# Patient Record
Sex: Male | Born: 1955
Health system: Southern US, Community
[De-identification: ages and names within clinical notes are randomized; demographics above are authoritative.]

## PROBLEM LIST (undated history)

## (undated) DIAGNOSIS — I314 Cardiac tamponade: Secondary | ICD-10-CM

## (undated) DIAGNOSIS — E781 Pure hyperglyceridemia: Secondary | ICD-10-CM

## (undated) DIAGNOSIS — I77810 Thoracic aortic ectasia: Secondary | ICD-10-CM

## (undated) DIAGNOSIS — I2510 Atherosclerotic heart disease of native coronary artery without angina pectoris: Secondary | ICD-10-CM

## (undated) DIAGNOSIS — I10 Essential (primary) hypertension: Secondary | ICD-10-CM

## (undated) DIAGNOSIS — Z87442 Personal history of urinary calculi: Secondary | ICD-10-CM

## (undated) DIAGNOSIS — I351 Nonrheumatic aortic (valve) insufficiency: Secondary | ICD-10-CM

## (undated) DIAGNOSIS — E039 Hypothyroidism, unspecified: Secondary | ICD-10-CM

## (undated) DIAGNOSIS — Q2381 Bicuspid aortic valve: Secondary | ICD-10-CM

## (undated) DIAGNOSIS — T7840XA Allergy, unspecified, initial encounter: Secondary | ICD-10-CM

## (undated) DIAGNOSIS — Z973 Presence of spectacles and contact lenses: Secondary | ICD-10-CM

## (undated) DIAGNOSIS — R011 Cardiac murmur, unspecified: Secondary | ICD-10-CM

## (undated) DIAGNOSIS — Q231 Congenital insufficiency of aortic valve: Secondary | ICD-10-CM

## (undated) DIAGNOSIS — K219 Gastro-esophageal reflux disease without esophagitis: Secondary | ICD-10-CM

## (undated) DIAGNOSIS — E079 Disorder of thyroid, unspecified: Secondary | ICD-10-CM

## (undated) DIAGNOSIS — N400 Enlarged prostate without lower urinary tract symptoms: Secondary | ICD-10-CM

## (undated) HISTORY — DX: Allergy, unspecified, initial encounter: T78.40XA

## (undated) HISTORY — DX: Atherosclerotic heart disease of native coronary artery without angina pectoris: I25.10

## (undated) HISTORY — DX: Essential (primary) hypertension: I10

## (undated) HISTORY — DX: Benign prostatic hyperplasia without lower urinary tract symptoms: N40.0

## (undated) HISTORY — DX: Disorder of thyroid, unspecified: E07.9

## (undated) HISTORY — DX: Congenital insufficiency of aortic valve: Q23.1

## (undated) HISTORY — DX: Thoracic aortic ectasia: I77.810

## (undated) HISTORY — DX: Pure hyperglyceridemia: E78.1

## (undated) HISTORY — DX: Nonrheumatic aortic (valve) insufficiency: I35.1

## (undated) HISTORY — DX: Bicuspid aortic valve: Q23.81

## (undated) HISTORY — PX: EYE SURGERY: SHX253

---
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
## 1995-11-27 HISTORY — PX: VEIN LIGATION AND STRIPPING: SHX2653

## 2004-11-26 HISTORY — PX: CARDIAC CATHETERIZATION: SHX172

## 2004-11-26 HISTORY — PX: CARDIOVASCULAR STRESS TEST: SHX262

## 2004-11-26 HISTORY — PX: CHOLECYSTECTOMY: SHX55

## 2006-12-27 ENCOUNTER — Ambulatory Visit: Payer: Self-pay | Admitting: Internal Medicine

## 2006-12-27 LAB — CONVERTED CEMR LAB
Free T4: 0.6 ng/dL (ref 0.6–1.6)
Glucose, Bld: 86 mg/dL (ref 70–99)
PSA: 0.31 ng/mL (ref 0.10–4.00)
TSH: 8.09 microintl units/mL — ABNORMAL HIGH (ref 0.35–5.50)
Total CHOL/HDL Ratio: 8.5

## 2007-01-23 ENCOUNTER — Ambulatory Visit: Payer: Self-pay | Admitting: Internal Medicine

## 2008-01-12 ENCOUNTER — Encounter (INDEPENDENT_AMBULATORY_CARE_PROVIDER_SITE_OTHER): Payer: Self-pay | Admitting: *Deleted

## 2008-04-14 ENCOUNTER — Ambulatory Visit: Payer: Self-pay | Admitting: Internal Medicine

## 2008-04-14 DIAGNOSIS — E039 Hypothyroidism, unspecified: Secondary | ICD-10-CM | POA: Insufficient documentation

## 2008-04-16 LAB — CONVERTED CEMR LAB
ALT: 24 units/L (ref 0–53)
AST: 30 units/L (ref 0–37)
Albumin: 4.5 g/dL (ref 3.5–5.2)
Basophils Relative: 0.7 % (ref 0.0–1.0)
Calcium: 9.5 mg/dL (ref 8.4–10.5)
Cholesterol: 264 mg/dL (ref 0–200)
Direct LDL: 106.8 mg/dL
Free T4: 0.9 ng/dL (ref 0.6–1.6)
GFR calc Af Amer: 131 mL/min
GFR calc non Af Amer: 108 mL/min
HCT: 44.9 % (ref 39.0–52.0)
Hemoglobin: 15.2 g/dL (ref 13.0–17.0)
Lymphocytes Relative: 37.9 % (ref 12.0–46.0)
Monocytes Absolute: 0.7 10*3/uL (ref 0.1–1.0)
Monocytes Relative: 9.1 % (ref 3.0–12.0)
Neutro Abs: 3.4 10*3/uL (ref 1.4–7.7)
Phosphorus: 3.5 mg/dL (ref 2.3–4.6)
Potassium: 4.1 meq/L (ref 3.5–5.1)
RBC: 4.66 M/uL (ref 4.22–5.81)
RDW: 13.2 % (ref 11.5–14.6)
Sodium: 140 meq/L (ref 135–145)
Total Bilirubin: 0.9 mg/dL (ref 0.3–1.2)
Total CHOL/HDL Ratio: 8.1
Total Protein: 7.7 g/dL (ref 6.0–8.3)

## 2008-08-25 ENCOUNTER — Telehealth: Payer: Self-pay | Admitting: Internal Medicine

## 2009-04-27 ENCOUNTER — Ambulatory Visit: Payer: Self-pay | Admitting: Internal Medicine

## 2009-04-27 DIAGNOSIS — J309 Allergic rhinitis, unspecified: Secondary | ICD-10-CM

## 2009-04-28 LAB — CONVERTED CEMR LAB
Albumin: 4.1 g/dL (ref 3.5–5.2)
Alkaline Phosphatase: 64 units/L (ref 39–117)
BUN: 15 mg/dL (ref 6–23)
Basophils Relative: 0.1 % (ref 0.0–3.0)
Bilirubin, Direct: 0 mg/dL (ref 0.0–0.3)
Calcium: 9.4 mg/dL (ref 8.4–10.5)
Chloride: 112 meq/L (ref 96–112)
Eosinophils Absolute: 0.4 10*3/uL (ref 0.0–0.7)
Eosinophils Relative: 7.8 % — ABNORMAL HIGH (ref 0.0–5.0)
Free T4: 0.9 ng/dL (ref 0.6–1.6)
Glucose, Bld: 89 mg/dL (ref 70–99)
HDL: 29 mg/dL — ABNORMAL LOW (ref >39.00)
Hemoglobin: 14.4 g/dL (ref 13.0–17.0)
MCHC: 33.7 g/dL (ref 30.0–36.0)
MCV: 96 fL (ref 78.0–100.0)
Monocytes Absolute: 0.5 10*3/uL (ref 0.1–1.0)
Neutro Abs: 2.3 10*3/uL (ref 1.4–7.7)
Neutrophils Relative %: 42.5 % — ABNORMAL LOW (ref 43.0–77.0)
Potassium: 4.5 meq/L (ref 3.5–5.1)
RBC: 4.46 M/uL (ref 4.22–5.81)
Total Bilirubin: 1.1 mg/dL (ref 0.3–1.2)
VLDL: 67.8 mg/dL — ABNORMAL HIGH (ref 0.0–40.0)
WBC: 5.4 10*3/uL (ref 4.5–10.5)

## 2009-06-06 ENCOUNTER — Encounter (INDEPENDENT_AMBULATORY_CARE_PROVIDER_SITE_OTHER): Payer: Self-pay | Admitting: *Deleted

## 2010-03-03 ENCOUNTER — Ambulatory Visit: Payer: Self-pay | Admitting: Internal Medicine

## 2010-03-03 DIAGNOSIS — M259 Joint disorder, unspecified: Secondary | ICD-10-CM

## 2010-04-10 ENCOUNTER — Encounter: Payer: Self-pay | Admitting: Internal Medicine

## 2010-05-08 ENCOUNTER — Encounter: Payer: Self-pay | Admitting: Internal Medicine

## 2010-05-09 ENCOUNTER — Ambulatory Visit: Payer: Self-pay | Admitting: Internal Medicine

## 2010-05-10 LAB — CONVERTED CEMR LAB
BUN: 16 mg/dL (ref 6–23)
Basophils Relative: 0.5 % (ref 0.0–3.0)
Bilirubin, Direct: 0.1 mg/dL (ref 0.0–0.3)
Calcium: 8.9 mg/dL (ref 8.4–10.5)
Creatinine, Ser: 0.9 mg/dL (ref 0.4–1.5)
Eosinophils Relative: 6.7 % — ABNORMAL HIGH (ref 0.0–5.0)
HCT: 38.3 % — ABNORMAL LOW (ref 39.0–52.0)
HDL: 29.2 mg/dL — ABNORMAL LOW (ref >39.00)
Hemoglobin: 13.1 g/dL (ref 13.0–17.0)
Lymphs Abs: 2.3 10*3/uL (ref 0.7–4.0)
MCV: 94.1 fL (ref 78.0–100.0)
Monocytes Absolute: 0.5 10*3/uL (ref 0.1–1.0)
Neutro Abs: 2.5 10*3/uL (ref 1.4–7.7)
PSA: 0.41 ng/mL (ref 0.10–4.00)
Phosphorus: 3.8 mg/dL (ref 2.3–4.6)
Potassium: 4.3 meq/L (ref 3.5–5.1)
RBC: 4.07 M/uL — ABNORMAL LOW (ref 4.22–5.81)
Total Bilirubin: 0.7 mg/dL (ref 0.3–1.2)
Total CHOL/HDL Ratio: 6
Triglycerides: 205 mg/dL — ABNORMAL HIGH (ref 0.0–149.0)
WBC: 5.7 10*3/uL (ref 4.5–10.5)

## 2010-08-15 ENCOUNTER — Ambulatory Visit: Payer: Self-pay | Admitting: Internal Medicine

## 2010-08-15 DIAGNOSIS — J4520 Mild intermittent asthma, uncomplicated: Secondary | ICD-10-CM

## 2010-12-26 NOTE — Assessment & Plan Note (Signed)
Summary: RE-ACURRING COUGH AND HEAD COLD/JRR   Vital Signs:  Patient profile:   55 year old male Weight:      154 pounds Temp:     98.4 degrees F oral Pulse rate:   52 / minute Pulse rhythm:   regular Resp:     12 per minute BP sitting:   122 / 70  (left arm) Cuff size:   large  Vitals Entered By: Mervin Hack CMA Duncan Dull) (August 15, 2010 10:27 AM) CC: cough   History of Present Illness: Had cold about 2 weeks ago exacerbated asthma he used to have in the past still has the cold "stuck" mostly at night coughing all night has tried delsym and albuterol inhaler  seems to be susceptible in fall every year was hospitalized in this season  ~20 years ago ? waling pneumonia then  No fever No sig sweats or chills at night No sig SOB  Antihistamines have never helped him  Allergies: 1)  ! Tetracycline Hcl (Tetracycline Hcl)  Past History:  Past medical, surgical, family and social histories (including risk factors) reviewed for relevance to current acute and chronic problems.  Past Medical History: Hyperlipidemia Hypothyroidism Allergic rhinitis Asthma--mostly in childhood  Past Surgical History: Reviewed history from 04/14/2008 and no changes required.  ~1997 Vein stripping 2006 Cholecystectomy 2006 Stress test and cath [facility])  Family History: Reviewed history from 04/14/2008 and no changes required. Dad died @79  Alcoholism?, prostate cancer Mom is alcoholic 1 brother, 1 sister Malen Gauze ?liver cancer No CAD, HTN, DM No colon cancer  Social History: Reviewed history from 04/14/2008 and no changes required. 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   Ears:  R ear normal and L ear normal.     Nose:  moderate pale congestion on right Mouth:  no erythema, no exudates, and no lesions.   Neck:  supple, no masses, and no cervical lymphadenopathy.   Lungs:  normal respiratory effort, no intercostal retractions, no accessory muscle use, normal breath sounds, no dullness, no crackles, and no wheezes.     Impression & Recommendations:  Problem # 1:  COUGH (ICD-786.2) Assessment New seems to be the remnants of mild exacerbation of asthma with his cold CXR looks fine no evidence of ongoing infection so no antibiotics needed will treat with prednisone consider inhaled steroid if symtoms persist  Orders: Spirometry w/Graph (94010) T-2 View CXR (71020TC)  Problem # 2:  ASTHMA (ICD-493.90) Assessment: Comment Only still mild and intermittent this is first exacerbation in some time His updated medication list for this problem includes:    Proair Hfa 108 (90 Base) Mcg/act Aers (Albuterol sulfate) .Marland Kitchen... 2 puffs three times a day as needed for asthma flare    Prednisone 20 Mg Tabs (Prednisone) .Marland Kitchen... 2 tabs daily for 5 days then 1 tab daily for 5 days for asthma  Complete Medication List: 1)  Levothyroxine Sodium 125 Mcg Tabs (Levothyroxine sodium) .... Take one by mouth daily 2)  Proair Hfa 108 (90 Base) Mcg/act Aers (Albuterol sulfate) .... 2 puffs three times a day as needed for asthma flare 3)  Prednisone 20 Mg Tabs (Prednisone) .... 2 tabs daily for 5 days then 1 tab daily for 5 days for asthma 4)  Tramadol Hcl 50  Mg Tabs (Tramadol hcl) .Marland Kitchen.. 1 tab at  bedtime as needed for cough  Patient Instructions: 1)  Please schedule a follow-up appointment as needed .  Prescriptions: TRAMADOL HCL 50 MG TABS (TRAMADOL HCL) 1 tab at  bedtime as needed for cough  #30 x 0   Entered and Authorized by:   Cindee Salt MD   Signed by:   Cindee Salt MD on 08/15/2010   Method used:   Electronically to        [facility]* (retail)       [address]       [place], [place]   [address]       Ph: [phone]       Fax: [phone]   RxID:   [id number] PREDNISONE 20 MG TABS (PREDNISONE) 2 tabs daily for 5 days then 1 tab daily for 5 days for asthma  #15 x 0   Entered and Authorized by:   Cindee Salt MD   Signed by:   Cindee Salt MD on 08/15/2010   Method used:   Electronically to        [facility]* (retail)       [address]       [place], [place]  [address]       Ph: [phone]       Fax: [phone]   RxID:   [id number] PROAIR HFA 108 (90 BASE) MCG/ACT AERS (ALBUTEROL SULFATE) 2 puffs three times a day as needed for asthma flare  #1 x 1   Entered and Authorized by:   Cindee Salt MD   Signed by:   Cindee Salt MD on 08/15/2010   Method used:   Electronically to        [facility]* (retail)       [address]       [place], [place]  [address]       Ph: [phone]       Fax: [phone]   RxID:   [id number]   Current Allergies (reviewed today): ! TETRACYCLINE HCL (TETRACYCLINE HCL)

## 2010-12-26 NOTE — Consult Note (Signed)
Summary: [facility]   Imported By: Lanelle Bal 04/21/2010 11:48:03  _____________________________________________________________________  External Attachment:    Type:   Image     Comment:   External Document  Appended Document: [facility] inclusion cyst right foot pain resolved so holding off on surgery

## 2010-12-26 NOTE — Assessment & Plan Note (Signed)
Summary: CPX / LFW   Vital Signs:  Patient profile:   55 year old male Weight:      161 pounds Temp:     98 degrees F oral Pulse rate:   56 / minute Pulse rhythm:   regular BP sitting:   128 / 70  (left arm) Cuff size:   large  Vitals Entered By: Mervin Hack CMA Duncan Dull) (May 09, 2010 9:28 AM) CC: adult physical   History of Present Illness: Saw podiatrist about his foot just a cyst No symptoms so just observing  Occ notes some night sweats Wife notices, he doesn't really notice Enjoys the hot weather--no heat intolerance    Allergies: 1)  ! Tetracycline Hcl (Tetracycline Hcl)  Past History:  Past medical, surgical, family and social histories (including risk factors) reviewed for relevance to current acute and chronic problems.  Past Medical History: Reviewed history from 04/27/2009 and no changes required. Hyperlipidemia Hypothyroidism Allergic rhinitis  Past Surgical History: Reviewed history from 04/14/2008 and no changes required.  ~1997 Vein stripping 2006 Cholecystectomy 2006 Stress test and cath [facility])  Family History: Reviewed history from 04/14/2008 and no changes required. Dad died @79  Alcoholism?, prostate cancer Mom is alcoholic 1 brother, 1 sister Malen Gauze ?liver cancer No CAD, HTN, DM No colon cancer  Social History: Reviewed history from 04/14/2008 and no changes required. 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 Eyes:  Denies double vision and vision loss-1 eye; just had eye exam wtih Dr Leonarda Salon. ENT:  Complains of decreased hearing; denies ringing in ears; mild high freq hearing loss teeth oare okay--keeps up with dentist. Has gotten some gum grafts. CV:  Denies chest  pain or discomfort, difficulty breathing at night, difficulty breathing while lying down, fainting, lightheadness, palpitations, and shortness of breath with exertion. Resp:  Denies cough and shortness of breath. GI:  Denies abdominal pain, bloody stools, change in bowel habits, dark tarry stools, indigestion, nausea, and vomiting. GU:  Denies erectile dysfunction, urinary frequency, and urinary hesitancy. MS:  Denies joint pain and joint swelling. Derm:  Denies lesion(s) and rash. Neuro:  Denies headaches, numbness, tingling, and weakness. Psych:  Denies anxiety and depression. Heme:  Denies abnormal bruising and enlarge lymph nodes. Allergy:  Complains of seasonal allergies and sneezing; mild symtpoms--no meds.  Physical Exam  General:  alert and normal appearance.   Eyes:  pupils equal, pupils round, pupils reactive to light, and no optic disk abnormalities.   Ears:  R ear normal and L ear normal.   Mouth:  no erythema, no exudates, and no lesions.   Neck:  supple, no masses, no thyromegaly, no carotid bruits, and no cervical lymphadenopathy.   Lungs:  normal respiratory effort and normal breath sounds.   Heart:  normal rate, regular rhythm, and no gallop.   Soft systolic murmur at apex---MR Abdomen:  soft, non-tender, and no masses.   Rectal:  no masses.  SLight hemorrhoid--not inflamed Prostate:  no gland enlargement and no nodules.   Msk:  no joint tenderness and no joint swelling.   Pulses:  normal in feet Extremities:  no edema Neurologic:  alert & oriented X3, strength normal in all extremities, and gait normal.   Skin:  no rashes and no suspicious lesions.   Axillary  Nodes:  No palpable lymphadenopathy Psych:  normally interactive, good eye contact, not anxious appearing, and not depressed appearing.     Impression & Recommendations:  Problem # 1:  PREVENTIVE HEALTH CARE (ICD-V70.0) Assessment Comment Only healthy will check PSA  Problem # 2:  CARDIAC MURMUR  (ICD-785.2) Assessment: New  sounds like mild MR no symptoms Normal LA size on EKG  P:  no tests now     consider echo if symptoms or murmur gets more prominent  Orders: EKG w/ Interpretation (93000) TLB-CBC Platelet - w/Differential (85025-CBCD) TLB-Renal Function Panel (80069-RENAL)  Problem # 3:  HYPOTHYROIDISM (ICD-244.9) Assessment: Comment Only  due for labs  His updated medication list for this problem includes:    Levothyroxine Sodium 125 Mcg Tabs (Levothyroxine sodium) .Marland Kitchen... Take one by mouth daily  Labs Reviewed: TSH: 6.10 (04/27/2009)    Chol: 207 (04/27/2009)   HDL: 29.00 (04/27/2009)   LDL: DEL (04/14/2008)   TG: 339.0 (04/27/2009)  Orders: TLB-TSH (Thyroid Stimulating Hormone) (84443-TSH) TLB-T4 (Thyrox), Free [phone])  Complete Medication List: 1)  Levothyroxine Sodium 125 Mcg Tabs (Levothyroxine sodium) .... Take one by mouth daily  Other Orders: TLB-Lipid Panel (80061-LIPID) TLB-Hepatic/Liver Function Pnl (80076-HEPATIC) Venipuncture (84166) TLB-PSA (Prostate Specific Antigen) (84153-PSA)  Patient Instructions: 1)  Please schedule a follow-up appointment in 1 year.  Prescriptions: LEVOTHYROXINE SODIUM 125 MCG TABS (LEVOTHYROXINE SODIUM) take one by mouth daily  #90 x 3   Entered by:   Mervin Hack CMA (AAMA)   Authorized by:   Cindee Salt MD   Signed by:   Mervin Hack CMA (AAMA) on 05/09/2010   Method used:   Electronically to        [facility]* (retail)       [address]       [place], [place]  [address]       Ph: [phone]       Fax: [phone]   RxID:   [id number]   Current Allergies (reviewed today): ! TETRACYCLINE HCL (TETRACYCLINE HCL)    EKG  Procedure date:  05/09/2010  Findings:      sinus @ 48 Normal other than bradycardia

## 2010-12-26 NOTE — Assessment & Plan Note (Signed)
Summary: CHECK GROWTH ON R FOOT/CLE   Vital Signs:  Patient profile:   55 year old male Weight:      161 pounds BMI:     25.31 Temp:     97.8 degrees F oral Pulse rate:   60 / minute Pulse rhythm:   regular BP sitting:   128 / 68  (left arm) Cuff size:   large  Vitals Entered By: Mervin Hack CMA Duncan Dull) (March 03, 2010 10:15 AM) CC: growth on bottom of right foot   History of Present Illness: Has a sore under his right foot Felt a bump notices it when walking --it is sore Noted it about 1 week ago  no injury no Rx to date  Allergies: 1)  ! Tetracycline Hcl (Tetracycline Hcl)  Past History:  Past medical, surgical, family and social histories (including risk factors) reviewed for relevance to current acute and chronic problems.  Past Medical History: Reviewed history from 04/27/2009 and no changes required. Hyperlipidemia Hypothyroidism Allergic rhinitis  Past Surgical History: Reviewed history from 04/14/2008 and no changes required.  ~1997 Vein stripping 2006 Cholecystectomy 2006 Stress test and cath [facility])  Family History: Reviewed history from 04/14/2008 and no changes required. Dad died @79  Alcoholism?, prostate cancer Mom is alcoholic 1 brother, 1 sister Malen Gauze ?liver cancer No CAD, HTN, DM No colon cancer  Social History: Reviewed history from 04/14/2008 and no changes required. 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  NAD Msk:  small discrete subcutaneous nodule laterally in the right mid foot no warts mild tenderness there without inflammation   Impression & Recommendations:  Problem # 1:  OTHER SYMPTOMS REFERABLE TO ANKLE AND FOOT JOINT (ICD-719.67) Assessment New  small mass that may be cyst but  not clear if it could be some type of tumor doubt anything malignant in any case but given his concerns, will proceed with podiatry consult  Orders: Podiatry Referral (Podiatry)  Complete Medication List: 1)  Levothyroxine Sodium 125 Mcg Tabs (Levothyroxine sodium) .... Take one by mouth daily  Patient Instructions: 1)  Please keep appt for physical 2)  Referral Appointment Information 3)  Day/Date: 4)  Time: 5)  Place/MD: 6)  Address: 7)  Phone/Fax: 8)  Patient given appointment information. Information/Orders faxed/mailed.  Current Allergies (reviewed today): ! TETRACYCLINE HCL (TETRACYCLINE HCL)

## 2011-02-13 LAB — PULMONARY FUNCTION TEST

## 2011-02-14 ENCOUNTER — Encounter: Payer: Self-pay | Admitting: Internal Medicine

## 2011-05-25 ENCOUNTER — Encounter: Payer: Self-pay | Admitting: Internal Medicine

## 2011-05-25 ENCOUNTER — Ambulatory Visit (INDEPENDENT_AMBULATORY_CARE_PROVIDER_SITE_OTHER): Payer: BC Managed Care – PPO | Admitting: Internal Medicine

## 2011-05-25 VITALS — BP 128/68 | HR 64 | Temp 98.5°F | Ht 66.0 in | Wt 153.0 lb

## 2011-05-25 DIAGNOSIS — E785 Hyperlipidemia, unspecified: Secondary | ICD-10-CM

## 2011-05-25 DIAGNOSIS — J45909 Unspecified asthma, uncomplicated: Secondary | ICD-10-CM

## 2011-05-25 DIAGNOSIS — Z119 Encounter for screening for infectious and parasitic diseases, unspecified: Secondary | ICD-10-CM | POA: Insufficient documentation

## 2011-05-25 DIAGNOSIS — Z0000 Encounter for general adult medical examination without abnormal findings: Secondary | ICD-10-CM

## 2011-05-25 DIAGNOSIS — R011 Cardiac murmur, unspecified: Secondary | ICD-10-CM

## 2011-05-25 LAB — HEPATIC FUNCTION PANEL
Albumin: 4.7 g/dL (ref 3.5–5.2)
Alkaline Phosphatase: 75 U/L (ref 39–117)
Indirect Bilirubin: 0.6 mg/dL (ref 0.0–0.9)
Total Bilirubin: 0.8 mg/dL (ref 0.3–1.2)
Total Protein: 7.6 g/dL (ref 6.0–8.3)

## 2011-05-25 LAB — LIPID PANEL
Cholesterol: 173 mg/dL (ref 0–200)
HDL: 29 mg/dL — ABNORMAL LOW (ref >39)
LDL Cholesterol: 77 mg/dL (ref 0–99)
Total CHOL/HDL Ratio: 6 Ratio
Triglycerides: 333 mg/dL — ABNORMAL HIGH (ref <150)
VLDL: 67 mg/dL — ABNORMAL HIGH (ref 0–40)

## 2011-05-25 LAB — TSH: TSH: 0.341 u[IU]/mL — ABNORMAL LOW (ref 0.350–4.500)

## 2011-05-25 LAB — BASIC METABOLIC PANEL
BUN: 13 mg/dL (ref 6–23)
Calcium: 9.8 mg/dL (ref 8.4–10.5)
Creat: 0.81 mg/dL (ref 0.50–1.35)

## 2011-05-25 NOTE — Assessment & Plan Note (Addendum)
Generally healthy counselled on fitness Discussed CRP at his urging---I recommended against checking Will check PSA after discussion  Colonoscopy next year

## 2011-05-25 NOTE — Patient Instructions (Signed)
Please set up echocardiogram

## 2011-05-25 NOTE — Assessment & Plan Note (Signed)
Still sounds like MR More prominent so will set up echo

## 2011-05-25 NOTE — Assessment & Plan Note (Signed)
Mild and very intermittent No regular symptoms and only uses rescue inhaler once a month No controller meds after discussion

## 2011-05-25 NOTE — Progress Notes (Signed)
  Subjective:    Patient ID: Jeffrey Tucker, male    DOB: 03/02/1956, 55 y.o.   MRN: [id number]  HPI Has been doing okay in general Had PFT's in [place] Doctor there recommended taking inhaler steroid (her sister in Social worker) He only uses the rescue inhaler about once a month--if the weather is bad Tries to walk regularly--no limitations No persistent cough Occ slight "wheeze" in his throat  Some slowing of urine stream Has to push more Only occ nocturia No daytime symptoms  Has had some soreness in right calf above Achilles Has lasted a couple of months  Review of Systems  Constitutional: Negative for fatigue and unexpected weight change.       Lost 7# in the past year Wears seat belt  HENT: Positive for congestion and rhinorrhea. Negative for hearing loss, dental problem and tinnitus.        Mild allergy symptoms Regular with dentist  Eyes: Negative for visual disturbance.       No diplopia or focal vision loss  Respiratory: Negative for cough, chest tightness and shortness of breath.   Cardiovascular: Negative for chest pain, palpitations and leg swelling.  Gastrointestinal: Negative for nausea, vomiting, constipation and blood in stool.       No heartburn  Genitourinary: Positive for difficulty urinating. Negative for dysuria, urgency and frequency.       No sexual problems  Musculoskeletal: Negative for back pain, joint swelling and arthralgias.  Skin: Negative for rash.       No suspicious lesions  Neurological: Negative for dizziness, syncope, weakness, light-headedness, numbness and headaches.  Hematological: Negative for adenopathy. Does not bruise/bleed easily.  Psychiatric/Behavioral: Negative for sleep disturbance and dysphoric mood. The patient is not nervous/anxious.        Objective:   Physical Exam  Constitutional: He is oriented to person, place, and time. He appears well-developed and well-nourished. No distress.  HENT:  Head: Normocephalic and atraumatic.    Right Ear: External ear normal.  Left Ear: External ear normal.  Mouth/Throat: Oropharynx is clear and moist. No oropharyngeal exudate.       TMs normal  Eyes: Conjunctivae and EOM are normal. Pupils are equal, round, and reactive to light.  Neck: Normal range of motion. Neck supple. No thyromegaly present.  Cardiovascular: Normal rate, regular rhythm and intact distal pulses.  Exam reveals no gallop.   Murmur heard.      Gr 3/6 systolic murmur at apex  Pulmonary/Chest: Effort normal and breath sounds normal. No respiratory distress. He has no wheezes. He has no rales.  Abdominal: Soft. He exhibits no mass. There is no tenderness.  Musculoskeletal: Normal range of motion. He exhibits no edema and no tenderness.  Lymphadenopathy:    He has no cervical adenopathy.  Neurological: He is alert and oriented to person, place, and time. He exhibits normal muscle tone.       Normal gait and strength  Skin: Skin is warm. No rash noted.  Psychiatric: He has a normal mood and affect. His behavior is normal. Judgment and thought content normal.          Assessment & Plan:

## 2011-05-26 LAB — CBC WITH DIFFERENTIAL/PLATELET
Basophils Relative: 0 % (ref 0–1)
Hemoglobin: 14.9 g/dL (ref 13.0–17.0)
Lymphs Abs: 2.8 10*3/uL (ref 0.7–4.0)
Monocytes Relative: 6 % (ref 3–12)
Neutro Abs: 6.7 10*3/uL (ref 1.7–7.7)
Neutrophils Relative %: 62 % (ref 43–77)
Platelets: 216 10*3/uL (ref 150–400)
RBC: 4.72 MIL/uL (ref 4.22–5.81)
WBC: 10.9 10*3/uL — ABNORMAL HIGH (ref 4.0–10.5)

## 2011-05-29 ENCOUNTER — Encounter: Payer: Self-pay | Admitting: *Deleted

## 2011-06-01 ENCOUNTER — Encounter: Payer: Self-pay | Admitting: *Deleted

## 2011-06-06 ENCOUNTER — Ambulatory Visit: Payer: BC Managed Care – PPO | Admitting: Cardiology

## 2011-06-08 ENCOUNTER — Other Ambulatory Visit (INDEPENDENT_AMBULATORY_CARE_PROVIDER_SITE_OTHER): Payer: BC Managed Care – PPO | Admitting: Internal Medicine

## 2011-06-08 DIAGNOSIS — E039 Hypothyroidism, unspecified: Secondary | ICD-10-CM

## 2011-06-08 LAB — T4, FREE: Free T4: 0.84 ng/dL (ref 0.60–1.60)

## 2011-06-11 ENCOUNTER — Telehealth: Payer: Self-pay | Admitting: *Deleted

## 2011-06-11 NOTE — Telephone Encounter (Signed)
Spoke with patient and advised results   

## 2011-06-11 NOTE — Telephone Encounter (Signed)
Pt is asking about the results of his recent lab work.  He is asking why his white count would have doubled in the last year, does that signal infection somewhere?  If not, then why the increase?  Please advise.

## 2011-06-11 NOTE — Telephone Encounter (Signed)
Please let him know that white blood counts can change from day to day and respond to fairly mild things. His mildly elevated number this year could just be lab variation or some minor condition (for example a cold or ingrown toenail could increase the blood count a little)

## 2011-06-12 ENCOUNTER — Telehealth: Payer: Self-pay | Admitting: *Deleted

## 2011-06-12 ENCOUNTER — Other Ambulatory Visit: Payer: BC Managed Care – PPO | Admitting: *Deleted

## 2011-06-12 MED ORDER — LEVOTHYROXINE SODIUM 125 MCG PO TABS: 125.0000 ug | ORAL_TABLET | Freq: Every day | ORAL | Status: DC

## 2011-06-12 NOTE — Telephone Encounter (Signed)
Message copied by Sueanne Margarita on Tue Jun 12, 2011  9:26 AM ------      Message from: Tillman Abide I      Created: Sat Jun 09, 2011  3:46 PM       Please call      The level of actual thyroid hormone is fine      No change in meds is needed

## 2011-06-12 NOTE — Telephone Encounter (Signed)
Spoke with patient and advised results rx sent to pharmacy by e-script  

## 2011-06-14 ENCOUNTER — Ambulatory Visit: Payer: BC Managed Care – PPO | Admitting: Cardiovascular Disease

## 2011-06-29 ENCOUNTER — Other Ambulatory Visit (INDEPENDENT_AMBULATORY_CARE_PROVIDER_SITE_OTHER): Payer: BC Managed Care – PPO | Admitting: *Deleted

## 2011-06-29 DIAGNOSIS — I359 Nonrheumatic aortic valve disorder, unspecified: Secondary | ICD-10-CM

## 2011-06-29 DIAGNOSIS — R011 Cardiac murmur, unspecified: Secondary | ICD-10-CM

## 2011-06-29 DIAGNOSIS — Q231 Congenital insufficiency of aortic valve: Secondary | ICD-10-CM

## 2011-07-02 DIAGNOSIS — Q231 Congenital insufficiency of aortic valve: Secondary | ICD-10-CM | POA: Insufficient documentation

## 2011-07-02 NOTE — Assessment & Plan Note (Signed)
Dr Mariah Milling informed me of apparent bicuspid aortic valve and sig regurgitation with some aortic root dilatation While only follow up is recommended, I spoke to patient and recommended referral for discussion of the findings and to set up follow up plans

## 2011-07-10 ENCOUNTER — Other Ambulatory Visit: Payer: Self-pay | Admitting: Internal Medicine

## 2011-07-10 DIAGNOSIS — Q231 Congenital insufficiency of aortic valve: Secondary | ICD-10-CM

## 2011-07-18 ENCOUNTER — Ambulatory Visit: Payer: BC Managed Care – PPO | Admitting: Cardiovascular Disease

## 2011-07-19 ENCOUNTER — Ambulatory Visit: Payer: BC Managed Care – PPO | Admitting: Cardiovascular Disease

## 2011-07-19 ENCOUNTER — Encounter: Payer: Self-pay | Admitting: Cardiovascular Disease

## 2011-07-19 ENCOUNTER — Ambulatory Visit (INDEPENDENT_AMBULATORY_CARE_PROVIDER_SITE_OTHER): Payer: BC Managed Care – PPO | Admitting: Cardiovascular Disease

## 2011-07-19 VITALS — BP 160/69 | HR 49 | Ht 66.0 in | Wt 162.0 lb

## 2011-07-19 DIAGNOSIS — E785 Hyperlipidemia, unspecified: Secondary | ICD-10-CM

## 2011-07-19 DIAGNOSIS — Q231 Congenital insufficiency of aortic valve: Secondary | ICD-10-CM

## 2011-07-19 DIAGNOSIS — I351 Nonrheumatic aortic (valve) insufficiency: Secondary | ICD-10-CM

## 2011-07-19 DIAGNOSIS — I359 Nonrheumatic aortic valve disorder, unspecified: Secondary | ICD-10-CM

## 2011-07-19 DIAGNOSIS — Q2381 Bicuspid aortic valve: Secondary | ICD-10-CM

## 2011-07-19 NOTE — Patient Instructions (Addendum)
You are doing well. Keep working on the weight. We should recheck the cholesterol when your weight is 150.  We will schedule an echo prior to a visit in one year. 06/2012 If you have increasing shortness of breath or swelling in the legs, please call the office.  Please call us if you have new issues that need to be addressed before your next appt.  We will call you for a follow up Appt. In 12 months

## 2011-07-20 DIAGNOSIS — I351 Nonrheumatic aortic (valve) insufficiency: Secondary | ICD-10-CM | POA: Insufficient documentation

## 2011-07-20 NOTE — Assessment & Plan Note (Signed)
He does appear to have the trio of disorders including significant aortic valve insufficiency, dilated aortic root and ascending aorta, possible bicuspid aortic valve. We did discuss each one of these and show him the actual pictures on echocardiography. We reviewed several of the images to show him the regurgitation, location of the valve, dilation of the aorta. He now has a better sense of his anatomy.  Options are several and include possible TEE to further evaluate his aortic valve. He preferred to hold on this imaging study at this time. The TEE would likely not change management given that we would monitor his valve disease on an annual basis at this time.  We have suggested an echocardiogram in one year. We will be monitoring the left ventricular size and function for changes as a guide to when to proceed with further workup including TEE and possible surgery for repair. The ascending aorta is not dilated enough to warrant surgical intervention at this time and he has no symptoms from his aortic valve regurgitation.  Did suggest that he closely monitor her himself or edema, weight gain/fluid retention. If he has worsening short of breath or weight gain, we could start low-dose diuretic.

## 2011-07-20 NOTE — Progress Notes (Signed)
Patient ID: Jeffrey Tucker, male    DOB: 13-Apr-1956, 55 y.o.   MRN: [id number]  HPI Comments: [name] is a very pleasant 55 year old gentleman, patient of Dr. Alphonsus Sias, with a history of asthma, who presents after a murmur was appreciated an echocardiogram was performed. He has significant aortic valve regurgitation, mild to moderately dilated aortic root and ascending aorta, possible bicuspid aortic valve.  He reports that he is relatively asymptomatic. He denies any significant shortness of breath, edema, lightheadedness or dizziness. No chest tightness with exertion. He did not know that there was anything wrong until the echocardiogram report came back. He is active at baseline and would like to be proactive with his medical health.  Echocardiogram showed ejection fraction 50-55%, left ventricle end diastolic size was 52 mm, mild LVH, mild MR, mild to moderately dilated aortic root and ascending aorta. Aortic valve was not well visualized though bicuspid valve could not be excluded. Select images suggest bicuspid aortic valve. Severe aortic valve regurgitation noted though again, jet is eccentric and difficult to determine whether this is  moderate to severe or severe.  EKG shows sinus bradycardia with rate 49 beats per minute, voltage consistent with LVH, no other significant ST or T wave changes   Outpatient Encounter Prescriptions as of 07/19/2011  Medication Sig Dispense Refill  . albuterol (PROAIR HFA) 108 (90 BASE) MCG/ACT inhaler Inhale 2 puffs into the lungs 3 (three) times daily as needed. Asthma flare up       . levothyroxine (SYNTHROID, LEVOTHROID) 125 MCG tablet Take 1 tablet (125 mcg total) by mouth daily.  90 tablet  3     Review of Systems  Constitutional: Negative.   HENT: Negative.   Eyes: Negative.   Respiratory: Negative.   Cardiovascular: Negative.   Gastrointestinal: Negative.   Musculoskeletal: Negative.   Skin: Negative.   Neurological: Negative.   Hematological:  Negative.   Psychiatric/Behavioral: Negative.   All other systems reviewed and are negative.    BP 160/69  Pulse 49  Ht 5\' 6"  (1.676 m)  Wt 162 lb (73.483 kg)  BMI 26.15 kg/m2 Repeat blood pressure was improved at 140/70 Physical Exam  Nursing note and vitals reviewed. Constitutional: He is oriented to person, place, and time. He appears well-developed and well-nourished.  HENT:  Head: Normocephalic.  Nose: Nose normal.  Mouth/Throat: Oropharynx is clear and moist.  Eyes: Conjunctivae are normal. Pupils are equal, round, and reactive to light.  Neck: Normal range of motion. Neck supple. No JVD present.  Cardiovascular: Normal rate, regular rhythm, S1 normal, S2 normal and intact distal pulses.  Exam reveals no gallop and no friction rub.   Murmur heard.  No systolic murmur is present   Decrescendo diastolic murmur is present with a grade of 2/6  Pulmonary/Chest: Effort normal and breath sounds normal. No respiratory distress. He has no wheezes. He has no rales. He exhibits no tenderness.  Abdominal: Soft. Bowel sounds are normal. He exhibits no distension. There is no tenderness.  Musculoskeletal: Normal range of motion. He exhibits no edema and no tenderness.  Lymphadenopathy:    He has no cervical adenopathy.  Neurological: He is alert and oriented to person, place, and time. Coordination normal.  Skin: Skin is warm and dry. No rash noted. No erythema.  Psychiatric: He has a normal mood and affect. His behavior is normal. Judgment and thought content normal.           Assessment and Plan

## 2011-07-20 NOTE — Assessment & Plan Note (Signed)
We spent a significant time talking about his cholesterol. Several years ago, his cholesterol was very elevated. Currently after weight loss and improved diet his cholesterol is much better. We have encouraged him to continue what he is doing with weight loss and exercise.

## 2012-05-26 ENCOUNTER — Ambulatory Visit (INDEPENDENT_AMBULATORY_CARE_PROVIDER_SITE_OTHER): Payer: BC Managed Care – PPO | Admitting: Internal Medicine

## 2012-05-26 ENCOUNTER — Encounter: Payer: Self-pay | Admitting: Internal Medicine

## 2012-05-26 VITALS — BP 138/60 | HR 52 | Temp 97.6°F | Ht 66.0 in | Wt 150.0 lb

## 2012-05-26 DIAGNOSIS — E785 Hyperlipidemia, unspecified: Secondary | ICD-10-CM

## 2012-05-26 DIAGNOSIS — Z0000 Encounter for general adult medical examination without abnormal findings: Secondary | ICD-10-CM

## 2012-05-26 DIAGNOSIS — J45909 Unspecified asthma, uncomplicated: Secondary | ICD-10-CM

## 2012-05-26 DIAGNOSIS — Q2381 Bicuspid aortic valve: Secondary | ICD-10-CM

## 2012-05-26 DIAGNOSIS — Q231 Congenital insufficiency of aortic valve: Secondary | ICD-10-CM

## 2012-05-26 DIAGNOSIS — E039 Hypothyroidism, unspecified: Secondary | ICD-10-CM

## 2012-05-26 DIAGNOSIS — Z1211 Encounter for screening for malignant neoplasm of colon: Secondary | ICD-10-CM

## 2012-05-26 NOTE — Progress Notes (Signed)
Subjective:    Patient ID: Jeffrey Tucker, male    DOB: 1956/01/15, 56 y.o.   MRN: [id number]  HPI Here for physical Same job, nothing new in [place]  Has been working hard on fitness Weight is down 12# Plans to lose what is left of his abdomen---expects another 10# to lose  Asthma is quiet Uses the albuterol if the weather is humid, soggy (and more in fall) Uses under once a month on average  Has follow up with Dr Mariah Milling and echo scheduled  Current Outpatient Prescriptions on File Prior to Visit  Medication Sig Dispense Refill  . albuterol (PROAIR HFA) 108 (90 BASE) MCG/ACT inhaler Inhale 2 puffs into the lungs 3 (three) times daily as needed. Asthma flare up       . levothyroxine (SYNTHROID, LEVOTHROID) 125 MCG tablet Take 1 tablet (125 mcg total) by mouth daily.  90 tablet  3    Allergies  Allergen Reactions  . Tetracycline Hcl     REACTION: joint pain    Past Medical History  Diagnosis Date  . Hyperlipidemia   . Thyroid disease     hypothyroidism  . Allergy     allergic rhinitis  . Asthma     mostly in childhood    Past Surgical History  Procedure Date  . Vein ligation and stripping 1997  . Cholecystectomy 2006  . Cardiac catheterization 2006    [place]  . Cardiovascular stress test 2006    [place]    Family History  Problem Relation Age of Onset  . Alcohol abuse Father   . Prostate cancer Father   . Alcohol abuse Mother   . Liver cancer Paternal Grandmother   . Colon cancer Neg Hx   . Coronary artery disease Neg Hx   . Hypertension Neg Hx   . Diabetes Neg Hx     History   Social History  . Marital Status: Married    Spouse Name: Caio Devera    Number of Children: 2  . 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 Smoking status: Former Smoker    Quit date: 11/26/1978  . Smokeless tobacco: Never Used   Comment: social smoked till 1980's  .  Alcohol Use: No     none since 1984  . Drug Use: No  . Sexually Active: Not on file   Other Topics Concern  . Not on file   Social History Narrative  . No narrative on file   Review of Systems  Constitutional: Negative for fatigue and unexpected weight change.       Wears seat belt  HENT: Positive for hearing loss, congestion and rhinorrhea. Negative for dental problem and tinnitus.        Mild high frequency hearing loss Mild allergies in spring--not enough to use meds. Tries local honey and believes it has helped Regular with dentist  Eyes: Negative for visual disturbance.       No diplopia or unilateral vision loss  Respiratory: Negative for cough, chest tightness and shortness of breath.   Cardiovascular: Negative for chest pain, palpitations and leg swelling.  Gastrointestinal: Positive for blood in stool. Negative for nausea, vomiting, abdominal pain and constipation.       No heartburn Occ blood on toilet paper  Genitourinary: Negative for urgency and difficulty urinating.       Stream is slower--has to use more  pressure Nocturia x 1 No sexual problems  Musculoskeletal: Negative for back pain, joint swelling and arthralgias.  Skin: Negative for rash.       No suspicious lesions  Neurological: Negative for dizziness, syncope, weakness, light-headedness, numbness and headaches.  Hematological: Negative for adenopathy. Does not bruise/bleed easily.  Psychiatric/Behavioral: Negative for disturbed wake/sleep cycle and dysphoric mood. The patient is not nervous/anxious.        Objective:   Physical Exam  Constitutional: He is oriented to person, place, and time. He appears well-developed and well-nourished. No distress.  HENT:  Head: Normocephalic and atraumatic.  Right Ear: External ear normal.  Left Ear: External ear normal.  Mouth/Throat: Oropharynx is clear and moist.  Eyes: Conjunctivae and EOM are normal. Pupils are equal, round, and reactive to light.  Neck:  Normal range of motion. Neck supple. No thyromegaly present.  Cardiovascular: Normal rate and intact distal pulses.  Exam reveals no gallop.   Murmur heard.      Grade 3/6 systolic murmur at aortic area and apex. Not into carotid Gr 2/6 early diastolic murmur in aortic area  Pulmonary/Chest: Effort normal and breath sounds normal. No respiratory distress. He has no wheezes. He has no rales.  Abdominal: Soft. There is no tenderness.  Musculoskeletal: He exhibits no edema and no tenderness.  Lymphadenopathy:    He has no cervical adenopathy.  Neurological: He is alert and oriented to person, place, and time.  Skin: No rash noted. No erythema.  Psychiatric: He has a normal mood and affect. His behavior is normal. Thought content normal.          Assessment & Plan:

## 2012-05-26 NOTE — Assessment & Plan Note (Signed)
Will check labs

## 2012-05-26 NOTE — Assessment & Plan Note (Signed)
Will recheck elevated triglycerides after weight loss

## 2012-05-26 NOTE — Assessment & Plan Note (Signed)
Follows with Dr Mariah Milling

## 2012-05-26 NOTE — Assessment & Plan Note (Signed)
Mild and very intermittent No regular Rx indicated

## 2012-05-26 NOTE — Assessment & Plan Note (Signed)
Healthy Really has worked on fitness Due for colonoscopy--will send to [facility] Will defer PSA till last year since only 0.37 last year

## 2012-05-27 LAB — CBC WITH DIFFERENTIAL/PLATELET
Basophils Absolute: 0 10*3/uL (ref 0.0–0.1)
Lymphocytes Relative: 32.6 % (ref 12.0–46.0)
Lymphs Abs: 2.8 10*3/uL (ref 0.7–4.0)
Monocytes Relative: 7.1 % (ref 3.0–12.0)
Neutrophils Relative %: 57.4 % (ref 43.0–77.0)
Platelets: 215 10*3/uL (ref 150.0–400.0)
RDW: 13.7 % (ref 11.5–14.6)

## 2012-05-27 LAB — BASIC METABOLIC PANEL
BUN: 15 mg/dL (ref 6–23)
Calcium: 9.4 mg/dL (ref 8.4–10.5)
Creatinine, Ser: 0.9 mg/dL (ref 0.4–1.5)
GFR: 93.83 mL/min (ref >60.00)
Glucose, Bld: 89 mg/dL (ref 70–99)

## 2012-05-27 LAB — TSH: TSH: 0.21 u[IU]/mL — ABNORMAL LOW (ref 0.35–5.50)

## 2012-05-27 LAB — LIPID PANEL
Cholesterol: 177 mg/dL (ref 0–200)
LDL Cholesterol: 103 mg/dL — ABNORMAL HIGH (ref 0–99)
Triglycerides: 194 mg/dL — ABNORMAL HIGH (ref 0.0–149.0)
VLDL: 38.8 mg/dL (ref 0.0–40.0)

## 2012-05-27 LAB — HEPATIC FUNCTION PANEL: Total Bilirubin: 0.8 mg/dL (ref 0.3–1.2)

## 2012-06-02 ENCOUNTER — Encounter: Payer: Self-pay | Admitting: *Deleted

## 2012-06-12 ENCOUNTER — Other Ambulatory Visit: Payer: Self-pay | Admitting: *Deleted

## 2012-06-12 MED ORDER — LEVOTHYROXINE SODIUM 125 MCG PO TABS: 125.0000 ug | ORAL_TABLET | Freq: Every day | ORAL | Status: DC

## 2012-07-22 ENCOUNTER — Other Ambulatory Visit: Payer: BC Managed Care – PPO

## 2012-07-29 ENCOUNTER — Other Ambulatory Visit: Payer: Self-pay

## 2012-07-29 ENCOUNTER — Other Ambulatory Visit (INDEPENDENT_AMBULATORY_CARE_PROVIDER_SITE_OTHER): Payer: BC Managed Care – PPO

## 2012-07-29 DIAGNOSIS — Q231 Congenital insufficiency of aortic valve: Secondary | ICD-10-CM

## 2012-07-29 DIAGNOSIS — I359 Nonrheumatic aortic valve disorder, unspecified: Secondary | ICD-10-CM

## 2012-07-29 DIAGNOSIS — I351 Nonrheumatic aortic (valve) insufficiency: Secondary | ICD-10-CM

## 2012-08-19 ENCOUNTER — Encounter: Payer: Self-pay | Admitting: Cardiovascular Disease

## 2012-08-19 ENCOUNTER — Ambulatory Visit (INDEPENDENT_AMBULATORY_CARE_PROVIDER_SITE_OTHER): Payer: BC Managed Care – PPO | Admitting: Cardiovascular Disease

## 2012-08-19 VITALS — BP 120/60 | HR 53 | Ht 66.0 in | Wt 154.2 lb

## 2012-08-19 DIAGNOSIS — I351 Nonrheumatic aortic (valve) insufficiency: Secondary | ICD-10-CM

## 2012-08-19 DIAGNOSIS — Q231 Congenital insufficiency of aortic valve: Secondary | ICD-10-CM

## 2012-08-19 DIAGNOSIS — E785 Hyperlipidemia, unspecified: Secondary | ICD-10-CM

## 2012-08-19 DIAGNOSIS — I359 Nonrheumatic aortic valve disorder, unspecified: Secondary | ICD-10-CM

## 2012-08-19 DIAGNOSIS — Q2381 Bicuspid aortic valve: Secondary | ICD-10-CM

## 2012-08-19 NOTE — Assessment & Plan Note (Signed)
He is watching his diet and exercising religiously.

## 2012-08-19 NOTE — Progress Notes (Signed)
   Patient ID: Jeffrey Tucker, male    DOB: [birth year], 56 y.o.   MRN: [id number]  HPI Comments: Jeffrey Tucker is a very pleasant 56 year old gentleman, patient of Dr. Alphonsus Tucker, with moderate to severe aortic valve regurgitation, mild to moderately dilated aortic root and ascending aorta, possible bicuspid aortic valve.  He reports that he is relatively asymptomatic. He denies any significant shortness of breath, edema, lightheadedness or dizziness. No chest tightness with exertion.  He is active at baseline and would like to be proactive with his medical health. He has been losing weight by watching his diet and exercising. He reports that he is able to jog without any significant symptoms.  Repeat echocardiogram done recently showed no significant change from last year. Aortic valve insufficiency is moderate to severe, no significant dilatation of the left ventricle. Mild LV noted.   EKG shows sinus bradycardia with rate 53 beats per minute, voltage consistent with LVH, no other significant ST or T wave changes   Outpatient Encounter Prescriptions as of 08/19/2012  Medication Sig Dispense Refill  . albuterol (PROAIR HFA) 108 (90 BASE) MCG/ACT inhaler Inhale 2 puffs into the lungs 3 (three) times daily as needed. Asthma flare up       . aspirin 81 MG tablet Take 81 mg by mouth daily.      Marland Kitchen levothyroxine (SYNTHROID, LEVOTHROID) 125 MCG tablet Take 1 tablet (125 mcg total) by mouth daily.  90 tablet  3     Review of Systems  Constitutional: Negative.   HENT: Negative.   Eyes: Negative.   Respiratory: Negative.   Cardiovascular: Negative.   Gastrointestinal: Negative.   Musculoskeletal: Negative.   Skin: Negative.   Neurological: Negative.   Hematological: Negative.   Psychiatric/Behavioral: Negative.   All other systems reviewed and are negative.    BP 120/60  Pulse 53  Ht 5\' 6"  (1.676 m)  Wt 154 lb 4 oz (69.967 kg)  BMI 24.90 kg/m2  Physical Exam  Nursing note and vitals  reviewed. Constitutional: He is oriented to person, place, and time. He appears well-developed and well-nourished.  HENT:  Head: Normocephalic.  Nose: Nose normal.  Mouth/Throat: Oropharynx is clear and moist.  Eyes: Conjunctivae normal are normal. Pupils are equal, round, and reactive to light.  Neck: Normal range of motion. Neck supple. No JVD present.  Cardiovascular: Normal rate, regular rhythm, S1 normal, S2 normal and intact distal pulses.  Exam reveals no gallop and no friction rub.   Murmur heard.  No systolic murmur is present   Decrescendo diastolic murmur is present with a grade of 2/6  Pulmonary/Chest: Effort normal and breath sounds normal. No respiratory distress. He has no wheezes. He has no rales. He exhibits no tenderness.  Abdominal: Soft. Bowel sounds are normal. He exhibits no distension. There is no tenderness.  Musculoskeletal: Normal range of motion. He exhibits no edema and no tenderness.  Lymphadenopathy:    He has no cervical adenopathy.  Neurological: He is alert and oriented to person, place, and time. Coordination normal.  Skin: Skin is warm and dry. No rash noted. No erythema.  Psychiatric: He has a normal mood and affect. His behavior is normal. Judgment and thought content normal.           Assessment and Plan

## 2012-08-19 NOTE — Assessment & Plan Note (Signed)
Regurgitation likely secondary to bicuspid valve. Associated mild to moderate descending aorta dilatation which we will watch closely.

## 2012-08-19 NOTE — Patient Instructions (Addendum)
You are doing well. No medication changes were made.  Please call next year in august for an echocardiogram for aortic valve regurgitation  Please call us if you have new issues that need to be addressed before your next appt.  Your physician wants you to follow-up in: 12 months.  You will receive a reminder letter in the mail two months in advance. If you don't receive a letter, please call our office to schedule the follow-up appointment.

## 2012-08-19 NOTE — Assessment & Plan Note (Signed)
Moderate to severe aortic valve insufficiency. Likely bicuspid aortic valve. We'll continue to monitor her him given lack of symptoms. Repeat echocardiogram next year.

## 2012-09-08 ENCOUNTER — Ambulatory Visit: Payer: Self-pay | Admitting: Gastroenterology

## 2012-09-16 ENCOUNTER — Encounter: Payer: Self-pay | Admitting: Internal Medicine

## 2012-12-26 ENCOUNTER — Other Ambulatory Visit: Payer: Self-pay | Admitting: Family Medicine

## 2012-12-26 MED ORDER — ALBUTEROL SULFATE HFA 108 (90 BASE) MCG/ACT IN AERS: 2.0000 | INHALATION_SPRAY | Freq: Three times a day (TID) | RESPIRATORY_TRACT | Status: DC | PRN

## 2012-12-26 NOTE — Telephone Encounter (Signed)
rx sent to pharmacy by e-script  

## 2012-12-26 NOTE — Telephone Encounter (Signed)
Okay #1 x 1 

## 2012-12-26 NOTE — Telephone Encounter (Signed)
Pt requesting RX refill for albuterol inhaler.  He was last seen 05/2012.  Please advise.

## 2012-12-28 ENCOUNTER — Other Ambulatory Visit: Payer: Self-pay | Admitting: Family Medicine

## 2012-12-28 MED ORDER — ALBUTEROL SULFATE HFA 108 (90 BASE) MCG/ACT IN AERS: 2.0000 | INHALATION_SPRAY | Freq: Three times a day (TID) | RESPIRATORY_TRACT | Status: DC | PRN

## 2013-01-02 ENCOUNTER — Telehealth: Payer: Self-pay

## 2013-01-02 NOTE — Telephone Encounter (Signed)
Jeffrey Tucker with [facility] left v/m that received requested records from our office except echocardiogram 06/2011 and 07/2012. I called and left v/m for Jeffrey Tucker would need to contact Dr Windell Hummingbird office for that information.

## 2013-01-10 ENCOUNTER — Other Ambulatory Visit: Payer: Self-pay

## 2013-06-26 ENCOUNTER — Other Ambulatory Visit: Payer: Self-pay | Admitting: Internal Medicine

## 2013-07-31 ENCOUNTER — Other Ambulatory Visit: Payer: Self-pay

## 2013-07-31 ENCOUNTER — Other Ambulatory Visit (INDEPENDENT_AMBULATORY_CARE_PROVIDER_SITE_OTHER): Payer: BC Managed Care – PPO

## 2013-07-31 DIAGNOSIS — I351 Nonrheumatic aortic (valve) insufficiency: Secondary | ICD-10-CM

## 2013-07-31 DIAGNOSIS — I359 Nonrheumatic aortic valve disorder, unspecified: Secondary | ICD-10-CM

## 2013-07-31 DIAGNOSIS — Q231 Congenital insufficiency of aortic valve: Secondary | ICD-10-CM

## 2013-08-19 ENCOUNTER — Telehealth: Payer: Self-pay

## 2013-08-19 NOTE — Telephone Encounter (Signed)
Spoke w/ pt.  He is aware of results.  

## 2013-08-19 NOTE — Telephone Encounter (Signed)
Message copied by Marilynne Halsted on Wed Aug 19, 2013  3:40 PM ------      Message from: Jeffrey Tucker      Created: Thu Aug 13, 2013 10:10 PM       Moderate aortic valve regurg, on echo      Better than last year      Otherwise a normal study ------

## 2013-08-19 NOTE — Telephone Encounter (Signed)
lmom 

## 2013-08-21 ENCOUNTER — Encounter: Payer: Self-pay | Admitting: Cardiovascular Disease

## 2013-08-21 ENCOUNTER — Ambulatory Visit (INDEPENDENT_AMBULATORY_CARE_PROVIDER_SITE_OTHER): Payer: BC Managed Care – PPO | Admitting: Cardiovascular Disease

## 2013-08-21 VITALS — BP 138/58 | HR 53 | Ht 66.0 in | Wt 159.2 lb

## 2013-08-21 DIAGNOSIS — I77810 Thoracic aortic ectasia: Secondary | ICD-10-CM

## 2013-08-21 DIAGNOSIS — I359 Nonrheumatic aortic valve disorder, unspecified: Secondary | ICD-10-CM

## 2013-08-21 DIAGNOSIS — E785 Hyperlipidemia, unspecified: Secondary | ICD-10-CM

## 2013-08-21 DIAGNOSIS — I351 Nonrheumatic aortic (valve) insufficiency: Secondary | ICD-10-CM

## 2013-08-21 DIAGNOSIS — Q231 Congenital insufficiency of aortic valve: Secondary | ICD-10-CM

## 2013-08-21 MED ORDER — AMLODIPINE BESYLATE 5 MG PO TABS: 5.0000 mg | ORAL_TABLET | Freq: Every day | ORAL | Status: DC

## 2013-08-21 NOTE — Assessment & Plan Note (Signed)
Valve was not well imaged but presumed to be bicuspid aortic valve given his dilated aortic root, regurgitation

## 2013-08-21 NOTE — Progress Notes (Signed)
   Patient ID: Jeffrey Tucker, male    DOB: Jul 28, 1956, 57 y.o.   MRN: [id number]  HPI Comments: [name] is a very pleasant 57 year old gentleman, patient of Dr. Alphonsus Sias, with moderate to severe aortic valve regurgitation by previous echocardiograms, mild to moderately dilated aortic root and ascending aorta, possible bicuspid aortic valve. He presents for routine followup  He reports that he is relatively asymptomatic. He denies any significant shortness of breath, edema, lightheadedness or dizziness. No chest tightness with exertion.  He is active at baseline. He has been monitoring his blood pressure and typically this runs in the 125-140 systolic. His weight has been stable. Overall he feels well.  Recent echocardiogram shows at least moderate aortic valve insufficiency, no dramatic change in his aortic root or ascending aorta. Still mild to moderately dilated, 4.1 cm.(Same as last year)  EKG shows sinus bradycardia with rate 53 beats per minute, voltage consistent with LVH, no other significant ST or T wave changes   Outpatient Encounter Prescriptions as of 08/21/2013  Medication Sig Dispense Refill  . albuterol (PROAIR HFA) 108 (90 BASE) MCG/ACT inhaler Inhale 2 puffs into the lungs 3 (three) times daily as needed. Asthma flare up  1 Inhaler  1  . levothyroxine (SYNTHROID, LEVOTHROID) 125 MCG tablet TAKE ONE (1) TABLET BY MOUTH EVERY DAY  90 tablet  0  . [DISCONTINUED] aspirin 81 MG tablet Take 81 mg by mouth daily.       No facility-administered encounter medications on file as of 08/21/2013.    Review of Systems  Constitutional: Negative.   HENT: Negative.   Eyes: Negative.   Respiratory: Negative.   Cardiovascular: Negative.   Gastrointestinal: Negative.   Endocrine: Negative.   Musculoskeletal: Negative.   Skin: Negative.   Allergic/Immunologic: Negative.   Neurological: Negative.   Hematological: Negative.   Psychiatric/Behavioral: Negative.   All other systems reviewed and are  negative.    BP 138/58  Pulse 53  Ht 5\' 6"  (1.676 m)  Wt 159 lb 4 oz (72.235 kg)  BMI 25.72 kg/m2  Physical Exam  Nursing note and vitals reviewed. Constitutional: He is oriented to person, place, and time. He appears well-developed and well-nourished.  HENT:  Head: Normocephalic.  Nose: Nose normal.  Mouth/Throat: Oropharynx is clear and moist.  Eyes: Conjunctivae are normal. Pupils are equal, round, and reactive to light.  Neck: Normal range of motion. Neck supple. No JVD present.  Cardiovascular: Normal rate, regular rhythm, S1 normal, S2 normal and intact distal pulses.  Exam reveals no gallop and no friction rub.   Murmur heard.  No systolic murmur is present   Decrescendo diastolic murmur is present with a grade of 2/6  Pulmonary/Chest: Effort normal and breath sounds normal. No respiratory distress. He has no wheezes. He has no rales. He exhibits no tenderness.  Abdominal: Soft. Bowel sounds are normal. He exhibits no distension. There is no tenderness.  Musculoskeletal: Normal range of motion. He exhibits no edema and no tenderness.  Lymphadenopathy:    He has no cervical adenopathy.  Neurological: He is alert and oriented to person, place, and time. Coordination normal.  Skin: Skin is warm and dry. No rash noted. No erythema.  Psychiatric: He has a normal mood and affect. His behavior is normal. Judgment and thought content normal.      Assessment and Plan

## 2013-08-21 NOTE — Assessment & Plan Note (Signed)
4.1 cm dilatation, mild to moderate range. 4.0 last year. No significant progression. We'll continue to monitor this periodically with echocardiogram. He is interested in being very aggressive with his blood pressure. We will start him on amlodipine 2.5 mg daily, possible titration up to 5 mg daily. He we'll closely monitor his blood pressure with goal systolic less than 135.

## 2013-08-21 NOTE — Assessment & Plan Note (Signed)
He reports that he will have repeat lab work with Dr. Alphonsus Sias. He has done very well with recent diet changes over the past several years

## 2013-08-21 NOTE — Assessment & Plan Note (Signed)
Stable moderate aortic valve insufficiency. Repeat echocardiogram in one year at his request.

## 2013-08-21 NOTE — Patient Instructions (Addendum)
You are doing well. Echocardiogram looks the same  Read about Red Yeast Rice for cholesterol Consider starting amlodipine 2.5 to 5 mg daily  Please call us if you have new issues that need to be addressed before your next appt.  Your physician wants you to follow-up in: 12 months.  You will receive a reminder letter in the mail two months in advance. If you don't receive a letter, please call our office to schedule the follow-up appointment.

## 2013-09-09 ENCOUNTER — Ambulatory Visit (INDEPENDENT_AMBULATORY_CARE_PROVIDER_SITE_OTHER): Payer: BC Managed Care – PPO | Admitting: Internal Medicine

## 2013-09-09 ENCOUNTER — Encounter: Payer: Self-pay | Admitting: Internal Medicine

## 2013-09-09 VITALS — BP 128/68 | HR 50 | Temp 97.5°F | Ht 66.0 in | Wt 159.0 lb

## 2013-09-09 DIAGNOSIS — Z23 Encounter for immunization: Secondary | ICD-10-CM

## 2013-09-09 DIAGNOSIS — E039 Hypothyroidism, unspecified: Secondary | ICD-10-CM

## 2013-09-09 DIAGNOSIS — E785 Hyperlipidemia, unspecified: Secondary | ICD-10-CM

## 2013-09-09 DIAGNOSIS — J4520 Mild intermittent asthma, uncomplicated: Secondary | ICD-10-CM

## 2013-09-09 DIAGNOSIS — J45909 Unspecified asthma, uncomplicated: Secondary | ICD-10-CM

## 2013-09-09 DIAGNOSIS — Z0000 Encounter for general adult medical examination without abnormal findings: Secondary | ICD-10-CM

## 2013-09-09 DIAGNOSIS — I77810 Thoracic aortic ectasia: Secondary | ICD-10-CM

## 2013-09-09 DIAGNOSIS — Z125 Encounter for screening for malignant neoplasm of prostate: Secondary | ICD-10-CM

## 2013-09-09 LAB — CBC WITH DIFFERENTIAL/PLATELET
Basophils Relative: 0.8 % (ref 0.0–3.0)
Eosinophils Relative: 7.4 % — ABNORMAL HIGH (ref 0.0–5.0)
Hemoglobin: 13.7 g/dL (ref 13.0–17.0)
Lymphocytes Relative: 32.2 % (ref 12.0–46.0)
MCHC: 33.9 g/dL (ref 30.0–36.0)
MCV: 91.8 fl (ref 78.0–100.0)
Monocytes Absolute: 0.6 10*3/uL (ref 0.1–1.0)
Neutro Abs: 3.2 10*3/uL (ref 1.4–7.7)
Neutrophils Relative %: 49.7 % (ref 43.0–77.0)
RBC: 4.41 Mil/uL (ref 4.22–5.81)
WBC: 6.5 10*3/uL (ref 4.5–10.5)

## 2013-09-09 LAB — HEPATIC FUNCTION PANEL
ALT: 26 U/L (ref 0–53)
AST: 25 U/L (ref 0–37)
Bilirubin, Direct: 0.1 mg/dL (ref 0.0–0.3)
Total Bilirubin: 0.6 mg/dL (ref 0.3–1.2)
Total Protein: 7 g/dL (ref 6.0–8.3)

## 2013-09-09 LAB — BASIC METABOLIC PANEL
CO2: 29 mEq/L (ref 19–32)
Chloride: 103 mEq/L (ref 96–112)
Creatinine, Ser: 0.8 mg/dL (ref 0.4–1.5)
Potassium: 4.1 mEq/L (ref 3.5–5.1)
Sodium: 140 mEq/L (ref 135–145)

## 2013-09-09 LAB — LIPID PANEL
Total CHOL/HDL Ratio: 6
Triglycerides: 346 mg/dL — ABNORMAL HIGH (ref 0.0–149.0)

## 2013-09-09 LAB — PSA: PSA: 0.33 ng/mL (ref 0.10–4.00)

## 2013-09-09 NOTE — Assessment & Plan Note (Signed)
Well controlled with diet and exercise Will recheck

## 2013-09-09 NOTE — Assessment & Plan Note (Signed)
Now on amlodipine to keep BP low

## 2013-09-09 NOTE — Progress Notes (Signed)
Subjective:    Patient ID: Jeffrey Tucker, male    DOB: [birth year], 57 y.o.   MRN: [id number]  HPI Here for physical Recently saw Dr Mariah Milling Started the amlodipine to keep BP down preemptively (for aortic dilation) No problems on this--no dizziness or leg swelling Restarted ASA as well  No new concerns No changes in family history  Current Outpatient Prescriptions on File Prior to Visit  Medication Sig Dispense Refill  . albuterol (PROAIR HFA) 108 (90 BASE) MCG/ACT inhaler Inhale 2 puffs into the lungs 3 (three) times daily as needed. Asthma flare up  1 Inhaler  1  . amLODipine (NORVASC) 5 MG tablet Take 1 tablet (5 mg total) by mouth daily.  90 tablet  3  . levothyroxine (SYNTHROID, LEVOTHROID) 125 MCG tablet TAKE ONE (1) TABLET BY MOUTH EVERY DAY  90 tablet  0   No current facility-administered medications on file prior to visit.    Allergies  Allergen Reactions  . Tetracycline Hcl     REACTION: joint pain    Past Medical History  Diagnosis Date  . Hyperlipidemia   . Thyroid disease     hypothyroidism  . Allergy     allergic rhinitis  . Asthma     mostly in childhood    Past Surgical History  Procedure Laterality Date  . Vein ligation and stripping  1997  . Cholecystectomy  2006  . Cardiac catheterization  2006    [place]  . Cardiovascular stress test  2006    [place]    Family History  Problem Relation Age of Onset  . Alcohol abuse Father   . Prostate cancer Father   . Alcohol abuse Mother   . Liver cancer Paternal Grandmother   . Colon cancer Neg Hx   . Coronary artery disease Neg Hx   . Hypertension Neg Hx   . Diabetes Neg Hx     History   Social History  . Marital Status: Married    Spouse Name: Copelan Maultsby    Number of Children: 2  . 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 Smoking status: Former Smoker    Quit date: 11/26/1978  .  Smokeless tobacco: Never Used     Comment: social smoked till 1980's  . Alcohol Use: No     Comment: none since 1984  . Drug Use: No  . Sexual Activity: Not on file   Other Topics Concern  . Not on file   Social History Narrative  . No narrative on file   Review of Systems  Constitutional: Negative for fatigue and unexpected weight change.       Wears seat belt  HENT: Positive for congestion, hearing loss and rhinorrhea. Negative for dental problem and tinnitus.        Mild hearing loss Regular with dentist  Eyes: Negative for visual disturbance.       No diplopia or unilateral vision loss  Respiratory: Positive for wheezing. Negative for cough, chest tightness and shortness of breath.        Rarely uses albuterol (once every few months)  Cardiovascular: Negative for chest pain, palpitations and leg swelling.  Gastrointestinal: Negative for nausea, vomiting, abdominal pain, constipation and blood in stool.       No heartburn  Endocrine: Negative for cold intolerance and heat intolerance.  Genitourinary: Positive for difficulty urinating. Negative for urgency  and frequency.       Slow flow-- not much dribbling Nocturia x 2 at most No sexual problems  Musculoskeletal: Negative for arthralgias, back pain and joint swelling.  Skin: Negative for rash.       No suspicious lesions  Allergic/Immunologic: Positive for environmental allergies. Negative for immunocompromised state.       Mild symptoms Rarely uses OTC meds  Neurological: Negative for dizziness, syncope, weakness, light-headedness and numbness.  Hematological: Negative for adenopathy. Does not bruise/bleed easily.  Psychiatric/Behavioral: Negative for sleep disturbance and dysphoric mood. The patient is not nervous/anxious.        Objective:   Physical Exam  Constitutional: He is oriented to person, place, and time. He appears well-developed and well-nourished. No distress.  HENT:  Head: Normocephalic and  atraumatic.  Right Ear: External ear normal.  Left Ear: External ear normal.  Mouth/Throat: Oropharynx is clear and moist. No oropharyngeal exudate.  Eyes: Conjunctivae and EOM are normal. Pupils are equal, round, and reactive to light.  Neck: Normal range of motion. Neck supple. No thyromegaly present.  Cardiovascular: Normal rate, regular rhythm and intact distal pulses.  Exam reveals no gallop.   Murmur heard. Soft aortic systolic murmur and early diastolic murmur  Pulmonary/Chest: Effort normal and breath sounds normal. No respiratory distress. He has no wheezes. He has no rales.  Abdominal: Soft. There is no tenderness.  Musculoskeletal: He exhibits no edema and no tenderness.  Lymphadenopathy:    He has no cervical adenopathy.  Neurological: He is alert and oriented to person, place, and time.  Skin: No rash noted. No erythema.  Psychiatric: He has a normal mood and affect. His behavior is normal.          Assessment & Plan:

## 2013-09-09 NOTE — Assessment & Plan Note (Signed)
Will check labs

## 2013-09-09 NOTE — Assessment & Plan Note (Signed)
Generally healthy Will check PSA Flu vaccine

## 2013-09-09 NOTE — Assessment & Plan Note (Signed)
Rarely needs the inhaler °

## 2013-09-09 NOTE — Addendum Note (Signed)
Addended by: Sueanne Margarita on: 09/09/2013 08:51 AM   Modules accepted: Orders

## 2013-10-05 ENCOUNTER — Other Ambulatory Visit: Payer: Self-pay | Admitting: Internal Medicine

## 2014-08-19 ENCOUNTER — Other Ambulatory Visit (INDEPENDENT_AMBULATORY_CARE_PROVIDER_SITE_OTHER): Payer: BC Managed Care – PPO

## 2014-08-19 ENCOUNTER — Encounter: Payer: Self-pay | Admitting: Cardiovascular Disease

## 2014-08-19 ENCOUNTER — Other Ambulatory Visit: Payer: Self-pay

## 2014-08-19 ENCOUNTER — Ambulatory Visit (INDEPENDENT_AMBULATORY_CARE_PROVIDER_SITE_OTHER): Payer: BC Managed Care – PPO | Admitting: Cardiovascular Disease

## 2014-08-19 VITALS — BP 119/73 | HR 49 | Ht 66.0 in | Wt 157.0 lb

## 2014-08-19 DIAGNOSIS — I351 Nonrheumatic aortic (valve) insufficiency: Secondary | ICD-10-CM

## 2014-08-19 DIAGNOSIS — Q231 Congenital insufficiency of aortic valve: Secondary | ICD-10-CM

## 2014-08-19 DIAGNOSIS — R011 Cardiac murmur, unspecified: Secondary | ICD-10-CM

## 2014-08-19 DIAGNOSIS — I359 Nonrheumatic aortic valve disorder, unspecified: Secondary | ICD-10-CM

## 2014-08-19 DIAGNOSIS — E785 Hyperlipidemia, unspecified: Secondary | ICD-10-CM

## 2014-08-19 DIAGNOSIS — Z0000 Encounter for general adult medical examination without abnormal findings: Secondary | ICD-10-CM

## 2014-08-19 DIAGNOSIS — I77810 Thoracic aortic ectasia: Secondary | ICD-10-CM

## 2014-08-19 NOTE — Assessment & Plan Note (Addendum)
Repeat echocardiogram scheduled for today. No recent dramatic worsening in his condition clinically or by echocardiogram in the past several years. Long discussion about bicuspid aortic valve, associated pathology, possible progression of his disease over the next several decades

## 2014-08-19 NOTE — Patient Instructions (Signed)
You are doing well. No medication changes were made.  We will order an echocardiogram for suspected bicuspid aortic valve, aortic valve regurgitation  Please call us if you have new issues that need to be addressed before your next appt.  Your physician wants you to follow-up in: 12 months.  You will receive a reminder letter in the mail two months in advance. If you don't receive a letter, please call our office to schedule the follow-up appointment.

## 2014-08-19 NOTE — Progress Notes (Signed)
   Patient ID: Jeffrey Tucker, male    DOB: [birth year], 58 y.o.   MRN: [id number]  HPI Comments: [name] is a very pleasant 58 year old gentleman, patient of Dr. Silvio Pate, with moderate to severe aortic valve regurgitation by previous echocardiograms, mild to moderately dilated aortic root and ascending aorta, possible bicuspid aortic valve. He presents for routine followup  Over the past several years, there has been no dramatic change in his echocardiogram findings.   echocardiogram in 2014 showed at least moderate aortic valve insufficiency, no dramatic change in his aortic root or ascending aorta. Still mild to moderately dilated, 4.1 cm.(Same as 2013) He has not had an echocardiogram this year but this will be scheduled for today at his request.  He reports that he is relatively asymptomatic. He denies any significant shortness of breath, edema, lightheadedness or dizziness. No chest tightness with exertion.  He is active at baseline.   EKG shows sinus bradycardia with rate 49 beats per minute, voltage consistent with LVH, no other significant ST or T wave changes   Outpatient Encounter Prescriptions as of 08/19/2014  Medication Sig  . albuterol (PROAIR HFA) 108 (90 BASE) MCG/ACT inhaler Inhale 2 puffs into the lungs 3 (three) times daily as needed. Asthma flare up  . amLODipine (NORVASC) 5 MG tablet Take 1 tablet (5 mg total) by mouth daily.  Marland Kitchen aspirin 81 MG tablet Take 81 mg by mouth daily.  Marland Kitchen levothyroxine (SYNTHROID, LEVOTHROID) 125 MCG tablet TAKE 1 TABLET BY MOUTH DAILY    Review of Systems  Constitutional: Negative.   HENT: Negative.   Eyes: Negative.   Respiratory: Negative.   Cardiovascular: Negative.   Gastrointestinal: Negative.   Endocrine: Negative.   Musculoskeletal: Negative.   Skin: Negative.   Allergic/Immunologic: Negative.   Neurological: Negative.   Hematological: Negative.   Psychiatric/Behavioral: Negative.   All other systems reviewed and are  negative.   BP 119/73  Pulse 49  Ht 5\' 6"  (1.676 m)  Wt 157 lb (71.215 kg)  BMI 25.35 kg/m2  Physical Exam  Nursing note and vitals reviewed. Constitutional: He is oriented to person, place, and time. He appears well-developed and well-nourished.  HENT:  Head: Normocephalic.  Nose: Nose normal.  Mouth/Throat: Oropharynx is clear and moist.  Eyes: Conjunctivae are normal. Pupils are equal, round, and reactive to light.  Neck: Normal range of motion. Neck supple. No JVD present.  Cardiovascular: Normal rate, regular rhythm, S1 normal, S2 normal and intact distal pulses.  Exam reveals no gallop and no friction rub.   Murmur heard.  No systolic murmur is present   Decrescendo diastolic murmur is present with a grade of 2/6  Pulmonary/Chest: Effort normal and breath sounds normal. No respiratory distress. He has no wheezes. He has no rales. He exhibits no tenderness.  Abdominal: Soft. Bowel sounds are normal. He exhibits no distension. There is no tenderness.  Musculoskeletal: Normal range of motion. He exhibits no edema and no tenderness.  Lymphadenopathy:    He has no cervical adenopathy.  Neurological: He is alert and oriented to person, place, and time. Coordination normal.  Skin: Skin is warm and dry. No rash noted. No erythema.  Psychiatric: He has a normal mood and affect. His behavior is normal. Judgment and thought content normal.      Assessment and Plan

## 2014-08-19 NOTE — Assessment & Plan Note (Signed)
Echocardiogram ordered for mild to moderately dilated aortic root in 2014

## 2014-08-19 NOTE — Assessment & Plan Note (Signed)
Asymptomatic from his mitral valve regurg. Moderate on prior echocardiogram

## 2014-08-19 NOTE — Assessment & Plan Note (Signed)
Encouraged continued exercise, watching his diet. Currently not on a statin

## 2014-09-10 ENCOUNTER — Ambulatory Visit (INDEPENDENT_AMBULATORY_CARE_PROVIDER_SITE_OTHER): Payer: BC Managed Care – PPO | Admitting: Internal Medicine

## 2014-09-10 ENCOUNTER — Other Ambulatory Visit: Payer: Self-pay

## 2014-09-10 ENCOUNTER — Encounter: Payer: Self-pay | Admitting: Internal Medicine

## 2014-09-10 VITALS — BP 128/58 | HR 49 | Temp 98.3°F | Resp 14 | Ht 66.5 in | Wt 157.2 lb

## 2014-09-10 DIAGNOSIS — N400 Enlarged prostate without lower urinary tract symptoms: Secondary | ICD-10-CM

## 2014-09-10 DIAGNOSIS — E781 Pure hyperglyceridemia: Secondary | ICD-10-CM

## 2014-09-10 DIAGNOSIS — J4520 Mild intermittent asthma, uncomplicated: Secondary | ICD-10-CM

## 2014-09-10 DIAGNOSIS — E038 Other specified hypothyroidism: Secondary | ICD-10-CM

## 2014-09-10 DIAGNOSIS — I77810 Thoracic aortic ectasia: Secondary | ICD-10-CM

## 2014-09-10 DIAGNOSIS — I712 Thoracic aortic aneurysm, without rupture: Secondary | ICD-10-CM

## 2014-09-10 DIAGNOSIS — Z0000 Encounter for general adult medical examination without abnormal findings: Secondary | ICD-10-CM

## 2014-09-10 DIAGNOSIS — Z23 Encounter for immunization: Secondary | ICD-10-CM

## 2014-09-10 LAB — COMPREHENSIVE METABOLIC PANEL
ALBUMIN: 3.9 g/dL (ref 3.5–5.2)
ALT: 15 U/L (ref 0–53)
AST: 19 U/L (ref 0–37)
Alkaline Phosphatase: 69 U/L (ref 39–117)
BUN: 15 mg/dL (ref 6–23)
CALCIUM: 9.1 mg/dL (ref 8.4–10.5)
CO2: 28 meq/L (ref 19–32)
CREATININE: 0.9 mg/dL (ref 0.4–1.5)
Chloride: 104 mEq/L (ref 96–112)
GFR: 93.08 mL/min (ref >60.00)
Glucose, Bld: 89 mg/dL (ref 70–99)
POTASSIUM: 4 meq/L (ref 3.5–5.1)
SODIUM: 138 meq/L (ref 135–145)
TOTAL PROTEIN: 7.6 g/dL (ref 6.0–8.3)
Total Bilirubin: 0.8 mg/dL (ref 0.2–1.2)

## 2014-09-10 LAB — CBC WITH DIFFERENTIAL/PLATELET
BASOS ABS: 0.1 10*3/uL (ref 0.0–0.1)
Basophils Relative: 0.8 % (ref 0.0–3.0)
Eosinophils Absolute: 0.4 10*3/uL (ref 0.0–0.7)
Eosinophils Relative: 5.7 % — ABNORMAL HIGH (ref 0.0–5.0)
HCT: 42.6 % (ref 39.0–52.0)
HEMOGLOBIN: 14.2 g/dL (ref 13.0–17.0)
LYMPHS PCT: 30.6 % (ref 12.0–46.0)
Lymphs Abs: 2.1 10*3/uL (ref 0.7–4.0)
MCHC: 33.2 g/dL (ref 30.0–36.0)
MCV: 93.3 fl (ref 78.0–100.0)
MONOS PCT: 10.1 % (ref 3.0–12.0)
Monocytes Absolute: 0.7 10*3/uL (ref 0.1–1.0)
Neutro Abs: 3.7 10*3/uL (ref 1.4–7.7)
Neutrophils Relative %: 52.8 % (ref 43.0–77.0)
PLATELETS: 229 10*3/uL (ref 150.0–400.0)
RBC: 4.57 Mil/uL (ref 4.22–5.81)
RDW: 13.4 % (ref 11.5–15.5)
WBC: 6.9 10*3/uL (ref 4.0–10.5)

## 2014-09-10 LAB — T4, FREE: Free T4: 1.07 ng/dL (ref 0.60–1.60)

## 2014-09-10 LAB — TSH: TSH: 2.64 u[IU]/mL (ref 0.35–4.50)

## 2014-09-10 MED ORDER — AMLODIPINE BESYLATE 5 MG PO TABS: 5.0000 mg | ORAL_TABLET | Freq: Every day | ORAL | Status: DC

## 2014-09-10 NOTE — Assessment & Plan Note (Signed)
Seems euthyroid Due for labs 

## 2014-09-10 NOTE — Assessment & Plan Note (Signed)
Mild early symptoms No Rx needed

## 2014-09-10 NOTE — Assessment & Plan Note (Signed)
No meds appropriate

## 2014-09-10 NOTE — Patient Instructions (Signed)
If your left ankle doesn't improve, call for an appointment with Dr Lytle [place] sports medicine specialist.

## 2014-09-10 NOTE — Assessment & Plan Note (Signed)
Stable on recent echo No action needed

## 2014-09-10 NOTE — Progress Notes (Signed)
Subjective:    Patient ID: Jeffrey Tucker, male    DOB: [birth year], 58 y.o.   MRN: [id number]  HPI Here for physical Recent cardiology evaluation--echo stable  Wife has noted that his breath has changed Feels it is from "the inside" Dental hygiene is fine as far as he can tell--brushes bid  No heartburn No water brash  In spring, after a run, he got sore spot over left Achilles Still sore now after 4-5 months He stopped running--now just walks (5-8 miles per week) Does have a limp from stiffness in AM--loosens up over the time  Notices slower stream and has to push to get urine going Only occasional dribbling Nocturia x 0-2 No major symptoms  Current Outpatient Prescriptions on File Prior to Visit  Medication Sig Dispense Refill  . albuterol (PROAIR HFA) 108 (90 BASE) MCG/ACT inhaler Inhale 2 puffs into the lungs 3 (three) times daily as needed. Asthma flare up  1 Inhaler  1  . amLODipine (NORVASC) 5 MG tablet Take 1 tablet (5 mg total) by mouth daily.  90 tablet  3  . aspirin 81 MG tablet Take 81 mg by mouth daily.      Marland Kitchen levothyroxine (SYNTHROID, LEVOTHROID) 125 MCG tablet TAKE 1 TABLET BY MOUTH DAILY  90 tablet  3   No current facility-administered medications on file prior to visit.    Allergies  Allergen Reactions  . Tetracycline Hcl     REACTION: joint pain    Past Medical History  Diagnosis Date  . Hypertriglyceridemia   . Thyroid disease     hypothyroidism  . Allergy     allergic rhinitis  . Asthma     mostly in childhood    Past Surgical History  Procedure Laterality Date  . Vein ligation and stripping  1997  . Cholecystectomy  2006  . Cardiac catheterization  2006    [place]  . Cardiovascular stress test  2006    [place]    Family History  Problem Relation Age of Onset  . Alcohol abuse Father   . Prostate cancer Father   . Alcohol abuse Mother   . Liver cancer Paternal Grandmother   . Colon cancer Neg Hx   . Coronary artery disease Neg  Hx   . Hypertension Neg Hx   . Diabetes Neg Hx     History   Social History  . Marital Status: Married    Spouse Name: Zackory Pudlo    Number of Children: 2  . Years of Education: N/A   Occupational History  . 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 Smoking status: Former Smoker    Quit date: 11/26/1978  . Smokeless tobacco: Never Used     Comment: social smoked till 1980's  . Alcohol Use: No     Comment: none since 1984  . Drug Use: No  . Sexual Activity: Not on file   Other Topics Concern  . Not on file   Social History Narrative  . No narrative on file   Review of Systems  Constitutional: Negative for fatigue and unexpected weight change.       Wears seat belt  HENT: Positive for hearing loss. Negative for dental problem and tinnitus.        Regular with dentist Mild hearing problems  Eyes: Negative for visual disturbance.       No diplopia or unilateral vision loss May need cataract  surgery soon  Respiratory: Negative for cough, chest tightness and shortness of breath.   Cardiovascular: Negative for chest pain, palpitations and leg swelling.  Gastrointestinal: Negative for nausea, vomiting, abdominal pain, constipation and blood in stool.       No heartburn  Endocrine: Negative for polydipsia and polyuria.  Genitourinary: Positive for difficulty urinating. Negative for urgency and frequency.       No sexual problems  Musculoskeletal: Negative for arthralgias, back pain and joint swelling.  Skin: Negative for rash.       Has one spot on upper back--itchy. stable  Allergic/Immunologic: Positive for environmental allergies. Negative for immunocompromised state.       Generally doesn't need meds  Neurological: Negative for dizziness, syncope, weakness, light-headedness, numbness and headaches.  Hematological: Negative for adenopathy. Does not bruise/bleed easily.  Psychiatric/Behavioral: Negative for sleep  disturbance and dysphoric mood. The patient is not nervous/anxious.        Objective:   Physical Exam  Constitutional: He is oriented to person, place, and time. He appears well-developed and well-nourished. No distress.  HENT:  Head: Normocephalic and atraumatic.  Right Ear: External ear normal.  Left Ear: External ear normal.  Mouth/Throat: Oropharynx is clear and moist. No oropharyngeal exudate.  Eyes: Conjunctivae and EOM are normal. Pupils are equal, round, and reactive to light.  Neck: Normal range of motion. Neck supple. No thyromegaly present.  Cardiovascular: Normal rate, regular rhythm and intact distal pulses.  Exam reveals no gallop.   Soft systolic and early diastolic aortic murmurs  Pulmonary/Chest: Effort normal and breath sounds normal. No respiratory distress. He has no wheezes. He has no rales.  Abdominal: Soft. There is no tenderness.  Musculoskeletal: He exhibits no edema and no tenderness.  Lymphadenopathy:    He has no cervical adenopathy.  Neurological: He is alert and oriented to person, place, and time.  Skin: No rash noted. No erythema.  seb keratosis on back  Psychiatric: He has a normal mood and affect. His behavior is normal.          Assessment & Plan:

## 2014-09-10 NOTE — Assessment & Plan Note (Signed)
Fairly healthy Will update flu shot Defer PSA till at least next year Colonoscopy UTD

## 2014-09-10 NOTE — Assessment & Plan Note (Signed)
Generally quiet Occasionally uses inhaler in the fall

## 2014-09-10 NOTE — Progress Notes (Signed)
Pre visit review using our clinic review tool, if applicable. No additional management support is needed unless otherwise documented below in the visit note. 

## 2014-10-11 ENCOUNTER — Other Ambulatory Visit: Payer: Self-pay | Admitting: Internal Medicine

## 2014-10-18 ENCOUNTER — Telehealth: Payer: Self-pay

## 2014-10-18 NOTE — Telephone Encounter (Signed)
Will check him then

## 2014-10-18 NOTE — Telephone Encounter (Signed)
Pt walked in; for 10 days pt felt like pulled muscle in his back; no known injury. 3-4 days ago pt noticed back on rt side hurting near base of ribs and fatigue;Pt had pneumonia 25 yrs ago and pt feels similar; Pt does not have SOB, difficulty breathing, no cough or fever. Pt does not appear in distress.Spoke with Dr Silvio Pate and can open 11:15 same day appt on 10/19/14. Advised pt of appt on 10/19/14 and that was OK; if pt condition changes or worsens tonight pt will go to UC if needed otherwise pt will see Dr Silvio Pate on 10/19/14. Pt voiced understanding.

## 2014-10-19 ENCOUNTER — Ambulatory Visit (INDEPENDENT_AMBULATORY_CARE_PROVIDER_SITE_OTHER): Payer: BC Managed Care – PPO | Admitting: Internal Medicine

## 2014-10-19 ENCOUNTER — Ambulatory Visit (INDEPENDENT_AMBULATORY_CARE_PROVIDER_SITE_OTHER)
Admission: RE | Admit: 2014-10-19 | Discharge: 2014-10-19 | Disposition: A | Discharge status: Home / Self Care | Payer: BC Managed Care – PPO | Source: Ambulatory Visit | Attending: Internal Medicine | Admitting: Internal Medicine

## 2014-10-19 ENCOUNTER — Encounter: Payer: Self-pay | Admitting: Internal Medicine

## 2014-10-19 VITALS — BP 140/60 | HR 56 | Temp 97.9°F | Wt 160.5 lb

## 2014-10-19 DIAGNOSIS — R0781 Pleurodynia: Secondary | ICD-10-CM

## 2014-10-19 NOTE — Assessment & Plan Note (Addendum)
Right posterior No signs of infection No chest wall tenderness CXR looks okay Reassured him--- discussed symptom relief with heat, NSAIDs

## 2014-10-19 NOTE — Progress Notes (Signed)
Pre visit review using our clinic review tool, if applicable. No additional management support is needed unless otherwise documented below in the visit note. 

## 2014-10-19 NOTE — Progress Notes (Signed)
Subjective:    Patient ID: Jeffrey Tucker, male    DOB: 10/17/1956, 58 y.o.   MRN: [id number]  HPI About 10 days ago--thought he pulled muscle in right mid back Has persisted More noticeable with deep breath or other chest movement (like snorting in) Worried about pleurisy Seems to have worsened in the past few days Reminds him of when he had pneumonia years ago Hasn't done anything for it --no meds or heat, etc  No fever No night sweats or chills No cough May be slightly more tired  Current Outpatient Prescriptions on File Prior to Visit  Medication Sig Dispense Refill  . albuterol (PROAIR HFA) 108 (90 BASE) MCG/ACT inhaler Inhale 2 puffs into the lungs 3 (three) times daily as needed. Asthma flare up 1 Inhaler 1  . amLODipine (NORVASC) 5 MG tablet Take 1 tablet (5 mg total) by mouth daily. 90 tablet 3  . aspirin 81 MG tablet Take 81 mg by mouth daily.    Marland Kitchen levothyroxine (SYNTHROID, LEVOTHROID) 125 MCG tablet TAKE 1 TABLET BY MOUTH DAILY 90 tablet 3   No current facility-administered medications on file prior to visit.    Allergies  Allergen Reactions  . Tetracycline Hcl     REACTION: joint pain    Past Medical History  Diagnosis Date  . Hypertriglyceridemia   . Thyroid disease     hypothyroidism  . Allergy     allergic rhinitis  . Asthma     mostly in childhood  . BPH (benign prostatic hypertrophy)     Past Surgical History  Procedure Laterality Date  . Vein ligation and stripping  1997  . Cholecystectomy  2006  . Cardiac catheterization  2006    [place]  . Cardiovascular stress test  2006    [place]    Family History  Problem Relation Age of Onset  . Alcohol abuse Father   . Prostate cancer Father   . Alcohol abuse Mother   . Asthma Mother   . Liver cancer Paternal Grandmother   . Colon cancer Neg Hx   . Coronary artery disease Neg Hx   . Hypertension Neg Hx   . Diabetes Neg Hx     History   Social History  . Marital Status: Married   Spouse Name: Leiland Mihelich    Number of Children: 2  . Years of Education: N/A   Occupational History  . 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 Smoking status: Former Smoker    Quit date: 11/26/1978  . Smokeless tobacco: Never Used     Comment: social smoked till 1980's  . Alcohol Use: No     Comment: none since 1984  . Drug Use: No  . Sexual Activity: Not on file   Other Topics Concern  . Not on file   Social History Narrative   Review of Systems  No vomiting or diarrhea No rash Appetite is fine No past kidney stone--no dysuria or hematuria     Objective:   Physical Exam  Constitutional: He appears well-developed and well-nourished. No distress.  Neck: Normal range of motion. Neck supple. No thyromegaly present.  Cardiovascular: Normal rate and regular rhythm.  Exam reveals no gallop.   Systolic and diastolic murmurs--not new  Pulmonary/Chest: Effort normal and breath sounds normal. No respiratory distress. He has no wheezes. He has no rales. He exhibits no tenderness.  Musculoskeletal: He exhibits no edema.  Lymphadenopathy:  He has no cervical adenopathy.          Assessment & Plan:

## 2014-12-07 ENCOUNTER — Ambulatory Visit (INDEPENDENT_AMBULATORY_CARE_PROVIDER_SITE_OTHER): Payer: BLUE CROSS/BLUE SHIELD | Admitting: Internal Medicine

## 2014-12-07 ENCOUNTER — Encounter: Payer: Self-pay | Admitting: Internal Medicine

## 2014-12-07 VITALS — BP 138/60 | HR 64 | Temp 98.3°F | Wt 163.0 lb

## 2014-12-07 DIAGNOSIS — N6310 Unspecified lump in the right breast, unspecified quadrant: Secondary | ICD-10-CM | POA: Insufficient documentation

## 2014-12-07 DIAGNOSIS — N63 Unspecified lump in breast: Secondary | ICD-10-CM

## 2014-12-07 NOTE — Patient Instructions (Signed)
If the lump gets bigger, I will set you up to see a surgeon (the chances of this being something serious though are extremely small).

## 2014-12-07 NOTE — Progress Notes (Signed)
Pre visit review using our clinic review tool, if applicable. No additional management support is needed unless otherwise documented below in the visit note. 

## 2014-12-07 NOTE — Assessment & Plan Note (Signed)
Feels benign like simple cyst He only noted it 2 days ago Likely benign---but if grows would send to surgeon for evaluation to be sure

## 2014-12-07 NOTE — Progress Notes (Signed)
   Subjective:    Patient ID: Jeffrey Tucker, male    DOB: 01-02-1956, 59 y.o.   MRN: [id number]  HPI Here due to growth on his right chest Notices a lump next to the nipple He noticed it 2 days ago No pain No nipple discharge  No known trauma to area  Current Outpatient Prescriptions on File Prior to Visit  Medication Sig Dispense Refill  . albuterol (PROAIR HFA) 108 (90 BASE) MCG/ACT inhaler Inhale 2 puffs into the lungs 3 (three) times daily as needed. Asthma flare up 1 Inhaler 1  . amLODipine (NORVASC) 5 MG tablet Take 1 tablet (5 mg total) by mouth daily. 90 tablet 3  . aspirin 81 MG tablet Take 81 mg by mouth daily.    Marland Kitchen levothyroxine (SYNTHROID, LEVOTHROID) 125 MCG tablet TAKE 1 TABLET BY MOUTH DAILY 90 tablet 3   No current facility-administered medications on file prior to visit.    Allergies  Allergen Reactions  . Tetracycline Hcl     REACTION: joint pain    Past Medical History  Diagnosis Date  . Hypertriglyceridemia   . Thyroid disease     hypothyroidism  . Allergy     allergic rhinitis  . Asthma     mostly in childhood  . BPH (benign prostatic hypertrophy)     Past Surgical History  Procedure Laterality Date  . Vein ligation and stripping  1997  . Cholecystectomy  2006  . Cardiac catheterization  2006    [place]  . Cardiovascular stress test  2006    [place]    Family History  Problem Relation Age of Onset  . Alcohol abuse Father   . Prostate cancer Father   . Alcohol abuse Mother   . Asthma Mother   . Liver cancer Paternal Grandmother   . Colon cancer Neg Hx   . Coronary artery disease Neg Hx   . Hypertension Neg Hx   . Diabetes Neg Hx     History   Social History  . Marital Status: Married    Spouse Name: Tori Cupps    Number of Children: 2  . Years of Education: N/A   Occupational History  . 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 Smoking status: Former Smoker    Quit  date: 11/26/1978  . Smokeless tobacco: Never Used     Comment: social smoked till 1980's  . Alcohol Use: No     Comment: none since 1984  . Drug Use: No  . Sexual Activity: Not on file   Other Topics Concern  . Not on file   Social History Narrative   Review of Systems  Not sick No fever     Objective:   Physical Exam  Constitutional: He appears well-developed and well-nourished. No distress.  Pulmonary/Chest:  Small (~1cm) moveable mass just above right areola No tenderness or inflammation No nipple discharge          Assessment & Plan:

## 2015-01-17 ENCOUNTER — Encounter: Payer: Self-pay | Admitting: Internal Medicine

## 2015-01-17 ENCOUNTER — Ambulatory Visit (INDEPENDENT_AMBULATORY_CARE_PROVIDER_SITE_OTHER): Payer: BLUE CROSS/BLUE SHIELD | Admitting: Internal Medicine

## 2015-01-17 VITALS — BP 128/68 | HR 67 | Temp 98.2°F | Wt 162.8 lb

## 2015-01-17 DIAGNOSIS — L723 Sebaceous cyst: Secondary | ICD-10-CM | POA: Insufficient documentation

## 2015-01-17 NOTE — Progress Notes (Signed)
   Subjective:    Patient ID: Jeffrey Tucker, male    DOB: 1956-05-16, 59 y.o.   MRN: [id number]  HPI Here for evaluation of lump on right back Just noticed it last week No pain Hasn't drained  Hasn't tried anything for it Doesn't remember any trauma there  Current Outpatient Prescriptions on File Prior to Visit  Medication Sig Dispense Refill  . albuterol (PROAIR HFA) 108 (90 BASE) MCG/ACT inhaler Inhale 2 puffs into the lungs 3 (three) times daily as needed. Asthma flare up 1 Inhaler 1  . amLODipine (NORVASC) 5 MG tablet Take 1 tablet (5 mg total) by mouth daily. 90 tablet 3  . aspirin 81 MG tablet Take 81 mg by mouth daily.    Marland Kitchen levothyroxine (SYNTHROID, LEVOTHROID) 125 MCG tablet TAKE 1 TABLET BY MOUTH DAILY 90 tablet 3   No current facility-administered medications on file prior to visit.    Allergies  Allergen Reactions  . Tetracycline Hcl     REACTION: joint pain    Past Medical History  Diagnosis Date  . Hypertriglyceridemia   . Thyroid disease     hypothyroidism  . Allergy     allergic rhinitis  . Asthma     mostly in childhood  . BPH (benign prostatic hypertrophy)     Past Surgical History  Procedure Laterality Date  . Vein ligation and stripping  1997  . Cholecystectomy  2006  . Cardiac catheterization  2006    [place]  . Cardiovascular stress test  2006    [place]    Family History  Problem Relation Age of Onset  . Alcohol abuse Father   . Prostate cancer Father   . Alcohol abuse Mother   . Asthma Mother   . Liver cancer Paternal Grandmother   . Colon cancer Neg Hx   . Coronary artery disease Neg Hx   . Hypertension Neg Hx   . Diabetes Neg Hx     History   Social History  . Marital Status: Married    Spouse Name: Ulysses Alper  . Number of Children: 2  . Years of Education: N/A   Occupational History  . 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 Smoking status: Former Smoker   Quit date: 11/26/1978  . Smokeless tobacco: Never Used     Comment: social smoked till 1980's  . Alcohol Use: No     Comment: none since 1984  . Drug Use: No  . Sexual Activity: Not on file   Other Topics Concern  . Not on file   Social History Narrative      Review of Systems No fever Nipple mass did basically go away    Objective:   Physical Exam  Constitutional: He appears well-developed and well-nourished. No distress.  Musculoskeletal:  ~2cm spherical fairly well circumscribed mass laterally on right back ~L5 No inflammation or tenderness          Assessment & Plan:

## 2015-01-17 NOTE — Progress Notes (Signed)
Pre visit review using our clinic review tool, if applicable. No additional management support is needed unless otherwise documented below in the visit note. 

## 2015-01-17 NOTE — Assessment & Plan Note (Signed)
Or much less likely a lipoma Fairly deep but well circumscribed Discussed that he should just leave this alone and let me know if it grows or gets inflamed Would need surgeon to remove if it bothered him

## 2015-05-06 ENCOUNTER — Ambulatory Visit (INDEPENDENT_AMBULATORY_CARE_PROVIDER_SITE_OTHER): Payer: BLUE CROSS/BLUE SHIELD | Admitting: Family Medicine

## 2015-05-06 ENCOUNTER — Encounter: Payer: Self-pay | Admitting: Family Medicine

## 2015-05-06 VITALS — BP 128/58 | HR 64 | Temp 98.1°F | Wt 162.8 lb

## 2015-05-06 DIAGNOSIS — M898X1 Other specified disorders of bone, shoulder: Secondary | ICD-10-CM

## 2015-05-06 DIAGNOSIS — M5412 Radiculopathy, cervical region: Secondary | ICD-10-CM | POA: Diagnosis not present

## 2015-05-06 NOTE — Patient Instructions (Signed)
Look up on Youtube  McKenzie Protocol Cervical Spine    Motrin 600 - 800 mg recommended TID. (Over the counter Motrin, Advil, or Generic Ibuprofen 200 mg tablets. 3-4 tablets by mouth 3 times a day. This equals a prescription strength dose.)   Call me in 3 weeks if you are not getting better.

## 2015-05-06 NOTE — Progress Notes (Signed)
Dr. Frederico Hamman T. Solomiya Pascale, MD, [facility] [place], [address] Phone: [phone] Fax: [phone]  05/06/2015  Patient: Jeffrey Tucker, MRN: [id number], DOB: 12/29/1955, 59 y.o.  Primary Physician:  Viviana Simpler, MD  Chief Complaint: Shoulder Pain  Subjective:   Jeffrey Tucker is a 59 y.o. very pleasant male patient who presents with the following:  L referred shoulder blade pain. Pleasant gentleman who is sustained no particular injury who in the last week has noted that he has pain in his left shoulder blade.  He has not had any particular trauma or injury to the shoulder itself, or his neck.  He has not noticed any change in his grip or strength in his arm.  Primarily locates to the shoulder blade itself.  He is not having pain in the musculature surrounding the parascapular region.  He is also not having a painful arc of motion or other impingement symptoms.  Wed night, noticed some pain in his left shoulder blade region. Yesterday, it got worse, and with certain movement it would hurt a lot. Thought that maybe had a pinched nerve.   Feels a little bit better this morning.   No shoulder pain in the past.  Training related, office-type work.   Past Medical History, Surgical History, Social History, Family History, Problem List, Medications, and Allergies have been reviewed and updated if relevant.  Patient Active Problem List   Diagnosis Date Noted  . Sebaceous cyst 01/17/2015  . Lump of right breast 12/07/2014  . Pleuritic pain 10/19/2014  . Hypertriglyceridemia   . BPH (benign prostatic hypertrophy)   . Ascending aorta dilatation 08/21/2013  . Aortic valve regurgitation 07/20/2011  . Bicuspid aortic valve 07/02/2011  . Routine general medical examination at a health care facility 05/25/2011  . Mild intermittent asthma without complication 40/97/3532  . ALLERGIC RHINITIS 04/27/2009  . Hypothyroidism 04/14/2008      Past Medical History  Diagnosis Date  . Hypertriglyceridemia   . Thyroid disease     hypothyroidism  . Allergy     allergic rhinitis  . Asthma     mostly in childhood  . BPH (benign prostatic hypertrophy)     Past Surgical History  Procedure Laterality Date  . Vein ligation and stripping  1997  . Cholecystectomy  2006  . Cardiac catheterization  2006    [place]  . Cardiovascular stress test  2006    [place]    History   Social History  . Marital Status: Married    Spouse Name: Timon Geissinger  . Number of Children: 2  . Years of Education: N/A   Occupational History  . 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 Smoking status: Former Smoker    Quit date: 11/26/1978  . Smokeless tobacco: Never Used     Comment: social smoked till 1980's  . Alcohol Use: No     Comment: none since 1984  . Drug Use: No  . Sexual Activity: Not on file   Other Topics Concern  . Not on file   Social History Narrative    Family History  Problem Relation Age of Onset  . Alcohol abuse Father   . Prostate cancer Father   . Alcohol abuse Mother   . Asthma Mother   . Liver cancer Paternal Grandmother   . Colon cancer Neg Hx   . Coronary artery disease  Neg Hx   . Hypertension Neg Hx   . Diabetes Neg Hx     Allergies  Allergen Reactions  . Tetracycline Hcl     REACTION: joint pain    Medication list reviewed and updated in full in [place].  GEN: No fevers, chills. Nontoxic. Primarily MSK c/o today. MSK: Detailed in the HPI GI: tolerating PO intake without difficulty Neuro: as above Otherwise the pertinent positives of the ROS are noted above.   Objective:   BP 128/58 mmHg  Pulse 64  Temp(Src) 98.1 F (36.7 C) (Oral)  Wt 162 lb 12 oz (73.823 kg)  SpO2 96%   GEN: WDWN, NAD, Non-toxic, Alert & Oriented x 3 HEENT: Atraumatic, Normocephalic.  Ears and Nose: No external deformity. EXTR: No  clubbing/cyanosis/edema NEURO: Normal gait.  PSYCH: Normally interactive. Conversant. Not depressed or anxious appearing.  Calm demeanor.   Shoulder: L Inspection: No muscle wasting or winging Ecchymosis/edema: neg  AC joint, scapula, clavicle: NT Cervical spine: NT, full ROM - minimal restriction Spurling's: neg Abduction: full, 5/5 Flexion: full, 5/5 IR, full, lift-off: 5/5 ER at neutral: full, 5/5 AC crossover and compression: neg Neer: neg Hawkins: neg Drop Test: neg Empty Can: neg Supraspinatus insertion: NT Bicipital groove: NT Speed's: neg Yergason's: neg Sulcus sign: neg Scapular dyskinesis: none C5-T1 intact Sensation intact Grip 5/5   Radiology: No results found.  Assessment and Plan:   Shoulder blade pain  Left cervical radiculopathy  For age, he has impressively normal shoulder range of motion and function.  Classic referred shoulder blade pain from the spine.  There is absolutely no muscular tenderness in this region.  Nerve irritation.  Began with basic Noah Delaine protocol to try to decompress the spine with anti-inflammatories.  If this that fails, I would use a pulse of some steroids, potentially do a PT referral for formal traction of the cervical region.  Follow-up: prn  Patient Instructions  Look up on Youtube  McKenzie Protocol Cervical Spine    Motrin 600 - 800 mg recommended TID. (Over the counter Motrin, Advil, or Generic Ibuprofen 200 mg tablets. 3-4 tablets by mouth 3 times a day. This equals a prescription strength dose.)   Call me in 3 weeks if you are not getting better.      Signed,  Maud Deed. Daniela Siebers, MD   Patient's Medications  New Prescriptions   No medications on file  Previous Medications   ALBUTEROL (PROAIR HFA) 108 (90 BASE) MCG/ACT INHALER    Inhale 2 puffs into the lungs 3 (three) times daily as needed. Asthma flare up   AMLODIPINE (NORVASC) 5 MG TABLET    Take 1 tablet (5 mg total) by mouth daily.   ASPIRIN 81  MG TABLET    Take 81 mg by mouth daily.   LEVOTHYROXINE (SYNTHROID, LEVOTHROID) 125 MCG TABLET    TAKE 1 TABLET BY MOUTH DAILY  Modified Medications   No medications on file  Discontinued Medications   No medications on file

## 2015-05-06 NOTE — Progress Notes (Signed)
Pre visit review using our clinic review tool, if applicable. No additional management support is needed unless otherwise documented below in the visit note. 

## 2015-09-08 ENCOUNTER — Ambulatory Visit: Payer: BLUE CROSS/BLUE SHIELD | Admitting: Cardiovascular Disease

## 2015-09-09 ENCOUNTER — Encounter: Payer: Self-pay | Admitting: Cardiovascular Disease

## 2015-09-09 ENCOUNTER — Ambulatory Visit (INDEPENDENT_AMBULATORY_CARE_PROVIDER_SITE_OTHER): Payer: BLUE CROSS/BLUE SHIELD | Admitting: Cardiovascular Disease

## 2015-09-09 VITALS — BP 130/42 | HR 52 | Ht 66.0 in | Wt 156.2 lb

## 2015-09-09 DIAGNOSIS — J4520 Mild intermittent asthma, uncomplicated: Secondary | ICD-10-CM

## 2015-09-09 DIAGNOSIS — Q231 Congenital insufficiency of aortic valve: Secondary | ICD-10-CM | POA: Diagnosis not present

## 2015-09-09 DIAGNOSIS — I351 Nonrheumatic aortic (valve) insufficiency: Secondary | ICD-10-CM | POA: Diagnosis not present

## 2015-09-09 DIAGNOSIS — I77810 Thoracic aortic ectasia: Secondary | ICD-10-CM

## 2015-09-09 NOTE — Progress Notes (Signed)
Patient ID: Jeffrey Tucker, male    DOB: [birth year], 59 y.o.   MRN: [id number]  HPI Comments: Jeffrey Tucker is a very pleasant 59 year old gentleman, patient of Dr. Silvio Pate, with moderate aortic valve regurgitation by previous echocardiograms, mild to moderately dilated aortic root and ascending aorta, possible bicuspid aortic valve. He presents for routine followup of his aortic valve regurgitation  In follow-up, he reports that he is doing well. Denies any shortness of breath, chest pain with exertion, feels that he is at his baseline. Active, no complaints Echo  In 2015 showing stable aortic valve regurgitation and stable mild to moderate aorta dilation Over the past several years, there has been no dramatic change in his echocardiogram findings.  EKG on today's visit shows sinus bradycardia with rate 52 bpm, no significant ST or T-wave changes  Other past medical history  echocardiogram in 2014 showed at least moderate aortic valve insufficiency, no dramatic change in his aortic root or ascending aorta. Still mild to moderately dilated, 4.1 cm.(Same as 2013) He has not had an echocardiogram this year but this will be scheduled for today at his request.    Allergies  Allergen Reactions  . Tetracycline Hcl     REACTION: joint pain    Current Outpatient Prescriptions on File Prior to Visit  Medication Sig Dispense Refill  . albuterol (PROAIR HFA) 108 (90 BASE) MCG/ACT inhaler Inhale 2 puffs into the lungs 3 (three) times daily as needed. Asthma flare up 1 Inhaler 1  . amLODipine (NORVASC) 5 MG tablet Take 1 tablet (5 mg total) by mouth daily. 90 tablet 3  . aspirin 81 MG tablet Take 81 mg by mouth daily.    Marland Kitchen levothyroxine (SYNTHROID, LEVOTHROID) 125 MCG tablet TAKE 1 TABLET BY MOUTH DAILY 90 tablet 3   No current facility-administered medications on file prior to visit.    Past Medical History  Diagnosis Date  . Hypertriglyceridemia   . Thyroid disease     hypothyroidism  .  Allergy     allergic rhinitis  . Asthma     mostly in childhood  . BPH (benign prostatic hypertrophy)     Past Surgical History  Procedure Laterality Date  . Vein ligation and stripping  1997  . Cholecystectomy  2006  . Cardiac catheterization  2006    [place]  . Cardiovascular stress test  2006    [place] History  reports that he quit smoking about 36 years ago. He has never used smokeless tobacco. He reports that he does not drink alcohol or use illicit drugs.  Family History family history includes Alcohol abuse in his father and mother; Asthma in his mother; Liver cancer in his paternal grandmother; Prostate cancer in his father. There is no history of Colon cancer, Coronary artery disease, Hypertension, or Diabetes.     Review of Systems  Constitutional: Negative.   Eyes: Negative.   Respiratory: Negative.   Cardiovascular: Negative.   Gastrointestinal: Negative.   Endocrine: Negative.   Musculoskeletal: Negative.   Skin: Negative.   Allergic/Immunologic: Negative.   Neurological: Negative.   Hematological: Negative.   Psychiatric/Behavioral: Negative.   All other systems reviewed and are negative.   BP 130/42 mmHg  Pulse 52  Ht 5\' 6"  (1.676 m)  Wt 156 lb 4 oz (70.875 kg)  BMI 25.23 kg/m2  Physical Exam  Constitutional: He is oriented to person, place, and time. He appears well-developed and well-nourished.  HENT:  Head: Normocephalic.  Nose: Nose  normal.  Mouth/Throat: Oropharynx is clear and moist.  Eyes: Conjunctivae are normal. Pupils are equal, round, and reactive to light.  Neck: Normal range of motion. Neck supple. No JVD present.  Cardiovascular: Normal rate, regular rhythm, S1 normal, S2 normal and intact distal pulses.  Exam reveals no gallop and no friction rub.   Murmur heard.  No systolic murmur is present   Decrescendo diastolic murmur is present with a grade of 2/6  Pulmonary/Chest: Effort normal and breath sounds normal. No  respiratory distress. He has no wheezes. He has no rales. He exhibits no tenderness.  Abdominal: Soft. Bowel sounds are normal. He exhibits no distension. There is no tenderness.  Musculoskeletal: Normal range of motion. He exhibits no edema or tenderness.  Lymphadenopathy:    He has no cervical adenopathy.  Neurological: He is alert and oriented to person, place, and time. Coordination normal.  Skin: Skin is warm and dry. No rash noted. No erythema.  Psychiatric: He has a normal mood and affect. His behavior is normal. Judgment and thought content normal.      Assessment and Plan   Nursing note and vitals reviewed.

## 2015-09-09 NOTE — Patient Instructions (Signed)
You are doing well. No medication changes were made.  Please call us if you have new issues that need to be addressed before your next appt.  Your physician wants you to follow-up in: 12 months.  You will receive a reminder letter in the mail two months in advance. If you don't receive a letter, please call our office to schedule the follow-up appointment. 

## 2015-09-09 NOTE — Assessment & Plan Note (Signed)
Suspected bicuspid aortic valve. No further changes to his medications. No further workup at this time Asymptomatic

## 2015-09-09 NOTE — Assessment & Plan Note (Signed)
He reports having some shortness of breath recently from allergies. Encouraged him to try over-the-counter allergy medication

## 2015-09-09 NOTE — Assessment & Plan Note (Signed)
Repeat echocardiogram in 2017 Continues to be asymptomatic

## 2015-09-09 NOTE — Assessment & Plan Note (Signed)
Mild to moderately dilated aorta. No change in the past 3 years Echocardiogram 2017

## 2015-09-13 ENCOUNTER — Other Ambulatory Visit: Payer: Self-pay | Admitting: Internal Medicine

## 2015-09-20 ENCOUNTER — Encounter: Payer: Self-pay | Admitting: Internal Medicine

## 2015-09-20 ENCOUNTER — Ambulatory Visit (INDEPENDENT_AMBULATORY_CARE_PROVIDER_SITE_OTHER): Payer: BLUE CROSS/BLUE SHIELD | Admitting: Internal Medicine

## 2015-09-20 VITALS — BP 118/60 | HR 56 | Temp 98.2°F | Ht 66.0 in | Wt 157.0 lb

## 2015-09-20 DIAGNOSIS — I77810 Thoracic aortic ectasia: Secondary | ICD-10-CM

## 2015-09-20 DIAGNOSIS — N400 Enlarged prostate without lower urinary tract symptoms: Secondary | ICD-10-CM | POA: Diagnosis not present

## 2015-09-20 DIAGNOSIS — J4520 Mild intermittent asthma, uncomplicated: Secondary | ICD-10-CM | POA: Diagnosis not present

## 2015-09-20 DIAGNOSIS — E039 Hypothyroidism, unspecified: Secondary | ICD-10-CM | POA: Diagnosis not present

## 2015-09-20 DIAGNOSIS — E781 Pure hyperglyceridemia: Secondary | ICD-10-CM

## 2015-09-20 DIAGNOSIS — Z0000 Encounter for general adult medical examination without abnormal findings: Secondary | ICD-10-CM

## 2015-09-20 DIAGNOSIS — R6882 Decreased libido: Secondary | ICD-10-CM | POA: Insufficient documentation

## 2015-09-20 LAB — CBC WITH DIFFERENTIAL/PLATELET
BASOS PCT: 0.7 % (ref 0.0–3.0)
Basophils Absolute: 0 10*3/uL (ref 0.0–0.1)
EOS PCT: 9.6 % — AB (ref 0.0–5.0)
Eosinophils Absolute: 0.7 10*3/uL (ref 0.0–0.7)
HCT: 43.9 % (ref 39.0–52.0)
HEMOGLOBIN: 14.7 g/dL (ref 13.0–17.0)
Lymphocytes Relative: 38.4 % (ref 12.0–46.0)
Lymphs Abs: 2.6 10*3/uL (ref 0.7–4.0)
MCHC: 33.4 g/dL (ref 30.0–36.0)
MCV: 92.7 fl (ref 78.0–100.0)
MONOS PCT: 8.7 % (ref 3.0–12.0)
Monocytes Absolute: 0.6 10*3/uL (ref 0.1–1.0)
Neutro Abs: 2.9 10*3/uL (ref 1.4–7.7)
Neutrophils Relative %: 42.6 % — ABNORMAL LOW (ref 43.0–77.0)
Platelets: 231 10*3/uL (ref 150.0–400.0)
RBC: 4.74 Mil/uL (ref 4.22–5.81)
RDW: 13.9 % (ref 11.5–15.5)
WBC: 6.8 10*3/uL (ref 4.0–10.5)

## 2015-09-20 LAB — LIPID PANEL
CHOL/HDL RATIO: 7
Cholesterol: 187 mg/dL (ref 0–200)
HDL: 28.4 mg/dL — AB (ref >39.00)
NONHDL: 158.52
TRIGLYCERIDES: 313 mg/dL — AB (ref 0.0–149.0)
VLDL: 62.6 mg/dL — ABNORMAL HIGH (ref 0.0–40.0)

## 2015-09-20 LAB — COMPREHENSIVE METABOLIC PANEL
ALBUMIN: 4.3 g/dL (ref 3.5–5.2)
ALK PHOS: 72 U/L (ref 39–117)
ALT: 14 U/L (ref 0–53)
AST: 18 U/L (ref 0–37)
BUN: 15 mg/dL (ref 6–23)
CHLORIDE: 104 meq/L (ref 96–112)
CO2: 31 mEq/L (ref 19–32)
Calcium: 9.6 mg/dL (ref 8.4–10.5)
Creatinine, Ser: 0.84 mg/dL (ref 0.40–1.50)
GFR: 99.15 mL/min (ref >60.00)
Glucose, Bld: 90 mg/dL (ref 70–99)
POTASSIUM: 4.5 meq/L (ref 3.5–5.1)
Sodium: 141 mEq/L (ref 135–145)
TOTAL PROTEIN: 7.1 g/dL (ref 6.0–8.3)
Total Bilirubin: 0.8 mg/dL (ref 0.2–1.2)

## 2015-09-20 LAB — LDL CHOLESTEROL, DIRECT: Direct LDL: 101 mg/dL

## 2015-09-20 LAB — PSA: PSA: 0.56 ng/mL (ref 0.10–4.00)

## 2015-09-20 LAB — TSH: TSH: 1.02 u[IU]/mL (ref 0.35–4.50)

## 2015-09-20 LAB — T4, FREE: Free T4: 1.1 ng/dL (ref 0.60–1.60)

## 2015-09-20 MED ORDER — ALBUTEROL SULFATE HFA 108 (90 BASE) MCG/ACT IN AERS: 2.0000 | INHALATION_SPRAY | Freq: Three times a day (TID) | RESPIRATORY_TRACT | Status: DC | PRN

## 2015-09-20 MED ORDER — SILDENAFIL CITRATE 100 MG PO TABS: 50.0000 mg | ORAL_TABLET | Freq: Every day | ORAL | Status: DC | PRN

## 2015-09-20 NOTE — Assessment & Plan Note (Signed)
Not bothersome enough for Rx

## 2015-09-20 NOTE — Assessment & Plan Note (Signed)
Discussed options Will check testosterone levels but try sildenafil first

## 2015-09-20 NOTE — Assessment & Plan Note (Signed)
Will recheck No meds planned

## 2015-09-20 NOTE — Assessment & Plan Note (Signed)
Seems euthyroid ?Will check labs ?

## 2015-09-20 NOTE — Progress Notes (Signed)
Pre visit review using our clinic review tool, if applicable. No additional management support is needed unless otherwise documented below in the visit note. 

## 2015-09-20 NOTE — Assessment & Plan Note (Signed)
Only uses rescue occasionally---mostly in the fall

## 2015-09-20 NOTE — Assessment & Plan Note (Signed)
Stable Follows with Dr Rockey Situ-- echo due next year

## 2015-09-20 NOTE — Progress Notes (Signed)
Subjective:    Patient ID: Jeffrey Tucker, male    DOB: [birth year], 59 y.o.   MRN: [id number]  HPI Here for physical  Recent visit at cardiologist No problems Deferring echo to next year Continues to exercise-- but now walking   Concerned about a rapid decline in sex drive ED also This is a real problem for his marriage  Still has slow urine flow No dribbling Nocturia x 1-2 and stable No significant daytime symptoms  Current Outpatient Prescriptions on File Prior to Visit  Medication Sig Dispense Refill  . amLODipine (NORVASC) 5 MG tablet TAKE 1 TABLET BY MOUTH DAILY 90 tablet 3  . aspirin 81 MG tablet Take 81 mg by mouth daily.    Marland Kitchen levothyroxine (SYNTHROID, LEVOTHROID) 125 MCG tablet TAKE 1 TABLET BY MOUTH DAILY 90 tablet 3   No current facility-administered medications on file prior to visit.    Allergies  Allergen Reactions  . Tetracycline Hcl     REACTION: joint pain    Past Medical History  Diagnosis Date  . Hypertriglyceridemia   . Thyroid disease     hypothyroidism  . Allergy     allergic rhinitis  . Asthma     mostly in childhood  . BPH (benign prostatic hypertrophy)     Past Surgical History  Procedure Laterality Date  . Vein ligation and stripping  1997  . Cholecystectomy  2006  . Cardiac catheterization  2006    [place]  . Cardiovascular stress test  2006    [place]    Family History  Problem Relation Age of Onset  . Alcohol abuse Father   . Prostate cancer Father   . Alcohol abuse Mother   . Asthma Mother   . Liver cancer Paternal Grandmother   . Colon cancer Neg Hx   . Coronary artery disease Neg Hx   . Hypertension Neg Hx   . Diabetes Neg Hx     Social History   Social History  . Marital Status: Married    Spouse Name: Grae Cannata  . Number of Children: 2  . Years of Education: N/A   Occupational History  . 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 Smoking  status: Former Smoker    Quit date: 11/26/1978  . Smokeless tobacco: Never Used     Comment: social smoked till 1980's  . Alcohol Use: No     Comment: none since 1984  . Drug Use: No  . Sexual Activity: Not on file   Other Topics Concern  . Not on file   Social History Narrative   Review of Systems  Constitutional: Negative for fatigue and unexpected weight change.       Wears seat belt  HENT: Negative for dental problem and tinnitus.        Mild hearing loss with background noise Keeps up with dentist   Eyes: Positive for visual disturbance.       Having cataract surgery on right 12/16, then left 1/17 Dr Talbert Forest in [place]  Respiratory: Negative for cough, chest tightness and shortness of breath.   Cardiovascular: Negative for chest pain and palpitations.       Rare slight ankle swelling on hot days  Gastrointestinal: Negative for nausea, vomiting, abdominal pain, constipation and blood in stool.       No heartburn  Endocrine: Negative for polydipsia and polyuria.  Genitourinary: Positive for difficulty urinating. Negative for frequency.  Musculoskeletal: Negative for back pain, joint swelling and arthralgias.  Skin: Negative for rash.       Cyst at breast is smaller Cyst on back still there--no problems  Allergic/Immunologic: Positive for environmental allergies. Negative for immunocompromised state.  Neurological: Negative for dizziness, syncope, weakness, light-headedness and headaches.  Hematological: Negative for adenopathy. Does not bruise/bleed easily.  Psychiatric/Behavioral: Negative for sleep disturbance and dysphoric mood. The patient is not nervous/anxious.        Objective:   Physical Exam  Constitutional: He is oriented to person, place, and time. He appears well-developed and well-nourished. No distress.  HENT:  Head: Normocephalic and atraumatic.  Right Ear: External ear normal.  Left Ear: External ear normal.  Mouth/Throat: Oropharynx is clear and  moist. No oropharyngeal exudate.  Eyes: Conjunctivae and EOM are normal. Pupils are equal, round, and reactive to light.  Neck: Normal range of motion. Neck supple. No thyromegaly present.  Cardiovascular: Normal rate, regular rhythm and intact distal pulses.   Prominent aortic systolic and diastolic murmurs  Pulmonary/Chest: Effort normal and breath sounds normal. No respiratory distress. He has no wheezes. He has no rales.  Abdominal: Soft. There is no tenderness.  Musculoskeletal: He exhibits no edema or tenderness.  Lymphadenopathy:    He has no cervical adenopathy.  Neurological: He is alert and oriented to person, place, and time.  Skin: No rash noted. No erythema.  Psychiatric: He has a normal mood and affect. His behavior is normal.          Assessment & Plan:

## 2015-09-20 NOTE — Assessment & Plan Note (Signed)
Healthy Prefers no flu vaccine Will check PSA after discussion Colon due 2023

## 2015-09-21 ENCOUNTER — Encounter: Payer: Self-pay | Admitting: Internal Medicine

## 2015-09-21 LAB — TESTOSTERONE,FREE AND TOTAL
TESTOSTERONE FREE: 3.8 pg/mL — AB (ref 7.2–24.0)
TESTOSTERONE: 295 ng/dL — AB (ref 348–1197)

## 2015-09-26 ENCOUNTER — Telehealth: Payer: Self-pay

## 2015-09-26 MED ORDER — SILDENAFIL CITRATE 20 MG PO TABS: 60.0000 mg | ORAL_TABLET | Freq: Every day | ORAL | Status: DC | PRN

## 2015-09-26 NOTE — Telephone Encounter (Signed)
Bea from [facility] said pt request change in med from Viagra 100 mg to Sildenafil 20 mg due to cost of meds. Please advise.

## 2015-09-26 NOTE — Telephone Encounter (Signed)
Rx for lower dose generic sent

## 2015-10-17 ENCOUNTER — Other Ambulatory Visit: Payer: Self-pay | Admitting: Internal Medicine

## 2015-10-18 ENCOUNTER — Encounter: Payer: Self-pay | Admitting: Internal Medicine

## 2015-10-18 ENCOUNTER — Ambulatory Visit (INDEPENDENT_AMBULATORY_CARE_PROVIDER_SITE_OTHER): Payer: BLUE CROSS/BLUE SHIELD | Admitting: Internal Medicine

## 2015-10-18 VITALS — BP 122/48 | HR 56 | Temp 97.5°F | Wt 158.0 lb

## 2015-10-18 DIAGNOSIS — J069 Acute upper respiratory infection, unspecified: Secondary | ICD-10-CM | POA: Diagnosis not present

## 2015-10-18 MED ORDER — AZITHROMYCIN 250 MG PO TABS: ORAL_TABLET | ORAL | Status: DC

## 2015-10-18 NOTE — Progress Notes (Signed)
HPI  Pt presents to the clinic today with c/o runny nose, sore throat and cough. This started 1 week ago. He is blowing yellow mucous out of his nose. The cough is productive of yellow mucous. He has run low grade fevers. He denies chills or body aches. He has tried Delsym with minimal relief. He does have a history of allergies or asthma. He does take Zyrtec daily. He has not had sick contacts that he is aware of. He has not had his flu shot.  Review of Systems      Past Medical History  Diagnosis Date  . Hypertriglyceridemia   . Thyroid disease     hypothyroidism  . Allergy     allergic rhinitis  . Asthma     mostly in childhood  . BPH (benign prostatic hypertrophy)     Family History  Problem Relation Age of Onset  . Alcohol abuse Father   . Prostate cancer Father   . Alcohol abuse Mother   . Asthma Mother   . Liver cancer Paternal Grandmother   . Colon cancer Neg Hx   . Coronary artery disease Neg Hx   . Hypertension Neg Hx   . Diabetes Neg Hx     Social History   Social History  . Marital Status: Married    Spouse Name: Dartavious Hornor  . Number of Children: 2  . Years of Education: N/A   Occupational History  . 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 Smoking status: Former Smoker    Quit date: 11/26/1978  . Smokeless tobacco: Never Used     Comment: social smoked till 1980's  . Alcohol Use: No     Comment: none since 1984  . Drug Use: No  . Sexual Activity: Not on file   Other Topics Concern  . Not on file   Social History Narrative    Allergies  Allergen Reactions  . Tetracycline Hcl     REACTION: joint pain     Constitutional: Positive fever. Denies headache, fatigue, or abrupt weight changes.  HEENT:  Positive runny nose, sore throat. Denies eye redness, eye pain, pressure behind the eyes, facial pain, nasal congestion, ear pain, ringing in the ears, wax buildup, or bloody  nose. Respiratory: Positive cough. Denies difficulty breathing or shortness of breath.  Cardiovascular: Denies chest pain, chest tightness, palpitations or swelling in the hands or feet.   No other specific complaints in a complete review of systems (except as listed in HPI above).  Objective:   BP 122/48 mmHg  Pulse 56  Temp(Src) 97.5 F (36.4 C) (Oral)  Wt 158 lb (71.668 kg)  SpO2 97% Wt Readings from Last 3 Encounters:  10/18/15 158 lb (71.668 kg)  09/20/15 157 lb (71.215 kg)  09/09/15 156 lb 4 oz (70.875 kg)     General: Appears his stated age, ill appearing in NAD. HEENT: Head: normal shape and size, no sinus tenderness noted; Eyes: sclera white, no icterus, conjunctiva pink; Ears: Tm's gray and intact, normal light reflex; Nose: mucosa pink and moist, septum midline; Throat/Mouth:  Teeth present, mucosa erythematous and moist, no exudate noted, no lesions or ulcerations noted.  Neck: No cervical lymphadenopathy.  Cardiovascular: Normal rate and rhythm. S1,S2 noted. Murmur noted.  Pulmonary/Chest: Normal effort and positive vesicular breath sounds. No respiratory distress. No wheezes, rales or ronchi noted.      Assessment & Plan:   Upper Respiratory Infection:  Get some rest and drink plenty of water Do salt water gargles for the sore throat eRx for Azithromax x 5 days Continue Delsym as needed for cough  RTC as needed or if symptoms persist.

## 2015-10-18 NOTE — Patient Instructions (Signed)
Upper Respiratory Infection, Adult Most upper respiratory infections (URIs) are a viral infection of the air passages leading to the lungs. A URI affects the nose, throat, and upper air passages. The most common type of URI is nasopharyngitis and is typically referred to as "the common cold." URIs run their course and usually go away on their own. Most of the time, a URI does not require medical attention, but sometimes a bacterial infection in the upper airways can follow a viral infection. This is called a secondary infection. Sinus and middle ear infections are common types of secondary upper respiratory infections. Bacterial pneumonia can also complicate a URI. A URI can worsen asthma and chronic obstructive pulmonary disease (COPD). Sometimes, these complications can require emergency medical care and may be life threatening.  CAUSES Almost all URIs are caused by viruses. A virus is a type of germ and can spread from one person to another.  RISKS FACTORS You may be at risk for a URI if:   You smoke.   You have chronic heart or lung disease.  You have a weakened defense (immune) system.   You are very young or very old.   You have nasal allergies or asthma.  You work in crowded or poorly ventilated areas.  You work in health care facilities or schools. SIGNS AND SYMPTOMS  Symptoms typically develop 2-3 days after you come in contact with a cold virus. Most viral URIs last 7-10 days. However, viral URIs from the influenza virus (flu virus) can last 14-18 days and are typically more severe. Symptoms may include:   Runny or stuffy (congested) nose.   Sneezing.   Cough.   Sore throat.   Headache.   Fatigue.   Fever.   Loss of appetite.   Pain in your forehead, behind your eyes, and over your cheekbones (sinus pain).  Muscle aches.  DIAGNOSIS  Your health care provider may diagnose a URI by:  Physical exam.  Tests to check that your symptoms are not due to  another condition such as:  Strep throat.  Sinusitis.  Pneumonia.  Asthma. TREATMENT  A URI goes away on its own with time. It cannot be cured with medicines, but medicines may be prescribed or recommended to relieve symptoms. Medicines may help:  Reduce your fever.  Reduce your cough.  Relieve nasal congestion. HOME CARE INSTRUCTIONS   Take medicines only as directed by your health care provider.   Gargle warm saltwater or take cough drops to comfort your throat as directed by your health care provider.  Use a warm mist humidifier or inhale steam from a shower to increase air moisture. This may make it easier to breathe.  Drink enough fluid to keep your urine clear or pale yellow.   Eat soups and other clear broths and maintain good nutrition.   Rest as needed.   Return to work when your temperature has returned to normal or as your health care provider advises. You may need to stay home longer to avoid infecting others. You can also use a face mask and careful hand washing to prevent spread of the virus.  Increase the usage of your inhaler if you have asthma.   Do not use any tobacco products, including cigarettes, chewing tobacco, or electronic cigarettes. If you need help quitting, ask your health care provider. PREVENTION  The best way to protect yourself from getting a cold is to practice good hygiene.   Avoid oral or hand contact with people with cold   symptoms.   Wash your hands often if contact occurs.  There is no clear evidence that vitamin C, vitamin E, echinacea, or exercise reduces the chance of developing a cold. However, it is always recommended to get plenty of rest, exercise, and practice good nutrition.  SEEK MEDICAL CARE IF:   You are getting worse rather than better.   Your symptoms are not controlled by medicine.   You have chills.  You have worsening shortness of breath.  You have brown or red mucus.  You have yellow or brown nasal  discharge.  You have pain in your face, especially when you bend forward.  You have a fever.  You have swollen neck glands.  You have pain while swallowing.  You have white areas in the back of your throat. SEEK IMMEDIATE MEDICAL CARE IF:   You have severe or persistent:  Headache.  Ear pain.  Sinus pain.  Chest pain.  You have chronic lung disease and any of the following:  Wheezing.  Prolonged cough.  Coughing up blood.  A change in your usual mucus.  You have a stiff neck.  You have changes in your:  Vision.  Hearing.  Thinking.  Mood. MAKE SURE YOU:   Understand these instructions.  Will watch your condition.  Will get help right away if you are not doing well or get worse.   This information is not intended to replace advice given to you by your health care provider. Make sure you discuss any questions you have with your health care provider.   Document Released: 05/08/2001 Document Revised: 03/29/2015 Document Reviewed: 02/17/2014 Elsevier Interactive Patient Education 2016 [employer]vier Inc.  

## 2015-10-18 NOTE — Progress Notes (Signed)
Pre visit review using our clinic review tool, if applicable. No additional management support is needed unless otherwise documented below in the visit note. 

## 2015-11-01 ENCOUNTER — Telehealth: Payer: Self-pay

## 2015-11-01 NOTE — Telephone Encounter (Signed)
Received cardiac clearance for pt to proceed w/ cataract extraction w/ intraocular lens implantation of the right eye followed by the left eye on 11/07/15 @ [facility] w/ Dr. Darleen Crocker. Per Dr. Rockey Situ, pt is medically stable and cleared to proceed.  Faxed to [phone].

## 2015-11-07 ENCOUNTER — Encounter: Payer: Self-pay | Admitting: Internal Medicine

## 2016-01-19 ENCOUNTER — Telehealth: Payer: Self-pay

## 2016-01-19 NOTE — Telephone Encounter (Signed)
Pt left v/m; pt has ins thru March and wants to know if can get more than 90 day supply at [facility]; left detailed v/m per DPR that pt can ck with [facility] to see if can get more than 90 day supply with current ins.

## 2016-02-09 ENCOUNTER — Telehealth: Payer: Self-pay | Admitting: Cardiovascular Disease

## 2016-02-09 NOTE — Telephone Encounter (Signed)
Received records request from [employer] , forwarded to [facility] for processing 02/09/16

## 2016-03-02 ENCOUNTER — Telehealth: Payer: Self-pay | Admitting: Cardiovascular Disease

## 2016-03-02 NOTE — Telephone Encounter (Signed)
Received via fax insurance forms from [facility]. Sent to [employer] via interoffice mail.

## 2016-03-15 NOTE — Telephone Encounter (Signed)
Request for status form received via fax from [facility] .  Faxed to [employer].  [phone].

## 2016-08-21 ENCOUNTER — Encounter: Payer: Self-pay | Admitting: Internal Medicine

## 2016-08-21 ENCOUNTER — Ambulatory Visit (INDEPENDENT_AMBULATORY_CARE_PROVIDER_SITE_OTHER): Payer: Self-pay | Admitting: Internal Medicine

## 2016-08-21 DIAGNOSIS — J4520 Mild intermittent asthma, uncomplicated: Secondary | ICD-10-CM | POA: Insufficient documentation

## 2016-08-21 DIAGNOSIS — J4521 Mild intermittent asthma with (acute) exacerbation: Secondary | ICD-10-CM

## 2016-08-21 MED ORDER — MONTELUKAST SODIUM 10 MG PO TABS: 10.0000 mg | ORAL_TABLET | Freq: Every day | ORAL | 11 refills | Status: DC

## 2016-08-21 NOTE — Assessment & Plan Note (Signed)
Has been sneezing for a month May have concomitant viral infection Asked him to restart cetirizine Try montelukast for the allergy season Prednisone if not improving soon

## 2016-08-21 NOTE — Progress Notes (Signed)
Subjective:    Patient ID: Jeffrey Tucker, male    DOB: Apr 16, 1956, 60 y.o.   MRN: [id number]  HPI Here due to respiratory symptoms  Gets a cold typically in fall with change of season Feels it is in his chest Tries the nyquil and OTC cold med Concerned about it provoking his asthma  Started 4-5 days ago Coughed all night last night No wheezing--but using inhaler and nyquil at night (inhaler did help) Slight SOB feeling--but he notes decreased energy as well  No fever Some sweats at night --slight chill as well  No sore throat--just tickly at the beginning No ear pain No nasal congestion or sinus pain  Current Outpatient Prescriptions on File Prior to Visit  Medication Sig Dispense Refill  . albuterol (PROAIR HFA) 108 (90 BASE) MCG/ACT inhaler Inhale 2 puffs into the lungs 3 (three) times daily as needed. Asthma flare up 1 Inhaler 1  . amLODipine (NORVASC) 5 MG tablet TAKE 1 TABLET BY MOUTH DAILY 90 tablet 3  . aspirin 81 MG tablet Take 81 mg by mouth daily.    Marland Kitchen levothyroxine (SYNTHROID, LEVOTHROID) 125 MCG tablet TAKE 1 TABLET BY MOUTH DAILY 90 tablet 3   No current facility-administered medications on file prior to visit.     Allergies  Allergen Reactions  . Tetracycline Hcl     REACTION: joint pain    Past Medical History:  Diagnosis Date  . Allergy    allergic rhinitis  . Asthma    mostly in childhood  . BPH (benign prostatic hypertrophy)   . Hypertriglyceridemia   . Thyroid disease    hypothyroidism    Past Surgical History:  Procedure Laterality Date  . CARDIAC CATHETERIZATION  2006   [place]  . CARDIOVASCULAR STRESS TEST  2006   [place]  . CHOLECYSTECTOMY  2006  . VEIN LIGATION AND STRIPPING  1997    Family History  Problem Relation Age of Onset  . Alcohol abuse Father   . Prostate cancer Father   . Alcohol abuse Mother   . Asthma Mother   . Liver cancer Paternal Grandmother   . Colon cancer Neg Hx   . Coronary artery disease Neg Hx   .  Hypertension Neg Hx   . Diabetes Neg Hx     Social History   Social History  . Marital status: Married    Spouse name: Dequawn Kozikowski  . Number of children: 2  . Years of education: N/A   Occupational History  . 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 Smoking status: Former Smoker    Quit date: 11/26/1978  . Smokeless tobacco: Never Used     Comment: social smoked till 1980's  . Alcohol use No     Comment: none since 1984  . Drug use: No  . Sexual activity: Not on file   Other Topics Concern  . Not on file   Social History Narrative  . No narrative on file   Review of Systems  No rash No vomiting or diarrhea Appetite is off a llittle Ragweed allergy--OTC antihistamines of limited help     Objective:   Physical Exam  Constitutional: He appears well-developed and well-nourished. No distress.  HENT:  Mouth/Throat: Oropharynx is clear and moist. No oropharyngeal exudate.  No sinus tenderness TMs normal Moderate pale nasal congestion  Neck: Normal range of motion. Neck supple. No thyromegaly present.  Pulmonary/Chest: Effort normal and  breath sounds normal. No respiratory distress. He has no wheezes. He has no rales.  Lymphadenopathy:    He has no cervical adenopathy.          Assessment & Plan:

## 2016-08-21 NOTE — Progress Notes (Signed)
Pre visit review using our clinic review tool, if applicable. No additional management support is needed unless otherwise documented below in the visit note. 

## 2016-08-21 NOTE — Patient Instructions (Signed)
Please restart the cetirizine daily during allergy season-- till November or so. Please start the montelukast--- and call me in the next few days if you are worsening (I will start you on prednisone). If you are helped by the montelukast-- stay on it till the end of allergy season.

## 2016-08-24 ENCOUNTER — Telehealth: Payer: Self-pay

## 2016-08-24 MED ORDER — PREDNISONE 20 MG PO TABS: ORAL_TABLET | ORAL | 0 refills | Status: DC

## 2016-08-24 NOTE — Telephone Encounter (Signed)
Pt left v/m; pt still has persistent cough that is worse at night and request different med. Pt was seen 08/21/16. Mandy RN team lead called Dr Silvio Pate;  Dr Silvio Pate wants pt to take prednisone 20 mg # 6 taking 2 tabs po every day for 3 days and to cb on 08/27/16 if not better. Pt voiced understanding.

## 2016-08-28 NOTE — Telephone Encounter (Signed)
Pt took the prednisone and is no better; prednisone was not effective at all for cough. Persistent cough worse at night and affecting pts sleep. Pt request cb with what else can be done.[facility].

## 2016-08-29 MED ORDER — AMOXICILLIN-POT CLAVULANATE 875-125 MG PO TABS: 1.0000 | ORAL_TABLET | Freq: Two times a day (BID) | ORAL | 0 refills | Status: AC

## 2016-08-29 NOTE — Telephone Encounter (Signed)
Let him know I sent a prescription for an antibiotic for him to try. He should come back in for reevaluation if he is not improving over the next few days, or develops any significant fever or shortness of breath

## 2016-08-29 NOTE — Addendum Note (Signed)
Addended by: Viviana Simpler I on: 08/29/2016 07:46 AM   Modules accepted: Orders

## 2016-08-29 NOTE — Telephone Encounter (Signed)
Spoke to pt. Advised him to try antibiotic and make an OV if no improvement

## 2016-08-31 ENCOUNTER — Encounter: Payer: Self-pay | Admitting: Family Medicine

## 2016-08-31 ENCOUNTER — Telehealth: Payer: Self-pay | Admitting: Internal Medicine

## 2016-08-31 ENCOUNTER — Ambulatory Visit (INDEPENDENT_AMBULATORY_CARE_PROVIDER_SITE_OTHER): Payer: Self-pay | Admitting: Family Medicine

## 2016-08-31 ENCOUNTER — Ambulatory Visit (INDEPENDENT_AMBULATORY_CARE_PROVIDER_SITE_OTHER)
Admission: RE | Admit: 2016-08-31 | Discharge: 2016-08-31 | Disposition: A | Discharge status: Home / Self Care | Payer: Self-pay | Source: Ambulatory Visit | Attending: Family Medicine | Admitting: Family Medicine

## 2016-08-31 VITALS — BP 152/42 | HR 67 | Temp 98.6°F | Wt 155.5 lb

## 2016-08-31 DIAGNOSIS — R05 Cough: Secondary | ICD-10-CM

## 2016-08-31 DIAGNOSIS — R059 Cough, unspecified: Secondary | ICD-10-CM

## 2016-08-31 NOTE — Progress Notes (Signed)
Pre visit review using our clinic review tool, if applicable. No additional management support is needed unless otherwise documented below in the visit note. 

## 2016-08-31 NOTE — Telephone Encounter (Signed)
error 

## 2016-08-31 NOTE — Progress Notes (Signed)
H/o asthma.  H/o fall sx, at this time of year.  Prev on prednisone, done with steroid course.  Still on augmentin.    No fevers.  Still coughing (x2 weeks).  Some sputum, usually yellowish.  Only on abx for about 5 doses so far.  Laying down will cause the cough worse.  Has slept sitting up to help with cough.  No rhinorrhea.  No ear pain, no ST.  Minimal wheeze.  Still on baseline meds.  Fatigued.  He can still speak in complete sentences but he doesn't feel like he can take his baseline full deep breath.    Meds, vitals, and allergies reviewed.   ROS: Per HPI unless specifically indicated in ROS section   GEN: nad, alert and oriented HEENT: mucous membranes moist, tm w/o erythema, nasal exam w/o erythema, clear discharge noted,  OP with cobblestoning NECK: supple w/o LA CV: rrr.   PULM: Speaking in complete sentences easily at [place].   Rhonchi on the RUL, no wheeze o/w.  ctab o/w.   EXT: no edema SKIN: no acute rash

## 2016-08-31 NOTE — Patient Instructions (Signed)
Finish the antibiotics and use your inhaler and I think you will gradually improve.  Take care.  Glad to see you.  Update [place] as needed.

## 2016-09-01 DIAGNOSIS — R059 Cough, unspecified: Secondary | ICD-10-CM | POA: Insufficient documentation

## 2016-09-01 DIAGNOSIS — R05 Cough: Secondary | ICD-10-CM | POA: Insufficient documentation

## 2016-09-01 NOTE — Assessment & Plan Note (Signed)
Previous note from PCP reviewed. Had been on prednisone. Recently started on Augmentin. Chest x-ray done today. No acute infiltrate. I read the film and I agree with radiology. I compared it to the old films available, no significant change from prior. Discussed with patient. No radiographic evidence of pneumonia currently. He could have bronchitis, this would not yet had time to resolve. Would continue Augmentin. Continue his other baseline medications. Nontoxic. Okay for outpatient follow-up. Update [place] as needed. >25 minutes spent in face to face time with patient, >50% spent in counselling or coordination of care.

## 2016-09-07 ENCOUNTER — Encounter: Payer: Self-pay | Admitting: Internal Medicine

## 2016-09-07 ENCOUNTER — Ambulatory Visit (INDEPENDENT_AMBULATORY_CARE_PROVIDER_SITE_OTHER): Payer: Self-pay | Admitting: Internal Medicine

## 2016-09-07 VITALS — BP 138/60 | HR 62 | Temp 98.1°F | Resp 20 | Wt 157.0 lb

## 2016-09-07 DIAGNOSIS — R05 Cough: Secondary | ICD-10-CM

## 2016-09-07 DIAGNOSIS — R059 Cough, unspecified: Secondary | ICD-10-CM

## 2016-09-07 MED ORDER — HYDROCODONE-HOMATROPINE 5-1.5 MG/5ML PO SYRP: 5.0000 mL | ORAL_SOLUTION | Freq: Every evening | ORAL | 0 refills | Status: DC | PRN

## 2016-09-07 MED ORDER — AZITHROMYCIN 250 MG PO TABS: ORAL_TABLET | ORAL | 0 refills | Status: DC

## 2016-09-07 NOTE — Progress Notes (Signed)
Subjective:    Patient ID: Jeffrey Tucker, male    DOB: Nov 16, 1956, 60 y.o.   MRN: [id number]  HPI Here due to ongoing illness Still coughing---but it is a different cough Spells in daytime--but really bad at night Has to sleep in recliner (upright)  Breathing is not normal--- knows he doesn't have normal "lung capacity" Prednisone didn't help augmentin didn't help No sweats or chills at night (has intermittent sweats anyway)  No rhinorrhea No ear pain No sore throat No headache  Current Outpatient Prescriptions on File Prior to Visit  Medication Sig Dispense Refill  . albuterol (PROAIR HFA) 108 (90 BASE) MCG/ACT inhaler Inhale 2 puffs into the lungs 3 (three) times daily as needed. Asthma flare up 1 Inhaler 1  . amLODipine (NORVASC) 5 MG tablet TAKE 1 TABLET BY MOUTH DAILY 90 tablet 3  . amoxicillin-clavulanate (AUGMENTIN) 875-125 MG tablet Take 1 tablet by mouth 2 (two) times daily. 20 tablet 0  . aspirin 81 MG tablet Take 81 mg by mouth daily.    Marland Kitchen levothyroxine (SYNTHROID, LEVOTHROID) 125 MCG tablet TAKE 1 TABLET BY MOUTH DAILY 90 tablet 3  . montelukast (SINGULAIR) 10 MG tablet Take 1 tablet (10 mg total) by mouth at bedtime. 30 tablet 11   No current facility-administered medications on file prior to visit.     Allergies  Allergen Reactions  . Tetracycline Hcl     REACTION: joint pain    Past Medical History:  Diagnosis Date  . Allergy    allergic rhinitis  . Asthma    mostly in childhood  . BPH (benign prostatic hypertrophy)   . Hypertriglyceridemia   . Thyroid disease    hypothyroidism    Past Surgical History:  Procedure Laterality Date  . CARDIAC CATHETERIZATION  2006   [place]  . CARDIOVASCULAR STRESS TEST  2006   [place]  . CHOLECYSTECTOMY  2006  . VEIN LIGATION AND STRIPPING  1997    Family History  Problem Relation Age of Onset  . Alcohol abuse Father   . Prostate cancer Father   . Alcohol abuse Mother   . Asthma Mother   . Liver  cancer Paternal Grandmother   . Colon cancer Neg Hx   . Coronary artery disease Neg Hx   . Hypertension Neg Hx   . Diabetes Neg Hx     Social History   Social History  . Marital status: Married    Spouse name: Ignace Takemura  . Number of children: 2  . Years of education: N/A   Occupational History  . 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 Commercial real estate    Social History Main Topics  . Smoking status: Former Smoker    Quit date: 11/26/1978  . Smokeless tobacco: Never Used     Comment: social smoked till 1980's  . Alcohol use No     Comment: none since 1984  . Drug use: No  . Sexual activity: Not on file   Other Topics Concern  . Not on file   Social History Narrative  . No narrative on file   Review of Systems  Energy levels down Appetite poor--has lost 3# or so No vomiting or diarrhea     Objective:   Physical Exam  Constitutional: He appears well-developed and well-nourished. No distress.  HENT:  Mouth/Throat: Oropharynx is clear and moist. No oropharyngeal exudate.  No sinus tenderness Mild pale nasal congestion on  right TMs normal   Neck: Normal range of motion. Neck supple. No thyromegaly present.  Pulmonary/Chest: No respiratory distress. He has no rales.  occ coarse cough Normal air movement but slightly prolonged exp phase, slight wheeze in lower lobes  Lymphadenopathy:    He has no cervical adenopathy.          Assessment & Plan:

## 2016-09-07 NOTE — Progress Notes (Signed)
Pre visit review using our clinic review tool, if applicable. No additional management support is needed unless otherwise documented below in the visit note. 

## 2016-09-07 NOTE — Assessment & Plan Note (Signed)
Seems to be from atypical infection CXR fine Prednisone didn't help Discussed ~6 week duration z-pak to see if it helps Hydrocodone cough syrup at night

## 2016-09-10 ENCOUNTER — Ambulatory Visit: Payer: BLUE CROSS/BLUE SHIELD | Admitting: Cardiovascular Disease

## 2016-09-11 ENCOUNTER — Ambulatory Visit: Payer: BLUE CROSS/BLUE SHIELD | Admitting: Cardiovascular Disease

## 2016-09-25 ENCOUNTER — Encounter: Payer: BLUE CROSS/BLUE SHIELD | Admitting: Internal Medicine

## 2016-09-28 ENCOUNTER — Ambulatory Visit (INDEPENDENT_AMBULATORY_CARE_PROVIDER_SITE_OTHER): Payer: Self-pay | Admitting: Family Medicine

## 2016-09-28 ENCOUNTER — Encounter: Payer: Self-pay | Admitting: Family Medicine

## 2016-09-28 VITALS — BP 144/42 | HR 54 | Temp 97.9°F | Wt 157.2 lb

## 2016-09-28 DIAGNOSIS — Z7189 Other specified counseling: Secondary | ICD-10-CM

## 2016-09-28 DIAGNOSIS — Z125 Encounter for screening for malignant neoplasm of prostate: Secondary | ICD-10-CM

## 2016-09-28 DIAGNOSIS — R05 Cough: Secondary | ICD-10-CM

## 2016-09-28 DIAGNOSIS — J45909 Unspecified asthma, uncomplicated: Secondary | ICD-10-CM

## 2016-09-28 DIAGNOSIS — R059 Cough, unspecified: Secondary | ICD-10-CM

## 2016-09-28 DIAGNOSIS — I10 Essential (primary) hypertension: Secondary | ICD-10-CM

## 2016-09-28 DIAGNOSIS — N400 Enlarged prostate without lower urinary tract symptoms: Secondary | ICD-10-CM

## 2016-09-28 DIAGNOSIS — Z119 Encounter for screening for infectious and parasitic diseases, unspecified: Secondary | ICD-10-CM

## 2016-09-28 DIAGNOSIS — E039 Hypothyroidism, unspecified: Secondary | ICD-10-CM

## 2016-09-28 DIAGNOSIS — Q231 Congenital insufficiency of aortic valve: Secondary | ICD-10-CM

## 2016-09-28 LAB — COMPREHENSIVE METABOLIC PANEL
ALBUMIN: 4.3 g/dL (ref 3.5–5.2)
ALK PHOS: 77 U/L (ref 39–117)
ALT: 16 U/L (ref 0–53)
AST: 19 U/L (ref 0–37)
BUN: 14 mg/dL (ref 6–23)
CO2: 29 mEq/L (ref 19–32)
CREATININE: 0.8 mg/dL (ref 0.40–1.50)
Calcium: 9.4 mg/dL (ref 8.4–10.5)
Chloride: 105 mEq/L (ref 96–112)
GFR: 104.53 mL/min (ref >60.00)
GLUCOSE: 81 mg/dL (ref 70–99)
POTASSIUM: 4.1 meq/L (ref 3.5–5.1)
SODIUM: 140 meq/L (ref 135–145)
TOTAL PROTEIN: 7.1 g/dL (ref 6.0–8.3)
Total Bilirubin: 0.7 mg/dL (ref 0.2–1.2)

## 2016-09-28 LAB — LIPID PANEL
CHOL/HDL RATIO: 6
Cholesterol: 189 mg/dL (ref 0–200)
HDL: 33.2 mg/dL — ABNORMAL LOW (ref >39.00)
Triglycerides: 436 mg/dL — ABNORMAL HIGH (ref 0.0–149.0)

## 2016-09-28 LAB — LDL CHOLESTEROL, DIRECT: LDL DIRECT: 76 mg/dL

## 2016-09-28 LAB — TSH: TSH: 1.42 u[IU]/mL (ref 0.35–4.50)

## 2016-09-28 LAB — PSA: PSA: 0.46 ng/mL (ref 0.10–4.00)

## 2016-09-28 MED ORDER — ALBUTEROL SULFATE HFA 108 (90 BASE) MCG/ACT IN AERS: 2.0000 | INHALATION_SPRAY | Freq: Three times a day (TID) | RESPIRATORY_TRACT | 2 refills | Status: DC | PRN

## 2016-09-28 MED ORDER — AMLODIPINE BESYLATE 5 MG PO TABS: 5.0000 mg | ORAL_TABLET | Freq: Every day | ORAL | 3 refills | Status: DC

## 2016-09-28 MED ORDER — LEVOTHYROXINE SODIUM 125 MCG PO TABS: 125.0000 ug | ORAL_TABLET | Freq: Every day | ORAL | 3 refills | Status: DC

## 2016-09-28 NOTE — Patient Instructions (Addendum)
Check with your pharmacy to see how much it would cost to get the shingles and flu shots.  You can call the health department for HIV testing Address: [address], [place], [place] [address] Phone: [phone].  We'll contact you with your lab report. Don't change your meds for now.  Take care.  Glad to see you.

## 2016-09-28 NOTE — Progress Notes (Signed)
Flu d/w pt- this may be cheaper for the patient at his pharmacy. Shingles d/w pt.  See AVS.   PSA pending.   Living will d/w pt. Wife designated if patient were incapacitated.   D/w pt about HIV screening.  See AVS.   He can get this done at the health department for free. Pt opts in for HCV screening.  D/w pt re: routine screening.   Hypertension:    Using medication without problems or lightheadedness: yes Chest pain with exertion:no Edema:no Short of breath:no Diet and exercise d/w pt. Doing okay with both, encouraged both.   Labs are pending.   Hypothyroidism.  Compliant with med.  Due for TSH.  No ADE on med.  No dysphagia.  No neck mass.    Asthma.  Controlled with meds. Compliant.  No ADE on med.    Cough is better in the meantime, not fully resolved but clearly better in the meantime.  His energy level is better.   PMH and SH reviewed  Meds, vitals, and allergies reviewed.   ROS: Per HPI.  Unless specifically indicated otherwise in HPI, the patient denies:  General: fever. Eyes: acute vision changes ENT: sore throat Cardiovascular: chest pain Respiratory: SOB GI: vomiting GU: dysuria Musculoskeletal: acute back pain Derm: acute rash Neuro: acute motor dysfunction Psych: worsening mood Endocrine: polydipsia Heme: bleeding Allergy: hayfever  GEN: nad, alert and oriented HEENT: mucous membranes moist NECK: supple w/o LA, no thyromegaly on exam. He does have a prominent bilateral carotid pulse noted.  This is symmetric CV: rrr. murmur noted.  PULM: ctab, no inc wob ABD: soft, +bs EXT: no edema SKIN: no acute rash

## 2016-09-28 NOTE — Progress Notes (Signed)
Pre visit review using our clinic review tool, if applicable. No additional management support is needed unless otherwise documented below in the visit note. 

## 2016-09-29 LAB — HEPATITIS C ANTIBODY: HCV AB: NEGATIVE

## 2016-09-30 ENCOUNTER — Other Ambulatory Visit: Payer: Self-pay | Admitting: Family Medicine

## 2016-09-30 ENCOUNTER — Encounter: Payer: Self-pay | Admitting: Family Medicine

## 2016-09-30 DIAGNOSIS — E781 Pure hyperglyceridemia: Secondary | ICD-10-CM

## 2016-09-30 DIAGNOSIS — Z7189 Other specified counseling: Secondary | ICD-10-CM | POA: Insufficient documentation

## 2016-09-30 NOTE — Assessment & Plan Note (Signed)
Hepatitis C pending. He can get HIV screening done at the health department. That would be cheaper for him. See after visit summary.

## 2016-09-30 NOTE — Assessment & Plan Note (Signed)
Controlled.  Continue current medications.

## 2016-09-30 NOTE — Assessment & Plan Note (Signed)
Living will d/w pt.  Wife designated if patient were incapacitated.   ?

## 2016-09-30 NOTE — Assessment & Plan Note (Signed)
See notes on labs. No change in medicines at this point.

## 2016-09-30 NOTE — Assessment & Plan Note (Addendum)
I will route this to cardiology for input. I don't know if this is related to his prominent bilateral carotid pulse. I will defer to cardiology. See notes on labs. Blood pressure is controlled.

## 2016-09-30 NOTE — Assessment & Plan Note (Signed)
PSA pending.  See notes on labs. 

## 2016-09-30 NOTE — Assessment & Plan Note (Signed)
Clearly improved. Continue as is. He agrees.

## 2016-10-01 ENCOUNTER — Encounter: Payer: Self-pay | Admitting: Family Medicine

## 2016-10-01 NOTE — Telephone Encounter (Signed)
Please note the HIV testing--he shouldn't need this again. The zostavax may not be cheaper even when he goes on Medicare--but that depends on the plan. See if he wants the flu shot here

## 2016-10-02 ENCOUNTER — Telehealth: Payer: Self-pay | Admitting: Cardiovascular Disease

## 2016-10-02 NOTE — Telephone Encounter (Signed)
lmov for patient to schedule echo

## 2016-10-02 NOTE — Telephone Encounter (Signed)
-----   Message from Stana Bunting, RN sent at 10/02/2016 10:55 AM EST -----   ----- Message ----- From: Minna Merritts, MD Sent: 10/02/2016  10:31 AM To: Tonia Ghent, MD, Stana Bunting, RN  He is due for repeat echo to look at size of aortic root, severity of aortic valve regurgitation  Had been stable 2013 up to 2015 But still needs to be watched  every 2 years at least plan was to look in 2017 I will have our people call him to see what he would like to do thx TG  ----- Message ----- From: Tonia Ghent, MD Sent: 09/30/2016   2:20 PM To: Minna Merritts, MD  The patient has a prominent bilateral carotid pulse. This is symmetric. He is thin. He has known bicuspid aortic valve. How often do see this? Would like him to come see you sooner than he is currently scheduled? Please let me know. Thanks.   Brigitte Pulse

## 2016-11-01 ENCOUNTER — Encounter: Payer: Self-pay | Admitting: Internal Medicine

## 2016-11-20 ENCOUNTER — Encounter: Payer: Self-pay | Admitting: Internal Medicine

## 2016-11-20 ENCOUNTER — Ambulatory Visit (INDEPENDENT_AMBULATORY_CARE_PROVIDER_SITE_OTHER): Payer: Self-pay | Admitting: Internal Medicine

## 2016-11-20 VITALS — BP 150/70 | HR 62 | Temp 97.6°F | Wt 162.0 lb

## 2016-11-20 DIAGNOSIS — J4531 Mild persistent asthma with (acute) exacerbation: Secondary | ICD-10-CM

## 2016-11-20 DIAGNOSIS — R1032 Left lower quadrant pain: Secondary | ICD-10-CM

## 2016-11-20 MED ORDER — PREDNISONE 20 MG PO TABS: 40.0000 mg | ORAL_TABLET | Freq: Every day | ORAL | 0 refills | Status: DC

## 2016-11-20 MED ORDER — CEFUROXIME AXETIL 250 MG PO TABS: 250.0000 mg | ORAL_TABLET | Freq: Two times a day (BID) | ORAL | 0 refills | Status: DC

## 2016-11-20 MED ORDER — METHYLPREDNISOLONE ACETATE 80 MG/ML IJ SUSP
80.0000 mg | Freq: Once | INTRAMUSCULAR | Status: AC
  Administered 2016-11-20: 80 mg via INTRAMUSCULAR

## 2016-11-20 MED ORDER — ALBUTEROL SULFATE (2.5 MG/3ML) 0.083% IN NEBU
2.5000 mg | INHALATION_SOLUTION | Freq: Once | RESPIRATORY_TRACT | Status: AC
  Administered 2016-11-20: 2.5 mg via RESPIRATORY_TRACT

## 2016-11-20 MED ORDER — FLUTICASONE-SALMETEROL 100-50 MCG/DOSE IN AEPB: 1.0000 | INHALATION_SPRAY | Freq: Two times a day (BID) | RESPIRATORY_TRACT | 3 refills | Status: DC

## 2016-11-20 NOTE — Progress Notes (Signed)
Pre visit review using our clinic review tool, if applicable. No additional management support is needed unless otherwise documented below in the visit note. 

## 2016-11-20 NOTE — Assessment & Plan Note (Signed)
Not clearly internal ?from the ongoing cough Will just observe for now

## 2016-11-20 NOTE — Progress Notes (Signed)
Subjective:    Patient ID: Jeffrey Tucker, male    DOB: Nov 10, 1956, 60 y.o.   MRN: [id number]  HPI Here for persistent cough  Goes back over 3 months Has been using the inhaler "all the time"--- helps but only temporarily Wheezing--especially at night Feels that it is the asthma Little energy due to this--trouble even going up the stairs  No fever Cough is minimally productive--occasional yellow sputum (a lot in AM) Hospitalized as child for the asthma--nothing as adult Was hospitalized last ~20 years ago in fall (his worst season)  Also having abdominal pain in LLQ for a couple of weeks Intermittent--not clearly related to eating Dull type of pain--like ache Tried ibuprofen 200mg  last night-- may have helped Appetite is okay Bowels are less regular lately-- has to strain at times. No blood in stool  Current Outpatient Prescriptions on File Prior to Visit  Medication Sig Dispense Refill  . albuterol (PROAIR HFA) 108 (90 Base) MCG/ACT inhaler Inhale 2 puffs into the lungs 3 (three) times daily as needed. Asthma flare up 1 Inhaler 2  . amLODipine (NORVASC) 5 MG tablet Take 1 tablet (5 mg total) by mouth daily. 90 tablet 3  . aspirin 81 MG tablet Take 81 mg by mouth daily.    Marland Kitchen levothyroxine (SYNTHROID, LEVOTHROID) 125 MCG tablet Take 1 tablet (125 mcg total) by mouth daily. 90 tablet 3  . montelukast (SINGULAIR) 10 MG tablet Take 1 tablet (10 mg total) by mouth at bedtime. (Patient not taking: Reported on 11/20/2016) 30 tablet 11   No current facility-administered medications on file prior to visit.     Allergies  Allergen Reactions  . Tetracycline Hcl     REACTION: joint pain    Past Medical History:  Diagnosis Date  . Allergy    allergic rhinitis  . Asthma    mostly in childhood  . BPH (benign prostatic hypertrophy)   . Hypertension   . Hypertriglyceridemia   . Thyroid disease    hypothyroidism    Past Surgical History:  Procedure Laterality Date  . CARDIAC  CATHETERIZATION  2006   [place]  . CARDIOVASCULAR STRESS TEST  2006   [place]  . CHOLECYSTECTOMY  2006  . VEIN LIGATION AND STRIPPING  1997    Family History  Problem Relation Age of Onset  . Alcohol abuse Father   . Prostate cancer Father   . Alcohol abuse Mother   . Asthma Mother   . Liver cancer Paternal Grandmother   . Colon cancer Neg Hx   . Coronary artery disease Neg Hx   . Hypertension Neg Hx   . Diabetes Neg Hx     Social History   Social History  . Marital status: Married    Spouse name: Jeffrey Tucker  . Number of children: 2  . Years of education: N/A   Occupational History  . 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 Commercial real estate    Social History Main Topics  . Smoking status: Former Smoker    Quit date: 11/26/1978  . Smokeless tobacco: Never Used     Comment: social smoked till 1980's  . Alcohol use No     Comment: none since 1984  . Drug use: No  . Sexual activity: Not on file   Other Topics Concern  . Not on file   Social History Narrative  . No narrative on file   Review of Systems  No heartburn or dysphagia Runny left eye--no itching in nose or ears Some sneezing    Objective:   Physical Exam  Constitutional: He appears well-developed and well-nourished. No distress.  HENT:  Mouth/Throat: Oropharynx is clear and moist. No oropharyngeal exudate.  No sinus tenderness Moderate pale nasal swelling TMs normal  Cardiovascular:  Prominent aortic systolic murmur and decrescendo diastolic aortic murmur  Pulmonary/Chest:  Tight cough Some wheezing Mildly prolonged exp phase Slight decreased breath sounds  Abdominal: Soft. He exhibits no distension. There is no tenderness. There is no rebound and no guarding.          Assessment & Plan:

## 2016-11-20 NOTE — Addendum Note (Signed)
Addended by: Pilar Grammes on: 11/20/2016 02:35 PM   Modules accepted: Orders

## 2016-11-20 NOTE — Assessment & Plan Note (Addendum)
Ongoing symptoms No clear infection Spirometry shows mild restrictive pattern-- but did improve some with bronchodilator  Will give antibiotic depomedrol 80mg  IM now Will give an additional 5 days of prednisone Start controller med--advair Pulmonary referral if not improving

## 2016-11-28 ENCOUNTER — Encounter: Payer: Self-pay | Admitting: Internal Medicine

## 2016-11-30 ENCOUNTER — Other Ambulatory Visit: Payer: Self-pay

## 2016-11-30 DIAGNOSIS — I351 Nonrheumatic aortic (valve) insufficiency: Secondary | ICD-10-CM

## 2016-12-14 ENCOUNTER — Ambulatory Visit (INDEPENDENT_AMBULATORY_CARE_PROVIDER_SITE_OTHER): Payer: Self-pay

## 2016-12-14 ENCOUNTER — Other Ambulatory Visit: Payer: Self-pay

## 2016-12-14 DIAGNOSIS — I351 Nonrheumatic aortic (valve) insufficiency: Secondary | ICD-10-CM

## 2016-12-18 ENCOUNTER — Ambulatory Visit: Payer: Self-pay | Admitting: Cardiovascular Disease

## 2016-12-19 ENCOUNTER — Ambulatory Visit: Payer: Self-pay | Admitting: Cardiovascular Disease

## 2016-12-21 ENCOUNTER — Ambulatory Visit (INDEPENDENT_AMBULATORY_CARE_PROVIDER_SITE_OTHER): Payer: Self-pay | Admitting: Internal Medicine

## 2016-12-21 ENCOUNTER — Encounter: Payer: Self-pay | Admitting: Internal Medicine

## 2016-12-21 VITALS — BP 148/52 | HR 51 | Temp 98.1°F | Wt 159.0 lb

## 2016-12-21 DIAGNOSIS — Z23 Encounter for immunization: Secondary | ICD-10-CM

## 2016-12-21 DIAGNOSIS — J4520 Mild intermittent asthma, uncomplicated: Secondary | ICD-10-CM

## 2016-12-21 DIAGNOSIS — R1032 Left lower quadrant pain: Secondary | ICD-10-CM

## 2016-12-21 NOTE — Progress Notes (Signed)
Pre visit review using our clinic review tool, if applicable. No additional management support is needed unless otherwise documented below in the visit note. 

## 2016-12-21 NOTE — Progress Notes (Signed)
Subjective:    Patient ID: Jeffrey Tucker, male    DOB: November 10, 1956, 61 y.o.   MRN: [id number]  HPI Here for follow up of asthma  He felt better after 1 week on the advair Back to normal on the prednisone after 1 day Then skipped the advair after 7-10 days and hasn't noted any change  No recurrent cough No wheezing  No SOB  Ongoing LLQ abdominal pain Comes and goes---gone now but bad yesterday (needed ibuprofen --?any help) Not clearly related to eating Bowels have been okay--fairly regular but maybe a slight change No N/V  Current Outpatient Prescriptions on File Prior to Visit  Medication Sig Dispense Refill  . albuterol (PROAIR HFA) 108 (90 Base) MCG/ACT inhaler Inhale 2 puffs into the lungs 3 (three) times daily as needed. Asthma flare up 1 Inhaler 2  . amLODipine (NORVASC) 5 MG tablet Take 1 tablet (5 mg total) by mouth daily. 90 tablet 3  . aspirin 81 MG tablet Take 81 mg by mouth daily.    Marland Kitchen levothyroxine (SYNTHROID, LEVOTHROID) 125 MCG tablet Take 1 tablet (125 mcg total) by mouth daily. 90 tablet 3   No current facility-administered medications on file prior to visit.     Allergies  Allergen Reactions  . Tetracycline Hcl     REACTION: joint pain    Past Medical History:  Diagnosis Date  . Allergy    allergic rhinitis  . Asthma    mostly in childhood  . BPH (benign prostatic hypertrophy)   . Hypertension   . Hypertriglyceridemia   . Thyroid disease    hypothyroidism    Past Surgical History:  Procedure Laterality Date  . CARDIAC CATHETERIZATION  2006   [place]  . CARDIOVASCULAR STRESS TEST  2006   [place]  . CHOLECYSTECTOMY  2006  . VEIN LIGATION AND STRIPPING  1997    Family History  Problem Relation Age of Onset  . Alcohol abuse Father   . Prostate cancer Father   . Alcohol abuse Mother   . Asthma Mother   . Liver cancer Paternal Grandmother   . Colon cancer Neg Hx   . Coronary artery disease Neg Hx   . Hypertension Neg Hx   . Diabetes  Neg Hx     Social History   Social History  . Marital status: Married    Spouse name: Kamarie Kammeyer  . Number of children: 2  . Years of education: N/A   Occupational History  . 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 Commercial real estate    Social History Main Topics  . Smoking status: Former Smoker    Quit date: 11/26/1978  . Smokeless tobacco: Never Used     Comment: social smoked till 1980's  . Alcohol use No     Comment: none since 1984  . Drug use: No  . Sexual activity: Not on file   Other Topics Concern  . Not on file   Social History Narrative  . No narrative on file   Review of Systems  Not sick No fever Appetite is okay--except down some when he has the pain     Objective:   Physical Exam  Pulmonary/Chest: Breath sounds normal. No respiratory distress. He has no wheezes. He has no rales.  Abdominal: Soft. Bowel sounds are normal. He exhibits no distension. There is no tenderness. There is no rebound and no guarding.  Assessment & Plan:

## 2016-12-21 NOTE — Patient Instructions (Signed)
Please let me know if the abdominal pain isn't gone, or at least much better, over the next month or so.

## 2016-12-21 NOTE — Assessment & Plan Note (Signed)
Overall better but still some intermittent symptoms No other worrisome features Will continue to observe--if persists over a month, will check CT scan

## 2016-12-21 NOTE — Assessment & Plan Note (Signed)
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

## 2016-12-27 ENCOUNTER — Encounter: Payer: Self-pay | Admitting: Cardiovascular Disease

## 2016-12-27 ENCOUNTER — Ambulatory Visit (INDEPENDENT_AMBULATORY_CARE_PROVIDER_SITE_OTHER): Payer: Self-pay | Admitting: Cardiovascular Disease

## 2016-12-27 VITALS — BP 158/60 | HR 53 | Ht 66.0 in | Wt 159.8 lb

## 2016-12-27 DIAGNOSIS — R001 Bradycardia, unspecified: Secondary | ICD-10-CM

## 2016-12-27 DIAGNOSIS — I77810 Thoracic aortic ectasia: Secondary | ICD-10-CM

## 2016-12-27 DIAGNOSIS — I351 Nonrheumatic aortic (valve) insufficiency: Secondary | ICD-10-CM

## 2016-12-27 DIAGNOSIS — Q231 Congenital insufficiency of aortic valve: Secondary | ICD-10-CM

## 2016-12-27 MED ORDER — AMLODIPINE BESYLATE 10 MG PO TABS: 10.0000 mg | ORAL_TABLET | Freq: Every day | ORAL | 3 refills | Status: DC

## 2016-12-27 NOTE — Progress Notes (Signed)
Cardiology Office Note  Date:  12/27/2016   ID:  MARYLAND SCHWINDT, DOB [birth year], MRN [id number]  PCP:  Viviana Simpler, MD   Chief Complaint  Patient presents with  . other    12 month follow up as well as discuss recent Echo. "doing well." Meds reviewed by the pt. verbally.     HPI:  Mr. Outman is a very pleasant 61 year old gentleman, patient of Dr. Silvio Pate, with moderate aortic valve regurgitation, mild to moderately dilated aortic root 4.1 cm and ascending aorta 4.2 cm, possible bicuspid aortic valve. He presents for routine followup of his aortic valve regurgitation  In follow-up he reports that he feels well, no shortness of breath, no chest pain, no change in his weight. Denies any signs of congestive heart failure  Recent echocardiogram from January 2018 reviewed with him in detail No significant change in size of aortic root, or ascending aorta Interestingly size of left ventricle was 6.4 cm Previously had normal LV size Normal ejection fraction was reported Still with moderate aortic valve regurgitation Apart from the size of the left ventricle, overall no changes since 2012 Echocardiogram images pulled up in the office and shown to him in detail  He does report blood pressure has been running high, typically 150s, sometimes 123456 systolic  EKG on today's visit shows sinus bradycardia with rate 53 bpm, no significant ST or T-wave changes  Other past medical history  echocardiogram in 2014 showed at least moderate aortic valve insufficiency, no dramatic change in his aortic root or ascending aorta. Still mild to moderately dilated, 4.1 cm.(Same as 2013) He has not had an echocardiogram this year but this will be scheduled for today at his request.   PMH:   has a past medical history of Allergy; Asthma; BPH (benign prostatic hypertrophy); Hypertension; Hypertriglyceridemia; and Thyroid disease.  PSH:    Past Surgical History:  Procedure Laterality Date  . CARDIAC  CATHETERIZATION  2006   [place]  . CARDIOVASCULAR STRESS TEST  2006   [place]  . CHOLECYSTECTOMY  2006  . VEIN LIGATION AND STRIPPING  1997    Current Outpatient Prescriptions  Medication Sig Dispense Refill  . albuterol (PROAIR HFA) 108 (90 Base) MCG/ACT inhaler Inhale 2 puffs into the lungs 3 (three) times daily as needed. Asthma flare up 1 Inhaler 2  . amLODipine (NORVASC) 5 MG tablet Take 1 tablet (5 mg total) by mouth daily. 90 tablet 3  . aspirin 81 MG tablet Take 81 mg by mouth daily.    Marland Kitchen levothyroxine (SYNTHROID, LEVOTHROID) 125 MCG tablet Take 1 tablet (125 mcg total) by mouth daily. 90 tablet 3  . Omega-3 Fatty Acids (FISH OIL) 1000 MG CAPS Take by mouth.     No current facility-administered medications for this visit.      Allergies:   Tetracycline hcl   Social History:  The patient  reports that he quit smoking about 38 years ago. He has never used smokeless tobacco. He reports that he does not drink alcohol or use drugs.   Family History:   family history includes Alcohol abuse in his father and mother; Asthma in his mother; Liver cancer in his paternal grandmother; Prostate cancer in his father.    Review of Systems: Review of Systems  Constitutional: Negative.   Respiratory: Negative.   Cardiovascular: Negative.   Gastrointestinal: Negative.   Musculoskeletal: Negative.   Neurological: Negative.   Psychiatric/Behavioral: Negative.   All other systems reviewed and are negative.    PHYSICAL EXAM:  VS:  BP (!) 158/60 (BP Location: Left Arm, Patient Position: Sitting, Cuff Size: Normal)   Pulse (!) 53   Ht 5\' 6"  (1.676 m)   Wt 159 lb 12 oz (72.5 kg)   BMI 25.78 kg/m  , BMI Body mass index is 25.78 kg/m. GEN: Well nourished, well developed, in no acute distress  HEENT: normal  Neck: no JVD, carotid bruits, or masses Cardiac: RRR; 99991111 systolic and diastolic murmur right sternal border,  no rubs, or gallops,no edema  Respiratory:  clear to auscultation  bilaterally, normal work of breathing GI: soft, nontender, nondistended, + BS MS: no deformity or atrophy  Skin: warm and dry, no rash Neuro:  Strength and sensation are intact Psych: euthymic mood, full affect    Recent Labs: 09/28/2016: ALT 16; BUN 14; Creatinine, Ser 0.80; Potassium 4.1; Sodium 140; TSH 1.42    Lipid Panel Lab Results  Component Value Date   CHOL 189 09/28/2016   HDL 33.20 (L) 09/28/2016   LDLCALC 103 (H) 05/26/2012   TRIG (H) 09/28/2016    436.0 Triglyceride is over 400; calculations on Lipids are invalid.      Wt Readings from Last 3 Encounters:  12/27/16 159 lb 12 oz (72.5 kg)  12/21/16 159 lb (72.1 kg)  11/20/16 162 lb (73.5 kg)       ASSESSMENT AND PLAN:  Bradycardia - Plan: EKG 12-Lead Asymptomatic bradycardia, no further workup needed at this time  Bicuspid aortic valve - Plan: EKG 12-Lead Seen on echo,  Will monitor. Discussed with him in detail.   Aortic valve insufficiency, etiology of cardiac valve disease unspecified - Plan: EKG 12-Lead Moderate aortic valve regurgitation Of concern is perhaps dilation of the left ventricle, will need to watch this closely, he denies any symptoms  Ascending aorta dilatation ([place]) - Plan: EKG 12- Echocardiogram images from last month shown to him in detail today No significant change since 2012  Essential hypertension Blood pressure running high on today's office visit and at home Recommended he increase amlodipine to 10 mg daily Suggested he call our office with blood pressure numbers, if it continues to run high, potentially would add HCTZ every other day   Total encounter time more than 25 minutes  Greater than 50% was spent in counseling and coordination of care with the patient   Disposition:   F/U  6 months  Orders Placed This Encounter  Procedures  . EKG 12-Lead     Signed, Esmond Plants, M.D., Ph.D. 12/27/2016  [place], [place]

## 2016-12-27 NOTE — Patient Instructions (Addendum)
Medication Instructions:   Please increase the amlodipine up to 10 mg daily  Labwork:  No new labs needed  Testing/Procedures:  No further testing at this time   I recommend watching educational videos on topics of interest to you at:       [email or web address]  Enter code: [email or web address]    Follow-Up: It was a pleasure seeing you in the office today. Please call us if you have new issues that need to be addressed before your next appt.  [phone]  Your physician wants you to follow-up in: 12 months.  You will receive a reminder letter in the mail two months in advance. If you don't receive a letter, please call our office to schedule the follow-up appointment.  If you need a refill on your cardiac medications before your next appointment, please call your pharmacy.

## 2017-01-08 ENCOUNTER — Encounter: Payer: Self-pay | Admitting: Cardiovascular Disease

## 2017-02-01 ENCOUNTER — Encounter: Payer: Self-pay | Admitting: Family Medicine

## 2017-02-01 ENCOUNTER — Telehealth: Payer: Self-pay | Admitting: Internal Medicine

## 2017-02-01 ENCOUNTER — Ambulatory Visit (INDEPENDENT_AMBULATORY_CARE_PROVIDER_SITE_OTHER): Payer: Self-pay | Admitting: Family Medicine

## 2017-02-01 VITALS — BP 160/52 | HR 64 | Temp 98.0°F | Wt 164.4 lb

## 2017-02-01 DIAGNOSIS — I10 Essential (primary) hypertension: Secondary | ICD-10-CM

## 2017-02-01 DIAGNOSIS — R599 Enlarged lymph nodes, unspecified: Secondary | ICD-10-CM

## 2017-02-01 DIAGNOSIS — R600 Localized edema: Secondary | ICD-10-CM

## 2017-02-01 LAB — CBC WITH DIFFERENTIAL/PLATELET
BASOS PCT: 1 %
Basophils Absolute: 76 cells/uL (ref 0–200)
EOS PCT: 7 %
Eosinophils Absolute: 532 cells/uL — ABNORMAL HIGH (ref 15–500)
HCT: 40.5 % (ref 38.5–50.0)
HEMOGLOBIN: 13.7 g/dL (ref 13.2–17.1)
LYMPHS ABS: 2660 {cells}/uL (ref 850–3900)
Lymphocytes Relative: 35 %
MCH: 31.3 pg (ref 27.0–33.0)
MCHC: 33.8 g/dL (ref 32.0–36.0)
MCV: 92.5 fL (ref 80.0–100.0)
MPV: 9.2 fL (ref 7.5–12.5)
Monocytes Absolute: 684 cells/uL (ref 200–950)
Monocytes Relative: 9 %
NEUTROS ABS: 3648 {cells}/uL (ref 1500–7800)
Neutrophils Relative %: 48 %
Platelets: 285 10*3/uL (ref 140–400)
RBC: 4.38 MIL/uL (ref 4.20–5.80)
RDW: 14.5 % (ref 11.0–15.0)
WBC: 7.6 10*3/uL (ref 3.8–10.8)

## 2017-02-01 LAB — COMPREHENSIVE METABOLIC PANEL
ALBUMIN: 4.2 g/dL (ref 3.6–5.1)
ALT: 16 U/L (ref 9–46)
AST: 17 U/L (ref 10–35)
Alkaline Phosphatase: 76 U/L (ref 40–115)
BILIRUBIN TOTAL: 0.4 mg/dL (ref 0.2–1.2)
BUN: 16 mg/dL (ref 7–25)
CHLORIDE: 102 mmol/L (ref 98–110)
CO2: 30 mmol/L (ref 20–31)
CREATININE: 0.81 mg/dL (ref 0.70–1.25)
Calcium: 9.2 mg/dL (ref 8.6–10.3)
Glucose, Bld: 98 mg/dL (ref 65–99)
Potassium: 4.1 mmol/L (ref 3.5–5.3)
SODIUM: 140 mmol/L (ref 135–146)
TOTAL PROTEIN: 7.1 g/dL (ref 6.1–8.1)

## 2017-02-01 MED ORDER — LISINOPRIL 10 MG PO TABS: 10.0000 mg | ORAL_TABLET | Freq: Every day | ORAL | 1 refills | Status: DC

## 2017-02-01 NOTE — Progress Notes (Signed)
Subjective:    Patient ID: Jeffrey Tucker, male    DOB: 09/11/1956, 61 y.o.   MRN: [id number]  HPI This is a 61 yo male who presents today with leg swelling x 3 days.  Last week had swollen lymph nodes on left side of neck, went away next day and then had swollen nodes on right side of neck. Resolved in a couple of days. No recent cough, runny nose, ear pain or sore throat, no fevers. No swelling in axilla or groin. Takes fexofenadine daily for allergies.  Feet started to swell 3 days ago. Legs feel tight. Last month, his cardiologist Karl Bales) increased his amlodipine from 5 mg to 10 mg. Monitors blood pressure at home, has had decrease in systolic blood pressure about 15 points.   Past Medical History:  Diagnosis Date  . Allergy    allergic rhinitis  . Asthma    mostly in childhood  . BPH (benign prostatic hypertrophy)   . Hypertension   . Hypertriglyceridemia   . Thyroid disease    hypothyroidism   Past Surgical History:  Procedure Laterality Date  . CARDIAC CATHETERIZATION  2006   [place]  . CARDIOVASCULAR STRESS TEST  2006   [place]  . CHOLECYSTECTOMY  2006  . VEIN LIGATION AND STRIPPING  1997   Family History  Problem Relation Age of Onset  . Alcohol abuse Father   . Prostate cancer Father   . Alcohol abuse Mother   . Asthma Mother   . Liver cancer Paternal Grandmother   . Colon cancer Neg Hx   . Coronary artery disease Neg Hx   . Hypertension Neg Hx   . Diabetes Neg Hx    Social History  Substance Use Topics  . Smoking status: Former Smoker    Quit date: 11/26/1978  . Smokeless tobacco: Never Used     Comment: social smoked till 1980's  . Alcohol use No     Comment: none since 1984      Review of Systems  Constitutional: Negative for chills, fatigue and fever.  HENT: Negative for ear pain, rhinorrhea and sore throat.   Respiratory: Negative for cough and shortness of breath.   Cardiovascular: Positive for leg swelling. Negative for chest pain and  palpitations.  Musculoskeletal: Negative for arthralgias and myalgias.  Hematological: Positive for adenopathy (neck, resolved now).       Objective:   Physical Exam  Constitutional: He is oriented to person, place, and time. He appears well-developed and well-nourished. No distress.  HENT:  Head: Normocephalic and atraumatic.  Right Ear: External ear normal.  Left Ear: External ear normal.  Nose: Nose normal.  Mouth/Throat: Oropharynx is clear and moist. No oropharyngeal exudate.  Eyes: Conjunctivae are normal.  Neck: Normal range of motion. Neck supple.  Cardiovascular: Normal rate and regular rhythm.   Murmur (3/6 systolic) heard. Pulses:      Dorsalis pedis pulses are 3+ on the right side, and 3+ on the left side.       Posterior tibial pulses are 3+ on the right side, and 3+ on the left side.  Pulmonary/Chest: Effort normal and breath sounds normal.  Musculoskeletal: Edema: 1+ lower extremity edema.  Lymphadenopathy:    He has no cervical adenopathy.  Neurological: He is alert and oriented to person, place, and time.  Skin: Skin is warm and dry. He is not diaphoretic.  Psychiatric: He has a normal mood and affect. His behavior is normal. Judgment and thought content normal.  Vitals  reviewed.     BP (!) 160/52 (BP Location: Right Arm, Patient Position: Sitting, Cuff Size: Normal)   Pulse 64   Temp 98 F (36.7 C) (Oral)   Wt 164 lb 6.4 oz (74.6 kg)   SpO2 97%   BMI 26.53 kg/m  Wt Readings from Last 3 Encounters:  02/01/17 164 lb 6.4 oz (74.6 kg)  12/27/16 159 lb 12 oz (72.5 kg)  12/21/16 159 lb (72.1 kg)   BP Readings from Last 3 Encounters:  02/01/17 (!) 160/52  12/27/16 (!) 158/60  12/21/16 (!) 148/52   CMET 11/17- WNL    Assessment & Plan:  1. Lower leg edema - suspect amlodipine, has recently increased dose - DC amlodipine, start lisinopril - Comprehensive metabolic panel  2. Swollen lymph nodes - resolved, will check CBC  - CBC with  Differential/Platelet  3. Essential hypertension - lisinopril (PRINIVIL,ZESTRIL) 10 MG tablet; Take 1 tablet (10 mg total) by mouth daily.  Dispense: 30 tablet; Refill: 1 - Comprehensive metabolic panel  - follow up with PCP 2 weeks, bring BP log Clarene Reamer, FNP-BC  [facility]  02/01/2017 9:20 PM

## 2017-02-01 NOTE — Telephone Encounter (Signed)
[facility]  Patient Name: Jeffrey Tucker  DOB: 12/20/1965    Initial Comment Caller states, he is having gland swelling and went back to normal - he has welling in feet and lower portion in legs is swelling. Verified    Nurse Assessment  Nurse: Harlow Mares, RN, Suanne Marker Date/Time Eilene Ghazi Time): 02/01/2017 2:28:34 PM  Confirm and document reason for call. If symptomatic, describe symptoms. ---Caller states, he is having gland swelling in his neck and went back to normal - he has swelling in feet and lower portion in legs is swelling. Reports Amilodipine from 5mg  to 10mg  about 1 mo ago, by the Cardiologist.  Does the patient have any new or worsening symptoms? ---Yes  Will a triage be completed? ---Yes  Related visit to physician within the last 2 weeks? ---No  Does the PT have any chronic conditions? (i.e. diabetes, asthma, etc.) ---Yes  List chronic conditions. ---hypothyroidism; hx of heart murmur (sees cardiologist); HTN  Is this a behavioral health or substance abuse call? ---No     Guidelines    Guideline Title Affirmed Question Affirmed Notes  Leg Swelling and Edema [1] Red area or streak [2] large (> 2 in. or 5 cm)    Final Disposition User   See Physician within 4 Hours (or PCP triage) Harlow Mares, RN, [place]    Comments  Scheduled caller for appt with Clarene Reamer, FNP at the [place] location for today at 4pm, address given to caller and caller voiced understanding.   Referrals  REFERRED TO PCP OFFICE   Disagree/Comply: Comply

## 2017-02-01 NOTE — Telephone Encounter (Signed)
Pt has appt to see Glenda Chroman FNP [facility] at [facility].

## 2017-02-01 NOTE — Patient Instructions (Signed)
Please stop your amlodipine- I think it is responsible for your leg swelling I have sent in a new blood pressure medicine for you to start tomorrow- please keep a log of your blood pressure readings Follow up in 2 weeks

## 2017-02-01 NOTE — Progress Notes (Signed)
Pre visit review using our clinic review tool, if applicable. No additional management support is needed unless otherwise documented below in the visit note. 

## 2017-02-02 ENCOUNTER — Encounter: Payer: Self-pay | Admitting: Family Medicine

## 2017-02-10 ENCOUNTER — Encounter: Payer: Self-pay | Admitting: Cardiovascular Disease

## 2017-02-28 ENCOUNTER — Encounter: Payer: Self-pay | Admitting: Internal Medicine

## 2017-02-28 ENCOUNTER — Ambulatory Visit (INDEPENDENT_AMBULATORY_CARE_PROVIDER_SITE_OTHER): Payer: Self-pay | Admitting: Internal Medicine

## 2017-02-28 VITALS — BP 128/60 | HR 62 | Temp 97.6°F | Wt 162.0 lb

## 2017-02-28 DIAGNOSIS — I10 Essential (primary) hypertension: Secondary | ICD-10-CM

## 2017-02-28 DIAGNOSIS — R1032 Left lower quadrant pain: Secondary | ICD-10-CM

## 2017-02-28 LAB — RENAL FUNCTION PANEL
ALBUMIN: 4.2 g/dL (ref 3.5–5.2)
BUN: 14 mg/dL (ref 6–23)
CO2: 28 mEq/L (ref 19–32)
CREATININE: 0.86 mg/dL (ref 0.40–1.50)
Calcium: 9 mg/dL (ref 8.4–10.5)
Chloride: 105 mEq/L (ref 96–112)
GFR: 96.03 mL/min (ref >60.00)
Glucose, Bld: 171 mg/dL — ABNORMAL HIGH (ref 70–99)
PHOSPHORUS: 3.1 mg/dL (ref 2.3–4.6)
Potassium: 3.6 mEq/L (ref 3.5–5.1)
SODIUM: 140 meq/L (ref 135–145)

## 2017-02-28 MED ORDER — AMLODIPINE BESYLATE 5 MG PO TABS: 5.0000 mg | ORAL_TABLET | Freq: Every day | ORAL | 3 refills | Status: DC

## 2017-02-28 MED ORDER — ALBUTEROL SULFATE HFA 108 (90 BASE) MCG/ACT IN AERS: 2.0000 | INHALATION_SPRAY | Freq: Three times a day (TID) | RESPIRATORY_TRACT | 2 refills | Status: DC | PRN

## 2017-02-28 NOTE — Patient Instructions (Signed)
Let me know if you are over 140/90 at home on a consistent basis

## 2017-02-28 NOTE — Progress Notes (Signed)
Pre visit review using our clinic review tool, if applicable. No additional management support is needed unless otherwise documented below in the visit note. 

## 2017-02-28 NOTE — Assessment & Plan Note (Signed)
BP Readings from Last 3 Encounters:  02/28/17 128/60  02/01/17 (!) 160/52  12/27/16 (!) 158/60   Repeat by me on right 154/40 He has had continued elevated BP at home Reviewed that amlodipine was known side effect, especially at higher doses, and he should be able to go back at the lower dose Will restart the amlodipine 5 Check renal function

## 2017-02-28 NOTE — Assessment & Plan Note (Signed)
Vague but has persisted Would send back to GI if this continues

## 2017-02-28 NOTE — Progress Notes (Signed)
Subjective:    Patient ID: Jeffrey Tucker, male    DOB: 01-05-1956, 61 y.o.   MRN: [id number]  HPI Here for follow up of his BP after medication change  He noted edema--that seemed to be better after stopping this Had been concerned about his BP so Dr Rockey Situ increased his amlodipine from 5-10mg  daily It did help the BP but noticed tenderness in throat (anterior neck) and left side felt swollen. Then the right side of neck felt swollen Soon after he got edema in feet  Amlodipine stopped and changed to lisinopril (3/14) Edema is better Neck seems to be back to normal He thinks his weight is still up 3-4 # from his usual BP at home (he checks daily)--- 154/55, 148/64, 152/56, 138/64 Same machine over the past 6 years Donated blood yesterday-- 148/55  Current Outpatient Prescriptions on File Prior to Visit  Medication Sig Dispense Refill  . albuterol (PROAIR HFA) 108 (90 Base) MCG/ACT inhaler Inhale 2 puffs into the lungs 3 (three) times daily as needed. Asthma flare up 1 Inhaler 2  . aspirin 81 MG tablet Take 81 mg by mouth daily.    Marland Kitchen levothyroxine (SYNTHROID, LEVOTHROID) 125 MCG tablet Take 1 tablet (125 mcg total) by mouth daily. 90 tablet 3  . lisinopril (PRINIVIL,ZESTRIL) 10 MG tablet Take 1 tablet (10 mg total) by mouth daily. 30 tablet 1  . Omega-3 Fatty Acids (FISH OIL) 1000 MG CAPS Take by mouth.     No current facility-administered medications on file prior to visit.     Allergies  Allergen Reactions  . Amlodipine Swelling    Lower extremity swelling  . Tetracycline Hcl     REACTION: joint pain    Past Medical History:  Diagnosis Date  . Allergy    allergic rhinitis  . Asthma    mostly in childhood  . BPH (benign prostatic hypertrophy)   . Hypertension   . Hypertriglyceridemia   . Thyroid disease    hypothyroidism    Past Surgical History:  Procedure Laterality Date  . CARDIAC CATHETERIZATION  2006   [place]  . CARDIOVASCULAR STRESS TEST  2006   [place]  . CHOLECYSTECTOMY  2006  . VEIN LIGATION AND STRIPPING  1997    Family History  Problem Relation Age of Onset  . Alcohol abuse Father   . Prostate cancer Father   . Alcohol abuse Mother   . Asthma Mother   . Liver cancer Paternal Grandmother   . Colon cancer Neg Hx   . Coronary artery disease Neg Hx   . Hypertension Neg Hx   . Diabetes Neg Hx     Social History   Social History  . Marital status: Married    Spouse name: Aulden Calise  . Number of children: 2  . Years of education: N/A   Occupational History  . 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 Commercial real estate    Social History Main Topics  . Smoking status: Former Smoker    Quit date: 11/26/1978  . Smokeless tobacco: Never Used     Comment: social smoked till 1980's  . Alcohol use No     Comment: none since 1984  . Drug use: No  . Sexual activity: Not on file   Other Topics Concern  . Not on file   Social History Narrative  . No narrative on file   Review of Systems  Has restarted  his walking program (had stopped with the asthma issues) Will get some pain in upper chest at start of walking--then it goes away (sounds like exercise asthma--discussed using albuterol before walking) Still with vague minor abdominal pain. Appetite and bowels still fine Notes that he has to clear his throat a lot. No heartburn or dysphagia     Objective:   Physical Exam  Cardiovascular: Normal rate, regular rhythm and normal heart sounds.  Exam reveals no gallop.   Systolic and diastolic murmurs present  Pulmonary/Chest: Effort normal and breath sounds normal. No respiratory distress. He has no wheezes. He has no rales.  Musculoskeletal: He exhibits no edema.          Assessment & Plan:

## 2017-03-13 ENCOUNTER — Telehealth: Payer: Self-pay

## 2017-03-13 DIAGNOSIS — R1032 Left lower quadrant pain: Secondary | ICD-10-CM

## 2017-03-13 NOTE — Telephone Encounter (Signed)
Patient left a voice mail said during his last visit with you, you and him talked about getting an "MRI" of his abd.  He said yesterday he was in "excruciating" abd pain and he would like to get an MRI done to see what's causing the pain. Thanks.

## 2017-03-13 NOTE — Telephone Encounter (Signed)
Please let him know it is a CT that we will check--and send him to GI if this doesn't give [place] any answers and the pain continues

## 2017-03-13 NOTE — Telephone Encounter (Signed)
Patient is scheduled at [facility] because he is a self pay patient.He is scheduled for tomorrow 03/14/17 at 1pm. He will need a GI Referral put in if you want him to see GI after the CT scan.

## 2017-03-14 ENCOUNTER — Encounter: Payer: Self-pay | Admitting: Internal Medicine

## 2017-03-14 ENCOUNTER — Other Ambulatory Visit: Payer: Self-pay | Admitting: Internal Medicine

## 2017-03-14 DIAGNOSIS — N289 Disorder of kidney and ureter, unspecified: Secondary | ICD-10-CM

## 2017-03-14 NOTE — Telephone Encounter (Signed)
Thanks Only making the referral if ongoing issues after the scan. He may be reassured if normal, and some of this may be anxiety related

## 2017-04-11 ENCOUNTER — Encounter: Payer: Self-pay | Admitting: Urology

## 2017-04-11 ENCOUNTER — Ambulatory Visit (INDEPENDENT_AMBULATORY_CARE_PROVIDER_SITE_OTHER): Payer: Self-pay | Admitting: Urology

## 2017-04-11 VITALS — BP 126/44 | HR 54 | Ht 66.0 in | Wt 164.0 lb

## 2017-04-11 DIAGNOSIS — N2889 Other specified disorders of kidney and ureter: Secondary | ICD-10-CM

## 2017-04-11 DIAGNOSIS — N281 Cyst of kidney, acquired: Secondary | ICD-10-CM

## 2017-04-11 DIAGNOSIS — Z125 Encounter for screening for malignant neoplasm of prostate: Secondary | ICD-10-CM

## 2017-04-11 DIAGNOSIS — N200 Calculus of kidney: Secondary | ICD-10-CM

## 2017-04-11 DIAGNOSIS — N400 Enlarged prostate without lower urinary tract symptoms: Secondary | ICD-10-CM

## 2017-04-11 LAB — URINALYSIS, COMPLETE
Appearance Ur: NEGATIVE
BILIRUBIN UA: NEGATIVE
COLOR UA: NEGATIVE
GLUCOSE, UA: NEGATIVE
KETONES UA: NEGATIVE
Leukocytes, UA: NEGATIVE
NITRITE UA: NEGATIVE
PROTEIN UA: NEGATIVE
UUROB: 0.2 mg/dL (ref 0.2–1.0)
pH, UA: 5 (ref 5.0–7.5)

## 2017-04-11 LAB — MICROSCOPIC EXAMINATION
Bacteria, UA: NONE SEEN
EPITHELIAL CELLS (NON RENAL): NONE SEEN /HPF (ref 0–10)
WBC, UA: NONE SEEN /hpf (ref 0–?)

## 2017-04-11 NOTE — Progress Notes (Signed)
04/11/2017 3:30 PM   Jeffrey Tucker 11/22/1956 [id number]  Referring provider: Venia Carbon, MD [address] [name], [name] [address]  Chief Complaint  Patient presents with  . New Patient (Initial Visit)    Uretheral disorder    HPI: The patient is a 61 year old gentleman who presents today for evaluation of proximal left ureteral enhancement consistent with regional infection on a recent CT scan. This is per CT report no [place] [facility] which I do not have access to the images. He also had bilateral cysts (incompletely characterized) and left nephrolithiasis of unknown size.  The CT was performed for left lower quadrant pain that had occurred for 3-4 months. He noted his intermittent pain in his left lower quadrant that was sharp. He will require pain medication once or twice per month 4. He has never had this before. He denies hematuria. He denies known history of nephrolithiasis. He denies left flank pain.  The patient does note nocturia 1-3. Does strain. He does have some hesitancy and weak stream. He is not interested in medications at this time.  The patient last had a PSA in November 2017 which was 0.46.   PMH: Past Medical History:  Diagnosis Date  . Allergy    allergic rhinitis  . Asthma    mostly in childhood  . BPH (benign prostatic hypertrophy)   . Hypertension   . Hypertriglyceridemia   . Thyroid disease    hypothyroidism    Surgical History: Past Surgical History:  Procedure Laterality Date  . CARDIAC CATHETERIZATION  2006   [place]  . CARDIOVASCULAR STRESS TEST  2006   [place]  . CHOLECYSTECTOMY  2006  . VEIN LIGATION AND STRIPPING  1997    Home Medications:  Allergies as of 04/11/2017      Reactions   Tetracycline Hcl    REACTION: joint pain   Amlodipine Swelling   Lower extremity swelling--tolerated 5mg  dose      Medication List       Accurate as of 04/11/17  3:30 PM. Always use your most recent med list.            albuterol 108 (90 Base) MCG/ACT inhaler Commonly known as:  PROAIR HFA Inhale 2 puffs into the lungs 3 (three) times daily as needed. Asthma flare up   amLODipine 5 MG tablet Commonly known as:  NORVASC Take 1 tablet (5 mg total) by mouth daily.   aspirin 81 MG tablet Take 81 mg by mouth daily.   Fish Oil 1000 MG Caps Take by mouth.   levothyroxine 125 MCG tablet Commonly known as:  SYNTHROID, LEVOTHROID Take 1 tablet (125 mcg total) by mouth daily.   lisinopril 10 MG tablet Commonly known as:  PRINIVIL,ZESTRIL Take 1 tablet (10 mg total) by mouth daily.       Allergies:  Allergies  Allergen Reactions  . Tetracycline Hcl     REACTION: joint pain  . Amlodipine Swelling    Lower extremity swelling--tolerated 5mg  dose    Family History: Family History  Problem Relation Age of Onset  . Alcohol abuse Father   . Prostate cancer Father   . Alcohol abuse Mother   . Asthma Mother   . Liver cancer Paternal Grandmother   . Colon cancer Neg Hx   . Coronary artery disease Neg Hx   . Hypertension Neg Hx   . Diabetes Neg Hx   . Bladder Cancer Neg Hx     Social History:  reports  that he quit smoking about 38 years ago. He has never used smokeless tobacco. He reports that he does not drink alcohol or use drugs.  ROS: UROLOGY Frequent Urination?: Yes Hard to postpone urination?: Yes Burning/pain with urination?: No Get up at night to urinate?: Yes Leakage of urine?: Yes Urine stream starts and stops?: Yes Trouble starting stream?: Yes Do you have to strain to urinate?: No Blood in urine?: No Urinary tract infection?: No Sexually transmitted disease?: No Injury to kidneys or bladder?: No Painful intercourse?: No Weak stream?: Yes Erection problems?: No Penile pain?: No  Gastrointestinal Nausea?: No Vomiting?: No Indigestion/heartburn?: No Diarrhea?: Yes Constipation?: No  Constitutional Fever: No Night sweats?: No Weight loss?: No Fatigue?:  No  Skin Skin rash/lesions?: No Itching?: No  Eyes Blurred vision?: No Double vision?: No  Ears/Nose/Throat Sore throat?: No Sinus problems?: No  Hematologic/Lymphatic Swollen glands?: No Easy bruising?: No  Cardiovascular Leg swelling?: Yes Chest pain?: No  Respiratory Cough?: Yes Shortness of breath?: No  Endocrine Excessive thirst?: No  Musculoskeletal Back pain?: No Joint pain?: No  Neurological Headaches?: No Dizziness?: No  Psychologic Depression?: No Anxiety?: No  Physical Exam: BP (!) 126/44 (BP Location: Left Arm, Patient Position: Sitting, Cuff Size: Normal) Comment (BP Location): left arm  Pulse (!) 54   Ht 5\' 6"  (1.676 m)   Wt 164 lb (74.4 kg)   BMI 26.47 kg/m   Constitutional:  Alert and oriented, No acute distress. HEENT: [place] AT, moist mucus membranes.  Trachea midline, no masses. Cardiovascular: No clubbing, cyanosis, or edema. Respiratory: Normal respiratory effort, no increased work of breathing. GI: Abdomen is soft, nontender, nondistended, no abdominal masses GU: No CVA tenderness. Normal phallus. Testicles are bilaterally benign. DRE: 2+ benign. Skin: No rashes, bruises or suspicious lesions. Lymph: No cervical or inguinal adenopathy. Neurologic: Grossly intact, no focal deficits, moving all 4 extremities. Psychiatric: Normal mood and affect.  Laboratory Data: Lab Results  Component Value Date   WBC 7.6 02/01/2017   HGB 13.7 02/01/2017   HCT 40.5 02/01/2017   MCV 92.5 02/01/2017   PLT 285 02/01/2017    Lab Results  Component Value Date   CREATININE 0.86 02/28/2017    Lab Results  Component Value Date   PSA 0.46 09/28/2016   PSA 0.56 09/20/2015   PSA 0.33 09/09/2013    Lab Results  Component Value Date   TESTOSTERONE 295 (L) 09/20/2015    No results found for: HGBA1C  Urinalysis No results found for: COLORURINE, APPEARANCEUR, LABSPEC, [place], GLUCOSEU, HGBUR, BILIRUBINUR, KETONESUR, PROTEINUR, UROBILINOGEN,  NITRITE, LEUKOCYTESUR  Pertinent Imaging: CT report from [place] reviewed. Images were not available for review.  Assessment & Plan:   1. Left ureteritis Per radiology report, this is likely residual inflammation though malignancy is not ruled out. I do not think this is the source of his left lower quadrant pain. Regardless, I will like to see resolution of this. We will obtain a triphasic CT to further evaluate in 1 month to allow more time for her to recover.  2. Renal cyst Incompletely characterized. Above CT will better characterize.  3. Left nephrolithiasis Unknown size. Will better evaluate on CT  4. BPH The patient is not bothered by mouth by symptoms to want to try medication.  5. Prostate cancer screening Up-to-date.  Return in about 4 weeks (around 05/09/2017) for at CT.  Nickie Retort, MD  [facility] [address], [place], [place] [address] [phone]

## 2017-04-17 ENCOUNTER — Other Ambulatory Visit: Payer: Self-pay

## 2017-04-17 DIAGNOSIS — I10 Essential (primary) hypertension: Secondary | ICD-10-CM

## 2017-04-17 MED ORDER — LISINOPRIL 10 MG PO TABS: 10.0000 mg | ORAL_TABLET | Freq: Every day | ORAL | 0 refills | Status: DC

## 2017-04-17 NOTE — Telephone Encounter (Signed)
Rx sent electronically.  

## 2017-05-02 ENCOUNTER — Ambulatory Visit
Admission: RE | Admit: 2017-05-02 | Discharge: 2017-05-02 | Disposition: A | Discharge status: Home / Self Care | Payer: Self-pay | Source: Ambulatory Visit | Attending: Urology | Admitting: Urology

## 2017-05-02 DIAGNOSIS — N281 Cyst of kidney, acquired: Secondary | ICD-10-CM | POA: Insufficient documentation

## 2017-05-02 DIAGNOSIS — I700 Atherosclerosis of aorta: Secondary | ICD-10-CM | POA: Insufficient documentation

## 2017-05-02 DIAGNOSIS — N200 Calculus of kidney: Secondary | ICD-10-CM | POA: Insufficient documentation

## 2017-05-02 DIAGNOSIS — R1904 Left lower quadrant abdominal swelling, mass and lump: Secondary | ICD-10-CM | POA: Insufficient documentation

## 2017-05-02 DIAGNOSIS — N2889 Other specified disorders of kidney and ureter: Secondary | ICD-10-CM | POA: Insufficient documentation

## 2017-05-02 LAB — POCT I-STAT CREATININE: CREATININE: 0.9 mg/dL (ref 0.61–1.24)

## 2017-05-02 MED ORDER — IOPAMIDOL (ISOVUE-300) INJECTION 61%
125.0000 mL | Freq: Once | INTRAVENOUS | Status: AC | PRN
  Administered 2017-05-02: 125 mL via INTRAVENOUS

## 2017-05-09 ENCOUNTER — Encounter: Payer: Self-pay | Admitting: Urology

## 2017-05-09 ENCOUNTER — Ambulatory Visit (INDEPENDENT_AMBULATORY_CARE_PROVIDER_SITE_OTHER): Payer: Self-pay | Admitting: Urology

## 2017-05-09 VITALS — BP 154/61 | HR 56 | Ht 66.0 in | Wt 162.1 lb

## 2017-05-09 DIAGNOSIS — N289 Disorder of kidney and ureter, unspecified: Secondary | ICD-10-CM

## 2017-05-09 DIAGNOSIS — N200 Calculus of kidney: Secondary | ICD-10-CM

## 2017-05-09 DIAGNOSIS — N281 Cyst of kidney, acquired: Secondary | ICD-10-CM

## 2017-05-09 NOTE — Progress Notes (Signed)
05/09/2017 4:03 PM   Jeffrey Tucker 1956/02/23 [id number]  Referring provider: Venia Carbon, MD [address] [place], [place] [address]  Chief Complaint  Patient presents with  . Results    CT     HPI: The patient is a 61 year old gentleman who presents today for evaluation of proximal left ureteral enhancement consistent with regional infection on a recent CT scan. This is per CT report from [facility] which I do not have access to the images. Repeat CT urogram at this time shows persistence of this area with concern for possible TCC of his left upper tract. He does have 2 nonobstructive left renal calculi as well with the largest being approximately 4 mm. CT shows benign renal cysts as well  The CT was performed for left lower quadrant pain that had occurred for 3-4 months. He noted his intermittent pain in his left lower quadrant that was sharp. He will require pain medication once or twice per month 4. He has never had this before. He denies hematuria. He denies known history of nephrolithiasis. He denies left flank pain.  Repeat CT shows no explanation for his symptoms.  The patient does note nocturia 1-3. Does strain. He does have some hesitancy and weak stream. He is not interested in medications at this time.  The patient last had a PSA in November 2017 which was 0.46.     PMH: Past Medical History:  Diagnosis Date  . Allergy    allergic rhinitis  . Asthma    mostly in childhood  . BPH (benign prostatic hypertrophy)   . Hypertension   . Hypertriglyceridemia   . Thyroid disease    hypothyroidism    Surgical History: Past Surgical History:  Procedure Laterality Date  . CARDIAC CATHETERIZATION  2006   [place]  . CARDIOVASCULAR STRESS TEST  2006   [place]  . CHOLECYSTECTOMY  2006  . VEIN LIGATION AND STRIPPING  1997    Home Medications:  Allergies as of 05/09/2017      Reactions   Tetracycline Hcl    REACTION: joint pain   Amlodipine Swelling   Lower extremity swelling--tolerated 5mg  dose      Medication List       Accurate as of 05/09/17  4:03 PM. Always use your most recent med list.          albuterol 108 (90 Base) MCG/ACT inhaler Commonly known as:  PROAIR HFA Inhale 2 puffs into the lungs 3 (three) times daily as needed. Asthma flare up   amLODipine 5 MG tablet Commonly known as:  NORVASC Take 1 tablet (5 mg total) by mouth daily.   aspirin 81 MG tablet Take 81 mg by mouth daily.   Fish Oil 1000 MG Caps Take by mouth.   levothyroxine 125 MCG tablet Commonly known as:  SYNTHROID, LEVOTHROID Take 1 tablet (125 mcg total) by mouth daily.   lisinopril 10 MG tablet Commonly known as:  PRINIVIL,ZESTRIL Take 1 tablet (10 mg total) by mouth daily.       Allergies:  Allergies  Allergen Reactions  . Tetracycline Hcl     REACTION: joint pain  . Amlodipine Swelling    Lower extremity swelling--tolerated 5mg  dose    Family History: Family History  Problem Relation Age of Onset  . Alcohol abuse Father   . Prostate cancer Father   . Alcohol abuse Mother   . Asthma Mother   . Liver cancer Paternal Grandmother   . Colon  cancer Neg Hx   . Coronary artery disease Neg Hx   . Hypertension Neg Hx   . Diabetes Neg Hx   . Bladder Cancer Neg Hx   . Kidney cancer Neg Hx     Social History:  reports that he quit smoking about 38 years ago. He has never used smokeless tobacco. He reports that he does not drink alcohol or use drugs.  ROS: UROLOGY Frequent Urination?: No Hard to postpone urination?: No Burning/pain with urination?: No Get up at night to urinate?: No Leakage of urine?: No Urine stream starts and stops?: No Trouble starting stream?: No Do you have to strain to urinate?: No Blood in urine?: No Urinary tract infection?: No Sexually transmitted disease?: No Injury to kidneys or bladder?: No Painful intercourse?: No Weak stream?: No Erection problems?: No Penile pain?:  No  Gastrointestinal Nausea?: No Vomiting?: No Indigestion/heartburn?: No Diarrhea?: No Constipation?: No  Constitutional Fever: No Night sweats?: No Weight loss?: No Fatigue?: No  Skin Skin rash/lesions?: No Itching?: No  Eyes Blurred vision?: No Double vision?: No  Ears/Nose/Throat Sore throat?: No Sinus problems?: No  Hematologic/Lymphatic Swollen glands?: No Easy bruising?: No  Cardiovascular Leg swelling?: No Chest pain?: No  Respiratory Cough?: No Shortness of breath?: No  Endocrine Excessive thirst?: No  Musculoskeletal Back pain?: No Joint pain?: No  Neurological Headaches?: No Dizziness?: No  Psychologic Depression?: No Anxiety?: No  Physical Exam: BP (!) 154/61   Pulse (!) 56   Ht 5\' 6"  (1.676 m)   Wt 162 lb 1.6 oz (73.5 kg)   BMI 26.16 kg/m   Constitutional:  Alert and oriented, No acute distress. HEENT: [place] AT, moist mucus membranes.  Trachea midline, no masses. Cardiovascular: No clubbing, cyanosis, or edema. Respiratory: Normal respiratory effort, no increased work of breathing. GI: Abdomen is soft, nontender, nondistended, no abdominal masses GU: No CVA tenderness.  Skin: No rashes, bruises or suspicious lesions. Lymph: No cervical or inguinal adenopathy. Neurologic: Grossly intact, no focal deficits, moving all 4 extremities. Psychiatric: Normal mood and affect.  Laboratory Data: Lab Results  Component Value Date   WBC 7.6 02/01/2017   HGB 13.7 02/01/2017   HCT 40.5 02/01/2017   MCV 92.5 02/01/2017   PLT 285 02/01/2017    Lab Results  Component Value Date   CREATININE 0.90 05/02/2017    Lab Results  Component Value Date   PSA 0.46 09/28/2016   PSA 0.56 09/20/2015   PSA 0.33 09/09/2013    Lab Results  Component Value Date   TESTOSTERONE 295 (L) 09/20/2015    No results found for: HGBA1C  Urinalysis    Component Value Date/Time   APPEARANCEUR Negative 04/11/2017 1452   GLUCOSEU Negative 04/11/2017  1452   BILIRUBINUR Negative 04/11/2017 1452   PROTEINUR Negative 04/11/2017 1452   NITRITE Negative 04/11/2017 1452   LEUKOCYTESUR Negative 04/11/2017 1452    Pertinent Imaging: CT urogram reviewed as above.  Assessment & Plan:   1. Left renal pelvis lesion I discussed the patient that he is a filling defect in the left renal pelvis/proximal ureter that could potentially represent a malignancy. We discussed at this point the best option would be to undergo cystoscopy, left ureteroscopy, possible tumor biopsy, possible laser ablation of tumor, left ureteral stent placement. We did discuss that this could be a TCC of the bladder and depending on the grade this may be enough to treat the disease though if he has high-grade disease hernia removal of his left kidney and ureter.  We discussed the risks, benefits, and indications of the procedure. He understands the risks include but are not limited to bleeding, infection, iatrogenic injury, need for repeat procedures, need for ureteral stent placement. All questions were answered. He is agreeable to proceeding.  2. Renal cyst Benign on CT Urogram  3. Left nephrolithiasis May attempt removal of the time of his left ureteroscopy if feasible  4. BPH The patient is not bothered by symptoms  5. Prostate cancer screening Up-to-date.  Nickie Retort, MD  [facility] [address] [place], [place] [address] [phone]

## 2017-05-10 ENCOUNTER — Telehealth: Payer: Self-pay | Admitting: Radiology

## 2017-05-10 ENCOUNTER — Telehealth: Payer: Self-pay | Admitting: Cardiovascular Disease

## 2017-05-10 ENCOUNTER — Other Ambulatory Visit: Payer: Self-pay | Admitting: Radiology

## 2017-05-10 DIAGNOSIS — N289 Disorder of kidney and ureter, unspecified: Secondary | ICD-10-CM

## 2017-05-10 NOTE — Telephone Encounter (Signed)
Notified pt of renal pelvis biopsy with Dr Pilar Jarvis scheduled 05/24/17, pre-admit testing appt on 05/14/17 @8 :00 & to call day prior to surgery for arrival time to SDS. Pt states ASA 81mg  was prescribed years ago by Dr Silvio Pate as general heart attack prevention so advised pt to hold medication x 7 days prior to surgery. Questions were answered to pt's satisfaction, no further questions at this time. Pt voices understanding.

## 2017-05-10 NOTE — Telephone Encounter (Signed)
Received cardiac clearance request for pt to proceed w/ cystoscopy, left ureteroscopy, left renal pelvis biopsy w/ possible laser ablation of tumor, left ureteral stent placement on 05/24/17 w/ Dr. Baruch Gouty,   He would like permission to hold asa 81 mg x 7 days prior to procedure. Please route clearance to [place] @ [phone].

## 2017-05-13 ENCOUNTER — Telehealth: Payer: Self-pay | Admitting: Radiology

## 2017-05-13 ENCOUNTER — Encounter: Payer: Self-pay | Admitting: Radiology

## 2017-05-13 NOTE — Telephone Encounter (Signed)
Acceptable risk for procedure No further testing needed Okay to hold aspirin

## 2017-05-13 NOTE — Telephone Encounter (Signed)
error 

## 2017-05-13 NOTE — Telephone Encounter (Signed)
Faxed to fax# provided.

## 2017-05-14 ENCOUNTER — Encounter
Admission: RE | Admit: 2017-05-14 | Discharge: 2017-05-14 | Disposition: A | Discharge status: Home / Self Care | Payer: Self-pay | Source: Ambulatory Visit | Attending: Urology | Admitting: Urology

## 2017-05-14 DIAGNOSIS — Z01812 Encounter for preprocedural laboratory examination: Secondary | ICD-10-CM | POA: Insufficient documentation

## 2017-05-14 DIAGNOSIS — N400 Enlarged prostate without lower urinary tract symptoms: Secondary | ICD-10-CM | POA: Insufficient documentation

## 2017-05-14 DIAGNOSIS — N200 Calculus of kidney: Secondary | ICD-10-CM | POA: Insufficient documentation

## 2017-05-14 DIAGNOSIS — N281 Cyst of kidney, acquired: Secondary | ICD-10-CM | POA: Insufficient documentation

## 2017-05-14 DIAGNOSIS — N289 Disorder of kidney and ureter, unspecified: Secondary | ICD-10-CM | POA: Insufficient documentation

## 2017-05-14 HISTORY — DX: Personal history of urinary calculi: Z87.442

## 2017-05-14 HISTORY — DX: Hypothyroidism, unspecified: E03.9

## 2017-05-14 HISTORY — DX: Cardiac murmur, unspecified: R01.1

## 2017-05-14 NOTE — Patient Instructions (Signed)
Your procedure is scheduled on: May 24, 2017 (FRIDAY) Report to Same Day Surgery 2nd floor medical mall ([place] Entrance-take elevator on left to 2nd floor.  Check in with surgery information desk.) To find out your arrival time please call [phone] between 1PM - 3PM on May 23, 2017 (THURSDAY)  Remember: Instructions that are not followed completely may result in serious medical risk, up to and including death, or upon the discretion of your surgeon and anesthesiologist your surgery may need to be rescheduled.    _x___ 1. Do not eat food or drink liquids after midnight. No gum chewing or hard candies                                __x__ 2. No Alcohol for 24 hours before or after surgery.   __x__3. No Smoking for 24 prior to surgery.   ____  4. Bring all medications with you on the day of surgery if instructed.    __x__ 5. Notify your doctor if there is any change in your medical condition     (cold, fever, infections).     Do not wear jewelry, make-up, hairpins, clips or nail polish.  Do not wear lotions, powders, or perfumes. You may wear deodorant.  Do not shave 48 hours prior to surgery. Men may shave face and neck.  Do not bring valuables to the hospital.    [facility] is not responsible for any belongings or valuables.               Contacts, dentures or bridgework may not be worn into surgery.  Leave your suitcase in the car. After surgery it may be brought to your room.  For patients admitted to the hospital, discharge time is determined by your treatment team                        Patients discharged the day of surgery will not be allowed to drive home.  You will need someone to drive you home and stay with you the night of your procedure.    Please read over the following fact sheets that you were given:   [facility] Preparing for Surgery and or MRSA Information   _x___ Take the following medications the morning of surgery with a sip of water :   1.  AMLODIPINE  2. LISINOPRIL  3. LEVOTHYROXINE  4.  5.  6.  ____Fleets enema or Magnesium Citrate as directed.   ____ Use CHG Soap or sage wipes as directed on instruction sheet   _x_ Use inhalers on the day of surgery and bring to hospital day of surgery (USE ADVAIR AND ALBUTEROL [place] )  ____ Stop Metformin and Janumet 2 days prior to surgery.    ____ Take 1/2 of usual insulin dose the night before surgery and none on the morning surgery      _x___ Follow recommendations from Cardiologist, Pulmonologist or PCP regarding          stopping Aspirin, Coumadin, Plavix ,Eliquis, Effient, or Pradaxa, and Pletal. (STOP ASPIRIN ONE WEEK PRIOR TO SURGERY )  X____Stop Anti-inflammatories such as Advil, Aleve, Ibuprofen, Motrin, Naproxen, Naprosyn, Goodies powders or aspirin products. OK to take Tylenol    _x___ Stop supplements until after surgery.  But may continue Vitamin D, Vitamin B, and multivitamin (STOP OMEGA 3 NOW )  ____ Bring C-Pap to the hospital.

## 2017-05-15 LAB — URINE CULTURE: CULTURE: NO GROWTH

## 2017-05-23 DIAGNOSIS — N200 Calculus of kidney: Secondary | ICD-10-CM

## 2017-05-23 DIAGNOSIS — R9341 Abnormal radiologic findings on diagnostic imaging of renal pelvis, ureter, or bladder: Secondary | ICD-10-CM

## 2017-05-23 MED ORDER — CEFAZOLIN SODIUM-DEXTROSE 2-4 GM/100ML-% IV SOLN
2.0000 g | INTRAVENOUS | Status: AC
  Administered 2017-05-24: 2 g via INTRAVENOUS

## 2017-05-24 ENCOUNTER — Ambulatory Visit
Admission: RE | Admit: 2017-05-24 | Discharge: 2017-05-24 | Disposition: A | Discharge status: Home / Self Care | Payer: Self-pay | Source: Ambulatory Visit | Attending: Urology | Admitting: Urology

## 2017-05-24 ENCOUNTER — Encounter: Payer: Self-pay | Admitting: *Deleted

## 2017-05-24 ENCOUNTER — Emergency Department
Admission: EM | Admit: 2017-05-24 | Discharge: 2017-05-25 | Disposition: A | Discharge status: Home / Self Care | Payer: Self-pay | Attending: Emergency Medicine | Admitting: Emergency Medicine

## 2017-05-24 ENCOUNTER — Encounter
Admission: RE | Disposition: A | Discharge status: Home / Self Care | Payer: Self-pay | Source: Ambulatory Visit | Attending: Urology

## 2017-05-24 ENCOUNTER — Ambulatory Visit: Payer: Self-pay | Admitting: Anesthesiology

## 2017-05-24 ENCOUNTER — Telehealth: Payer: Self-pay | Admitting: Radiology

## 2017-05-24 ENCOUNTER — Encounter: Payer: Self-pay | Admitting: Emergency Medicine

## 2017-05-24 DIAGNOSIS — J45909 Unspecified asthma, uncomplicated: Secondary | ICD-10-CM | POA: Insufficient documentation

## 2017-05-24 DIAGNOSIS — Z79899 Other long term (current) drug therapy: Secondary | ICD-10-CM | POA: Insufficient documentation

## 2017-05-24 DIAGNOSIS — E781 Pure hyperglyceridemia: Secondary | ICD-10-CM | POA: Insufficient documentation

## 2017-05-24 DIAGNOSIS — R300 Dysuria: Secondary | ICD-10-CM | POA: Insufficient documentation

## 2017-05-24 DIAGNOSIS — I10 Essential (primary) hypertension: Secondary | ICD-10-CM | POA: Insufficient documentation

## 2017-05-24 DIAGNOSIS — E039 Hypothyroidism, unspecified: Secondary | ICD-10-CM | POA: Insufficient documentation

## 2017-05-24 DIAGNOSIS — Z87891 Personal history of nicotine dependence: Secondary | ICD-10-CM | POA: Insufficient documentation

## 2017-05-24 DIAGNOSIS — Z7982 Long term (current) use of aspirin: Secondary | ICD-10-CM | POA: Insufficient documentation

## 2017-05-24 DIAGNOSIS — N400 Enlarged prostate without lower urinary tract symptoms: Secondary | ICD-10-CM | POA: Insufficient documentation

## 2017-05-24 DIAGNOSIS — N281 Cyst of kidney, acquired: Secondary | ICD-10-CM | POA: Insufficient documentation

## 2017-05-24 DIAGNOSIS — N200 Calculus of kidney: Secondary | ICD-10-CM | POA: Insufficient documentation

## 2017-05-24 DIAGNOSIS — N289 Disorder of kidney and ureter, unspecified: Secondary | ICD-10-CM

## 2017-05-24 HISTORY — PX: CYSTOSCOPY W/ URETERAL STENT PLACEMENT: SHX1429

## 2017-05-24 HISTORY — PX: CYSTOSCOPY WITH STENT PLACEMENT: SHX5790

## 2017-05-24 LAB — URINALYSIS, COMPLETE (UACMP) WITH MICROSCOPIC
BACTERIA UA: NONE SEEN
BILIRUBIN URINE: NEGATIVE
Glucose, UA: 50 mg/dL — AB
KETONES UR: 20 mg/dL — AB
Nitrite: NEGATIVE
PH: 6 (ref 5.0–8.0)
Protein, ur: 100 mg/dL — AB
SQUAMOUS EPITHELIAL / LPF: NONE SEEN
Specific Gravity, Urine: 1.025 (ref 1.005–1.030)

## 2017-05-24 SURGERY — CYSTOSCOPY, WITH STENT INSERTION
Anesthesia: General | Laterality: Left

## 2017-05-24 MED ORDER — ONDANSETRON 4 MG PO TBDP
4.0000 mg | ORAL_TABLET | Freq: Once | ORAL | Status: AC
  Administered 2017-05-24: 4 mg via ORAL
  Filled 2017-05-24: qty 1

## 2017-05-24 MED ORDER — CEFAZOLIN SODIUM-DEXTROSE 2-4 GM/100ML-% IV SOLN
INTRAVENOUS | Status: AC
  Filled 2017-05-24: qty 100

## 2017-05-24 MED ORDER — SODIUM CHLORIDE 0.9 % IR SOLN
Status: DC | PRN
  Administered 2017-05-24: 500 mL

## 2017-05-24 MED ORDER — MIDAZOLAM HCL 2 MG/2ML IJ SOLN
INTRAMUSCULAR | Status: AC
  Filled 2017-05-24: qty 2

## 2017-05-24 MED ORDER — LACTATED RINGERS IV SOLN
INTRAVENOUS | Status: DC
  Administered 2017-05-24: 07:00:00 via INTRAVENOUS

## 2017-05-24 MED ORDER — PROPOFOL 10 MG/ML IV BOLUS
INTRAVENOUS | Status: DC | PRN
  Administered 2017-05-24: 160 mg via INTRAVENOUS

## 2017-05-24 MED ORDER — ACETAMINOPHEN 10 MG/ML IV SOLN
INTRAVENOUS | Status: DC | PRN
  Administered 2017-05-24: 1000 mg via INTRAVENOUS

## 2017-05-24 MED ORDER — SEVOFLURANE IN SOLN
RESPIRATORY_TRACT | Status: AC
  Filled 2017-05-24: qty 250

## 2017-05-24 MED ORDER — PROPOFOL 10 MG/ML IV BOLUS
INTRAVENOUS | Status: AC
  Filled 2017-05-24: qty 20

## 2017-05-24 MED ORDER — ACETAMINOPHEN 10 MG/ML IV SOLN
INTRAVENOUS | Status: AC
  Filled 2017-05-24: qty 100

## 2017-05-24 MED ORDER — FENTANYL CITRATE (PF) 100 MCG/2ML IJ SOLN: 25.0000 ug | INTRAMUSCULAR | Status: DC | PRN

## 2017-05-24 MED ORDER — FAMOTIDINE 20 MG PO TABS
20.0000 mg | ORAL_TABLET | Freq: Once | ORAL | Status: AC
  Administered 2017-05-24: 20 mg via ORAL

## 2017-05-24 MED ORDER — GLYCOPYRROLATE 0.2 MG/ML IJ SOLN
INTRAMUSCULAR | Status: AC
  Filled 2017-05-24: qty 1

## 2017-05-24 MED ORDER — OXYCODONE HCL 5 MG/5ML PO SOLN: 5.0000 mg | Freq: Once | ORAL | Status: DC | PRN

## 2017-05-24 MED ORDER — DEXAMETHASONE SODIUM PHOSPHATE 10 MG/ML IJ SOLN
INTRAMUSCULAR | Status: AC
  Filled 2017-05-24: qty 1

## 2017-05-24 MED ORDER — IOTHALAMATE MEGLUMINE 43 % IV SOLN
INTRAVENOUS | Status: DC | PRN
  Administered 2017-05-24: 10 mL

## 2017-05-24 MED ORDER — ONDANSETRON HCL 4 MG/2ML IJ SOLN
INTRAMUSCULAR | Status: AC
  Filled 2017-05-24: qty 2

## 2017-05-24 MED ORDER — FENTANYL CITRATE (PF) 100 MCG/2ML IJ SOLN
50.0000 ug | INTRAMUSCULAR | Status: DC | PRN
  Administered 2017-05-24: 50 ug via NASAL
  Filled 2017-05-24: qty 2

## 2017-05-24 MED ORDER — LIDOCAINE HCL (PF) 2 % IJ SOLN
INTRAMUSCULAR | Status: AC
Start: 2017-05-24 — End: 2017-05-24
  Filled 2017-05-24: qty 2

## 2017-05-24 MED ORDER — SUGAMMADEX SODIUM 500 MG/5ML IV SOLN
INTRAVENOUS | Status: AC
Start: 2017-05-24 — End: 2017-05-24
  Filled 2017-05-24: qty 5

## 2017-05-24 MED ORDER — SUGAMMADEX SODIUM 500 MG/5ML IV SOLN
INTRAVENOUS | Status: DC | PRN
  Administered 2017-05-24: 300 mg via INTRAVENOUS

## 2017-05-24 MED ORDER — ONDANSETRON HCL 4 MG/2ML IJ SOLN
INTRAMUSCULAR | Status: DC | PRN
  Administered 2017-05-24: 4 mg via INTRAVENOUS

## 2017-05-24 MED ORDER — MIDAZOLAM HCL 2 MG/2ML IJ SOLN
INTRAMUSCULAR | Status: DC | PRN
  Administered 2017-05-24: 2 mg via INTRAVENOUS

## 2017-05-24 MED ORDER — SUCCINYLCHOLINE CHLORIDE 20 MG/ML IJ SOLN
INTRAMUSCULAR | Status: AC
  Filled 2017-05-24: qty 1

## 2017-05-24 MED ORDER — LIDOCAINE HCL (CARDIAC) 20 MG/ML IV SOLN
INTRAVENOUS | Status: DC | PRN
  Administered 2017-05-24: 100 mg via INTRAVENOUS

## 2017-05-24 MED ORDER — ROCURONIUM BROMIDE 100 MG/10ML IV SOLN
INTRAVENOUS | Status: DC | PRN
  Administered 2017-05-24: 40 mg via INTRAVENOUS

## 2017-05-24 MED ORDER — ROCURONIUM BROMIDE 50 MG/5ML IV SOLN
INTRAVENOUS | Status: AC
Start: 2017-05-24 — End: 2017-05-24
  Filled 2017-05-24: qty 1

## 2017-05-24 MED ORDER — CEPHALEXIN 500 MG PO CAPS: 500.0000 mg | ORAL_CAPSULE | Freq: Three times a day (TID) | ORAL | 0 refills | Status: DC

## 2017-05-24 MED ORDER — FENTANYL CITRATE (PF) 100 MCG/2ML IJ SOLN
INTRAMUSCULAR | Status: AC
  Filled 2017-05-24: qty 2

## 2017-05-24 MED ORDER — FAMOTIDINE 20 MG PO TABS
ORAL_TABLET | ORAL | Status: AC
  Filled 2017-05-24: qty 1

## 2017-05-24 MED ORDER — ONDANSETRON 4 MG PO TBDP
ORAL_TABLET | ORAL | Status: AC
  Administered 2017-05-24: 4 mg via ORAL
  Filled 2017-05-24: qty 1

## 2017-05-24 MED ORDER — OXYCODONE HCL 5 MG PO TABS: 5.0000 mg | ORAL_TABLET | Freq: Once | ORAL | Status: DC | PRN

## 2017-05-24 MED ORDER — HYDROCODONE-ACETAMINOPHEN 5-325 MG PO TABS: 1.0000 | ORAL_TABLET | ORAL | 0 refills | Status: DC | PRN

## 2017-05-24 MED ORDER — FENTANYL CITRATE (PF) 100 MCG/2ML IJ SOLN
INTRAMUSCULAR | Status: DC | PRN
  Administered 2017-05-24: 100 ug via INTRAVENOUS

## 2017-05-24 MED ORDER — DEXAMETHASONE SODIUM PHOSPHATE 10 MG/ML IJ SOLN
INTRAMUSCULAR | Status: DC | PRN
  Administered 2017-05-24: 10 mg via INTRAVENOUS

## 2017-05-24 SURGICAL SUPPLY — 37 items
BACTOSHIELD CHG 4% 4OZ (MISCELLANEOUS)
BASKET ZERO TIP 1.9FR (BASKET) IMPLANT
BRUSH SCRUB 4% CHG (MISCELLANEOUS) ×2 IMPLANT
BSKT STON RTRVL ZERO TP 1.9FR (BASKET)
CATH URETL 5X70 OPEN END (CATHETERS) ×4 IMPLANT
CNTNR SPEC 2.5X3XGRAD LEK (MISCELLANEOUS) ×2
CONT SPEC 4OZ STER OR WHT (MISCELLANEOUS) ×2
CONT SPEC 4OZ STRL OR WHT (MISCELLANEOUS) ×2
CONTAINER SPEC 2.5X3XGRAD LEK (MISCELLANEOUS) ×2 IMPLANT
FIBER LASER LITHO 273 (Laser) IMPLANT
FORCEPS BIOP PIRANHA Y (CUTTING FORCEPS) ×2 IMPLANT
GLOVE BIO SURGEON STRL SZ7 (GLOVE) ×8 IMPLANT
GLOVE BIO SURGEON STRL SZ7.5 (GLOVE) ×4 IMPLANT
GOWN L4 LG 24 PK N/S (GOWN DISPOSABLE) ×4 IMPLANT
GOWN STRL REUS W/ TWL LRG LVL4 (GOWN DISPOSABLE) ×2 IMPLANT
GOWN STRL REUS W/ TWL XL LVL3 (GOWN DISPOSABLE) ×2 IMPLANT
GOWN STRL REUS W/TWL LRG LVL4 (GOWN DISPOSABLE) ×4
GOWN STRL REUS W/TWL XL LVL3 (GOWN DISPOSABLE) ×8 IMPLANT
GUIDEWIRE INTRO SET STRAIGHT (WIRE) ×2 IMPLANT
GUIDEWIRE SUPER STIFF (WIRE) ×2 IMPLANT
INTRODUCER DILATOR DOUBLE (INTRODUCER) ×4 IMPLANT
KIT RM TURNOVER CYSTO AR (KITS) ×4 IMPLANT
PACK CYSTO AR (MISCELLANEOUS) ×4 IMPLANT
SCRUB CHG 4% DYNA-HEX 4OZ (MISCELLANEOUS) ×2 IMPLANT
SENSORWIRE 0.038 NOT ANGLED (WIRE) ×4
SET CYSTO W/LG BORE CLAMP LF (SET/KITS/TRAYS/PACK) ×4 IMPLANT
SHEATH URETERAL 12FR 45CM (SHEATH) ×2 IMPLANT
SHEATH URETERAL 13/15X36 1L (SHEATH) ×2 IMPLANT
SOL .9 NS 3000ML IRR  AL (IV SOLUTION) ×2
SOL .9 NS 3000ML IRR AL (IV SOLUTION) ×2
SOL .9 NS 3000ML IRR UROMATIC (IV SOLUTION) ×2 IMPLANT
STENT URET 6FRX24 CONTOUR (STENTS) IMPLANT
STENT URET 6FRX26 CONTOUR (STENTS) IMPLANT
SURGILUBE 2OZ TUBE FLIPTOP (MISCELLANEOUS) ×4 IMPLANT
SYRINGE IRR TOOMEY STRL 70CC (SYRINGE) ×4 IMPLANT
WATER STERILE IRR 1000ML POUR (IV SOLUTION) ×4 IMPLANT
WIRE SENSOR 0.038 NOT ANGLED (WIRE) ×2 IMPLANT

## 2017-05-24 NOTE — Telephone Encounter (Signed)
Pt c/o severe stent pain when urinating following surgery today. Per Dr Pilar Jarvis, Myrbetriq 50mg  daily samples were left at front desk for pt's wife to pick up.

## 2017-05-24 NOTE — Anesthesia Postprocedure Evaluation (Signed)
Anesthesia Post Note  Patient: Jeffrey Tucker  Procedure(s) Performed: Procedure(s) (LRB): , cystoscopy,left ureteral stent placement and left ureteroscopy attempted, retrograde pylogram (Left) left  Patient location during evaluation: PACU Anesthesia Type: General Level of consciousness: awake and alert Pain management: pain level controlled Vital Signs Assessment: post-procedure vital signs reviewed and stable Respiratory status: spontaneous breathing, nonlabored ventilation, respiratory function stable and patient connected to nasal cannula oxygen Cardiovascular status: blood pressure returned to baseline and stable Postop Assessment: no signs of nausea or vomiting Anesthetic complications: no     Last Vitals:  Vitals:   05/24/17 0902 05/24/17 0915  BP: (!) 145/50 (!) 185/44  Pulse: (!) 53   Resp: (!) 39 16  Temp: 36.3 C (!) 36.1 C    Last Pain:  Vitals:   05/24/17 0915  TempSrc:   PainSc: 0-No pain                 Precious Haws P[place]it[place]

## 2017-05-24 NOTE — Anesthesia Preprocedure Evaluation (Signed)
Anesthesia Evaluation  Patient identified by MRN, date of birth, ID band Patient awake    Reviewed: Allergy & Precautions, H&P , NPO status , Patient's Chart, lab work & pertinent test results  History of Anesthesia Complications Negative for: history of anesthetic complications  Airway Mallampati: III  TM Distance: >3 FB Neck ROM: full    Dental  (+) Chipped, Caps   Pulmonary neg shortness of breath, asthma , former smoker,           Cardiovascular Exercise Tolerance: Good hypertension, (-) angina+ Peripheral Vascular Disease  (-) Past MI and (-) DOE + Valvular Problems/Murmurs      Neuro/Psych PSYCHIATRIC DISORDERS negative neurological ROS     GI/Hepatic negative GI ROS, Neg liver ROS,   Endo/Other  Hypothyroidism   Renal/GU      Musculoskeletal   Abdominal   Peds  Hematology negative hematology ROS (+)   Anesthesia Other Findings Past Medical History: No date: Allergy     Comment: allergic rhinitis No date: Asthma     Comment: mostly in childhood No date: BPH (benign prostatic hypertrophy) No date: Heart murmur No date: History of kidney stones No date: Hypertension No date: Hypertriglyceridemia No date: Hypothyroidism No date: Thyroid disease     Comment: hypothyroidism  Past Surgical History: 2006: CARDIAC CATHETERIZATION     Comment: [place] 2006: CARDIOVASCULAR STRESS TEST     Comment: [place] 2006: CHOLECYSTECTOMY No date: EYE SURGERY Bilateral     Comment: Cataract Extraction with IOL 1997: VEIN LIGATION AND STRIPPING Bilateral     Reproductive/Obstetrics negative OB ROS                             Anesthesia Physical Anesthesia Plan  ASA: III  Anesthesia Plan: General ETT   Post-op Pain Management:    Induction: Intravenous  PONV Risk Score and Plan: 2 and Ondansetron and Dexamethasone  Airway Management Planned: Oral ETT  Additional Equipment:    Intra-op Plan:   Post-operative Plan: Extubation in OR  Informed Consent: I have reviewed the patients History and Physical, chart, labs and discussed the procedure including the risks, benefits and alternatives for the proposed anesthesia with the patient or authorized representative who has indicated his/her understanding and acceptance.   Dental Advisory Given  Plan Discussed with: Anesthesiologist, CRNA and Surgeon  Anesthesia Plan Comments: (Patient consented for risks of anesthesia including but not limited to:  - adverse reactions to medications - damage to teeth, lips or other oral mucosa - sore throat or hoarseness - Damage to heart, brain, lungs or loss of life  Patient voiced understanding.)        Anesthesia Quick Evaluation

## 2017-05-24 NOTE — Discharge Instructions (Signed)
Cystoscopy Cystoscopy is a procedure that is used to help diagnose and sometimes treat conditions that affect that lower urinary tract. The lower urinary tract includes the bladder and the tube that drains urine from the bladder out of the body (urethra). Cystoscopy is performed with a thin, tube-shaped instrument with a light and camera at the end (cystoscope). The cystoscope may be hard (rigid) or flexible, depending on the goal of the procedure.The cystoscope is inserted through the urethra, into the bladder. Cystoscopy may be recommended if you have:  Urinary tractinfections that keep coming back (recurring).  Blood in the urine (hematuria).  Loss of bladder control (urinary incontinence) or an overactive bladder.  Unusual cells found in a urine sample.  A blockage in the urethra.  Painful urination.  An abnormality in the bladder found during an intravenous pyelogram (IVP) or CT scan.  Cystoscopy may also be done to remove a sample of tissue to be examined under a microscope (biopsy). Tell a health care provider about:  Any allergies you have.  All medicines you are taking, including vitamins, herbs, eye drops, creams, and over-the-counter medicines.  Any problems you or family members have had with anesthetic medicines.  Any blood disorders you have.  Any surgeries you have had.  Any medical conditions you have.  Whether you are pregnant or may be pregnant. What are the risks? Generally, this is a safe procedure. However, problems may occur, including:  Infection.  Bleeding.  Allergic reactions to medicines.  Damage to other structures or organs.  What happens before the procedure?  Ask your health care provider about: ? Changing or stopping your regular medicines. This is especially important if you are taking diabetes medicines or blood thinners. ? Taking medicines such as aspirin and ibuprofen. These medicines can thin your blood. Do not take these medicines  before your procedure if your health care provider instructs you not to.  Follow instructions from your health care provider about eating or drinking restrictions.  You may be given antibiotic medicine to help prevent infection.  You may have an exam or testing, such as X-rays of the bladder, urethra, or kidneys.  You may have urine tests to check for signs of infection.  Plan to have someone take you home after the procedure. What happens during the procedure?  To reduce your risk of infection,your health care team will wash or sanitize their hands.  You will be given one or more of the following: ? A medicine to help you relax (sedative). ? A medicine to numb the area (local anesthetic).  The area around the opening of your urethra will be cleaned.  The cystoscope will be passed through your urethra into your bladder.  Germ-free (sterile)fluid will flow through the cystoscope to fill your bladder. The fluid will stretch your bladder so that your surgeon can clearly examine your bladder walls.  The cystoscope will be removed and your bladder will be emptied. The procedure may vary among health care providers and hospitals. What happens after the procedure?  You may have some soreness or pain in your abdomen and urethra. Medicines will be available to help you.  You may have some blood in your urine.  Do not drive for 24 hours if you received a sedative. This information is not intended to replace advice given to you by your health care provider. Make sure you discuss any questions you have with your health care provider. Document Released: 11/09/2000 Document Revised: 03/22/2016 Document Reviewed: 09/29/2015 Elsevier Interactive   Patient Education  2017 [employer].

## 2017-05-24 NOTE — Op Note (Signed)
Date of procedure: 05/24/17  Preoperative diagnosis:  1. Left renal pelvis filling defect 2. Left nephrolithiasis   Postoperative diagnosis:  1. Same   Procedure: 1. Cystoscopy 2. Attempted left ureteroscopy 3. Left retrograde pyelogram with interpretation 4. Left ureteral stent placement 6 French by 26 cm  Surgeon: Baruch Gouty, MD  Anesthesia: General  Complications: None  Intraoperative findings: The patient's distal proximal ureter were very narrow. Attempts were made to place an access sheath after dilation of the ureter as well as place a single channel flexible ureteroscope. None of these were able to successfully pass the proximal ureter. Attempts were aborted as not to risk undue trauma. Left retrograde pyelogram was used to identify the left collecting system. The patient was temporized with a left ureteral stent.  EBL: None  Specimens: None  Drains: Left 6 French by 26 cm double-J ureteral stent  Disposition: Stable to the postanesthesia care unit  Indication for procedure: The patient is a 61 y.o. male with history of a left renal pelvis filling defect in left nonobstructing nephrolithiasis presents today for diagnostic evaluation.  After reviewing the management options for treatment, the patient elected to proceed with the above surgical procedure(s). We have discussed the potential benefits and risks of the procedure, side effects of the proposed treatment, the likelihood of the patient achieving the goals of the procedure, and any potential problems that might occur during the procedure or recuperation. Informed consent has been obtained.  Description of procedure: The patient was met in the preoperative area. All risks, benefits, and indications of the procedure were described in great detail. The patient consented to the procedure. Preoperative antibiotics were given. The patient was taken to the operative theater. General anesthesia was induced per the anesthesia  service. The patient was then placed in the dorsal lithotomy position and prepped and draped in the usual sterile fashion. A preoperative timeout was called.   A 20 French 30 cystoscope was inserted into the patient's bladder per urethra atraumatically. The left ureteral orifice was identified with the aid of a dual-lumen catheter 2 sensor wires were advanced level of the renal pelvis under fluoroscopy. Attempt was made to place a ureteral access sheath out this would not advance past the distal ureter. The inner portion of the access sheath was then used to try to dilate the ureter which was unsuccessful. Using an 8 French ureteral dilator, I still was unable to dilate the distal ureter enough to accommodate the ureteral access sheath atraumatically. At this point, a single channel ureteroscope was then advanced over the sensor wire until the proximal ureter but could not be advanced father. The sensor wire was removed and attempt was made to advance further direct visualization but this was not possible due to the narrowing of the proximal ureter. At this point, a Super Stiff guidewire was then placed through the ureteroscope into the renal pelvis and attempt was again made to pass the ureteroscope past this narrowing. This too did not advance the ureteroscope. At this point to avoid undue trauma further attempts at advancing the ureteroscope was aborted. A left retrograde powder was obtained through the ureteroscope to identify the collecting system. The ureteroscope was then removed under direct visualization showing no significant trauma. The cystoscope was reassembled over the remaining sensor wire and a 6 [place] by 26 cm double-J ureteral stent was placed. The sensor wire was removed. A curl seen in the patient's renal pelvis under fluoroscopy and in the urinary bladder under direct visualization. At  this point, the patient's bladder was treated with Glucophage fusion transferred in stable condition to the  postanesthesia care unit.  Plan: The patient will follow-up in 2 weeks for repeat ureteroscopy after passive dilation of the left ureter with the left ureteral stent.  Baruch Gouty, M.D.

## 2017-05-24 NOTE — Interval H&P Note (Signed)
History and Physical Interval Note:  05/24/2017 7:23 AM  Jeffrey Tucker  has presented today for surgery, with the diagnosis of LEFT RENAL PELVIS LESION  The various methods of treatment have been discussed with the patient and family. After consideration of risks, benefits and other options for treatment, the patient has consented to  Procedure(s): URETEROSCOPY WITH HOLMIUM LASER LITHOTRIPSY (Left) CYSTOSCOPY WITH STENT PLACEMENT (Left) CYSTOSCOPY WITH RENAL PELVIS BIOPSY POSSIBLE LASER ABLATION OF TUMOR (Left) as a surgical intervention .  The patient's history has been reviewed, patient examined, no change in status, stable for surgery.  I have reviewed the patient's chart and labs.  Questions were answered to the patient's satisfaction.    RRR Lungs clear  Nickie Retort

## 2017-05-24 NOTE — Anesthesia Procedure Notes (Signed)
Procedure Name: Intubation Date/Time: 05/24/2017 7:43 AM Performed by: Doreen Salvage Pre-anesthesia Checklist: Patient identified, Patient being monitored, Timeout performed, Emergency Drugs available and Suction available Patient Re-evaluated:Patient Re-evaluated prior to inductionOxygen Delivery Method: Circle system utilized Preoxygenation: Pre-oxygenation with 100% oxygen Intubation Type: IV induction Ventilation: Mask ventilation without difficulty Laryngoscope Size: Mac, 3 and 4 Grade View: Grade I Tube type: Oral Tube size: 7.5 mm Number of attempts: 2 Airway Equipment and Method: Stylet Placement Confirmation: ETT inserted through vocal cords under direct vision,  positive ETCO2 and breath sounds checked- equal and bilateral Secured at: 25 (at the lip) cm Tube secured with: Tape Dental Injury: Teeth and Oropharynx as per pre-operative assessment

## 2017-05-24 NOTE — Transfer of Care (Signed)
Immediate Anesthesia Transfer of Care Note  Patient: Jeffrey Tucker  Procedure(s) Performed: Procedure(s): , cystoscopy,left ureteral stent placement and left ureteroscopy attempted, retrograde pylogram (Left) left  Patient Location: PACU  Anesthesia Type:General  Level of Consciousness: sedated  Airway & Oxygen Therapy: Patient Spontanous Breathing and Patient connected to face mask oxygen  Post-op Assessment: Report given to RN and Post -op Vital signs reviewed and stable  Post vital signs: Reviewed and stable  Last Vitals:  Vitals:   05/24/17 0622 05/24/17 0832  BP: (!) 154/41 (!) 168/51  Pulse: (!) 52 (!) 57  Resp: 18 (!) 21  Temp: 36.7 C 49.4 C    Complications: No apparent anesthesia complications

## 2017-05-24 NOTE — Anesthesia Post-op Follow-up Note (Cosign Needed)
Anesthesia QCDR form completed.        

## 2017-05-24 NOTE — ED Triage Notes (Addendum)
Pt reports had cystoscopy this morning with stents placed. Pt has history of tumor on bladder but it is unknown at this point if it is malignant. Pt reports the surgery today has begun the process of determining pathology. Pt is scheduled 06/11/17 for more surgery. Pt reports went home today and urinated several times without pain. Pt reports the last three times he urinated the pain in his left groin was excruciating to the point of almost passing out. No protocols at this time per Dr. Archie Balboa.

## 2017-05-24 NOTE — H&P (View-Only) (Signed)
05/09/2017 4:03 PM   Jeffrey Tucker [birth year] [id number]  Referring provider: Venia Carbon, MD [address] [place], [place] [address]  Chief Complaint  Patient presents with  . Results    CT     HPI: The patient is a 61 year old gentleman who presents today for evaluation of proximal left ureteral enhancement consistent with regional infection on a recent CT scan. This is per CT report from [facility] which I do not have access to the images. Repeat CT urogram at this time shows persistence of this area with concern for possible TCC of his left upper tract. He does have 2 nonobstructive left renal calculi as well with the largest being approximately 4 mm. CT shows benign renal cysts as well  The CT was performed for left lower quadrant pain that had occurred for 3-4 months. He noted his intermittent pain in his left lower quadrant that was sharp. He will require pain medication once or twice per month 4. He has never had this before. He denies hematuria. He denies known history of nephrolithiasis. He denies left flank pain.  Repeat CT shows no explanation for his symptoms.  The patient does note nocturia 1-3. Does strain. He does have some hesitancy and weak stream. He is not interested in medications at this time.  The patient last had a PSA in November 2017 which was 0.46.     PMH: Past Medical History:  Diagnosis Date  . Allergy    allergic rhinitis  . Asthma    mostly in childhood  . BPH (benign prostatic hypertrophy)   . Hypertension   . Hypertriglyceridemia   . Thyroid disease    hypothyroidism    Surgical History: Past Surgical History:  Procedure Laterality Date  . CARDIAC CATHETERIZATION  2006   [place]  . CARDIOVASCULAR STRESS TEST  2006   [place]  . CHOLECYSTECTOMY  2006  . VEIN LIGATION AND STRIPPING  1997    Home Medications:  Allergies as of 05/09/2017      Reactions   Tetracycline Hcl    REACTION: joint pain   Amlodipine Swelling   Lower extremity swelling--tolerated 5mg  dose      Medication List       Accurate as of 05/09/17  4:03 PM. Always use your most recent med list.          albuterol 108 (90 Base) MCG/ACT inhaler Commonly known as:  PROAIR HFA Inhale 2 puffs into the lungs 3 (three) times daily as needed. Asthma flare up   amLODipine 5 MG tablet Commonly known as:  NORVASC Take 1 tablet (5 mg total) by mouth daily.   aspirin 81 MG tablet Take 81 mg by mouth daily.   Fish Oil 1000 MG Caps Take by mouth.   levothyroxine 125 MCG tablet Commonly known as:  SYNTHROID, LEVOTHROID Take 1 tablet (125 mcg total) by mouth daily.   lisinopril 10 MG tablet Commonly known as:  PRINIVIL,ZESTRIL Take 1 tablet (10 mg total) by mouth daily.       Allergies:  Allergies  Allergen Reactions  . Tetracycline Hcl     REACTION: joint pain  . Amlodipine Swelling    Lower extremity swelling--tolerated 5mg  dose    Family History: Family History  Problem Relation Age of Onset  . Alcohol abuse Father   . Prostate cancer Father   . Alcohol abuse Mother   . Asthma Mother   . Liver cancer Paternal Grandmother   . Colon  cancer Neg Hx   . Coronary artery disease Neg Hx   . Hypertension Neg Hx   . Diabetes Neg Hx   . Bladder Cancer Neg Hx   . Kidney cancer Neg Hx     Social History:  reports that he quit smoking about 38 years ago. He has never used smokeless tobacco. He reports that he does not drink alcohol or use drugs.  ROS: UROLOGY Frequent Urination?: No Hard to postpone urination?: No Burning/pain with urination?: No Get up at night to urinate?: No Leakage of urine?: No Urine stream starts and stops?: No Trouble starting stream?: No Do you have to strain to urinate?: No Blood in urine?: No Urinary tract infection?: No Sexually transmitted disease?: No Injury to kidneys or bladder?: No Painful intercourse?: No Weak stream?: No Erection problems?: No Penile pain?:  No  Gastrointestinal Nausea?: No Vomiting?: No Indigestion/heartburn?: No Diarrhea?: No Constipation?: No  Constitutional Fever: No Night sweats?: No Weight loss?: No Fatigue?: No  Skin Skin rash/lesions?: No Itching?: No  Eyes Blurred vision?: No Double vision?: No  Ears/Nose/Throat Sore throat?: No Sinus problems?: No  Hematologic/Lymphatic Swollen glands?: No Easy bruising?: No  Cardiovascular Leg swelling?: No Chest pain?: No  Respiratory Cough?: No Shortness of breath?: No  Endocrine Excessive thirst?: No  Musculoskeletal Back pain?: No Joint pain?: No  Neurological Headaches?: No Dizziness?: No  Psychologic Depression?: No Anxiety?: No  Physical Exam: BP (!) 154/61   Pulse (!) 56   Ht 5\' 6"  (1.676 m)   Wt 162 lb 1.6 oz (73.5 kg)   BMI 26.16 kg/m   Constitutional:  Alert and oriented, No acute distress. HEENT: [place] AT, moist mucus membranes.  Trachea midline, no masses. Cardiovascular: No clubbing, cyanosis, or edema. Respiratory: Normal respiratory effort, no increased work of breathing. GI: Abdomen is soft, nontender, nondistended, no abdominal masses GU: No CVA tenderness.  Skin: No rashes, bruises or suspicious lesions. Lymph: No cervical or inguinal adenopathy. Neurologic: Grossly intact, no focal deficits, moving all 4 extremities. Psychiatric: Normal mood and affect.  Laboratory Data: Lab Results  Component Value Date   WBC 7.6 02/01/2017   HGB 13.7 02/01/2017   HCT 40.5 02/01/2017   MCV 92.5 02/01/2017   PLT 285 02/01/2017    Lab Results  Component Value Date   CREATININE 0.90 05/02/2017    Lab Results  Component Value Date   PSA 0.46 09/28/2016   PSA 0.56 09/20/2015   PSA 0.33 09/09/2013    Lab Results  Component Value Date   TESTOSTERONE 295 (L) 09/20/2015    No results found for: HGBA1C  Urinalysis    Component Value Date/Time   APPEARANCEUR Negative 04/11/2017 1452   GLUCOSEU Negative 04/11/2017  1452   BILIRUBINUR Negative 04/11/2017 1452   PROTEINUR Negative 04/11/2017 1452   NITRITE Negative 04/11/2017 1452   LEUKOCYTESUR Negative 04/11/2017 1452    Pertinent Imaging: CT urogram reviewed as above.  Assessment & Plan:   1. Left renal pelvis lesion I discussed the patient that he is a filling defect in the left renal pelvis/proximal ureter that could potentially represent a malignancy. We discussed at this point the best option would be to undergo cystoscopy, left ureteroscopy, possible tumor biopsy, possible laser ablation of tumor, left ureteral stent placement. We did discuss that this could be a TCC of the bladder and depending on the grade this may be enough to treat the disease though if he has high-grade disease hernia removal of his left kidney and ureter.  We discussed the risks, benefits, and indications of the procedure. He understands the risks include but are not limited to bleeding, infection, iatrogenic injury, need for repeat procedures, need for ureteral stent placement. All questions were answered. He is agreeable to proceeding.  2. Renal cyst Benign on CT Urogram  3. Left nephrolithiasis May attempt removal of the time of his left ureteroscopy if feasible  4. BPH The patient is not bothered by symptoms  5. Prostate cancer screening Up-to-date.  Nickie Retort, MD  [facility] [address] [place], [place] [address] [phone]

## 2017-05-25 MED ORDER — OXYCODONE HCL 5 MG PO TABS
15.0000 mg | ORAL_TABLET | Freq: Once | ORAL | Status: AC
  Administered 2017-05-25: 15 mg via ORAL
  Filled 2017-05-25: qty 3

## 2017-05-25 MED ORDER — OXYCODONE HCL 15 MG PO TABS: 15.0000 mg | ORAL_TABLET | ORAL | 0 refills | Status: DC | PRN

## 2017-05-25 NOTE — ED Provider Notes (Signed)
[facility] Emergency Department Provider Note    First MD Initiated Contact with Patient 05/25/17 0015     (approximate)  I have reviewed the triage vital signs and the nursing notes.   HISTORY  Chief Complaint Post op problem    HPI Jeffrey Tucker is a 61 y.o. male presents to the emergency department status post cystoscopy performed yesterday with 10 out of 10 pain with urination. Patient states no pain until he starts to urinate. Patient describes the pain is burning all over though he also states that there is a very sharp intense component as well. Patient denies any fever. Patient denies any abdominal pain.  Past Medical History:  Diagnosis Date  . Allergy    allergic rhinitis  . Asthma    mostly in childhood  . BPH (benign prostatic hypertrophy)   . Heart murmur   . History of kidney stones   . Hypertension   . Hypertriglyceridemia   . Hypothyroidism   . Thyroid disease    hypothyroidism    Patient Active Problem List   Diagnosis Date Noted  . Essential hypertension, benign 02/28/2017  . LLQ abdominal pain 11/20/2016  . Advance care planning 09/30/2016  . Cough 09/01/2016  . Mild intermittent asthma 08/21/2016  . Decreased libido 09/20/2015  . Hypertriglyceridemia   . Benign prostatic hyperplasia   . Ascending aorta dilatation (HCC) 08/21/2013  . Aortic valve regurgitation 07/20/2011  . Bicuspid aortic valve 07/02/2011  . Encounter for screening examination for infectious disease 05/25/2011  . Mild intermittent asthma without complication 47/65/4650  . ALLERGIC RHINITIS 04/27/2009  . Hypothyroidism 04/14/2008    Past Surgical History:  Procedure Laterality Date  . CARDIAC CATHETERIZATION  2006   [place]  . CARDIOVASCULAR STRESS TEST  2006   [place]  . CHOLECYSTECTOMY  2006  . CYSTOSCOPY W/ URETERAL STENT PLACEMENT  05/24/2017   Procedure: left;  Surgeon: Nickie Retort, MD;  Location: [facility] ORS;  Service: Urology;;  .  Consuela Mimes WITH STENT PLACEMENT Left 05/24/2017   Procedure: , cystoscopy,left ureteral stent placement and left ureteroscopy attempted, retrograde pylogram;  Surgeon: Nickie Retort, MD;  Location: [facility] ORS;  Service: Urology;  Laterality: Left;  . EYE SURGERY Bilateral    Cataract Extraction with IOL  . VEIN LIGATION AND STRIPPING Bilateral 1997    Prior to Admission medications   Medication Sig Start Date End Date Taking? Authorizing Provider  albuterol (PROAIR HFA) 108 (90 Base) MCG/ACT inhaler Inhale 2 puffs into the lungs 3 (three) times daily as needed. Asthma flare up Patient taking differently: Inhale 2 puffs into the lungs 3 (three) times daily as needed for wheezing or shortness of breath. Asthma flare up 02/28/17  Yes Viviana Simpler I, MD  amLODipine (NORVASC) 5 MG tablet Take 1 tablet (5 mg total) by mouth daily. 02/28/17  Yes Venia Carbon, MD  aspirin 81 MG tablet Take 81 mg by mouth daily.   Yes [provider]  cephALEXin (KEFLEX) 500 MG capsule Take 1 capsule (500 mg total) by mouth 3 (three) times daily. 05/24/17  Yes Nickie Retort, MD  cetirizine (ZYRTEC) 10 MG tablet Take 10 mg by mouth daily.   Yes [provider]  Fluticasone-Salmeterol (ADVAIR) 100-50 MCG/DOSE AEPB Inhale 1 puff into the lungs daily as needed.   Yes [provider]  HYDROcodone-acetaminophen (NORCO) 5-325 MG tablet Take 1-2 tablets by mouth every 4 (four) hours as needed for moderate pain. 05/24/17  Yes Baruch Gouty  Jeneen Rinks, MD  ibuprofen (ADVIL,MOTRIN) 200 MG tablet Take 400 mg by mouth every 8 (eight) hours as needed for moderate pain.   Yes [provider]  levothyroxine (SYNTHROID, LEVOTHROID) 125 MCG tablet Take 1 tablet (125 mcg total) by mouth daily. 09/28/16  Yes Tonia Ghent, MD  lisinopril (PRINIVIL,ZESTRIL) 10 MG tablet Take 1 tablet (10 mg total) by mouth daily. 04/17/17  Yes Venia Carbon, MD  Omega-3 Fatty Acids (EQL OMEGA 3 FISH OIL) 1200 MG  CAPS Take 2 capsules by mouth daily.   Yes [provider]  oxyCODONE (ROXICODONE) 15 MG immediate release tablet Take 1 tablet (15 mg total) by mouth every 4 (four) hours as needed for pain. 05/25/17   Gregor Hams, MD    Allergies Tetracycline hcl and Amlodipine  Family History  Problem Relation Age of Onset  . Alcohol abuse Father   . Prostate cancer Father   . Alcohol abuse Mother   . Asthma Mother   . Liver cancer Paternal Grandmother   . Colon cancer Neg Hx   . Coronary artery disease Neg Hx   . Hypertension Neg Hx   . Diabetes Neg Hx   . Bladder Cancer Neg Hx   . Kidney cancer Neg Hx     Social History Social History  Substance Use Topics  . Smoking status: Former Smoker    Types: Cigarettes    Quit date: 11/26/1978  . Smokeless tobacco: Never Used     Comment: social smoked till 1980's  . Alcohol use No     Comment: none since 1984    Review of Systems Constitutional: No fever/chills Eyes: No visual changes. ENT: No sore throat. Cardiovascular: Denies chest pain. Respiratory: Denies shortness of breath. Gastrointestinal: No abdominal pain.  No nausea, no vomiting.  No diarrhea.  No constipation. Genitourinary: Positive for dysuria. Musculoskeletal: Negative for neck pain.  Negative for back pain. Integumentary: Negative for rash. Neurological: Negative for headaches, focal weakness or numbness.   ____________________________________________   PHYSICAL EXAM:  VITAL SIGNS: ED Triage Vitals  Enc Vitals Group     BP 05/24/17 1805 (!) 192/46     Pulse Rate 05/24/17 1805 (!) 59     Resp 05/24/17 1805 18     Temp 05/24/17 1805 97.9 F (36.6 C)     Temp Source 05/24/17 1805 Oral     SpO2 05/24/17 1805 100 %     Weight 05/24/17 1806 71.7 kg (158 lb)     Height 05/24/17 1806 1.676 m (5\' 6" )     Head Circumference --      Peak Flow --      Pain Score 05/24/17 1805 2     Pain Loc --      Pain Edu? --      Excl. in [place]? --      Constitutional: Alert and oriented. Well appearing and in no acute distress. Eyes: Conjunctivae are normal.  Head: Atraumatic. Mouth/Throat: Mucous membranes are moist.  Oropharynx non-erythematous. Neck: No stridor.  Cardiovascular: Normal rate, regular rhythm. Good peripheral circulation. Grossly normal heart sounds. Respiratory: Normal respiratory effort.  No retractions. Lungs CTAB. Gastrointestinal: Soft and nontender. No distention.  Musculoskeletal: No lower extremity tenderness nor edema. No gross deformities of extremities. Neurologic:  Normal speech and language. No gross focal neurologic deficits are appreciated.  Skin:  Skin is warm, dry and intact. No rash noted. Psychiatric: Mood and affect are normal. Speech and behavior are normal.  ____________________________________________   LABS (  all labs ordered are listed, but only abnormal results are displayed)  Labs Reviewed  URINALYSIS, COMPLETE (UACMP) WITH MICROSCOPIC - Abnormal; Notable for the following:       Result Value   Color, Urine YELLOW (*)    APPearance CLOUDY (*)    Glucose, UA 50 (*)    Hgb urine dipstick LARGE (*)    Ketones, ur 20 (*)    Protein, ur 100 (*)    Leukocytes, UA TRACE (*)    All other components within normal limits   ____________________________________________    Procedures   ____________________________________________   INITIAL IMPRESSION / ASSESSMENT AND PLAN / ED COURSE  Pertinent labs & imaging results that were available during my care of the patient were reviewed by me and considered in my medical decision making (see chart for details).  Patient given 15 mg of oxycodone in the emergency department and allowed to drink. Patient subsequently urinated and states that his pain was much improved and is requesting to be discharged home at this time.      ____________________________________________  FINAL CLINICAL IMPRESSION(S) / ED DIAGNOSES  Final diagnoses:   Dysuria     MEDICATIONS GIVEN DURING THIS VISIT:  Medications  fentaNYL (SUBLIMAZE) injection 50 mcg (50 mcg Nasal Given 05/24/17 1925)  ondansetron (ZOFRAN-ODT) disintegrating tablet 4 mg (4 mg Oral Given 05/24/17 1926)  oxyCODONE (Oxy IR/ROXICODONE) immediate release tablet 15 mg (15 mg Oral Given 05/25/17 0051)     NEW OUTPATIENT MEDICATIONS STARTED DURING THIS VISIT:  New Prescriptions   OXYCODONE (ROXICODONE) 15 MG IMMEDIATE RELEASE TABLET    Take 1 tablet (15 mg total) by mouth every 4 (four) hours as needed for pain.    Modified Medications   No medications on file    Discontinued Medications   No medications on file     Note:  This document was prepared using Dragon voice recognition software and may include unintentional dictation errors.    Gregor Hams, MD 05/25/17 [phone]

## 2017-05-26 DIAGNOSIS — E039 Hypothyroidism, unspecified: Secondary | ICD-10-CM | POA: Insufficient documentation

## 2017-05-26 DIAGNOSIS — K5900 Constipation, unspecified: Secondary | ICD-10-CM | POA: Insufficient documentation

## 2017-05-26 DIAGNOSIS — R1012 Left upper quadrant pain: Secondary | ICD-10-CM | POA: Insufficient documentation

## 2017-05-26 DIAGNOSIS — Z79899 Other long term (current) drug therapy: Secondary | ICD-10-CM | POA: Insufficient documentation

## 2017-05-26 DIAGNOSIS — I10 Essential (primary) hypertension: Secondary | ICD-10-CM | POA: Insufficient documentation

## 2017-05-26 DIAGNOSIS — Z960 Presence of urogenital implants: Secondary | ICD-10-CM | POA: Insufficient documentation

## 2017-05-26 DIAGNOSIS — J45909 Unspecified asthma, uncomplicated: Secondary | ICD-10-CM | POA: Insufficient documentation

## 2017-05-26 DIAGNOSIS — Z7982 Long term (current) use of aspirin: Secondary | ICD-10-CM | POA: Insufficient documentation

## 2017-05-26 DIAGNOSIS — Z87891 Personal history of nicotine dependence: Secondary | ICD-10-CM | POA: Insufficient documentation

## 2017-05-26 LAB — URINALYSIS, COMPLETE (UACMP) WITH MICROSCOPIC
Bacteria, UA: NONE SEEN
SQUAMOUS EPITHELIAL / LPF: NONE SEEN
Specific Gravity, Urine: 1.022 (ref 1.005–1.030)

## 2017-05-26 LAB — CBC
HCT: 38.8 % — ABNORMAL LOW (ref 40.0–52.0)
Hemoglobin: 13.4 g/dL (ref 13.0–18.0)
MCH: 31.1 pg (ref 26.0–34.0)
MCHC: 34.5 g/dL (ref 32.0–36.0)
MCV: 90 fL (ref 80.0–100.0)
PLATELETS: 245 10*3/uL (ref 150–440)
RBC: 4.31 MIL/uL — AB (ref 4.40–5.90)
RDW: 13.3 % (ref 11.5–14.5)
WBC: 12.2 10*3/uL — AB (ref 3.8–10.6)

## 2017-05-26 LAB — BASIC METABOLIC PANEL
ANION GAP: 7 (ref 5–15)
BUN: 21 mg/dL — ABNORMAL HIGH (ref 6–20)
CO2: 28 mmol/L (ref 22–32)
Calcium: 9.1 mg/dL (ref 8.9–10.3)
Chloride: 102 mmol/L (ref 101–111)
Creatinine, Ser: 0.99 mg/dL (ref 0.61–1.24)
GLUCOSE: 117 mg/dL — AB (ref 65–99)
POTASSIUM: 4 mmol/L (ref 3.5–5.1)
SODIUM: 137 mmol/L (ref 135–145)

## 2017-05-26 MED ORDER — FENTANYL CITRATE (PF) 100 MCG/2ML IJ SOLN
50.0000 ug | Freq: Once | INTRAMUSCULAR | Status: AC
  Administered 2017-05-26: 50 ug via RESPIRATORY_TRACT
  Filled 2017-05-26: qty 2

## 2017-05-26 MED ORDER — ONDANSETRON 4 MG PO TBDP
4.0000 mg | ORAL_TABLET | Freq: Once | ORAL | Status: AC
  Administered 2017-05-26: 4 mg via ORAL
  Filled 2017-05-26: qty 1

## 2017-05-26 NOTE — ED Triage Notes (Signed)
Pt had cystoscopy on Friday with stent placement; pt was seen here Friday night with increased pain; prescribed oxycodone; that was working for pain until about 1 hour ago; pt unable to sit still in triage; moaning; pt says he's also now passing more blood in his urine

## 2017-05-27 ENCOUNTER — Emergency Department
Admission: EM | Admit: 2017-05-27 | Discharge: 2017-05-27 | Disposition: A | Discharge status: Home / Self Care | Payer: Self-pay | Attending: Emergency Medicine | Admitting: Emergency Medicine

## 2017-05-27 ENCOUNTER — Telehealth: Payer: Self-pay

## 2017-05-27 ENCOUNTER — Other Ambulatory Visit: Payer: Self-pay | Admitting: Radiology

## 2017-05-27 ENCOUNTER — Telehealth: Payer: Self-pay | Admitting: Radiology

## 2017-05-27 DIAGNOSIS — Z01818 Encounter for other preprocedural examination: Secondary | ICD-10-CM

## 2017-05-27 DIAGNOSIS — N289 Disorder of kidney and ureter, unspecified: Secondary | ICD-10-CM

## 2017-05-27 DIAGNOSIS — K5900 Constipation, unspecified: Secondary | ICD-10-CM

## 2017-05-27 DIAGNOSIS — R109 Unspecified abdominal pain: Secondary | ICD-10-CM

## 2017-05-27 MED ORDER — OXYCODONE-ACETAMINOPHEN 5-325 MG PO TABS
2.0000 | ORAL_TABLET | Freq: Once | ORAL | Status: AC
  Administered 2017-05-27: 2 via ORAL
  Filled 2017-05-27: qty 2

## 2017-05-27 MED ORDER — KETOROLAC TROMETHAMINE 60 MG/2ML IM SOLN
30.0000 mg | Freq: Once | INTRAMUSCULAR | Status: AC
  Administered 2017-05-27: 30 mg via INTRAMUSCULAR
  Filled 2017-05-27: qty 2

## 2017-05-27 MED ORDER — OXYCODONE HCL 15 MG PO TABS: 15.0000 mg | ORAL_TABLET | Freq: Four times a day (QID) | ORAL | 0 refills | Status: DC | PRN

## 2017-05-27 MED ORDER — DOCUSATE SODIUM 100 MG PO CAPS: 100.0000 mg | ORAL_CAPSULE | Freq: Every day | ORAL | 0 refills | Status: DC | PRN

## 2017-05-27 NOTE — Telephone Encounter (Signed)
Pt states he was in the ER twice over the weekend due to severe flank pain & constipation. Pt states he was given stronger pain medication, which has been helpful, however, states taking Myrbetriq hasn't helped with the discomfort. Please advise.

## 2017-05-27 NOTE — ED Notes (Signed)
Pt has intermittent abd/flank pain.  Pt dx with kidney stones and had stent placed last week.  Today pt took last percocet and continues to have pain.  Intermittent nausea.  No vomiting.  Pt alert.  Family with pt.

## 2017-05-27 NOTE — ED Notes (Signed)
Report off to April rn  

## 2017-05-27 NOTE — Telephone Encounter (Signed)
Notified pt of surgery scheduled with Dr Erlene Quan on 06/11/17 & to call day prior to surgery for arrival time to SDS. Advised pt to hold ASA 81mg  beginning on 06/04/17 & RTC for ucx prior to surgery. Pt voices understanding.

## 2017-05-27 NOTE — ED Provider Notes (Signed)
[facility] Emergency Department Provider Note  ____________________________________________   First MD Initiated Contact with Patient 05/27/17 0244     (approximate)  I have reviewed the triage vital signs and the nursing notes.   HISTORY  Chief Complaint Hematuria and Flank Pain   HPI Jeffrey Tucker is a 61 y.o. male with a history of hypertension and a recent left ureteral stent placement who is presenting to the emergency department today with left flank pain. He says that he does stent placed Friday morning and has had pain ever since. He says reported the emergency department Friday night and was given oxycodone which relieved his pain. Ever since he has had intermittent pain to the left flank which she says feels like someone is hitting a baseball bat into his back.  He also says that he has had flakes of blood in his urine and is unable to stool over the past 4 days. He says that he usually moves his bowels daily. Does not feel like he is only partially emptying his bladder. Has been using Dulcolax without relief of his constipation. Denies fever. Says that he is pain-free at this time except for pressure in his abdomen which she is attributing to his not being able to move his bowels.   Past Medical History:  Diagnosis Date  . Allergy    allergic rhinitis  . Asthma    mostly in childhood  . BPH (benign prostatic hypertrophy)   . Heart murmur   . History of kidney stones   . Hypertension   . Hypertriglyceridemia   . Hypothyroidism   . Thyroid disease    hypothyroidism    Patient Active Problem List   Diagnosis Date Noted  . Essential hypertension, benign 02/28/2017  . LLQ abdominal pain 11/20/2016  . Advance care planning 09/30/2016  . Cough 09/01/2016  . Mild intermittent asthma 08/21/2016  . Decreased libido 09/20/2015  . Hypertriglyceridemia   . Benign prostatic hyperplasia   . Ascending aorta dilatation (HCC) 08/21/2013  . Aortic  valve regurgitation 07/20/2011  . Bicuspid aortic valve 07/02/2011  . Encounter for screening examination for infectious disease 05/25/2011  . Mild intermittent asthma without complication 09/98/3382  . ALLERGIC RHINITIS 04/27/2009  . Hypothyroidism 04/14/2008    Past Surgical History:  Procedure Laterality Date  . CARDIAC CATHETERIZATION  2006   [place]  . CARDIOVASCULAR STRESS TEST  2006   [place]  . CHOLECYSTECTOMY  2006  . CYSTOSCOPY W/ URETERAL STENT PLACEMENT  05/24/2017   Procedure: left;  Surgeon: Nickie Retort, MD;  Location: [facility] ORS;  Service: Urology;;  . Consuela Mimes WITH STENT PLACEMENT Left 05/24/2017   Procedure: , cystoscopy,left ureteral stent placement and left ureteroscopy attempted, retrograde pylogram;  Surgeon: Nickie Retort, MD;  Location: [facility] ORS;  Service: Urology;  Laterality: Left;  . EYE SURGERY Bilateral    Cataract Extraction with IOL  . VEIN LIGATION AND STRIPPING Bilateral 1997    Prior to Admission medications   Medication Sig Start Date End Date Taking? Authorizing Provider  HYDROcodone-acetaminophen (NORCO) 5-325 MG tablet Take 1-2 tablets by mouth every 4 (four) hours as needed for moderate pain. 05/24/17  Yes Nickie Retort, MD  oxyCODONE (ROXICODONE) 15 MG immediate release tablet Take 1 tablet (15 mg total) by mouth every 4 (four) hours as needed for pain. 05/25/17  Yes Gregor Hams, MD  albuterol [facility] HFA) 108 (90 Base) MCG/ACT inhaler Inhale 2 puffs into the lungs 3 (three) times daily  as needed. Asthma flare up Patient taking differently: Inhale 2 puffs into the lungs 3 (three) times daily as needed for wheezing or shortness of breath. Asthma flare up 02/28/17   Viviana Simpler I, MD  amLODipine (NORVASC) 5 MG tablet Take 1 tablet (5 mg total) by mouth daily. 02/28/17   Venia Carbon, MD  aspirin 81 MG tablet Take 81 mg by mouth daily.    [provider]  cephALEXin (KEFLEX) 500 MG capsule Take 1 capsule (500 mg  total) by mouth 3 (three) times daily. 05/24/17   Nickie Retort, MD  cetirizine (ZYRTEC) 10 MG tablet Take 10 mg by mouth daily.    [provider]  Fluticasone-Salmeterol (ADVAIR) 100-50 MCG/DOSE AEPB Inhale 1 puff into the lungs daily as needed.    [provider]  ibuprofen (ADVIL,MOTRIN) 200 MG tablet Take 400 mg by mouth every 8 (eight) hours as needed for moderate pain.    [provider]  levothyroxine (SYNTHROID, LEVOTHROID) 125 MCG tablet Take 1 tablet (125 mcg total) by mouth daily. 09/28/16   Tonia Ghent, MD  lisinopril (PRINIVIL,ZESTRIL) 10 MG tablet Take 1 tablet (10 mg total) by mouth daily. 04/17/17   Venia Carbon, MD  Omega-3 Fatty Acids (EQL OMEGA 3 FISH OIL) 1200 MG CAPS Take 2 capsules by mouth daily.    [provider]    Allergies Tetracycline hcl and Amlodipine  Family History  Problem Relation Age of Onset  . Alcohol abuse Father   . Prostate cancer Father   . Alcohol abuse Mother   . Asthma Mother   . Liver cancer Paternal Grandmother   . Colon cancer Neg Hx   . Coronary artery disease Neg Hx   . Hypertension Neg Hx   . Diabetes Neg Hx   . Bladder Cancer Neg Hx   . Kidney cancer Neg Hx     Social History Social History  Substance Use Topics  . Smoking status: Former Smoker    Types: Cigarettes    Quit date: 11/26/1978  . Smokeless tobacco: Never Used     Comment: social smoked till 1980's  . Alcohol use No     Comment: none since 1984    Review of Systems  Constitutional: No fever/chills Eyes: No visual changes. ENT: No sore throat. Cardiovascular: Denies chest pain. Respiratory: Denies shortness of breath. Gastrointestinal:  No nausea, no vomiting.  No diarrhea.  Genitourinary: as above. Musculoskeletal: Negative for back pain. Skin: Negative for rash. Neurological: Negative for headaches, focal weakness or numbness.   ____________________________________________   PHYSICAL EXAM:  VITAL  SIGNS: ED Triage Vitals  Enc Vitals Group     BP 05/26/17 2312 (!) 155/60     Pulse Rate 05/26/17 2312 64     Resp 05/26/17 2312 18     Temp 05/26/17 2312 98.5 F (36.9 C)     Temp Source 05/26/17 2312 Oral     SpO2 05/26/17 2312 98 %     Weight 05/26/17 2313 158 lb (71.7 kg)     Height 05/26/17 2313 5\' 6"  (1.676 m)     Head Circumference --      Peak Flow --      Pain Score 05/26/17 2312 10     Pain Loc --      Pain Edu? --      Excl. in [place]? --     Constitutional: Alert and oriented. Well appearing and in no acute distress. Eyes: Conjunctivae are normal.  Head: Atraumatic. Nose: No congestion/rhinnorhea. Mouth/Throat: Mucous membranes are moist.  Neck: No stridor.   Cardiovascular: Normal rate, regular rhythm. Grossly normal heart sounds.   Respiratory: Normal respiratory effort.  No retractions. Lungs CTAB. Gastrointestinal: Soft and nontender. No distention. No CVA tenderness. Musculoskeletal: No lower extremity tenderness nor edema.  No joint effusions. Neurologic:  Normal speech and language. No gross focal neurologic deficits are appreciated. Skin:  Skin is warm, dry and intact. No rash noted. Psychiatric: Mood and affect are normal. Speech and behavior are normal.  ____________________________________________   LABS (all labs ordered are listed, but only abnormal results are displayed)  Labs Reviewed  URINALYSIS, COMPLETE (UACMP) WITH MICROSCOPIC - Abnormal; Notable for the following:       Result Value   Color, Urine RED (*)    APPearance CLOUDY (*)    Glucose, UA   (*)    Value: TEST NOT REPORTED DUE TO COLOR INTERFERENCE OF URINE PIGMENT   Hgb urine dipstick   (*)    Value: TEST NOT REPORTED DUE TO COLOR INTERFERENCE OF URINE PIGMENT   Bilirubin Urine   (*)    Value: TEST NOT REPORTED DUE TO COLOR INTERFERENCE OF URINE PIGMENT   Ketones, ur   (*)    Value: TEST NOT REPORTED DUE TO COLOR INTERFERENCE OF URINE PIGMENT   Protein, ur   (*)    Value: TEST NOT  REPORTED DUE TO COLOR INTERFERENCE OF URINE PIGMENT   Nitrite   (*)    Value: TEST NOT REPORTED DUE TO COLOR INTERFERENCE OF URINE PIGMENT   Leukocytes, UA   (*)    Value: TEST NOT REPORTED DUE TO COLOR INTERFERENCE OF URINE PIGMENT   All other components within normal limits  CBC - Abnormal; Notable for the following:    WBC 12.2 (*)    RBC 4.31 (*)    HCT 38.8 (*)    All other components within normal limits  BASIC METABOLIC PANEL - Abnormal; Notable for the following:    Glucose, Bld 117 (*)    BUN 21 (*)    All other components within normal limits   ____________________________________________  EKG   ____________________________________________  RADIOLOGY   ____________________________________________   PROCEDURES  Procedure(s) performed:   Procedures  Critical Care performed:   ____________________________________________   INITIAL IMPRESSION / ASSESSMENT AND PLAN / ED COURSE  Pertinent labs & imaging results that were available during my care of the patient were reviewed by me and considered in my medical decision making (see chart for details).   ----------------------------------------- 4:15 AM on 05/27/2017 -----------------------------------------  I discussed the case with Dr. Yong Channel of urology who believes that the patient is having pain from the stent itself. He recommends and says his ibuprofen. I discussed this with the patient says that ibuprofen has not been working for him although he has only had Norco and oxycodone since the procedure. He'll begin taking ibuprofen, 600 mg every 6 hours as needed over the next 3 days. I will also give him several more tabs of the oxycodone 15 mg. He'll be calling Dr. Pilar Jarvis, his urologist in the morning. We will also change in the Colace and I told him that he may try other laxatives or an enema over-the-counter. He is understanding of the plan and willing to comply. Continues to be pain-free.      ____________________________________________   FINAL CLINICAL IMPRESSION(S) / ED DIAGNOSES  Left flank pain. Constipation.    NEW MEDICATIONS STARTED DURING THIS VISIT:  New Prescriptions   No medications on file     Note:  This document was prepared using Dragon voice recognition software and may include unintentional dictation errors.     Orbie Pyo, MD 05/27/17 [phone]

## 2017-05-27 NOTE — Telephone Encounter (Signed)
Nickie Retort, MD  Charlean Carneal, Rosanne Ashing, RN Caller: Unspecified (3 days ago, 3:48 PM)  We can try calling in ditropan 5 mg tid prn (30 pills, no refills) and stop the myrbetriq. This may make his constipation worse though.    Pt states he does not want to fill the ditropan at this time as the pain medication he was given at the ER is helping. Advised pt to call back if needed. Pt voices understanding.

## 2017-05-27 NOTE — Telephone Encounter (Signed)
-----   Message from Nickie Retort, MD sent at 05/24/2017  8:27 AM EDT ----- Patient needs repeat cysto, left URS, left renal pelvis tumor biopsy, possible laser ablation of tumor, laser lithotripsy, left ureteral stent exchange in 2 weeks. Thanks.

## 2017-05-27 NOTE — Telephone Encounter (Signed)
Pt said his pain has subsided. He is still trying to have his 1st BM.

## 2017-05-28 ENCOUNTER — Emergency Department (HOSPITAL_COMMUNITY)
Admission: EM | Admit: 2017-05-28 | Discharge: 2017-05-29 | Disposition: A | Discharge status: Home / Self Care | Payer: Self-pay | Attending: Emergency Medicine | Admitting: Emergency Medicine

## 2017-05-28 ENCOUNTER — Encounter (HOSPITAL_COMMUNITY): Payer: Self-pay

## 2017-05-28 ENCOUNTER — Emergency Department (HOSPITAL_COMMUNITY): Payer: Self-pay

## 2017-05-28 ENCOUNTER — Telehealth: Payer: Self-pay

## 2017-05-28 DIAGNOSIS — T40695A Adverse effect of other narcotics, initial encounter: Secondary | ICD-10-CM | POA: Insufficient documentation

## 2017-05-28 DIAGNOSIS — R1084 Generalized abdominal pain: Secondary | ICD-10-CM | POA: Insufficient documentation

## 2017-05-28 DIAGNOSIS — I10 Essential (primary) hypertension: Secondary | ICD-10-CM | POA: Insufficient documentation

## 2017-05-28 DIAGNOSIS — J45909 Unspecified asthma, uncomplicated: Secondary | ICD-10-CM | POA: Insufficient documentation

## 2017-05-28 DIAGNOSIS — Z7982 Long term (current) use of aspirin: Secondary | ICD-10-CM | POA: Insufficient documentation

## 2017-05-28 DIAGNOSIS — R110 Nausea: Secondary | ICD-10-CM | POA: Insufficient documentation

## 2017-05-28 DIAGNOSIS — Z87891 Personal history of nicotine dependence: Secondary | ICD-10-CM | POA: Insufficient documentation

## 2017-05-28 DIAGNOSIS — K5903 Drug induced constipation: Secondary | ICD-10-CM | POA: Insufficient documentation

## 2017-05-28 DIAGNOSIS — E039 Hypothyroidism, unspecified: Secondary | ICD-10-CM | POA: Insufficient documentation

## 2017-05-28 DIAGNOSIS — R011 Cardiac murmur, unspecified: Secondary | ICD-10-CM | POA: Insufficient documentation

## 2017-05-28 DIAGNOSIS — T8384XA Pain from genitourinary prosthetic devices, implants and grafts, initial encounter: Secondary | ICD-10-CM

## 2017-05-28 DIAGNOSIS — Z79899 Other long term (current) drug therapy: Secondary | ICD-10-CM | POA: Insufficient documentation

## 2017-05-28 LAB — COMPREHENSIVE METABOLIC PANEL
ALBUMIN: 3.8 g/dL (ref 3.5–5.0)
ALK PHOS: 91 U/L (ref 38–126)
ALT: 21 U/L (ref 17–63)
AST: 24 U/L (ref 15–41)
Anion gap: 10 (ref 5–15)
BILIRUBIN TOTAL: 1.2 mg/dL (ref 0.3–1.2)
BUN: 19 mg/dL (ref 6–20)
CALCIUM: 8.9 mg/dL (ref 8.9–10.3)
CO2: 26 mmol/L (ref 22–32)
CREATININE: 1.16 mg/dL (ref 0.61–1.24)
Chloride: 100 mmol/L — ABNORMAL LOW (ref 101–111)
GFR calc Af Amer: >60 mL/min (ref >60)
GLUCOSE: 123 mg/dL — AB (ref 65–99)
POTASSIUM: 3.9 mmol/L (ref 3.5–5.1)
Sodium: 136 mmol/L (ref 135–145)
TOTAL PROTEIN: 7.1 g/dL (ref 6.5–8.1)

## 2017-05-28 LAB — CBC
HEMATOCRIT: 39.5 % (ref 39.0–52.0)
Hemoglobin: 13.2 g/dL (ref 13.0–17.0)
MCH: 30.6 pg (ref 26.0–34.0)
MCHC: 33.4 g/dL (ref 30.0–36.0)
MCV: 91.4 fL (ref 78.0–100.0)
PLATELETS: 246 10*3/uL (ref 150–400)
RBC: 4.32 MIL/uL (ref 4.22–5.81)
RDW: 13.2 % (ref 11.5–15.5)
WBC: 11.3 10*3/uL — ABNORMAL HIGH (ref 4.0–10.5)

## 2017-05-28 MED ORDER — SODIUM CHLORIDE 0.9 % IV BOLUS (SEPSIS)
1000.0000 mL | Freq: Once | INTRAVENOUS | Status: AC
  Administered 2017-05-29: 1000 mL via INTRAVENOUS

## 2017-05-28 MED ORDER — OXYBUTYNIN CHLORIDE 5 MG PO TABS: 5.0000 mg | ORAL_TABLET | Freq: Three times a day (TID) | ORAL | 0 refills | Status: DC

## 2017-05-28 MED ORDER — KETOROLAC TROMETHAMINE 15 MG/ML IJ SOLN
15.0000 mg | Freq: Once | INTRAMUSCULAR | Status: AC
  Administered 2017-05-29: 15 mg via INTRAVENOUS
  Filled 2017-05-28: qty 1

## 2017-05-28 MED ORDER — SODIUM CHLORIDE 0.9 % IV BOLUS (SEPSIS)
1000.0000 mL | Freq: Once | INTRAVENOUS | Status: AC
  Administered 2017-05-28: 1000 mL via INTRAVENOUS

## 2017-05-28 MED ORDER — TAMSULOSIN HCL 0.4 MG PO CAPS: 0.4000 mg | ORAL_CAPSULE | Freq: Every day | ORAL | 0 refills | Status: DC

## 2017-05-28 MED ORDER — ONDANSETRON HCL 4 MG/2ML IJ SOLN
4.0000 mg | Freq: Once | INTRAMUSCULAR | Status: AC
  Administered 2017-05-28: 4 mg via INTRAVENOUS
  Filled 2017-05-28: qty 2

## 2017-05-28 MED ORDER — SORBITOL 70 % SOLN
960.0000 mL | TOPICAL_OIL | Freq: Once | ORAL | Status: AC
  Administered 2017-05-29: 960 mL via RECTAL
  Filled 2017-05-28: qty 240

## 2017-05-28 MED ORDER — IOPAMIDOL (ISOVUE-300) INJECTION 61%
INTRAVENOUS | Status: AC
  Administered 2017-05-28: 100 mL
  Filled 2017-05-28: qty 100

## 2017-05-28 MED ORDER — MORPHINE SULFATE (PF) 4 MG/ML IV SOLN
6.0000 mg | Freq: Once | INTRAVENOUS | Status: DC
  Filled 2017-05-28: qty 2

## 2017-05-28 NOTE — Telephone Encounter (Signed)
Spoke with patient in regards to his pain and discomfort. Patient has been back to the ER and given oxycodone which is helping but he is almost out of pain medication and wants to know what can be done. It was explained to patient that the symptoms he is having are due to the stent in place and we could send in a script for oxybutinin and flomax to help relieve stent discomfort. Patient also states he has not had a bowel movement in over a week. He is taking the colace but he was instructed he would need to take a laxative to help relieve the constipation since the colace was not helping. He was told to try OTC Doculax or Mag Citrate. Scripts were sent to patient's pharm he was instructed to try and use these medications and OTC NSAIDs to help with his pain and discomfort.

## 2017-05-28 NOTE — Telephone Encounter (Addendum)
Patient called back stating magnesium citrate that was prescribed to him earlier is not working. Pt states he drank the whole bottle of magnesium citrate around the same time as he took the other meds but later. Advised pt to continue to drink water and give the magnesium citrate more time to work. Patient verbalized understanding.

## 2017-05-28 NOTE — ED Notes (Signed)
Pt informed of the need of a urine sample.

## 2017-05-28 NOTE — ED Notes (Signed)
Patient transported to CT 

## 2017-05-28 NOTE — Treatment Plan (Signed)
Patient discussed with ED provider. 61 yo s/p left ureteral stent placement 6/29, has presented x3 to multiple EDs for left flank pain. Significantly constipated today, no BM in >6 day. Ongoing pain. VSS. Labs wnl, Cr slightly elevated but has been vomiting. UA pending. CT scan obtained, demonstrated stent in appropriate position, with mild hydro. Bladder moderately full.   Some hydro expected with ureteral stent in place due to urine refluxing into kidney. May explain stranding and delayed contrast as well. Very unlikely stent has obstructed in such a small amount of time. Ensure complete bladder emptying. Follow up UA, recommend sending urine culture to ensure lack of infection regardless of UA, +/- abx depending on UA findings. Agree with aggressive treatment of constipation. Recommend scheduled ibpuprofen, flomax, oxybutinin 5mg  TID with bowel regimen, and oxycodone PRN. Toradol while in ED. Scheduled for ureteroscopy 7/17, unfortunately until then stent needs to stay in place and symptoms managed.   Jonna Clark, MD Urologic Surgery Resident, PGY4

## 2017-05-28 NOTE — ED Provider Notes (Signed)
[facility] DEPT Provider Note   CSN: [id number] Arrival date & time: 05/28/17  8416     History   Chief Complaint Chief Complaint  Patient presents with  . Post-op Problem  . Constipation  . Urinary Retention    HPI Jeffrey Tucker is a 61 y.o. male.  HPI 61 year old male here with worsening left flank pain. Patient just underwent ureteroscopy and stent placement with Dr. Manfred Arch. He had a J point stent placed at that time. Since then, he has had persistently worsening left flank pain and abdominal pain. He has been seen at multiple ERs for this pain. He states his pain is initially aching and throbbing and now progressively more frequent and severe. He has associated nausea and decreased appetite. He is also had worsening constipation and has not had a bowel movement since the procedure 5 days ago. He has been taking stool softeners as well as magnesium citrate. He was seen at [facility] twice for these symptoms and was sent home. No fevers. He has had decreased urinary output.  Past Medical History:  Diagnosis Date  . Allergy    allergic rhinitis  . Asthma    mostly in childhood  . BPH (benign prostatic hypertrophy)   . Heart murmur   . History of kidney stones   . Hypertension   . Hypertriglyceridemia   . Hypothyroidism   . Thyroid disease    hypothyroidism    Patient Active Problem List   Diagnosis Date Noted  . Essential hypertension, benign 02/28/2017  . LLQ abdominal pain 11/20/2016  . Advance care planning 09/30/2016  . Cough 09/01/2016  . Mild intermittent asthma 08/21/2016  . Decreased libido 09/20/2015  . Hypertriglyceridemia   . Benign prostatic hyperplasia   . Ascending aorta dilatation (HCC) 08/21/2013  . Aortic valve regurgitation 07/20/2011  . Bicuspid aortic valve 07/02/2011  . Encounter for screening examination for infectious disease 05/25/2011  . Mild intermittent asthma without complication 60/63/0160  . ALLERGIC RHINITIS 04/27/2009  .  Hypothyroidism 04/14/2008    Past Surgical History:  Procedure Laterality Date  . CARDIAC CATHETERIZATION  2006   [place]  . CARDIOVASCULAR STRESS TEST  2006   [place]  . CHOLECYSTECTOMY  2006  . CYSTOSCOPY W/ URETERAL STENT PLACEMENT  05/24/2017   Procedure: left;  Surgeon: Nickie Retort, MD;  Location: [facility] ORS;  Service: Urology;;  . Consuela Mimes WITH STENT PLACEMENT Left 05/24/2017   Procedure: , cystoscopy,left ureteral stent placement and left ureteroscopy attempted, retrograde pylogram;  Surgeon: Nickie Retort, MD;  Location: [facility] ORS;  Service: Urology;  Laterality: Left;  . EYE SURGERY Bilateral    Cataract Extraction with IOL  . VEIN LIGATION AND STRIPPING Bilateral 1997       Home Medications    Prior to Admission medications   Medication Sig Start Date End Date Taking? Authorizing Provider  acetaminophen (TYLENOL) 500 MG tablet Take 1,000 mg by mouth every 6 (six) hours as needed for mild pain.   Yes [provider]  albuterol (PROAIR HFA) 108 (90 Base) MCG/ACT inhaler Inhale 2 puffs into the lungs 3 (three) times daily as needed. Asthma flare up Patient taking differently: Inhale 2 puffs into the lungs 3 (three) times daily as needed for wheezing or shortness of breath. Asthma flare up 02/28/17  Yes Viviana Simpler I, MD  amLODipine (NORVASC) 5 MG tablet Take 1 tablet (5 mg total) by mouth daily. 02/28/17  Yes Venia Carbon, MD  aspirin 81 MG tablet Take 81 mg  by mouth daily.   Yes [provider]  cetirizine (ZYRTEC) 10 MG tablet Take 10 mg by mouth daily.   Yes [provider]  Fluticasone-Salmeterol (ADVAIR) 100-50 MCG/DOSE AEPB Inhale 1 puff into the lungs daily as needed (when symptomatic).    Yes [provider]  HYDROcodone-acetaminophen (NORCO) 5-325 MG tablet Take 1-2 tablets by mouth every 4 (four) hours as needed for moderate pain. 05/24/17  Yes Nickie Retort, MD  levothyroxine (SYNTHROID, LEVOTHROID) 125 MCG  tablet Take 1 tablet (125 mcg total) by mouth daily. 09/28/16  Yes Tonia Ghent, MD  lisinopril (PRINIVIL,ZESTRIL) 10 MG tablet Take 1 tablet (10 mg total) by mouth daily. 04/17/17  Yes Venia Carbon, MD  magnesium citrate SOLN Take 1 Bottle by mouth once as needed for mild constipation or moderate constipation.    Yes [provider]  mirabegron ER (MYRBETRIQ) 50 MG TB24 tablet Take 50 mg by mouth daily.   Yes [provider]  Omega-3 Fatty Acids (EQL OMEGA 3 FISH OIL) 1200 MG CAPS Take 2 capsules by mouth daily.   Yes [provider]  oxybutynin (DITROPAN) 5 MG tablet Take 1 tablet (5 mg total) by mouth 3 (three) times daily. 05/28/17  Yes Nickie Retort, MD  oxyCODONE (ROXICODONE) 15 MG immediate release tablet Take 1 tablet (15 mg total) by mouth every 6 (six) hours as needed for pain. 05/27/17  Yes Schaevitz, Randall An, MD  senna-docusate (DOK PLUS) 8.6-50 MG tablet Take 2 tablets by mouth 2 (two) times daily as needed for mild constipation.   Yes [provider]  tamsulosin (FLOMAX) 0.4 MG CAPS capsule Take 1 capsule (0.4 mg total) by mouth daily. 05/28/17  Yes Nickie Retort, MD  cephALEXin (KEFLEX) 500 MG capsule Take 1 capsule (500 mg total) by mouth 3 (three) times daily. 05/29/17 06/05/17  Duffy Bruce, MD  docusate sodium (ENEMEEZ) 283 MG enema Place 1 enema (283 mg total) rectally daily as needed for severe constipation. 05/29/17   Duffy Bruce, MD  polyethylene glycol [facility]) packet Take 17 g by mouth 3 (three) times daily as needed. Take up to three times daily until bowels are soft/watery, then stop. Take once a day to help prevent constipation. 05/29/17   Duffy Bruce, MD    Family History Family History  Problem Relation Age of Onset  . Alcohol abuse Father   . Prostate cancer Father   . Alcohol abuse Mother   . Asthma Mother   . Liver cancer Paternal Grandmother   . Colon cancer Neg Hx   . Coronary artery disease Neg Hx     . Hypertension Neg Hx   . Diabetes Neg Hx   . Bladder Cancer Neg Hx   . Kidney cancer Neg Hx     Social History Social History  Substance Use Topics  . Smoking status: Former Smoker    Types: Cigarettes    Quit date: 11/26/1978  . Smokeless tobacco: Never Used     Comment: social smoked till 1980's  . Alcohol use No     Comment: none since 1984     Allergies   Tetracycline hcl and Amlodipine   Review of Systems Review of Systems  Constitutional: Positive for fatigue. Negative for chills and fever.  HENT: Negative for congestion and rhinorrhea.   Eyes: Negative for visual disturbance.  Respiratory: Negative for cough, shortness of breath and wheezing.   Cardiovascular: Negative for chest pain and leg swelling.  Gastrointestinal: Positive for  abdominal pain, constipation, nausea and vomiting. Negative for diarrhea.  Genitourinary: Positive for flank pain and frequency. Negative for dysuria.  Musculoskeletal: Negative for neck pain and neck stiffness.  Skin: Negative for rash and wound.  Allergic/Immunologic: Negative for immunocompromised state.  Neurological: Negative for syncope, weakness and headaches.  All other systems reviewed and are negative.    Physical Exam Updated Vital Signs BP (!) 151/54   Pulse 60   Temp 98.2 F (36.8 C) (Oral)   Resp 18   Ht 5\' 6"  (1.676 m)   Wt 71.7 kg (158 lb)   SpO2 98%   BMI 25.50 kg/m   Physical Exam  Constitutional: He is oriented to person, place, and time. He appears well-developed and well-nourished. No distress.  HENT:  Head: Normocephalic and atraumatic.  Mouth/Throat: Oropharynx is clear and moist.  Eyes: Conjunctivae are normal.  Neck: Neck supple.  Cardiovascular: Normal rate, regular rhythm and normal heart sounds.  Exam reveals no friction rub.   No murmur heard. Pulmonary/Chest: Effort normal and breath sounds normal. No respiratory distress. He has no wheezes. He has no rales.  Abdominal: Soft. Normal  appearance. He exhibits distension. Bowel sounds are increased. There is generalized tenderness. There is no CVA tenderness.  Musculoskeletal: He exhibits no edema.  Neurological: He is alert and oriented to person, place, and time. He exhibits normal muscle tone.  Skin: Skin is warm. Capillary refill takes less than 2 seconds.  Psychiatric: He has a normal mood and affect.  Nursing note and vitals reviewed.    ED Treatments / Results  Labs (all labs ordered are listed, but only abnormal results are displayed) Labs Reviewed  COMPREHENSIVE METABOLIC PANEL - Abnormal; Notable for the following:       Result Value   Chloride 100 (*)    Glucose, Bld 123 (*)    All other components within normal limits  CBC - Abnormal; Notable for the following:    WBC 11.3 (*)    All other components within normal limits  URINALYSIS, ROUTINE W REFLEX MICROSCOPIC - Abnormal; Notable for the following:    Color, Urine AMBER (*)    APPearance CLOUDY (*)    Specific Gravity, Urine >1.046 (*)    Hgb urine dipstick LARGE (*)    Ketones, ur 5 (*)    Protein, ur 100 (*)    Leukocytes, UA MODERATE (*)    Bacteria, UA FEW (*)    Squamous Epithelial / LPF 0-5 (*)    All other components within normal limits  URINE CULTURE    EKG  EKG Interpretation None       Radiology Ct Abdomen Pelvis W Contrast  Result Date: 05/28/2017 CLINICAL DATA:  61 year old male with abdominal and pelvic pain with constipation. Left ureteral stent placed on 05/24/2017. EXAM: CT ABDOMEN AND PELVIS WITH CONTRAST TECHNIQUE: Multidetector CT imaging of the abdomen and pelvis was performed using the standard protocol following bolus administration of intravenous contrast. CONTRAST:  158mL ISOVUE-300 IOPAMIDOL (ISOVUE-300) INJECTION 61% COMPARISON:  05/02/2017 CT FINDINGS: Lower chest: Mild bibasilar atelectasis noted. Hepatobiliary: The liver is unremarkable. The patient is status post cholecystectomy. No biliary dilatation.  Pancreas: Unremarkable Spleen: Unremarkable Adrenals/Urinary Tract: There has been interval placement of a left ureteral stent with tips in the left renal pelvis and bladder. Mild left hydronephrosis, perinephric and periureteral inflammation now noted. On delayed images, only a small amount of contrast is noted in the left renal collecting system. Nonobstructing left renal calculi are again noted. No  ureteral calculi along the urinary stent identified. The right kidney and adrenal glands are unremarkable. Stomach/Bowel: Fluid-filled colon and small bowel noted. No bowel wall thickening identified. The appendix is normal. There is no evidence of bowel obstruction. Vascular/Lymphatic: Aortic atherosclerosis. No enlarged abdominal or pelvic lymph nodes. Reproductive: Prostate is unremarkable. Other: No free fluid, abscess or pneumoperitoneum. Musculoskeletal: No acute abnormality or suspicious bony lesion. A right iliac bone island again noted. IMPRESSION: Interval placement of left ureteral stent with new mild left hydronephrosis and perinephric/ periureteral inflammation and little contrast identified within the left renal collecting system on delayed images - compatible with stent/ureteral obstruction. Nonobstructing left renal calculi again noted. Fluid within scattered colon and small bowel which may represent a diarrheal state. No bowel wall thickening or bowel obstruction. Aortic Atherosclerosis (ICD10-I70.0). Electronically Signed   By: Margarette Canada M.D.   On: 05/28/2017 21:52    Procedures Procedures (including critical care time)  Medications Ordered in ED Medications  ondansetron (ZOFRAN) injection 4 mg (4 mg Intravenous Given 05/28/17 2024)  sodium chloride 0.9 % bolus 1,000 mL (0 mLs Intravenous Stopped 05/29/17 0008)  iopamidol (ISOVUE-300) 61 % injection (100 mLs  Contrast Given 05/28/17 2124)  ketorolac (TORADOL) 15 MG/ML injection 15 mg (15 mg Intravenous Given 05/29/17 0022)  sodium chloride 0.9 %  bolus 1,000 mL (0 mLs Intravenous Stopped 05/29/17 0231)  sorbitol, milk of mag, mineral oil, glycerin (SMOG) enema (960 mLs Rectal Given 05/29/17 0112)     Initial Impression / Assessment and Plan / ED Course  I have reviewed the triage vital signs and the nursing notes.  Pertinent labs & imaging results that were available during my care of the patient were reviewed by me and considered in my medical decision making (see chart for details).    61 year old male with past medical history as above here with worsening abdominal pain and distention as well as intermittent left flank pain in setting of recent ureteral stent placement. Regarding splinting, this is likely secondary to ureteral spasm from stent placement. No evidence to suggest infected stent or stone. He has a mild leukocytosis which is trending down from his last value. CT scan obtained and shows possible obstruction but I discussed this with urology, who does not feel this is likely. The recommend continued supportive care and pain meds. I suspect the worsening of his pain is likely secondary to moderate constipation, complicated by magnesium citrate prior to arrival with subsequent significant spasms. Patient given enema here with improvement. Pt also with elevated PVR but d/w Urology, who recommends holding on foley, tx urine, and decreasing frequency of oxybutynin. Pt in agreement. D/c home with bowel regimen, pain control, and keflex as well for pyuria though no CVAT, no signs of pyelo or systemic infection.  Final Clinical Impressions(s) / ED Diagnoses   Final diagnoses:  Drug-induced constipation  Pain due to ureteral stent, initial encounter [facility])    New Prescriptions Discharge Medication List as of 05/29/2017 12:37 AM       Duffy Bruce, MD 05/29/17 1113

## 2017-05-28 NOTE — Telephone Encounter (Signed)
Patient called back in with questions about taking Oxybutinin and  Flomax. He stated the meds are not working. Pt started taking meds today at 11 a.m. Advised pt to give meds time to work. Pt c/o pain being unbearable. Advised pt to go to ED. Pt refused. Advised pt he would have some pain and discomfort.

## 2017-05-28 NOTE — ED Triage Notes (Signed)
Pt was to have procedure last Friday to get rid of kidney stone. They placed a stent and pt has continued to have persistent abd pain. He has had constipation and has not urinated all day. Pt appears to be in significant pain.

## 2017-05-29 LAB — URINALYSIS, ROUTINE W REFLEX MICROSCOPIC
Bilirubin Urine: NEGATIVE
Glucose, UA: NEGATIVE mg/dL
Ketones, ur: 5 mg/dL — AB
Nitrite: NEGATIVE
PROTEIN: 100 mg/dL — AB
pH: 6 (ref 5.0–8.0)

## 2017-05-29 MED ORDER — CIPROFLOXACIN HCL 500 MG PO TABS: 500.0000 mg | ORAL_TABLET | Freq: Two times a day (BID) | ORAL | 0 refills | Status: DC

## 2017-05-29 MED ORDER — DOCUSATE SODIUM 283 MG RE ENEM: 1.0000 | ENEMA | Freq: Every day | RECTAL | 0 refills | Status: DC | PRN

## 2017-05-29 MED ORDER — POLYETHYLENE GLYCOL 3350 17 G PO PACK: 17.0000 g | PACK | Freq: Three times a day (TID) | ORAL | 0 refills | Status: DC | PRN

## 2017-05-29 MED ORDER — CEPHALEXIN 500 MG PO CAPS: 500.0000 mg | ORAL_CAPSULE | Freq: Three times a day (TID) | ORAL | 0 refills | Status: AC

## 2017-05-29 NOTE — Discharge Instructions (Signed)
FOR YOUR CONSTIPATION: - Continue taking Colace twice a day while taking any pain medications - Use Miralax twice a day or up to three times a day if constipated (no BM for 2-3 days) - If you are unable to have a bowel movement despite this, use an enema  FOR YOUR PAIN: - Continue your Oxycodone and Ibuprofen - Decrease your Oxybutynin from three times a day to once a day or twice a day; this will help with urinary retention  FOR YOUR FLANK/SIDE PAIN: - We will prescribe keflex antibiotic. Take this as prescribed as there is a small amount of inflammation in your urine.

## 2017-05-30 ENCOUNTER — Telehealth: Payer: Self-pay | Admitting: Urology

## 2017-05-30 LAB — URINE CULTURE: CULTURE: NO GROWTH

## 2017-05-30 NOTE — Telephone Encounter (Signed)
Pt was in ER on 7/3.  They did urinalysis and culture.  He is scheduled to come in for labs on Monday 7/9.  Please call and let him know if necessary to keep lab appt.

## 2017-05-31 ENCOUNTER — Telehealth: Payer: Self-pay

## 2017-05-31 NOTE — Telephone Encounter (Signed)
Spoke to pt about ER visit from 05-28-17. He said he believes the surgeon did not think he was going to be in as much pain with his procedure as he has had. He said he found out the hard way that the pain meds cause terrible constipation. He said [facility] really did a good job with helping him get cleaned out. He has not had pain since he left [facility] ER and feel great.

## 2017-05-31 NOTE — Telephone Encounter (Signed)
Patient was notified that culture from ER was negative and he does not need to drop off another urine on 7-9 for culture

## 2017-06-03 ENCOUNTER — Other Ambulatory Visit: Payer: Self-pay

## 2017-06-10 MED ORDER — CEFAZOLIN SODIUM-DEXTROSE 2-4 GM/100ML-% IV SOLN
2.0000 g | INTRAVENOUS | Status: AC
  Administered 2017-06-11: 5 g via INTRAVENOUS

## 2017-06-11 ENCOUNTER — Encounter
Admission: RE | Disposition: A | Discharge status: Home / Self Care | Payer: Self-pay | Source: Ambulatory Visit | Attending: Urology

## 2017-06-11 ENCOUNTER — Ambulatory Visit: Payer: Self-pay | Admitting: Anesthesiology

## 2017-06-11 ENCOUNTER — Encounter: Payer: Self-pay | Admitting: *Deleted

## 2017-06-11 ENCOUNTER — Ambulatory Visit
Admission: RE | Admit: 2017-06-11 | Discharge: 2017-06-11 | Disposition: A | Discharge status: Home / Self Care | Payer: Self-pay | Source: Ambulatory Visit | Attending: Urology | Admitting: Urology

## 2017-06-11 DIAGNOSIS — E781 Pure hyperglyceridemia: Secondary | ICD-10-CM | POA: Insufficient documentation

## 2017-06-11 DIAGNOSIS — I739 Peripheral vascular disease, unspecified: Secondary | ICD-10-CM | POA: Insufficient documentation

## 2017-06-11 DIAGNOSIS — N400 Enlarged prostate without lower urinary tract symptoms: Secondary | ICD-10-CM | POA: Insufficient documentation

## 2017-06-11 DIAGNOSIS — Z87891 Personal history of nicotine dependence: Secondary | ICD-10-CM | POA: Insufficient documentation

## 2017-06-11 DIAGNOSIS — N200 Calculus of kidney: Secondary | ICD-10-CM

## 2017-06-11 DIAGNOSIS — E039 Hypothyroidism, unspecified: Secondary | ICD-10-CM | POA: Insufficient documentation

## 2017-06-11 DIAGNOSIS — Z79899 Other long term (current) drug therapy: Secondary | ICD-10-CM | POA: Insufficient documentation

## 2017-06-11 DIAGNOSIS — D4112 Neoplasm of uncertain behavior of left renal pelvis: Secondary | ICD-10-CM

## 2017-06-11 DIAGNOSIS — J45909 Unspecified asthma, uncomplicated: Secondary | ICD-10-CM | POA: Insufficient documentation

## 2017-06-11 DIAGNOSIS — N289 Disorder of kidney and ureter, unspecified: Secondary | ICD-10-CM

## 2017-06-11 DIAGNOSIS — I10 Essential (primary) hypertension: Secondary | ICD-10-CM | POA: Insufficient documentation

## 2017-06-11 DIAGNOSIS — Z7982 Long term (current) use of aspirin: Secondary | ICD-10-CM | POA: Insufficient documentation

## 2017-06-11 HISTORY — PX: CYSTOSCOPY W/ URETERAL STENT PLACEMENT: SHX1429

## 2017-06-11 HISTORY — PX: CYSTOSCOPY WITH BIOPSY: SHX5122

## 2017-06-11 HISTORY — PX: URETEROSCOPY WITH HOLMIUM LASER LITHOTRIPSY: SHX6645

## 2017-06-11 SURGERY — URETEROSCOPY, WITH LITHOTRIPSY USING HOLMIUM LASER
Anesthesia: General | Site: Ureter | Laterality: Left | Wound class: Clean Contaminated

## 2017-06-11 MED ORDER — FAMOTIDINE 20 MG PO TABS
ORAL_TABLET | ORAL | Status: AC
  Filled 2017-06-11: qty 1

## 2017-06-11 MED ORDER — SUGAMMADEX SODIUM 200 MG/2ML IV SOLN
INTRAVENOUS | Status: DC | PRN
  Administered 2017-06-11: 141.6 mg via INTRAVENOUS

## 2017-06-11 MED ORDER — FENTANYL CITRATE (PF) 100 MCG/2ML IJ SOLN
INTRAMUSCULAR | Status: AC
  Filled 2017-06-11: qty 2

## 2017-06-11 MED ORDER — OXYCODONE HCL 5 MG PO TABS: 5.0000 mg | ORAL_TABLET | ORAL | 0 refills | Status: DC | PRN

## 2017-06-11 MED ORDER — ONDANSETRON HCL 4 MG/2ML IJ SOLN
INTRAMUSCULAR | Status: AC
  Administered 2017-06-11: 4 mg
  Filled 2017-06-11: qty 2

## 2017-06-11 MED ORDER — LACTATED RINGERS IV SOLN
INTRAVENOUS | Status: DC
  Administered 2017-06-11 (×2): via INTRAVENOUS

## 2017-06-11 MED ORDER — MIDAZOLAM HCL 2 MG/2ML IJ SOLN
INTRAMUSCULAR | Status: AC
  Filled 2017-06-11: qty 2

## 2017-06-11 MED ORDER — PROMETHAZINE HCL 25 MG/ML IJ SOLN
INTRAMUSCULAR | Status: AC
  Administered 2017-06-11: 6.25 mg via INTRAVENOUS
  Filled 2017-06-11: qty 1

## 2017-06-11 MED ORDER — OXYCODONE HCL 5 MG PO TABS
5.0000 mg | ORAL_TABLET | ORAL | Status: DC | PRN
  Administered 2017-06-11: 5 mg via ORAL

## 2017-06-11 MED ORDER — FENTANYL CITRATE (PF) 100 MCG/2ML IJ SOLN
INTRAMUSCULAR | Status: DC | PRN
  Administered 2017-06-11 (×2): 50 ug via INTRAVENOUS

## 2017-06-11 MED ORDER — IOTHALAMATE MEGLUMINE 43 % IV SOLN
INTRAVENOUS | Status: DC | PRN
  Administered 2017-06-11: 15 mL via URETHRAL

## 2017-06-11 MED ORDER — SUGAMMADEX SODIUM 200 MG/2ML IV SOLN
INTRAVENOUS | Status: AC
  Filled 2017-06-11: qty 2

## 2017-06-11 MED ORDER — SODIUM CHLORIDE 0.9 % IJ SOLN
INTRAMUSCULAR | Status: AC
  Filled 2017-06-11: qty 10

## 2017-06-11 MED ORDER — CEFAZOLIN SODIUM-DEXTROSE 2-4 GM/100ML-% IV SOLN
INTRAVENOUS | Status: AC
  Filled 2017-06-11: qty 100

## 2017-06-11 MED ORDER — FENTANYL CITRATE (PF) 100 MCG/2ML IJ SOLN: 25.0000 ug | INTRAMUSCULAR | Status: DC | PRN

## 2017-06-11 MED ORDER — OXYCODONE HCL 5 MG PO TABS
ORAL_TABLET | ORAL | Status: AC
  Administered 2017-06-11: 5 mg via ORAL
  Filled 2017-06-11: qty 1

## 2017-06-11 MED ORDER — DEXAMETHASONE SODIUM PHOSPHATE 10 MG/ML IJ SOLN
INTRAMUSCULAR | Status: DC | PRN
Start: 2017-06-11 — End: 2017-06-11
  Administered 2017-06-11: 10 mg via INTRAVENOUS

## 2017-06-11 MED ORDER — FAMOTIDINE 20 MG PO TABS
20.0000 mg | ORAL_TABLET | Freq: Once | ORAL | Status: AC
  Administered 2017-06-11: 20 mg via ORAL

## 2017-06-11 MED ORDER — PROPOFOL 10 MG/ML IV BOLUS
INTRAVENOUS | Status: AC
  Filled 2017-06-11: qty 20

## 2017-06-11 MED ORDER — PROMETHAZINE HCL 25 MG/ML IJ SOLN
6.2500 mg | INTRAMUSCULAR | Status: DC | PRN
  Administered 2017-06-11: 6.25 mg via INTRAVENOUS

## 2017-06-11 MED ORDER — PROPOFOL 10 MG/ML IV BOLUS
INTRAVENOUS | Status: DC | PRN
  Administered 2017-06-11: 150 mg via INTRAVENOUS

## 2017-06-11 MED ORDER — ROCURONIUM BROMIDE 100 MG/10ML IV SOLN
INTRAVENOUS | Status: DC | PRN
  Administered 2017-06-11: 10 mg via INTRAVENOUS
  Administered 2017-06-11: 30 mg via INTRAVENOUS

## 2017-06-11 MED ORDER — PROMETHAZINE HCL 25 MG/ML IJ SOLN: 6.2500 mg | Freq: Once | INTRAMUSCULAR | Status: DC

## 2017-06-11 SURGICAL SUPPLY — 39 items
BAG DRAIN CYSTO-URO LG1000N (MISCELLANEOUS) ×3 IMPLANT
BASKET ZERO TIP 1.9FR (BASKET) ×1 IMPLANT
BSKT STON RTRVL ZERO TP 1.9FR (BASKET) ×2
CATH URETL 5X70 OPEN END (CATHETERS) ×3 IMPLANT
CNTNR SPEC 2.5X3XGRAD LEK (MISCELLANEOUS) ×2
CONRAY 43 FOR UROLOGY 50M (MISCELLANEOUS) ×3 IMPLANT
CONT SPEC 4OZ STER OR WHT (MISCELLANEOUS) ×1
CONT SPEC 4OZ STRL OR WHT (MISCELLANEOUS) ×2
CONTAINER SPEC 2.5X3XGRAD LEK (MISCELLANEOUS) IMPLANT
DRAPE UTILITY 15X26 TOWEL STRL (DRAPES) ×3 IMPLANT
DRSG TELFA 4X3 1S NADH ST (GAUZE/BANDAGES/DRESSINGS) ×3 IMPLANT
ELECT REM PT RETURN 9FT ADLT (ELECTROSURGICAL)
ELECTRODE REM PT RTRN 9FT ADLT (ELECTROSURGICAL) ×2 IMPLANT
FIBER LASER LITHO 273 (Laser) ×1 IMPLANT
FORCEPS BIOP PIRANHA Y (CUTTING FORCEPS) ×1 IMPLANT
GLOVE BIO SURGEON STRL SZ 6.5 (GLOVE) ×3 IMPLANT
GOWN STRL REUS W/ TWL LRG LVL3 (GOWN DISPOSABLE) ×4 IMPLANT
GOWN STRL REUS W/TWL LRG LVL3 (GOWN DISPOSABLE) ×6
GUIDEWIRE GREEN .038 145CM (MISCELLANEOUS) ×1 IMPLANT
INFUSOR MANOMETER BAG 3000ML (MISCELLANEOUS) ×3 IMPLANT
INTRODUCER DILATOR DOUBLE (INTRODUCER) ×1 IMPLANT
KIT RM TURNOVER CYSTO AR (KITS) ×3 IMPLANT
NDL SAFETY ECLIPSE 18X1.5 (NEEDLE) ×2 IMPLANT
NEEDLE HYPO 18GX1.5 SHARP (NEEDLE) ×3
PACK CYSTO AR (MISCELLANEOUS) ×3 IMPLANT
SCRUB POVIDONE IODINE 4 OZ (MISCELLANEOUS) ×3 IMPLANT
SENSORWIRE 0.038 NOT ANGLED (WIRE) ×3
SET CYSTO W/LG BORE CLAMP LF (SET/KITS/TRAYS/PACK) ×3 IMPLANT
SHEATH URETERAL 12FRX35CM (MISCELLANEOUS) ×1 IMPLANT
SOL .9 NS 3000ML IRR  AL (IV SOLUTION) ×1
SOL .9 NS 3000ML IRR AL (IV SOLUTION) ×2
SOL .9 NS 3000ML IRR UROMATIC (IV SOLUTION) ×2 IMPLANT
STENT URET 6FRX24 CONTOUR (STENTS) IMPLANT
STENT URET 6FRX26 CONTOUR (STENTS) ×1 IMPLANT
SURGILUBE 2OZ TUBE FLIPTOP (MISCELLANEOUS) ×3 IMPLANT
SYRINGE IRR TOOMEY STRL 70CC (SYRINGE) ×3 IMPLANT
WATER STERILE IRR 1000ML POUR (IV SOLUTION) ×3 IMPLANT
WATER STERILE IRR 3000ML UROMA (IV SOLUTION) ×3 IMPLANT
WIRE SENSOR 0.038 NOT ANGLED (WIRE) ×2 IMPLANT

## 2017-06-11 NOTE — Anesthesia Preprocedure Evaluation (Signed)
Anesthesia Evaluation  Patient identified by MRN, date of birth, ID band Patient awake    Reviewed: Allergy & Precautions, H&P , NPO status , Patient's Chart, lab work & pertinent test results  History of Anesthesia Complications Negative for: history of anesthetic complications  Airway Mallampati: III  TM Distance: >3 FB Neck ROM: full    Dental  (+) Chipped, Caps   Pulmonary neg shortness of breath, asthma , former smoker,           Cardiovascular Exercise Tolerance: Good hypertension, (-) angina+ Peripheral Vascular Disease  (-) Past MI and (-) DOE + Valvular Problems/Murmurs      Neuro/Psych PSYCHIATRIC DISORDERS negative neurological ROS     GI/Hepatic negative GI ROS, Neg liver ROS,   Endo/Other  Hypothyroidism   Renal/GU      Musculoskeletal   Abdominal   Peds  Hematology negative hematology ROS (+)   Anesthesia Other Findings Past Medical History: No date: Allergy     Comment: allergic rhinitis No date: Asthma     Comment: mostly in childhood No date: BPH (benign prostatic hypertrophy) No date: Heart murmur No date: History of kidney stones No date: Hypertension No date: Hypertriglyceridemia No date: Hypothyroidism No date: Thyroid disease     Comment: hypothyroidism  Past Surgical History: 2006: CARDIAC CATHETERIZATION     Comment: [place] 2006: CARDIOVASCULAR STRESS TEST     Comment: [place] 2006: CHOLECYSTECTOMY No date: EYE SURGERY Bilateral     Comment: Cataract Extraction with IOL 1997: VEIN LIGATION AND STRIPPING Bilateral     Reproductive/Obstetrics negative OB ROS                             Anesthesia Physical  Anesthesia Plan  ASA: III  Anesthesia Plan: General ETT   Post-op Pain Management:    Induction: Intravenous  PONV Risk Score and Plan: 2 and Ondansetron and Dexamethasone  Airway Management Planned: Oral ETT  Additional Equipment:    Intra-op Plan:   Post-operative Plan: Extubation in OR  Informed Consent: I have reviewed the patients History and Physical, chart, labs and discussed the procedure including the risks, benefits and alternatives for the proposed anesthesia with the patient or authorized representative who has indicated his/her understanding and acceptance.   Dental Advisory Given  Plan Discussed with: Anesthesiologist, CRNA and Surgeon  Anesthesia Plan Comments: (Patient consented for risks of anesthesia including but not limited to:  - adverse reactions to medications - damage to teeth, lips or other oral mucosa - sore throat or hoarseness - Damage to heart, brain, lungs or loss of life  Patient voiced understanding.)        Anesthesia Quick Evaluation

## 2017-06-11 NOTE — H&P (Signed)
05/09/2017  --> updated on 06/11/17 S/p ureteral stent on 05/24/17 who returns today for staged procedure as below Several trips to ED for pain/ constipation in the interim UCx negative RRR CTAB  Jeffrey Tucker 1956-02-12 [id number]  Referring provider: Venia Carbon, MD [address] [place], [place] [address]      Chief Complaint  Patient presents with  . Results    CT     HPI: The patient is a 61 year old gentleman who presents today for evaluation of proximal left ureteral enhancement consistent with regional infection on a recent CT scan. This is per CT report from [facility] which I do not have access to the images. Repeat CT urogram at this time shows persistence of this area with concern for possible TCC of his left upper tract. He does have 2 nonobstructive left renal calculi as well with the largest being approximately 4 mm. CT shows benign renal cysts as well  The CT was performed for left lower quadrant pain that had occurred for 3-4 months. He noted his intermittent pain in his left lower quadrant that was sharp. He will require pain medication once or twice per month 4. He has never had this before. He denies hematuria. He denies known history of nephrolithiasis. He denies left flank pain.  Repeat CT shows no explanation for his symptoms.  The patient does note nocturia 1-3. Does strain. He does have some hesitancy and weak stream. He is not interested inmedications at this time.  The patient last had a PSA in November 2017 which was 0.46.     PMH:     Past Medical History:  Diagnosis Date  . Allergy    allergic rhinitis  . Asthma    mostly in childhood  . BPH (benign prostatic hypertrophy)   . Hypertension   . Hypertriglyceridemia   . Thyroid disease    hypothyroidism    Surgical History:      Past Surgical History:  Procedure Laterality Date  . CARDIAC CATHETERIZATION  2006   [place]  . CARDIOVASCULAR  STRESS TEST  2006   [place]  . CHOLECYSTECTOMY  2006  . VEIN LIGATION AND STRIPPING  1997    Home Medications:      Allergies as of 05/09/2017      Reactions   Tetracycline Hcl    REACTION: joint pain   Amlodipine Swelling   Lower extremity swelling--tolerated 5mg  dose               Medication List           Accurate as of 05/09/17  4:03 PM. Always use your most recent med list.           albuterol 108 (90 Base) MCG/ACT inhaler Commonly known as:  PROAIR HFA Inhale 2 puffs into the lungs 3 (three) times daily as needed. Asthma flare up   amLODipine 5 MG tablet Commonly known as:  NORVASC Take 1 tablet (5 mg total) by mouth daily.   aspirin 81 MG tablet Take 81 mg by mouth daily.   Fish Oil 1000 MG Caps Take by mouth.   levothyroxine 125 MCG tablet Commonly known as:  SYNTHROID, LEVOTHROID Take 1 tablet (125 mcg total) by mouth daily.   lisinopril 10 MG tablet Commonly known as:  PRINIVIL,ZESTRIL Take 1 tablet (10 mg total) by mouth daily.       Allergies:       Allergies  Allergen Reactions  . Tetracycline Hcl  REACTION: joint pain  . Amlodipine Swelling    Lower extremity swelling--tolerated 5mg  dose    Family History:      Family History  Problem Relation Age of Onset  . Alcohol abuse Father   . Prostate cancer Father   . Alcohol abuse Mother   . Asthma Mother   . Liver cancer Paternal Grandmother   . Colon cancer Neg Hx   . Coronary artery disease Neg Hx   . Hypertension Neg Hx   . Diabetes Neg Hx   . Bladder Cancer Neg Hx   . Kidney cancer Neg Hx     Social History:  reports that he quit smoking about 38 years ago. He has never used smokeless tobacco. He reports that he does not drink alcohol or use drugs.  ROS: UROLOGY Frequent Urination?: No Hard to postpone urination?: No Burning/pain with urination?: No Get up at night to urinate?: No Leakage of urine?: No Urine stream  starts and stops?: No Trouble starting stream?: No Do you have to strain to urinate?: No Blood in urine?: No Urinary tract infection?: No Sexually transmitted disease?: No Injury to kidneys or bladder?: No Painful intercourse?: No Weak stream?: No Erection problems?: No Penile pain?: No  Gastrointestinal Nausea?: No Vomiting?: No Indigestion/heartburn?: No Diarrhea?: No Constipation?: No  Constitutional Fever: No Night sweats?: No Weight loss?: No Fatigue?: No  Skin Skin rash/lesions?: No Itching?: No  Eyes Blurred vision?: No Double vision?: No  Ears/Nose/Throat Sore throat?: No Sinus problems?: No  Hematologic/Lymphatic Swollen glands?: No Easy bruising?: No  Cardiovascular Leg swelling?: No Chest pain?: No  Respiratory Cough?: No Shortness of breath?: No  Endocrine Excessive thirst?: No  Musculoskeletal Back pain?: No Joint pain?: No  Neurological Headaches?: No Dizziness?: No  Psychologic Depression?: No Anxiety?: No  Physical Exam: BP (!) 154/61   Pulse (!) 56   Ht 5\' 6"  (1.676 m)   Wt 162 lb 1.6 oz (73.5 kg)   BMI 26.16 kg/m   Constitutional:  Alert and oriented, No acute distress. HEENT: [place] AT, moist mucus membranes.  Trachea midline, no masses. Cardiovascular: No clubbing, cyanosis, or edema. Respiratory: Normal respiratory effort, no increased work of breathing. GI: Abdomen is soft, nontender, nondistended, no abdominal masses GU: No CVA tenderness.  Skin: No rashes, bruises or suspicious lesions. Lymph: No cervical or inguinal adenopathy. Neurologic: Grossly intact, no focal deficits, moving all 4 extremities. Psychiatric: Normal mood and affect.  Laboratory Data: RecentLabs       Lab Results  Component Value Date   WBC 7.6 02/01/2017   HGB 13.7 02/01/2017   HCT 40.5 02/01/2017   MCV 92.5 02/01/2017   PLT 285 02/01/2017      RecentLabs       Lab Results  Component Value Date    CREATININE 0.90 05/02/2017      RecentLabs       Lab Results  Component Value Date   PSA 0.46 09/28/2016   PSA 0.56 09/20/2015   PSA 0.33 09/09/2013      RecentLabs       Lab Results  Component Value Date   TESTOSTERONE 295 (L) 09/20/2015      RecentLabs  No results found for: HGBA1C    Urinalysis Labs(Brief)          Component Value Date/Time   APPEARANCEUR Negative 04/11/2017 1452   GLUCOSEU Negative 04/11/2017 1452   BILIRUBINUR Negative 04/11/2017 1452   PROTEINUR Negative 04/11/2017 1452   NITRITE Negative 04/11/2017 1452  LEUKOCYTESUR Negative 04/11/2017 1452      Pertinent Imaging: CT urogram reviewed as above.  Assessment & Plan:   1. Left renal pelvis lesion I discussed the patient that he is a filling defect in the left renal pelvis/proximal ureter that could potentially represent a malignancy. We discussed at this point the best option would be to undergo cystoscopy, left ureteroscopy, possible tumor biopsy, possible laser ablation of tumor, left ureteral stent placement. We did discuss that this could be a TCC of the bladder and depending on the grade this may be enough to treat the disease though if he has high-grade disease hernia removal of his left kidney and ureter. We discussed the risks, benefits, and indications of the procedure. He understands the risks include but are not limited to bleeding, infection, iatrogenic injury, need for repeat procedures, need for ureteral stent placement. All questions were answered. He is agreeable to proceeding.  2. Renal cyst Benign on CT Urogram  3. Left nephrolithiasis May attempt removal of the time of his left ureteroscopy if feasible  4.BPH The patient is not bothered by symptoms  5. Prostate cancer screening Up-to-date.  Nickie Retort, MD  [facility] [address] [place], [place] [address] [phone]

## 2017-06-11 NOTE — Anesthesia Procedure Notes (Signed)
Procedure Name: Intubation Date/Time: 06/11/2017 11:23 AM Performed by: [facility] Pre-anesthesia Checklist: Patient identified, Patient being monitored, Timeout performed, Emergency Drugs available and Suction available Patient Re-evaluated:Patient Re-evaluated prior to induction Oxygen Delivery Method: Circle system utilized Preoxygenation: Pre-oxygenation with 100% oxygen Induction Type: IV induction Ventilation: Mask ventilation without difficulty Laryngoscope Size: Mac and 3 Grade View: Grade I Tube type: Oral Tube size: 7.0 mm Number of attempts: 1 Airway Equipment and Method: Stylet Placement Confirmation: ETT inserted through vocal cords under direct vision,  positive ETCO2 and breath sounds checked- equal and bilateral Secured at: 21 cm Tube secured with: Tape Dental Injury: Teeth and Oropharynx as per pre-operative assessment

## 2017-06-11 NOTE — Anesthesia Post-op Follow-up Note (Cosign Needed)
Anesthesia QCDR form completed.        

## 2017-06-11 NOTE — Discharge Instructions (Signed)
You have a ureteral stent in place.  This is a tube that extends from your kidney to your bladder.  This may cause urinary bleeding, burning with urination, and urinary frequency.  Please call our office or present to the ED if you develop fevers >101 or pain which is not able to be controlled with oral pain medications.  You may be given either Flomax and/ or ditropan to help with bladder spasms and stent pain in addition to pain medications.   ° °[facility] °[address], [address] °[place], [place] [address] °[phone] ° ° ° °AMBULATORY SURGERY  °DISCHARGE INSTRUCTIONS ° ° °1) The drugs that you were given will stay in your system until tomorrow so for the next 24 hours you should not: ° °A) Drive an automobile °B) Make any legal decisions °C) Drink any alcoholic beverage ° ° °2) You may resume regular meals tomorrow.  Today it is better to start with liquids and gradually work up to solid foods. ° °You may eat anything you prefer, but it is better to start with liquids, then soup and crackers, and gradually work up to solid foods. ° ° °3) Please notify your doctor immediately if you have any unusual bleeding, trouble breathing, redness and pain at the surgery site, drainage, fever, or pain not relieved by medication. ° ° ° °4) Additional Instructions: ° ° ° ° ° ° ° °Please contact your physician with any problems or Same Day Surgery at [phone], Monday through Friday 6 am to 4 pm, or [place] [facility] [place] Main number at [phone]. °

## 2017-06-11 NOTE — Transfer of Care (Signed)
Immediate Anesthesia Transfer of Care Note  Patient: ARGELIO GRANIER  Procedure(s) Performed: Procedure(s): URETEROSCOPY WITH HOLMIUM LASER LITHOTRIPSY (Left) CYSTOSCOPY WITH STENT REPLACEMENT (Left) RENAL PELVIS TUMOR WITH BIOPSY WITH POSSIBLE LASER ABLATION (Left)  Patient Location: PACU  Anesthesia Type:General  Level of Consciousness: sedated  Airway & Oxygen Therapy: Patient Spontanous Breathing and Patient connected to face mask  Post-op Assessment: Report given to RN and Post -op Vital signs reviewed and stable  Post vital signs: Reviewed and stable  Last Vitals:  Vitals:   06/11/17 1301 06/11/17 1315  BP: (!) 179/50 (!) 184/49  Pulse: (!) 49 (!) 48  Resp: 16 14  Temp: (!) 36 C     Last Pain:  Vitals:   06/11/17 0939  TempSrc: Tympanic         Complications: No apparent anesthesia complications

## 2017-06-11 NOTE — Op Note (Signed)
Date of procedure: 06/11/17  Preoperative diagnosis:  1. Left renal pelvic lesion 2. Left kidney stones   Postoperative diagnosis:  1. Same   Procedure: 1. Left ureteroscopy 2. Laser lithotripsy 3. Basket extraction of Stone fragment 4. Left retrograde pyelogram 5. Left renal pelvic biopsy 6. Left ureteral stent placement  Surgeon: Hollice Espy, MD  Anesthesia: General  Complications: None  Intraoperative findings: Edematous mucosa of left renal pelvis and proximal ureter, slightly erythematous, biopsied times multiple. 7 mm left lower pole stone treated, unable to identify midpole stone.  EBL: Minimal  Specimens: Stone fragment, renal pelvis biopsy times multiple  Drains: 6 x 26 French double-J ureteral stent on left  Indication: Jeffrey Tucker is a 61 y.o. patient with with a history of left ureteral enhancement returns today for a staged diagnostic ureteroscopy as well as treatment of nonobstructing calculi on the left.  After reviewing the management options for treatment, he elected to proceed with the above surgical procedure(s). We have discussed the potential benefits and risks of the procedure, side effects of the proposed treatment, the likelihood of the patient achieving the goals of the procedure, and any potential problems that might occur during the procedure or recuperation. Informed consent has been obtained.  Description of procedure:  The patient was taken to the operating room and general anesthesia was induced.  The patient was placed in the dorsal lithotomy position, prepped and draped in the usual sterile fashion, and preoperative antibiotics were administered. A preoperative time-out was performed.   A 21 French scope was advanced per urethra into the bladder. Attention was turned to the left ureteral orifice from which a ureteral stent was seen emanating. The distal coil of the stent was grasped and brought to level of the urethral meatus. At this point  in time, the stent was cannulated using a wire up to level of the kidney. The wire was left in place and the stent was removed.  A dual lumen catheter was used just within the distal ureter under fluoroscopic guidance to perform a retrograde pyelogram around the wire which showed no hydronephrosis or filling defects. A Super Stiff wire was then introduced up to level of the collecting system. The sensor wire was snapped in place as a safety wire. A ureteral access sheath, Lacinda Axon 12/14 was then advanced up the ureter to level of the proximal ureter. The inner cannula was removed. A dual lumen 8 French flexible ureteroscope was then introduced into the upper tract collecting system. The proximal ureter and renal pelvis were slightly thickened and edematous in appearance. There is no obvious papillary tumor. It was also slightly erythematous. The entire upper tract was directly visualized using the cystoscope. A 7 mm lower pole stone was identified. A 273  laser fiber was then brought in and using the settings of 0.8 J and 12 Hz, a stone was fragmented into approximately 8 or so smaller pieces. Each of these pieces were then extracted using a nitinol basket. The midpole stone seen on CT scan was unable to be visualized. A retrograde pyelogram was repeated to create a roadmap to ensure that each never calyx was inspected. Despite this, I was unable to identify the stone. It could be seen on scout imaging appeared to be outside the collecting system. Finally, piranha forceps were used to take approximately 6 or 7 biopsies of the renal pelvis for pathologic analysis. These were very small biopsies. Finally, the ureteroscope and sheath were removed. The ureter was inspected along the  way to ensure that there were no ureteral injuries or stones. The safety wire was backloaded over a rigid cystoscope. A 6 x 26 French double-J ureteral stent was then advanced over the wire to level of the renal pelvis. The wire was fully  withdrawn until coil was noted within the renal pelvis. The wire was then fully withdrawn and full course and within the bladder. The bladder was then drained. The patient was cleaned and dried, repositioned the supine position, reversed from anesthesia, taken to the PACU in stable condition.   Plan: Patient will follow-up next week for biopsy results as well as cystoscopy, stent removal.  Hollice Espy, M.D.

## 2017-06-11 NOTE — Anesthesia Postprocedure Evaluation (Signed)
Anesthesia Post Note  Patient: Jeffrey Tucker  Procedure(s) Performed: Procedure(s) (LRB): URETEROSCOPY WITH HOLMIUM LASER LITHOTRIPSY (Left) CYSTOSCOPY WITH STENT REPLACEMENT (Left) RENAL PELVIS TUMOR WITH BIOPSY WITH POSSIBLE LASER ABLATION (Left)  Patient location during evaluation: PACU Anesthesia Type: General Level of consciousness: awake Pain management: pain level controlled Vital Signs Assessment: post-procedure vital signs reviewed and stable Respiratory status: spontaneous breathing Cardiovascular status: stable Anesthetic complications: no     Last Vitals:  Vitals:   06/11/17 1401 06/11/17 1413  BP: (!) 163/45   Pulse: (!) 46 (!) 56  Resp: 14 17  Temp: (!) 36 C     Last Pain:  Vitals:   06/11/17 1401  TempSrc:   PainSc: 2                  VAN STAVEREN,Cheria Sadiq

## 2017-06-12 ENCOUNTER — Encounter: Payer: Self-pay | Admitting: Urology

## 2017-06-13 LAB — SURGICAL PATHOLOGY

## 2017-06-19 ENCOUNTER — Encounter: Payer: Self-pay | Admitting: Urology

## 2017-06-19 ENCOUNTER — Ambulatory Visit (INDEPENDENT_AMBULATORY_CARE_PROVIDER_SITE_OTHER): Payer: Self-pay | Admitting: Urology

## 2017-06-19 VITALS — BP 144/48 | HR 54 | Ht 66.0 in | Wt 157.0 lb

## 2017-06-19 DIAGNOSIS — N200 Calculus of kidney: Secondary | ICD-10-CM

## 2017-06-19 DIAGNOSIS — N289 Disorder of kidney and ureter, unspecified: Secondary | ICD-10-CM

## 2017-06-19 LAB — STONE ANALYSIS
CA OXALATE, DIHYDRATE: 30 %
CA PHOS CRY STONE QL IR: 5 %
Ca Oxalate,Monohydr.: 65 %
Stone Weight KSTONE: 36.2 mg

## 2017-06-19 LAB — MICROSCOPIC EXAMINATION: Bacteria, UA: NONE SEEN

## 2017-06-19 LAB — URINALYSIS, COMPLETE
BILIRUBIN UA: NEGATIVE
Glucose, UA: NEGATIVE
Ketones, UA: NEGATIVE
NITRITE UA: NEGATIVE
PH UA: 5.5 (ref 5.0–7.5)
SPEC GRAV UA: 1.025 (ref 1.005–1.030)
Urobilinogen, Ur: 0.2 mg/dL (ref 0.2–1.0)

## 2017-06-19 MED ORDER — LIDOCAINE HCL 2 % EX GEL
1.0000 "application " | Freq: Once | CUTANEOUS | Status: AC
  Administered 2017-06-19: 1 via URETHRAL

## 2017-06-19 MED ORDER — CIPROFLOXACIN HCL 500 MG PO TABS
500.0000 mg | ORAL_TABLET | Freq: Once | ORAL | Status: AC
  Administered 2017-06-19: 500 mg via ORAL

## 2017-06-19 NOTE — Progress Notes (Signed)
   06/19/17  CC:  Chief Complaint  Patient presents with  . Cysto Stent Removal    HPI: 61 year old male who underwent diagnostic ureteroscopy for workup of urothelial enhancement and treatment of a nonobstructing stone on 06/11/2017. Biopsies were negative for any dysplasia or malignancy. There was hyperplasia and inflammation appreciated. He returns A for cystoscopy, stent removal.  He's been doing well since surgery. He has some mild discomfort from his stent but nothing notable. No fevers or chills.  Blood pressure (!) 144/48, pulse (!) 54, height 5\' 6"  (1.676 m), weight 157 lb (71.2 kg). NED. A&Ox3.   No respiratory distress   Abd soft, NT, ND Normal phallus with bilateral descended testicles  Cystoscopy/ Stent removal procedure  Patient identification was confirmed, informed consent was obtained, and patient was prepped using Betadine solution.  Lidocaine jelly was administered per urethral meatus.    Preoperative abx where received prior to procedure.    Procedure: - Flexible cystoscope introduced, without any difficulty.   - Thorough search of the bladder revealed:    normal urethral meatus  Stent seen emanating from left ureteral orifice, grasped with stent graspers, and removed in entirety.    Post-Procedure: - Patient tolerated the procedure well  Assessment/ Plan:  1. Renal lesion Urothelial thickening, consistent with hyperplasia with inflammation but no evidence of malignancy We'll follow-up with renal ultrasound in one month - Urinalysis, Complete - lidocaine (XYLOCAINE) 2 % jelly 1 application; Place 1 application into the urethra once. - ciprofloxacin (CIPRO) tablet 500 mg; Take 1 tablet (500 mg total) by mouth once. - US Renal; Future  2. Nephrolithiasis 7 mm nonobstructing left renal stone treated Status post uncomplicated stent removal today Warning symptoms reviewed Follow-up in 4 weeks with a renal ultrasound  Return in about 4 weeks (around  07/17/2017) for RUS.   Hollice Espy, MD

## 2017-07-18 ENCOUNTER — Other Ambulatory Visit: Payer: Self-pay | Admitting: Internal Medicine

## 2017-07-18 DIAGNOSIS — I10 Essential (primary) hypertension: Secondary | ICD-10-CM

## 2017-07-23 ENCOUNTER — Ambulatory Visit: Payer: Self-pay

## 2017-07-24 ENCOUNTER — Ambulatory Visit: Payer: Self-pay | Admitting: Urology

## 2017-07-30 ENCOUNTER — Ambulatory Visit
Admission: RE | Admit: 2017-07-30 | Discharge: 2017-07-30 | Disposition: A | Discharge status: Home / Self Care | Payer: Self-pay | Source: Ambulatory Visit | Attending: Urology | Admitting: Urology

## 2017-07-30 ENCOUNTER — Telehealth: Payer: Self-pay | Admitting: Cardiovascular Disease

## 2017-07-30 DIAGNOSIS — N281 Cyst of kidney, acquired: Secondary | ICD-10-CM | POA: Insufficient documentation

## 2017-07-30 DIAGNOSIS — N289 Disorder of kidney and ureter, unspecified: Secondary | ICD-10-CM

## 2017-07-30 DIAGNOSIS — N200 Calculus of kidney: Secondary | ICD-10-CM | POA: Insufficient documentation

## 2017-07-30 DIAGNOSIS — N2889 Other specified disorders of kidney and ureter: Secondary | ICD-10-CM | POA: Insufficient documentation

## 2017-07-30 NOTE — Telephone Encounter (Signed)
Received records request from [facility], forwarded to [facility] for processing.

## 2017-07-31 ENCOUNTER — Ambulatory Visit (INDEPENDENT_AMBULATORY_CARE_PROVIDER_SITE_OTHER): Payer: Self-pay | Admitting: Urology

## 2017-07-31 ENCOUNTER — Encounter: Payer: Self-pay | Admitting: Urology

## 2017-07-31 VITALS — BP 136/64 | HR 54 | Ht 66.0 in | Wt 156.8 lb

## 2017-07-31 DIAGNOSIS — D4112 Neoplasm of uncertain behavior of left renal pelvis: Secondary | ICD-10-CM

## 2017-07-31 DIAGNOSIS — N200 Calculus of kidney: Secondary | ICD-10-CM

## 2017-07-31 NOTE — Patient Instructions (Addendum)
Calcium Oxalate    Dietary Guidelines to Help Prevent Kidney Stones Kidney stones are deposits of minerals and salts that form inside your kidneys. Your risk of developing kidney stones may be greater depending on your diet, your lifestyle, the medicines you take, and whether you have certain medical conditions. Most people can reduce their chances of developing kidney stones by following the instructions below. Depending on your overall health and the type of kidney stones you tend to develop, your dietitian may give you more specific instructions. What are tips for following this plan? Reading food labels  Choose foods with "no salt added" or "low-salt" labels. Limit your sodium intake to less than 1500 mg per day.  Choose foods with calcium for each meal and snack. Try to eat about 300 mg of calcium at each meal. Foods that contain 200-500 mg of calcium per serving include: ? 8 oz (237 ml) of milk, fortified nondairy milk, and fortified fruit juice. ? 8 oz (237 ml) of kefir, yogurt, and soy yogurt. ? 4 oz (118 ml) of tofu. ? 1 oz of cheese. ? 1 cup (300 g) of dried figs. ? 1 cup (91 g) of cooked broccoli. ? 1-3 oz can of sardines or mackerel.  Most people need 1000 to 1500 mg of calcium each day. Talk to your dietitian about how much calcium is recommended for you. Shopping  Buy plenty of fresh fruits and vegetables. Most people do not need to avoid fruits and vegetables, even if they contain nutrients that may contribute to kidney stones.  When shopping for convenience foods, choose: ? Whole pieces of fruit. ? Premade salads with dressing on the side. ? Low-fat fruit and yogurt smoothies.  Avoid buying frozen meals or prepared deli foods.  Look for foods with live cultures, such as yogurt and kefir. Cooking  Do not add salt to food when cooking. Place a salt shaker on the table and allow each person to add his or her own salt to taste.  Use vegetable protein, such as beans,  textured vegetable protein (TVP), or tofu instead of meat in pasta, casseroles, and soups. Meal planning  Eat less salt, if told by your dietitian. To do this: ? Avoid eating processed or premade food. ? Avoid eating fast food.  Eat less animal protein, including cheese, meat, poultry, or fish, if told by your dietitian. To do this: ? Limit the number of times you have meat, poultry, fish, or cheese each week. Eat a diet free of meat at least 2 days a week. ? Eat only one serving each day of meat, poultry, fish, or seafood. ? When you prepare animal protein, cut pieces into small portion sizes. For most meat and fish, one serving is about the size of one deck of cards.  Eat at least 5 servings of fresh fruits and vegetables each day. To do this: ? Keep fruits and vegetables on hand for snacks. ? Eat 1 piece of fruit or a handful of berries with breakfast. ? Have a salad and fruit at lunch. ? Have two kinds of vegetables at dinner.  Limit foods that are high in a substance called oxalate. These include: ? Spinach. ? Rhubarb. ? Beets. ? Potato chips and french fries. ? Nuts.  If you regularly take a diuretic medicine, make sure to eat at least 1-2 fruits or vegetables high in potassium each day. These include: ? Avocado. ? Banana. ? Orange, prune, carrot, or tomato juice. ? Baked potato. ? Cabbage. ?  Beans and split peas. General instructions  Drink enough fluid to keep your urine clear or pale yellow. This is the most important thing you can do.  Talk to your health care provider and dietitian about taking daily supplements. Depending on your health and the cause of your kidney stones, you may be advised: ? Not to take supplements with vitamin C. ? To take a calcium supplement. ? To take a daily probiotic supplement. ? To take other supplements such as magnesium, fish oil, or vitamin B6.  Take all medicines and supplements as told by your health care provider.  Limit  alcohol intake to no more than 1 drink a day for nonpregnant women and 2 drinks a day for men. One drink equals 12 oz of beer, 5 oz of wine, or 1 oz of hard liquor.  Lose weight if told by your health care provider. Work with your dietitian to find strategies and an eating plan that works best for you. What foods are not recommended? Limit your intake of the following foods, or as told by your dietitian. Talk to your dietitian about specific foods you should avoid based on the type of kidney stones and your overall health. Grains Breads. Bagels. Rolls. Baked goods. Salted crackers. Cereal. Pasta. Vegetables Spinach. Rhubarb. Beets. Canned vegetables. Angie Fava. Olives. Meats and other protein foods Nuts. Nut butters. Large portions of meat, poultry, or fish. Salted or cured meats. Deli meats. Hot dogs. Sausages. Dairy Cheese. Beverages Regular soft drinks. Regular vegetable juice. Seasonings and other foods Seasoning blends with salt. Salad dressings. Canned soups. Soy sauce. Ketchup. Barbecue sauce. Canned pasta sauce. Casseroles. Pizza. Lasagna. Frozen meals. Potato chips. [place] fries. Summary  You can reduce your risk of kidney stones by making changes to your diet.  The most important thing you can do is drink enough fluid. You should drink enough fluid to keep your urine clear or pale yellow.  Ask your health care provider or dietitian how much protein from animal sources you should eat each day, and also how much salt and calcium you should have each day. This information is not intended to replace advice given to you by your health care provider. Make sure you discuss any questions you have with your health care provider. Document Released: 03/09/2011 Document Revised: 10/23/2016 Document Reviewed: 10/23/2016 Elsevier Interactive Patient Education  2017 [employer].

## 2017-07-31 NOTE — Progress Notes (Signed)
07/31/2017 8:26 PM   Jeffrey Tucker 01-May-1956 [id number]  Referring provider: Venia Carbon, MD [place], [place] [address]  No chief complaint on file.   HPI: 61 year old male with a history of left proximal urothelial enhancement identified on the CT scan [facility]. Repeat CT urogram on 05/02/2017 showed persistent thickening in the hips.. He underwent staged ureteroscopy including treatment of the stone nonobstructing left lower pole stone (midpole stone unable to be identified within the collecting system) and renal pelvic biopsies. Intraoperatively, the renal pelvis was noted to be edematous without obvious papillary change. Biopsy was consistent with urothelial hyperplasia and inflammation.  Stone analysis shows 30% calcium oxalate dihydrate, 65% calcium oxalate monohydrate, and 5% calcium phosphate.  No previous stone episodes.    He returns today for routine follow-up. Follow-up renal ultrasound shows no hydronephrosis. The midpole stone which was not identified ureteroscopically seen with resolution of the lower pole stone. He does have a large right simple renal cyst.  He denies any further left flank pain. He was having intermittent discomfort in his right abdomen/flank prior to ureteroscopy. This is since resolved. He denies any fevers, chills, or weight loss. No significant urinary symptoms.  He is originally from [place].  The remote past, he is troubled M.D. and other exotic locations. He does not recall any infectious disease or TB exposure.  PMH: Past Medical History:  Diagnosis Date  . Allergy    allergic rhinitis  . Asthma    mostly in childhood  . BPH (benign prostatic hypertrophy)   . Heart murmur   . History of kidney stones   . Hypertension   . Hypertriglyceridemia   . Hypothyroidism   . Thyroid disease    hypothyroidism    Surgical History: Past Surgical History:  Procedure Laterality Date  . CARDIAC  CATHETERIZATION  2006   [place]  . CARDIOVASCULAR STRESS TEST  2006   [place]  . CHOLECYSTECTOMY  2006  . CYSTOSCOPY W/ URETERAL STENT PLACEMENT  05/24/2017   Procedure: left;  Surgeon: Nickie Retort, MD;  Location: [facility];  Service: Urology;;  . Consuela Mimes W/ URETERAL STENT PLACEMENT Left 06/11/2017   Procedure: CYSTOSCOPY WITH STENT REPLACEMENT;  Surgeon: Hollice Espy, MD;  Location: [facility];  Service: Urology;  Laterality: Left;  . CYSTOSCOPY WITH BIOPSY Left 06/11/2017   Procedure: RENAL PELVIS TUMOR WITH BIOPSY WITH POSSIBLE LASER ABLATION;  Surgeon: Hollice Espy, MD;  Location: [facility];  Service: Urology;  Laterality: Left;  . CYSTOSCOPY WITH STENT PLACEMENT Left 05/24/2017   Procedure: , cystoscopy,left ureteral stent placement and left ureteroscopy attempted, retrograde pylogram;  Surgeon: Nickie Retort, MD;  Location: [facility];  Service: Urology;  Laterality: Left;  . EYE SURGERY Bilateral    Cataract Extraction with IOL  . URETEROSCOPY WITH HOLMIUM LASER LITHOTRIPSY Left 06/11/2017   Procedure: URETEROSCOPY WITH HOLMIUM LASER LITHOTRIPSY;  Surgeon: Hollice Espy, MD;  Location: [facility];  Service: Urology;  Laterality: Left;  Marland Kitchen VEIN LIGATION AND STRIPPING Bilateral 1997    Home Medications:  Allergies as of 07/31/2017      Reactions   Tetracycline Hcl Other (See Comments)   Joint pain (and stiffened them, also)   Amlodipine Swelling   Lower extremities and body became swollen; can tolerate 5 mg doses, however      Medication List       Accurate as of 07/31/17 11:59 PM. Always use your most recent med list.  albuterol 108 (90 Base) MCG/ACT inhaler Commonly known as:  PROAIR HFA Inhale 2 puffs into the lungs 3 (three) times daily as needed. Asthma flare up   amLODipine 5 MG tablet Commonly known as:  NORVASC Take 1 tablet (5 mg total) by mouth daily.   aspirin 81 MG tablet Take 81 mg by mouth daily.   cetirizine 10 MG tablet Commonly  known as:  ZYRTEC Take 10 mg by mouth daily.   EQL OMEGA 3 FISH OIL 1200 MG Caps Take 2,400 mg by mouth daily.   Fluticasone-Salmeterol 100-50 MCG/DOSE Aepb Commonly known as:  ADVAIR Inhale 1 puff into the lungs daily as needed (when symptomatic).   levothyroxine 125 MCG tablet Commonly known as:  SYNTHROID, LEVOTHROID Take 1 tablet (125 mcg total) by mouth daily.   lisinopril 10 MG tablet Commonly known as:  PRINIVIL,ZESTRIL TAKE 1 TABLET BY MOUTH DAILY            Discharge Care Instructions        Start     Ordered   07/31/17 0000  CT HEMATURIA WORKUP    Question Answer Comment  Reason for Exam (SYMPTOM  OR DIAGNOSIS REQUIRED) left renal thickening   Preferred imaging location? [facility]      07/31/17 1623      Allergies:  Allergies  Allergen Reactions  . Tetracycline Hcl Other (See Comments)    Joint pain (and stiffened them, also)  . Amlodipine Swelling    Lower extremities and body became swollen; can tolerate 5 mg doses, however    Family History: Family History  Problem Relation Age of Onset  . Alcohol abuse Father   . Prostate cancer Father   . Alcohol abuse Mother   . Asthma Mother   . Liver cancer Paternal Grandmother   . Colon cancer Neg Hx   . Coronary artery disease Neg Hx   . Hypertension Neg Hx   . Diabetes Neg Hx   . Bladder Cancer Neg Hx   . Kidney cancer Neg Hx     Social History:  reports that he quit smoking about 38 years ago. His smoking use included Cigarettes. He has never used smokeless tobacco. He reports that he does not drink alcohol or use drugs.  ROS: UROLOGY Frequent Urination?: No Hard to postpone urination?: No Burning/pain with urination?: No Get up at night to urinate?: No Leakage of urine?: No Urine stream starts and stops?: No Trouble starting stream?: No Do you have to strain to urinate?: No Blood in urine?: No Urinary tract infection?: No Sexually transmitted disease?: No Injury to kidneys  or bladder?: No Painful intercourse?: No Weak stream?: No Erection problems?: No Penile pain?: No  Gastrointestinal Nausea?: No Vomiting?: No Indigestion/heartburn?: No Diarrhea?: No Constipation?: No  Constitutional Fever: No Night sweats?: No Weight loss?: No Fatigue?: No  Skin Skin rash/lesions?: No Itching?: No  Eyes Blurred vision?: No Double vision?: No  Ears/Nose/Throat Sore throat?: No Sinus problems?: No  Hematologic/Lymphatic Swollen glands?: No Easy bruising?: No  Cardiovascular Leg swelling?: No Chest pain?: No  Respiratory Cough?: No Shortness of breath?: No  Endocrine Excessive thirst?: No  Musculoskeletal Back pain?: No Joint pain?: No  Neurological Headaches?: No Dizziness?: No  Psychologic Depression?: No Anxiety?: No  Physical Exam: BP 136/64 (BP Location: Left Arm, Patient Position: Sitting, Cuff Size: Large)   Pulse (!) 54   Ht 5\' 6"  (1.676 m)   Wt 156 lb 12.8 oz (71.1 kg)   BMI 25.31 kg/m  Constitutional:  Alert and oriented, No acute distress. HEENT: [place] AT, moist mucus membranes.  Trachea midline, no masses. Cardiovascular: No clubbing, cyanosis, or edema. Respiratory: Normal respiratory effort, no increased work of breathing. GI: Abdomen is soft, nontender, nondistended, no abdominal masses GU: No CVA tenderness. Skin: No rashes, bruises or suspicious lesions. Neurologic: Grossly intact, no focal deficits, moving all 4 extremities. Psychiatric: Normal mood and affect.  Laboratory Data: Lab Results  Component Value Date   WBC 11.3 (H) 05/28/2017   HGB 13.2 05/28/2017   HCT 39.5 05/28/2017   MCV 91.4 05/28/2017   PLT 246 05/28/2017    Lab Results  Component Value Date   CREATININE 1.16 05/28/2017    Lab Results  Component Value Date   PSA 0.46 09/28/2016   PSA 0.56 09/20/2015   PSA 0.33 09/09/2013    Lab Results  Component Value Date   TESTOSTERONE 295 (L) 09/20/2015     Urinalysis N/a  Pertinent Imaging: CLINICAL DATA:  61 year old male with renal stones. Left-sided ureteral thickening post biopsy with negative pathology. Left-sided stent is been placed and removed. Subsequent encounter.  EXAM: RENAL / URINARY TRACT ULTRASOUND COMPLETE  COMPARISON:  05/28/2017 and 05/02/2017 CT.  FINDINGS: Right Kidney:  Length: 11 cm. No hydronephrosis. Posterolateral 6.9 x 6.1 x 6.9 cm cyst. CT detected right upper pole 1 cm lesion (possibly a proteinaceous cyst) not clearly seen by ultrasound. Tiny echogenic foci of indeterminate etiology as no stones or vascular calcifications seen on recent CT.  Left Kidney:  Length: 11.8 mm. No hydronephrosis. Nonobstructing 6.7 mm stone mid aspect left kidney.  Bladder:  Appears normal for degree of bladder distention.  IMPRESSION: No evidence of left-sided hydronephrosis post stent removal.  Nonobstructing 6.7 mm left renal stone interpolar region.  No obvious left renal pelvis mass as questioned on 05/02/2017 CT. Ultrasound may not detect urothelial abnormality (better assessed by CT if clinically desired).  6.9 cm right renal cyst.  CT detected right upper pole 1 cm lesion (possibly a proteinaceous cyst) not clearly seen by ultrasound.   Electronically Signed   By: Genia Del M.D.   On: 07/30/2017 16:18  Assessment & Plan:    1. 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 underlying focal infectious process Recommend repeat CT urogram at 6 month interval from last scan to ensure resolution, if fails to resolve, will refer to ID asymptomatic - CT HEMATURIA WORKUP; Future  2. Left nephrolithiasis S/p left URS with treatment of lower pole stone Stone analysis reviewed We discussed general stone prevention techniques including drinking plenty water with goal of producing 2.5 L urine daily, increased citric  acid intake, avoidance of high oxalate containing foods, and decreased salt intake.  Information about dietary recommendations given today.    Return in about 6 months (around 01/28/2018) for [place], MD  [facility] [address] [place], [place] [address] [phone]

## 2017-08-02 ENCOUNTER — Other Ambulatory Visit: Payer: Self-pay | Admitting: Urology

## 2017-08-02 DIAGNOSIS — N289 Disorder of kidney and ureter, unspecified: Secondary | ICD-10-CM

## 2017-08-15 ENCOUNTER — Telehealth: Payer: Self-pay | Admitting: Cardiovascular Disease

## 2017-08-15 NOTE — Telephone Encounter (Signed)
Received records request from [facility] [place] forwarded to [facility] for processing

## 2017-09-09 ENCOUNTER — Ambulatory Visit (INDEPENDENT_AMBULATORY_CARE_PROVIDER_SITE_OTHER): Payer: Self-pay | Admitting: Internal Medicine

## 2017-09-09 ENCOUNTER — Encounter: Payer: Self-pay | Admitting: Internal Medicine

## 2017-09-09 VITALS — BP 118/60 | HR 61 | Temp 98.0°F | Wt 162.0 lb

## 2017-09-09 DIAGNOSIS — Z23 Encounter for immunization: Secondary | ICD-10-CM

## 2017-09-09 DIAGNOSIS — J4530 Mild persistent asthma, uncomplicated: Secondary | ICD-10-CM

## 2017-09-09 DIAGNOSIS — J45909 Unspecified asthma, uncomplicated: Secondary | ICD-10-CM | POA: Insufficient documentation

## 2017-09-09 NOTE — Addendum Note (Signed)
Addended by: Lurlean Nanny on: 09/09/2017 05:10 PM   Modules accepted: Orders

## 2017-09-09 NOTE — Assessment & Plan Note (Signed)
Cough variant asthma Discussed the ACEI--has been on for some time and had these symptoms before this was even started Only in fall and winter Asked him to restart the advair bid If ongoing symptoms, pulmonary referral Should use the advair through till spring

## 2017-09-09 NOTE — Progress Notes (Signed)
Subjective:    Patient ID: Jeffrey Tucker, male    DOB: Nov 09, 1956, 61 y.o.   MRN: [id number]  HPI Here due to recurrent respiratory symptoms  Started with cough over the past week Seems like the same thing as last fall Using the albuterol regularly due to the "asthma" that the cough brings on Bought new advair-- started it daily for the past 2 days  No cough on the lisinopril other times  No fever Cough is mostly dry---slight mucus Sleep affected ---can't sleep flat  Current Outpatient Prescriptions on File Prior to Visit  Medication Sig Dispense Refill  . albuterol (PROAIR HFA) 108 (90 Base) MCG/ACT inhaler Inhale 2 puffs into the lungs 3 (three) times daily as needed. Asthma flare up (Patient taking differently: Inhale 2 puffs into the lungs 3 (three) times daily as needed for wheezing or shortness of breath. Asthma flare up) 1 Inhaler 2  . amLODipine (NORVASC) 5 MG tablet Take 1 tablet (5 mg total) by mouth daily. 90 tablet 3  . aspirin 81 MG tablet Take 81 mg by mouth daily.    . cetirizine (ZYRTEC) 10 MG tablet Take 10 mg by mouth daily.    . Fluticasone-Salmeterol (ADVAIR) 100-50 MCG/DOSE AEPB Inhale 1 puff into the lungs daily as needed (when symptomatic).     Marland Kitchen levothyroxine (SYNTHROID, LEVOTHROID) 125 MCG tablet Take 1 tablet (125 mcg total) by mouth daily. 90 tablet 3  . lisinopril (PRINIVIL,ZESTRIL) 10 MG tablet TAKE 1 TABLET BY MOUTH DAILY 90 tablet 0  . Omega-3 Fatty Acids (EQL OMEGA 3 FISH OIL) 1200 MG CAPS Take 2,400 mg by mouth daily.      No current facility-administered medications on file prior to visit.     Allergies  Allergen Reactions  . Tetracycline Hcl Other (See Comments)    Joint pain (and stiffened them, also)  . Amlodipine Swelling    Lower extremities and body became swollen; can tolerate 5 mg doses, however    Past Medical History:  Diagnosis Date  . Allergy    allergic rhinitis  . Asthma    mostly in childhood  . BPH (benign prostatic  hypertrophy)   . Heart murmur   . History of kidney stones   . Hypertension   . Hypertriglyceridemia   . Hypothyroidism   . Thyroid disease    hypothyroidism    Past Surgical History:  Procedure Laterality Date  . CARDIAC CATHETERIZATION  2006   [place]  . CARDIOVASCULAR STRESS TEST  2006   [place]  . CHOLECYSTECTOMY  2006  . CYSTOSCOPY W/ URETERAL STENT PLACEMENT  05/24/2017   Procedure: left;  Surgeon: Nickie Retort, MD;  Location: [facility];  Service: Urology;;  . Consuela Mimes W/ URETERAL STENT PLACEMENT Left 06/11/2017   Procedure: CYSTOSCOPY WITH STENT REPLACEMENT;  Surgeon: Hollice Espy, MD;  Location: [facility];  Service: Urology;  Laterality: Left;  . CYSTOSCOPY WITH BIOPSY Left 06/11/2017   Procedure: RENAL PELVIS TUMOR WITH BIOPSY WITH POSSIBLE LASER ABLATION;  Surgeon: Hollice Espy, MD;  Location: [facility];  Service: Urology;  Laterality: Left;  . CYSTOSCOPY WITH STENT PLACEMENT Left 05/24/2017   Procedure: , cystoscopy,left ureteral stent placement and left ureteroscopy attempted, retrograde pylogram;  Surgeon: Nickie Retort, MD;  Location: [facility];  Service: Urology;  Laterality: Left;  . EYE SURGERY Bilateral    Cataract Extraction with IOL  . URETEROSCOPY WITH HOLMIUM LASER LITHOTRIPSY Left 06/11/2017   Procedure: URETEROSCOPY WITH HOLMIUM LASER LITHOTRIPSY;  Surgeon: Hollice Espy,  MD;  Location: [facility];  Service: Urology;  Laterality: Left;  Marland Kitchen VEIN LIGATION AND STRIPPING Bilateral 1997    Family History  Problem Relation Age of Onset  . Alcohol abuse Father   . Prostate cancer Father   . Alcohol abuse Mother   . Asthma Mother   . Liver cancer Paternal Grandmother   . Colon cancer Neg Hx   . Coronary artery disease Neg Hx   . Hypertension Neg Hx   . Diabetes Neg Hx   . Bladder Cancer Neg Hx   . Kidney cancer Neg Hx     Social History   Social History  . Marital status: Married    Spouse name: Girard Koontz  . Number of children: 2  .  Years of education: N/A   Occupational History  . 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 Commercial real estate    Social History Main Topics  . Smoking status: Former Smoker    Types: Cigarettes    Quit date: 11/26/1978  . Smokeless tobacco: Never Used     Comment: social smoked till 1980's  . Alcohol use No     Comment: none since 1984  . Drug use: No  . Sexual activity: Not on file   Other Topics Concern  . Not on file   Social History Narrative  . No narrative on file   Review of Systems Appetite is fine No nasal congestion No ear pain No sore throat    Objective:   Physical Exam  Constitutional: He appears well-nourished. No distress.  HENT:  Mouth/Throat: Oropharynx is clear and moist. No oropharyngeal exudate.  No sinus tenderness TMs normal  Neck: No thyromegaly present.  Pulmonary/Chest: Effort normal and breath sounds normal. No respiratory distress. He has no wheezes. He has no rales.  Lymphadenopathy:    He has no cervical adenopathy.          Assessment & Plan:

## 2017-09-09 NOTE — Patient Instructions (Signed)
Please use the advair twice a day through the winter (can stop at end of February or about then). Let me know if the cough persists even on the advair--I will set you up to see a lung specialist.

## 2017-10-18 ENCOUNTER — Other Ambulatory Visit: Payer: Self-pay | Admitting: Internal Medicine

## 2017-10-18 DIAGNOSIS — I10 Essential (primary) hypertension: Secondary | ICD-10-CM

## 2017-11-07 ENCOUNTER — Other Ambulatory Visit: Payer: Self-pay | Admitting: Family Medicine

## 2017-12-26 ENCOUNTER — Ambulatory Visit: Payer: Self-pay | Admitting: Internal Medicine

## 2017-12-26 ENCOUNTER — Ambulatory Visit (INDEPENDENT_AMBULATORY_CARE_PROVIDER_SITE_OTHER): Payer: Self-pay | Admitting: Internal Medicine

## 2017-12-26 ENCOUNTER — Encounter: Payer: Self-pay | Admitting: Internal Medicine

## 2017-12-26 VITALS — BP 112/52 | HR 52 | Temp 97.7°F | Ht 66.0 in | Wt 154.5 lb

## 2017-12-26 DIAGNOSIS — Z0000 Encounter for general adult medical examination without abnormal findings: Secondary | ICD-10-CM

## 2017-12-26 DIAGNOSIS — J4520 Mild intermittent asthma, uncomplicated: Secondary | ICD-10-CM

## 2017-12-26 DIAGNOSIS — Q231 Congenital insufficiency of aortic valve: Secondary | ICD-10-CM

## 2017-12-26 DIAGNOSIS — E039 Hypothyroidism, unspecified: Secondary | ICD-10-CM

## 2017-12-26 DIAGNOSIS — Z23 Encounter for immunization: Secondary | ICD-10-CM

## 2017-12-26 DIAGNOSIS — Q2381 Bicuspid aortic valve: Secondary | ICD-10-CM

## 2017-12-26 NOTE — Progress Notes (Signed)
Subjective:    Patient ID: Jeffrey Tucker, male    DOB: [birth year], 62 y.o.   MRN: [id number]  HPI Here for physical  Reviewed urology status Had stone and abnormal urothelial area---which biopsy was negative for No further pain Will have follow up CT  Decided to take control of lifestyle Has lost 10#  Walks regularly and some other things (planks, etc). Discussed resistance exercise  Echo this year showed no change in ascending aortic aneurysm  Some right shoulder pain for 6-8 weeks No known injury No meds for this---"not that bad"  Current Outpatient Medications on File Prior to Visit  Medication Sig Dispense Refill  . albuterol (PROAIR HFA) 108 (90 Base) MCG/ACT inhaler Inhale 2 puffs into the lungs 3 (three) times daily as needed. Asthma flare up (Patient taking differently: Inhale 2 puffs into the lungs 3 (three) times daily as needed for wheezing or shortness of breath. Asthma flare up) 1 Inhaler 2  . amLODipine (NORVASC) 5 MG tablet Take 1 tablet (5 mg total) by mouth daily. 90 tablet 3  . aspirin 81 MG tablet Take 81 mg by mouth daily.    . cetirizine (ZYRTEC) 10 MG tablet Take 10 mg by mouth daily.    . Fluticasone-Salmeterol (ADVAIR) 100-50 MCG/DOSE AEPB Inhale 1 puff into the lungs daily as needed (when symptomatic).     Marland Kitchen levothyroxine (SYNTHROID, LEVOTHROID) 125 MCG tablet TAKE 1 TABLET BY MOUTH DAILY 90 tablet 0  . lisinopril (PRINIVIL,ZESTRIL) 10 MG tablet TAKE ONE TABLET BY MOUTH ONCE A DAY 90 tablet 0  . Omega-3 Fatty Acids (EQL OMEGA 3 FISH OIL) 1200 MG CAPS Take 2,400 mg by mouth daily.      No current facility-administered medications on file prior to visit.     Allergies  Allergen Reactions  . Tetracycline Hcl Other (See Comments)    Joint pain (and stiffened them, also)  . Amlodipine Swelling    Lower extremities and body became swollen; can tolerate 5 mg doses, however    Past Medical History:  Diagnosis Date  . Allergy    allergic rhinitis  .  Asthma    mostly in childhood  . BPH (benign prostatic hypertrophy)   . Heart murmur   . History of kidney stones   . Hypertension   . Hypertriglyceridemia   . Hypothyroidism   . Thyroid disease    hypothyroidism    Past Surgical History:  Procedure Laterality Date  . CARDIAC CATHETERIZATION  2006   [place]  . CARDIOVASCULAR STRESS TEST  2006   [place]  . CHOLECYSTECTOMY  2006  . CYSTOSCOPY W/ URETERAL STENT PLACEMENT  05/24/2017   Procedure: left;  Surgeon: Nickie Retort, MD;  Location: [facility];  Service: Urology;;  . Consuela Mimes W/ URETERAL STENT PLACEMENT Left 06/11/2017   Procedure: CYSTOSCOPY WITH STENT REPLACEMENT;  Surgeon: Hollice Espy, MD;  Location: [facility];  Service: Urology;  Laterality: Left;  . CYSTOSCOPY WITH BIOPSY Left 06/11/2017   Procedure: RENAL PELVIS TUMOR WITH BIOPSY WITH POSSIBLE LASER ABLATION;  Surgeon: Hollice Espy, MD;  Location: [facility];  Service: Urology;  Laterality: Left;  . CYSTOSCOPY WITH STENT PLACEMENT Left 05/24/2017   Procedure: , cystoscopy,left ureteral stent placement and left ureteroscopy attempted, retrograde pylogram;  Surgeon: Nickie Retort, MD;  Location: [facility];  Service: Urology;  Laterality: Left;  . EYE SURGERY Bilateral    Cataract Extraction with IOL  . URETEROSCOPY WITH HOLMIUM LASER LITHOTRIPSY Left 06/11/2017   Procedure: URETEROSCOPY WITH  HOLMIUM LASER LITHOTRIPSY;  Surgeon: Hollice Espy, MD;  Location: [facility];  Service: Urology;  Laterality: Left;  Marland Kitchen VEIN LIGATION AND STRIPPING Bilateral 1997    Family History  Problem Relation Age of Onset  . Alcohol abuse Father   . Prostate cancer Father   . Alcohol abuse Mother   . Asthma Mother   . Liver cancer Paternal Grandmother   . Colon cancer Neg Hx   . Coronary artery disease Neg Hx   . Hypertension Neg Hx   . Diabetes Neg Hx   . Bladder Cancer Neg Hx   . Kidney cancer Neg Hx     Social History   Socioeconomic History  . Marital status:  Married    Spouse name: Mamoudou Mulvehill  . Number of children: 2  . Years of education: Not on file  . Highest education level: Not on file  Social Needs  . Financial resource strain: Not on file  . Food insecurity - worry: Not on file  . Food insecurity - inability: Not on file  . Transportation needs - medical: Not on file  . Transportation needs - non-medical: Not on file  Occupational History  . Occupation: Sales promotion account executive  . 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 Occupation: Adult nurse estate  Tobacco Use  . Smoking status: Former Smoker    Types: Cigarettes    Last attempt to quit: 11/26/1978    Years since quitting: 39.1  . Smokeless tobacco: Never Used  . Tobacco comment: social smoked till 52's  Substance and Sexual Activity  . Alcohol use: No    Alcohol/week: 0.0 oz    Comment: none since 1984  . Drug use: No  . Sexual activity: Not on file  Other Topics Concern  . Not on file  Social History Narrative  . Not on file    Review of Systems  Constitutional: Negative for fatigue.       Wears seat belt  HENT: Positive for hearing loss. Negative for tinnitus.        Keeps up with dentist  Eyes: Negative for visual disturbance.       No diplopia or unilateral vision loss  Respiratory: Positive for cough and wheezing. Negative for chest tightness and shortness of breath.        Using inhaler more ---advair is too expensive to use daily  Cardiovascular: Positive for palpitations. Negative for chest pain and leg swelling.       Notices heart in bed occasionally  Gastrointestinal: Negative for abdominal pain, blood in stool and constipation.       No heartburn  Endocrine: Negative for polydipsia and polyuria.  Genitourinary: Negative for frequency and urgency.       Slightly slower stream No sexual problems  Musculoskeletal: Negative for arthralgias, back pain and joint swelling.  Skin: Negative for rash.       No  suspicious lesions  Allergic/Immunologic: Positive for environmental allergies. Negative for immunocompromised state.  Neurological: Negative for dizziness, syncope, light-headedness and headaches.  Hematological: Negative for adenopathy. Does not bruise/bleed easily.  Psychiatric/Behavioral: Negative for dysphoric mood and sleep disturbance. The patient is not nervous/anxious.        Objective:   Physical Exam  Constitutional: He appears well-developed. No distress.  HENT:  Head: Normocephalic and atraumatic.  Right Ear: External ear normal.  Left Ear: External ear normal.  Mouth/Throat: Oropharynx is clear and moist. No oropharyngeal exudate.  Neck: No thyromegaly  present.  Cardiovascular: Normal rate, regular rhythm and intact distal pulses. Exam reveals no gallop.  Gr 2/6 aortic systolic murmur with loud diastolic murmur  Pulmonary/Chest: Effort normal and breath sounds normal. No respiratory distress. He has no wheezes. He has no rales.  Abdominal: Soft. There is no tenderness.  Musculoskeletal: He exhibits no edema or tenderness.  Lymphadenopathy:    He has no cervical adenopathy.  Skin: No rash noted. No erythema.  Psychiatric: He has a normal mood and affect. His behavior is normal.          Assessment & Plan:

## 2017-12-26 NOTE — Assessment & Plan Note (Signed)
With stenosis and regurg Ascending aneurysm Keeps up with cardiology

## 2017-12-26 NOTE — Assessment & Plan Note (Signed)
Clinically euthryoid Will defer labs to next year

## 2017-12-26 NOTE — Assessment & Plan Note (Signed)
Due for Td today Colon due 2023 Defer PSA to next year at least Had flu shot

## 2017-12-26 NOTE — Addendum Note (Signed)
Addended by: Modena Nunnery on: 12/26/2017 03:16 PM   Modules accepted: Orders

## 2017-12-26 NOTE — Assessment & Plan Note (Signed)
Mostly controlled Urged him to use the advair regularly during his bad allergy times

## 2017-12-27 ENCOUNTER — Encounter: Payer: Self-pay | Admitting: Cardiovascular Disease

## 2017-12-27 ENCOUNTER — Ambulatory Visit: Payer: Self-pay | Admitting: Cardiovascular Disease

## 2017-12-27 VITALS — BP 126/24 | HR 52 | Ht 66.0 in | Wt 153.5 lb

## 2017-12-27 DIAGNOSIS — Q2381 Bicuspid aortic valve: Secondary | ICD-10-CM

## 2017-12-27 DIAGNOSIS — I77810 Thoracic aortic ectasia: Secondary | ICD-10-CM

## 2017-12-27 DIAGNOSIS — Q231 Congenital insufficiency of aortic valve: Secondary | ICD-10-CM

## 2017-12-27 DIAGNOSIS — I359 Nonrheumatic aortic valve disorder, unspecified: Secondary | ICD-10-CM

## 2017-12-27 DIAGNOSIS — I351 Nonrheumatic aortic (valve) insufficiency: Secondary | ICD-10-CM

## 2017-12-27 DIAGNOSIS — E781 Pure hyperglyceridemia: Secondary | ICD-10-CM

## 2017-12-27 DIAGNOSIS — I10 Essential (primary) hypertension: Secondary | ICD-10-CM

## 2017-12-27 MED ORDER — LOSARTAN POTASSIUM 25 MG PO TABS: 25.0000 mg | ORAL_TABLET | Freq: Every day | ORAL | 3 refills | Status: DC

## 2017-12-27 NOTE — Patient Instructions (Signed)
Medication Instructions:   Stop the lisinopril and amlodipine Start losartan 25 mg daily  Labwork:  No new labs needed  Testing/Procedures:  Echocardiogram in 11/2018 for aortic valve disease, regurgitation   Follow-Up: It was a pleasure seeing you in the office today. Please call us if you have new issues that need to be addressed before your next appt.  [phone]  Your physician wants you to follow-up in: 12 months, after echo 11/2018  You will receive a reminder letter in the mail two months in advance. If you don't receive a letter, please call our office to schedule the follow-up appointment.  If you need a refill on your cardiac medications before your next appointment, please call your pharmacy.

## 2017-12-27 NOTE — Progress Notes (Signed)
Cardiology Office Note  Date:  12/27/2017   ID:  Jeffrey Tucker, DOB August 17, 1956, MRN [id number]  PCP:  Venia Carbon, MD   Chief Complaint  Patient presents with  . other    12 month f/u no complaints today. Meds reviewed verbally with pt.    HPI:  Jeffrey Tucker is a very pleasant 62 year old gentleman with  moderate aortic valve regurgitation, mild to moderately dilated aortic root 4.1 cm  ascending aorta 4.2 cm,  possible bicuspid aortic valve.  He presents for routine followup of his aortic valve regurgitation  In follow-up he reports that he feels well, no shortness of breath, no fluid retention abdominal bloating or ankle swelling Reports that he has good exercise tolerance No longer rides his bike or runs, only walks for exercise Dramatic change in his diet Lost weight, 10 pounds over the past several months  Would like to come off some of his medications  Previous echocardiograms reviewed with him in detail Last echocardiogram for over 2018 with no significant change in size of aortic root, or ascending aorta He did have dilated left ventricle Previously had normal LV size  EKG personally reviewed by myself on todays visit Shows normal sinus rhythm rate 52 bpm LVH  Other past medical history  echocardiogram in 2014 showed at least moderate aortic valve insufficiency, no dramatic change in his aortic root or ascending aorta. Still mild to moderately dilated, 4.1 cm.(Same as 2013) He has not had an echocardiogram this year but this will be scheduled for today at his request.   PMH:   has a past medical history of Allergy, Asthma, BPH (benign prostatic hypertrophy), Heart murmur, History of kidney stones, Hypertension, Hypertriglyceridemia, Hypothyroidism, and Thyroid disease.  PSH:    Past Surgical History:  Procedure Laterality Date  . CARDIAC CATHETERIZATION  2006   [place]  . CARDIOVASCULAR STRESS TEST  2006   [place]  . CHOLECYSTECTOMY  2006  . CYSTOSCOPY W/  URETERAL STENT PLACEMENT  05/24/2017   Procedure: left;  Surgeon: Nickie Retort, MD;  Location: [facility];  Service: Urology;;  . Consuela Mimes W/ URETERAL STENT PLACEMENT Left 06/11/2017   Procedure: CYSTOSCOPY WITH STENT REPLACEMENT;  Surgeon: Hollice Espy, MD;  Location: [facility];  Service: Urology;  Laterality: Left;  . CYSTOSCOPY WITH BIOPSY Left 06/11/2017   Procedure: RENAL PELVIS TUMOR WITH BIOPSY WITH POSSIBLE LASER ABLATION;  Surgeon: Hollice Espy, MD;  Location: [facility];  Service: Urology;  Laterality: Left;  . CYSTOSCOPY WITH STENT PLACEMENT Left 05/24/2017   Procedure: , cystoscopy,left ureteral stent placement and left ureteroscopy attempted, retrograde pylogram;  Surgeon: Nickie Retort, MD;  Location: [facility];  Service: Urology;  Laterality: Left;  . EYE SURGERY Bilateral    Cataract Extraction with IOL  . URETEROSCOPY WITH HOLMIUM LASER LITHOTRIPSY Left 06/11/2017   Procedure: URETEROSCOPY WITH HOLMIUM LASER LITHOTRIPSY;  Surgeon: Hollice Espy, MD;  Location: [facility];  Service: Urology;  Laterality: Left;  Marland Kitchen VEIN LIGATION AND STRIPPING Bilateral 1997    Current Outpatient Medications  Medication Sig Dispense Refill  . albuterol (PROAIR HFA) 108 (90 Base) MCG/ACT inhaler Inhale 2 puffs into the lungs 3 (three) times daily as needed. Asthma flare up (Patient taking differently: Inhale 2 puffs into the lungs 3 (three) times daily as needed for wheezing or shortness of breath. Asthma flare up) 1 Inhaler 2  . aspirin 81 MG tablet Take 81 mg by mouth daily.    . cetirizine (ZYRTEC) 10 MG tablet Take 10  mg by mouth daily.    . Fluticasone-Salmeterol (ADVAIR) 100-50 MCG/DOSE AEPB Inhale 1 puff into the lungs daily as needed (when symptomatic).     Marland Kitchen levothyroxine (SYNTHROID, LEVOTHROID) 125 MCG tablet TAKE 1 TABLET BY MOUTH DAILY 90 tablet 0  . Omega-3 Fatty Acids (EQL OMEGA 3 FISH OIL) 1200 MG CAPS Take 2,400 mg by mouth daily.     Marland Kitchen losartan (COZAAR) 25 MG tablet Take  1 tablet (25 mg total) by mouth daily. 90 tablet 3   No current facility-administered medications for this visit.      Allergies:   Tetracycline hcl and Amlodipine   Social History:  The patient  reports that he quit smoking about 39 years ago. His smoking use included cigarettes. he has never used smokeless tobacco. He reports that he does not drink alcohol or use drugs.   Family History:   family history includes Alcohol abuse in his father and mother; Asthma in his mother; Liver cancer in his paternal grandmother; Prostate cancer in his father.    Review of Systems: Review of Systems  Constitutional: Positive for weight loss.  Respiratory: Negative.   Cardiovascular: Negative.   Gastrointestinal: Negative.   Musculoskeletal: Negative.   Neurological: Negative.   Psychiatric/Behavioral: Negative.   All other systems reviewed and are negative.    PHYSICAL EXAM: VS:  BP (!) 126/24 (BP Location: Left Arm, Patient Position: Sitting, Cuff Size: Normal)   Pulse (!) 52   Ht 5\' 6"  (1.676 m)   Wt 153 lb 8 oz (69.6 kg)   BMI 24.78 kg/m  , BMI Body mass index is 24.78 kg/m. Constitutional:  oriented to person, place, and time. No distress.  HENT:  Head: Normocephalic and atraumatic.  Eyes:  no discharge. No scleral icterus.  Neck: Normal range of motion. Neck supple. No JVD present.  Cardiovascular: Cardiac: RRR; 5-3/2 systolic and diastolic murmur right sternal border,  no rubs, or gallops,no edema  Pulmonary/Chest: Effort normal and breath sounds normal. No stridor. No respiratory distress.  no wheezes.  no rales.  no tenderness.  Abdominal: Soft.  no distension.  no tenderness.  Musculoskeletal: Normal range of motion.  no  tenderness or deformity.  Neurological:  normal muscle tone. Coordination normal. No atrophy Skin: Skin is warm and dry. No rash noted. not diaphoretic.  Psychiatric:  normal mood and affect. behavior is normal. Thought content normal.      Recent  Labs: 05/28/2017: ALT 21; BUN 19; Creatinine, Ser 1.16; Hemoglobin 13.2; Platelets 246; Potassium 3.9; Sodium 136    Lipid Panel Lab Results  Component Value Date   CHOL 189 09/28/2016   HDL 33.20 (L) 09/28/2016   LDLCALC 103 (H) 05/26/2012   TRIG (H) 09/28/2016    436.0 Triglyceride is over 400; calculations on Lipids are invalid.      Wt Readings from Last 3 Encounters:  12/27/17 153 lb 8 oz (69.6 kg)  12/26/17 154 lb 8 oz (70.1 kg)  09/09/17 162 lb (73.5 kg)       ASSESSMENT AND PLAN:  Bradycardia - Plan: EKG 12-Lead Asymptomatic bradycardia, no further workup  Bicuspid aortic valve - Plan: EKG 12-Lead Seen on echo, echo ordered for early 2020 at his request He does not want echo this year as he is asymptomatic  Aortic valve insufficiency, etiology of cardiac valve disease unspecified - Plan: EKG 12-Lead Moderate aortic valve regurgitation Discussed dilation of left ventricle on previous echocardiogram Also discussed low diastolic pressures today confirmed by myself, diastolic of  30  Ascending aorta dilatation (HCC) - Plan: EKG 12- No significant change on echo February 2018, no change over the past few years   Essential hypertension Recommend he stop lisinopril and amlodipine start losartan 25   Total encounter time more than 25 minutes  Greater than 50% was spent in counseling and coordination of care with the patient   Disposition:   F/U  12 months, echo prior to visit   Orders Placed This Encounter  Procedures  . ECHOCARDIOGRAM COMPLETE     Signed, Jeffrey Tucker, M.D., Ph.D. 12/27/2017  [place], [place]

## 2017-12-31 ENCOUNTER — Ambulatory Visit
Admission: RE | Admit: 2017-12-31 | Discharge: 2017-12-31 | Disposition: A | Discharge status: Home / Self Care | Payer: Self-pay | Source: Ambulatory Visit | Attending: Urology | Admitting: Urology

## 2017-12-31 DIAGNOSIS — I700 Atherosclerosis of aorta: Secondary | ICD-10-CM | POA: Insufficient documentation

## 2017-12-31 DIAGNOSIS — N2889 Other specified disorders of kidney and ureter: Secondary | ICD-10-CM | POA: Insufficient documentation

## 2017-12-31 DIAGNOSIS — N281 Cyst of kidney, acquired: Secondary | ICD-10-CM | POA: Insufficient documentation

## 2017-12-31 DIAGNOSIS — D4112 Neoplasm of uncertain behavior of left renal pelvis: Secondary | ICD-10-CM | POA: Insufficient documentation

## 2017-12-31 DIAGNOSIS — I517 Cardiomegaly: Secondary | ICD-10-CM | POA: Insufficient documentation

## 2017-12-31 LAB — POCT I-STAT CREATININE: CREATININE: 0.9 mg/dL (ref 0.61–1.24)

## 2017-12-31 MED ORDER — IOPAMIDOL (ISOVUE-300) INJECTION 61%
125.0000 mL | Freq: Once | INTRAVENOUS | Status: AC | PRN
  Administered 2017-12-31: 125 mL via INTRAVENOUS

## 2018-01-01 NOTE — Addendum Note (Signed)
Addended by: Britt Bottom on: 01/01/2018 03:25 PM   Modules accepted: Orders

## 2018-01-17 ENCOUNTER — Encounter: Payer: Self-pay | Admitting: Internal Medicine

## 2018-01-20 ENCOUNTER — Encounter: Payer: Self-pay | Admitting: Urology

## 2018-01-29 ENCOUNTER — Ambulatory Visit (INDEPENDENT_AMBULATORY_CARE_PROVIDER_SITE_OTHER): Payer: Self-pay | Admitting: Urology

## 2018-01-29 ENCOUNTER — Encounter: Payer: Self-pay | Admitting: Urology

## 2018-01-29 VITALS — BP 145/66 | HR 56 | Ht 66.0 in | Wt 155.0 lb

## 2018-01-29 DIAGNOSIS — D3012 Benign neoplasm of left renal pelvis: Secondary | ICD-10-CM

## 2018-01-29 DIAGNOSIS — N200 Calculus of kidney: Secondary | ICD-10-CM

## 2018-01-30 ENCOUNTER — Other Ambulatory Visit: Payer: Self-pay | Admitting: Internal Medicine

## 2018-02-10 ENCOUNTER — Encounter: Payer: Self-pay | Admitting: Internal Medicine

## 2018-02-10 ENCOUNTER — Encounter: Payer: Self-pay | Admitting: Cardiovascular Disease

## 2018-02-10 ENCOUNTER — Encounter: Payer: Self-pay | Admitting: Urology

## 2018-02-10 NOTE — Progress Notes (Signed)
01/29/2018 8:29 AM   Jeffrey Tucker 05-15-1956 [id number]  Referring provider: Venia Carbon, MD [address] [place], [place] [address]  Chief Complaint  Patient presents with  . Renal Lesion    6 month w/CTscan results    HPI: 62 year old male with a history of left proximal urothelial enhancement who returns today for follow-up imaging.  He initially presented in 03/2017 with left proximal ureteral enhancement felt to be original infection. He underwent follow up imaging which showed persistent change concerning for possible TCC of the left upper tract.  He was taken to the operating room for staged ureteroscopy including treatment of  He underwent staged ureteroscopy including treatment of the stone nonobstructing left lower pole stone (midpole stone unable to be identified within the collecting system) and renal pelvic biopsies. Intraoperatively, the renal pelvis was noted to be edematous without obvious papillary change. Biopsy was consistent with urothelial hyperplasia and inflammation.  Stone analysis shows 30% calcium oxalate dihydrate, 65% calcium oxalate monohydrate, and 5% calcium phosphate.  No previous stone episodes.   He returns today for repeat serial imaging as a precaution.  CT urogram on 12/31/2017 shows some mild residual wall thickening of the left renal pelvis and proximal ureter without any evidence of worrisome progression.  There were additional non-urologic findings including moderate cardiomegaly, airway changes, atherosclerosis, bone island which the patient wishes to discuss today.  There is also a 6 mm left mid kidney calcification, presumably parenchymal.  He is originally from [place].  He does not recall any infectious disease or TB exposure.  He denies any flank pain or gross hematuria.  No fevers or chills.  No weight loss.  He is doing otherwise well.  He has no urologic complaints today.  PMH: Past Medical History:  Diagnosis Date  .  Allergy    allergic rhinitis  . Asthma    mostly in childhood  . BPH (benign prostatic hypertrophy)   . Heart murmur   . History of kidney stones   . Hypertension   . Hypertriglyceridemia   . Hypothyroidism   . Thyroid disease    hypothyroidism    Surgical History: Past Surgical History:  Procedure Laterality Date  . CARDIAC CATHETERIZATION  2006   [place]  . CARDIOVASCULAR STRESS TEST  2006   [place]  . CHOLECYSTECTOMY  2006  . CYSTOSCOPY W/ URETERAL STENT PLACEMENT  05/24/2017   Procedure: left;  Surgeon: Nickie Retort, MD;  Location: [facility];  Service: Urology;;  . Consuela Mimes W/ URETERAL STENT PLACEMENT Left 06/11/2017   Procedure: CYSTOSCOPY WITH STENT REPLACEMENT;  Surgeon: Hollice Espy, MD;  Location: [facility];  Service: Urology;  Laterality: Left;  . CYSTOSCOPY WITH BIOPSY Left 06/11/2017   Procedure: RENAL PELVIS TUMOR WITH BIOPSY WITH POSSIBLE LASER ABLATION;  Surgeon: Hollice Espy, MD;  Location: [facility];  Service: Urology;  Laterality: Left;  . CYSTOSCOPY WITH STENT PLACEMENT Left 05/24/2017   Procedure: , cystoscopy,left ureteral stent placement and left ureteroscopy attempted, retrograde pylogram;  Surgeon: Nickie Retort, MD;  Location: [facility];  Service: Urology;  Laterality: Left;  . EYE SURGERY Bilateral    Cataract Extraction with IOL  . URETEROSCOPY WITH HOLMIUM LASER LITHOTRIPSY Left 06/11/2017   Procedure: URETEROSCOPY WITH HOLMIUM LASER LITHOTRIPSY;  Surgeon: Hollice Espy, MD;  Location: [facility];  Service: Urology;  Laterality: Left;  Marland Kitchen VEIN LIGATION AND STRIPPING Bilateral 1997    Home Medications:  Allergies as of 01/29/2018      Reactions  Tetracycline Hcl Other (See Comments)   Joint pain (and stiffened them, also)   Amlodipine Swelling   Lower extremities and body became swollen; can tolerate 5 mg doses, however      Medication List        Accurate as of 01/29/18 11:59 PM. Always use your most recent med list.            albuterol 108 (90 Base) MCG/ACT inhaler Commonly known as:  PROAIR HFA Inhale 2 puffs into the lungs 3 (three) times daily as needed. Asthma flare up   aspirin 81 MG tablet Take 81 mg by mouth daily.   cetirizine 10 MG tablet Commonly known as:  ZYRTEC Take 10 mg by mouth daily.   EQL OMEGA 3 FISH OIL 1200 MG Caps Take 2,400 mg by mouth daily.   Fluticasone-Salmeterol 100-50 MCG/DOSE Aepb Commonly known as:  ADVAIR Inhale 1 puff into the lungs daily as needed (when symptomatic).   levothyroxine 125 MCG tablet Commonly known as:  SYNTHROID, LEVOTHROID TAKE 1 TABLET BY MOUTH DAILY   losartan 25 MG tablet Commonly known as:  COZAAR Take 1 tablet (25 mg total) by mouth daily.       Allergies:  Allergies  Allergen Reactions  . Tetracycline Hcl Other (See Comments)    Joint pain (and stiffened them, also)  . Amlodipine Swelling    Lower extremities and body became swollen; can tolerate 5 mg doses, however    Family History: Family History  Problem Relation Age of Onset  . Alcohol abuse Father   . Prostate cancer Father   . Alcohol abuse Mother   . Asthma Mother   . Liver cancer Paternal Grandmother   . Colon cancer Neg Hx   . Coronary artery disease Neg Hx   . Hypertension Neg Hx   . Diabetes Neg Hx   . Bladder Cancer Neg Hx   . Kidney cancer Neg Hx     Social History:  reports that he quit smoking about 39 years ago. His smoking use included cigarettes. he has never used smokeless tobacco. He reports that he does not drink alcohol or use drugs.  ROS: UROLOGY Frequent Urination?: No Hard to postpone urination?: No Burning/pain with urination?: No Get up at night to urinate?: No Leakage of urine?: No Urine stream starts and stops?: No Trouble starting stream?: No Do you have to strain to urinate?: No Blood in urine?: No Urinary tract infection?: No Sexually transmitted disease?: No Injury to kidneys or bladder?: No Painful intercourse?: No Weak  stream?: No Erection problems?: No Penile pain?: No  Gastrointestinal Nausea?: No Vomiting?: No Indigestion/heartburn?: No Diarrhea?: No Constipation?: No  Constitutional Fever: No Night sweats?: No Weight loss?: No Fatigue?: No  Skin Skin rash/lesions?: No Itching?: No  Eyes Blurred vision?: No Double vision?: No  Ears/Nose/Throat Sore throat?: No Sinus problems?: No  Hematologic/Lymphatic Swollen glands?: No Easy bruising?: No  Cardiovascular Leg swelling?: No Chest pain?: No  Respiratory Cough?: No Shortness of breath?: No  Endocrine Excessive thirst?: No  Musculoskeletal Back pain?: No Joint pain?: No  Neurological Headaches?: No Dizziness?: No  Psychologic Depression?: No Anxiety?: No  Physical Exam: BP (!) 145/66   Pulse (!) 56   Ht 5\' 6"  (1.676 m)   Wt 155 lb (70.3 kg)   BMI 25.02 kg/m   Constitutional:  Alert and oriented, No acute distress. HEENT: Gray AT, moist mucus membranes.  Trachea midline, no masses. Cardiovascular: No clubbing, cyanosis, or edema. Respiratory: Normal respiratory  effort, no increased work of breathing. GU: No CVA tenderness. Skin: No rashes, bruises or suspicious lesions. Neurologic: Grossly intact, no focal deficits, moving all 4 extremities. Psychiatric: Normal mood and affect.  Laboratory Data: Lab Results  Component Value Date   WBC 11.3 (H) 05/28/2017   HGB 13.2 05/28/2017   HCT 39.5 05/28/2017   MCV 91.4 05/28/2017   PLT 246 05/28/2017    Lab Results  Component Value Date   CREATININE 0.90 12/31/2017    Lab Results  Component Value Date   PSA 0.46 09/28/2016   PSA 0.56 09/20/2015   PSA 0.33 09/09/2013    Lab Results  Component Value Date   TESTOSTERONE 295 (L) 09/20/2015   Urinalysis N/a  Pertinent Imaging: CLINICAL DATA:  Urinary tract calculi, previous enhancement along the left proximal urothelium indicated hyperplasia and inflammation.  EXAM: CT ABDOMEN AND PELVIS  WITHOUT AND WITH CONTRAST  TECHNIQUE: Multidetector CT imaging of the abdomen and pelvis was performed following the standard protocol before and following the bolus administration of intravenous contrast.  CONTRAST:  160mL ISOVUE-300 IOPAMIDOL (ISOVUE-300) INJECTION 61%  COMPARISON:  05/28/2017  FINDINGS: Lower chest: Moderate cardiomegaly most notably involving the left ventricle. Mild bilateral airway thickening.  Hepatobiliary: 0.4 by 0.3 cm hypodensity in segment 3 of the liver on image 17/1.  Cholecystectomy.  The common bile duct measures 9 mm in diameter.  Pancreas: Unremarkable  Spleen: Unremarkable  Adrenals/Urinary Tract: Adrenal glands normal. Right renal cysts again observed with the larger a right posterolateral 4.3 by 6.1 cm cyst.  Left mid kidney 0.6 cm calcification is shown on image 55/10 along the margin of the renal parenchyma. On images 43-44 of series 15 this appears to be along the distal margin of a calyx and is probably a true renal calculus, less likely to be a dystrophic renal calcification outside of the calyceal system. This midpole calcification reportedly could not be seen under prior ureteroscopy.  There is poor filling of the left proximal ureter on delayed phase images with little if any accentuation in portal venous phase enhancement along the segment that is not fill well. Conceivably there could be some low-grade inflammation contributing to the poor filling in the proximal 9.8 cm of the left ureter. There is also some suggestion of mild thickening of the collecting system on image 44/15. The previous left-sided stent has been removed.  Stomach/Bowel: Unremarkable  Vascular/Lymphatic: Aortoiliac atherosclerotic vascular disease.  Reproductive: Unremarkable  Other: No supplemental non-categorized findings.  Musculoskeletal: Stable dense sclerotic lesion in the right iliac bone on image 56/4, favoring a bone  island.  IMPRESSION: 1. 6 mm left mid kidney calcification along the parenchymal margin. This is also along the margin of a calyx in the left mid kidney but is not demonstrated to be definitely within the calyx. Reportedly this was difficult to visualize at ureteroscopy. The could be a parenchymal calcification rather than necessarily being a nonobstructive renal calculus. 2. There is some mild residual wall thickening in the left renal collecting system and proximal ureter. Previous biopsy revealed benign inflammation. No worrisome progression. 3. Other imaging findings of potential clinical significance: Moderate cardiomegaly. Airway thickening is present, suggesting bronchitis or reactive airways disease. Right renal cysts. Aortic Atherosclerosis (ICD10-I70.0). Bone island in the right iliac bone.   Electronically Signed   By: Van Clines M.D.   On: 12/31/2017 12:34  CT scan was personally reviewed today and with the patient.  This is also compared to multiple previous other scans from 05/2017  and 04/2017.  Assessment & Plan:    1. Benign neoplasm of left renal pelvis S/p biopsy 2018 of left renal pelvic thickening- inflammation ? underlying focal infectious process Persistent changes with some improvement, no progression which is reassuring Surveillance options discussed, would recommend one final CT scan in 1 year, if stable or improved, no further imaging as this is likely benign process  2. Left nephrolithiasis 6 mm with left calcification, likely parenchymal stone was unable to be identified on ureteroscopy We will follow with serial imaging Asymptomatic   - CT HEMATURIA WORKUP; Future   Return in about 1 year (around 01/30/2019) for f/u CT urogram.  Hollice Espy, MD  [facility] [address], [place], [place] [address] [phone]

## 2018-02-11 ENCOUNTER — Other Ambulatory Visit: Payer: Self-pay | Admitting: Internal Medicine

## 2018-03-29 ENCOUNTER — Encounter: Payer: Self-pay | Admitting: Cardiovascular Disease

## 2018-04-06 ENCOUNTER — Encounter: Payer: Self-pay | Admitting: Cardiovascular Disease

## 2018-04-09 ENCOUNTER — Other Ambulatory Visit: Payer: Self-pay | Admitting: *Deleted

## 2018-04-09 MED ORDER — LOSARTAN POTASSIUM 25 MG PO TABS: 75.0000 mg | ORAL_TABLET | Freq: Every day | ORAL | 3 refills | Status: DC

## 2018-04-11 ENCOUNTER — Telehealth: Payer: Self-pay | Admitting: Cardiovascular Disease

## 2018-04-11 MED ORDER — LOSARTAN POTASSIUM 100 MG PO TABS: 100.0000 mg | ORAL_TABLET | Freq: Every day | ORAL | 3 refills | Status: DC

## 2018-04-11 NOTE — Telephone Encounter (Signed)
Spoke with patient and reviewed Dr. Donivan Scull recommendations to increase losartan to 100 mg once daily and if needed add back amlodipine. Patient states that he would like to try the losartan first and then if needed he will call back to start other pill. He verbalized understanding, was agreeable with plan, and had no further questions at this time.

## 2018-04-16 ENCOUNTER — Telehealth: Payer: Self-pay | Admitting: Cardiovascular Disease

## 2018-04-16 ENCOUNTER — Other Ambulatory Visit: Payer: Self-pay | Admitting: *Deleted

## 2018-04-16 MED ORDER — LOSARTAN POTASSIUM 100 MG PO TABS: 100.0000 mg | ORAL_TABLET | Freq: Every day | ORAL | 3 refills | Status: DC

## 2018-04-16 NOTE — Telephone Encounter (Signed)
Requested Prescriptions   Signed Prescriptions Disp Refills  . losartan (COZAAR) 100 MG tablet 90 tablet 3    Sig: Take 1 tablet (100 mg total) by mouth daily.    Authorizing Provider: Minna Merritts    Ordering User: Britt Bottom

## 2018-04-16 NOTE — Telephone Encounter (Signed)
°*  STAT* If patient is at the pharmacy, call can be transferred to refill team.   1. Which medications need to be refilled? (please list name of each medication and dose if known) Losartan 100 mg   2. Which pharmacy/location (including street and city if local pharmacy) is medication to be sent to? [facility]   3. Do they need a 30 day or 90 day supply? 90 day

## 2018-11-18 ENCOUNTER — Other Ambulatory Visit: Payer: Self-pay

## 2018-11-18 ENCOUNTER — Other Ambulatory Visit: Payer: Self-pay | Admitting: Internal Medicine

## 2018-11-18 ENCOUNTER — Telehealth: Payer: Self-pay | Admitting: Internal Medicine

## 2018-11-18 MED ORDER — FLUTICASONE-SALMETEROL 100-50 MCG/DOSE IN AEPB: 1.0000 | INHALATION_SPRAY | Freq: Every day | RESPIRATORY_TRACT | 0 refills | Status: DC | PRN

## 2018-11-18 MED ORDER — LEVOTHYROXINE SODIUM 125 MCG PO TABS: 125.0000 ug | ORAL_TABLET | Freq: Every day | ORAL | 1 refills | Status: DC

## 2018-11-18 NOTE — Telephone Encounter (Signed)
Pt called he can get the best price at [address] and request levothyroxine and advair to [facility] [address]. Pt said [facility] said all pts refills were inactivated. Pt last annual 11/2017 and has CPX scheduled 02/2019. Refilled levothyroxine 125 mcg # 90 x 1 and advair # 60. Pt appreciative and voiced understanding.

## 2018-11-18 NOTE — Telephone Encounter (Signed)
Left detailed message for pt advising him the process for transferring rxs was he needs to have [facility]. If he has refills available, [facility]. It can take a few days for them to do it.

## 2018-11-18 NOTE — Telephone Encounter (Signed)
Pt called office stating he needed his prescription transferred due to the pricing at the [facility]. Pt needs the Losartan, Levothyroxine, and Fluticasone sent to [facility] on [address]. Pt is needing the Albuterol sent to [facility] [place].

## 2018-11-20 ENCOUNTER — Telehealth: Payer: Self-pay

## 2018-11-20 ENCOUNTER — Other Ambulatory Visit: Payer: Self-pay

## 2018-11-20 MED ORDER — LOSARTAN POTASSIUM 100 MG PO TABS: 100.0000 mg | ORAL_TABLET | Freq: Every day | ORAL | 3 refills | Status: DC

## 2018-11-20 NOTE — Telephone Encounter (Signed)
*  STAT* If patient is at the pharmacy, call can be transferred to refill team.   1. Which medications need to be refilled? (please list name of each medication and dose if known) LOSARTAN 100 MG  2. Which pharmacy/location (including street and city if local pharmacy) is medication to be sent to? [facility], [place]  3. Do they need a 30 day or 90 day supply? [place]

## 2018-11-20 NOTE — Telephone Encounter (Signed)
Requested Prescriptions   Signed Prescriptions Disp Refills  . losartan (COZAAR) 100 MG tablet 90 tablet 3    Sig: Take 1 tablet (100 mg total) by mouth daily.    Authorizing Provider: Minna Merritts    Ordering User: Janan Ridge

## 2018-12-16 ENCOUNTER — Other Ambulatory Visit: Payer: Self-pay

## 2018-12-31 ENCOUNTER — Ambulatory Visit: Payer: Self-pay | Admitting: Cardiovascular Disease

## 2019-01-01 ENCOUNTER — Encounter: Payer: Self-pay | Admitting: Internal Medicine

## 2019-01-06 ENCOUNTER — Ambulatory Visit: Payer: Self-pay | Admitting: Cardiovascular Disease

## 2019-01-28 ENCOUNTER — Ambulatory Visit: Admission: RE | Admit: 2019-01-28 | Payer: Self-pay | Source: Ambulatory Visit

## 2019-01-29 ENCOUNTER — Other Ambulatory Visit: Payer: Self-pay

## 2019-01-29 ENCOUNTER — Ambulatory Visit
Admission: RE | Admit: 2019-01-29 | Discharge: 2019-01-29 | Disposition: A | Discharge status: Home / Self Care | Payer: Self-pay | Source: Ambulatory Visit | Attending: Urology | Admitting: Urology

## 2019-01-29 DIAGNOSIS — D3012 Benign neoplasm of left renal pelvis: Secondary | ICD-10-CM | POA: Insufficient documentation

## 2019-01-29 LAB — POCT I-STAT CREATININE: Creatinine, Ser: 0.8 mg/dL (ref 0.61–1.24)

## 2019-01-29 MED ORDER — IOHEXOL 300 MG/ML  SOLN
125.0000 mL | Freq: Once | INTRAMUSCULAR | Status: AC | PRN
  Administered 2019-01-29: 125 mL via INTRAVENOUS

## 2019-02-04 ENCOUNTER — Ambulatory Visit (INDEPENDENT_AMBULATORY_CARE_PROVIDER_SITE_OTHER): Payer: Self-pay | Admitting: Urology

## 2019-02-04 ENCOUNTER — Other Ambulatory Visit: Payer: Self-pay

## 2019-02-04 ENCOUNTER — Encounter: Payer: Self-pay | Admitting: Urology

## 2019-02-04 VITALS — BP 158/67 | HR 52 | Ht 66.0 in | Wt 157.0 lb

## 2019-02-04 DIAGNOSIS — D3012 Benign neoplasm of left renal pelvis: Secondary | ICD-10-CM

## 2019-02-04 DIAGNOSIS — N200 Calculus of kidney: Secondary | ICD-10-CM

## 2019-02-04 DIAGNOSIS — N281 Cyst of kidney, acquired: Secondary | ICD-10-CM

## 2019-02-04 NOTE — Progress Notes (Signed)
02/04/2019 11:39 AM   Donnetta Simpers 11-16-1956 [id number]  Referring provider: Venia Carbon, MD [place], [place] [id number]    HPI: 63 year old male who returns today for routine annual follow-up.  He initially presented in 03/2017 with a left proximal ureteral enhancement which was fairly significant felt to be possibly related to underlying infection versus TCC.  He underwent staged ureteroscopy for treatment of nonobstructing stone as well as renal pelvic biopsies.  Intraoperatively, his urothelium was noted to be markedly edematous without papillary change.  Biopsy was consistent with the urothelial hyperplasia with inflammation but no atypia or malignancy.  The profound nature of his thickening, he returns today with a follow-up CT urogram to ensure complete resolution of this abnormal area.  CT urogram on 01/29/2019 shows near complete resolution of his left urothelial thickening without residual enhancement.  There are no filling defects appreciated.  No adenopathy.  He does have a 6 mm left mid kidney stone which was not felt to be in the collecting system at the time of ureteroscopy.  He has a 2 mm stone.  There are several renal cyst, right greater than left measuring up to 6.5 cm, Bosniak 1 and 2.  He denies any urinary issues, flank pain, urinary frequency, urgency, or gross hematuria.    PMH: Past Medical History:  Diagnosis Date  . Allergy    allergic rhinitis  . Asthma    mostly in childhood  . BPH (benign prostatic hypertrophy)   . Heart murmur   . History of kidney stones   . Hypertension   . Hypertriglyceridemia   . Hypothyroidism   . Thyroid disease    hypothyroidism    Surgical History: Past Surgical History:  Procedure Laterality Date  . CARDIAC CATHETERIZATION  2006   [place]  . CARDIOVASCULAR STRESS TEST  2006   [place]  . CHOLECYSTECTOMY  2006  . CYSTOSCOPY W/ URETERAL STENT PLACEMENT  05/24/2017   Procedure: left;   Surgeon: Nickie Retort, MD;  Location: [facility] OR[facility];  Service: Urology;;  . Consuela Mimes W/ URETERAL STENT PLACEMENT Left 06/11/2017   Procedure: CYSTOSCOPY WITH STENT REPLACEMENT;  Surgeon: Hollice Espy, MD;  Location: [facility];  Service: Urology;  Laterality: Left;  . CYSTOSCOPY WITH BIOPSY Left 06/11/2017   Procedure: RENAL PELVIS TUMOR WITH BIOPSY WITH POSSIBLE LASER ABLATION;  Surgeon: Hollice Espy, MD;  Location: [facility];  Service: Urology;  Laterality: Left;  . CYSTOSCOPY WITH STENT PLACEMENT Left 05/24/2017   Procedure: , cystoscopy,left ureteral stent placement and left ureteroscopy attempted, retrograde pylogram;  Surgeon: Nickie Retort, MD;  Location: [facility];  Service: Urology;  Laterality: Left;  . EYE SURGERY Bilateral    Cataract Extraction with IOL  . URETEROSCOPY WITH HOLMIUM LASER LITHOTRIPSY Left 06/11/2017   Procedure: URETEROSCOPY WITH HOLMIUM LASER LITHOTRIPSY;  Surgeon: Hollice Espy, MD;  Location: [facility];  Service: Urology;  Laterality: Left;  Marland Kitchen VEIN LIGATION AND STRIPPING Bilateral 1997    Home Medications:  Allergies as of 02/04/2019      Reactions   Tetracycline Hcl Other (See Comments)   Joint pain (and stiffened them, also)   Amlodipine Swelling   Lower extremities and body became swollen; can tolerate 5 mg doses, however      Medication List       Accurate as of February 04, 2019 11:39 AM. Always use your most recent med list.        albuterol 108 (90 Base) MCG/ACT inhaler Commonly  known as:  PROVENTIL HFA;VENTOLIN HFA INHALE 2 PUFFS BY MOUTH THREE TIMES DAILY AS NEEDED FOR ASTHMA FLARE   aspirin 81 MG tablet Take 81 mg by mouth daily.   cetirizine 10 MG tablet Commonly known as:  ZYRTEC Take 10 mg by mouth daily.   EQL Omega 3 Fish Oil 1200 MG Caps Take 2,400 mg by mouth daily.   Fluticasone-Salmeterol 100-50 MCG/DOSE Aepb Commonly known as:  ADVAIR Inhale 1 puff into the lungs daily as needed (when symptomatic).    levothyroxine 125 MCG tablet Commonly known as:  SYNTHROID, LEVOTHROID Take 1 tablet (125 mcg total) by mouth daily.   losartan 100 MG tablet Commonly known as:  COZAAR Take 1 tablet (100 mg total) by mouth daily.       Allergies:  Allergies  Allergen Reactions  . Tetracycline Hcl Other (See Comments)    Joint pain (and stiffened them, also)  . Amlodipine Swelling    Lower extremities and body became swollen; can tolerate 5 mg doses, however    Family History: Family History  Problem Relation Age of Onset  . Alcohol abuse Father   . Prostate cancer Father   . Alcohol abuse Mother   . Asthma Mother   . Liver cancer Paternal Grandmother   . Colon cancer Neg Hx   . Coronary artery disease Neg Hx   . Hypertension Neg Hx   . Diabetes Neg Hx   . Bladder Cancer Neg Hx   . Kidney cancer Neg Hx     Social History:  reports that he quit smoking about 40 years ago. His smoking use included cigarettes. He has never used smokeless tobacco. He reports that he does not drink alcohol or use drugs.  ROS: UROLOGY Frequent Urination?: No Hard to postpone urination?: No Burning/pain with urination?: No Get up at night to urinate?: No Leakage of urine?: No Urine stream starts and stops?: No Trouble starting stream?: No Do you have to strain to urinate?: No Blood in urine?: No Urinary tract infection?: No Sexually transmitted disease?: No Injury to kidneys or bladder?: No Painful intercourse?: No Weak stream?: No Erection problems?: No Penile pain?: No  Gastrointestinal Nausea?: No Vomiting?: No Indigestion/heartburn?: No Diarrhea?: No Constipation?: No  Constitutional Fever: No Night sweats?: No Weight loss?: No Fatigue?: No  Skin Skin rash/lesions?: No Itching?: No  Eyes Blurred vision?: No Double vision?: No  Ears/Nose/Throat Sore throat?: No Sinus problems?: No  Hematologic/Lymphatic Swollen glands?: No Easy bruising?: No  Cardiovascular Leg  swelling?: No Chest pain?: No  Respiratory Cough?: No Shortness of breath?: No  Endocrine Excessive thirst?: No  Musculoskeletal Back pain?: No Joint pain?: No  Neurological Headaches?: No Dizziness?: No  Psychologic Depression?: No Anxiety?: No  Physical Exam: BP (!) 158/67 (BP Location: Left Arm, Patient Position: Sitting)   Pulse (!) 52   Ht 5\' 6"  (1.676 m)   Wt 157 lb (71.2 kg)   BMI 25.34 kg/m   Constitutional:  Alert and oriented, No acute distress. HEENT: [place] AT, moist mucus membranes.  Trachea midline, no masses. Cardiovascular: No clubbing, cyanosis, or edema. Respiratory: Normal respiratory effort, no increased work of breathing. Skin: No rashes, bruises or suspicious lesions. Neurologic: Grossly intact, no focal deficits, moving all 4 extremities. Psychiatric: Normal mood and affect.  Laboratory Data: Lab Results  Component Value Date   CREATININE 0.80 01/29/2019    Pertinent Imaging:. Results for orders placed during the hospital encounter of 01/29/19  CT HEMATURIA WORKUP   Narrative CLINICAL DATA:  Previous hematuria, benign prostatic hypertrophy, and history of renal stones. Prior left proximal urothelial enhancement in 2018, biopsy suggesting urothelial hyperplasia and inflammation.  EXAM: CT ABDOMEN AND PELVIS WITHOUT AND WITH CONTRAST  TECHNIQUE: Multidetector CT imaging of the abdomen and pelvis was performed following the standard protocol before and following the bolus administration of intravenous contrast.  CONTRAST:  136mL OMNIPAQUE IOHEXOL 300 MG/ML  SOLN  COMPARISON:  12/31/2017  FINDINGS: Lower chest: Moderate cardiomegaly with suspected right coronary artery atherosclerotic calcification.  Hepatobiliary: 3 mm hypodense lesion in the lateral segment left hepatic lobe on image 13/10, no change from prior. Cholecystectomy. Common bile duct measures up to 9 mm in diameter, likely a physiologic response to cholecystectomy.   Pancreas: Unremarkable  Spleen: Unremarkable  Adrenals/Urinary Tract: Adrenal glands normal.  0.6 cm in long axis left mid kidney calcification, image 76/6, adjacent to but not definitively within a calyx. 2 mm right kidney upper pole nonobstructive renal calculus, image 80/6.  5.6 by 3.4 by 6.5 cm Bosniak category 1 cyst of the right kidney posteriorly on image 24/10. 1.4 by 1.2 cm Bosniak category 2 cyst of the right kidney upper pole. No additional renal calculi are identified.  The portal venous phase images are of mixed contrast, due to an injector malfunction. This is not felt to significantly adversely affect diagnostic sensitivity and specificity in this case. No additional filling defect along the urothelium is identified.  Stomach/Bowel: Slight area of prominence in the mid appendix is ascribed to appendiceal contents rather than enhancing lesion or significant abnormality.  Vascular/Lymphatic: Aortoiliac atherosclerotic vascular disease. No pathologic adenopathy.  Reproductive: Unremarkable  Other: No supplemental non-categorized findings.  Musculoskeletal: Stable sclerotic lesion in the right iliac bone, likely chondroid or bone island.  IMPRESSION: 1. 2 mm right kidney upper pole nonobstructive renal calculus. 2. 6 mm in long axis calcification along the left mid kidney is near the margin of a calyx but not definitively within the calyx, and might be parenchymal. A similar appearance was shown on the prior exam. 3. Benign-appearing right renal cysts. 4. Moderate cardiomegaly. Right coronary atherosclerosis. Aortic Atherosclerosis (ICD10-I70.0).   Electronically Signed   By: Van Clines M.D.   On: 01/29/2019 10:03    CT urogram was personally reviewed today and with the patient.  This was compared to multiple previous scans.  Agree with radiologic interpretation.  There is near complete resolution of his inflammatory process.  No there additional  significant pathology appreciated.  Assessment & Plan:    1. Benign neoplasm of left renal pelvis Complete resolution of previous inflammatory changes, very reassuring  Confirms inflammatory rather than malignant process which is now resolved  2. Nephrolithiasis Parenchymal 6 mm left calcification, not within the collecting system 2 mm right nonobstructing stone, asymptomatic  3. Renal cyst Bilateral right greater than left renal cyst, Bosniak 1 and 2 No further evaluation needed  F/u prn  Hollice Espy, MD  [place] [address] [place], [place] [address] [phone]

## 2019-03-10 ENCOUNTER — Encounter: Payer: Self-pay | Admitting: Internal Medicine

## 2019-03-18 ENCOUNTER — Other Ambulatory Visit: Payer: Self-pay

## 2019-03-29 ENCOUNTER — Telehealth: Payer: Self-pay | Admitting: Internal Medicine

## 2019-03-30 NOTE — Telephone Encounter (Signed)
Find out if he expects insurance at some point We can defer at least 6 months if he doesn't

## 2019-03-30 NOTE — Telephone Encounter (Signed)
Okay 

## 2019-03-30 NOTE — Telephone Encounter (Signed)
I filled the albuterol request, but he was last seen 11/2017 for CPE. He does not have insurance listed. Do you want [place] to reach out for a CPE or at least f/u asthma?

## 2019-03-30 NOTE — Telephone Encounter (Signed)
I spoke to patient and he has [facility]. It's not insurance, but they reimburse him. Patient had a cpx scheduled, but we had to cancel the appointment due to Covid 19 and I told him I'd call him back to reschedule.  Patient has been having shoulder,knee, and groin pain for the past year.  I spoke to [facility] and she said we could do a virtual visit for the pain.  He scheduled a virtual visit for 04/01/19 to discuss the pain with Dr.Letvak and I told him Dr.Letvak would probably let him know when he wanted him to come in for his cpx.

## 2019-03-31 ENCOUNTER — Ambulatory Visit: Payer: Self-pay | Admitting: Cardiovascular Disease

## 2019-04-01 ENCOUNTER — Ambulatory Visit (INDEPENDENT_AMBULATORY_CARE_PROVIDER_SITE_OTHER): Payer: Self-pay | Admitting: Internal Medicine

## 2019-04-01 ENCOUNTER — Encounter: Payer: Self-pay | Admitting: Internal Medicine

## 2019-04-01 ENCOUNTER — Telehealth: Payer: Self-pay | Admitting: Internal Medicine

## 2019-04-01 VITALS — BP 140/56 | Ht 66.0 in | Wt 153.0 lb

## 2019-04-01 DIAGNOSIS — M2550 Pain in unspecified joint: Secondary | ICD-10-CM | POA: Insufficient documentation

## 2019-04-01 DIAGNOSIS — Z125 Encounter for screening for malignant neoplasm of prostate: Secondary | ICD-10-CM

## 2019-04-01 DIAGNOSIS — I10 Essential (primary) hypertension: Secondary | ICD-10-CM

## 2019-04-01 NOTE — Progress Notes (Signed)
Subjective:    Patient ID: Jeffrey Tucker, male    DOB: 12-09-1955, 63 y.o.   MRN: [id number]  HPI Virtual visit due to scattered joint pains Identification done Reviewed billing and he gave consent He is in his home--and I am in my office  About a year ago, he had pain in his right shoulder Especially if holding it in certain positions Especially bad if he had to reach for something--or putting on coat Would get sharp pain and couldn't use arm for 30 seconds or so Started in left about 3 months ago Still there now--but not as bad  Perhaps 6 months ago, started feeling discomfort in groin Limited him jogging--could only do a walk Also some pain in knees---especially bad when doing his daily squats Very stiff when first getting up No apparent joint swelling  Hasn't tried any medication for this  Current Outpatient Medications on File Prior to Visit  Medication Sig Dispense Refill  . albuterol (VENTOLIN HFA) 108 (90 Base) MCG/ACT inhaler INHALE 2 PUFFS BY MOUTH THREE TIMES DAILY AS NEEDED FOR ASTHMA FLARE 8.5 g 1  . aspirin 81 MG tablet Take 81 mg by mouth daily.    . cetirizine (ZYRTEC) 10 MG tablet Take 10 mg by mouth daily.    . Fluticasone-Salmeterol (ADVAIR) 100-50 MCG/DOSE AEPB Inhale 1 puff into the lungs daily as needed (when symptomatic). 60 each 0  . levothyroxine (SYNTHROID, LEVOTHROID) 125 MCG tablet Take 1 tablet (125 mcg total) by mouth daily. 90 tablet 1  . losartan (COZAAR) 100 MG tablet Take 1 tablet (100 mg total) by mouth daily. 90 tablet 3   No current facility-administered medications on file prior to visit.     Allergies  Allergen Reactions  . Tetracycline Hcl Other (See Comments)    Joint pain (and stiffened them, also)  . Amlodipine Swelling    Lower extremities and body became swollen; can tolerate 5 mg doses, however    Past Medical History:  Diagnosis Date  . Allergy    allergic rhinitis  . Asthma    mostly in childhood  . BPH (benign  prostatic hypertrophy)   . Heart murmur   . History of kidney stones   . Hypertension   . Hypertriglyceridemia   . Hypothyroidism   . Thyroid disease    hypothyroidism    Past Surgical History:  Procedure Laterality Date  . CARDIAC CATHETERIZATION  2006   [place]  . CARDIOVASCULAR STRESS TEST  2006   [place]  . CHOLECYSTECTOMY  2006  . CYSTOSCOPY W/ URETERAL STENT PLACEMENT  05/24/2017   Procedure: left;  Surgeon: Nickie Retort, MD;  Location: [facility];  Service: Urology;;  . Consuela Mimes W/ URETERAL STENT PLACEMENT Left 06/11/2017   Procedure: CYSTOSCOPY WITH STENT REPLACEMENT;  Surgeon: Hollice Espy, MD;  Location: [facility] OR[facility];  Service: Urology;  Laterality: Left;  . CYSTOSCOPY WITH BIOPSY Left 06/11/2017   Procedure: RENAL PELVIS TUMOR WITH BIOPSY WITH POSSIBLE LASER ABLATION;  Surgeon: Hollice Espy, MD;  Location: [facility] OR[facility];  Service: Urology;  Laterality: Left;  . CYSTOSCOPY WITH STENT PLACEMENT Left 05/24/2017   Procedure: , cystoscopy,left ureteral stent placement and left ureteroscopy attempted, retrograde pylogram;  Surgeon: Nickie Retort, MD;  Location: [facility] ORS;  Service: Urology;  Laterality: Left;  . EYE SURGERY Bilateral    Cataract Extraction with IOL  . URETEROSCOPY WITH HOLMIUM LASER LITHOTRIPSY Left 06/11/2017   Procedure: URETEROSCOPY WITH HOLMIUM LASER LITHOTRIPSY;  Surgeon: Hollice Espy, MD;  [facility]  ORS;  Service: Urology;  Laterality: Left;  Marland Kitchen VEIN LIGATION AND STRIPPING Bilateral 1997    Family History  Problem Relation Age of Onset  . Alcohol abuse Father   . Prostate cancer Father   . Alcohol abuse Mother   . Asthma Mother   . Liver cancer Paternal Grandmother   . Colon cancer Neg Hx   . Coronary artery disease Neg Hx   . Hypertension Neg Hx   . Diabetes Neg Hx   . Bladder Cancer Neg Hx   . Kidney cancer Neg Hx     Social History   Socioeconomic History  . Marital status: Married    Spouse name: [name]  . Number of  children: 2  . Years of education: Not on file  . Highest education level: Not on file  Occupational History  . Occupation: Sales promotion account executive  . 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 Occupation: Adult nurse estate  Social Needs  . Financial resource strain: Not on file  . Food insecurity:    Worry: Not on file    Inability: Not on file  . Transportation needs:    Medical: Not on file    Non-medical: Not on file  Tobacco Use  . Smoking status: Former Smoker    Types: Cigarettes    Last attempt to quit: 11/26/1978    Years since quitting: 40.3  . Smokeless tobacco: Never Used  . Tobacco comment: social smoked till 68's  Substance and Sexual Activity  . Alcohol use: No    Alcohol/week: 0.0 standard drinks    Comment: none since 1984  . Drug use: No  . Sexual activity: Not on file  Lifestyle  . Physical activity:    Days per week: Not on file    Minutes per session: Not on file  . Stress: Not on file  Relationships  . Social connections:    Talks on phone: Not on file    Gets together: Not on file    Attends religious service: Not on file    Active member of club or organization: Not on file    Attends meetings of clubs or organizations: Not on file    Relationship status: Not on file  . Intimate partner violence:    Fear of current or ex partner: Not on file    Emotionally abused: Not on file    Physically abused: Not on file    Forced sexual activity: Not on file  Other Topics Concern  . Not on file  Social History Narrative  . Not on file   Review of Systems No fever Appetite is fine No weight loss No headache or vision loss    Objective:   Physical Exam  Constitutional: He appears well-developed. No distress.  Musculoskeletal:     Comments: No shoulder or knee swelling Normal abduction of shoulders but somewhat limited internal and external rotation Normal ROM in hips--points to both inguinal areas as pain  spots  Psychiatric: He has a normal mood and affect. His behavior is normal.           Assessment & Plan:

## 2019-04-01 NOTE — Assessment & Plan Note (Signed)
Goes back a year or so Only change is the apple cider vinegar---though I doubt that is related, if no other answer, he will try holding it for 1-2 weeks to see if it makes a difference Presentation not consistent with OA or RA--but could be a viral based arthritis (though no clear synovitis) Most consistent diagnosis would be PMR Will check sed rate and start low dose prednisone if elevated

## 2019-04-01 NOTE — Assessment & Plan Note (Signed)
BP Readings from Last 3 Encounters:  04/01/19 (!) 140/56  02/04/19 (!) 158/67  01/29/18 (!) 145/66   Has had some mildly elevated numbers Will decide about increasing Rx at upcoming PE

## 2019-04-01 NOTE — Telephone Encounter (Signed)
I left a message for patient to call back and schedule a f/u appointment with Dr.Letvak in 2-4 weeks.

## 2019-04-02 ENCOUNTER — Other Ambulatory Visit (INDEPENDENT_AMBULATORY_CARE_PROVIDER_SITE_OTHER): Payer: Self-pay

## 2019-04-02 DIAGNOSIS — I10 Essential (primary) hypertension: Secondary | ICD-10-CM

## 2019-04-02 DIAGNOSIS — Z125 Encounter for screening for malignant neoplasm of prostate: Secondary | ICD-10-CM

## 2019-04-02 DIAGNOSIS — M2550 Pain in unspecified joint: Secondary | ICD-10-CM

## 2019-04-02 LAB — LDL CHOLESTEROL, DIRECT: Direct LDL: 107 mg/dL

## 2019-04-02 LAB — COMPREHENSIVE METABOLIC PANEL
ALT: 10 U/L (ref 0–53)
AST: 13 U/L (ref 0–37)
Albumin: 3.8 g/dL (ref 3.5–5.2)
Alkaline Phosphatase: 89 U/L (ref 39–117)
BUN: 17 mg/dL (ref 6–23)
CO2: 27 mEq/L (ref 19–32)
Calcium: 8.6 mg/dL (ref 8.4–10.5)
Chloride: 104 mEq/L (ref 96–112)
Creatinine, Ser: 0.85 mg/dL (ref 0.40–1.50)
GFR: 90.96 mL/min (ref >60.00)
Glucose, Bld: 80 mg/dL (ref 70–99)
Potassium: 4.4 mEq/L (ref 3.5–5.1)
Sodium: 139 mEq/L (ref 135–145)
Total Bilirubin: 0.5 mg/dL (ref 0.2–1.2)
Total Protein: 6.5 g/dL (ref 6.0–8.3)

## 2019-04-02 LAB — CBC
HCT: 37 % — ABNORMAL LOW (ref 39.0–52.0)
Hemoglobin: 12.8 g/dL — ABNORMAL LOW (ref 13.0–17.0)
MCHC: 34.6 g/dL (ref 30.0–36.0)
MCV: 91.4 fl (ref 78.0–100.0)
Platelets: 239 10*3/uL (ref 150.0–400.0)
RBC: 4.04 Mil/uL — ABNORMAL LOW (ref 4.22–5.81)
RDW: 13.3 % (ref 11.5–15.5)
WBC: 5.7 10*3/uL (ref 4.0–10.5)

## 2019-04-02 LAB — SEDIMENTATION RATE: Sed Rate: 14 mm/hr (ref 0–20)

## 2019-04-02 LAB — LIPID PANEL
Cholesterol: 184 mg/dL (ref 0–200)
HDL: 29.6 mg/dL — ABNORMAL LOW (ref >39.00)
NonHDL: 154.49
Total CHOL/HDL Ratio: 6
Triglycerides: 207 mg/dL — ABNORMAL HIGH (ref 0.0–149.0)
VLDL: 41.4 mg/dL — ABNORMAL HIGH (ref 0.0–40.0)

## 2019-04-02 LAB — PSA: PSA: 0.33 ng/mL (ref 0.10–4.00)

## 2019-04-29 ENCOUNTER — Telehealth: Payer: Self-pay | Admitting: Cardiovascular Disease

## 2019-04-29 DIAGNOSIS — Q231 Congenital insufficiency of aortic valve: Secondary | ICD-10-CM

## 2019-04-29 DIAGNOSIS — I77810 Thoracic aortic ectasia: Secondary | ICD-10-CM

## 2019-04-29 NOTE — Telephone Encounter (Signed)
Patient calling States that he would like to have an ECHO before scheduling an appointment with Dr Rockey Situ They usually discuss the ECHO during appointment Please advise if an order for ECHO can be placed

## 2019-04-29 NOTE — Telephone Encounter (Signed)
Pt calling to request Echo... due for OV 12/2018 but not scheduled yet..   Last OV 12/2017 mentioned repeat Echo early 2020 to follow dilated aortic root and ascending aorta.   Last Echo 2018. Will forward to be sure okay to order prior to making appt with Dr. Candis Musa.. concerned wait list due to [place].

## 2019-04-30 NOTE — Telephone Encounter (Signed)
Ok to order echo Bicuspid aortic valve, dilated aorta

## 2019-05-01 ENCOUNTER — Other Ambulatory Visit: Payer: Self-pay

## 2019-05-01 ENCOUNTER — Encounter: Payer: Self-pay | Admitting: Internal Medicine

## 2019-05-01 ENCOUNTER — Ambulatory Visit: Payer: Self-pay | Admitting: Internal Medicine

## 2019-05-01 VITALS — BP 148/50 | HR 52 | Temp 97.7°F | Ht 66.0 in | Wt 153.5 lb

## 2019-05-01 DIAGNOSIS — I10 Essential (primary) hypertension: Secondary | ICD-10-CM

## 2019-05-01 DIAGNOSIS — J4520 Mild intermittent asthma, uncomplicated: Secondary | ICD-10-CM

## 2019-05-01 DIAGNOSIS — Q231 Congenital insufficiency of aortic valve: Secondary | ICD-10-CM

## 2019-05-01 DIAGNOSIS — E039 Hypothyroidism, unspecified: Secondary | ICD-10-CM

## 2019-05-01 DIAGNOSIS — Z1211 Encounter for screening for malignant neoplasm of colon: Secondary | ICD-10-CM

## 2019-05-01 DIAGNOSIS — Z0000 Encounter for general adult medical examination without abnormal findings: Secondary | ICD-10-CM

## 2019-05-01 LAB — TSH: TSH: 2.12 u[IU]/mL (ref 0.35–4.50)

## 2019-05-01 LAB — T4, FREE: Free T4: 0.91 ng/dL (ref 0.60–1.60)

## 2019-05-01 NOTE — Assessment & Plan Note (Signed)
BP Readings from Last 3 Encounters:  05/01/19 (!) 148/50  04/01/19 (!) 140/56  02/04/19 (!) 158/67   He has variable levels but is low at home I am reluctant to increase meds due to his AS and low diastolic level

## 2019-05-01 NOTE — Assessment & Plan Note (Signed)
Needs to reschedule visit with Dr Rockey Situ I think he is due for echo

## 2019-05-01 NOTE — Progress Notes (Signed)
Subjective:    Patient ID: Jeffrey Tucker, male    DOB: 1956/07/25, 63 y.o.   MRN: [id number]  HPI Here for physical  Still having some aching Some pain in groin and knees Right arm/shoulder particularly gives him trouble--like trying to reach for something  He feels his energy levels are worse---can't run up stairs like he used to, etc No chest pain No problems with exercise---reviewed his regimen on the form he gave me Reviewed--not taking the advair regularly (discussed that this may be the problem)  Checks his BP regularly---average 127/53 Keeps up with cardiologist for his aortic valve  Will get heartburn--feels it comes on with his emotions tums will help No dysphagia  Still working but plans to retire in January Does real estate and taxes Able to do all his work from [employer] still do taxes, etc  Current Outpatient Medications on File Prior to Visit  Medication Sig Dispense Refill  . albuterol (VENTOLIN HFA) 108 (90 Base) MCG/ACT inhaler INHALE 2 PUFFS BY MOUTH THREE TIMES DAILY AS NEEDED FOR ASTHMA FLARE 8.5 g 1  . aspirin 81 MG tablet Take 81 mg by mouth daily.    . cetirizine (ZYRTEC) 10 MG tablet Take 10 mg by mouth daily.    . Fluticasone-Salmeterol (ADVAIR) 100-50 MCG/DOSE AEPB Inhale 1 puff into the lungs daily as needed (when symptomatic). 60 each 0  . levothyroxine (SYNTHROID, LEVOTHROID) 125 MCG tablet Take 1 tablet (125 mcg total) by mouth daily. 90 tablet 1  . losartan (COZAAR) 100 MG tablet Take 1 tablet (100 mg total) by mouth daily. 90 tablet 3   No current facility-administered medications on file prior to visit.     Allergies  Allergen Reactions  . Tetracycline Hcl Other (See Comments)    Joint pain (and stiffened them, also)  . Amlodipine Swelling    Lower extremities and body became swollen; can tolerate 5 mg doses, however    Past Medical History:  Diagnosis Date  . Allergy    allergic rhinitis  . Asthma    mostly in childhood  .  BPH (benign prostatic hypertrophy)   . Heart murmur   . History of kidney stones   . Hypertension   . Hypertriglyceridemia   . Hypothyroidism   . Thyroid disease    hypothyroidism    Past Surgical History:  Procedure Laterality Date  . CARDIAC CATHETERIZATION  2006   [place]  . CARDIOVASCULAR STRESS TEST  2006   [place]  . CHOLECYSTECTOMY  2006  . CYSTOSCOPY W/ URETERAL STENT PLACEMENT  05/24/2017   Procedure: left;  Surgeon: Nickie Retort, MD;  Location: [facility];  Service: Urology;;  . Consuela Mimes W/ URETERAL STENT PLACEMENT Left 06/11/2017   Procedure: CYSTOSCOPY WITH STENT REPLACEMENT;  Surgeon: Hollice Espy, MD;  Location: [facility];  Service: Urology;  Laterality: Left;  . CYSTOSCOPY WITH BIOPSY Left 06/11/2017   Procedure: RENAL PELVIS TUMOR WITH BIOPSY WITH POSSIBLE LASER ABLATION;  Surgeon: Hollice Espy, MD;  Location: [facility];  Service: Urology;  Laterality: Left;  . CYSTOSCOPY WITH STENT PLACEMENT Left 05/24/2017   Procedure: , cystoscopy,left ureteral stent placement and left ureteroscopy attempted, retrograde pylogram;  Surgeon: Nickie Retort, MD;  Location: [facility];  Service: Urology;  Laterality: Left;  . EYE SURGERY Bilateral    Cataract Extraction with IOL  . URETEROSCOPY WITH HOLMIUM LASER LITHOTRIPSY Left 06/11/2017   Procedure: URETEROSCOPY WITH HOLMIUM LASER LITHOTRIPSY;  Surgeon: Hollice Espy, MD;  Location: [facility];  Service: Urology;  Laterality: Left;  Marland Kitchen VEIN LIGATION AND STRIPPING Bilateral 1997    Family History  Problem Relation Age of Onset  . Alcohol abuse Father   . Prostate cancer Father   . Alcohol abuse Mother   . Asthma Mother   . Liver cancer Paternal Grandmother   . Colon cancer Neg Hx   . Coronary artery disease Neg Hx   . Hypertension Neg Hx   . Diabetes Neg Hx   . Bladder Cancer Neg Hx   . Kidney cancer Neg Hx     Social History   Socioeconomic History  . Marital status: Married    Spouse name: Sayan Aldava   . Number of children: 2  . Years of education: Not on file  . Highest education level: Not on file  Occupational History  . Occupation: Sales promotion account executive  . 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 Occupation: Adult nurse estate  Social Needs  . Financial resource strain: Not on file  . Food insecurity:    Worry: Not on file    Inability: Not on file  . Transportation needs:    Medical: Not on file    Non-medical: Not on file  Tobacco Use  . Smoking status: Former Smoker    Types: Cigarettes    Last attempt to quit: 11/26/1978    Years since quitting: 40.4  . Smokeless tobacco: Never Used  . Tobacco comment: social smoked till 1's  Substance and Sexual Activity  . Alcohol use: No    Alcohol/week: 0.0 standard drinks    Comment: none since 1984  . Drug use: No  . Sexual activity: Not on file  Lifestyle  . Physical activity:    Days per week: Not on file    Minutes per session: Not on file  . Stress: Not on file  Relationships  . Social connections:    Talks on phone: Not on file    Gets together: Not on file    Attends religious service: Not on file    Active member of club or organization: Not on file    Attends meetings of clubs or organizations: Not on file    Relationship status: Not on file  . Intimate partner violence:    Fear of current or ex partner: Not on file    Emotionally abused: Not on file    Physically abused: Not on file    Forced sexual activity: Not on file  Other Topics Concern  . Not on file  Social History Narrative  . Not on file    Review of Systems  Constitutional: Negative for fatigue and unexpected weight change.       Wears seat belt  HENT: Positive for hearing loss. Negative for dental problem, tinnitus and trouble swallowing.   Eyes: Negative for visual disturbance.       No diplopia or unilateral vision loos  Respiratory: Positive for shortness of breath and wheezing. Negative for cough.         Not taking the advair regularly--due to cost Uses the albuterol prn  Cardiovascular: Negative for chest pain and leg swelling.       Occ sense of racing heart  Gastrointestinal: Negative for blood in stool and constipation.  Endocrine: Negative for polydipsia and polyuria.  Genitourinary: Negative for urgency.       Mild slowing of stream No ED  Musculoskeletal: Positive for arthralgias and myalgias. Negative for back pain and joint swelling.  Skin: Negative for rash.  Allergic/Immunologic: Positive for environmental allergies. Negative for immunocompromised state.       Zyrtec helps  Neurological: Negative for dizziness, syncope, light-headedness and headaches.  Hematological: Negative for adenopathy. Does not bruise/bleed easily.  Psychiatric/Behavioral: Negative for dysphoric mood and sleep disturbance. The patient is not nervous/anxious.        Objective:   Physical Exam  Constitutional: He is oriented to person, place, and time. He appears well-developed. No distress.  HENT:  Head: Normocephalic and atraumatic.  Right Ear: External ear normal.  Left Ear: External ear normal.  Mouth/Throat: Oropharynx is clear and moist. No oropharyngeal exudate.  Eyes: Pupils are equal, round, and reactive to light. Conjunctivae are normal.  Neck: No thyromegaly present.  Cardiovascular: Normal rate, regular rhythm and intact distal pulses. Exam reveals no gallop.  Gr 2/6 aortic systolic murmur and prominent diastolic murmur  Respiratory: Effort normal and breath sounds normal. No respiratory distress. He has no wheezes. He has no rales.  GI: Soft. There is no abdominal tenderness.  Musculoskeletal:        General: No tenderness or edema.  Lymphadenopathy:    He has no cervical adenopathy.  Neurological: He is alert and oriented to person, place, and time.  Skin: No rash noted. No erythema.  Psychiatric: He has a normal mood and affect. His behavior is normal.            Assessment & Plan:

## 2019-05-01 NOTE — Assessment & Plan Note (Signed)
Will recheck levels

## 2019-05-01 NOTE — Assessment & Plan Note (Signed)
Not taking the advair due to cost Discussed it could affect his peak performance and asked him to try it again regularly

## 2019-05-01 NOTE — Telephone Encounter (Signed)
LMOV to schedule ECHO and follow up

## 2019-05-01 NOTE — Assessment & Plan Note (Signed)
Healthy There is some concern about change in peak exercise tolerance--but stays in shape Will check FIT due to mild anemia PSA was normal Flu vaccine in the fall

## 2019-05-01 NOTE — Telephone Encounter (Signed)
Patient can have echo scheduled and Virtual (preferably) or in office visit schedule with Dr Rockey Situ after.   Thanks!

## 2019-05-11 ENCOUNTER — Other Ambulatory Visit (INDEPENDENT_AMBULATORY_CARE_PROVIDER_SITE_OTHER): Payer: Self-pay

## 2019-05-11 DIAGNOSIS — Z1211 Encounter for screening for malignant neoplasm of colon: Secondary | ICD-10-CM

## 2019-05-11 LAB — FECAL OCCULT BLOOD, IMMUNOCHEMICAL: Fecal Occult Bld: NEGATIVE

## 2019-05-18 ENCOUNTER — Other Ambulatory Visit: Payer: Self-pay | Admitting: Internal Medicine

## 2019-06-01 ENCOUNTER — Other Ambulatory Visit: Payer: Self-pay | Admitting: *Deleted

## 2019-06-01 MED ORDER — FLUTICASONE-SALMETEROL 100-50 MCG/DOSE IN AEPB: 1.0000 | INHALATION_SPRAY | Freq: Every day | RESPIRATORY_TRACT | 11 refills | Status: DC | PRN

## 2019-06-01 NOTE — Telephone Encounter (Signed)
Patient left a voicemail stating that he is trying to get a refill on his Advair and not sure if it has been sent in and where? Patient requested that it be sent to [facility] [place] Patient requested a call back regarding the refill.

## 2019-06-01 NOTE — Telephone Encounter (Signed)
Rx sent to [facility]. Spoke to pt.

## 2019-06-05 NOTE — Telephone Encounter (Signed)
LMOM to schedule follow-up appointment 

## 2019-06-15 NOTE — Telephone Encounter (Signed)
Please call him to set up appt to treat scalp lesion

## 2019-06-17 ENCOUNTER — Encounter: Payer: Self-pay | Admitting: Internal Medicine

## 2019-06-17 ENCOUNTER — Ambulatory Visit (INDEPENDENT_AMBULATORY_CARE_PROVIDER_SITE_OTHER): Payer: Self-pay | Admitting: Internal Medicine

## 2019-06-17 ENCOUNTER — Other Ambulatory Visit: Payer: Self-pay

## 2019-06-17 DIAGNOSIS — L219 Seborrheic dermatitis, unspecified: Secondary | ICD-10-CM | POA: Insufficient documentation

## 2019-06-17 MED ORDER — BETAMETHASONE DIPROPIONATE AUG 0.05 % EX LOTN: TOPICAL_LOTION | Freq: Two times a day (BID) | CUTANEOUS | 2 refills | Status: DC | PRN

## 2019-06-17 MED ORDER — KETOCONAZOLE 2 % EX SHAM: 1.0000 "application " | MEDICATED_SHAMPOO | CUTANEOUS | 5 refills | Status: DC

## 2019-06-17 NOTE — Assessment & Plan Note (Signed)
These multiple lesions do not look actinic at this point Will try ketoconazole shampoo and steroid lotion Consider derm evaluation if they don't clear with this

## 2019-06-17 NOTE — Progress Notes (Signed)
Subjective:    Patient ID: Jeffrey Tucker, male    DOB: [birth year], 63 y.o.   MRN: [id number]  HPI Here for evaluation of lesions on the top of his head  Has had a place on his head for several weeks at least Some itching--then will bleed Has "gooey" discharge See email  No apparent trauma Wife then noticed 2 small other spots this morning  Current Outpatient Medications on File Prior to Visit  Medication Sig Dispense Refill  . albuterol (VENTOLIN HFA) 108 (90 Base) MCG/ACT inhaler INHALE 2 PUFFS BY MOUTH THREE TIMES DAILY AS NEEDED FOR ASTHMA FLARE 8.5 g 1  . aspirin 81 MG tablet Take 81 mg by mouth daily.    . cetirizine (ZYRTEC) 10 MG tablet Take 10 mg by mouth daily.    . Fluticasone-Salmeterol (ADVAIR) 100-50 MCG/DOSE AEPB Inhale 1 puff into the lungs daily as needed (when symptomatic). 60 each 11  . levothyroxine (SYNTHROID) 125 MCG tablet Take 1 tablet by mouth once daily 90 tablet 3  . losartan (COZAAR) 100 MG tablet Take 1 tablet (100 mg total) by mouth daily. 90 tablet 3   No current facility-administered medications on file prior to visit.     Allergies  Allergen Reactions  . Tetracycline Hcl Other (See Comments)    Joint pain (and stiffened them, also)  . Amlodipine Swelling    Lower extremities and body became swollen; can tolerate 5 mg doses, however    Past Medical History:  Diagnosis Date  . Allergy    allergic rhinitis  . Asthma    mostly in childhood  . BPH (benign prostatic hypertrophy)   . Heart murmur   . History of kidney stones   . Hypertension   . Hypertriglyceridemia   . Hypothyroidism   . Thyroid disease    hypothyroidism    Past Surgical History:  Procedure Laterality Date  . CARDIAC CATHETERIZATION  2006   [place]  . CARDIOVASCULAR STRESS TEST  2006   [place]  . CHOLECYSTECTOMY  2006  . CYSTOSCOPY W/ URETERAL STENT PLACEMENT  05/24/2017   Procedure: left;  Surgeon: Nickie Retort, MD;  Location: [facility];  Service: Urology;;   . Consuela Mimes W/ URETERAL STENT PLACEMENT Left 06/11/2017   Procedure: CYSTOSCOPY WITH STENT REPLACEMENT;  Surgeon: Hollice Espy, MD;  Location: [facility];  Service: Urology;  Laterality: Left;  . CYSTOSCOPY WITH BIOPSY Left 06/11/2017   Procedure: RENAL PELVIS TUMOR WITH BIOPSY WITH POSSIBLE LASER ABLATION;  Surgeon: Hollice Espy, MD;  Location: [facility];  Service: Urology;  Laterality: Left;  . CYSTOSCOPY WITH STENT PLACEMENT Left 05/24/2017   Procedure: , cystoscopy,left ureteral stent placement and left ureteroscopy attempted, retrograde pylogram;  Surgeon: Nickie Retort, MD;  Location: [facility];  Service: Urology;  Laterality: Left;  . EYE SURGERY Bilateral    Cataract Extraction with IOL  . URETEROSCOPY WITH HOLMIUM LASER LITHOTRIPSY Left 06/11/2017   Procedure: URETEROSCOPY WITH HOLMIUM LASER LITHOTRIPSY;  Surgeon: Hollice Espy, MD;  Location: [facility];  Service: Urology;  Laterality: Left;  Marland Kitchen VEIN LIGATION AND STRIPPING Bilateral 1997    Family History  Problem Relation Age of Onset  . Alcohol abuse Father   . Prostate cancer Father   . Alcohol abuse Mother   . Asthma Mother   . Liver cancer Paternal Grandmother   . Colon cancer Neg Hx   . Coronary artery disease Neg Hx   . Hypertension Neg Hx   . Diabetes Neg Hx   .  Bladder Cancer Neg Hx   . Kidney cancer Neg Hx     Social History   Socioeconomic History  . Marital status: Married    Spouse name: Muneer Leider  . Number of children: 2  . Years of education: Not on file  . Highest education level: Not on file  Occupational History  . Occupation: Sales promotion account executive  . 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 Occupation: Adult nurse estate  Social Needs  . Financial resource strain: Not on file  . Food insecurity    Worry: Not on file    Inability: Not on file  . Transportation needs    Medical: Not on file    Non-medical: Not on file  Tobacco Use  . Smoking  status: Former Smoker    Types: Cigarettes    Quit date: 11/26/1978    Years since quitting: 40.5  . Smokeless tobacco: Never Used  . Tobacco comment: social smoked till 45's  Substance and Sexual Activity  . Alcohol use: No    Alcohol/week: 0.0 standard drinks    Comment: none since 1984  . Drug use: No  . Sexual activity: Not on file  Lifestyle  . Physical activity    Days per week: Not on file    Minutes per session: Not on file  . Stress: Not on file  Relationships  . Social Herbalist on phone: Not on file    Gets together: Not on file    Attends religious service: Not on file    Active member of club or organization: Not on file    Attends meetings of clubs or organizations: Not on file    Relationship status: Not on file  . Intimate partner violence    Fear of current or ex partner: Not on file    Emotionally abused: Not on file    Physically abused: Not on file    Forced sexual activity: Not on file  Other Topics Concern  . Not on file  Social History Narrative  . Not on file   Review of Systems Slight dandruff--not a big deal No facial rash or around his ears    Objective:   Physical Exam  Constitutional: He appears well-developed. No distress.  Skin:  Main lesion is about 42mm in diameter--scaly without sig inflammation Several other smaller areas that clearly look seborrheic           Assessment & Plan:

## 2019-06-24 ENCOUNTER — Telehealth: Payer: Self-pay

## 2019-06-24 NOTE — Telephone Encounter (Signed)

## 2019-06-25 ENCOUNTER — Ambulatory Visit (INDEPENDENT_AMBULATORY_CARE_PROVIDER_SITE_OTHER): Payer: Self-pay

## 2019-06-25 ENCOUNTER — Other Ambulatory Visit: Payer: Self-pay

## 2019-06-25 DIAGNOSIS — Q231 Congenital insufficiency of aortic valve: Secondary | ICD-10-CM

## 2019-06-25 DIAGNOSIS — I77810 Thoracic aortic ectasia: Secondary | ICD-10-CM

## 2019-06-26 ENCOUNTER — Telehealth: Payer: Self-pay

## 2019-06-26 NOTE — Telephone Encounter (Signed)
Called to give pt echo results and Dr.Gollan's recommendation. Pt sts that he has reviewed the results in mychart. He will wait for his 06/30/19 to discuss in person with Christell Faith, PA. Pt sts that he does not want to see Thurmond Butts and rqst an appt with Dr.Gollan only. Appt rescheduled with Dr.Gollan for 07/20/19 @ 10:40am. Pt sts that he will await the appt to discuss the results with Dr.Gollan. Pt appreciative for the call and the assistance.

## 2019-06-26 NOTE — Telephone Encounter (Signed)
-----   Message from Minna Merritts, MD sent at 06/25/2019 10:32 PM EDT ----- Echo Several changes since 11/2016 Aorta is more dilated 4.1 then now up to 4.9 in the aortic root EF also less >55 before now is 50% We should probably get a chest CTA to accurately measure entire aorta Aorta 5 or higher, we need to meet with specialty team in [place]

## 2019-06-30 ENCOUNTER — Ambulatory Visit: Payer: Self-pay | Admitting: Physician Assistant

## 2019-07-18 NOTE — Progress Notes (Signed)
Cardiology Office Note  Date:  07/20/2019   ID:  Jeffrey Tucker, DOB 1956-06-06, MRN [id number]  PCP:  Venia Carbon, MD   Chief Complaint  Patient presents with  . Other    follow up post ECHO. Meds reviewed verbally with patient.     HPI:  Jeffrey Tucker is a very pleasant 63 year old gentleman with  moderate aortic valve regurgitation, mild to moderately dilated aortic root 4.1 cm  ascending aorta 4.2 cm, Echo 2020: There is dilatation of the aortic root 4.9 cm and of the ascending aorta 4.4 cm, aortic arch 3.7 cm.  possible bicuspid aortic valve.  He presents for routine followup of his aortic valve regurgitation  Recent echo, Several changes since 11/2016  Aorta is more dilated 4.1 then,  now up to 4.9 in the aortic root  EF also less >55 before now is 50%  Moderately dilated LV with moderate to severe aortic valve regurgitation  no shortness of breath,  no fluid retention abdominal bloating or ankle swelling  good exercise tolerance, walks 1 1/2 mils 5 days a week, push ups 55 today  Blood pressure typically running in the 130 range before his morning medications  EKG personally reviewed by myself on todays visit Shows normal sinus rhythm rate 49 bpm LVH    PMH:   has a past medical history of Allergy, Asthma, BPH (benign prostatic hypertrophy), Heart murmur, History of kidney stones, Hypertension, Hypertriglyceridemia, Hypothyroidism, and Thyroid disease.  PSH:    Past Surgical History:  Procedure Laterality Date  . CARDIAC CATHETERIZATION  2006   [place]  . CARDIOVASCULAR STRESS TEST  2006   [place]  . CHOLECYSTECTOMY  2006  . CYSTOSCOPY W/ URETERAL STENT PLACEMENT  05/24/2017   Procedure: left;  Surgeon: Nickie Retort, MD;  Location: [facility];  Service: Urology;;  . Consuela Mimes W/ URETERAL STENT PLACEMENT Left 06/11/2017   Procedure: CYSTOSCOPY WITH STENT REPLACEMENT;  Surgeon: Hollice Espy, MD;  Location: [facility];  Service: Urology;  Laterality: Left;   . CYSTOSCOPY WITH BIOPSY Left 06/11/2017   Procedure: RENAL PELVIS TUMOR WITH BIOPSY WITH POSSIBLE LASER ABLATION;  Surgeon: Hollice Espy, MD;  Location: [facility];  Service: Urology;  Laterality: Left;  . CYSTOSCOPY WITH STENT PLACEMENT Left 05/24/2017   Procedure: , cystoscopy,left ureteral stent placement and left ureteroscopy attempted, retrograde pylogram;  Surgeon: Nickie Retort, MD;  Location: [facility];  Service: Urology;  Laterality: Left;  . EYE SURGERY Bilateral    Cataract Extraction with IOL  . URETEROSCOPY WITH HOLMIUM LASER LITHOTRIPSY Left 06/11/2017   Procedure: URETEROSCOPY WITH HOLMIUM LASER LITHOTRIPSY;  Surgeon: Hollice Espy, MD;  Location: [facility];  Service: Urology;  Laterality: Left;  Marland Kitchen VEIN LIGATION AND STRIPPING Bilateral 1997    Current Outpatient Medications  Medication Sig Dispense Refill  . albuterol (VENTOLIN HFA) 108 (90 Base) MCG/ACT inhaler INHALE 2 PUFFS BY MOUTH THREE TIMES DAILY AS NEEDED FOR ASTHMA FLARE 8.5 g 1  . aspirin 81 MG tablet Take 81 mg by mouth daily.    . betamethasone, augmented, (DIPROLENE) 0.05 % lotion Apply topically 2 (two) times daily as needed. 60 mL 2  . cetirizine (ZYRTEC) 10 MG tablet Take 10 mg by mouth daily.    . Fluticasone-Salmeterol (ADVAIR) 100-50 MCG/DOSE AEPB Inhale 1 puff into the lungs daily as needed (when symptomatic). 60 each 11  . ketoconazole (NIZORAL) 2 % shampoo Apply 1 application topically 2 (two) times a week. 120 mL 5  . levothyroxine (SYNTHROID) 125  MCG tablet Take 1 tablet by mouth once daily 90 tablet 3  . losartan (COZAAR) 100 MG tablet Take 1 tablet (100 mg total) by mouth daily. 90 tablet 3   No current facility-administered medications for this visit.      Allergies:   Tetracycline hcl and Amlodipine   Social History:  The patient  reports that he quit smoking about 40 years ago. His smoking use included cigarettes. He has never used smokeless tobacco. He reports that he does not drink  alcohol or use drugs.   Family History:   family history includes Alcohol abuse in his father and mother; Asthma in his mother; Liver cancer in his paternal grandmother; Prostate cancer in his father.    Review of Systems: Review of Systems  Constitutional: Negative.   HENT: Negative.   Respiratory: Negative.   Cardiovascular: Negative.   Gastrointestinal: Negative.   Musculoskeletal: Negative.   Neurological: Negative.   Psychiatric/Behavioral: Negative.   All other systems reviewed and are negative.   PHYSICAL EXAM: VS:  BP (!) 164/40 (BP Location: Left Arm, Patient Position: Sitting, Cuff Size: Normal)   Pulse (!) 49   Ht 5\' 6"  (1.676 m)   Wt 154 lb 8 oz (70.1 kg)   BMI 24.94 kg/m  , BMI Body mass index is 24.94 kg/m. Constitutional:  oriented to person, place, and time. No distress.  HENT:  Head: Grossly normal Eyes:  no discharge. No scleral icterus.  Neck: No JVD, no carotid bruits  Cardiovascular: Regular rate and rhythm, 3/6 diastolic murmur Pulmonary/Chest: Clear to auscultation bilaterally, no wheezes or rails Abdominal: Soft.  no distension.  no tenderness.  Musculoskeletal: Normal range of motion Neurological:  normal muscle tone. Coordination normal. No atrophy Skin: Skin warm and dry Psychiatric: normal affect, pleasant   Recent Labs: 04/02/2019: ALT 10; BUN 17; Creatinine, Ser 0.85; Hemoglobin 12.8; Platelets 239.0; Potassium 4.4; Sodium 139 05/01/2019: TSH 2.12    Lipid Panel Lab Results  Component Value Date   CHOL 184 04/02/2019   HDL 29.60 (L) 04/02/2019   LDLCALC 103 (H) 05/26/2012   TRIG 207.0 (H) 04/02/2019      Wt Readings from Last 3 Encounters:  07/20/19 154 lb 8 oz (70.1 kg)  06/17/19 154 lb (69.9 kg)  05/01/19 153 lb 8 oz (69.6 kg)     ASSESSMENT AND PLAN:  Bradycardia - Plan: EKG 12-Lead Asymptomatic bradycardia, no further workup  Bicuspid aortic valve - Plan: EKG 12-Lead Seen on echo, Moderate to severe regurg, will refer  to [place]  Aortic valve insufficiency, etiology of cardiac valve disease unspecified -  Moderate to severe aortic valve regurgitation, which is worrisome EF dropping 50%.  Also a concern Dilated LV, moderate.  Discussed with him in detail will refer to [facility] for consideration of AVR CTA to look at aorta given jump in measurements of his aortic root No preprocedure work-up has been done such as heart catheterization.  This was discussed with him that this may be indicated  Ascending aorta dilatation ([place]) - Plan: EKG 12- jump in size, CTA ordered to confirm  Essential hypertension No changes, Will check BP after meds  Long discussion concerning his recent echocardiogram, various treatment options for his aortic valve regurgitation, dilated aorta  Total encounter time more than 45 minutes  Greater than 50% was spent in counseling and coordination of care with the patient   Disposition:   F/U  6 months   Orders Placed This Encounter  Procedures  . CT ANGIO  CHEST AORTA W &/OR WO CONTRAST  . Ambulatory referral to [facility] (only at [place])  . EKG 12-Lead     Signed, Esmond Plants, M.D., Ph.D. 07/20/2019  [place], [place]

## 2019-07-20 ENCOUNTER — Ambulatory Visit (INDEPENDENT_AMBULATORY_CARE_PROVIDER_SITE_OTHER): Payer: Self-pay | Admitting: Cardiovascular Disease

## 2019-07-20 ENCOUNTER — Encounter: Payer: Self-pay | Admitting: Cardiovascular Disease

## 2019-07-20 ENCOUNTER — Other Ambulatory Visit: Payer: Self-pay

## 2019-07-20 VITALS — BP 164/40 | HR 49 | Ht 66.0 in | Wt 154.5 lb

## 2019-07-20 DIAGNOSIS — I77810 Thoracic aortic ectasia: Secondary | ICD-10-CM

## 2019-07-20 DIAGNOSIS — I712 Thoracic aortic aneurysm, without rupture, unspecified: Secondary | ICD-10-CM

## 2019-07-20 DIAGNOSIS — E781 Pure hyperglyceridemia: Secondary | ICD-10-CM

## 2019-07-20 DIAGNOSIS — Q231 Congenital insufficiency of aortic valve: Secondary | ICD-10-CM

## 2019-07-20 DIAGNOSIS — I351 Nonrheumatic aortic (valve) insufficiency: Secondary | ICD-10-CM

## 2019-07-20 DIAGNOSIS — I10 Essential (primary) hypertension: Secondary | ICD-10-CM

## 2019-07-20 NOTE — Patient Instructions (Addendum)
CTA chest for dilated aorta (with contrast)  Referral to Dr. Burt Knack for aortic valve regurgitation (after CTA)   Medication Instructions:  No changes  If you need a refill on your cardiac medications before your next appointment, please call your pharmacy.    Lab work: No new labs needed   If you have labs (blood work) drawn today and your tests are completely normal, you will receive your results only by: Marland Kitchen MyChart Message (if you have MyChart) OR . A paper copy in the mail If you have any lab test that is abnormal or we need to change your treatment, we will call you to review the results.   Testing/Procedures: Non-Cardiac CT Angiography (CTA), is a special type of CT scan that uses a computer to produce multi-dimensional views of major blood vessels throughout the body. In CT angiography, a contrast material is injected through an IV to help visualize the blood vessels  We will submit through insurance and then you will call [phone] to schedule.   Once this test has been done then someone will be in touch to schedule appointment in [place].    Follow-Up: At [facility], you and your health needs are our priority.  As part of our continuing mission to provide you with exceptional heart care, we have created designated Provider Care Teams.  These Care Teams include your primary Cardiologist (physician) and Advanced Practice Providers (APPs -  Physician Assistants and Nurse Practitioners) who all work together to provide you with the care you need, when you need it.  . You will need a follow up appointment in 6 months .   Please call our office 2 months in advance to schedule this appointment.    . Providers on your designated Care Team:   . Murray Hodgkins, NP . Christell Faith, PA-C . Marrianne Mood, PA-C  Any Other Special Instructions Will Be Listed Below (If Applicable).  For educational health videos Log in to : [email or web address] Or : [email or web address], password  : [email or web address]   CT Angiogram  A CT angiogram is a procedure to look at the blood vessels in various areas of the body. For this procedure, a large X-ray machine, called a CT scanner, takes detailed pictures of blood vessels that have been injected with a dye (contrast material). A CT angiogram allows your health care provider to see how well blood is flowing to the area of your body that is being checked. Your health care provider will be able to see if there are any problems, such as a blockage. Tell a health care provider about:  Any allergies you have.  All medicines you are taking, including vitamins, herbs, eye drops, creams, and over-the-counter medicines.  Any problems you or family members have had with anesthetic medicines.  Any blood disorders you have.  Any surgeries you have had.  Any medical conditions you have.  Whether you are pregnant or may be pregnant.  Whether you are breastfeeding.  Any anxiety disorders, chronic pain, or other conditions you have that may increase your stress or prevent you from lying still. What are the risks? Generally, this is a safe procedure. However, problems may occur, including:  Infection.  Bleeding.  Allergic reactions to medicines or dyes.  Damage to other structures or organs.  Kidney damage from the dye or contrast that is used.  Increased risk of cancer from radiation exposure. This risk is low. Talk with your health care provider about: ? The risks and benefits of testing. ?  How you can receive the lowest dose of radiation. What happens before the procedure?  Wear comfortable clothing and remove any jewelry.  Follow instructions from your health care provider about eating and drinking. For most people, instructions may include these actions: ? For 12 hours before the test, avoid caffeine. This includes tea, coffee, soda, and energy drinks or pills. ? For 3-4 hours before the test, stop eating or drinking anything but  water. ? Stay well hydrated by continuing to drink water before the exam. This will help to clear the contrast dye from your body after the test.  Ask your health care provider about changing or stopping your regular medicines. This is especially important if you are taking diabetes medicines or blood thinners. What happens during the procedure?  An IV tube will be inserted into one of your veins.  You will be asked to lie on an exam table. This table will slide in and out of the CT machine during the procedure.  Contrast dye will be injected into the IV tube. You might feel warm, or you may get a metallic taste in your mouth.  The table that you are lying on will move into the CT machine tunnel for the scan.  The person running the machine will give you instructions while the scans are being done. You may be asked to: ? Keep your arms above your head. ? Hold your breath. ? Stay very still, even if the table is moving.  When the scanning is complete, you will be moved out of the machine.  The IV tube will be removed. The procedure may vary among health care providers and hospitals. What happens after the procedure?  You might feel warm, or you may get a metallic taste in your mouth.  You may be asked to drink water or other fluids to wash (flush) the contrast material out of your body.  It is up to you to get the results of your procedure. Ask your health care provider, or the department that is doing the procedure, when your results will be ready. Summary  A CT angiogram is a procedure to look at the blood vessels in various areas of the body.  You will need to stay very still during the exam.  You may be asked to drink water or other fluids to wash (flush) the contrast material out of your body after your scan. This information is not intended to replace advice given to you by your health care provider. Make sure you discuss any questions you have with your health care  provider. Document Released: 07/12/2016 Document Revised: 01/22/2019 Document Reviewed: 07/12/2016 Elsevier Patient Education  [employer].  Aortic Valve Regurgitation  Aortic valve regurgitation is a condition that happens when the aortic valve does not close all the way. The aortic valve is a gate-like structure between the lower left chamber of the heart (left ventricle) and the main blood vessel that supplies blood to the rest of the body (aorta). The aortic valve opens when the left ventricle squeezes to pump blood into the aorta, and it closes when the left ventricle relaxes. In aortic valve regurgitation, which may also be called aortic insufficiency, blood in the aorta leaks through the aortic valve after it has closed. This causes the heart to work harder than usual. If aortic valve regurgitation is not treated, it causes enlargement and weakening of the left ventricle. This can result in heart failure, abnormal heart rhythms (arrhythmias), and other dangerous conditions.  If this condition develops suddenly, it may need to be treated with emergency surgery. What are the causes? This condition may be caused by anything that weakens the aortic valve, such as:  Severe high blood pressure (hypertension).  Infection of the inner lining of the heart or the heart valves (endocarditis).  A ballooning of a weak spot in the aorta wall (aortic aneurysm).  A tear or separation of the inner walls of the aorta (aortic dissection).  Injury (trauma) that damages the aortic valve.  Certain medicines.  Disease of a protein in the body called collagen (collagen vascular disease).  A heart problem (bicuspid aortic valve) that is present at birth (congenital).  An inflammatory condition that can develop after an untreated strep throat infection (rheumatic fever).  Complications during or after a heart surgery. This is rare. What are the signs or symptoms? Symptoms of this condition  include:  Fatigue.  Shortness of breath.  Difficulty breathing while lying flat (orthopnea). You may need to sleep on two or more pillows to breathe better.  Chest discomfort (angina).  Head bobbing.  A fluttering feeling in the chest (palpitations).  An irregular or faster-than-normal heartbeat. Symptoms usually develop gradually, unless this condition was caused by a major injury or by endocarditis. How is this diagnosed? This condition is diagnosed based on:  A physical exam.  An imaging test that uses sound waves to produce images of the heart (echocardiogram). You may also have other tests to confirm the diagnosis, including:  Chest X-ray.  MRI.  A test that records the electrical impulses of the heart (electrocardiogram, ECG).  CT angiogram (CTA). In this procedure, a large X-ray machine, called a CT scanner, takes detailed pictures of blood vessels after dye has been injected into the vessels.  Aortic angiogram. In this procedure, X-ray images are taken after dye has been injected into blood vessels. This tests the function of the aorta. How is this treated? Treatment depends on your symptoms, how severe the condition is, and what problems the condition is causing. Treatment may include:  Observation. If your condition is mild, you may not need treatment. However, you will need to have your condition checked regularly to make sure it is not getting worse or causing serious problems.  Medicines that help the heart work more efficiently.  Surgery to repair or replace the valve, in severe cases. Surgery is usually recommended if the left ventricle enlarges beyond a certain point. If aortic valve regurgitation occurs suddenly, surgery may be needed immediately. Follow these instructions at home:  Take over-the-counter and prescription medicines only as told by your health care provider.  Do not use any products that contain nicotine or tobacco, such as cigarettes,  e-cigarettes, and chewing tobacco. If you need help quitting, ask your health care provider.  If directed by your health care provider, avoid heavy weight lifting and contact sports such as football.  Follow instructions from your health care provider about eating or drinking restrictions. Your health care provider may recommend that you: ? Limit alcohol use to:  0-1 drink a day for women.  0-2 drinks a day for men. ? Be aware of how much alcohol is in your drink. In the [place] one drink equals one 12 oz bottle of beer (355 mL), one 5 oz glass of wine (148 mL), or one 1 oz glass of hard liquor (44 mL). ? Eat foods that are high in fiber, such as fresh fruits and vegetables, whole grains, and beans. ? Eat  less salt (sodium) and salty foods. Check ingredients and nutrition facts on packaged foods and beverages.  Keep all follow-up visits as told by your health care provider. This is important. You may need regular tests to monitor your condition and check how well your heart is pumping blood. Contact a health care provider if:  Your angina symptoms are more frequent or seem to be getting worse.  Your breathing problems seem to be getting worse.  You feel dizzy or close to fainting.  You have swelling in your feet, ankles, legs, or abdomen.  You urinate more than usual during the night (nocturia).  You have an unexplained fever that lasts 2 days or longer.  You develop new symptoms. Get help right away if you:  Have severe chest pain.  Have severe shortness of breath.  Feel rapid or irregular heartbeats.  Feel light-headed or you faint.  Have sudden, unexplained weight gain. Summary  Aortic valve regurgitation is a condition in which the aortic valve does not close all the way. This causes the heart to work harder than usual.  This condition may be treated with observation, medicines, or surgery.  Take over-the-counter and prescription medicines only as told by your health  care provider.  Eat less salt (sodium) and salty foods. Check ingredients and nutrition facts on packaged foods and beverages.  Get help right away if you have severe chest pain, shortness of breath, irregular heartbeats, sudden weight gain, or if you feel light-headed or you faint. This information is not intended to replace advice given to you by your health care provider. Make sure you discuss any questions you have with your health care provider. Document Released: 05/19/2003 Document Revised: 12/17/2018 Document Reviewed: 08/05/2018 Elsevier Patient Education  2020 [employer].

## 2019-07-21 ENCOUNTER — Telehealth: Payer: Self-pay | Admitting: Cardiovascular Disease

## 2019-07-21 NOTE — Telephone Encounter (Signed)
Spoke with patient and reviewed that we canceled referral at this time but that Dr. Rockey Situ would wait for CTA to be done and that once he reviews that we will determine next steps. He verbalized understanding of our conversation, agreement with plan, and had no further questions at this time.

## 2019-07-21 NOTE — Addendum Note (Signed)
Addended by: Valora Corporal on: 07/21/2019 03:26 PM   Modules accepted: Orders

## 2019-07-24 ENCOUNTER — Ambulatory Visit
Admission: RE | Admit: 2019-07-24 | Discharge: 2019-07-24 | Disposition: A | Discharge status: Home / Self Care | Payer: Self-pay | Source: Ambulatory Visit | Attending: Cardiovascular Disease | Admitting: Cardiovascular Disease

## 2019-07-24 ENCOUNTER — Other Ambulatory Visit: Payer: Self-pay

## 2019-07-24 DIAGNOSIS — I712 Thoracic aortic aneurysm, without rupture, unspecified: Secondary | ICD-10-CM

## 2019-07-24 LAB — POCT I-STAT CREATININE: Creatinine, Ser: 0.9 mg/dL (ref 0.61–1.24)

## 2019-07-24 MED ORDER — IOHEXOL 350 MG/ML SOLN
75.0000 mL | Freq: Once | INTRAVENOUS | Status: AC | PRN
  Administered 2019-07-24: 75 mL via INTRAVENOUS

## 2019-07-28 ENCOUNTER — Other Ambulatory Visit: Payer: Self-pay | Admitting: *Deleted

## 2019-07-28 DIAGNOSIS — I359 Nonrheumatic aortic valve disorder, unspecified: Secondary | ICD-10-CM

## 2019-07-28 DIAGNOSIS — I77810 Thoracic aortic ectasia: Secondary | ICD-10-CM

## 2019-07-30 ENCOUNTER — Encounter: Payer: Self-pay | Admitting: Cardiothoracic Surgery

## 2019-08-07 ENCOUNTER — Telehealth: Payer: Self-pay | Admitting: Internal Medicine

## 2019-08-07 MED ORDER — FLUTICASONE-SALMETEROL 100-50 MCG/DOSE IN AEPB: 1.0000 | INHALATION_SPRAY | Freq: Two times a day (BID) | RESPIRATORY_TRACT | 11 refills | Status: DC

## 2019-08-07 NOTE — Telephone Encounter (Signed)
Patient stated that he is need a refilll on his ADVAIR. He stated that the pharmacy is telling him that it is too soon to refill this.    Patient stated that Dr Silvio Pate wanted him to take this 2x a day.  And the prescription the pharmacy has only says 1x daily  The pharmacy is requesting a new prescription with the new instructions sent to them before they could fill.    [place]

## 2019-08-07 NOTE — Telephone Encounter (Signed)
Spoke to pt. Advised him that I sent in a new rx stating 1 puff twice a day.

## 2019-08-14 ENCOUNTER — Other Ambulatory Visit: Payer: Self-pay

## 2019-08-14 ENCOUNTER — Other Ambulatory Visit: Payer: Self-pay | Admitting: *Deleted

## 2019-08-14 ENCOUNTER — Institutional Professional Consult (permissible substitution) (INDEPENDENT_AMBULATORY_CARE_PROVIDER_SITE_OTHER): Payer: Self-pay | Admitting: Cardiothoracic Surgery

## 2019-08-14 ENCOUNTER — Other Ambulatory Visit: Payer: Self-pay | Admitting: Cardiothoracic Surgery

## 2019-08-14 VITALS — BP 170/66 | HR 50 | Temp 97.8°F | Resp 20 | Ht 66.0 in | Wt 149.0 lb

## 2019-08-14 DIAGNOSIS — I712 Thoracic aortic aneurysm, without rupture, unspecified: Secondary | ICD-10-CM

## 2019-08-14 NOTE — Progress Notes (Signed)
[address]       [place] [address]             [phone]     CARDIOTHORACIC SURGERY CONSULTATION REPORT  Referring Provider is Rockey Situ, Kathlene November, MD Primary Cardiologist is No primary care provider on file. PCP is Venia Carbon, MD  Chief Complaint  Patient presents with  . Thoracic Aortic Aneurysm    Surgical eval, Chest CTA 07/24/19, ECHO 06/25/19    HPI:  63 year old gentleman was referred for ascending aortic aneurysm and bicuspid aortic valve with severe valve regurgitation.  In his usual state of health until approximately 10 years ago when during a routine physical when a physician noticed a heart murmur.  Followed somewhat regularly since then for aortic valve regurgitation.  Addition over this time, he has developed ascending aortic aneurysm.  Measuring over 5 cm at the root level.  He has no known family history of aneurysmal disease.  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  His chest pain shortness of breath PND or orthopnea.  Past Medical History:  Diagnosis Date  . Allergy    allergic rhinitis  . Asthma    mostly in childhood  . BPH (benign prostatic hypertrophy)   . Heart murmur   . History of kidney stones   . Hypertension   . Hypertriglyceridemia   . Hypothyroidism   . Thyroid disease    hypothyroidism    Past Surgical History:  Procedure Laterality Date  . CARDIAC CATHETERIZATION  2006   [place]  . CARDIOVASCULAR STRESS TEST  2006   [place]  . CHOLECYSTECTOMY  2006  . CYSTOSCOPY W/ URETERAL STENT PLACEMENT  05/24/2017   Procedure: left;  Surgeon: Nickie Retort, MD;  Location: [facility] OR[facility];  Service: Urology;;  . Consuela Mimes W/ URETERAL STENT PLACEMENT Left 06/11/2017   Procedure: CYSTOSCOPY WITH STENT REPLACEMENT;  Surgeon: Hollice Espy, MD;  Location: [facility] ORS;  Service: Urology;  Laterality: Left;  . CYSTOSCOPY WITH BIOPSY Left 06/11/2017   Procedure: RENAL PELVIS TUMOR  WITH BIOPSY WITH POSSIBLE LASER ABLATION;  Surgeon: Hollice Espy, MD;  Location: [facility] ORS;  Service: Urology;  Laterality: Left;  . CYSTOSCOPY WITH STENT PLACEMENT Left 05/24/2017   Procedure: , cystoscopy,left ureteral stent placement and left ureteroscopy attempted, retrograde pylogram;  Surgeon: Nickie Retort, MD;  Location: [facility] ORS;  Service: Urology;  Laterality: Left;  . EYE SURGERY Bilateral    Cataract Extraction with IOL  . URETEROSCOPY WITH HOLMIUM LASER LITHOTRIPSY Left 06/11/2017   Procedure: URETEROSCOPY WITH HOLMIUM LASER LITHOTRIPSY;  Surgeon: Hollice Espy, MD;  Location: [facility] ORS;  Service: Urology;  Laterality: Left;  Marland Kitchen VEIN LIGATION AND STRIPPING Bilateral 1997    Family History  Problem Relation Age of Onset  . Alcohol abuse Father   . Prostate cancer Father   . Alcohol abuse Mother   . Asthma Mother   . Liver cancer Paternal Grandmother   . Colon cancer Neg Hx   . Coronary artery disease Neg Hx   . Hypertension Neg Hx   . Diabetes Neg Hx   . Bladder Cancer Neg Hx   . Kidney cancer Neg Hx     Social History   Socioeconomic History  . Marital status: Married    Spouse name: Christen Tetterton  . Number of children: 2  . Years of education: Not on file  . Highest education level: Not on file  Occupational History  . Occupation: Sales promotion account executive  . 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 Occupation: Adult nurse estate  Social Needs  . Financial resource strain: Not on file  . Food insecurity    Worry: Not on file    Inability: Not on file  . Transportation needs    Medical: Not on file    Non-medical: Not on file  Tobacco Use  . Smoking status: Former Smoker    Types: Cigarettes    Quit date: 11/26/1978    Years since quitting: 40.7  . Smokeless tobacco: Never Used  . Tobacco comment: social smoked till 59's  Substance and Sexual Activity  . Alcohol use: No    Alcohol/week: 0.0 standard drinks     Comment: none since 1984  . Drug use: No  . Sexual activity: Not on file  Lifestyle  . Physical activity    Days per week: Not on file    Minutes per session: Not on file  . Stress: Not on file  Relationships  . Social Herbalist on phone: Not on file    Gets together: Not on file    Attends religious service: Not on file    Active member of club or organization: Not on file    Attends meetings of clubs or organizations: Not on file    Relationship status: Not on file  . Intimate partner violence    Fear of current or ex partner: Not on file    Emotionally abused: Not on file    Physically abused: Not on file    Forced sexual activity: Not on file  Other Topics Concern  . Not on file  Social History Narrative  . Not on file    Current Outpatient Medications  Medication Sig Dispense Refill  . albuterol (VENTOLIN HFA) 108 (90 Base) MCG/ACT inhaler INHALE 2 PUFFS BY MOUTH THREE TIMES DAILY AS NEEDED FOR ASTHMA FLARE 8.5 g 1  . aspirin 81 MG tablet Take 81 mg by mouth daily.    . betamethasone, augmented, (DIPROLENE) 0.05 % lotion Apply topically 2 (two) times daily as needed. 60 mL 2  . cetirizine (ZYRTEC) 10 MG tablet Take 10 mg by mouth daily.    . Fluticasone-Salmeterol (ADVAIR) 100-50 MCG/DOSE AEPB Inhale 1 puff into the lungs 2 (two) times daily. 60 each 11  . ketoconazole (NIZORAL) 2 % shampoo Apply 1 application topically 2 (two) times a week. 120 mL 5  . levothyroxine (SYNTHROID) 125 MCG tablet Take 1 tablet by mouth once daily 90 tablet 3  . losartan (COZAAR) 100 MG tablet Take 1 tablet (100 mg total) by mouth daily. 90 tablet 3   No current facility-administered medications for this visit.     Allergies  Allergen Reactions  . Tetracycline Hcl Other (See Comments)    Joint pain (and stiffened them, also)  . Amlodipine Swelling    Lower extremities and body became swollen; can tolerate 5 mg doses, however      Review of Systems:   General:  No  change appetite, good energy, no change weight   Cardiac:  Denies chest pain with exertion, denies chest pain at rest, no SOB with exertion/ resting; occasional palpitations  Respiratory:  Denies shortness of breath,  GI:   History of colonoscopy  GU:   No dysuria,    Vascular:    history of varicose veins  Neuro:   No stroke/TIA's, denies seizures,  Musculoskeletal: Negative  Skin:   Negative  Psych:   Negative  Eyes:   History of cataract surgery  ENT:    last saw dentist 6 months prior  Hematologic:  Negative  Endocrine:  Negative     Physical Exam:   BP (!) 170/66   Pulse (!) 50   Temp 97.8 F (36.6 C) (Skin)   Resp 20   Ht 5\' 6"  (1.676 m)   Wt 67.6 kg   SpO2 98% Comment: RA  BMI 24.05 kg/m   General:    well-appearing  HEENT:   prominent carotid pulsations bilaterally  Neck:   no JVD, no bruits, no adenopathy   Chest:   clear to auscultation, symmetrical breath sounds, no wheezes, no rhonchi   CV:   RRR, 3/6 SEM and 2/6 DM at RSB   Abdomen:  soft, non-tender, no masses   Extremities:  warm, well-perfused, pulses intact throughout, no LE edema  Rectal/GU  Deferred  Neuro:   Grossly non-focal and symmetrical throughout  Skin:   Clean and dry, no rashes, no breakdown   Diagnostic Tests:  Imaging studies personally reviewed including echocardiogram and CT scan; I agree with their interpretation   Impression: 63 yo man with bicuspid aortic valve, root aneurysm and dilated LV secondary to severe, chronic AI.   Plan:  Recommend [place], pending LHC/RHC. He would like to proceed asap.    I spent in excess of 45 minutes during the conduct of this office consultation and >50% of this time involved direct face-to-face encounter with the patient for counseling and/or coordination of their care.          Level 3 Office Consult = 40 minutes         Level 4 Office Consult = 60 minutes         Level 5 Office Consult = 80 minutes B. Murvin Natal, MD 08/14/2019 4:00 PM

## 2019-08-17 ENCOUNTER — Telehealth: Payer: Self-pay | Admitting: *Deleted

## 2019-08-17 DIAGNOSIS — I359 Nonrheumatic aortic valve disorder, unspecified: Secondary | ICD-10-CM

## 2019-08-17 DIAGNOSIS — Z01812 Encounter for preprocedural laboratory examination: Secondary | ICD-10-CM

## 2019-08-17 NOTE — Telephone Encounter (Signed)
I attempted to call the patient to set him up for a cath with Dr. Rockey Situ on Friday (due to the need for his COVID swab). No answer- I left a message to please call back.

## 2019-08-17 NOTE — Telephone Encounter (Signed)
-----   Message from Minna Merritts, MD sent at 08/17/2019  3:15 PM EDT ----- Can we set him up for right and left heart cath possibly this week with me at [facility]? Preop aortic valve surgery Thx TGollan

## 2019-08-18 ENCOUNTER — Encounter: Payer: Self-pay | Admitting: *Deleted

## 2019-08-18 ENCOUNTER — Other Ambulatory Visit: Payer: Self-pay | Admitting: *Deleted

## 2019-08-18 DIAGNOSIS — I351 Nonrheumatic aortic (valve) insufficiency: Secondary | ICD-10-CM

## 2019-08-18 DIAGNOSIS — I712 Thoracic aortic aneurysm, without rupture, unspecified: Secondary | ICD-10-CM

## 2019-08-18 NOTE — Telephone Encounter (Signed)
I spoke with the patient to advise him Dr. Rockey Situ would like to try to do his heart cath on Friday this week. He is aware he will need a COVID swab prior to this procedure being done. The patient states he was just scheduled for pre-surgical dopplers on Thursday this week.  I advised the patient that I will need to speak with PAT to see if we can possibly set him up for a Rapid COVID swab to be done on Thursday 9/24 after his dopplers are done in [place].   He is aware I will call him back after 3:00 pm today.

## 2019-08-18 NOTE — Telephone Encounter (Signed)
I spoke with the patient. He is scheduled for his right & left heart cath on 08/21/19 with Dr. Rockey Situ. He has been instructed to obtain his pre-procedure COVID swab on wedneday 9/23 before 11:00 am at [facility]. He was advised to also get his pre-procedure labs at [facility] just prior to his drive up COVID swab.   He is aware that Dr. Rockey Situ has ok'ed him to go to his pre-surgery dopplers on 9/24. Other than that, he is advised to self isolate once his COVID swab is done.   He was currently driving and unable to take down any other information. He is aware that a detailed instruction letter will be sent to his MyChart to review.  I have asked that he call the office with any questions/ concerns once this is reviewed. He voices understanding of the above and is agreeable.

## 2019-08-18 NOTE — Telephone Encounter (Signed)
Calling back regarding voicemail, unable to be reached from 1-3

## 2019-08-19 ENCOUNTER — Encounter: Payer: Self-pay | Admitting: *Deleted

## 2019-08-19 ENCOUNTER — Other Ambulatory Visit
Admission: RE | Admit: 2019-08-19 | Discharge: 2019-08-19 | Disposition: A | Discharge status: Home / Self Care | Payer: HRSA Program | Source: Ambulatory Visit | Attending: Cardiothoracic Surgery | Admitting: Cardiothoracic Surgery

## 2019-08-19 ENCOUNTER — Other Ambulatory Visit
Admission: RE | Admit: 2019-08-19 | Discharge: 2019-08-19 | Disposition: A | Discharge status: Home / Self Care | Payer: Self-pay | Source: Ambulatory Visit | Attending: Cardiovascular Disease | Admitting: Cardiovascular Disease

## 2019-08-19 ENCOUNTER — Other Ambulatory Visit: Payer: Self-pay | Admitting: *Deleted

## 2019-08-19 ENCOUNTER — Other Ambulatory Visit: Payer: Self-pay

## 2019-08-19 DIAGNOSIS — I351 Nonrheumatic aortic (valve) insufficiency: Secondary | ICD-10-CM

## 2019-08-19 DIAGNOSIS — I712 Thoracic aortic aneurysm, without rupture, unspecified: Secondary | ICD-10-CM

## 2019-08-19 DIAGNOSIS — Z01812 Encounter for preprocedural laboratory examination: Secondary | ICD-10-CM | POA: Diagnosis present

## 2019-08-19 DIAGNOSIS — Z20828 Contact with and (suspected) exposure to other viral communicable diseases: Secondary | ICD-10-CM | POA: Diagnosis not present

## 2019-08-19 DIAGNOSIS — I359 Nonrheumatic aortic valve disorder, unspecified: Secondary | ICD-10-CM | POA: Insufficient documentation

## 2019-08-19 LAB — SARS CORONAVIRUS 2 (TAT 6-24 HRS): SARS Coronavirus 2: NEGATIVE

## 2019-08-19 LAB — CBC WITH DIFFERENTIAL/PLATELET
Abs Immature Granulocytes: 0.02 10*3/uL (ref 0.00–0.07)
Basophils Absolute: 0.1 10*3/uL (ref 0.0–0.1)
Basophils Relative: 1 %
Eosinophils Absolute: 0.4 10*3/uL (ref 0.0–0.5)
Eosinophils Relative: 6 %
HCT: 39.6 % (ref 39.0–52.0)
Hemoglobin: 13.2 g/dL (ref 13.0–17.0)
Immature Granulocytes: 0 %
Lymphocytes Relative: 33 %
Lymphs Abs: 2.3 10*3/uL (ref 0.7–4.0)
MCH: 30.9 pg (ref 26.0–34.0)
MCHC: 33.3 g/dL (ref 30.0–36.0)
MCV: 92.7 fL (ref 80.0–100.0)
Monocytes Absolute: 0.7 10*3/uL (ref 0.1–1.0)
Monocytes Relative: 10 %
Neutro Abs: 3.5 10*3/uL (ref 1.7–7.7)
Neutrophils Relative %: 50 %
Platelets: 216 10*3/uL (ref 150–400)
RBC: 4.27 MIL/uL (ref 4.22–5.81)
RDW: 13.4 % (ref 11.5–15.5)
WBC: 6.9 10*3/uL (ref 4.0–10.5)
nRBC: 0 % (ref 0.0–0.2)

## 2019-08-19 LAB — BASIC METABOLIC PANEL
Anion gap: 9 (ref 5–15)
BUN: 18 mg/dL (ref 8–23)
CO2: 28 mmol/L (ref 22–32)
Calcium: 9.7 mg/dL (ref 8.9–10.3)
Chloride: 104 mmol/L (ref 98–111)
Creatinine, Ser: 0.97 mg/dL (ref 0.61–1.24)
GFR calc Af Amer: >60 mL/min (ref >60)
GFR calc non Af Amer: >60 mL/min (ref >60)
Glucose, Bld: 93 mg/dL (ref 70–99)
Potassium: 4.4 mmol/L (ref 3.5–5.1)
Sodium: 141 mmol/L (ref 135–145)

## 2019-08-20 ENCOUNTER — Ambulatory Visit (HOSPITAL_COMMUNITY)
Admission: RE | Admit: 2019-08-20 | Discharge: 2019-08-20 | Disposition: A | Discharge status: Home / Self Care | Payer: Self-pay | Source: Ambulatory Visit | Attending: Cardiothoracic Surgery | Admitting: Cardiothoracic Surgery

## 2019-08-20 ENCOUNTER — Other Ambulatory Visit: Payer: Self-pay | Admitting: Cardiovascular Disease

## 2019-08-20 DIAGNOSIS — I712 Thoracic aortic aneurysm, without rupture, unspecified: Secondary | ICD-10-CM

## 2019-08-20 DIAGNOSIS — I351 Nonrheumatic aortic (valve) insufficiency: Secondary | ICD-10-CM

## 2019-08-21 ENCOUNTER — Ambulatory Visit
Admission: RE | Admit: 2019-08-21 | Discharge: 2019-08-21 | Disposition: A | Discharge status: Home / Self Care | Payer: Self-pay | Attending: Cardiovascular Disease | Admitting: Cardiovascular Disease

## 2019-08-21 ENCOUNTER — Other Ambulatory Visit: Payer: Self-pay | Admitting: Cardiovascular Disease

## 2019-08-21 ENCOUNTER — Other Ambulatory Visit: Payer: Self-pay | Admitting: *Deleted

## 2019-08-21 ENCOUNTER — Encounter
Admission: RE | Disposition: A | Discharge status: Home / Self Care | Payer: Self-pay | Source: Home / Self Care | Attending: Cardiovascular Disease

## 2019-08-21 ENCOUNTER — Other Ambulatory Visit: Payer: Self-pay

## 2019-08-21 ENCOUNTER — Encounter: Payer: Self-pay | Admitting: *Deleted

## 2019-08-21 DIAGNOSIS — Z888 Allergy status to other drugs, medicaments and biological substances status: Secondary | ICD-10-CM | POA: Insufficient documentation

## 2019-08-21 DIAGNOSIS — N400 Enlarged prostate without lower urinary tract symptoms: Secondary | ICD-10-CM | POA: Insufficient documentation

## 2019-08-21 DIAGNOSIS — I351 Nonrheumatic aortic (valve) insufficiency: Secondary | ICD-10-CM

## 2019-08-21 DIAGNOSIS — I77810 Thoracic aortic ectasia: Secondary | ICD-10-CM | POA: Diagnosis present

## 2019-08-21 DIAGNOSIS — E039 Hypothyroidism, unspecified: Secondary | ICD-10-CM | POA: Insufficient documentation

## 2019-08-21 DIAGNOSIS — Q2381 Bicuspid aortic valve: Secondary | ICD-10-CM

## 2019-08-21 DIAGNOSIS — Z7982 Long term (current) use of aspirin: Secondary | ICD-10-CM | POA: Insufficient documentation

## 2019-08-21 DIAGNOSIS — Z79899 Other long term (current) drug therapy: Secondary | ICD-10-CM | POA: Insufficient documentation

## 2019-08-21 DIAGNOSIS — I25119 Atherosclerotic heart disease of native coronary artery with unspecified angina pectoris: Secondary | ICD-10-CM | POA: Insufficient documentation

## 2019-08-21 DIAGNOSIS — I2510 Atherosclerotic heart disease of native coronary artery without angina pectoris: Secondary | ICD-10-CM

## 2019-08-21 DIAGNOSIS — Z79890 Hormone replacement therapy (postmenopausal): Secondary | ICD-10-CM | POA: Insufficient documentation

## 2019-08-21 DIAGNOSIS — Q231 Congenital insufficiency of aortic valve: Secondary | ICD-10-CM | POA: Insufficient documentation

## 2019-08-21 DIAGNOSIS — Z8249 Family history of ischemic heart disease and other diseases of the circulatory system: Secondary | ICD-10-CM | POA: Insufficient documentation

## 2019-08-21 DIAGNOSIS — Z881 Allergy status to other antibiotic agents status: Secondary | ICD-10-CM | POA: Insufficient documentation

## 2019-08-21 DIAGNOSIS — I712 Thoracic aortic aneurysm, without rupture, unspecified: Secondary | ICD-10-CM

## 2019-08-21 DIAGNOSIS — I10 Essential (primary) hypertension: Secondary | ICD-10-CM | POA: Insufficient documentation

## 2019-08-21 DIAGNOSIS — Z87891 Personal history of nicotine dependence: Secondary | ICD-10-CM | POA: Insufficient documentation

## 2019-08-21 HISTORY — PX: RIGHT/LEFT HEART CATH AND CORONARY ANGIOGRAPHY: CATH118266

## 2019-08-21 SURGERY — RIGHT/LEFT HEART CATH AND CORONARY ANGIOGRAPHY
Anesthesia: Moderate Sedation

## 2019-08-21 SURGERY — RIGHT AND LEFT HEART CATH
Anesthesia: Moderate Sedation

## 2019-08-21 MED ORDER — SODIUM CHLORIDE 0.9 % IV SOLN: 250.0000 mL | INTRAVENOUS | Status: DC | PRN

## 2019-08-21 MED ORDER — MIDAZOLAM HCL 2 MG/2ML IJ SOLN
INTRAMUSCULAR | Status: DC | PRN
  Administered 2019-08-21 (×2): 1 mg via INTRAVENOUS

## 2019-08-21 MED ORDER — HEPARIN (PORCINE) IN NACL 1000-0.9 UT/500ML-% IV SOLN
INTRAVENOUS | Status: AC
  Filled 2019-08-21: qty 1000

## 2019-08-21 MED ORDER — HYDRALAZINE HCL 20 MG/ML IJ SOLN
10.0000 mg | INTRAMUSCULAR | Status: DC | PRN
  Administered 2019-08-21: 13:00:00 10 mg via INTRAVENOUS

## 2019-08-21 MED ORDER — ROSUVASTATIN CALCIUM 10 MG PO TABS: 10.0000 mg | ORAL_TABLET | Freq: Every day | ORAL | 3 refills | Status: DC

## 2019-08-21 MED ORDER — SODIUM CHLORIDE 0.9 % WEIGHT BASED INFUSION
3.0000 mL/kg/h | INTRAVENOUS | Status: AC
  Administered 2019-08-21: 08:00:00 3 mL/kg/h via INTRAVENOUS

## 2019-08-21 MED ORDER — NITROGLYCERIN 0.4 MG SL SUBL
SUBLINGUAL_TABLET | SUBLINGUAL | Status: AC
  Filled 2019-08-21: qty 1

## 2019-08-21 MED ORDER — SODIUM CHLORIDE 0.9 % WEIGHT BASED INFUSION: 1.0000 mL/kg/h | INTRAVENOUS | Status: DC

## 2019-08-21 MED ORDER — FENTANYL CITRATE (PF) 100 MCG/2ML IJ SOLN
INTRAMUSCULAR | Status: AC
  Filled 2019-08-21: qty 2

## 2019-08-21 MED ORDER — ACETAMINOPHEN 325 MG PO TABS: 650.0000 mg | ORAL_TABLET | ORAL | Status: DC | PRN

## 2019-08-21 MED ORDER — NITROGLYCERIN 0.4 MG SL SUBL
SUBLINGUAL_TABLET | SUBLINGUAL | Status: DC | PRN
  Administered 2019-08-21: .4 mg via SUBLINGUAL

## 2019-08-21 MED ORDER — HYDRALAZINE HCL 20 MG/ML IJ SOLN
INTRAMUSCULAR | Status: AC
  Administered 2019-08-21: 13:00:00 10 mg via INTRAVENOUS
  Filled 2019-08-21: qty 1

## 2019-08-21 MED ORDER — LABETALOL HCL 5 MG/ML IV SOLN: 10.0000 mg | INTRAVENOUS | Status: DC | PRN

## 2019-08-21 MED ORDER — SODIUM CHLORIDE 0.9% FLUSH: 3.0000 mL | Freq: Two times a day (BID) | INTRAVENOUS | Status: DC

## 2019-08-21 MED ORDER — IOHEXOL 300 MG/ML  SOLN
INTRAMUSCULAR | Status: DC | PRN
  Administered 2019-08-21: 10:00:00 70 mL via INTRA_ARTERIAL

## 2019-08-21 MED ORDER — ONDANSETRON HCL 4 MG/2ML IJ SOLN: 4.0000 mg | Freq: Four times a day (QID) | INTRAMUSCULAR | Status: DC | PRN

## 2019-08-21 MED ORDER — HEPARIN (PORCINE) IN NACL 2000-0.9 UNIT/L-% IV SOLN
INTRAVENOUS | Status: DC | PRN
  Administered 2019-08-21: 500 mL

## 2019-08-21 MED ORDER — FENTANYL CITRATE (PF) 100 MCG/2ML IJ SOLN
INTRAMUSCULAR | Status: DC | PRN
  Administered 2019-08-21: 25 ug via INTRAVENOUS

## 2019-08-21 MED ORDER — MIDAZOLAM HCL 2 MG/2ML IJ SOLN
INTRAMUSCULAR | Status: AC
  Filled 2019-08-21: qty 2

## 2019-08-21 MED ORDER — NITROGLYCERIN 0.4 MG SL SUBL: 0.4000 mg | SUBLINGUAL_TABLET | SUBLINGUAL | 3 refills | Status: DC | PRN

## 2019-08-21 MED ORDER — SODIUM CHLORIDE 0.9% FLUSH: 3.0000 mL | INTRAVENOUS | Status: DC | PRN

## 2019-08-21 MED ORDER — ASPIRIN 81 MG PO CHEW: 81.0000 mg | CHEWABLE_TABLET | ORAL | Status: DC

## 2019-08-21 SURGICAL SUPPLY — 14 items
CATH INFINITI 5FR ANG PIGTAIL (CATHETERS) ×3 IMPLANT
CATH INFINITI 5FR JL4 (CATHETERS) ×1 IMPLANT
CATH INFINITI 5FR JL5 (CATHETERS) ×3 IMPLANT
CATH INFINITI JR4 5F (CATHETERS) ×3 IMPLANT
CATH SWANZ 7F THERMO (CATHETERS) ×3 IMPLANT
DEVICE CLOSURE MYNXGRIP 5F (Vascular Products) ×2 IMPLANT
KIT MANI 3VAL PERCEP (MISCELLANEOUS) ×3 IMPLANT
KIT RIGHT HEART (MISCELLANEOUS) ×3 IMPLANT
NDL PERC 18GX7CM (NEEDLE) ×1 IMPLANT
NEEDLE PERC 18GX7CM (NEEDLE) ×3 IMPLANT
PACK CARDIAC CATH (CUSTOM PROCEDURE TRAY) ×3 IMPLANT
SHEATH AVANTI 5FR X 11CM (SHEATH) ×3 IMPLANT
SHEATH AVANTI 7FRX11 (SHEATH) ×3 IMPLANT
WIRE GUIDERIGHT .035X150 (WIRE) ×3 IMPLANT

## 2019-08-21 NOTE — Progress Notes (Signed)
Pt in [facility] for pre procedure work up. On room monitor ST elevation noted in V lead, SB 40s. EKG obtained this AM, last EKG 8/24. Elevation appears increased. Pt denies pain, SOB, VSS. Text Page message sent to Dr. Rockey Situ.

## 2019-08-23 NOTE — H&P (Signed)
H&P Addendum, precardiac catheterization  Patient was seen and evaluated prior to Cardiac catheterization procedure Symptoms, prior testing details again confirmed with the patient Patient examined, no significant change from prior exam Lab work reviewed in detail personally by myself Patient understands risk and benefit of the procedure, willing to proceed  Signed, Tim Gollan, MD, Ph.D [facility]    

## 2019-08-24 ENCOUNTER — Encounter: Payer: Self-pay | Admitting: Cardiovascular Disease

## 2019-08-24 NOTE — Progress Notes (Signed)
[place], [place] - [address], [id number], [place] [address] Phone: [phone] Fax: [phone]  [place], [place] - [place] [address] Phone: [phone] Fax: [phone]      Your procedure is scheduled on Thursday, October 1st.  Report to [facility] Main Entrance "A" at 5:30 A.M., and check in at the Admitting office.  Call this number if you have problems the morning of surgery:  [phone]  Call [phone] if you have any questions prior to your surgery date Monday-Friday 8am-4pm    Remember:  Do not eat or drink after midnight the night before your surgery     Take these medicines the morning of surgery with A SIP OF WATER   Albuterol inhaler - if needed  Nitroglycerin - if needed  Synthroid  7 days prior to surgery STOP taking any Aspirin (unless otherwise instructed by your surgeon), Aleve, Naproxen, Ibuprofen, Motrin, Advil, Goody's, BC's, all herbal medications, fish oil, and all vitamins.    The Morning of Surgery  Do not wear jewelry.  Do not wear lotions, powders, colognes, or deodorant  Men may shave face and neck.  Do not bring valuables to the hospital.  [facility] is not responsible for any belongings or valuables.  If you are a smoker, DO NOT Smoke 24 hours prior to surgery IF you wear a CPAP at night please bring your mask, tubing, and machine the morning of surgery   Remember that you must have someone to transport you home after your surgery, and remain with you for 24 hours if you are discharged the same day.   Contacts, glasses, hearing aids, dentures or bridgework may not be worn into surgery.    Leave your suitcase in the car.  After surgery it may be brought to your room.  For patients admitted to the hospital, discharge time will be determined by your treatment team.  Patients discharged the day of surgery will not  be allowed to drive home.    Special instructions:   [facility]- Preparing For Surgery  Before surgery, you can play an important role. Because skin is not sterile, your skin needs to be as free of germs as possible. You can reduce the number of germs on your skin by washing with CHG (chlorahexidine gluconate) Soap before surgery.  CHG is an antiseptic cleaner which kills germs and bonds with the skin to continue killing germs even after washing.    Oral Hygiene is also important to reduce your risk of infection.  Remember - BRUSH YOUR TEETH THE MORNING OF SURGERY WITH YOUR REGULAR TOOTHPASTE  Please do not use if you have an allergy to CHG or antibacterial soaps. If your skin becomes reddened/irritated stop using the CHG.  Do not shave (including legs and underarms) for at least 48 hours prior to first CHG shower. It is OK to shave your face.  Please follow these instructions carefully.   1. Shower the NIGHT BEFORE SURGERY and the MORNING OF SURGERY with CHG Soap.   2. If you chose to wash your hair, wash your hair first as usual with your normal shampoo.  3. After you shampoo, rinse your hair and body thoroughly to remove the shampoo.  4. Use CHG as you would any other liquid soap. You can apply CHG directly to the skin and wash gently with a scrungie or a clean washcloth.  5. Apply the CHG Soap to your body ONLY FROM THE NECK DOWN.  Do not use on open wounds or open sores. Avoid contact with your eyes, ears, mouth and genitals (private parts). Wash Face and genitals (private parts)  with your normal soap.   6. Wash thoroughly, paying special attention to the area where your surgery will be performed.  7. Thoroughly rinse your body with warm water from the neck down.  8. DO NOT shower/wash with your normal soap after using and rinsing off the CHG Soap.  9. Pat yourself dry with a CLEAN TOWEL.  10. Wear CLEAN PAJAMAS to bed the night before surgery, wear comfortable clothes the  morning of surgery  11. Place CLEAN SHEETS on your bed the night of your first shower and DO NOT SLEEP WITH PETS.    Day of Surgery:  Do not apply any deodorants/lotions. Please shower the morning of surgery with the CHG soap  Please wear clean clothes to the hospital/surgery center.   Remember to brush your teeth WITH YOUR REGULAR TOOTHPASTE.   Please read over the following fact sheets that you were given.

## 2019-08-25 ENCOUNTER — Encounter (HOSPITAL_COMMUNITY): Payer: Self-pay

## 2019-08-25 ENCOUNTER — Other Ambulatory Visit (HOSPITAL_COMMUNITY)
Admission: RE | Admit: 2019-08-25 | Discharge: 2019-08-25 | Disposition: A | Discharge status: Home / Self Care | Payer: Self-pay | Source: Ambulatory Visit | Attending: Cardiothoracic Surgery | Admitting: Cardiothoracic Surgery

## 2019-08-25 ENCOUNTER — Other Ambulatory Visit (HOSPITAL_COMMUNITY): Payer: Self-pay | Admitting: *Deleted

## 2019-08-25 ENCOUNTER — Ambulatory Visit (HOSPITAL_COMMUNITY)
Admission: RE | Admit: 2019-08-25 | Discharge: 2019-08-25 | Disposition: A | Discharge status: Home / Self Care | Payer: Self-pay | Source: Ambulatory Visit | Attending: Cardiothoracic Surgery | Admitting: Cardiothoracic Surgery

## 2019-08-25 ENCOUNTER — Other Ambulatory Visit: Payer: Self-pay

## 2019-08-25 ENCOUNTER — Encounter (HOSPITAL_COMMUNITY)
Admission: RE | Admit: 2019-08-25 | Discharge: 2019-08-25 | Disposition: A | Discharge status: Home / Self Care | Payer: Self-pay | Source: Ambulatory Visit | Attending: Cardiothoracic Surgery | Admitting: Cardiothoracic Surgery

## 2019-08-25 DIAGNOSIS — I712 Thoracic aortic aneurysm, without rupture, unspecified: Secondary | ICD-10-CM

## 2019-08-25 DIAGNOSIS — I351 Nonrheumatic aortic (valve) insufficiency: Secondary | ICD-10-CM

## 2019-08-25 DIAGNOSIS — Z20828 Contact with and (suspected) exposure to other viral communicable diseases: Secondary | ICD-10-CM | POA: Insufficient documentation

## 2019-08-25 DIAGNOSIS — Z01818 Encounter for other preprocedural examination: Secondary | ICD-10-CM | POA: Insufficient documentation

## 2019-08-25 HISTORY — DX: Gastro-esophageal reflux disease without esophagitis: K21.9

## 2019-08-25 LAB — CBC
HCT: 38.7 % — ABNORMAL LOW (ref 39.0–52.0)
Hemoglobin: 12.4 g/dL — ABNORMAL LOW (ref 13.0–17.0)
MCH: 31.2 pg (ref 26.0–34.0)
MCHC: 32 g/dL (ref 30.0–36.0)
MCV: 97.2 fL (ref 80.0–100.0)
Platelets: 203 10*3/uL (ref 150–400)
RBC: 3.98 MIL/uL — ABNORMAL LOW (ref 4.22–5.81)
RDW: 13.3 % (ref 11.5–15.5)
WBC: 6.9 10*3/uL (ref 4.0–10.5)
nRBC: 0 % (ref 0.0–0.2)

## 2019-08-25 LAB — BLOOD GAS, ARTERIAL
Acid-Base Excess: 0 mmol/L (ref 0.0–2.0)
Bicarbonate: 24.1 mmol/L (ref 20.0–28.0)
Drawn by: 421801
FIO2: 21
O2 Saturation: 97.8 %
Patient temperature: 98.6
pCO2 arterial: 38.7 mmHg (ref 32.0–48.0)
pH, Arterial: 7.411 (ref 7.350–7.450)
pO2, Arterial: 98.6 mmHg (ref 83.0–108.0)

## 2019-08-25 LAB — PROTIME-INR
INR: 1 (ref 0.8–1.2)
Prothrombin Time: 13.4 seconds (ref 11.4–15.2)

## 2019-08-25 LAB — URINALYSIS, ROUTINE W REFLEX MICROSCOPIC
Bilirubin Urine: NEGATIVE
Glucose, UA: NEGATIVE mg/dL
Hgb urine dipstick: NEGATIVE
Ketones, ur: NEGATIVE mg/dL
Leukocytes,Ua: NEGATIVE
Nitrite: NEGATIVE
Protein, ur: NEGATIVE mg/dL
Specific Gravity, Urine: 1.021 (ref 1.005–1.030)
pH: 5 (ref 5.0–8.0)

## 2019-08-25 LAB — COMPREHENSIVE METABOLIC PANEL
ALT: 12 U/L (ref 0–44)
AST: 17 U/L (ref 15–41)
Albumin: 3.6 g/dL (ref 3.5–5.0)
Alkaline Phosphatase: 75 U/L (ref 38–126)
Anion gap: 9 (ref 5–15)
BUN: 17 mg/dL (ref 8–23)
CO2: 21 mmol/L — ABNORMAL LOW (ref 22–32)
Calcium: 8.7 mg/dL — ABNORMAL LOW (ref 8.9–10.3)
Chloride: 109 mmol/L (ref 98–111)
Creatinine, Ser: 0.75 mg/dL (ref 0.61–1.24)
GFR calc Af Amer: >60 mL/min (ref >60)
GFR calc non Af Amer: >60 mL/min (ref >60)
Glucose, Bld: 101 mg/dL — ABNORMAL HIGH (ref 70–99)
Potassium: 4.5 mmol/L (ref 3.5–5.1)
Sodium: 139 mmol/L (ref 135–145)
Total Bilirubin: 0.3 mg/dL (ref 0.3–1.2)
Total Protein: 6.4 g/dL — ABNORMAL LOW (ref 6.5–8.1)

## 2019-08-25 LAB — HEMOGLOBIN A1C
Hgb A1c MFr Bld: 5.5 % (ref 4.8–5.6)
Mean Plasma Glucose: 111.15 mg/dL

## 2019-08-25 LAB — GLUCOSE, CAPILLARY: Glucose-Capillary: 156 mg/dL — ABNORMAL HIGH (ref 70–99)

## 2019-08-25 LAB — SARS CORONAVIRUS 2 (TAT 6-24 HRS): SARS Coronavirus 2: NEGATIVE

## 2019-08-25 LAB — APTT: aPTT: 31 seconds (ref 24–36)

## 2019-08-25 LAB — SURGICAL PCR SCREEN
MRSA, PCR: NEGATIVE
Staphylococcus aureus: POSITIVE — AB

## 2019-08-25 LAB — ABO/RH: ABO/RH(D): A POS

## 2019-08-25 NOTE — Progress Notes (Signed)
PCP - Viviana Simpler Cardiologist - Ida Rogue   Chest x-ray - 08-25-19 EKG - 08-21-19 Stress Test - 2006 ECHO - 08-18-19 Cardiac Cath - 08-21-19   COVID TEST- 08-25-19   Anesthesia review: yes, heart history  Patient denies shortness of breath, fever, cough and chest pain at PAT appointment   Patient verbalized understanding of instructions that were given to them at the PAT appointment. Patient was also instructed that they will need to review over the PAT instructions again at home before surgery.

## 2019-08-26 ENCOUNTER — Telehealth: Payer: Self-pay | Admitting: Cardiovascular Disease

## 2019-08-26 MED ORDER — DOPAMINE-DEXTROSE 3.2-5 MG/ML-% IV SOLN
0.0000 ug/kg/min | INTRAVENOUS | Status: DC
  Filled 2019-08-26: qty 250

## 2019-08-26 MED ORDER — TRANEXAMIC ACID (OHS) PUMP PRIME SOLUTION
2.0000 mg/kg | INTRAVENOUS | Status: DC
  Filled 2019-08-26: qty 1.42

## 2019-08-26 MED ORDER — VANCOMYCIN HCL 1000 MG IV SOLR
INTRAVENOUS | Status: AC
  Administered 2019-08-27: 1000 mL
  Filled 2019-08-26: qty 1000

## 2019-08-26 MED ORDER — SODIUM CHLORIDE 0.9 % IV SOLN
INTRAVENOUS | Status: DC
  Filled 2019-08-26: qty 30

## 2019-08-26 MED ORDER — SODIUM CHLORIDE 0.9 % IV SOLN
1.5000 g | INTRAVENOUS | Status: AC
  Administered 2019-08-27: 1.5 g via INTRAVENOUS
  Filled 2019-08-26: qty 1.5

## 2019-08-26 MED ORDER — NOREPINEPHRINE 4 MG/250ML-% IV SOLN
0.0000 ug/min | INTRAVENOUS | Status: DC
  Filled 2019-08-26: qty 250

## 2019-08-26 MED ORDER — INSULIN REGULAR(HUMAN) IN NACL 100-0.9 UT/100ML-% IV SOLN
INTRAVENOUS | Status: AC
  Administered 2019-08-27: 09:00:00 .6 [IU]/h via INTRAVENOUS
  Filled 2019-08-26: qty 100

## 2019-08-26 MED ORDER — VANCOMYCIN HCL 10 G IV SOLR
1250.0000 mg | INTRAVENOUS | Status: AC
  Administered 2019-08-27: 07:00:00 1250 mg via INTRAVENOUS
  Filled 2019-08-26: qty 1250

## 2019-08-26 MED ORDER — EPINEPHRINE HCL 5 MG/250ML IV SOLN IN NS
0.0000 ug/min | INTRAVENOUS | Status: DC
  Filled 2019-08-26: qty 250

## 2019-08-26 MED ORDER — DEXMEDETOMIDINE HCL IN NACL 400 MCG/100ML IV SOLN
0.1000 ug/kg/h | INTRAVENOUS | Status: AC
  Administered 2019-08-27: 12:00:00 .5 ug/kg/h via INTRAVENOUS
  Filled 2019-08-26: qty 100

## 2019-08-26 MED ORDER — MANNITOL 20 % IV SOLN
Freq: Once | INTRAVENOUS | Status: DC
  Filled 2019-08-26: qty 13

## 2019-08-26 MED ORDER — SODIUM CHLORIDE 0.9 % IV SOLN
750.0000 mg | INTRAVENOUS | Status: DC
  Filled 2019-08-26: qty 750

## 2019-08-26 MED ORDER — POTASSIUM CHLORIDE 2 MEQ/ML IV SOLN
80.0000 meq | INTRAVENOUS | Status: DC
  Filled 2019-08-26: qty 40

## 2019-08-26 MED ORDER — MILRINONE LACTATE IN DEXTROSE 20-5 MG/100ML-% IV SOLN
0.3000 ug/kg/min | INTRAVENOUS | Status: AC
  Administered 2019-08-27: 13:00:00 0.25 ug/kg/min via INTRAVENOUS
  Filled 2019-08-26: qty 100

## 2019-08-26 MED ORDER — PLASMA-LYTE 148 IV SOLN
INTRAVENOUS | Status: AC
  Administered 2019-08-27: 500 mL
  Filled 2019-08-26: qty 2.5

## 2019-08-26 MED ORDER — NITROGLYCERIN IN D5W 200-5 MCG/ML-% IV SOLN
2.0000 ug/min | INTRAVENOUS | Status: AC
  Administered 2019-08-27: 5 ug/min via INTRAVENOUS
  Filled 2019-08-26: qty 250

## 2019-08-26 MED ORDER — TRANEXAMIC ACID 1000 MG/10ML IV SOLN
1.5000 mg/kg/h | INTRAVENOUS | Status: AC
  Administered 2019-08-27: 09:00:00 1.5 mg/kg/h via INTRAVENOUS
  Filled 2019-08-26: qty 25

## 2019-08-26 MED ORDER — TRANEXAMIC ACID (OHS) BOLUS VIA INFUSION
15.0000 mg/kg | INTRAVENOUS | Status: AC
  Administered 2019-08-27: 08:00:00 1068 mg via INTRAVENOUS
  Filled 2019-08-26: qty 1068

## 2019-08-26 MED ORDER — PHENYLEPHRINE HCL-NACL 20-0.9 MG/250ML-% IV SOLN
30.0000 ug/min | INTRAVENOUS | Status: AC
  Administered 2019-08-27: 08:00:00 20 ug/min via INTRAVENOUS
  Filled 2019-08-26: qty 250

## 2019-08-26 NOTE — Anesthesia Preprocedure Evaluation (Addendum)
Anesthesia Evaluation  Patient identified by MRN, date of birth, ID band Patient awake    Reviewed: Allergy & Precautions, NPO status , Patient's Chart, lab work & pertinent test results  History of Anesthesia Complications Negative for: history of anesthetic complications  Airway Mallampati: II  TM Distance: >3 FB Neck ROM: Full    Dental  (+) Dental Advisory Given, Teeth Intact   Pulmonary COPD,  COPD inhaler, former smoker (quit 1980),  08/25/2019 SARS coronavirus NEG   Pulmonary exam normal breath sounds clear to auscultation       Cardiovascular hypertension, Pt. on medications (-) angina Rhythm:Regular Rate:Normal + Systolic murmurs and + Diastolic murmurs [id number] Cath: EF 45%, Cx 80%, RCA 70% 05/2019 ECHO: EF 50-55%, possible bicuspid aortic valve with severe AI, aortic root 4.9cm   Neuro/Psych negative neurological ROS     GI/Hepatic Neg liver ROS, GERD  Controlled,  Endo/Other  Hypothyroidism   Renal/GU H/o stones     Musculoskeletal   Abdominal   Peds  Hematology negative hematology ROS (+)   Anesthesia Other Findings   Reproductive/Obstetrics                            Anesthesia Physical Anesthesia Plan  ASA: IV  Anesthesia Plan: General   Post-op Pain Management:    Induction: Intravenous  PONV Risk Score and Plan: 2 and Treatment may vary due to age or medical condition  Airway Management Planned: Oral ETT  Additional Equipment: Arterial line, PA Cath, TEE and Ultrasound Guidance Line Placement  Intra-op Plan: Utilization Of Total Body Hypothermia per surgeon request and Delibrate Circulatory arrest per surgeon request  Post-operative Plan: Post-operative intubation/ventilation  Informed Consent: I have reviewed the patients History and Physical, chart, labs and discussed the procedure including the risks, benefits and alternatives for the proposed anesthesia  with the patient or authorized representative who has indicated his/her understanding and acceptance.     Dental advisory given  Plan Discussed with: CRNA and Surgeon  Anesthesia Plan Comments:        Anesthesia Quick Evaluation

## 2019-08-26 NOTE — Telephone Encounter (Signed)
Please call regarding his procedure tomorrow, patient states Dr. Rockey Situ needs an authorized statement from his procedure preformed last week. This is for his insurance. He needs Dr. Rockey Situ to sign off on this procedure so he can proceed with his open heart surgery tomorrow. Please call to discuss

## 2019-08-26 NOTE — Telephone Encounter (Signed)
I spoke with the patient. He states that everything has been taken care of at this time. He had needed Dr. Rockey Situ to sign off on his procedure from last week due to him being a self pay. The hospital was requiring 50% up from for his surgery tomorrow.  The patient needed nothing else from [place] at this time.

## 2019-08-27 ENCOUNTER — Inpatient Hospital Stay (HOSPITAL_COMMUNITY)
Admission: RE | Admit: 2019-08-27 | Discharge: 2019-09-03 | DRG: 220 | Disposition: A | Discharge status: Home / Self Care | Payer: Self-pay | Attending: Cardiothoracic Surgery | Admitting: Cardiothoracic Surgery

## 2019-08-27 ENCOUNTER — Ambulatory Visit (HOSPITAL_COMMUNITY): Payer: Self-pay

## 2019-08-27 ENCOUNTER — Inpatient Hospital Stay (HOSPITAL_COMMUNITY): Payer: Self-pay | Admitting: Physician Assistant

## 2019-08-27 ENCOUNTER — Other Ambulatory Visit: Payer: Self-pay

## 2019-08-27 ENCOUNTER — Inpatient Hospital Stay (HOSPITAL_COMMUNITY)
Admission: RE | Disposition: A | Discharge status: Home / Self Care | Payer: Self-pay | Source: Home / Self Care | Attending: Cardiothoracic Surgery

## 2019-08-27 ENCOUNTER — Encounter (HOSPITAL_COMMUNITY): Payer: Self-pay

## 2019-08-27 ENCOUNTER — Inpatient Hospital Stay (HOSPITAL_COMMUNITY): Payer: Self-pay

## 2019-08-27 ENCOUNTER — Inpatient Hospital Stay (HOSPITAL_COMMUNITY): Payer: Self-pay | Admitting: Certified Registered Nurse Anesthetist

## 2019-08-27 DIAGNOSIS — Z953 Presence of xenogenic heart valve: Secondary | ICD-10-CM

## 2019-08-27 DIAGNOSIS — I351 Nonrheumatic aortic (valve) insufficiency: Secondary | ICD-10-CM

## 2019-08-27 DIAGNOSIS — Z7982 Long term (current) use of aspirin: Secondary | ICD-10-CM

## 2019-08-27 DIAGNOSIS — Z87891 Personal history of nicotine dependence: Secondary | ICD-10-CM

## 2019-08-27 DIAGNOSIS — E8770 Fluid overload, unspecified: Secondary | ICD-10-CM | POA: Diagnosis not present

## 2019-08-27 DIAGNOSIS — Z888 Allergy status to other drugs, medicaments and biological substances status: Secondary | ICD-10-CM

## 2019-08-27 DIAGNOSIS — I2510 Atherosclerotic heart disease of native coronary artery without angina pectoris: Secondary | ICD-10-CM

## 2019-08-27 DIAGNOSIS — I712 Thoracic aortic aneurysm, without rupture, unspecified: Secondary | ICD-10-CM

## 2019-08-27 DIAGNOSIS — K5900 Constipation, unspecified: Secondary | ICD-10-CM | POA: Diagnosis not present

## 2019-08-27 DIAGNOSIS — Z9889 Other specified postprocedural states: Secondary | ICD-10-CM

## 2019-08-27 DIAGNOSIS — Z8679 Personal history of other diseases of the circulatory system: Secondary | ICD-10-CM

## 2019-08-27 DIAGNOSIS — I440 Atrioventricular block, first degree: Secondary | ICD-10-CM | POA: Diagnosis not present

## 2019-08-27 DIAGNOSIS — J939 Pneumothorax, unspecified: Secondary | ICD-10-CM

## 2019-08-27 DIAGNOSIS — E781 Pure hyperglyceridemia: Secondary | ICD-10-CM | POA: Diagnosis present

## 2019-08-27 DIAGNOSIS — I10 Essential (primary) hypertension: Secondary | ICD-10-CM | POA: Diagnosis present

## 2019-08-27 DIAGNOSIS — J45909 Unspecified asthma, uncomplicated: Secondary | ICD-10-CM | POA: Diagnosis present

## 2019-08-27 DIAGNOSIS — Z09 Encounter for follow-up examination after completed treatment for conditions other than malignant neoplasm: Secondary | ICD-10-CM

## 2019-08-27 DIAGNOSIS — E039 Hypothyroidism, unspecified: Secondary | ICD-10-CM | POA: Diagnosis present

## 2019-08-27 DIAGNOSIS — Z9689 Presence of other specified functional implants: Secondary | ICD-10-CM

## 2019-08-27 DIAGNOSIS — D62 Acute posthemorrhagic anemia: Secondary | ICD-10-CM | POA: Diagnosis not present

## 2019-08-27 DIAGNOSIS — N400 Enlarged prostate without lower urinary tract symptoms: Secondary | ICD-10-CM | POA: Diagnosis present

## 2019-08-27 DIAGNOSIS — Z79890 Hormone replacement therapy (postmenopausal): Secondary | ICD-10-CM

## 2019-08-27 DIAGNOSIS — J90 Pleural effusion, not elsewhere classified: Secondary | ICD-10-CM | POA: Diagnosis not present

## 2019-08-27 DIAGNOSIS — Z881 Allergy status to other antibiotic agents status: Secondary | ICD-10-CM

## 2019-08-27 HISTORY — PX: BENTALL PROCEDURE: SHX5058

## 2019-08-27 HISTORY — PX: CORONARY ARTERY BYPASS GRAFT: SHX141

## 2019-08-27 HISTORY — PX: TEE WITHOUT CARDIOVERSION: SHX5443

## 2019-08-27 LAB — HEMOGLOBIN AND HEMATOCRIT, BLOOD
HCT: 26.3 % — ABNORMAL LOW (ref 39.0–52.0)
Hemoglobin: 9.1 g/dL — ABNORMAL LOW (ref 13.0–17.0)

## 2019-08-27 LAB — POCT I-STAT 7, (LYTES, BLD GAS, ICA,H+H)
Acid-Base Excess: 1 mmol/L (ref 0.0–2.0)
Acid-Base Excess: 3 mmol/L — ABNORMAL HIGH (ref 0.0–2.0)
Acid-Base Excess: 11 mmol/L — ABNORMAL HIGH (ref 0.0–2.0)
Acid-Base Excess: 5 mmol/L — ABNORMAL HIGH (ref 0.0–2.0)
Acid-base deficit: 1 mmol/L (ref 0.0–2.0)
Bicarbonate: 26.8 mmol/L (ref 20.0–28.0)
Bicarbonate: 26.9 mmol/L (ref 20.0–28.0)
Bicarbonate: 30.1 mmol/L — ABNORMAL HIGH (ref 20.0–28.0)
Bicarbonate: 35.5 mmol/L — ABNORMAL HIGH (ref 20.0–28.0)
Bicarbonate: 23.2 mmol/L (ref 20.0–28.0)
Calcium, Ion: 1.24 mmol/L (ref 1.15–1.40)
Calcium, Ion: 0.9 mmol/L — ABNORMAL LOW (ref 1.15–1.40)
Calcium, Ion: 0.93 mmol/L — ABNORMAL LOW (ref 1.15–1.40)
Calcium, Ion: 0.99 mmol/L — ABNORMAL LOW (ref 1.15–1.40)
Calcium, Ion: 0.96 mmol/L — ABNORMAL LOW (ref 1.15–1.40)
HCT: 34 % — ABNORMAL LOW (ref 39.0–52.0)
HCT: 26 % — ABNORMAL LOW (ref 39.0–52.0)
HCT: 25 % — ABNORMAL LOW (ref 39.0–52.0)
HCT: 25 % — ABNORMAL LOW (ref 39.0–52.0)
HCT: 23 % — ABNORMAL LOW (ref 39.0–52.0)
Hemoglobin: 11.6 g/dL — ABNORMAL LOW (ref 13.0–17.0)
Hemoglobin: 8.8 g/dL — ABNORMAL LOW (ref 13.0–17.0)
Hemoglobin: 8.5 g/dL — ABNORMAL LOW (ref 13.0–17.0)
Hemoglobin: 8.5 g/dL — ABNORMAL LOW (ref 13.0–17.0)
Hemoglobin: 7.8 g/dL — ABNORMAL LOW (ref 13.0–17.0)
O2 Saturation: 100 %
O2 Saturation: 100 %
O2 Saturation: 100 %
O2 Saturation: 100 %
O2 Saturation: 100 %
Potassium: 4.8 mmol/L (ref 3.5–5.1)
Potassium: 5.1 mmol/L (ref 3.5–5.1)
Potassium: 5.1 mmol/L (ref 3.5–5.1)
Potassium: 5.1 mmol/L (ref 3.5–5.1)
Potassium: 3.4 mmol/L — ABNORMAL LOW (ref 3.5–5.1)
Sodium: 141 mmol/L (ref 135–145)
Sodium: 142 mmol/L (ref 135–145)
Sodium: 138 mmol/L (ref 135–145)
Sodium: 139 mmol/L (ref 135–145)
Sodium: 144 mmol/L (ref 135–145)
TCO2: 28 mmol/L (ref 22–32)
TCO2: 28 mmol/L (ref 22–32)
TCO2: 31 mmol/L (ref 22–32)
TCO2: 37 mmol/L — ABNORMAL HIGH (ref 22–32)
TCO2: 24 mmol/L (ref 22–32)
pCO2 arterial: 49 mmHg — ABNORMAL HIGH (ref 32.0–48.0)
pCO2 arterial: 37.6 mmHg (ref 32.0–48.0)
pCO2 arterial: 45.5 mmHg (ref 32.0–48.0)
pCO2 arterial: 46.7 mmHg (ref 32.0–48.0)
pCO2 arterial: 36.8 mmHg (ref 32.0–48.0)
pH, Arterial: 7.346 — ABNORMAL LOW (ref 7.350–7.450)
pH, Arterial: 7.462 — ABNORMAL HIGH (ref 7.350–7.450)
pH, Arterial: 7.428 (ref 7.350–7.450)
pH, Arterial: 7.489 — ABNORMAL HIGH (ref 7.350–7.450)
pH, Arterial: 7.408 (ref 7.350–7.450)
pO2, Arterial: 527 mmHg — ABNORMAL HIGH (ref 83.0–108.0)
pO2, Arterial: 426 mmHg — ABNORMAL HIGH (ref 83.0–108.0)
pO2, Arterial: 512 mmHg — ABNORMAL HIGH (ref 83.0–108.0)
pO2, Arterial: 521 mmHg — ABNORMAL HIGH (ref 83.0–108.0)
pO2, Arterial: 234 mmHg — ABNORMAL HIGH (ref 83.0–108.0)

## 2019-08-27 LAB — POCT I-STAT 4, (NA,K, GLUC, HGB,HCT)
Glucose, Bld: 91 mg/dL (ref 70–99)
Glucose, Bld: 101 mg/dL — ABNORMAL HIGH (ref 70–99)
Glucose, Bld: 85 mg/dL (ref 70–99)
Glucose, Bld: 116 mg/dL — ABNORMAL HIGH (ref 70–99)
Glucose, Bld: 151 mg/dL — ABNORMAL HIGH (ref 70–99)
Glucose, Bld: 153 mg/dL — ABNORMAL HIGH (ref 70–99)
HCT: 33 % — ABNORMAL LOW (ref 39.0–52.0)
HCT: 24 % — ABNORMAL LOW (ref 39.0–52.0)
HCT: 30 % — ABNORMAL LOW (ref 39.0–52.0)
HCT: 26 % — ABNORMAL LOW (ref 39.0–52.0)
HCT: 29 % — ABNORMAL LOW (ref 39.0–52.0)
HCT: 25 % — ABNORMAL LOW (ref 39.0–52.0)
Hemoglobin: 11.2 g/dL — ABNORMAL LOW (ref 13.0–17.0)
Hemoglobin: 10.2 g/dL — ABNORMAL LOW (ref 13.0–17.0)
Hemoglobin: 8.2 g/dL — ABNORMAL LOW (ref 13.0–17.0)
Hemoglobin: 8.8 g/dL — ABNORMAL LOW (ref 13.0–17.0)
Hemoglobin: 9.9 g/dL — ABNORMAL LOW (ref 13.0–17.0)
Hemoglobin: 8.5 g/dL — ABNORMAL LOW (ref 13.0–17.0)
Potassium: 4.8 mmol/L (ref 3.5–5.1)
Potassium: 5.1 mmol/L (ref 3.5–5.1)
Potassium: 5.3 mmol/L — ABNORMAL HIGH (ref 3.5–5.1)
Potassium: 5 mmol/L (ref 3.5–5.1)
Potassium: 5.6 mmol/L — ABNORMAL HIGH (ref 3.5–5.1)
Potassium: 5.5 mmol/L — ABNORMAL HIGH (ref 3.5–5.1)
Sodium: 140 mmol/L (ref 135–145)
Sodium: 139 mmol/L (ref 135–145)
Sodium: 142 mmol/L (ref 135–145)
Sodium: 136 mmol/L (ref 135–145)
Sodium: 134 mmol/L — ABNORMAL LOW (ref 135–145)
Sodium: 137 mmol/L (ref 135–145)

## 2019-08-27 LAB — CBC
HCT: 25.8 % — ABNORMAL LOW (ref 39.0–52.0)
HCT: 22.9 % — ABNORMAL LOW (ref 39.0–52.0)
Hemoglobin: 8.7 g/dL — ABNORMAL LOW (ref 13.0–17.0)
Hemoglobin: 7.9 g/dL — ABNORMAL LOW (ref 13.0–17.0)
MCH: 31.5 pg (ref 26.0–34.0)
MCH: 31.1 pg (ref 26.0–34.0)
MCHC: 33.7 g/dL (ref 30.0–36.0)
MCHC: 34.5 g/dL (ref 30.0–36.0)
MCV: 93.5 fL (ref 80.0–100.0)
MCV: 90.2 fL (ref 80.0–100.0)
Platelets: 119 10*3/uL — ABNORMAL LOW (ref 150–400)
Platelets: 127 10*3/uL — ABNORMAL LOW (ref 150–400)
RBC: 2.76 MIL/uL — ABNORMAL LOW (ref 4.22–5.81)
RBC: 2.54 MIL/uL — ABNORMAL LOW (ref 4.22–5.81)
RDW: 13.6 % (ref 11.5–15.5)
RDW: 14.3 % (ref 11.5–15.5)
WBC: 14.1 10*3/uL — ABNORMAL HIGH (ref 4.0–10.5)
WBC: 8.2 10*3/uL (ref 4.0–10.5)
nRBC: 0 % (ref 0.0–0.2)
nRBC: 0 % (ref 0.0–0.2)

## 2019-08-27 LAB — ECHO INTRAOPERATIVE TEE
Height: 66 in
Weight: 2512.01 oz

## 2019-08-27 LAB — GLUCOSE, CAPILLARY
Glucose-Capillary: 114 mg/dL — ABNORMAL HIGH (ref 70–99)
Glucose-Capillary: 115 mg/dL — ABNORMAL HIGH (ref 70–99)
Glucose-Capillary: 126 mg/dL — ABNORMAL HIGH (ref 70–99)
Glucose-Capillary: 129 mg/dL — ABNORMAL HIGH (ref 70–99)
Glucose-Capillary: 131 mg/dL — ABNORMAL HIGH (ref 70–99)
Glucose-Capillary: 136 mg/dL — ABNORMAL HIGH (ref 70–99)
Glucose-Capillary: 140 mg/dL — ABNORMAL HIGH (ref 70–99)
Glucose-Capillary: 142 mg/dL — ABNORMAL HIGH (ref 70–99)

## 2019-08-27 LAB — CREATININE, SERUM
Creatinine, Ser: 0.96 mg/dL (ref 0.61–1.24)
GFR calc Af Amer: >60 mL/min (ref >60)
GFR calc non Af Amer: >60 mL/min (ref >60)

## 2019-08-27 LAB — POCT I-STAT, CHEM 8
BUN: 13 mg/dL (ref 8–23)
Calcium, Ion: 0.95 mmol/L — ABNORMAL LOW (ref 1.15–1.40)
Chloride: 104 mmol/L (ref 98–111)
Creatinine, Ser: 0.6 mg/dL — ABNORMAL LOW (ref 0.61–1.24)
Glucose, Bld: 122 mg/dL — ABNORMAL HIGH (ref 70–99)
HCT: 23 % — ABNORMAL LOW (ref 39.0–52.0)
Hemoglobin: 7.8 g/dL — ABNORMAL LOW (ref 13.0–17.0)
Potassium: 3.4 mmol/L — ABNORMAL LOW (ref 3.5–5.1)
Sodium: 143 mmol/L (ref 135–145)
TCO2: 23 mmol/L (ref 22–32)

## 2019-08-27 LAB — APTT: aPTT: 36 seconds (ref 24–36)

## 2019-08-27 LAB — PROTIME-INR
INR: 1.6 — ABNORMAL HIGH (ref 0.8–1.2)
Prothrombin Time: 18.8 seconds — ABNORMAL HIGH (ref 11.4–15.2)

## 2019-08-27 LAB — PLATELET COUNT: Platelets: 130 10*3/uL — ABNORMAL LOW (ref 150–400)

## 2019-08-27 LAB — PREPARE RBC (CROSSMATCH)

## 2019-08-27 LAB — MAGNESIUM: Magnesium: 3 mg/dL — ABNORMAL HIGH (ref 1.7–2.4)

## 2019-08-27 LAB — FIBRINOGEN: Fibrinogen: 221 mg/dL (ref 210–475)

## 2019-08-27 SURGERY — BENTALL PROCEDURE
Anesthesia: General | Site: Chest

## 2019-08-27 MED ORDER — METOPROLOL TARTRATE 25 MG/10 ML ORAL SUSPENSION: 12.5000 mg | Freq: Two times a day (BID) | ORAL | Status: DC

## 2019-08-27 MED ORDER — SODIUM CHLORIDE 0.9% FLUSH
3.0000 mL | Freq: Two times a day (BID) | INTRAVENOUS | Status: DC
  Administered 2019-08-28 – 2019-08-30 (×2): 3 mL via INTRAVENOUS

## 2019-08-27 MED ORDER — METOPROLOL TARTRATE 12.5 MG HALF TABLET
12.5000 mg | ORAL_TABLET | Freq: Two times a day (BID) | ORAL | Status: DC
  Administered 2019-08-28 – 2019-08-30 (×5): 12.5 mg via ORAL
  Filled 2019-08-27 (×5): qty 1

## 2019-08-27 MED ORDER — ALBUMIN HUMAN 5 % IV SOLN
INTRAVENOUS | Status: DC | PRN
  Administered 2019-08-27 (×2): via INTRAVENOUS

## 2019-08-27 MED ORDER — DEXAMETHASONE SODIUM PHOSPHATE 10 MG/ML IJ SOLN
INTRAMUSCULAR | Status: AC
  Filled 2019-08-27: qty 1

## 2019-08-27 MED ORDER — POTASSIUM CHLORIDE 10 MEQ/50ML IV SOLN
10.0000 meq | INTRAVENOUS | Status: AC
  Administered 2019-08-27 (×3): 10 meq via INTRAVENOUS

## 2019-08-27 MED ORDER — FENTANYL CITRATE (PF) 250 MCG/5ML IJ SOLN
INTRAMUSCULAR | Status: AC
  Filled 2019-08-27: qty 5

## 2019-08-27 MED ORDER — MIDAZOLAM HCL (PF) 10 MG/2ML IJ SOLN
INTRAMUSCULAR | Status: AC
  Filled 2019-08-27: qty 2

## 2019-08-27 MED ORDER — CHLORHEXIDINE GLUCONATE 0.12 % MT SOLN
OROMUCOSAL | Status: AC
  Administered 2019-08-27: 15 mL via OROMUCOSAL
  Filled 2019-08-27: qty 15

## 2019-08-27 MED ORDER — VANCOMYCIN HCL IN DEXTROSE 1-5 GM/200ML-% IV SOLN
1000.0000 mg | Freq: Once | INTRAVENOUS | Status: AC
  Administered 2019-08-27: 21:00:00 1000 mg via INTRAVENOUS
  Filled 2019-08-27: qty 200

## 2019-08-27 MED ORDER — TRAMADOL HCL 50 MG PO TABS
50.0000 mg | ORAL_TABLET | ORAL | Status: DC | PRN
  Administered 2019-08-28: 100 mg via ORAL
  Administered 2019-08-30: 50 mg via ORAL
  Filled 2019-08-27: qty 1
  Filled 2019-08-27: qty 2

## 2019-08-27 MED ORDER — OXYCODONE HCL 5 MG PO TABS
5.0000 mg | ORAL_TABLET | ORAL | Status: DC | PRN
  Administered 2019-08-28: 5 mg via ORAL
  Administered 2019-08-28: 10 mg via ORAL
  Administered 2019-08-28 – 2019-08-30 (×9): 5 mg via ORAL
  Filled 2019-08-27 (×10): qty 1
  Filled 2019-08-27: qty 2

## 2019-08-27 MED ORDER — ACETAMINOPHEN 160 MG/5ML PO SOLN
1000.0000 mg | Freq: Four times a day (QID) | ORAL | Status: DC
  Administered 2019-08-27: 1000 mg
  Filled 2019-08-27: qty 40.6

## 2019-08-27 MED ORDER — CHLORHEXIDINE GLUCONATE CLOTH 2 % EX PADS: 6.0000 | MEDICATED_PAD | Freq: Every day | CUTANEOUS | Status: DC

## 2019-08-27 MED ORDER — METOPROLOL TARTRATE 5 MG/5ML IV SOLN: 2.5000 mg | INTRAVENOUS | Status: DC | PRN

## 2019-08-27 MED ORDER — LEVOTHYROXINE SODIUM 25 MCG PO TABS
125.0000 ug | ORAL_TABLET | Freq: Every day | ORAL | Status: DC
  Administered 2019-08-28 – 2019-09-03 (×7): 125 ug via ORAL
  Filled 2019-08-27 (×8): qty 1

## 2019-08-27 MED ORDER — ACETAMINOPHEN 160 MG/5ML PO SOLN: 650.0000 mg | Freq: Once | ORAL | Status: AC

## 2019-08-27 MED ORDER — ACETAMINOPHEN 500 MG PO TABS
1000.0000 mg | ORAL_TABLET | Freq: Four times a day (QID) | ORAL | Status: DC
  Administered 2019-08-28 – 2019-08-30 (×8): 1000 mg via ORAL
  Filled 2019-08-27 (×7): qty 2

## 2019-08-27 MED ORDER — CHLORHEXIDINE GLUCONATE 4 % EX LIQD: 30.0000 mL | CUTANEOUS | Status: DC

## 2019-08-27 MED ORDER — HEMOSTATIC AGENTS (NO CHARGE) OPTIME
TOPICAL | Status: DC | PRN
  Administered 2019-08-27 (×6): 1 via TOPICAL

## 2019-08-27 MED ORDER — EPINEPHRINE 1 MG/10ML IJ SOSY
PREFILLED_SYRINGE | INTRAMUSCULAR | Status: AC
  Filled 2019-08-27: qty 10

## 2019-08-27 MED ORDER — METOPROLOL TARTRATE 12.5 MG HALF TABLET: 12.5000 mg | ORAL_TABLET | Freq: Once | ORAL | Status: DC

## 2019-08-27 MED ORDER — ALBUMIN HUMAN 5 % IV SOLN: 250.0000 mL | INTRAVENOUS | Status: AC | PRN

## 2019-08-27 MED ORDER — LACTATED RINGERS IV SOLN
INTRAVENOUS | Status: DC | PRN
  Administered 2019-08-27: 07:00:00 via INTRAVENOUS

## 2019-08-27 MED ORDER — INSULIN REGULAR BOLUS VIA INFUSION
0.0000 [IU] | Freq: Three times a day (TID) | INTRAVENOUS | Status: DC
  Administered 2019-08-27: 3 [IU] via INTRAVENOUS
  Filled 2019-08-27: qty 10

## 2019-08-27 MED ORDER — 0.9 % SODIUM CHLORIDE (POUR BTL) OPTIME
TOPICAL | Status: DC | PRN
  Administered 2019-08-27: 1000 mL
  Administered 2019-08-27: 09:00:00 5000 mL

## 2019-08-27 MED ORDER — PHENYLEPHRINE 40 MCG/ML (10ML) SYRINGE FOR IV PUSH (FOR BLOOD PRESSURE SUPPORT)
PREFILLED_SYRINGE | INTRAVENOUS | Status: AC
  Filled 2019-08-27: qty 40

## 2019-08-27 MED ORDER — SODIUM CHLORIDE 0.9 % IV SOLN
INTRAVENOUS | Status: DC
  Administered 2019-08-27: 16:00:00 via INTRAVENOUS

## 2019-08-27 MED ORDER — SODIUM CHLORIDE 0.9% IV SOLUTION: Freq: Once | INTRAVENOUS | Status: DC

## 2019-08-27 MED ORDER — ASPIRIN EC 325 MG PO TBEC
325.0000 mg | DELAYED_RELEASE_TABLET | Freq: Every day | ORAL | Status: DC
  Administered 2019-08-28 – 2019-08-30 (×3): 325 mg via ORAL
  Filled 2019-08-27 (×3): qty 1

## 2019-08-27 MED ORDER — PHENYLEPHRINE HCL (PRESSORS) 10 MG/ML IV SOLN
INTRAVENOUS | Status: DC | PRN
  Administered 2019-08-27: 40 ug via INTRAVENOUS
  Administered 2019-08-27: 80 ug via INTRAVENOUS
  Administered 2019-08-27: 40 ug via INTRAVENOUS
  Administered 2019-08-27: 80 ug via INTRAVENOUS
  Administered 2019-08-27 (×2): 40 ug via INTRAVENOUS

## 2019-08-27 MED ORDER — PROPOFOL 10 MG/ML IV BOLUS
INTRAVENOUS | Status: AC
  Filled 2019-08-27: qty 20

## 2019-08-27 MED ORDER — INFLUENZA VAC SPLIT QUAD 0.5 ML IM SUSY: 0.5000 mL | PREFILLED_SYRINGE | INTRAMUSCULAR | Status: DC | PRN

## 2019-08-27 MED ORDER — LIDOCAINE 2% (20 MG/ML) 5 ML SYRINGE
INTRAMUSCULAR | Status: AC
  Filled 2019-08-27: qty 5

## 2019-08-27 MED ORDER — SODIUM CHLORIDE 0.9% FLUSH: 10.0000 mL | INTRAVENOUS | Status: DC | PRN

## 2019-08-27 MED ORDER — PROPOFOL 10 MG/ML IV BOLUS
INTRAVENOUS | Status: DC | PRN
  Administered 2019-08-27 (×2): 30 mg via INTRAVENOUS
  Administered 2019-08-27 (×2): 20 mg via INTRAVENOUS

## 2019-08-27 MED ORDER — LIDOCAINE 2% (20 MG/ML) 5 ML SYRINGE
INTRAMUSCULAR | Status: AC
  Filled 2019-08-27: qty 10

## 2019-08-27 MED ORDER — MAGNESIUM SULFATE 4 GM/100ML IV SOLN
4.0000 g | Freq: Once | INTRAVENOUS | Status: AC
  Administered 2019-08-27: 16:00:00 4 g via INTRAVENOUS

## 2019-08-27 MED ORDER — DEXMEDETOMIDINE HCL IN NACL 200 MCG/50ML IV SOLN
0.0000 ug/kg/h | INTRAVENOUS | Status: DC
  Administered 2019-08-27: 0.7 ug/kg/h via INTRAVENOUS
  Administered 2019-08-27: 18:00:00 0.5 ug/kg/h via INTRAVENOUS
  Filled 2019-08-27 (×3): qty 50

## 2019-08-27 MED ORDER — MILRINONE LACTATE IN DEXTROSE 20-5 MG/100ML-% IV SOLN
0.1250 ug/kg/min | INTRAVENOUS | Status: DC
  Administered 2019-08-28: 01:00:00 0.25 ug/kg/min via INTRAVENOUS
  Administered 2019-08-28: 22:00:00 0.125 ug/kg/min via INTRAVENOUS
  Filled 2019-08-27 (×3): qty 100

## 2019-08-27 MED ORDER — FENTANYL CITRATE (PF) 250 MCG/5ML IJ SOLN
INTRAMUSCULAR | Status: AC
  Filled 2019-08-27: qty 25

## 2019-08-27 MED ORDER — HEPARIN SODIUM (PORCINE) 1000 UNIT/ML IJ SOLN
INTRAMUSCULAR | Status: AC
  Filled 2019-08-27: qty 1

## 2019-08-27 MED ORDER — GLYCOPYRROLATE PF 0.2 MG/ML IJ SOSY
PREFILLED_SYRINGE | INTRAMUSCULAR | Status: AC
  Filled 2019-08-27: qty 1

## 2019-08-27 MED ORDER — INSULIN REGULAR(HUMAN) IN NACL 100-0.9 UT/100ML-% IV SOLN: INTRAVENOUS | Status: DC

## 2019-08-27 MED ORDER — EPINEPHRINE PF 1 MG/ML IJ SOLN
INTRAVENOUS | Status: DC | PRN
  Administered 2019-08-27: 13:00:00 1 ug/kg/min via INTRAVENOUS

## 2019-08-27 MED ORDER — ARTIFICIAL TEARS OPHTHALMIC OINT
TOPICAL_OINTMENT | OPHTHALMIC | Status: AC
  Filled 2019-08-27: qty 3.5

## 2019-08-27 MED ORDER — MAGNESIUM SULFATE 4 GM/100ML IV SOLN
INTRAVENOUS | Status: AC
  Administered 2019-08-27: 16:00:00 4 g via INTRAVENOUS
  Filled 2019-08-27: qty 100

## 2019-08-27 MED ORDER — ROCURONIUM BROMIDE 10 MG/ML (PF) SYRINGE
PREFILLED_SYRINGE | INTRAVENOUS | Status: AC
  Filled 2019-08-27: qty 10

## 2019-08-27 MED ORDER — PHENYLEPHRINE HCL-NACL 20-0.9 MG/250ML-% IV SOLN
0.0000 ug/min | INTRAVENOUS | Status: DC
  Filled 2019-08-27: qty 250

## 2019-08-27 MED ORDER — FENTANYL CITRATE (PF) 250 MCG/5ML IJ SOLN
INTRAMUSCULAR | Status: DC | PRN
  Administered 2019-08-27: 150 ug via INTRAVENOUS
  Administered 2019-08-27 (×4): 100 ug via INTRAVENOUS
  Administered 2019-08-27: 200 ug via INTRAVENOUS
  Administered 2019-08-27: 50 ug via INTRAVENOUS
  Administered 2019-08-27 (×2): 100 ug via INTRAVENOUS
  Administered 2019-08-27: 50 ug via INTRAVENOUS
  Administered 2019-08-27: 150 ug via INTRAVENOUS
  Administered 2019-08-27: 100 ug via INTRAVENOUS
  Administered 2019-08-27: 200 ug via INTRAVENOUS

## 2019-08-27 MED ORDER — LACTATED RINGERS IV SOLN
INTRAVENOUS | Status: DC
  Administered 2019-08-28 – 2019-08-30 (×2): via INTRAVENOUS

## 2019-08-27 MED ORDER — ACETAMINOPHEN 650 MG RE SUPP
650.0000 mg | Freq: Once | RECTAL | Status: AC
  Administered 2019-08-27: 650 mg via RECTAL

## 2019-08-27 MED ORDER — SODIUM CHLORIDE 0.9 % IV SOLN
INTRAVENOUS | Status: DC | PRN
  Administered 2019-08-27 (×3): via INTRAVENOUS

## 2019-08-27 MED ORDER — SODIUM CHLORIDE 0.45 % IV SOLN
INTRAVENOUS | Status: DC | PRN
  Administered 2019-08-27: 16:00:00 via INTRAVENOUS

## 2019-08-27 MED ORDER — BISACODYL 5 MG PO TBEC
10.0000 mg | DELAYED_RELEASE_TABLET | Freq: Every day | ORAL | Status: DC
  Administered 2019-08-28 – 2019-08-30 (×3): 10 mg via ORAL
  Filled 2019-08-27 (×3): qty 2

## 2019-08-27 MED ORDER — HEPARIN SODIUM (PORCINE) 1000 UNIT/ML IJ SOLN
INTRAMUSCULAR | Status: DC | PRN
  Administered 2019-08-27: 25000 [IU] via INTRAVENOUS

## 2019-08-27 MED ORDER — MIDAZOLAM HCL 5 MG/5ML IJ SOLN
INTRAMUSCULAR | Status: DC | PRN
  Administered 2019-08-27 (×5): 2 mg via INTRAVENOUS

## 2019-08-27 MED ORDER — ROCURONIUM BROMIDE 10 MG/ML (PF) SYRINGE
PREFILLED_SYRINGE | INTRAVENOUS | Status: AC
  Filled 2019-08-27: qty 50

## 2019-08-27 MED ORDER — FAMOTIDINE IN NACL 20-0.9 MG/50ML-% IV SOLN
20.0000 mg | Freq: Two times a day (BID) | INTRAVENOUS | Status: DC
  Administered 2019-08-27: 21:00:00 20 mg via INTRAVENOUS
  Filled 2019-08-27: qty 50

## 2019-08-27 MED ORDER — EPINEPHRINE HCL 5 MG/250ML IV SOLN IN NS
0.5000 ug/min | INTRAVENOUS | Status: DC
  Administered 2019-08-28: 5 ug/min via INTRAVENOUS
  Filled 2019-08-27: qty 250

## 2019-08-27 MED ORDER — PROTAMINE SULFATE 10 MG/ML IV SOLN
INTRAVENOUS | Status: DC | PRN
  Administered 2019-08-27: 50 mg via INTRAVENOUS
  Administered 2019-08-27 (×2): 100 mg via INTRAVENOUS

## 2019-08-27 MED ORDER — CHLORHEXIDINE GLUCONATE 0.12 % MT SOLN
15.0000 mL | OROMUCOSAL | Status: AC
  Administered 2019-08-28: 15 mL via OROMUCOSAL

## 2019-08-27 MED ORDER — ONDANSETRON HCL 4 MG/2ML IJ SOLN
INTRAMUSCULAR | Status: AC
  Filled 2019-08-27: qty 2

## 2019-08-27 MED ORDER — MIDAZOLAM HCL 2 MG/2ML IJ SOLN: 2.0000 mg | INTRAMUSCULAR | Status: DC | PRN

## 2019-08-27 MED ORDER — SODIUM CHLORIDE (PF) 0.9 % IJ SOLN
INTRAMUSCULAR | Status: AC
  Filled 2019-08-27: qty 30

## 2019-08-27 MED ORDER — SODIUM CHLORIDE 0.9 % IV SOLN
INTRAVENOUS | Status: DC | PRN
  Administered 2019-08-27: 14:00:00 750 mg via INTRAVENOUS

## 2019-08-27 MED ORDER — MORPHINE SULFATE (PF) 2 MG/ML IV SOLN
1.0000 mg | INTRAVENOUS | Status: DC | PRN
  Administered 2019-08-27: 20:00:00 2 mg via INTRAVENOUS
  Filled 2019-08-27: qty 1

## 2019-08-27 MED ORDER — LACTATED RINGERS IV SOLN: INTRAVENOUS | Status: DC

## 2019-08-27 MED ORDER — ARTIFICIAL TEARS OPHTHALMIC OINT
TOPICAL_OINTMENT | OPHTHALMIC | Status: DC | PRN
  Administered 2019-08-27: 1 via OPHTHALMIC

## 2019-08-27 MED ORDER — SODIUM CHLORIDE 0.9% FLUSH
10.0000 mL | Freq: Two times a day (BID) | INTRAVENOUS | Status: DC
  Administered 2019-08-28 – 2019-08-29 (×2): 10 mL

## 2019-08-27 MED ORDER — ASPIRIN 81 MG PO CHEW: 324.0000 mg | CHEWABLE_TABLET | Freq: Every day | ORAL | Status: DC

## 2019-08-27 MED ORDER — SODIUM CHLORIDE 0.9 % IV SOLN
1.5000 g | Freq: Two times a day (BID) | INTRAVENOUS | Status: AC
  Administered 2019-08-27 – 2019-08-29 (×3): 1.5 g via INTRAVENOUS
  Filled 2019-08-27 (×4): qty 1.5

## 2019-08-27 MED ORDER — SODIUM CHLORIDE 0.9% FLUSH: 3.0000 mL | INTRAVENOUS | Status: DC | PRN

## 2019-08-27 MED ORDER — BISACODYL 10 MG RE SUPP: 10.0000 mg | Freq: Every day | RECTAL | Status: DC

## 2019-08-27 MED ORDER — SODIUM CHLORIDE 0.9 % IV SOLN: 250.0000 mL | INTRAVENOUS | Status: DC

## 2019-08-27 MED ORDER — CHLORHEXIDINE GLUCONATE CLOTH 2 % EX PADS
6.0000 | MEDICATED_PAD | Freq: Every day | CUTANEOUS | Status: DC
  Administered 2019-08-29: 6 via TOPICAL

## 2019-08-27 MED ORDER — METOPROLOL TARTRATE 12.5 MG HALF TABLET
ORAL_TABLET | ORAL | Status: AC
  Filled 2019-08-27: qty 1

## 2019-08-27 MED ORDER — CHLORHEXIDINE GLUCONATE 0.12 % MT SOLN
15.0000 mL | Freq: Once | OROMUCOSAL | Status: AC
  Administered 2019-08-27: 06:00:00 15 mL via OROMUCOSAL

## 2019-08-27 MED ORDER — SODIUM CHLORIDE 0.9% IV SOLUTION
Freq: Once | INTRAVENOUS | Status: AC
  Administered 2019-08-27: 18:00:00 via INTRAVENOUS

## 2019-08-27 MED ORDER — HYDROCORTISONE NA SUCCINATE PF 100 MG IJ SOLR
INTRAMUSCULAR | Status: DC | PRN
  Administered 2019-08-27: 250 mg via INTRAVENOUS

## 2019-08-27 MED ORDER — PANTOPRAZOLE SODIUM 40 MG PO TBEC
40.0000 mg | DELAYED_RELEASE_TABLET | Freq: Every day | ORAL | Status: DC
  Administered 2019-08-29 – 2019-08-30 (×2): 40 mg via ORAL
  Filled 2019-08-27 (×3): qty 1

## 2019-08-27 MED ORDER — DOCUSATE SODIUM 100 MG PO CAPS
200.0000 mg | ORAL_CAPSULE | Freq: Every day | ORAL | Status: DC
  Administered 2019-08-28 – 2019-08-30 (×3): 200 mg via ORAL
  Filled 2019-08-27 (×3): qty 2

## 2019-08-27 MED ORDER — ONDANSETRON HCL 4 MG/2ML IJ SOLN
4.0000 mg | Freq: Four times a day (QID) | INTRAMUSCULAR | Status: DC | PRN
  Administered 2019-08-28 – 2019-08-30 (×3): 4 mg via INTRAVENOUS
  Filled 2019-08-27 (×2): qty 2

## 2019-08-27 MED ORDER — LACTATED RINGERS IV SOLN: 500.0000 mL | Freq: Once | INTRAVENOUS | Status: DC | PRN

## 2019-08-27 MED ORDER — NITROGLYCERIN IN D5W 200-5 MCG/ML-% IV SOLN: 0.0000 ug/min | INTRAVENOUS | Status: DC

## 2019-08-27 MED ORDER — ROCURONIUM BROMIDE 10 MG/ML (PF) SYRINGE
PREFILLED_SYRINGE | INTRAVENOUS | Status: DC | PRN
  Administered 2019-08-27: 60 mg via INTRAVENOUS
  Administered 2019-08-27: 50 mg via INTRAVENOUS
  Administered 2019-08-27: 40 mg via INTRAVENOUS
  Administered 2019-08-27: 50 mg via INTRAVENOUS
  Administered 2019-08-27: 30 mg via INTRAVENOUS

## 2019-08-27 SURGICAL SUPPLY — 146 items
ADAPTER CARDIO PERF ANTE/RETRO (ADAPTER) ×3 IMPLANT
ADH SKN CLS APL DERMABOND .7 (GAUZE/BANDAGES/DRESSINGS) ×2
ADPR PRFSN 84XANTGRD RTRGD (ADAPTER) ×2
APL SRG 7X2 LUM MLBL SLNT (VASCULAR PRODUCTS) ×2
APPLICATOR TIP COSEAL (VASCULAR PRODUCTS) ×1 IMPLANT
BAG DECANTER FOR FLEXI CONT (MISCELLANEOUS) ×4 IMPLANT
BANDAGE HEMOSTAT MRDH 4X4 STRL (MISCELLANEOUS) IMPLANT
BASKET HEART (ORDER IN 25'S) (MISCELLANEOUS) ×1
BASKET HEART (ORDER IN 25S) (MISCELLANEOUS) ×2 IMPLANT
BLADE CLIPPER SURG (BLADE) ×2 IMPLANT
BLADE STERNUM SYSTEM 6 (BLADE) ×3 IMPLANT
BLADE SURG 15 STRL LF DISP TIS (BLADE) ×2 IMPLANT
BLADE SURG 15 STRL SS (BLADE) ×3
BNDG CMPR MED 10X6 ELC LF (GAUZE/BANDAGES/DRESSINGS) ×2
BNDG ELASTIC 4X5.8 VLCR STR LF (GAUZE/BANDAGES/DRESSINGS) ×3 IMPLANT
BNDG ELASTIC 6X10 VLCR STRL LF (GAUZE/BANDAGES/DRESSINGS) ×1 IMPLANT
BNDG ELASTIC 6X5.8 VLCR STR LF (GAUZE/BANDAGES/DRESSINGS) ×3 IMPLANT
BNDG GAUZE ELAST 4 BULKY (GAUZE/BANDAGES/DRESSINGS) ×3 IMPLANT
BNDG HEMOSTAT MRDH 4X4 STRL (MISCELLANEOUS) ×3
CANISTER SUCT 3000ML PPV (MISCELLANEOUS) ×3 IMPLANT
CANNULA SUMP PERICARDIAL (CANNULA) ×1 IMPLANT
CATH CPB KIT HENDRICKSON (MISCELLANEOUS) ×3 IMPLANT
CATH HEART VENT LEFT (CATHETERS) ×2 IMPLANT
CATH RETROPLEGIA CORONARY 14FR (CATHETERS) ×3 IMPLANT
CATH ROBINSON RED A/P 18FR (CATHETERS) ×9 IMPLANT
CAUTERY HIGH TEMP VAS (MISCELLANEOUS) ×3 IMPLANT
CAUTERY SURG HI TEMP FINE TIP (MISCELLANEOUS) ×1 IMPLANT
CLIP RETRACTION 3.0MM CORONARY (MISCELLANEOUS) ×3 IMPLANT
CLIP VESOCCLUDE LG 6/CT (CLIP) ×1 IMPLANT
CLIP VESOCCLUDE SM WIDE 24/CT (CLIP) ×2 IMPLANT
CONN ST 1/4X3/8  BEN (MISCELLANEOUS) ×2
CONN ST 1/4X3/8 BEN (MISCELLANEOUS) IMPLANT
CONT SPEC 4OZ CLIKSEAL STRL BL (MISCELLANEOUS) ×4 IMPLANT
COVER WAND RF STERILE (DRAPES) ×2 IMPLANT
DERMABOND ADVANCED (GAUZE/BANDAGES/DRESSINGS) ×1
DERMABOND ADVANCED .7 DNX12 (GAUZE/BANDAGES/DRESSINGS) ×2 IMPLANT
DEVICE SUT CK QUICK LOAD INDV (Prosthesis & Implant Heart) ×2 IMPLANT
DEVICE SUT CK QUICK LOAD MINI (Prosthesis & Implant Heart) ×1 IMPLANT
DRAIN CHANNEL 28F RND 3/8 FF (WOUND CARE) ×8 IMPLANT
DRAPE CARDIOVASCULAR INCISE (DRAPES) ×3
DRAPE SLUSH/WARMER DISC (DRAPES) ×3 IMPLANT
DRAPE SRG 135X102X78XABS (DRAPES) ×2 IMPLANT
DRSG AQUACEL AG ADV 3.5X 6 (GAUZE/BANDAGES/DRESSINGS) ×1 IMPLANT
DRSG AQUACEL AG ADV 3.5X14 (GAUZE/BANDAGES/DRESSINGS) ×3 IMPLANT
DRSG COVADERM 4X14 (GAUZE/BANDAGES/DRESSINGS) ×2 IMPLANT
ELECT CAUTERY BLADE 6.4 (BLADE) ×2 IMPLANT
ELECT REM PT RETURN 9FT ADLT (ELECTROSURGICAL) ×6
ELECTRODE REM PT RTRN 9FT ADLT (ELECTROSURGICAL) ×4 IMPLANT
FELT TEFLON 1X6 (MISCELLANEOUS) ×7 IMPLANT
GAUZE SPONGE 4X4 12PLY STRL (GAUZE/BANDAGES/DRESSINGS) ×5 IMPLANT
GAUZE SPONGE 4X4 12PLY STRL LF (GAUZE/BANDAGES/DRESSINGS) ×1 IMPLANT
GLOVE BIO SURGEON STRL SZ 6 (GLOVE) ×1 IMPLANT
GLOVE BIO SURGEON STRL SZ 6.5 (GLOVE) ×2 IMPLANT
GLOVE BIO SURGEON STRL SZ7 (GLOVE) IMPLANT
GLOVE BIO SURGEON STRL SZ7.5 (GLOVE) IMPLANT
GLOVE BIOGEL PI IND STRL 6 (GLOVE) IMPLANT
GLOVE BIOGEL PI IND STRL 7.5 (GLOVE) IMPLANT
GLOVE BIOGEL PI IND STRL 8 (GLOVE) IMPLANT
GLOVE BIOGEL PI INDICATOR 6 (GLOVE) ×2
GLOVE BIOGEL PI INDICATOR 7.5 (GLOVE) ×1
GLOVE BIOGEL PI INDICATOR 8 (GLOVE) ×1
GLOVE NEODERM STRL 7.5 LF PF (GLOVE) ×6 IMPLANT
GLOVE SURG NEODERM 7.5  LF PF (GLOVE) ×3
GLOVE SURG SS PI 7.5 STRL IVOR (GLOVE) ×2 IMPLANT
GOWN STRL REUS W/ TWL LRG LVL3 (GOWN DISPOSABLE) ×8 IMPLANT
GOWN STRL REUS W/TWL LRG LVL3 (GOWN DISPOSABLE) ×45
GRAFT HEMASHIELD 10MM (Graft) ×3 IMPLANT
GRAFT VASC STRG 30X10STRL (Graft) IMPLANT
GRAFT WOVEN D/V 30DX30L (Vascular Products) ×1 IMPLANT
HEMOSTAT POWDER SURGIFOAM 1G (HEMOSTASIS) ×9 IMPLANT
HEMOSTAT SURGICEL 2X14 (HEMOSTASIS) ×3 IMPLANT
INSERT FOGARTY SM (MISCELLANEOUS) ×2 IMPLANT
INSERT FOGARTY XLG (MISCELLANEOUS) ×1 IMPLANT
KIT BASIN OR (CUSTOM PROCEDURE TRAY) ×3 IMPLANT
KIT SUCTION CATH 14FR (SUCTIONS) ×3 IMPLANT
KIT SUT CK MINI COMBO 4X17 (Prosthesis & Implant Heart) ×1 IMPLANT
KIT TURNOVER KIT B (KITS) ×3 IMPLANT
KIT VASOVIEW HEMOPRO 2 VH 4000 (KITS) ×3 IMPLANT
LEAD PACING MYOCARDI (MISCELLANEOUS) ×2 IMPLANT
LINE VENT (MISCELLANEOUS) ×1 IMPLANT
LOOP VESSEL MAXI BLUE (MISCELLANEOUS) ×1 IMPLANT
MARKER GRAFT CORONARY BYPASS (MISCELLANEOUS) ×8 IMPLANT
NEEDLE AORTIC AIR ASPIRATING (NEEDLE) ×1 IMPLANT
NS IRRIG 1000ML POUR BTL (IV SOLUTION) ×16 IMPLANT
PACK E OPEN HEART (SUTURE) ×3 IMPLANT
PACK OPEN HEART (CUSTOM PROCEDURE TRAY) ×3 IMPLANT
PACK SPY-PHI (KITS) ×1 IMPLANT
PAD ARMBOARD 7.5X6 YLW CONV (MISCELLANEOUS) ×6 IMPLANT
PAD ELECT DEFIB RADIOL ZOLL (MISCELLANEOUS) ×3 IMPLANT
PENCIL BUTTON HOLSTER BLD 10FT (ELECTRODE) ×3 IMPLANT
POSITIONER HEAD DONUT 9IN (MISCELLANEOUS) ×3 IMPLANT
POWDER SURGICEL 3.0 GRAM (HEMOSTASIS) ×1 IMPLANT
RELOAD EGIA 45 TAN VASC (STAPLE) ×1 IMPLANT
SEALANT SURG COSEAL 8ML (VASCULAR PRODUCTS) ×1 IMPLANT
SET CARDIOPLEGIA MPS 5001102 (MISCELLANEOUS) ×1 IMPLANT
SPONGE LAP 18X18 RF (DISPOSABLE) ×1 IMPLANT
SPONGE LAP 18X18 X RAY DECT (DISPOSABLE) ×1 IMPLANT
SPONGE LAP 4X18 RFD (DISPOSABLE) ×1 IMPLANT
STAPLER ENDO GIA 12 SHRT THIN (STAPLE) IMPLANT
STAPLER ENDO GIA 12MM SHORT (STAPLE) ×3 IMPLANT
SUT BONE WAX W31G (SUTURE) ×3 IMPLANT
SUT ETHIBON 2 0 V 52N 30 (SUTURE) ×6 IMPLANT
SUT ETHIBON EXCEL 2-0 V-5 (SUTURE) IMPLANT
SUT ETHIBOND V-5 VALVE (SUTURE) IMPLANT
SUT ETHIBOND X763 2 0 SH 1 (SUTURE) ×1 IMPLANT
SUT MNCRL AB 3-0 PS2 18 (SUTURE) ×7 IMPLANT
SUT PDS AB 1 CTX 36 (SUTURE) ×6 IMPLANT
SUT PROLENE 3 0 SH 1 (SUTURE) ×3 IMPLANT
SUT PROLENE 3 0 SH 48 (SUTURE) ×8 IMPLANT
SUT PROLENE 3 0 SH DA (SUTURE) ×3 IMPLANT
SUT PROLENE 4 0 RB 1 (SUTURE) ×27
SUT PROLENE 4 0 SH DA (SUTURE) ×3 IMPLANT
SUT PROLENE 4-0 RB1 .5 CRCL 36 (SUTURE) ×8 IMPLANT
SUT PROLENE 5 0 C 1 36 (SUTURE) ×5 IMPLANT
SUT PROLENE 6 0 C 1 30 (SUTURE) ×11 IMPLANT
SUT PROLENE 8 0 BV175 6 (SUTURE) IMPLANT
SUT PROLENE BLUE 7 0 (SUTURE) ×3 IMPLANT
SUT SILK 2 0 SH CR/8 (SUTURE) ×2 IMPLANT
SUT SILK 3 0 SH CR/8 (SUTURE) ×1 IMPLANT
SUT STEEL 6MS V (SUTURE) ×3 IMPLANT
SUT STEEL STERNAL CCS#1 18IN (SUTURE) IMPLANT
SUT STEEL SZ 6 DBL 3X14 BALL (SUTURE) ×3 IMPLANT
SUT VIC AB 0 CT1 27 (SUTURE) ×3
SUT VIC AB 0 CT1 27XBRD ANBCTR (SUTURE) IMPLANT
SUT VIC AB 1 CTX 36 (SUTURE) ×6
SUT VIC AB 1 CTX36XBRD ANBCTR (SUTURE) ×4 IMPLANT
SUT VIC AB 2-0 CT1 27 (SUTURE) ×3
SUT VIC AB 2-0 CT1 TAPERPNT 27 (SUTURE) IMPLANT
SUT VIC AB 2-0 CTX 27 (SUTURE) IMPLANT
SUT VIC AB 3-0 X1 27 (SUTURE) ×1 IMPLANT
SYR 10ML LL (SYRINGE) ×1 IMPLANT
SYSTEM SAHARA CHEST DRAIN ATS (WOUND CARE) ×3 IMPLANT
TAPE CLOTH SURG 4X10 WHT LF (GAUZE/BANDAGES/DRESSINGS) ×1 IMPLANT
TAPE PAPER 2X10 WHT MICROPORE (GAUZE/BANDAGES/DRESSINGS) ×1 IMPLANT
TOWEL GREEN STERILE (TOWEL DISPOSABLE) ×3 IMPLANT
TOWEL GREEN STERILE FF (TOWEL DISPOSABLE) ×3 IMPLANT
TRAY FOLEY SLVR 14FR TEMP STAT (SET/KITS/TRAYS/PACK) ×2 IMPLANT
TRAY FOLEY SLVR 16FR TEMP STAT (SET/KITS/TRAYS/PACK) ×3 IMPLANT
TUBE CONNECTING 20X1/4 (TUBING) ×1 IMPLANT
TUBING LAP HI FLOW INSUFFLATIO (TUBING) ×3 IMPLANT
UNDERPAD 30X30 (UNDERPADS AND DIAPERS) ×3 IMPLANT
VALVE AORTIC KONECT RESILIA 27 (Valve) ×1 IMPLANT
VENT LEFT HEART 12002 (CATHETERS) ×3
WATER STERILE IRR 1000ML POUR (IV SOLUTION) ×6 IMPLANT
WATER STERILE IRR 1000ML UROMA (IV SOLUTION) IMPLANT
YANKAUER SUCT BULB TIP NO VENT (SUCTIONS) ×1 IMPLANT

## 2019-08-27 NOTE — OR Nursing (Signed)
SPY-PHI Kit (exp: 09/26/2021) used with Indocyanine green (exp: 04/2022) and diluted with 20 mLs of sterile water (09/26/2021 and 12/27/2021). Removal of 0.5 mL placed in 1000 mL IV sterile water (exp: 06/2020), then remove 30 mLs of mixture to be placed on sterile field to mix with 20 mLs of patient's heparinized blood for total of 50 mLs. Procedure will be performed by Dr. Fredrich Romans, MD.

## 2019-08-27 NOTE — Anesthesia Procedure Notes (Signed)
Central Venous Catheter Insertion Performed by: Annye Asa, MD, anesthesiologist Start/End10/11/2018 6:48 AM, 08/27/2019 7:10 AM Patient location: Pre-op. Preanesthetic checklist: patient identified, IV checked, risks and benefits discussed, surgical consent, monitors and equipment checked, pre-op evaluation, timeout performed and anesthesia consent Position: supine Lidocaine 1% used for infiltration and patient sedated Hand hygiene performed , maximum sterile barriers used  and Seldinger technique used Catheter size: 8.5 Fr PA cath was placed.Sheath introducer Swan type:thermodilution Procedure performed using ultrasound guided technique. Ultrasound Notes:anatomy identified, needle tip was noted to be adjacent to the nerve/plexus identified, no ultrasound evidence of intravascular and/or intraneural injection and image(s) printed for medical record Attempts: 1 Following insertion, line sutured, dressing applied and Biopatch. Post procedure assessment: blood return through all ports, free fluid flow and no air  Patient tolerated the procedure well with no immediate complications. Additional procedure comments: PA catheter:  Routine monitors. Timeout, sterile prep, drape, FBP R neck.  Supine position.  1% Lido local, finder and trocar RIJ 1st pass with US guidance.  Cordis placed over J wire. PA catheter in easily.  Sterile dressing applied.  Patient tolerated well, VSS.  Jenita Seashore, MD.

## 2019-08-27 NOTE — Anesthesia Procedure Notes (Signed)
Arterial Line Insertion Start/End10/11/2018 7:05 AM, 08/27/2019 7:25 AM Performed by: Albertha Ghee, MD, anesthesiologist  Preanesthetic checklist: patient identified, IV checked, site marked, risks and benefits discussed, surgical consent, monitors and equipment checked, pre-op evaluation, timeout performed and anesthesia consent Lidocaine 1% used for infiltration and patient sedated Left, radial was placed Catheter size: 20 G Hand hygiene performed  and maximum sterile barriers used  Allen's test indicative of satisfactory collateral circulation Attempts: 3 Procedure performed using ultrasound guided technique. Ultrasound Notes:anatomy identified, needle tip was noted to be adjacent to the nerve/plexus identified and no ultrasound evidence of intravascular and/or intraneural injection Following insertion, dressing applied and Biopatch. Post procedure assessment: normal  Patient tolerated the procedure well with no immediate complications.

## 2019-08-27 NOTE — Progress Notes (Signed)
[facility] BRIEF SICU PROGRESS NOTE  Day of Surgery  S/P Procedure(s) (LRB): BENTALL PROCEDURE with a 27 mm KONECT Resilia Aortic Valved Conduit and 30 mm x 30 cm Hemashield Platinum straight graft. (N/A) CORONARY ARTERY BYPASS GRAFTING (CABG) times 2 using right saphenous endoscopic vein harvesting to OM2 nad PDA. (N/A) TRANSESOPHAGEAL ECHOCARDIOGRAM (TEE) (N/A) INDOCYANINE GREEN FLUORESCENCE IMAGING (ICG) (N/A)   Sedated on vent AAI paced w/ stable hemodynamics on Epi, Neo and low dose milrinone O2 sats 100% Chest tube output trending down, receiving FFP for coagulopathy  Plan: Watch chest tube output and continue routine early postop  Rexene Alberts, MD 08/27/2019 6:42 PM

## 2019-08-27 NOTE — H&P (Signed)
History and Physical Interval Note:  08/27/2019 7:25 AM  Jeffrey Tucker  has presented today for surgery, with the diagnosis of TAA SEVERE AI.  The various methods of treatment have been discussed with the patient and family. After consideration of risks, benefits and other options for treatment, the patient has consented to  Procedure(s): BENTALL PROCEDURE (N/A) CORONARY ARTERY BYPASS GRAFTING (CABG) (N/A) TRANSESOPHAGEAL ECHOCARDIOGRAM (TEE) (N/A) INDOCYANINE GREEN FLUORESCENCE IMAGING (ICG) (N/A) as a surgical intervention.  The patient's history has been reviewed, patient examined, no change in status, stable for surgery.  I have reviewed the patient's chart and labs.  Questions were answered to the patient's satisfaction.     Wonda Olds, MD [facility] [phone]

## 2019-08-27 NOTE — Brief Op Note (Signed)
08/27/2019  1:09 PM  PATIENT:  Jeffrey Tucker  63 y.o. male  PRE-OPERATIVE DIAGNOSIS:  Thoraci Aortic Aneurysm SEVERE Aortic Insufficiency  POST-OPERATIVE DIAGNOSIS:  Thoraci Aortic Aneurysm SEVERE Aortic Insufficiency  PROCEDURES:   BENTALL PROCEDURE with a 27 mm KONECT Resilia Aortic Valved Conduit. (N/A) CORONARY ARTERY BYPASS GRAFTING   SVG->OM  SVG->PDA ENDOSCOPIC VEIN HARVESTING TRANSESOPHAGEAL ECHOCARDIOGRAM  INDOCYANINE GREEN FLUORESCENCE IMAGING (ICG)   SURGEON:  Wonda Olds, MD   PHYSICIAN ASSISTANT: Roddenberry  ANESTHESIA:   general  EBL:  Per anesthesia and perfusion records   BLOOD ADMINISTERED:none  DRAINS: Mediastinal tubes.   LOCAL MEDICATIONS USED:  NONE  SPECIMEN:  Source of Specimen:  Ascending aorta and aortic valve leaflets  DISPOSITION OF SPECIMEN:  PATHOLOGY  COUNTS:  YES  DICTATION: .Dragon Dictation  PLAN OF CARE: Admit to inpatient   PATIENT DISPOSITION:  ICU - extubated and stable.   Delay start of Pharmacological VTE agent (>24hrs) due to surgical blood loss or risk of bleeding: yes

## 2019-08-27 NOTE — Progress Notes (Signed)
  Echocardiogram Echocardiogram Transesophageal has been performed.  Darlina Sicilian M 08/27/2019, 8:46 AM

## 2019-08-27 NOTE — Transfer of Care (Signed)
Immediate Anesthesia Transfer of Care Note  Patient: Jeffrey Tucker  Procedure(s) Performed: BENTALL PROCEDURE with a 27 mm KONECT Resilia Aortic Valved Conduit and 30 mm x 30 cm Hemashield Platinum straight graft. (N/A Chest) CORONARY ARTERY BYPASS GRAFTING (CABG) times 2 using right saphenous endoscopic vein harvesting to OM2 nad PDA. (N/A Chest) TRANSESOPHAGEAL ECHOCARDIOGRAM (TEE) (N/A ) INDOCYANINE GREEN FLUORESCENCE IMAGING (ICG) (N/A )  Patient Location: SICU  Anesthesia Type:General  Level of Consciousness: Patient remains intubated per anesthesia plan  Airway & Oxygen Therapy: Patient remains intubated per anesthesia plan and Patient placed on Ventilator (see vital sign flow sheet for setting)  Post-op Assessment: Report given to RN and Post -op Vital signs reviewed and stable  Post vital signs: Reviewed and stable  Last Vitals:  Vitals Value Taken Time  BP    Temp    Pulse    Resp    SpO2      Last Pain:  Vitals:   08/27/19 0604  TempSrc: Oral  PainSc:       Patients Stated Pain Goal: 0 ([id number])  Complications: No apparent anesthesia complications

## 2019-08-27 NOTE — Anesthesia Procedure Notes (Signed)
Procedure Name: Intubation Date/Time: 08/27/2019 7:56 AM Performed by: Inda Coke, CRNA Pre-anesthesia Checklist: Patient identified, Emergency Drugs available, Suction available and Patient being monitored Patient Re-evaluated:Patient Re-evaluated prior to induction Oxygen Delivery Method: Circle System Utilized Preoxygenation: Pre-oxygenation with 100% oxygen Induction Type: IV induction Ventilation: Mask ventilation without difficulty Laryngoscope Size: Miller and 2 Grade View: Grade I Tube type: Oral Tube size: 8.0 mm Number of attempts: 1 Airway Equipment and Method: Stylet and Oral airway Placement Confirmation: ETT inserted through vocal cords under direct vision,  positive ETCO2 and breath sounds checked- equal and bilateral Secured at: 23 cm Tube secured with: Tape Dental Injury: Teeth and Oropharynx as per pre-operative assessment and Injury to lip  Comments: Inserted by Reece Agar, SRNA small blood noticed to upper lip after DL. Lubricant applied

## 2019-08-27 NOTE — Progress Notes (Signed)
Pt CT output from 1800-1845 was 120cc.  Dr Roxy Manns at bedside and aware.  Pt still receiving 2nd unit of FFP, still has PRBC and plasma to receive.  Will report off to evening shift.  Karsten Ro, RN

## 2019-08-27 NOTE — Op Note (Signed)
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, ascending aortic aneurysm, 2-vessel Coronary Artery Disease  Postoperative Diagnosis: Same  Procedure:   Biobentall procedure (27 mm KONECT Resilia bioprosthetic) Distal hemiarch reconstruction with 30 mm Dacron graft under hypothermic circulatory arrest (18 min) with 17 min antegrade cerebral perfusion); right axillary cannulation Coronary Artery Bypass Grafting x 2   Saphenous Vein Graft to Posterior Descending Coronary Artery, Saphenous Vein Graft to Obtuse Marginal Branch of Left Circumflex Coronary Artery; Endoscopic Vein Harvest from right Thigh and Lower Leg; completion graft surveillance with indocyanine green fluorescence imaging (SPY)  Surgeon: B. Murvin Natal, MD  Assistant: Enid Cutter MD  Anesthesia: get  Operative Findings:  depressed left ventricular systolic function; dilated and thickened LV  good quality saphenous vein conduit  good quality target vessels for grafting  BRIEF CLINICAL NOTE AND INDICATIONS FOR SURGERY  63 yo man with long history of known "heart murmur" underwent transthoracic echo a couple months ago as surveillance.  This demonstrated severe aortic insufficiency and a dilated left ventricle.  CT scanning demonstrated aortic root aneurysm and ascending aortic aneurysm.  The patient then underwent left heart catheterization which demonstrated two-vessel coronary artery disease.  He is taken the operating room today for replacement of the aortic root and ascending aorta and two-vessel bypass.   DETAILS OF THE OPERATIVE PROCEDURE  Preparation:  The patient is brought to the operating room on the above mentioned date and central monitoring was established by the anesthesia team including placement of Swan-Ganz catheter and radial arterial line. The patient is placed in the supine position on the  operating table.  Intravenous antibiotics are administered. General endotracheal anesthesia is induced uneventfully. A Foley catheter is placed.  Baseline transesophageal echocardiogram was performed.  Findings were notable for severe aortic insufficiency and depressed left ventricular function  The patient's chest, abdomen, both groins, and both lower extremities are prepared and draped in a sterile manner. A time out procedure is performed.   Surgical Approach:  An incision is made overlying the right deltopectoral groove.  Dissection was carried down to subcutaneous tissue.  The pectoralis major and minor muscles were divided.  The right axillary artery was then encountered and encircled.  5000 units of heparin were given intravenously.  A 10 mm dacryon graft was brought onto the field and sewn end-to-side to the right axillary artery with a running suture of 5-0 Prolene.  The graft was de-aired and attached to the arterial limb of the cardiopulmonary bypass instrument. Simultaneously, the greater saphenous vein is obtained from the patient's right leg using endoscopic vein harvest technique. The saphenous vein is notably good quality conduit. After removal of the saphenous vein, the small surgical incisions in the lower extremity are closed with absorbable suture. The entire pre-bypass portion of the operation was notable for stable hemodynamics.    Extracorporeal Cardiopulmonary Bypass and Myocardial Protection:  Median sternotomy incision was performed.  The pericardium is opened. The ascending aorta is severely dilated in appearance. The  right atrium is cannulated for cardiopulmonary bypass.  Full dose heparinization is given.  After adequate heparinization is verified, the patient was placed on cardiopulmonary bypass A retrograde cardioplegia cannula is placed through the right atrium into the coronary sinus.  A left ventricular vent was inserted through the right superior pulmonary vein.   The patient was cooled to a goal temperature of 25 C  Distal target vessels are selected for coronary artery  bypass grafting.   The aortic cross clamp is applied and cold blood cardioplegia is delivered initially in an retro-grade fashion.  At the same time the aorta was transected at the midportion.  This allowed delivery of antegrade cardioplegia  directly into the coronary ostia.  Iced saline slush is applied for topical hypothermia.  The initial cardioplegic arrest is rapid with early diastolic arrest.  Repeat doses of cardioplegia are administered intermittently throughout the entire cross clamp portion of the operation through the coronary ostia, through the coronary sinus catheter, and through subsequently placed vein grafts in order to maintain completely flat electrocardiogram.  Myocardial protection was felt to be excellent.   Coronary Artery Bypass Grafting:   The posterior descending branch of the right coronary artery was grafted using a reversed saphenous vein graft in an end-to-side fashion.  At the site of distal anastomosis the target vessel was good quality and measured approximately 1.5 mm in diameter. Anastomotic patency and runoff was confirmed with indocyanine green fluorescence imaging (SPY).  The obtuse marginal branch of the left circumflex coronary artery was grafted using a reversed saphenous vein graft in an end-to-side fashion.  At the site of distal anastomosis the target vessel was good quality and measured approximately 2 mm in diameter. Anastomotic patency and runoff was confirmed with indocyanine green fluorescence imaging (SPY).   Aortic root replacement: The aortic root is dissected down to the level of the aortic annulus.  The leaflets are removed.  Of note, the noncoronary cusp is diminutive and morphologically abnormal.  The left and right coronary buttons are created.  At this point the patient had been cooling for approximately 1 hour to the goal of 25 C.   Therefore, preparations were made for circulatory arrest and distal anastomosis.  Should be noted that CO2 was blown into the field.  The head is packed in ice and steroids are given.  The pump is turned off.  The remainder of the ascending aorta is transected near the takeoff of the great vessels.  The innominate artery and left carotid artery are both clamped allowing antegrade cerebral perfusion to begin approximately 1 minute after beginning circulatory arrest.  Cerebral saturations are monitored and did not vary significantly from baseline.  A 30 mm dacryon graft was brought onto the field and cut at a bevel.  This is sewn to the underside of the arch with a running suture of 3-0 Prolene.  When this is completed the dacryon graft is clamped after de-airing and blood flow to the body is reestablished.  The circulatory arrest time was 18 minutes with 17 minutes of antegrade cerebral perfusion.  Next circumferential sutures of 2-0 Ethibond were placed in an everting fashion at the level of the aortic valve annulus.  These were then brought through the sewing cuff of a 27 mm KONECT Resilia bio Bentall.  The sutures are then brought the down and tied with a core knot crimping device.  The valve seated well within the annulus.  Next the left main coronary button is anastomosed to the sinus portion of the graft with a running suture of 5-0 Prolene.  The anastomosis is tested by clamping the resilia graft and giving cold blood down the coronary ostia.  The right coronary button is attached to the skirt of the graft with a running suture of 5-0 Prolene and both buttons are tested.  Finally graft to graft anastomosis was done rhythm with a running suture of 5-0 Prolene.  Next, defects in the  graft are made for the 2 proximal coronary anastomoses.  Anastomosis was done with a running suture of 6-0 Prolene.  When this is completed, reanimation dose of warm blood is given retrograde.  De-airing procedures were performed and  the aortic cross-clamp was removed.  Cross-clamp time is 179 minutes.  All proximal and distal coronary anastomoses were inspected for hemostasis and appropriate graft orientation. Epicardial pacing wires are fixed to the right ventricular outflow tract and to the right atrial appendage. The patient is rewarmed to 37C temperature. The patient is weaned and disconnected from cardiopulmonary bypass.  The patient's rhythm at separation from bypass was sinus bradycardia.  The patient was weaned from cardiopulmonary bypass with moderate inotropic support. Total cardiopulmonary bypass time for the operation was 222 minutes.  Followup transesophageal echocardiogram performed after separation from bypass revealed  no significant changes from the preoperative exam.  The valve function was good.  The axillary artery graft and venous cannula were removed uneventfully. Protamine was administered to reverse the anticoagulation. The mediastinum and pleural space were inspected for hemostasis and irrigated with saline solution. The mediastinum and was drained using fluted chest tubes placed through separate stab incisions inferiorly.  The soft tissues anterior to the aorta were reapproximated loosely. The sternum is closed with double strength sternal wire. The soft tissues anterior to the sternum were closed in multiple layers and the skin is closed with a running subcuticular skin closure.    Disposition:  The patient tolerated the procedure well and is transported to the surgical intensive care in stable but critical condition. There are no intraoperative complications. All sponge instrument and needle counts are verified correct at completion of the operation.    Jayme Cloud, MD 08/27/2019 5:19 PM

## 2019-08-27 NOTE — Progress Notes (Signed)
Rapid wean initiated per protocol  

## 2019-08-27 NOTE — Anesthesia Procedure Notes (Signed)
Arterial Line Insertion Start/End10/11/2018 7:00 AM, 08/27/2019 8:05 AM Performed by: Inda Coke, CRNA, CRNA  Preanesthetic checklist: patient identified, IV checked, site marked, risks and benefits discussed, surgical consent, monitors and equipment checked, pre-op evaluation, timeout performed and anesthesia consent Lidocaine 1% used for infiltration and patient sedated Right, radial was placed Catheter size: 20 G Hand hygiene performed  and maximum sterile barriers used  Allen's test indicative of satisfactory collateral circulation Attempts: 1 Procedure performed without using ultrasound guided technique. Ultrasound Notes:anatomy identified Following insertion, dressing applied and Biopatch. Post procedure assessment: normal  Patient tolerated the procedure well with no immediate complications.

## 2019-08-28 ENCOUNTER — Encounter (HOSPITAL_COMMUNITY): Payer: Self-pay | Admitting: Cardiothoracic Surgery

## 2019-08-28 ENCOUNTER — Inpatient Hospital Stay (HOSPITAL_COMMUNITY): Payer: Self-pay

## 2019-08-28 LAB — BPAM FFP
Blood Product Expiration Date: 202010052359
Blood Product Expiration Date: 202010052359
Blood Product Expiration Date: 202010052359
Blood Product Expiration Date: 202010052359
ISSUE DATE / TIME: 202010011434
ISSUE DATE / TIME: 202010011434
ISSUE DATE / TIME: 202010011740
ISSUE DATE / TIME: 202010011829
Unit Type and Rh: 6200
Unit Type and Rh: 6200
Unit Type and Rh: 600
Unit Type and Rh: 6200

## 2019-08-28 LAB — POCT I-STAT 7, (LYTES, BLD GAS, ICA,H+H)
Acid-base deficit: 3 mmol/L — ABNORMAL HIGH (ref 0.0–2.0)
Acid-base deficit: 3 mmol/L — ABNORMAL HIGH (ref 0.0–2.0)
Acid-base deficit: 4 mmol/L — ABNORMAL HIGH (ref 0.0–2.0)
Acid-base deficit: 4 mmol/L — ABNORMAL HIGH (ref 0.0–2.0)
Bicarbonate: 21.4 mmol/L (ref 20.0–28.0)
Bicarbonate: 21.6 mmol/L (ref 20.0–28.0)
Bicarbonate: 21.9 mmol/L (ref 20.0–28.0)
Bicarbonate: 22.1 mmol/L (ref 20.0–28.0)
Calcium, Ion: 0.96 mmol/L — ABNORMAL LOW (ref 1.15–1.40)
Calcium, Ion: 1.05 mmol/L — ABNORMAL LOW (ref 1.15–1.40)
Calcium, Ion: 1.05 mmol/L — ABNORMAL LOW (ref 1.15–1.40)
Calcium, Ion: 1.07 mmol/L — ABNORMAL LOW (ref 1.15–1.40)
HCT: 20 % — ABNORMAL LOW (ref 39.0–52.0)
HCT: 20 % — ABNORMAL LOW (ref 39.0–52.0)
HCT: 22 % — ABNORMAL LOW (ref 39.0–52.0)
HCT: 23 % — ABNORMAL LOW (ref 39.0–52.0)
Hemoglobin: 6.8 g/dL — CL (ref 13.0–17.0)
Hemoglobin: 6.8 g/dL — CL (ref 13.0–17.0)
Hemoglobin: 7.5 g/dL — ABNORMAL LOW (ref 13.0–17.0)
Hemoglobin: 7.8 g/dL — ABNORMAL LOW (ref 13.0–17.0)
O2 Saturation: 96 %
O2 Saturation: 98 %
O2 Saturation: 98 %
O2 Saturation: 99 %
Patient temperature: 34.4
Patient temperature: 36.4
Patient temperature: 36.7
Patient temperature: 37.2
Potassium: 3 mmol/L — ABNORMAL LOW (ref 3.5–5.1)
Potassium: 3.8 mmol/L (ref 3.5–5.1)
Potassium: 3.8 mmol/L (ref 3.5–5.1)
Potassium: 4 mmol/L (ref 3.5–5.1)
Sodium: 143 mmol/L (ref 135–145)
Sodium: 143 mmol/L (ref 135–145)
Sodium: 144 mmol/L (ref 135–145)
Sodium: 145 mmol/L (ref 135–145)
TCO2: 23 mmol/L (ref 22–32)
TCO2: 23 mmol/L (ref 22–32)
TCO2: 23 mmol/L (ref 22–32)
TCO2: 23 mmol/L (ref 22–32)
pCO2 arterial: 34.8 mmHg (ref 32.0–48.0)
pCO2 arterial: 37.5 mmHg (ref 32.0–48.0)
pCO2 arterial: 38.3 mmHg (ref 32.0–48.0)
pCO2 arterial: 38.7 mmHg (ref 32.0–48.0)
pH, Arterial: 7.353 (ref 7.350–7.450)
pH, Arterial: 7.363 (ref 7.350–7.450)
pH, Arterial: 7.366 (ref 7.350–7.450)
pH, Arterial: 7.399 (ref 7.350–7.450)
pO2, Arterial: 104 mmHg (ref 83.0–108.0)
pO2, Arterial: 114 mmHg — ABNORMAL HIGH (ref 83.0–108.0)
pO2, Arterial: 130 mmHg — ABNORMAL HIGH (ref 83.0–108.0)
pO2, Arterial: 82 mmHg — ABNORMAL LOW (ref 83.0–108.0)

## 2019-08-28 LAB — PREPARE PLATELET PHERESIS
Unit division: 0
Unit division: 0

## 2019-08-28 LAB — PREPARE FRESH FROZEN PLASMA
Unit division: 0
Unit division: 0
Unit division: 0
Unit division: 0

## 2019-08-28 LAB — CBC
HCT: 22.3 % — ABNORMAL LOW (ref 39.0–52.0)
HCT: 22.5 % — ABNORMAL LOW (ref 39.0–52.0)
HCT: 27.3 % — ABNORMAL LOW (ref 39.0–52.0)
Hemoglobin: 7.7 g/dL — ABNORMAL LOW (ref 13.0–17.0)
Hemoglobin: 7.7 g/dL — ABNORMAL LOW (ref 13.0–17.0)
Hemoglobin: 9.2 g/dL — ABNORMAL LOW (ref 13.0–17.0)
MCH: 30.9 pg (ref 26.0–34.0)
MCH: 30.8 pg (ref 26.0–34.0)
MCH: 30.5 pg (ref 26.0–34.0)
MCHC: 34.5 g/dL (ref 30.0–36.0)
MCHC: 34.2 g/dL (ref 30.0–36.0)
MCHC: 33.7 g/dL (ref 30.0–36.0)
MCV: 89.6 fL (ref 80.0–100.0)
MCV: 90 fL (ref 80.0–100.0)
MCV: 90.4 fL (ref 80.0–100.0)
Platelets: 126 10*3/uL — ABNORMAL LOW (ref 150–400)
Platelets: 117 10*3/uL — ABNORMAL LOW (ref 150–400)
Platelets: 110 10*3/uL — ABNORMAL LOW (ref 150–400)
RBC: 2.49 MIL/uL — ABNORMAL LOW (ref 4.22–5.81)
RBC: 2.5 MIL/uL — ABNORMAL LOW (ref 4.22–5.81)
RBC: 3.02 MIL/uL — ABNORMAL LOW (ref 4.22–5.81)
RDW: 14.6 % (ref 11.5–15.5)
RDW: 15.1 % (ref 11.5–15.5)
RDW: 14.9 % (ref 11.5–15.5)
WBC: 9.8 10*3/uL (ref 4.0–10.5)
WBC: 10.7 10*3/uL — ABNORMAL HIGH (ref 4.0–10.5)
WBC: 12.5 10*3/uL — ABNORMAL HIGH (ref 4.0–10.5)
nRBC: 0 % (ref 0.0–0.2)
nRBC: 0 % (ref 0.0–0.2)
nRBC: 0 % (ref 0.0–0.2)

## 2019-08-28 LAB — COOXEMETRY PANEL
Carboxyhemoglobin: 1.1 % (ref 0.5–1.5)
Methemoglobin: 0.6 % (ref 0.0–1.5)
O2 Saturation: 64.4 %
Total hemoglobin: 7.4 g/dL — ABNORMAL LOW (ref 12.0–16.0)

## 2019-08-28 LAB — GLUCOSE, CAPILLARY
Glucose-Capillary: 102 mg/dL — ABNORMAL HIGH (ref 70–99)
Glucose-Capillary: 107 mg/dL — ABNORMAL HIGH (ref 70–99)
Glucose-Capillary: 108 mg/dL — ABNORMAL HIGH (ref 70–99)
Glucose-Capillary: 109 mg/dL — ABNORMAL HIGH (ref 70–99)
Glucose-Capillary: 109 mg/dL — ABNORMAL HIGH (ref 70–99)
Glucose-Capillary: 117 mg/dL — ABNORMAL HIGH (ref 70–99)
Glucose-Capillary: 122 mg/dL — ABNORMAL HIGH (ref 70–99)
Glucose-Capillary: 88 mg/dL (ref 70–99)
Glucose-Capillary: 99 mg/dL (ref 70–99)

## 2019-08-28 LAB — BPAM PLATELET PHERESIS
Blood Product Expiration Date: 202010022359
Blood Product Expiration Date: 202010022359
ISSUE DATE / TIME: 202010011434
ISSUE DATE / TIME: 202010011914
Unit Type and Rh: 6200
Unit Type and Rh: 8400

## 2019-08-28 LAB — BASIC METABOLIC PANEL
Anion gap: 9 (ref 5–15)
Anion gap: 8 (ref 5–15)
BUN: 13 mg/dL (ref 8–23)
BUN: 15 mg/dL (ref 8–23)
CO2: 23 mmol/L (ref 22–32)
CO2: 22 mmol/L (ref 22–32)
Calcium: 7 mg/dL — ABNORMAL LOW (ref 8.9–10.3)
Calcium: 7.1 mg/dL — ABNORMAL LOW (ref 8.9–10.3)
Chloride: 110 mmol/L (ref 98–111)
Chloride: 109 mmol/L (ref 98–111)
Creatinine, Ser: 0.88 mg/dL (ref 0.61–1.24)
Creatinine, Ser: 0.95 mg/dL (ref 0.61–1.24)
GFR calc Af Amer: >60 mL/min (ref >60)
GFR calc Af Amer: >60 mL/min (ref >60)
GFR calc non Af Amer: >60 mL/min (ref >60)
GFR calc non Af Amer: >60 mL/min (ref >60)
Glucose, Bld: 116 mg/dL — ABNORMAL HIGH (ref 70–99)
Glucose, Bld: 155 mg/dL — ABNORMAL HIGH (ref 70–99)
Potassium: 4 mmol/L (ref 3.5–5.1)
Potassium: 4.9 mmol/L (ref 3.5–5.1)
Sodium: 142 mmol/L (ref 135–145)
Sodium: 139 mmol/L (ref 135–145)

## 2019-08-28 LAB — PREPARE RBC (CROSSMATCH)

## 2019-08-28 LAB — MAGNESIUM
Magnesium: 2.7 mg/dL — ABNORMAL HIGH (ref 1.7–2.4)
Magnesium: 2.5 mg/dL — ABNORMAL HIGH (ref 1.7–2.4)

## 2019-08-28 LAB — BPAM CRYOPRECIPITATE
Blood Product Expiration Date: 202010012042
ISSUE DATE / TIME: 202010011512
Unit Type and Rh: 6200

## 2019-08-28 LAB — PREPARE CRYOPRECIPITATE: Unit division: 0

## 2019-08-28 LAB — SURGICAL PATHOLOGY

## 2019-08-28 MED ORDER — SODIUM CHLORIDE 0.9% IV SOLUTION: Freq: Once | INTRAVENOUS | Status: AC

## 2019-08-28 MED FILL — Potassium Chloride Inj 2 mEq/ML: INTRAVENOUS | Qty: 40 | Status: AC

## 2019-08-28 MED FILL — Lidocaine HCl Local Preservative Free (PF) Inj 2%: INTRAMUSCULAR | Qty: 15 | Status: AC

## 2019-08-28 MED FILL — Heparin Sodium (Porcine) Inj 1000 Unit/ML: INTRAMUSCULAR | Qty: 30 | Status: AC

## 2019-08-28 NOTE — Discharge Summary (Signed)
Physician Discharge Summary  Patient ID: ALROY BERNHARD MRN: [id number] DOB/AGE: 63-Jul-1957 63 y.o.  Admit date: 08/27/2019 Discharge date: 09/03/2019  Admission Diagnoses: 1. Multivessel coronary artery disease 2. Ascending aortic aneurysm 3. Severeortic valve insufficiency  Discharge Diagnoses:  1. S/p Bio Bentall and CABG x 2 2. Expceted blood loss anemia 3. History of hypertension 4. History of benign prostatic hyperplasia 5. History of tobacco abuse 6. History of asthma 7. History of hypertriglyceridemia 8. History of hypothyroidism  Discharged Condition: Stable  HPI:  This is a 63 year old gentleman was referred for ascending aortic aneurysm and bicuspid aortic valve with severe valve regurgitation.  In his usual state of health until approximately 10 years ago when during a routine physical when a physician noticed a heart murmur.  Followed somewhat regularly since then for aortic valve regurgitation.  Addition over this time, he has developed ascending aortic aneurysm.  Measuring over 5 cm at the root level.  He has no known family history of aneurysmal disease.  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  His chest pain shortness of breath PND or orthopnea.  He had left heart cath on 08/21/19 confirming 2-vessel coronary artery disease. He was seen in consultation by Dr. Orvan Seen and after review of his clinical data, coronary artery bypass grafting and Bentall procedure were recommended and Mr. Leve decided to proceed with surgery.   Hospital Course:  Mr. Cost was admitted for same-day surgery on 08/27/19 and taken to the OR where CABG x 2 and Bentall procedure was performed without complication. Following the procedure, he separated from cardiopulmonary bypass without difficulty. He was transferred to the CVICU where he remained hemodynamically stable.  He was extubated routinely. He was transfused with 2 units PRBC's for expected acute  blood loss anemia on POD-1.   He was weaned off Milrinone drip. He was started on Lopressor. He was volume overloaded and diuresed accordingly. He was weaned off the Insulin drip. His pre op HGA1C was 5.5. He was felt surgically stable for transfer from the ICU to 4E for further convalescence on 10/04. He has been tolerating a diet. He was given a laxative a few days ago, as he had not had a bowel movement, and later moved his bowels. Epicardial pacing wires were removed on 10/06. He is ambulating on room air;however, he did desat on 10/06 and was put on 1 liter of oxygen via [place].  Follow up PA/LAT CXR showed bilateral pleural effusions, L>R. He underwent a left thoracentesis on 10/07 and 600 ml of dark fluid was removed. About 1 1/2 hours afterward, the nurse paged me that he was having a lot of left back and shoulder pain.  Patient's vital signs were stable. A stat portable chest x ray was done and showed no pneumothorax and basilar density on the left which may be related to re-expansion or edema. He was given Morphine for the pain. As discussed with Dr. Orvan Seen, he felt the pain may be related to re expansion of the left lung. Because of continued pain, a CT of the chest was done. Results showed small b/l pleural effusions and atelectasis, stable cardiomegaly, and complex fluid surrounding the aortic root and proximal arch likely reflecting small amount hemorrhage, which is likely postsurgical. Patient was encouraged to take breaths and he may have some pain but should improve with time. He did feel better the am of 10/08 and he slept well.   I have had several  long discussions as patient anxious about having severe left shoulder and back pain. All of his questions have been answered. Dr Orvan Seen also spoke with the patient the afternoon of 10/08. Chest tube sutures will be removed in the office after discharge.   As discussed with Dr. Orvan Seen, patient is felt surgically stable for discharge today.  Consults:  None  Significant Diagnostic Studies:   RIGHT/LEFT HEART CATH AND CORONARY ANGIOGRAPHY  Conclusion   Prox Cx to Mid Cx lesion is 80% stenosed.  Mid Cx lesion is 80% stenosed.  Dist RCA lesion is 70% stenosed.  RPDA lesion is 70% stenosed.  Hemodynamic findings consistent with moderate pulmonary hypertension.    Surgeon Notes    08/27/2019 5:53 PM Op Note signed by Wonda Olds, MD  Indications  Aortic valve insufficiency, etiology of cardiac valve disease unspecified [I35.1 (ICD-10-CM)]  Procedural Details  Technical Details Cardiac Catheterization Procedure Note  Name: WASEEM DELUCCHI MRN: [id number] DOB: 1956-11-24  Procedure: Left Heart Cath, Selective Coronary Angiography, LV angiography, right heart catheterization  Indication:   Mr. Blazejewski is a very pleasant 63 year old gentleman with  Moderate to severe aortic valve regurgitation, Severely dilated ascending aorta Echo 2020:aortic root 4.9 cm and of the ascending aorta 4.4 cm, aortic arch 3.7 cm. bicuspid aortic valve.  Recent chest pain symptoms concerning for unstable angina Anxiety Presented for cardiac catheterization given his severely dilated aorta, severe aortic valve regurgitation, cardiomyopathy, anginal symptoms Preop catheterization   Procedural details: The right groin was prepped, draped, and anesthetized with 1% lidocaine. Using modified Seldinger technique, a 5 French sheath was introduced into the right femoral artery.  6 French sheath introduced into right femoral vein , standard Judkins catheters (JL 5, JR 4 and pigtail catheter, Swan-Ganz catheter ) were used for coronary angiography and left ventriculography. Catheter exchanges were performed over a guidewire.  Swan-Ganz catheter advanced through right femoral vein to obtain right heart pressures . there were no immediate procedural complications. The patient was transferred to the post catheterization recovery area for further  monitoring.  Moderate sedation: 1. Sedation used:2 mg versed, 25ug  fentanyl,  2. Time of administration:     9:30 am       Time patient left for recovery 10:15 AM Total sedation time 45 minutes 3. I was Face to Face with the patient during this time: (code: [phone])   Procedural Findings:  Hemodynamics:     AO 177/44 , mean 90    LV 175/12,  end 32   Coronary angiography:  Coronary dominance: Right  Left mainstem:   Large vessel that bifurcates into the LAD and left circumflex, no significant disease noted  Left anterior descending (LAD):   Large vessel that extends to the apical region, diagonal branch 2 of moderate size, no significant disease noted  Left circumflex (LCx):  Large vessel with OM branch 2, mid circumflex after OM1 takeoff with sequential 80% lesions  Right coronary artery (RCA):  Right dominant vessel with PL and PDA, distal vessel with 70% stenosis, proximal PDA 70% stenosis  Left ventriculography: Left ventricular systolic function is mildly depressed with mild global hypokinesis, LVEF is estimated at 45 %, there is no significant mitral regurgitation , no significant aortic valve stenosis, severe aortic valve regurgitation noted  Right heart catheterization numbers Right atrium mean of 7,  RV 45/4 end 9 PA 37/19 mean 25 Wedge mean of 19  Final Conclusions:   Severely dilated aortic root, ascending aorta Severe aortic valve regurgitation Severe two-vessel  disease distal RCA, left circumflex Elevated right heart pressures   Recommendations:  Case discussed with Dr. Orvan Seen, CT surgery team He is scheduled for bentall procedure , will also need CABG at the same time Stay on aspirin, losartan Start Crestor 10 mg daily goal LDL 60 or less   Ida Rogue 08/21/2019, 10:17 AM  Estimated blood loss <50 mL.   During this procedure medications were administered to achieve and maintain moderate conscious sedation while the patient's heart rate, blood  pressure, and oxygen saturation were continuously monitored and I was present face-to-face 100% of this time.  Medications (Filter: Administrations occurring from 08/21/19 0848 to 08/21/19 1012) (important)  Continuous medications are totaled by the amount administered until 08/21/19 1012.  Medication Rate/Dose/Volume Action  Date Time   midazolam (VERSED) injection (mg) 1 mg Given 08/21/19 0911   Total dose as of 08/21/19 1012 1 mg Given 0959   2 mg        fentaNYL (SUBLIMAZE) injection (mcg) 25 mcg Given 08/21/19 0912   Total dose as of 08/21/19 1012        25 mcg        Heparin (Porcine) in NaCl 2000-0.9 UNIT/L-% SOLN (mL) 500 mL Given 08/21/19 0936   Total dose as of 08/21/19 1012        500 mL        iohexol (OMNIPAQUE) 300 MG/ML solution (mL) 70 mL Given 08/21/19 0944   Total dose as of 08/21/19 1012        70 mL        nitroGLYCERIN (NITROSTAT) SL tablet (mg) 0.4 mg Given 08/21/19 0959   Total dose as of 08/21/19 1012        0.4 mg        Sedation Time  Sedation Time Physician-1: 30 minutes 2 seconds  Contrast  Medication Name Total Dose  iohexol (OMNIPAQUE) 300 MG/ML solution 70 mL    Radiation/Fluoro  Fluoro time: 5.5 (min) DAP: 23.2 (Gycm2) Cumulative Air Kerma: 245 (mGy)  Coronary Findings  Diagnostic Dominance: Right Left Circumflex  Prox Cx to Mid Cx lesion 80% stenosed  Prox Cx to Mid Cx lesion is 80% stenosed.  Mid Cx lesion 80% stenosed  Mid Cx lesion is 80% stenosed.  Right Coronary Artery  Dist RCA lesion 70% stenosed  Dist RCA lesion is 70% stenosed.  Right Posterior Descending Artery  RPDA lesion 70% stenosed  RPDA lesion is 70% stenosed.  Intervention  No interventions have been documented. Right Heart  Right Heart Pressures Hemodynamic findings consistent with moderate pulmonary hypertension.  Wall Motion  Resting               Coronary Diagrams  Diagnostic Dominance: Right     ECHOCARDIOGRAM REPORT       Patient Name:    JAEVIAN SERINO Date of Exam: 06/25/2019 Medical Rec #:  [id number]      Height:       66.0 in Accession #:    [id number]     Weight:       154.0 lb Date of Birth:  July 28, 1956      BSA:          1.79 m Patient Age:    63 years       BP:           140/68 mmHg Patient Gender: M              HR:  50 bpm. Exam Location:  [facility]    Procedure: 2D Echo, Cardiac Doppler and Color Doppler  Indications:    I35.1 Nonrheumatic aortic (valve) insufficiency   History:        Patient has prior history of Echocardiogram examinations, most                 recent 12/14/2016. Aortic Valve Disease Risk Factors: Former                 Smoker, Dyslipidemia and Hypertension. Dilated aorta.   Sonographer:    Pilar Jarvis RDMS, RVT, RDCS Referring Phys: [place] Comments: Patient denies any dyspnea and/or fatigue IMPRESSIONS    1. The left ventricle has low normal systolic function, with an ejection fraction of 50-55%. The cavity size was moderately dilated. There is mildly increased left ventricular wall thickness. Left ventricular diastolic Doppler parameters are consistent  with pseudonormalization.  2. The right ventricle has normal systolic function. The cavity was normal. There is no increase in right ventricular wall thickness.Unable to estimate RVSP.  3. The aortic valve was not well visualized though appears to be bicuspid. Aortic valve regurgitation is moderate to severe.  4. There is dilatation of the aortic root 4.9 cm and of the ascending aorta 4.4 cm, aortic arch 3.7 cm.  FINDINGS  Left Ventricle: The left ventricle has low normal systolic function, with an ejection fraction of 50-55%. The cavity size was moderately dilated. There is mildly increased left ventricular wall thickness. Left ventricular diastolic Doppler parameters  are consistent with pseudonormalization.  Right Ventricle: The right ventricle has normal systolic function. The cavity  was normal. There is no increase in right ventricular wall thickness.  Left Atrium: Left atrial size was normal in size.  Right Atrium: Right atrial size was normal in size. Right atrial pressure is estimated at 10 mmHg.  Interatrial Septum: No atrial level shunt detected by color flow Doppler.  Pericardium: There is no evidence of pericardial effusion.  Mitral Valve: The mitral valve is normal in structure. Mitral valve regurgitation is not visualized by color flow Doppler.  Tricuspid Valve: The tricuspid valve is normal in structure. Tricuspid valve regurgitation was not visualized by color flow Doppler.  Aortic Valve: The aortic valve was not well visualized Aortic valve regurgitation is moderate to severe by color flow Doppler.  Pulmonic Valve: The pulmonic valve was grossly normal. Pulmonic valve regurgitation is not visualized by color flow Doppler.  Aorta: The aorta is abnormal in size and structure. There is dilatation of the aortic root and of the ascending aorta.  Venous: The inferior vena cava measures 2.40 cm, is normal in size with greater than 50% respiratory variability.    +--------------+--------++  LEFT VENTRICLE                 +----------------+----------++ +--------------+--------++       Diastology                     PLAX 2D                        +----------------+----------++ +--------------+--------++       LV e' lateral:   10.30 cm/s    LVIDd:         7.30 cm         +----------------+----------++ +--------------+--------++       LV E/e' lateral: 10.0  LVIDs:         4.90 cm         +----------------+----------++ +--------------+--------++       LV e' medial:    7.07 cm/s     LV PW:         1.10 cm         +----------------+----------++ +--------------+--------++       LV E/e' medial:  14.6          LV IVS:        0.90 cm         +----------------+----------++ +--------------+--------++  LVOT diam:     2.40 cm     +--------------+--------++  LV SV:         168 ml     +--------------+--------++  LV SV Index:   92.60      +--------------+--------++  LVOT Area:     4.52 cm   +--------------+--------++                            +--------------+--------++   +------------------+---------++  LV Volumes (MOD)               +------------------+---------++  LV area d, A2C:    58.40 cm   +------------------+---------++  LV area d, A4C:    59.70 cm   +------------------+---------++  LV area s, A2C:    40.20 cm   +------------------+---------++  LV area s, A4C:    36.70 cm   +------------------+---------++  LV major d, A2C:   11.30 cm    +------------------+---------++  LV major d, A4C:   10.20 cm    +------------------+---------++  LV major s, A2C:   9.96 cm     +------------------+---------++  LV major s, A4C:   8.88 cm     +------------------+---------++  LV vol d, MOD A2C: 253.0 ml    +------------------+---------++  LV vol d, MOD A4C: 285.0 ml    +------------------+---------++  LV vol s, MOD A2C: 136.0 ml    +------------------+---------++  LV vol s, MOD A4C: 125.0 ml    +------------------+---------++  LV SV MOD A2C:     117.0 ml    +------------------+---------++  LV SV MOD A4C:     285.0 ml    +------------------+---------++  LV SV MOD BP:      142.1 ml    +------------------+---------++  +---------------+----------++  RIGHT VENTRICLE              +---------------+----------++  RV Basal diam:  3.30 cm      +---------------+----------++  RV S prime:     11.70 cm/s   +---------------+----------++  TAPSE (M-mode): 2.6 cm       +---------------+----------++  +---------------+-------++-----------++  LEFT ATRIUM              Index         +---------------+-------++-----------++  LA diam:        3.20 cm  1.79 cm/m    +---------------+-------++-----------++  LA Vol (A2C):   79.2 ml  44.26 ml/m   +---------------+-------++-----------++  LA Vol (A4C):   87.5  ml  48.89 ml/m   +---------------+-------++-----------++  LA Biplane Vol: 84.1 ml  46.99 ml/m   +---------------+-------++-----------++ +------------+---------++-----------++  RIGHT ATRIUM            Index         +------------+---------++-----------++  RA Area:     17.70 cm                +------------+---------++-----------++  RA Volume:   48.10 ml   26.88 ml/m   +------------+---------++-----------++  +------------------+------------++ +--------------+--------++  AORTIC VALVE                       PULMONIC VALVE            +------------------+------------++ +--------------+--------++  AV Area (Vmax):    3.47 cm        PV Vmax:       0.75 m/s   +------------------+------------++ +--------------+--------++  AV Area (Vmean):   3.60 cm        PV Peak grad:  2.2 mmHg   +------------------+------------++ +--------------+--------++  AV Area (VTI):     3.50 cm       +------------------+------------++  AV Vmax:           247.67 cm/s    +------------------+------------++  AV Vmean:          145.667 cm/s   +------------------+------------++  AV VTI:            0.626 m        +------------------+------------++  AV Peak Grad:      24.5 mmHg      +------------------+------------++  AV Mean Grad:      10.7 mmHg      +------------------+------------++  LVOT Vmax:         190.00 cm/s    +------------------+------------++  LVOT Vmean:        116.000 cm/s   +------------------+------------++  LVOT VTI:          0.484 m        +------------------+------------++  LVOT/AV VTI ratio: 0.77           +------------------+------------++  AR PHT:            333 msec       +------------------+------------++   +-------------+-------++  AORTA                   +-------------+-------++  Ao Root diam: 4.90 cm   +-------------+-------++  Ao Asc diam:  4.40 cm   +-------------+-------++  Ao Arch diam: 3.7 cm    +-------------+-------++  +--------------+----------++  MITRAL VALVE                  +--------------+-------+ +--------------+----------++   SHUNTS                   MV Area (PHT): 3.42 cm      +--------------+-------+ +--------------+----------++   Systemic VTI:  0.48 m    MV PHT:        64.38 msec    +--------------+-------+ +--------------+----------++   Systemic Diam: 2.40 cm   MV Decel Time: 222 msec      +--------------+-------+ +--------------+----------++ +--------------+-----------++  MV E velocity: 103.00 cm/s   +--------------+-----------++  MV A velocity: 83.90 cm/s    +--------------+-----------++  MV E/A ratio:  1.23          +--------------+-----------++  +---------+-------+  IVC                +---------+-------+  IVC diam: 2.40 cm  +---------+-------+    Ida Rogue MD Electronically signed by Ida Rogue MD Signature Date/Time: 06/25/2019/6:47:16 PM   Treatments:   CARDIOTHORACIC SURGERY OPERATIVE NOTE  Date of Procedure:    08/27/2019  Preoperative Diagnosis:      Aortic root aneurysm with severe aortic insufficiency, ascending aortic aneurysm, 2-vessel Coronary Artery Disease  Postoperative Diagnosis:    Same  Procedure:  Biobentall procedure (27 mm KONECT Resilia bioprosthetic) Distal hemiarch reconstruction with 30 mm Dacron graft under hypothermic circulatory arrest (18 min) with 17 min antegrade cerebral perfusion); right axillary cannulation Coronary Artery Bypass Grafting x 2              Saphenous Vein Graft to Posterior Descending Coronary Artery, Saphenous Vein Graft to Obtuse Marginal Branch of Left Circumflex Coronary Artery; Endoscopic Vein Harvest from right Thigh and Lower Leg; completion graft surveillance with indocyanine green fluorescence imaging (SPY)  Surgeon:        B. Murvin Natal, MD  Assistant:       Enid Cutter MD  Anesthesia:    get  Operative Findings: ? depressed left ventricular systolic function; dilated and thickened LV ? good quality saphenous vein conduit ? good  quality target vessels for grafting  BRIEF CLINICAL NOTE AND INDICATIONS FOR SURGERY  63 yo man with long history of known "heart murmur" underwent transthoracic echo a couple months ago as surveillance.  This demonstrated severe aortic insufficiency and a dilated left ventricle.  CT scanning demonstrated aortic root aneurysm and ascending aortic aneurysm.  The patient then underwent left heart catheterization which demonstrated two-vessel coronary artery disease.  He is taken the operating room today for replacement of the aortic root and ascending aorta and two-vessel bypass.  Discharge Exam: Blood pressure 116/79, pulse 75, temperature 98.4 F (36.9 C), temperature source Oral, resp. rate 14, height 5\' 6"  (1.676 m), weight 74.3 kg, SpO2 98 %.   Cardiovascular: RRR Pulmonary: Mostly clear Abdomen: Soft, non tender, bowel sounds present. Extremities:Mild bilateral lower extremity edema. Wounds: Clean and dry.  No erythema or signs of infection.  Disposition: Stable and discharged home with wife.  Discharge Instructions    Amb Referral to Cardiac Rehabilitation   Complete by: As directed    Diagnosis:  CABG Valve Replacement     Valve: Aortic   CABG X ___: 2   After initial evaluation and assessments completed: Virtual Based Care may be provided alone or in conjunction with Phase 2 Cardiac Rehab based on patient barriers.: Yes     Allergies as of 09/03/2019      Reactions   Tetracycline Hcl Other (See Comments)   Joint pain (and stiffened them, also)   Amlodipine Swelling   Lower extremities and body became swollen; can tolerate 5 mg doses, however      Medication List    STOP taking these medications   losartan 100 MG tablet Commonly known as: COZAAR   nitroGLYCERIN 0.4 MG SL tablet Commonly known as: NITROSTAT   rosuvastatin 10 MG tablet Commonly known as: CRESTOR     TAKE these medications   acetaminophen 325 MG tablet Commonly known as: TYLENOL Take 2 tablets  (650 mg total) by mouth every 6 (six) hours as needed for mild pain.   albuterol 108 (90 Base) MCG/ACT inhaler Commonly known as: VENTOLIN HFA INHALE 2 PUFFS BY MOUTH THREE TIMES DAILY AS NEEDED FOR ASTHMA FLARE What changed: See the new instructions.   aspirin 325 MG EC tablet Take 1 tablet (325 mg total) by mouth daily. What changed:   medication strength  how much to take   atorvastatin 20 MG tablet Commonly known as: LIPITOR Take 1 tablet (20 mg total) by mouth daily at 6 PM.   betamethasone (augmented) 0.05 % lotion Commonly known as: DIPROLENE Apply topically 2 (two) times daily as needed. What changed:   how much to take  reasons to take  this   cetirizine 10 MG tablet Commonly known as: ZYRTEC Take 10 mg by mouth daily.   ferrous sulfate 325 (65 FE) MG tablet Take 1 tablet (325 mg total) by mouth daily with breakfast. For one month then stop. If develop constipation, may stop sooner   Fluticasone-Salmeterol 100-50 MCG/DOSE Aepb Commonly known as: ADVAIR Inhale 1 puff into the lungs 2 (two) times daily.   furosemide 40 MG tablet Commonly known as: Lasix Take 1 tablet (40 mg total) by mouth daily. For one week then stop.   ketoconazole 2 % shampoo Commonly known as: NIZORAL Apply 1 application topically once a week.   levothyroxine 125 MCG tablet Commonly known as: SYNTHROID Take 1 tablet by mouth once daily What changed: when to take this   metoprolol tartrate 25 MG tablet Commonly known as: LOPRESSOR Take 0.5 tablets (12.5 mg total) by mouth 2 (two) times daily.   potassium chloride SA 20 MEQ tablet Commonly known as: KLOR-CON Take 1 tablet (20 mEq total) by mouth daily. For one week then stop.   traMADol 50 MG tablet Commonly known as: ULTRAM Take 1 tablet (50 mg total) by mouth every 4 (four) hours as needed for moderate pain.      Follow-up Information    Rise Mu, PA-C. Go on 09/15/2019.   Specialties: Physician Assistant, Cardiology,  Radiology Why: @9 :30am for hospital follow up.  Contact information: [place] [address] [id number]        Wonda Olds, MD. Go on 09/11/2019.   Specialty: Cardiothoracic Surgery Why: Appointment time is at 1:30 pm Contact information: [address]  [id number] [phone]          The patient has been discharged on:   1.Beta Blocker:  Yes [  x ]                              No   [   ]                              If No, reason:  2.Ace Inhibitor/ARB: Yes [   ]                                     No  [  x  ]                                     If No, reason:Labile BP  3.Statin:   Yes [ x  ]                  No  [   ]                  If No, reason:  4.Ecasa:  Yes  [ x  ]                  No   [   ]                  If No, reason:    Signed: Nani Skillern, PA-C 09/03/2019, 10:11 AM

## 2019-08-28 NOTE — Anesthesia Postprocedure Evaluation (Signed)
Anesthesia Post Note  Patient: Jeffrey Tucker  Procedure(s) Performed: BENTALL PROCEDURE with a 27 mm KONECT Resilia Aortic Valved Conduit and 30 mm x 30 cm Hemashield Platinum straight graft. (N/A Chest) CORONARY ARTERY BYPASS GRAFTING (CABG) times 2 using right saphenous endoscopic vein harvesting to OM2 nad PDA. (N/A Chest) TRANSESOPHAGEAL ECHOCARDIOGRAM (TEE) (N/A ) INDOCYANINE GREEN FLUORESCENCE IMAGING (ICG) (N/A )     Patient location during evaluation: SICU Anesthesia Type: General Level of consciousness: awake and alert, patient cooperative and oriented Pain management: pain level controlled Vital Signs Assessment: post-procedure vital signs reviewed and stable Respiratory status: spontaneous breathing, nonlabored ventilation, respiratory function stable and patient connected to nasal cannula oxygen Cardiovascular status: blood pressure returned to baseline and stable Postop Assessment: no apparent nausea or vomiting, adequate PO intake and able to ambulate Anesthetic complications: no    Last Vitals:  Vitals:   08/28/19 1600 08/28/19 1700  BP: 120/76 132/86  Pulse: 60 63  Resp: (!) 21 20  Temp: 37.1 C   SpO2: 95% 96%    Last Pain:  Vitals:   08/28/19 1600  TempSrc: Oral  PainSc: 0-No pain                 Jasma Seevers,E. Verenice [place]

## 2019-08-28 NOTE — Discharge Instructions (Signed)

## 2019-08-28 NOTE — Plan of Care (Signed)
  Problem: Education: Goal: Will demonstrate proper wound care and an understanding of methods to prevent future damage Outcome: Progressing Goal: Knowledge of disease or condition will improve Outcome: Progressing Goal: Knowledge of the prescribed therapeutic regimen will improve Outcome: Progressing   Problem: Activity: Goal: Risk for activity intolerance will decrease Outcome: Progressing   Problem: Cardiac: Goal: Will achieve and/or maintain hemodynamic stability Outcome: Progressing   Problem: Clinical Measurements: Goal: Postoperative complications will be avoided or minimized Outcome: Progressing   Problem: Respiratory: Goal: Respiratory status will improve Outcome: Progressing   Problem: Skin Integrity: Goal: Wound healing without signs and symptoms of infection Outcome: Progressing Goal: Risk for impaired skin integrity will decrease Outcome: Progressing   

## 2019-08-28 NOTE — Progress Notes (Signed)
EVENING ROUNDS NOTE :     [address]       [place] [address]             [phone]                 1 Day Post-Op Procedure(s) (LRB): BENTALL PROCEDURE with a 27 mm KONECT Resilia Aortic Valved Conduit and 30 mm x 30 cm Hemashield Platinum straight graft. (N/A) CORONARY ARTERY BYPASS GRAFTING (CABG) times 2 using right saphenous endoscopic vein harvesting to OM2 nad PDA. (N/A) TRANSESOPHAGEAL ECHOCARDIOGRAM (TEE) (N/A) INDOCYANINE GREEN FLUORESCENCE IMAGING (ICG) (N/A)  Total Length of Stay:  LOS: 1 day  BP 132/86   Pulse 63   Temp 98.7 F (37.1 C) (Oral)   Resp 20   Ht 5\' 6"  (1.676 m)   Wt 76.4 kg   SpO2 96%   BMI 27.19 kg/m   .Intake/Output      10/01 0701 - 10/02 0700 10/02 0701 - 10/03 0700   P.O.  375   I.V. (mL/kg) 4980.3 (65.2) 195.9 (2.6)   Blood 2862 353   IV Piggyback 1242.9    Total Intake(mL/kg) 9085.1 (118.9) 923.9 (12.1)   Urine (mL/kg/hr) 3065 (1.7) 500 (0.6)   Emesis/NG output 0    Blood 1200    Chest Tube 900 440   Total Output 5165 940   Net +3920.1 -16.1          . sodium chloride Stopped (08/28/19 1157)  . sodium chloride    . sodium chloride 20 mL/hr at 08/27/19 1605  . cefUROXime (ZINACEF)  IV 1.5 g (08/28/19 1655)  . dexmedetomidine (PRECEDEX) IV infusion Stopped (08/28/19 0440)  . epinephrine Stopped (08/28/19 1654)  . insulin Stopped (08/28/19 1006)  . lactated ringers    . lactated ringers    . lactated ringers 20 mL/hr at 08/28/19 0900  . milrinone 0.125 mcg/kg/min (08/28/19 1700)  . nitroGLYCERIN Stopped (08/27/19 1826)  . phenylephrine (NEO-SYNEPHRINE) Adult infusion Stopped (08/27/19 2126)     Lab Results  Component Value Date   WBC 12.5 (H) 08/28/2019   HGB 9.2 (L) 08/28/2019   HCT 27.3 (L) 08/28/2019   PLT 110 (L) 08/28/2019   GLUCOSE 155 (H) 08/28/2019   CHOL 184 04/02/2019   TRIG 207.0 (H) 04/02/2019   HDL 29.60 (L) 04/02/2019   LDLDIRECT 107.0 04/02/2019   LDLCALC 103 (H) 05/26/2012   ALT 12  08/25/2019   AST 17 08/25/2019   NA 139 08/28/2019   K 4.9 08/28/2019   CL 109 08/28/2019   CREATININE 0.95 08/28/2019   BUN 15 08/28/2019   CO2 22 08/28/2019   TSH 2.12 05/01/2019   PSA 0.33 04/02/2019   INR 1.6 (H) 08/27/2019   HGBA1C 5.5 08/25/2019    hct 27 On milrinone Walking in unit   [place] [phone] Office [phone] 08/28/2019 5:46 PM

## 2019-08-28 NOTE — Procedures (Signed)
Extubation Procedure Note  Patient Details:   Name: [name] DOB: 1955-12-30 MRN: [id number]   Airway Documentation:    Vent end date: 08/28/19 Vent end time: 0016   Evaluation  O2 sats: stable throughout Complications: No apparent complications Patient did tolerate procedure well. Bilateral Breath Sounds: Clear, Diminished   Yes   Pt extubated per protocol. NIF: -40 VC: .51ml IS: 183ml best of three attempts.   positive cuff leak. No stridor noted, Clear BBS. Pt placed on 4L [place] per protocol, Sp02 is 100%   Clance Boll 08/28/2019, 12:27 AM

## 2019-08-28 NOTE — Progress Notes (Signed)
1 Day Post-Op Procedure(s) (LRB): BENTALL PROCEDURE with a 27 mm KONECT Resilia Aortic Valved Conduit and 30 mm x 30 cm Hemashield Platinum straight graft. (N/A) CORONARY ARTERY BYPASS GRAFTING (CABG) times 2 using right saphenous endoscopic vein harvesting to OM2 nad PDA. (N/A) TRANSESOPHAGEAL ECHOCARDIOGRAM (TEE) (N/A) INDOCYANINE GREEN FLUORESCENCE IMAGING (ICG) (N/A) Subjective: No complaints; resting comfortably  Objective: Vital signs in last 24 hours: Temp:  [93.9 F (34.4 C)-99.5 F (37.5 C)] 99 F (37.2 C) (10/02 0800) Pulse Rate:  [64-88] 64 (10/02 0800) Cardiac Rhythm: Normal sinus rhythm (10/02 0800) Resp:  [0-20] 15 (10/02 0800) BP: (99-157)/(70-84) 111/76 (10/02 0800) SpO2:  [98 %-100 %] 98 % (10/02 0800) Arterial Line BP: (103-168)/(59-112) 140/63 (10/02 0800) FiO2 (%):  [40 %-50 %] 40 % (10/01 2340) Weight:  [76.4 kg] 76.4 kg (10/02 0600)  Hemodynamic parameters for last 24 hours: PAP: (19-39)/(7-16) 39/14 CO:  [4.4 L/min-5.7 L/min] 4.4 L/min CI:  [2.4 L/min/m2-3.2 L/min/m2] 2.4 L/min/m2  Intake/Output from previous day: 10/01 0701 - 10/02 0700 In: 9085.1 [I.V.:4980.3; CW:[id number]; IV Piggyback:1242.9] Out: [id number]; Blood:1200; Chest Tube:880] Intake/Output this shift: Total I/O In: -  Out: 80 [Urine:50; Chest Tube:30]  General appearance: alert and cooperative Neurologic: intact Heart: regular rate and rhythm, S1, S2 normal, no murmur, click, rub or gallop Lungs: clear to auscultation bilaterally Extremities: extremities normal, atraumatic, no cyanosis or edema Wound: dressed, dry  Lab Results: Recent Labs    08/28/19 0307 08/28/19 0321 08/28/19 0748  WBC 9.8  --  10.7*  HGB 7.7* 6.8* 7.7*  HCT 22.3* 20.0* 22.5*  PLT 126*  --  117*   BMET:  Recent Labs    08/25/19 0902  08/27/19 1407  08/27/19 2246  08/28/19 0307 08/28/19 0321  NA 139   < > 143   < >  --    < > 142 143  K 4.5   < > 3.4*   < >  --    < > 4.0 4.0  CL 109  --  104   --   --   --  110  --   CO2 21*  --   --   --   --   --  23  --   GLUCOSE 101*   < > 122*  --   --   --  116*  --   BUN 17  --  13  --   --   --  13  --   CREATININE 0.75  --  0.60*  --  0.96  --  0.88  --   CALCIUM 8.7*  --   --   --   --   --  7.0*  --    < > = values in this interval not displayed.    PT/INR:  Recent Labs    08/27/19 1618  LABPROT 18.8*  INR 1.6*   ABG    Component Value Date/Time   PHART 7.366 08/28/2019 0321   HCO3 21.9 08/28/2019 0321   TCO2 23 08/28/2019 0321   ACIDBASEDEF 3.0 (H) 08/28/2019 0321   O2SAT 96.0 08/28/2019 0321   CBG (last 3)  Recent Labs    08/28/19 0205 08/28/19 0320 08/28/19 0438  GLUCAP 109* 122* 108*    Assessment/Plan: S/P Procedure(s) (LRB): BENTALL PROCEDURE with a 27 mm KONECT Resilia Aortic Valved Conduit and 30 mm x 30 cm Hemashield Platinum straight graft. (N/A) CORONARY ARTERY BYPASS GRAFTING (CABG) times 2 using right saphenous endoscopic vein harvesting to OM2  nad PDA. (N/A) TRANSESOPHAGEAL ECHOCARDIOGRAM (TEE) (N/A) INDOCYANINE GREEN FLUORESCENCE IMAGING (ICG) (N/A) Mobilize transfuse one unit PRBC for Hct 22; oob to chair; d/c pa catheter and arterial line   LOS: 1 day    Wonda Olds 08/28/2019

## 2019-08-29 ENCOUNTER — Inpatient Hospital Stay (HOSPITAL_COMMUNITY): Payer: Self-pay

## 2019-08-29 LAB — BASIC METABOLIC PANEL
Anion gap: 4 — ABNORMAL LOW (ref 5–15)
BUN: 20 mg/dL (ref 8–23)
CO2: 24 mmol/L (ref 22–32)
Calcium: 7 mg/dL — ABNORMAL LOW (ref 8.9–10.3)
Chloride: 108 mmol/L (ref 98–111)
Creatinine, Ser: 0.92 mg/dL (ref 0.61–1.24)
GFR calc Af Amer: >60 mL/min (ref >60)
GFR calc non Af Amer: >60 mL/min (ref >60)
Glucose, Bld: 125 mg/dL — ABNORMAL HIGH (ref 70–99)
Potassium: 4.7 mmol/L (ref 3.5–5.1)
Sodium: 136 mmol/L (ref 135–145)

## 2019-08-29 LAB — CBC
HCT: 25 % — ABNORMAL LOW (ref 39.0–52.0)
Hemoglobin: 8.5 g/dL — ABNORMAL LOW (ref 13.0–17.0)
MCH: 30.9 pg (ref 26.0–34.0)
MCHC: 34 g/dL (ref 30.0–36.0)
MCV: 90.9 fL (ref 80.0–100.0)
Platelets: 120 10*3/uL — ABNORMAL LOW (ref 150–400)
RBC: 2.75 MIL/uL — ABNORMAL LOW (ref 4.22–5.81)
RDW: 15.8 % — ABNORMAL HIGH (ref 11.5–15.5)
WBC: 13.2 10*3/uL — ABNORMAL HIGH (ref 4.0–10.5)
nRBC: 0 % (ref 0.0–0.2)

## 2019-08-29 MED ORDER — MOMETASONE FURO-FORMOTEROL FUM 100-5 MCG/ACT IN AERO
2.0000 | INHALATION_SPRAY | Freq: Two times a day (BID) | RESPIRATORY_TRACT | Status: DC
  Administered 2019-08-29 – 2019-09-03 (×11): 2 via RESPIRATORY_TRACT
  Filled 2019-08-29: qty 8.8

## 2019-08-29 MED ORDER — ORAL CARE MOUTH RINSE
15.0000 mL | Freq: Two times a day (BID) | OROMUCOSAL | Status: DC
  Administered 2019-08-29 – 2019-09-02 (×9): 15 mL via OROMUCOSAL

## 2019-08-29 MED ORDER — FUROSEMIDE 10 MG/ML IJ SOLN
20.0000 mg | Freq: Once | INTRAMUSCULAR | Status: AC
  Administered 2019-08-29: 10:00:00 20 mg via INTRAVENOUS
  Filled 2019-08-29: qty 2

## 2019-08-29 NOTE — Progress Notes (Signed)
Patient ID: Jeffrey Tucker, male   DOB: 06/20/1956, 63 y.o.   MRN: [id number] EVENING ROUNDS NOTE :     [address]       [place] [address]             [phone]                 2 Days Post-Op Procedure(s) (LRB): BENTALL PROCEDURE with a 27 mm KONECT Resilia Aortic Valved Conduit and 30 mm x 30 cm Hemashield Platinum straight graft. (N/A) CORONARY ARTERY BYPASS GRAFTING (CABG) times 2 using right saphenous endoscopic vein harvesting to OM2 nad PDA. (N/A) TRANSESOPHAGEAL ECHOCARDIOGRAM (TEE) (N/A) INDOCYANINE GREEN FLUORESCENCE IMAGING (ICG) (N/A)  Total Length of Stay:  LOS: 2 days  BP 101/68   Pulse 62   Temp 98.1 F (36.7 C) (Oral)   Resp 19   Ht 5\' 6"  (1.676 m)   Wt 77.8 kg   SpO2 93%   BMI 27.68 kg/m   .Intake/Output      10/02 0701 - 10/03 0700 10/03 0701 - 10/04 0700   P.O. 555    I.V. (mL/kg) 911.6 (11.7) 16 (0.2)   Blood 353    IV Piggyback 100    Total Intake(mL/kg) 1919.6 (24.7) 16 (0.2)   Urine (mL/kg/hr) 1010 (0.5) 475 (0.6)   Emesis/NG output     Blood     Chest Tube 770 150   Total Output 1780 625   Net +139.6 -609          . sodium chloride Stopped (08/29/19 0023)  . sodium chloride    . sodium chloride 20 mL/hr at 08/27/19 1605  . dexmedetomidine (PRECEDEX) IV infusion Stopped (08/28/19 0440)  . epinephrine Stopped (08/28/19 1654)  . insulin Stopped (08/28/19 1006)  . lactated ringers    . lactated ringers    . lactated ringers 20 mL/hr at 08/28/19 0900  . milrinone 0.125 mcg/kg/min (08/29/19 1300)  . nitroGLYCERIN Stopped (08/27/19 1826)  . phenylephrine (NEO-SYNEPHRINE) Adult infusion Stopped (08/27/19 2126)     Lab Results  Component Value Date   WBC 13.2 (H) 08/29/2019   HGB 8.5 (L) 08/29/2019   HCT 25.0 (L) 08/29/2019   PLT 120 (L) 08/29/2019   GLUCOSE 125 (H) 08/29/2019   CHOL 184 04/02/2019   TRIG 207.0 (H) 04/02/2019   HDL 29.60 (L) 04/02/2019   LDLDIRECT 107.0 04/02/2019   LDLCALC 103 (H) 05/26/2012   ALT 12 08/25/2019   AST 17 08/25/2019   NA 136 08/29/2019   K 4.7 08/29/2019   CL 108 08/29/2019   CREATININE 0.92 08/29/2019   BUN 20 08/29/2019   CO2 24 08/29/2019   TSH 2.12 05/01/2019   PSA 0.33 04/02/2019   INR 1.6 (H) 08/27/2019   HGBA1C 5.5 08/25/2019   Stable day Foley  Out  Today not voided yet, but removed 45 min ago   [name] MD  Beeper [phone] Office [phone] 08/29/2019 5:53 PM

## 2019-08-29 NOTE — Progress Notes (Signed)
Patient ID: Jeffrey Tucker, male   DOB: [birth year], 63 y.o.   MRN: [id number] TCTS DAILY ICU PROGRESS NOTE                   [address]            [place] [address]          [phone]   2 Days Post-Op Procedure(s) (LRB): BENTALL PROCEDURE with a 27 mm KONECT Resilia Aortic Valved Conduit and 30 mm x 30 cm Hemashield Platinum straight graft. (N/A) CORONARY ARTERY BYPASS GRAFTING (CABG) times 2 using right saphenous endoscopic vein harvesting to OM2 nad PDA. (N/A) TRANSESOPHAGEAL ECHOCARDIOGRAM (TEE) (N/A) INDOCYANINE GREEN FLUORESCENCE IMAGING (ICG) (N/A)  Total Length of Stay:  LOS: 2 days   Subjective: Patient alert awake sitting in chair this morning, walked in the unit this morning  Objective: Vital signs in last 24 hours: Temp:  [98.2 F (36.8 C)-99.2 F (37.3 C)] 99.2 F (37.3 C) (10/03 0722) Pulse Rate:  [55-69] 68 (10/03 0700) Cardiac Rhythm: Normal sinus rhythm (10/02 2000) Resp:  [13-22] 21 (10/03 0700) BP: (95-143)/(66-86) 115/74 (10/03 0700) SpO2:  [91 %-99 %] 95 % (10/03 0700) Arterial Line BP: (107-152)/(59-68) 152/67 (10/02 1150) Weight:  [77.8 kg] 77.8 kg (10/03 0700)  Filed Weights   08/27/19 0556 08/28/19 0600 08/29/19 0700  Weight: 71.2 kg 76.4 kg 77.8 kg    Weight change: 1.4 kg   Hemodynamic parameters for last 24 hours: PAP: (31-47)/(13-17) 47/17 CO:  [4.4 L/min] 4.4 L/min CI:  [2.4 L/min/m2] 2.4 L/min/m2  Intake/Output from previous day: 10/02 0701 - 10/03 0700 In: 1919.6 [P.O.:555; I.V.:911.6; Blood:353; IV Piggyback:100] Out: 1750 [Urine:980; Chest Tube:770]  Intake/Output this shift: No intake/output data recorded.  Current Meds: Scheduled Meds: . sodium chloride   Intravenous Once  . sodium chloride   Intravenous Once  . sodium chloride   Intravenous Once  . acetaminophen  1,000 mg Oral Q6H   Or  . acetaminophen (TYLENOL) oral liquid 160 mg/5 mL  1,000 mg Per Tube Q6H  . aspirin EC  325 mg Oral Daily   Or  .  aspirin  324 mg Per Tube Daily  . bisacodyl  10 mg Oral Daily   Or  . bisacodyl  10 mg Rectal Daily  . Chlorhexidine Gluconate Cloth  6 each Topical Daily  . Chlorhexidine Gluconate Cloth  6 each Topical Daily  . docusate sodium  200 mg Oral Daily  . levothyroxine  125 mcg Oral QAC breakfast  . mouth rinse  15 mL Mouth Rinse BID  . metoprolol tartrate  12.5 mg Oral BID   Or  . metoprolol tartrate  12.5 mg Per Tube BID  . pantoprazole  40 mg Oral Daily  . sodium chloride flush  10-40 mL Intracatheter Q12H  . sodium chloride flush  3 mL Intravenous Q12H   Continuous Infusions: . sodium chloride Stopped (08/28/19 1157)  . sodium chloride    . sodium chloride 20 mL/hr at 08/27/19 1605  . cefUROXime (ZINACEF)  IV 1.5 g (08/29/19 0504)  . dexmedetomidine (PRECEDEX) IV infusion Stopped (08/28/19 0440)  . epinephrine Stopped (08/28/19 1654)  . insulin Stopped (08/28/19 1006)  . lactated ringers    . lactated ringers    . lactated ringers 20 mL/hr at 08/28/19 0900  . milrinone 0.125 mcg/kg/min (08/28/19 2212)  . nitroGLYCERIN Stopped (08/27/19 1826)  . phenylephrine (NEO-SYNEPHRINE) Adult infusion Stopped (08/27/19 2126)   PRN Meds:.sodium chloride, influenza vac  split quadrivalent PF, lactated ringers, metoprolol tartrate, midazolam, morphine injection, ondansetron (ZOFRAN) IV, oxyCODONE, sodium chloride flush, sodium chloride flush, traMADol  General appearance: alert Neurologic: intact Heart: regular rate and rhythm, S1, S2 normal, no murmur, click, rub or gallop Lungs: diminished breath sounds bibasilar Abdomen: soft, non-tender; bowel sounds normal; no masses,  no organomegaly Extremities: extremities normal, atraumatic, no cyanosis or edema and Homans sign is negative, no sign of DVT Wound: Sternum stable  Lab Results: CBC: Recent Labs    08/28/19 1626 08/29/19 0500  WBC 12.5* 13.2*  HGB 9.2* 8.5*  HCT 27.3* 25.0*  PLT 110* 120*   BMET:  Recent Labs    08/28/19  1626 08/29/19 0500  NA 139 136  K 4.9 4.7  CL 109 108  CO2 22 24  GLUCOSE 155* 125*  BUN 15 20  CREATININE 0.95 0.92  CALCIUM 7.1* 7.0*    CMET: Lab Results  Component Value Date   WBC 13.2 (H) 08/29/2019   HGB 8.5 (L) 08/29/2019   HCT 25.0 (L) 08/29/2019   PLT 120 (L) 08/29/2019   GLUCOSE 125 (H) 08/29/2019   CHOL 184 04/02/2019   TRIG 207.0 (H) 04/02/2019   HDL 29.60 (L) 04/02/2019   LDLDIRECT 107.0 04/02/2019   LDLCALC 103 (H) 05/26/2012   ALT 12 08/25/2019   AST 17 08/25/2019   NA 136 08/29/2019   K 4.7 08/29/2019   CL 108 08/29/2019   CREATININE 0.92 08/29/2019   BUN 20 08/29/2019   CO2 24 08/29/2019   TSH 2.12 05/01/2019   PSA 0.33 04/02/2019   INR 1.6 (H) 08/27/2019   HGBA1C 5.5 08/25/2019      PT/INR:  Recent Labs    08/27/19 1618  LABPROT 18.8*  INR 1.6*   Radiology: Dg Chest Port 1 View  Result Date: 08/29/2019 CLINICAL DATA:  Chest tube placement. EXAM: PORTABLE CHEST 1 VIEW COMPARISON:  08/28/2019 FINDINGS: Right jugular central venous sheath. Removal of the Swan-Ganz catheter. Stable left basilar pleuroparenchymal disease. Mild bilateral interstitial thickening. Stable enlarged cardiac silhouette. Prior median sternotomy and aortic valve repair. No acute osseous abnormality. IMPRESSION: 1. Interval removal of the Swan-Ganz catheter. Right jugular central venous sheath in satisfactory position. 2. Persistent left basilar pleuroparenchymal disease likely reflecting a small pleural effusion and atelectasis. Electronically Signed   By: Kathreen Devoid   On: 08/29/2019 07:11     Assessment/Plan: S/P Procedure(s) (LRB): BENTALL PROCEDURE with a 27 mm KONECT Resilia Aortic Valved Conduit and 30 mm x 30 cm Hemashield Platinum straight graft. (N/A) CORONARY ARTERY BYPASS GRAFTING (CABG) times 2 using right saphenous endoscopic vein harvesting to OM2 nad PDA. (N/A) TRANSESOPHAGEAL ECHOCARDIOGRAM (TEE) (N/A) INDOCYANINE GREEN FLUORESCENCE IMAGING (ICG) (N/A)  Mobilize Diuresis d/c tubes/lines On low-dose milrinone DC Foley Expected Acute  Blood - loss Anemia- continue to monitor    [name] 08/29/2019 7:52 AM

## 2019-08-30 ENCOUNTER — Inpatient Hospital Stay (HOSPITAL_COMMUNITY): Payer: Self-pay

## 2019-08-30 LAB — CBC
HCT: 23.6 % — ABNORMAL LOW (ref 39.0–52.0)
Hemoglobin: 7.9 g/dL — ABNORMAL LOW (ref 13.0–17.0)
MCH: 30.6 pg (ref 26.0–34.0)
MCHC: 33.5 g/dL (ref 30.0–36.0)
MCV: 91.5 fL (ref 80.0–100.0)
Platelets: 126 10*3/uL — ABNORMAL LOW (ref 150–400)
RBC: 2.58 MIL/uL — ABNORMAL LOW (ref 4.22–5.81)
RDW: 15.3 % (ref 11.5–15.5)
WBC: 13.2 10*3/uL — ABNORMAL HIGH (ref 4.0–10.5)
nRBC: 0 % (ref 0.0–0.2)

## 2019-08-30 LAB — BASIC METABOLIC PANEL
Anion gap: 9 (ref 5–15)
BUN: 20 mg/dL (ref 8–23)
CO2: 26 mmol/L (ref 22–32)
Calcium: 7.3 mg/dL — ABNORMAL LOW (ref 8.9–10.3)
Chloride: 101 mmol/L (ref 98–111)
Creatinine, Ser: 0.94 mg/dL (ref 0.61–1.24)
GFR calc Af Amer: >60 mL/min (ref >60)
GFR calc non Af Amer: >60 mL/min (ref >60)
Glucose, Bld: 113 mg/dL — ABNORMAL HIGH (ref 70–99)
Potassium: 4.5 mmol/L (ref 3.5–5.1)
Sodium: 136 mmol/L (ref 135–145)

## 2019-08-30 MED ORDER — SODIUM CHLORIDE 0.9% FLUSH: 3.0000 mL | INTRAVENOUS | Status: DC | PRN

## 2019-08-30 MED ORDER — PANTOPRAZOLE SODIUM 40 MG PO TBEC
40.0000 mg | DELAYED_RELEASE_TABLET | Freq: Every day | ORAL | Status: DC
  Administered 2019-08-31 – 2019-09-03 (×4): 40 mg via ORAL
  Filled 2019-08-30 (×5): qty 1

## 2019-08-30 MED ORDER — SODIUM CHLORIDE 0.9% FLUSH
3.0000 mL | Freq: Two times a day (BID) | INTRAVENOUS | Status: DC
  Administered 2019-08-30 – 2019-09-03 (×7): 3 mL via INTRAVENOUS

## 2019-08-30 MED ORDER — MOVING RIGHT ALONG BOOK
Freq: Once | Status: AC
  Administered 2019-08-30: 18:00:00
  Filled 2019-08-30: qty 1

## 2019-08-30 MED ORDER — BISACODYL 10 MG RE SUPP
10.0000 mg | Freq: Every day | RECTAL | Status: DC | PRN
  Administered 2019-08-31 – 2019-09-01 (×2): 10 mg via RECTAL
  Filled 2019-08-30 (×2): qty 1

## 2019-08-30 MED ORDER — ONDANSETRON HCL 4 MG/2ML IJ SOLN: 4.0000 mg | Freq: Four times a day (QID) | INTRAMUSCULAR | Status: DC | PRN

## 2019-08-30 MED ORDER — FOLIC ACID 1 MG PO TABS
1.0000 mg | ORAL_TABLET | Freq: Every day | ORAL | Status: DC
  Administered 2019-08-30 – 2019-09-03 (×5): 1 mg via ORAL
  Filled 2019-08-30 (×5): qty 1

## 2019-08-30 MED ORDER — ONDANSETRON HCL 4 MG PO TABS: 4.0000 mg | ORAL_TABLET | Freq: Four times a day (QID) | ORAL | Status: DC | PRN

## 2019-08-30 MED ORDER — ASPIRIN EC 325 MG PO TBEC
325.0000 mg | DELAYED_RELEASE_TABLET | Freq: Every day | ORAL | Status: DC
  Administered 2019-08-31 – 2019-09-03 (×4): 325 mg via ORAL
  Filled 2019-08-30 (×4): qty 1

## 2019-08-30 MED ORDER — SODIUM CHLORIDE 0.9 % IV SOLN: 250.0000 mL | INTRAVENOUS | Status: DC | PRN

## 2019-08-30 MED ORDER — BISACODYL 5 MG PO TBEC: 10.0000 mg | DELAYED_RELEASE_TABLET | Freq: Every day | ORAL | Status: DC | PRN

## 2019-08-30 MED ORDER — GUAIFENESIN ER 600 MG PO TB12: 600.0000 mg | ORAL_TABLET | Freq: Two times a day (BID) | ORAL | Status: DC | PRN

## 2019-08-30 MED ORDER — OXYCODONE HCL 5 MG PO TABS
5.0000 mg | ORAL_TABLET | ORAL | Status: DC | PRN
  Administered 2019-08-30 (×2): 5 mg via ORAL
  Administered 2019-08-31: 10 mg via ORAL
  Administered 2019-08-31 (×2): 5 mg via ORAL
  Administered 2019-09-02 (×2): 10 mg via ORAL
  Filled 2019-08-30: qty 1
  Filled 2019-08-30: qty 2
  Filled 2019-08-30: qty 1
  Filled 2019-08-30 (×2): qty 2
  Filled 2019-08-30 (×2): qty 1

## 2019-08-30 MED ORDER — CHLORHEXIDINE GLUCONATE CLOTH 2 % EX PADS
6.0000 | MEDICATED_PAD | Freq: Every day | CUTANEOUS | Status: DC
  Administered 2019-08-30: 6 via TOPICAL

## 2019-08-30 MED ORDER — ACETAMINOPHEN 325 MG PO TABS: 650.0000 mg | ORAL_TABLET | Freq: Four times a day (QID) | ORAL | Status: DC | PRN

## 2019-08-30 MED ORDER — METOPROLOL TARTRATE 12.5 MG HALF TABLET
12.5000 mg | ORAL_TABLET | Freq: Two times a day (BID) | ORAL | Status: DC
  Administered 2019-08-30 – 2019-09-03 (×8): 12.5 mg via ORAL
  Filled 2019-08-30 (×8): qty 1

## 2019-08-30 MED ORDER — TRAMADOL HCL 50 MG PO TABS
50.0000 mg | ORAL_TABLET | ORAL | Status: DC | PRN
  Administered 2019-08-31 – 2019-09-02 (×4): 100 mg via ORAL
  Filled 2019-08-30 (×4): qty 2

## 2019-08-30 MED ORDER — ATORVASTATIN CALCIUM 10 MG PO TABS
20.0000 mg | ORAL_TABLET | Freq: Every day | ORAL | Status: DC
  Administered 2019-08-30 – 2019-09-02 (×4): 20 mg via ORAL
  Filled 2019-08-30 (×4): qty 2

## 2019-08-30 NOTE — Progress Notes (Signed)
Patient ID: Jeffrey Tucker, male   DOB: [birth year], 63 y.o.   MRN: [id number] EVENING ROUNDS NOTE :     [address]       [place] [address]             [phone]                 3 Days Post-Op Procedure(s) (LRB): BENTALL PROCEDURE with a 27 mm KONECT Resilia Aortic Valved Conduit and 30 mm x 30 cm Hemashield Platinum straight graft. (N/A) CORONARY ARTERY BYPASS GRAFTING (CABG) times 2 using right saphenous endoscopic vein harvesting to OM2 nad PDA. (N/A) TRANSESOPHAGEAL ECHOCARDIOGRAM (TEE) (N/A) INDOCYANINE GREEN FLUORESCENCE IMAGING (ICG) (N/A)  Total Length of Stay:  LOS: 3 days  BP 125/74   Pulse 68   Temp 98.1 F (36.7 C) (Oral)   Resp (!) 23   Ht 5\' 6"  (1.676 m)   Wt 78.1 kg   SpO2 (!) 68%   BMI 27.79 kg/m   .Intake/Output      10/04 0701 - 10/05 0700   P.O. 470   I.V. (mL/kg)    IV Piggyback    Total Intake(mL/kg) 470 (6)   Urine (mL/kg/hr) 500 (0.5)   Stool 0   Chest Tube    Total Output 500   Net -30       Urine Occurrence 1 x   Stool Occurrence 1 x     . sodium chloride       Lab Results  Component Value Date   WBC 13.2 (H) 08/30/2019   HGB 7.9 (L) 08/30/2019   HCT 23.6 (L) 08/30/2019   PLT 126 (L) 08/30/2019   GLUCOSE 113 (H) 08/30/2019   CHOL 184 04/02/2019   TRIG 207.0 (H) 04/02/2019   HDL 29.60 (L) 04/02/2019   LDLDIRECT 107.0 04/02/2019   LDLCALC 103 (H) 05/26/2012   ALT 12 08/25/2019   AST 17 08/25/2019   NA 136 08/30/2019   K 4.5 08/30/2019   CL 101 08/30/2019   CREATININE 0.94 08/30/2019   BUN 20 08/30/2019   CO2 26 08/30/2019   TSH 2.12 05/01/2019   PSA 0.33 04/02/2019   INR 1.6 (H) 08/27/2019   HGBA1C 5.5 08/25/2019    Stable Waiting for step down bed  [name] MD  Beeper [phone] Office [phone] 08/30/2019 7:12 PM

## 2019-08-30 NOTE — Progress Notes (Signed)
Patient ID: Jeffrey Tucker, male   DOB: November 29, 1955, 63 y.o.   MRN: [id number] TCTS DAILY ICU PROGRESS NOTE                   [address]            [place] [address]          [phone]   3 Days Post-Op Procedure(s) (LRB): BENTALL PROCEDURE with a 27 mm KONECT Resilia Aortic Valved Conduit and 30 mm x 30 cm Hemashield Platinum straight graft. (N/A) CORONARY ARTERY BYPASS GRAFTING (CABG) times 2 using right saphenous endoscopic vein harvesting to OM2 nad PDA. (N/A) TRANSESOPHAGEAL ECHOCARDIOGRAM (TEE) (N/A) INDOCYANINE GREEN FLUORESCENCE IMAGING (ICG) (N/A)  Total Length of Stay:  LOS: 3 days   Subjective: Patient walked around the unit 4 times this morning  Objective: Vital signs in last 24 hours: Temp:  [97.4 F (36.3 C)-99 F (37.2 C)] 97.4 F (36.3 C) (10/04 0746) Pulse Rate:  [58-76] 63 (10/04 0722) Cardiac Rhythm: Normal sinus rhythm (10/04 0300) Resp:  [0-21] 12 (10/04 0722) BP: (97-134)/(63-81) 121/71 (10/04 0722) SpO2:  [87 %-99 %] 96 % (10/04 0722) Weight:  [78.1 kg] 78.1 kg (10/04 0500)  Filed Weights   08/28/19 0600 08/29/19 0700 08/30/19 0500  Weight: 76.4 kg 77.8 kg 78.1 kg    Weight change: 0.3 kg   Hemodynamic parameters for last 24 hours:    Intake/Output from previous day: 10/03 0701 - 10/04 0700 In: 494.1 [P.O.:120; I.V.:374.1] Out: 1625 [Urine:1375; Chest Tube:250]  Intake/Output this shift: No intake/output data recorded.  Current Meds: Scheduled Meds: . sodium chloride   Intravenous Once  . sodium chloride   Intravenous Once  . sodium chloride   Intravenous Once  . acetaminophen  1,000 mg Oral Q6H   Or  . acetaminophen (TYLENOL) oral liquid 160 mg/5 mL  1,000 mg Per Tube Q6H  . aspirin EC  325 mg Oral Daily   Or  . aspirin  324 mg Per Tube Daily  . bisacodyl  10 mg Oral Daily   Or  . bisacodyl  10 mg Rectal Daily  . Chlorhexidine Gluconate Cloth  6 each Topical Daily  . Chlorhexidine Gluconate Cloth  6 each  Topical Daily  . docusate sodium  200 mg Oral Daily  . levothyroxine  125 mcg Oral QAC breakfast  . mouth rinse  15 mL Mouth Rinse BID  . metoprolol tartrate  12.5 mg Oral BID   Or  . metoprolol tartrate  12.5 mg Per Tube BID  . mometasone-formoterol  2 puff Inhalation BID  . pantoprazole  40 mg Oral Daily  . sodium chloride flush  10-40 mL Intracatheter Q12H  . sodium chloride flush  3 mL Intravenous Q12H   Continuous Infusions: . sodium chloride Stopped (08/29/19 0023)  . sodium chloride    . sodium chloride 20 mL/hr at 08/27/19 1605  . dexmedetomidine (PRECEDEX) IV infusion Stopped (08/28/19 0440)  . epinephrine Stopped (08/28/19 1654)  . insulin Stopped (08/28/19 1006)  . lactated ringers    . lactated ringers    . lactated ringers Stopped (08/30/19 0458)  . nitroGLYCERIN Stopped (08/27/19 1826)  . phenylephrine (NEO-SYNEPHRINE) Adult infusion Stopped (08/27/19 2126)   PRN Meds:.sodium chloride, influenza vac split quadrivalent PF, lactated ringers, metoprolol tartrate, midazolam, morphine injection, ondansetron (ZOFRAN) IV, oxyCODONE, sodium chloride flush, sodium chloride flush, traMADol  General appearance: alert, cooperative and no distress Neurologic: intact Heart: regular rate and rhythm, S1, S2 normal,  no murmur, click, rub or gallop Lungs: diminished breath sounds bibasilar Abdomen: soft, non-tender; bowel sounds normal; no masses,  no organomegaly Extremities: extremities normal, atraumatic, no cyanosis or edema and Homans sign is negative, no sign of DVT Wound: Sternum intact  Lab Results: CBC: Recent Labs    08/29/19 0500 08/30/19 0305  WBC 13.2* 13.2*  HGB 8.5* 7.9*  HCT 25.0* 23.6*  PLT 120* 126*   BMET:  Recent Labs    08/29/19 0500 08/30/19 0305  NA 136 136  K 4.7 4.5  CL 108 101  CO2 24 26  GLUCOSE 125* 113*  BUN 20 20  CREATININE 0.92 0.94  CALCIUM 7.0* 7.3*    CMET: Lab Results  Component Value Date   WBC 13.2 (H) 08/30/2019   HGB  7.9 (L) 08/30/2019   HCT 23.6 (L) 08/30/2019   PLT 126 (L) 08/30/2019   GLUCOSE 113 (H) 08/30/2019   CHOL 184 04/02/2019   TRIG 207.0 (H) 04/02/2019   HDL 29.60 (L) 04/02/2019   LDLDIRECT 107.0 04/02/2019   LDLCALC 103 (H) 05/26/2012   ALT 12 08/25/2019   AST 17 08/25/2019   NA 136 08/30/2019   K 4.5 08/30/2019   CL 101 08/30/2019   CREATININE 0.94 08/30/2019   BUN 20 08/30/2019   CO2 26 08/30/2019   TSH 2.12 05/01/2019   PSA 0.33 04/02/2019   INR 1.6 (H) 08/27/2019   HGBA1C 5.5 08/25/2019      PT/INR:  Recent Labs    08/27/19 1618  LABPROT 18.8*  INR 1.6*   Radiology: No results found.   Assessment/Plan: S/P Procedure(s) (LRB): BENTALL PROCEDURE with a 27 mm KONECT Resilia Aortic Valved Conduit and 30 mm x 30 cm Hemashield Platinum straight graft. (N/A) CORONARY ARTERY BYPASS GRAFTING (CABG) times 2 using right saphenous endoscopic vein harvesting to OM2 nad PDA. (N/A) TRANSESOPHAGEAL ECHOCARDIOGRAM (TEE) (N/A) INDOCYANINE GREEN FLUORESCENCE IMAGING (ICG) (N/A) Mobilize Plan for transfer to step-down: see transfer orders Expected Acute  Blood - loss Anemia- continue to monitor   Sinus rhythm    [name] 08/30/2019 7:51 AM

## 2019-08-31 ENCOUNTER — Inpatient Hospital Stay (HOSPITAL_COMMUNITY): Payer: Self-pay

## 2019-08-31 LAB — CBC
HCT: 23.2 % — ABNORMAL LOW (ref 39.0–52.0)
Hemoglobin: 7.8 g/dL — ABNORMAL LOW (ref 13.0–17.0)
MCH: 30.6 pg (ref 26.0–34.0)
MCHC: 33.6 g/dL (ref 30.0–36.0)
MCV: 91 fL (ref 80.0–100.0)
Platelets: 174 10*3/uL (ref 150–400)
RBC: 2.55 MIL/uL — ABNORMAL LOW (ref 4.22–5.81)
RDW: 14.9 % (ref 11.5–15.5)
WBC: 11.5 10*3/uL — ABNORMAL HIGH (ref 4.0–10.5)
nRBC: 0.6 % — ABNORMAL HIGH (ref 0.0–0.2)

## 2019-08-31 LAB — TYPE AND SCREEN
ABO/RH(D): A POS
Antibody Screen: NEGATIVE
Unit division: 0
Unit division: 0
Unit division: 0
Unit division: 0

## 2019-08-31 LAB — BASIC METABOLIC PANEL
Anion gap: 7 (ref 5–15)
BUN: 15 mg/dL (ref 8–23)
CO2: 27 mmol/L (ref 22–32)
Calcium: 7.3 mg/dL — ABNORMAL LOW (ref 8.9–10.3)
Chloride: 102 mmol/L (ref 98–111)
Creatinine, Ser: 0.8 mg/dL (ref 0.61–1.24)
GFR calc Af Amer: >60 mL/min (ref >60)
GFR calc non Af Amer: >60 mL/min (ref >60)
Glucose, Bld: 101 mg/dL — ABNORMAL HIGH (ref 70–99)
Potassium: 4.5 mmol/L (ref 3.5–5.1)
Sodium: 136 mmol/L (ref 135–145)

## 2019-08-31 LAB — BPAM RBC
Blood Product Expiration Date: 202010272359
Blood Product Expiration Date: 202010272359
Blood Product Expiration Date: 202010272359
Blood Product Expiration Date: 202010292359
ISSUE DATE / TIME: 202010010803
ISSUE DATE / TIME: 202010010803
ISSUE DATE / TIME: 202010012011
ISSUE DATE / TIME: 202010020943
Unit Type and Rh: 6200
Unit Type and Rh: 6200
Unit Type and Rh: 6200
Unit Type and Rh: 6200

## 2019-08-31 MED ORDER — FUROSEMIDE 10 MG/ML IJ SOLN
40.0000 mg | Freq: Once | INTRAMUSCULAR | Status: AC
  Administered 2019-08-31: 08:00:00 40 mg via INTRAVENOUS
  Filled 2019-08-31: qty 4

## 2019-08-31 MED ORDER — POTASSIUM CHLORIDE CRYS ER 20 MEQ PO TBCR
20.0000 meq | EXTENDED_RELEASE_TABLET | Freq: Every day | ORAL | Status: DC
  Administered 2019-08-31: 20 meq via ORAL
  Filled 2019-08-31: qty 1

## 2019-08-31 MED ORDER — FERROUS SULFATE 325 (65 FE) MG PO TABS
325.0000 mg | ORAL_TABLET | Freq: Two times a day (BID) | ORAL | Status: DC
  Administered 2019-08-31 – 2019-09-03 (×7): 325 mg via ORAL
  Filled 2019-08-31 (×7): qty 1

## 2019-08-31 NOTE — Addendum Note (Signed)
Addendum  created 08/31/19 [id number] by Josephine Igo, CRNA   Order list changed

## 2019-08-31 NOTE — Progress Notes (Signed)
      [address]       [place] [address]             [phone]      4 Days Post-Op Procedure(s) (LRB): BENTALL PROCEDURE with a 27 mm KONECT Resilia Aortic Valved Conduit and 30 mm x 30 cm Hemashield Platinum straight graft. (N/A) CORONARY ARTERY BYPASS GRAFTING (CABG) times 2 using right saphenous endoscopic vein harvesting to OM2 nad PDA. (N/A) TRANSESOPHAGEAL ECHOCARDIOGRAM (TEE) (N/A) INDOCYANINE GREEN FLUORESCENCE IMAGING (ICG) (N/A)   Subjective:  Patient states he feels really good.  Denies chest pain, shortness of breath, N/V  Objective: Vital signs in last 24 hours: Temp:  [98 F (36.7 C)-98.3 F (36.8 C)] 98 F (36.7 C) (10/04 2300) Pulse Rate:  [58-74] 69 (10/05 0733) Cardiac Rhythm: Normal sinus rhythm (10/05 0400) Resp:  [11-25] 14 (10/05 0733) BP: (108-137)/(70-88) 118/70 (10/05 0600) SpO2:  [68 %-100 %] 96 % (10/05 0733) Weight:  [78.4 kg] 78.4 kg (10/05 0600)  Intake/Output from previous day: 10/04 0701 - 10/05 0700 In: 470 [P.O.:470] Out: 1200 [Urine:1200]  General appearance: alert, cooperative and no distress Heart: regular rate and rhythm Lungs: diminished breath sounds left base Abdomen: soft, non-tender; bowel sounds normal; no masses,  no organomegaly Extremities: edema 1+  Wound: clean and dry  Lab Results: Recent Labs    08/30/19 0305 08/31/19 0225  WBC 13.2* 11.5*  HGB 7.9* 7.8*  HCT 23.6* 23.2*  PLT 126* 174   BMET:  Recent Labs    08/30/19 0305 08/31/19 0225  NA 136 136  K 4.5 4.5  CL 101 102  CO2 26 27  GLUCOSE 113* 101*  BUN 20 15  CREATININE 0.94 0.80  CALCIUM 7.3* 7.3*    PT/INR: No results for input(s): LABPROT, INR in the last 72 hours. ABG    Component Value Date/Time   PHART 7.366 08/28/2019 0321   HCO3 21.9 08/28/2019 0321   TCO2 23 08/28/2019 0321   ACIDBASEDEF 3.0 (H) 08/28/2019 0321   O2SAT 96.0 08/28/2019 0321   CBG (last 3)  Recent Labs    08/28/19 0928 08/28/19 1207   GLUCAP 88 117*    Assessment/Plan: S/P Procedure(s) (LRB): BENTALL PROCEDURE with a 27 mm KONECT Resilia Aortic Valved Conduit and 30 mm x 30 cm Hemashield Platinum straight graft. (N/A) CORONARY ARTERY BYPASS GRAFTING (CABG) times 2 using right saphenous endoscopic vein harvesting to OM2 nad PDA. (N/A) TRANSESOPHAGEAL ECHOCARDIOGRAM (TEE) (N/A) INDOCYANINE GREEN FLUORESCENCE IMAGING (ICG) (N/A)  1. CV- Sinus bradycardia, BP controlled- on Lopressor  2. Pulm- small left pleural effusion, wean oxygen as tolerated, continue IS 3. Renal- creatinine WNL, weigth is elevated 25 lbs, will give IV lasix today, supplement K 4. Expected post operative blood loss anemia, hgb at 7.8, monitor 5. Dispo- patient stable, transferring to 4E today   LOS: 4 days    Ellwood Handler 08/31/2019

## 2019-08-31 NOTE — Progress Notes (Signed)
CARDIAC REHAB PHASE I   PRE:  Rate/Rhythm: 40 SR  BP:  Sitting: 119/93      SaO2: 94 RA  MODE:  Ambulation: 900 ft   POST:  Rate/Rhythm: 82 SR  BP:  Sitting: 125/66    SaO2: 97 RA  Pt ambulated 946ft in hallway standby assist with slow, steady gait. Pt denies pain or SOB. Some DOE noted. Pt able to maintain sats on room air. Encouraged continued ambulation, IS use, and sitting in the chair. Will continue to follow.  [place], RN BSN 08/31/2019 3:13 PM

## 2019-09-01 MED ORDER — FUROSEMIDE 10 MG/ML IJ SOLN
40.0000 mg | Freq: Once | INTRAMUSCULAR | Status: AC
  Administered 2019-09-01: 40 mg via INTRAVENOUS
  Filled 2019-09-01: qty 4

## 2019-09-01 MED ORDER — LACTULOSE 10 GM/15ML PO SOLN
20.0000 g | Freq: Once | ORAL | Status: AC
  Administered 2019-09-01: 08:00:00 20 g via ORAL
  Filled 2019-09-01: qty 30

## 2019-09-01 MED ORDER — FERROUS SULFATE 325 (65 FE) MG PO TABS: 325.0000 mg | ORAL_TABLET | Freq: Every day | ORAL | 3 refills | Status: DC

## 2019-09-01 MED ORDER — ASPIRIN 325 MG PO TBEC: 325.0000 mg | DELAYED_RELEASE_TABLET | Freq: Every day | ORAL | 0 refills | Status: DC

## 2019-09-01 NOTE — Progress Notes (Addendum)
      [address]       [place] [address]             [phone]        5 Days Post-Op Procedure(s) (LRB): BENTALL PROCEDURE with a 27 mm KONECT Resilia Aortic Valved Conduit and 30 mm x 30 cm Hemashield Platinum straight graft. (N/A) CORONARY ARTERY BYPASS GRAFTING (CABG) times 2 using right saphenous endoscopic vein harvesting to OM2 nad PDA. (N/A) TRANSESOPHAGEAL ECHOCARDIOGRAM (TEE) (N/A) INDOCYANINE GREEN FLUORESCENCE IMAGING (ICG) (N/A)  Subjective: Patient has not had a bowel movement yet.  Objective: Vital signs in last 24 hours: Temp:  [98 F (36.7 C)-98.3 F (36.8 C)] 98 F (36.7 C) (10/06 0433) Pulse Rate:  [58-69] 63 (10/06 0433) Cardiac Rhythm: Normal sinus rhythm (10/05 2118) Resp:  [14-25] 15 (10/06 0000) BP: (115-130)/(72-93) 120/77 (10/06 0433) SpO2:  [85 %-99 %] 85 % (10/06 0433) Weight:  [77.3 kg] 77.3 kg (10/06 0433)  Pre op weight 71.2 kg Current Weight  09/01/19 77.3 kg       Intake/Output from previous day: 10/05 0701 - 10/06 0700 In: 123 [P.O.:120; I.V.:3] Out: 750 [Urine:750]   Physical Exam:  Cardiovascular: RRR Pulmonary: Slightly diminished bibasilar breath sounds Abdomen: Soft, non tender, bowel sounds present. Extremities: Mild bilateral lower extremity edema. Wounds: Clean and dry.  No erythema or signs of infection.  Lab Results: CBC: Recent Labs    08/30/19 0305 08/31/19 0225  WBC 13.2* 11.5*  HGB 7.9* 7.8*  HCT 23.6* 23.2*  PLT 126* 174   BMET:  Recent Labs    08/30/19 0305 08/31/19 0225  NA 136 136  K 4.5 4.5  CL 101 102  CO2 26 27  GLUCOSE 113* 101*  BUN 20 15  CREATININE 0.94 0.80  CALCIUM 7.3* 7.3*    PT/INR:  Lab Results  Component Value Date   INR 1.6 (H) 08/27/2019   INR 1.0 08/25/2019   ABG:  INR: Will add last result for INR, ABG once components are confirmed Will add last 4 CBG results once components are confirmed  Assessment/Plan:  1. CV - SR with HR in the 60's  this am. On Lopressor 12.5 mg bid 2.  Pulmonary - On room air. 3. Volume Overload - Will give Lasix 40 mg IV bid today 4.  Acute blood loss anemia - H and H yesterday stable at 7.8 and 23.2. Continue Ferrous sulfate 5. Hypothyroidism-continue Levothyroxine 125 mcg daily 6. I will return to remove EPW as patient just received laxative and wants to walk 7. LOC constipation 8. As discussed with Dr. Orvan Seen, will likely discharge in am  [facility] ZimmermanPA-C 09/01/2019,7:19 AM [phone]

## 2019-09-01 NOTE — Progress Notes (Signed)
CARDIAC REHAB PHASE I   PRE:  Rate/Rhythm: 60 SR    BP: sitting 136/77    SaO2: 88-90 RA  MODE:  Ambulation: 1700 ft   POST:  Rate/Rhythm: 81 SR    BP: sitting 115/77     SaO2: 87 RA, up to 91 RA with rest and PLB   Pt moving well. Able to walk slow and steady independently long distance. He is eager to have BM. Sts his legs feel less heavy. SaO2 borderline low but no SOB. Encouraged more IS use. Hitting 900 mL. Pt did have 3-4 bts of ? afib after walk. No further issues. Got lopressor 1 hr ago. [place], ACSM 09/01/2019 10:30 AM

## 2019-09-01 NOTE — Progress Notes (Signed)
Patient was placed lying down in bed. EPW were removed without difficulty. Patient instructed to remain in bed for one hour from the time of removal. He has been given a laxative and wants to drink prune juice. I have asked his nurse Vicente Males to please get him a bedside commode as he is on bedrest for one hour. Patient's vital signs remained stable immediately after removal.

## 2019-09-02 ENCOUNTER — Inpatient Hospital Stay (HOSPITAL_COMMUNITY): Payer: Self-pay

## 2019-09-02 ENCOUNTER — Encounter (HOSPITAL_COMMUNITY): Payer: Self-pay | Admitting: Physician Assistant

## 2019-09-02 HISTORY — PX: IR THORACENTESIS ASP PLEURAL SPACE W/IMG GUIDE: IMG5380

## 2019-09-02 LAB — BASIC METABOLIC PANEL
Anion gap: 8 (ref 5–15)
BUN: 13 mg/dL (ref 8–23)
CO2: 30 mmol/L (ref 22–32)
Calcium: 7.9 mg/dL — ABNORMAL LOW (ref 8.9–10.3)
Chloride: 97 mmol/L — ABNORMAL LOW (ref 98–111)
Creatinine, Ser: 0.86 mg/dL (ref 0.61–1.24)
GFR calc Af Amer: >60 mL/min (ref >60)
GFR calc non Af Amer: >60 mL/min (ref >60)
Glucose, Bld: 106 mg/dL — ABNORMAL HIGH (ref 70–99)
Potassium: 3.5 mmol/L (ref 3.5–5.1)
Sodium: 135 mmol/L (ref 135–145)

## 2019-09-02 MED ORDER — POTASSIUM CHLORIDE CRYS ER 20 MEQ PO TBCR: 20.0000 meq | EXTENDED_RELEASE_TABLET | Freq: Every day | ORAL | 0 refills | Status: DC

## 2019-09-02 MED ORDER — ATORVASTATIN CALCIUM 20 MG PO TABS: 20.0000 mg | ORAL_TABLET | Freq: Every day | ORAL | 1 refills | Status: DC

## 2019-09-02 MED ORDER — FUROSEMIDE 10 MG/ML IJ SOLN
40.0000 mg | Freq: Once | INTRAMUSCULAR | Status: AC
  Administered 2019-09-02: 40 mg via INTRAVENOUS
  Filled 2019-09-02: qty 4

## 2019-09-02 MED ORDER — TRAMADOL HCL 50 MG PO TABS: 50.0000 mg | ORAL_TABLET | ORAL | 0 refills | Status: DC | PRN

## 2019-09-02 MED ORDER — POTASSIUM CHLORIDE CRYS ER 20 MEQ PO TBCR
20.0000 meq | EXTENDED_RELEASE_TABLET | Freq: Every day | ORAL | Status: DC
  Administered 2019-09-03: 20 meq via ORAL
  Filled 2019-09-02 (×2): qty 1

## 2019-09-02 MED ORDER — FUROSEMIDE 40 MG PO TABS: 40.0000 mg | ORAL_TABLET | Freq: Every day | ORAL | 0 refills | Status: DC

## 2019-09-02 MED ORDER — ACETAMINOPHEN 325 MG PO TABS: 650.0000 mg | ORAL_TABLET | Freq: Four times a day (QID) | ORAL | Status: DC | PRN

## 2019-09-02 MED ORDER — MORPHINE SULFATE (PF) 2 MG/ML IV SOLN
2.0000 mg | INTRAVENOUS | Status: DC | PRN
  Administered 2019-09-02: 2 mg via INTRAVENOUS
  Filled 2019-09-02: qty 1

## 2019-09-02 MED ORDER — MORPHINE SULFATE (PF) 2 MG/ML IV SOLN
INTRAVENOUS | Status: AC
  Filled 2019-09-02: qty 1

## 2019-09-02 MED ORDER — LIDOCAINE HCL 1 % IJ SOLN
INTRAMUSCULAR | Status: DC | PRN
  Administered 2019-09-02: 10 mL

## 2019-09-02 MED ORDER — METOPROLOL TARTRATE 25 MG PO TABS: 12.5000 mg | ORAL_TABLET | Freq: Two times a day (BID) | ORAL | 1 refills | Status: DC

## 2019-09-02 MED ORDER — MORPHINE SULFATE (PF) 2 MG/ML IV SOLN
2.0000 mg | INTRAVENOUS | Status: AC | PRN
  Administered 2019-09-02: 2 mg via INTRAVENOUS
  Filled 2019-09-02: qty 1

## 2019-09-02 MED ORDER — POTASSIUM CHLORIDE CRYS ER 20 MEQ PO TBCR
40.0000 meq | EXTENDED_RELEASE_TABLET | Freq: Once | ORAL | Status: AC
  Administered 2019-09-02: 40 meq via ORAL
  Filled 2019-09-02: qty 2

## 2019-09-02 MED ORDER — KETOCONAZOLE 2 % EX SHAM: 1.0000 "application " | MEDICATED_SHAMPOO | CUTANEOUS | Status: DC

## 2019-09-02 MED ORDER — MORPHINE SULFATE (PF) 2 MG/ML IV SOLN
2.0000 mg | INTRAVENOUS | Status: DC | PRN
  Administered 2019-09-02: 1 mg via INTRAVENOUS
  Administered 2019-09-02: 2 mg via INTRAVENOUS
  Filled 2019-09-02: qty 1

## 2019-09-02 MED ORDER — IOHEXOL 300 MG/ML  SOLN
75.0000 mL | Freq: Once | INTRAMUSCULAR | Status: AC | PRN
  Administered 2019-09-02: 75 mL via INTRAVENOUS

## 2019-09-02 MED ORDER — LIDOCAINE HCL 1 % IJ SOLN
INTRAMUSCULAR | Status: AC
  Filled 2019-09-02: qty 20

## 2019-09-02 MED ORDER — LOSARTAN POTASSIUM 25 MG PO TABS
25.0000 mg | ORAL_TABLET | Freq: Every day | ORAL | Status: DC
  Administered 2019-09-02 – 2019-09-03 (×2): 25 mg via ORAL
  Filled 2019-09-02 (×2): qty 1

## 2019-09-02 MED FILL — Mannitol IV Soln 20%: INTRAVENOUS | Qty: 500 | Status: AC

## 2019-09-02 MED FILL — Sodium Chloride IV Soln 0.9%: INTRAVENOUS | Qty: 2000 | Status: AC

## 2019-09-02 MED FILL — Electrolyte-R (PH 7.4) Solution: INTRAVENOUS | Qty: 5000 | Status: AC

## 2019-09-02 NOTE — Progress Notes (Addendum)
      [address]       [place] [address]             [phone]        6 Days Post-Op Procedure(s) (LRB): BENTALL PROCEDURE with a 27 mm KONECT Resilia Aortic Valved Conduit and 30 mm x 30 cm Hemashield Platinum straight graft. (N/A) CORONARY ARTERY BYPASS GRAFTING (CABG) times 2 using right saphenous endoscopic vein harvesting to OM2 nad PDA. (N/A) TRANSESOPHAGEAL ECHOCARDIOGRAM (TEE) (N/A) INDOCYANINE GREEN FLUORESCENCE IMAGING (ICG) (N/A)  Subjective: Patient had a bowel movement yesterday. He did not sleep much. He hopes to go home today.  Objective: Vital signs in last 24 hours: Temp:  [98 F (36.7 C)-99.2 F (37.3 C)] 98 F (36.7 C) (10/07 0540) Pulse Rate:  [59-71] 59 (10/07 0540) Cardiac Rhythm: Normal sinus rhythm (10/06 2215) Resp:  [15-25] 18 (10/07 0540) BP: (109-145)/(75-98) 145/90 (10/07 0540) SpO2:  [91 %-94 %] 94 % (10/07 0540) Weight:  [74.4 kg] 74.4 kg (10/07 0540)  Pre op weight 71.2 kg Current Weight  09/02/19 74.4 kg       Intake/Output from previous day: No intake/output data recorded.   Physical Exam:  Cardiovascular: RRR Pulmonary: Slightly diminished bibasilar breath sounds Abdomen: Soft, non tender, bowel sounds present. Extremities: Mild bilateral lower extremity edema. Wounds: Clean and dry.  No erythema or signs of infection.  Lab Results: CBC: Recent Labs    08/31/19 0225  WBC 11.5*  HGB 7.8*  HCT 23.2*  PLT 174   BMET:  Recent Labs    08/31/19 0225 09/02/19 0145  NA 136 135  K 4.5 3.5  CL 102 97*  CO2 27 30  GLUCOSE 101* 106*  BUN 15 13  CREATININE 0.80 0.86  CALCIUM 7.3* 7.9*    PT/INR:  Lab Results  Component Value Date   INR 1.6 (H) 08/27/2019   INR 1.0 08/25/2019   ABG:  INR: Will add last result for INR, ABG once components are confirmed Will add last 4 CBG results once components are confirmed  Assessment/Plan:  1. CV - SR, first degree heart block with HR in the low 70's  this am. On Lopressor 12.5 mg bid. Will restart low dose Losartan. 2.  Pulmonary - He was put on oxygen via [place] last evening. Will have nurse document to see if will need oxygen at discharge. CXR this am shows bilateral pleural effusions L>R, although left is slowly decreasing. Encourage incentive spirometer. 3. Volume Overload - He was given Lasix 40 mg IV bid.  yesterday. Unsure of I/Os as not documented. Will give IV again this am 4.  Acute blood loss anemia - H and H yesterday stable at 7.8 and 23.2. Continue Ferrous sulfate 5. Hypothyroidism-continue Levothyroxine 125 mcg daily 6. Supplement potassium 7. Will discuss disposition with Dr. Pauletta Browns M ZimmermanPA-C 09/02/2019,7:13 AM [phone]

## 2019-09-02 NOTE — Progress Notes (Signed)
Pt still with 10/10 pain in left chest. VSS. Pt sitting  on side of bed groaning. Dr. Orvan Seen paged, orders received for 2mg  morphine q30 minutes for pain.  Clyde Canterbury, RN

## 2019-09-02 NOTE — Progress Notes (Signed)
Pt transported to CT scan w/ RN without complications. Returned to room and made comfortable in chair. Call light in reach.   Clyde Canterbury, RN

## 2019-09-02 NOTE — Progress Notes (Signed)
Pt complain of soreness in L shoulder/back after thoracentesis. Tramadol given. Pt went on walk in hallway and complained of severe sharp left sided pain. PA and MD paged. Orders received for stat chest x-ray. 3L [place] applied, 02 96 %, BP 97/79 (83), HR 75.

## 2019-09-02 NOTE — Progress Notes (Signed)
I viewed PA/LAT CXR. He still has pleural effusions L>R. As discussed with Dr. Orvan Seen, will have IR do a thoracentesis and likely discharge in am.

## 2019-09-02 NOTE — Procedures (Signed)
PROCEDURE SUMMARY:  Successful US guided left thoracentesis. Yielded 600 mL of dark red fluid. Patient tolerated procedure well. No immediate complications. EBL = trace  Post procedure chest X-ray reveals no pneumothorax  Jeffrey Tucker S Mishika Flippen PA-C 09/02/2019 1:39 PM

## 2019-09-02 NOTE — Progress Notes (Addendum)
Nurse paged me because patient had a lot of pain on left shoulder and back awhile after returning from left thoracentesis. Stat portable chest x ray does NOT show significant pneumothorax. Patient's vital signs are stable. His symptoms do not appear clinically compatible with PE. As discussed with Dr. Orvan Seen, likely secondary to re expansion of lung (600 ml fluid removed). Morphine PRN pain and re check CXR in am.

## 2019-09-02 NOTE — Progress Notes (Signed)
Pt SaO2 at rest is 92 RA. Pt up walking hall SaO2 is 93 RA Discussed sternal precautions, IS (now practicing more, up to 1250 today), exercise, diet, and CRPII. Will refer to [place] as he does not have insurance. Pt receptive. Pt is interested in participating in Virtual Cardiac and Pulmonary Rehab. Pt advised that Virtual Cardiac and Pulmonary Rehab is provided at no cost to the patient.  Checklist:  1. Pt has smart device  ie smartphone and/or ipad for downloading an app  Yes 2. Reliable internet/wifi service    Yes 3. Understands how to use their smartphone and navigate within an app.  Yes  Pt verbalized understanding and is in agreement. [place] CES, ACSM 9:58 AM 09/02/2019

## 2019-09-02 NOTE — Progress Notes (Signed)
Patient with continual pain with moaning sitting on side of bed. complaints of sharp pain in left chest. Morphine given as ordered as needed for pain. Will continue to monitor patient.   Clyde Canterbury RN

## 2019-09-03 NOTE — Progress Notes (Addendum)
      [address]       ,[place] [address]             [phone]        7 Days Post-Op Procedure(s) (LRB): BENTALL PROCEDURE with a 27 mm KONECT Resilia Aortic Valved Conduit and 30 mm x 30 cm Hemashield Platinum straight graft. (N/A) CORONARY ARTERY BYPASS GRAFTING (CABG) times 2 using right saphenous endoscopic vein harvesting to OM2 nad PDA. (N/A) TRANSESOPHAGEAL ECHOCARDIOGRAM (TEE) (N/A) INDOCYANINE GREEN FLUORESCENCE IMAGING (ICG) (N/A)  Subjective: Patient had fairly severe pain left shoulder/back after returning from left thoracentesis. He was given Morphine multiple times yesterday. He states he feels better this am and slept well. He is able to take shallow breaths without much pain, but when takes deeper breaths, has pain.  Objective: Vital signs in last 24 hours: Temp:  [97.7 F (36.5 C)-99.3 F (37.4 C)] 98.4 F (36.9 C) (10/08 0635) Pulse Rate:  [69-154] 69 (10/08 0635) Cardiac Rhythm: Heart block (10/07 1900) Resp:  [14-25] 14 (10/08 0635) BP: (97-140)/(77-116) 119/78 (10/08 0635) SpO2:  [88 %-100 %] 98 % (10/08 0635) Weight:  [74.3 kg] 74.3 kg (10/08 0635)  Pre op weight 71.2 kg Current Weight  09/03/19 74.3 kg       Intake/Output from previous day: 10/07 0701 - 10/08 0700 In: 480 [P.O.:480] Out: 100 [Urine:100]   Physical Exam:  Cardiovascular: RRR Pulmonary: Mostly clear Abdomen: Soft, non tender, bowel sounds present. Extremities:Mild bilateral lower extremity edema. Wounds: Clean and dry.  No erythema or signs of infection.  Lab Results: CBC: No results for input(s): WBC, HGB, HCT, PLT in the last 72 hours. BMET:  Recent Labs    09/02/19 0145  NA 135  K 3.5  CL 97*  CO2 30  GLUCOSE 106*  BUN 13  CREATININE 0.86  CALCIUM 7.9*    PT/INR:  Lab Results  Component Value Date   INR 1.6 (H) 08/27/2019   INR 1.0 08/25/2019   ABG:  INR: Will add last result for INR, ABG once components are confirmed Will add  last 4 CBG results once components are confirmed  Assessment/Plan:  1. CV - SR, first degree heart block with HR in the low 60's this am. On Lopressor 12.5 mg bid and Losartan 25 mg daily. SBP in the 110's so will stop Losartan;hopefully, can resume low dose after discharge. 2.  Pulmonary - He is on room air this am. CT done last evening-small b/l pleural effusions and atelectasis, stable cardiomegaly, and complex fluid surrounding the aortic root and proximal arch likely reflecting small amount hemorrhage, which is likely postsurgical. As discussed with Dr. Orvan Seen, his pain may be from expansion left lung. Encourage incentive spirometer. 3. Volume Overload - Continue with Lasix 40 mg orally daily 4.  Acute blood loss anemia - Last H and H stable at 7.8 and 23.2. Continue Ferrous sulfate 5. Hypothyroidism-continue Levothyroxine 125 mcg daily 6. As discussed with Dr. Orvan Seen, will likely discharge later today  Sharalyn Ink ZimmermanPA-C 09/03/2019,7:38 AM [phone]

## 2019-09-03 NOTE — Progress Notes (Signed)
CARDIAC REHAB PHASE I   Pt seen ambulating in hallway with wife. Pt with slow guarded gait. Pt and wife appreciative of talk with Dr. Orvan Seen and feel more at ease with discharge. Pt encouraged to continue IS and [place] use, and walks. Pt and wife deny further questions or concerns. Pt referred to CRP II [place].  [id number] Rufina Falco, RN BSN 09/03/2019 3:07 PM

## 2019-09-03 NOTE — Progress Notes (Signed)
Discharge instructions given to patient and spouse. IV removed, clean and intact. Wound care and medications reviewed. Patient to be sent home with wife.   Arletta Bale, RN

## 2019-09-03 NOTE — Progress Notes (Signed)
Cardiology Office Note    Date:  09/15/2019   ID:  Jeffrey Tucker, DOB [birth year], MRN [id number]  PCP:  Venia Carbon, MD  Cardiologist:  Ida Rogue, MD  Electrophysiologist:  None   Chief Complaint: Hospital follow-up  History of Present Illness:   JOSEPHINE MOSCHELLA is a 63 y.o. male with history of CAD status post two-vessel CABG as detailed below, ascending aortic aneurysm and bicuspid aortic valve with severe valvular regurgitation status post Bio Bentall procedure, pulmonary hypertension, prior tobacco abuse, asthma, hypertension, hypertriglyceridemia, hypothyroidism, and BPH who presents for hospital follow-up as detailed below.  Patient was noted to have a murmur on exam back in 2012 with subsequent echo at that time showing an EF of 50 to 55%, mild LVH, normal diastolic function, images concerning for bicuspid aortic valve with severe regurgitation, mild to moderately dilated aortic root, mild to moderately dilated ascending aorta, and mild mitral regurgitation.  Over the years, he was monitored with periodic echocardiograms with echo from 05/2019 showing progression of his structural cardiovascular disease, which showed an EF of 50-55% with prior study from 2018 showing an EF greater than 55%, moderate to severe aortic valve regurgitation, dilated aortic root measuring 4.9 cm, ascending aorta measuring 4.4 cm, aortic arch measuring 3.7 cm.  In this setting, he underwent CTA of the aorta on 07/24/2019 which showed aneurysmal disease of the thoracic aorta with the aortic root measuring up to approximately 5 cm in diameter at the level of the sinuses of Valsalva, ascending thoracic aorta measured approximately 4.3 cm at its greatest diameter, as well as coronary artery calcium noted in the LAD, LCx, and RCA distributions.  Given this, he underwent diagnostic cardiac cath on 08/21/2019 which showed two-vessel CAD with proximal to mid LCx 80% stenosis, mid LCx 80% stenosis, distal RCA 70%  stenosis, RPDA 70% stenosis, and hemodynamic findings consistent with moderate pulmonary hypertension.  In this setting, patient was evaluated by Dr. Orvan Seen with CVTS with recommendation to proceed with surgical repair.    Patient underwent two-vessel CABG with SVG to OM and SVG to PDA as well as bio Bentall procedure with a 27 mm KONECT Resilia bioprosthetic aortic valve and distal hemi-arch reconstruction with 33 mm Dacron graft on 08/27/2019.  Postoperative course was notable for expected acute blood loss anemia requiring 2 units of packed red blood cells, volume overload requiring diuresis, subsequent desaturation with follow-up chest x-ray showing bilateral pleural effusions with the left being greater than the right requiring left thoracentesis on 10/7 with 600 mL of dark fluid removed.  Following this, the patient reported left back and shoulder pain with follow-up chest x-ray showing no pneumothorax and a basilar density on the left which was felt to possibly be related to reexpansion or edema.  Despite treatment with morphine the pain persisted leading to CT of the chest which showed bilateral pleural effusions and atelectasis, stable cardiomegaly, and complex fluid surrounding the aortic root and proximal arch felt to likely reflect a small amount of hemorrhage which was felt to be postsurgical.  Documented discharge weight of 74.3 kg.  Discharge labs showed a potassium of 3.5, BUN 13, serum creatinine 0.86, WBC 11.5, Hgb 7.8, PLT 174, magnesium 2.5.  He was seen by CVTS in follow-up on 09/11/2019 and was doing well from a surgical perspective.  Post operative CXR showed stable cardiomegaly without pulmonary edema. Improved bibasilar atelectasis with decreased right pleural effusion and a slightly increased small left pleural effusion.    We  received a My Chart message from the patient noting "lung twitching" and coughing episodes leading to significant pain.   Patient comes in accompanied by his  wife today.  Overall he is quite pleased with his progress postoperatively and feels like he has significantly improved.  However, over the past several days he has developed involuntary abdominal spasms followed by cyclic dry coughing with associated significant chest discomfort.  Symptoms are worse when attempting to lay supine and seem to be improved when sitting up with both feet flat on the ground.  Video of this episode appeared to last for approximately 60 seconds.  No issues from his surgical site.  He has noted some mild lower extremity swelling that is improving.  Towards the end of our visit I was informed the patient has developed a fever within the past 24 hours with a T-max of 102 on 09/14/2019 with a temperature of 100.2 this morning status post Tylenol.  There has been some associated nasal congestion.  However, patient indicates he has bad asthma and allergies and is congested to some degree at baseline.  There has been some loss of taste with food postoperatively as well.   Past Medical History:  Diagnosis Date   Allergy    allergic rhinitis   Aortic root dilatation (HCC)    a. s/p surgical repair 08/27/2019   Asthma    mostly in childhood   Bicuspid aortic valve    a.  Status post bioprosthetic replacement 08/27/2019   BPH (benign prostatic hypertrophy)    CAD (coronary artery disease)    a. s/p 2-v CABG 08/2019 (SVG-OM, SVG--rPDA)   GERD (gastroesophageal reflux disease)    History of kidney stones    Hypertension    Hypertriglyceridemia    Hypothyroidism    Severe aortic regurgitation    a. bicuspid aortic valve, b. s/p bio Bentall procedure 08/2019    Past Surgical History:  Procedure Laterality Date   BENTALL PROCEDURE N/A 08/27/2019   Procedure: BENTALL PROCEDURE with a 27 mm KONECT Resilia Aortic Valved Conduit and 30 mm x 30 cm Hemashield Platinum straight graft.;  Surgeon: Wonda Olds, MD;  Location: [facility];  Service: Open Heart Surgery;   Laterality: N/A;   CARDIAC CATHETERIZATION  2006   [place]   CARDIOVASCULAR STRESS TEST  2006   [place]   CHOLECYSTECTOMY  2006   CORONARY ARTERY BYPASS GRAFT N/A 08/27/2019   Procedure: CORONARY ARTERY BYPASS GRAFTING (CABG) times 2 using right saphenous endoscopic vein harvesting to OM2 nad PDA.;  Surgeon: Wonda Olds, MD;  Location: [facility];  Service: Open Heart Surgery;  Laterality: N/A;   CYSTOSCOPY W/ URETERAL STENT PLACEMENT  05/24/2017   Procedure: left;  Surgeon: Nickie Retort, MD;  Location: [facility];  Service: Urology;;   CYSTOSCOPY W/ URETERAL STENT PLACEMENT Left 06/11/2017   Procedure: CYSTOSCOPY WITH STENT REPLACEMENT;  Surgeon: Hollice Espy, MD;  Location: [facility];  Service: Urology;  Laterality: Left;   CYSTOSCOPY WITH BIOPSY Left 06/11/2017   Procedure: RENAL PELVIS TUMOR WITH BIOPSY WITH POSSIBLE LASER ABLATION;  Surgeon: Hollice Espy, MD;  Location: [facility];  Service: Urology;  Laterality: Left;   CYSTOSCOPY WITH STENT PLACEMENT Left 05/24/2017   Procedure: , cystoscopy,left ureteral stent placement and left ureteroscopy attempted, retrograde pylogram;  Surgeon: Nickie Retort, MD;  Location: [facility];  Service: Urology;  Laterality: Left;   EYE SURGERY Bilateral    Cataract Extraction with IOL   IR THORACENTESIS ASP PLEURAL SPACE W/IMG  GUIDE  09/02/2019   RIGHT/LEFT HEART CATH AND CORONARY ANGIOGRAPHY N/A 08/21/2019   Procedure: RIGHT/LEFT HEART CATH AND CORONARY ANGIOGRAPHY;  Surgeon: Minna Merritts, MD;  Location: [facility];  Service: Cardiovascular;  Laterality: N/A;   TEE WITHOUT CARDIOVERSION N/A 08/27/2019   Procedure: TRANSESOPHAGEAL ECHOCARDIOGRAM (TEE);  Surgeon: Wonda Olds, MD;  Location: [facility];  Service: Open Heart Surgery;  Laterality: N/A;   URETEROSCOPY WITH HOLMIUM LASER LITHOTRIPSY Left 06/11/2017   Procedure: URETEROSCOPY WITH HOLMIUM LASER LITHOTRIPSY;  Surgeon: Hollice Espy, MD;  Location: [facility];   Service: Urology;  Laterality: Left;   VEIN LIGATION AND STRIPPING Bilateral 1997    Current Medications: Current Meds  Medication Sig   acetaminophen (TYLENOL) 325 MG tablet Take 2 tablets (650 mg total) by mouth every 6 (six) hours as needed for mild pain.   albuterol (VENTOLIN HFA) 108 (90 Base) MCG/ACT inhaler INHALE 2 PUFFS BY MOUTH THREE TIMES DAILY AS NEEDED FOR ASTHMA FLARE   aspirin EC 325 MG EC tablet Take 1 tablet (325 mg total) by mouth daily.   atorvastatin (LIPITOR) 20 MG tablet Take 1 tablet (20 mg total) by mouth daily at 6 PM.   cetirizine (ZYRTEC) 10 MG tablet Take 10 mg by mouth daily.   ferrous sulfate 325 (65 FE) MG tablet Take 1 tablet (325 mg total) by mouth daily with breakfast. For one month then stop. If develop constipation, may stop sooner   Fluticasone-Salmeterol (ADVAIR) 100-50 MCG/DOSE AEPB Inhale 1 puff into the lungs 2 (two) times daily.   guaiFENesin (MUCINEX) 600 MG 12 hr tablet Take by mouth 2 (two) times daily.   ketoconazole (NIZORAL) 2 % shampoo Apply 1 application topically once a week.   levothyroxine (SYNTHROID) 125 MCG tablet Take 1 tablet by mouth once daily (Patient taking differently: Take 125 mcg by mouth daily before breakfast. )   metoprolol tartrate (LOPRESSOR) 25 MG tablet Take 0.5 tablets (12.5 mg total) by mouth 2 (two) times daily.   polyethylene glycol (MIRALAX) 17 g packet Take 17 g by mouth daily as needed.   traMADol (ULTRAM) 50 MG tablet Take 1 tablet (50 mg total) by mouth every 4 (four) hours as needed for moderate pain.    Allergies:   Tetracycline hcl and Amlodipine   Social History   Socioeconomic History   Marital status: Married    Spouse name: Christiano Ledman   Number of children: 2   Years of education: Not on file   Highest education level: Not on file  Occupational History   Occupation: Sales promotion account executive   Occupation: web based "Surveyor, [employer]"    Comment: laid off   Occupation:  Holiday representative strain: Not on file   Food insecurity    Worry: Not on file    Inability: Not on file   Transportation needs    Medical: Not on file    Non-medical: Not on file  Tobacco Use   Smoking status: Former Smoker    Types: Cigarettes    Quit date: 11/26/1978    Years since quitting: 40.8   Smokeless tobacco: Never Used   Tobacco comment: social smoked till 1980's  Substance and Sexual Activity   Alcohol use: No    Alcohol/week: 0.0 standard drinks    Comment: none since 1984   Drug use: No   Sexual activity: Not on file  Lifestyle   Physical activity    Days per week: Not  on file    Minutes per session: Not on file   Stress: Not on file  Relationships   Social connections    Talks on phone: Not on file    Gets together: Not on file    Attends religious service: Not on file    Active member of club or organization: Not on file    Attends meetings of clubs or organizations: Not on file    Relationship status: Not on file  Other Topics Concern   Not on file  Social History Narrative   Not on file     Family History:  The patient's family history includes Alcohol abuse in his father and mother; Asthma in his mother; Liver cancer in his paternal grandmother; Prostate cancer in his father. There is no history of Colon cancer, Coronary artery disease, Hypertension, Diabetes, Bladder Cancer, or Kidney cancer.  ROS:   Review of Systems  Constitutional: Positive for malaise/fatigue. Negative for chills, diaphoresis, fever and weight loss.  HENT: Negative for congestion.   Eyes: Negative for discharge and redness.  Respiratory: Positive for cough. Negative for hemoptysis, sputum production, shortness of breath and wheezing.   Cardiovascular: Positive for leg swelling. Negative for chest pain, palpitations, orthopnea, claudication and PND.  Gastrointestinal: Negative for abdominal pain, blood in stool, heartburn,  melena, nausea and vomiting.  Genitourinary: Negative for hematuria.  Musculoskeletal: Negative for falls and myalgias.  Skin: Negative for rash.  Neurological: Positive for weakness. Negative for dizziness, tingling, tremors, sensory change, speech change, focal weakness and loss of consciousness.  Endo/Heme/Allergies: Does not bruise/bleed easily.  Psychiatric/Behavioral: Negative for substance abuse. The patient is not nervous/anxious.   All other systems reviewed and are negative.    EKGs/Labs/Other Studies Reviewed:    Studies reviewed were summarized above. The additional studies were reviewed today: As above.  EKG:  EKG is ordered today.  The EKG ordered today demonstrates NSR, 77 bpm, LVH with early repolarization abnormality, lateral TWI  Recent Labs: 05/01/2019: TSH 2.12 08/25/2019: ALT 12 08/28/2019: Magnesium 2.5 09/02/2019: BUN 13; Creatinine, Ser 0.86; Potassium 3.5; Sodium 135 09/15/2019: Hemoglobin 8.1; Platelets 629  Recent Lipid Panel    Component Value Date/Time   CHOL 184 04/02/2019 1003   TRIG 207.0 (H) 04/02/2019 1003   HDL 29.60 (L) 04/02/2019 1003   CHOLHDL 6 04/02/2019 1003   VLDL 41.4 (H) 04/02/2019 1003   LDLCALC 103 (H) 05/26/2012 1545   LDLDIRECT 107.0 04/02/2019 1003    PHYSICAL EXAM:    VS:  BP 120/70 (BP Location: Left Arm, Patient Position: Sitting, Cuff Size: Normal)    Pulse 77    Temp (!) 97 F (36.1 C)    Ht 5\' 6"  (1.676 m)    Wt 155 lb (70.3 kg)    SpO2 96%    BMI 25.02 kg/m   BMI: Body mass index is 25.02 kg/m.  Physical Exam  Constitutional: He is oriented to person, place, and time. He appears well-developed and well-nourished.  HENT:  Head: Normocephalic and atraumatic.  Eyes: Right eye exhibits no discharge. Left eye exhibits no discharge.  Neck: Normal range of motion. No JVD present.  Cardiovascular: Normal rate, regular rhythm, S1 normal, S2 normal and normal heart sounds. Exam reveals no distant heart sounds, no friction rub,  no midsystolic click and no opening snap.  No murmur heard. Pulses:      Posterior tibial pulses are 2+ on the right side and 2+ on the left side.  Surgical incision sites are well-healing  without signs of obvious infection  Pulmonary/Chest: Effort normal and breath sounds normal. No respiratory distress. He has no decreased breath sounds. He has no wheezes. He has no rales. He exhibits no tenderness.  Abdominal: Soft. He exhibits no distension. There is no abdominal tenderness.  Musculoskeletal:        General: No edema.  Neurological: He is alert and oriented to person, place, and time.  Skin: Skin is warm and dry. No cyanosis. Nails show no clubbing.  Psychiatric: He has a normal mood and affect. His speech is normal and behavior is normal. Judgment and thought content normal.    Wt Readings from Last 3 Encounters:  09/15/19 155 lb (70.3 kg)  09/11/19 156 lb (70.8 kg)  09/03/19 163 lb 14.4 oz (74.3 kg)     ASSESSMENT & PLAN:   1. CAD s/p 2 vessel CABG: He is doing well without any symptoms concerning for angina.  Continue full dose aspirin per CVTS.  Remains on atorvastatin as outlined below.  Lopressor has been discontinued given underlying asthma.  Cardiac rehab recommended.  Aggressive risk factor modification.  2. Bicuspid aortic valve/dilated aortic root: Status post aortic root and bioprosthetic aortic valve replacement at time of bypass.  SBE prophylaxis indefinitely.  Aspirin as above.  Follow-up with CVTS as recommended.  3. Spasmatic cough/asthma: Cannot exclude possibility of this being exacerbated by recently started metoprolol.  Case was discussed with CVTS coordinator.  After discussing with cardiothoracic surgeon we agreed to discontinue metoprolol.  Trial of Tessalon Perles.  Given recent fever with a T-max of 102 on 09/14/2019 and a temperature of 100.2 this morning at home patient has been swabbed for COVID-19.  4. Pulmonary hypertension: He appears euvolemic and well  compensated.  Not currently requiring standing diuretic therapy.  5. Asthma: Managed by PCP.  Continue Advair.  Metoprolol discontinued as above.  6. Hyperlipidemia: LDL of 107 from 03/2019 with goal LDL being less than 70.  Continue atorvastatin 20 mg daily.  Recommend fasting lipid panel at next visit.  7. Fever: COVID-19 test ordered as above.  Recommend he follow-up with CVTS and PCP.  8. Acute blood loss anemia/thrombocytosis: Stat CBC this morning showed slightly improved hemoglobin of 8.1.  Patient's thrombocytosis is likely inflammatory following his recent surgery.  Disposition: F/u with Dr. Rockey Situ or an APP in 4 weeks, sooner if needed.   Medication Adjustments/Labs and Tests Ordered: Current medicines are reviewed at length with the patient today.  Concerns regarding medicines are outlined above. Medication changes, Labs and Tests ordered today are summarized above and listed in the Patient Instructions accessible in Encounters.   Signed, Christell Faith, PA-C 09/15/2019 9:43 AM     [facility] [address] [place], [place] [address] [phone]

## 2019-09-09 ENCOUNTER — Encounter: Payer: Self-pay | Admitting: Physician Assistant

## 2019-09-10 ENCOUNTER — Encounter: Payer: Self-pay | Admitting: *Deleted

## 2019-09-10 ENCOUNTER — Ambulatory Visit
Admission: RE | Admit: 2019-09-10 | Discharge: 2019-09-10 | Disposition: A | Discharge status: Home / Self Care | Payer: No Typology Code available for payment source | Source: Ambulatory Visit | Attending: Cardiothoracic Surgery | Admitting: Cardiothoracic Surgery

## 2019-09-10 ENCOUNTER — Other Ambulatory Visit: Payer: Self-pay | Admitting: Cardiothoracic Surgery

## 2019-09-10 DIAGNOSIS — Z953 Presence of xenogenic heart valve: Secondary | ICD-10-CM

## 2019-09-10 NOTE — Progress Notes (Signed)
Dr. Orvan Seen was notified of [name] recent issues and that his CXR was complete.  After review, Dr. Loni Muse. said to reassure [name] that it was stable and he would see him tomorrow.  I did, but he is still concerned with his right lower posterior chest discomfort.  He says one Tramadol did not help his pain.  I told him he could take two tablets to try for more complete relief.  He will return tomorrow for his scheduled visit.

## 2019-09-10 NOTE — Telephone Encounter (Signed)
Jeffrey Tucker is s/p BENTALL on 08/27/19 by Dr. Murvin Natal.  He called today at 3:50 pm to relate an incident during the night of a coughing spasm that lasted quite a while and resulted in sever pain on inspiration on his right side, which is persistent.  He can now only reach half of what he was previously doing on his incentive spirometry.  He is coming for his post op visit tomorrow but is afraid this will happen again.  I offered for him to have his chest xray now to see if there was an acute process going on and he agreed.  Once the report has been received, Dr. Orvan Seen will be consulted.

## 2019-09-11 ENCOUNTER — Telehealth: Payer: Self-pay | Admitting: Cardiovascular Disease

## 2019-09-11 ENCOUNTER — Other Ambulatory Visit: Payer: Self-pay

## 2019-09-11 ENCOUNTER — Ambulatory Visit (INDEPENDENT_AMBULATORY_CARE_PROVIDER_SITE_OTHER): Payer: Self-pay | Admitting: Cardiothoracic Surgery

## 2019-09-11 VITALS — BP 121/76 | HR 79 | Temp 97.8°F | Resp 20 | Ht 66.0 in | Wt 156.0 lb

## 2019-09-11 DIAGNOSIS — I351 Nonrheumatic aortic (valve) insufficiency: Secondary | ICD-10-CM

## 2019-09-11 DIAGNOSIS — Z09 Encounter for follow-up examination after completed treatment for conditions other than malignant neoplasm: Secondary | ICD-10-CM

## 2019-09-11 DIAGNOSIS — I712 Thoracic aortic aneurysm, without rupture, unspecified: Secondary | ICD-10-CM

## 2019-09-11 DIAGNOSIS — Z953 Presence of xenogenic heart valve: Secondary | ICD-10-CM

## 2019-09-11 NOTE — Telephone Encounter (Signed)
Patient c/o twitchy spasm feeling in lungs after recent open heart surgery   Patient says this interferes with taking a full deep breath and it sometimes leads to a coughing spell   Patient asked if he discussed this with Dr. Orvan Seen today and he stated yes and he did not say anything about it

## 2019-09-11 NOTE — Telephone Encounter (Signed)
Returned call to patient, he reports that he had open heart surgery on oct 1, seen in clinic by surgeon earlier today.   He described sx to surgeon and was reportedly told to f/u with Dr. Rockey Situ. He has f/u set for this coming Tuesday, virtual. I insisted that he come for face to face OV> Pt reported that he would need to speak with wife since he cannot drive at this time and would let us know on monday.   Pt sx include "twitching of lungs" and sometimes causes coughing fit. He is currently on asthma and allergy medication. Had cxr yesterday that was stable per surgeon. He denies recent illness or fevers. o2 in clinic today mid 90s, VSS.   I spoke to Dr. Fletcher Anon DOD, who suggested that waiting till OV tuesday was appropriate. No new orders at this time.   I told pt to seek urgent medical care if he develops cxp, SOB or any other new/worsening sx.   Pt verbalized understanding and in agreement with POC>   Advised pt to call for any further questions or concerns.

## 2019-09-12 NOTE — Progress Notes (Signed)
[address]       [name]             [phone]     CARDIOTHORACIC SURGERY OFFICE NOTE  Referring Provider is Jeffrey Tucker, Jeffrey November, Tucker Primary Cardiologist is Jeffrey Tucker PCP is Jeffrey Tucker   HPI:  63 yo man underwent aortic root replacement and CABG x2 on 08/27/2019.  He did very well except the need for left thoracentesis which caused significant left chest pain on pulmonary reexpansion.  He and his wife remain quite concerned about this.  2 days prior, he had a coughing paroxysm and they are concerned this represented another problem with the lungs.  Otherwise he has been doing well.  He denies fevers or chills.    Current Outpatient Medications  Medication Sig Dispense Refill  . acetaminophen (TYLENOL) 325 MG tablet Take 2 tablets (650 mg total) by mouth every 6 (six) hours as needed for mild pain.    Marland Kitchen aspirin EC 325 MG EC tablet Take 1 tablet (325 mg total) by mouth daily. 30 tablet 0  . atorvastatin (LIPITOR) 20 MG tablet Take 1 tablet (20 mg total) by mouth daily at 6 PM. 30 tablet 1  . cetirizine (ZYRTEC) 10 MG tablet Take 10 mg by mouth daily.    . ferrous sulfate 325 (65 FE) MG tablet Take 1 tablet (325 mg total) by mouth daily with breakfast. For one month then stop. If develop constipation, may stop sooner  3  . ketoconazole (NIZORAL) 2 % shampoo Apply 1 application topically once a week.    . levothyroxine (SYNTHROID) 125 MCG tablet Take 1 tablet by mouth once daily (Patient taking differently: Take 125 mcg by mouth daily before breakfast. ) 90 tablet 3  . metoprolol tartrate (LOPRESSOR) 25 MG tablet Take 0.5 tablets (12.5 mg total) by mouth 2 (two) times daily. 30 tablet 1  . traMADol (ULTRAM) 50 MG tablet Take 1 tablet (50 mg total) by mouth every 4 (four) hours as needed for moderate pain. 28 tablet 0  . albuterol (VENTOLIN HFA) 108 (90 Base) MCG/ACT inhaler INHALE 2 PUFFS BY MOUTH THREE TIMES DAILY AS NEEDED FOR ASTHMA  FLARE (Patient not taking: No sig reported) 8.5 g 1  . Fluticasone-Salmeterol (ADVAIR) 100-50 MCG/DOSE AEPB Inhale 1 puff into the lungs 2 (two) times daily. (Patient not taking: Reported on 09/11/2019) 60 each 11   No current facility-administered medications for this visit.       Physical Exam:   BP 121/76   Pulse 79   Temp 97.8 F (36.6 C) (Skin)   Resp 20   Ht 5\' 6"  (1.676 m)   Wt 70.8 kg   SpO2 95% Comment: RA  BMI 25.18 kg/m   General:  NAD   Chest:   cta  CV:   rrr  Incisions:  Well-healed  Abdomen:  sntnd  Extremities:  Mild edema RLE (SVG harvest site well-healed)  Diagnostic Tests:  CXR showing small left pleural effusion; no pulmonary edema   Impression:  Doing well after aortic root replacement and CABG x 2  Plan:  F/u in 3-4 weeks Continue to walk daily F/u with Dr. Rockey Tucker  I spent in excess of 20 minutes during the conduct of this office consultation and >50% of this time involved direct face-to-face encounter with the patient for counseling and/or coordination of their care.  Level 2  10 minutes Level 3                 15 minutes Level 4                 25 minutes Level 5                 40 minutes Jeffrey Tucker Z. Orvan Seen, Tucker [phone] 09/12/2019 11:44 AM

## 2019-09-15 ENCOUNTER — Other Ambulatory Visit: Payer: Self-pay

## 2019-09-15 ENCOUNTER — Telehealth: Payer: Self-pay | Admitting: Cardiovascular Disease

## 2019-09-15 ENCOUNTER — Ambulatory Visit (INDEPENDENT_AMBULATORY_CARE_PROVIDER_SITE_OTHER): Payer: Self-pay | Admitting: Physician Assistant

## 2019-09-15 ENCOUNTER — Other Ambulatory Visit: Payer: Self-pay | Admitting: *Deleted

## 2019-09-15 ENCOUNTER — Telehealth: Payer: Self-pay | Admitting: Physician Assistant

## 2019-09-15 ENCOUNTER — Encounter: Payer: Self-pay | Admitting: Physician Assistant

## 2019-09-15 ENCOUNTER — Other Ambulatory Visit
Admission: RE | Admit: 2019-09-15 | Discharge: 2019-09-15 | Disposition: A | Discharge status: Home / Self Care | Payer: Self-pay | Source: Ambulatory Visit | Attending: Physician Assistant | Admitting: Physician Assistant

## 2019-09-15 VITALS — BP 120/70 | HR 77 | Temp 97.0°F | Ht 66.0 in | Wt 155.0 lb

## 2019-09-15 DIAGNOSIS — Z953 Presence of xenogenic heart valve: Secondary | ICD-10-CM

## 2019-09-15 DIAGNOSIS — Z9889 Other specified postprocedural states: Secondary | ICD-10-CM

## 2019-09-15 DIAGNOSIS — I77810 Thoracic aortic ectasia: Secondary | ICD-10-CM

## 2019-09-15 DIAGNOSIS — R05 Cough: Secondary | ICD-10-CM

## 2019-09-15 DIAGNOSIS — I351 Nonrheumatic aortic (valve) insufficiency: Secondary | ICD-10-CM

## 2019-09-15 DIAGNOSIS — Q231 Congenital insufficiency of aortic valve: Secondary | ICD-10-CM

## 2019-09-15 DIAGNOSIS — D62 Acute posthemorrhagic anemia: Secondary | ICD-10-CM

## 2019-09-15 DIAGNOSIS — R509 Fever, unspecified: Secondary | ICD-10-CM

## 2019-09-15 DIAGNOSIS — Z951 Presence of aortocoronary bypass graft: Secondary | ICD-10-CM

## 2019-09-15 DIAGNOSIS — Z20822 Contact with and (suspected) exposure to covid-19: Secondary | ICD-10-CM

## 2019-09-15 DIAGNOSIS — E785 Hyperlipidemia, unspecified: Secondary | ICD-10-CM

## 2019-09-15 DIAGNOSIS — R059 Cough, unspecified: Secondary | ICD-10-CM

## 2019-09-15 DIAGNOSIS — J45909 Unspecified asthma, uncomplicated: Secondary | ICD-10-CM

## 2019-09-15 DIAGNOSIS — D473 Essential (hemorrhagic) thrombocythemia: Secondary | ICD-10-CM

## 2019-09-15 DIAGNOSIS — D75839 Thrombocytosis, unspecified: Secondary | ICD-10-CM

## 2019-09-15 DIAGNOSIS — I2510 Atherosclerotic heart disease of native coronary artery without angina pectoris: Secondary | ICD-10-CM

## 2019-09-15 LAB — BASIC METABOLIC PANEL
Anion gap: 10 (ref 5–15)
BUN: 16 mg/dL (ref 8–23)
CO2: 25 mmol/L (ref 22–32)
Calcium: 8.7 mg/dL — ABNORMAL LOW (ref 8.9–10.3)
Chloride: 101 mmol/L (ref 98–111)
Creatinine, Ser: 0.87 mg/dL (ref 0.61–1.24)
GFR calc Af Amer: >60 mL/min (ref >60)
GFR calc non Af Amer: >60 mL/min (ref >60)
Glucose, Bld: 148 mg/dL — ABNORMAL HIGH (ref 70–99)
Potassium: 4.1 mmol/L (ref 3.5–5.1)
Sodium: 136 mmol/L (ref 135–145)

## 2019-09-15 LAB — CBC
HCT: 26.6 % — ABNORMAL LOW (ref 39.0–52.0)
Hemoglobin: 8.1 g/dL — ABNORMAL LOW (ref 13.0–17.0)
MCH: 28.2 pg (ref 26.0–34.0)
MCHC: 30.5 g/dL (ref 30.0–36.0)
MCV: 92.7 fL (ref 80.0–100.0)
Platelets: 629 10*3/uL — ABNORMAL HIGH (ref 150–400)
RBC: 2.87 MIL/uL — ABNORMAL LOW (ref 4.22–5.81)
RDW: 15 % (ref 11.5–15.5)
WBC: 7.7 10*3/uL (ref 4.0–10.5)
nRBC: 0 % (ref 0.0–0.2)

## 2019-09-15 MED ORDER — BENZONATATE 100 MG PO CAPS: 100.0000 mg | ORAL_CAPSULE | Freq: Three times a day (TID) | ORAL | 2 refills | Status: DC

## 2019-09-15 NOTE — Telephone Encounter (Signed)
Patient was seen this am by Thurmond Butts and wants him to know the lung spasms he discussed increase when legs elevated and decrease when down.

## 2019-09-15 NOTE — Telephone Encounter (Signed)
Please contact patient this morning.  In doing chart prep it appears he is scheduled for a virtual visit.  I was reviewing his recent MyChart message and telephone note indicating significant complaints of "lung twitching" which may be related to his surgery or completely unrelated.  He was also noted to have a downtrending hemoglobin at hospital discharge.  We will also need to reach out to CVTS for their input regarding his complaints as to if they are surgical in etiology.  The patient cannot be appropriately evaluated virtually.  Recommend that the patient come in for in person visit.

## 2019-09-15 NOTE — Telephone Encounter (Signed)
Call to patient to make sure he is coming in person, per request.   Pt agreeable to come into clinic. Labs requested stat [facility]. Pt agreeable.  Orders placed.

## 2019-09-15 NOTE — Patient Instructions (Signed)
Medication Instructions:  1- START Tessalon Take 1 capsule (100 mg total) by mouth 3 (three) times daily. 2- STOP Metoprolol  *If you need a refill on your cardiac medications before your next appointment, please call your pharmacy*  Lab Work: 1- Covid Testing Please drive to front on mail visitors, make sure you have drivers license If you have labs (blood work) drawn today and your tests are completely normal, you will receive your results only by: Marland Kitchen MyChart Message (if you have MyChart) OR . A paper copy in the mail If you have any lab test that is abnormal or we need to change your treatment, we will call you to review the results.  Testing/Procedures: None ordered   Follow-Up: At [facility], you and your health needs are our priority.  As part of our continuing mission to provide you with exceptional heart care, we have created designated Provider Care Teams.  These Care Teams include your primary Cardiologist (physician) and Advanced Practice Providers (APPs -  Physician Assistants and Nurse Practitioners) who all work together to provide you with the care you need, when you need it.  Your next appointment:   4 weeks  The format for your next appointment:   In Person  Provider:    You may see Ida Rogue, MD or one of the following Advanced Practice Providers on your designated Care Team:    Murray Hodgkins, NP  Christell Faith, PA-C  Marrianne Mood, PA-C

## 2019-09-16 ENCOUNTER — Emergency Department (HOSPITAL_COMMUNITY): Payer: PRIVATE HEALTH INSURANCE

## 2019-09-16 ENCOUNTER — Inpatient Hospital Stay (HOSPITAL_COMMUNITY)
Admission: EM | Admit: 2019-09-16 | Discharge: 2019-09-24 | DRG: 314 | Disposition: A | Discharge status: Home Health | Payer: PRIVATE HEALTH INSURANCE | Attending: Internal Medicine | Admitting: Internal Medicine

## 2019-09-16 ENCOUNTER — Other Ambulatory Visit: Payer: Self-pay

## 2019-09-16 ENCOUNTER — Telehealth: Payer: Self-pay | Admitting: Cardiovascular Disease

## 2019-09-16 DIAGNOSIS — R079 Chest pain, unspecified: Secondary | ICD-10-CM

## 2019-09-16 DIAGNOSIS — Z79899 Other long term (current) drug therapy: Secondary | ICD-10-CM

## 2019-09-16 DIAGNOSIS — Z952 Presence of prosthetic heart valve: Secondary | ICD-10-CM

## 2019-09-16 DIAGNOSIS — I970 Postcardiotomy syndrome: Secondary | ICD-10-CM | POA: Diagnosis present

## 2019-09-16 DIAGNOSIS — Z09 Encounter for follow-up examination after completed treatment for conditions other than malignant neoplasm: Secondary | ICD-10-CM

## 2019-09-16 DIAGNOSIS — Z87891 Personal history of nicotine dependence: Secondary | ICD-10-CM

## 2019-09-16 DIAGNOSIS — D62 Acute posthemorrhagic anemia: Secondary | ICD-10-CM | POA: Diagnosis present

## 2019-09-16 DIAGNOSIS — I110 Hypertensive heart disease with heart failure: Secondary | ICD-10-CM | POA: Diagnosis present

## 2019-09-16 DIAGNOSIS — Z8679 Personal history of other diseases of the circulatory system: Secondary | ICD-10-CM

## 2019-09-16 DIAGNOSIS — I480 Paroxysmal atrial fibrillation: Secondary | ICD-10-CM | POA: Diagnosis not present

## 2019-09-16 DIAGNOSIS — I25118 Atherosclerotic heart disease of native coronary artery with other forms of angina pectoris: Secondary | ICD-10-CM | POA: Diagnosis present

## 2019-09-16 DIAGNOSIS — Z20828 Contact with and (suspected) exposure to other viral communicable diseases: Secondary | ICD-10-CM | POA: Diagnosis present

## 2019-09-16 DIAGNOSIS — I959 Hypotension, unspecified: Secondary | ICD-10-CM | POA: Diagnosis not present

## 2019-09-16 DIAGNOSIS — I241 Dressler's syndrome: Secondary | ICD-10-CM | POA: Diagnosis present

## 2019-09-16 DIAGNOSIS — J90 Pleural effusion, not elsewhere classified: Secondary | ICD-10-CM

## 2019-09-16 DIAGNOSIS — E039 Hypothyroidism, unspecified: Secondary | ICD-10-CM | POA: Diagnosis present

## 2019-09-16 DIAGNOSIS — J189 Pneumonia, unspecified organism: Secondary | ICD-10-CM | POA: Diagnosis present

## 2019-09-16 DIAGNOSIS — D649 Anemia, unspecified: Secondary | ICD-10-CM

## 2019-09-16 DIAGNOSIS — N179 Acute kidney failure, unspecified: Secondary | ICD-10-CM | POA: Diagnosis present

## 2019-09-16 DIAGNOSIS — I272 Pulmonary hypertension, unspecified: Secondary | ICD-10-CM | POA: Diagnosis present

## 2019-09-16 DIAGNOSIS — I10 Essential (primary) hypertension: Secondary | ICD-10-CM | POA: Diagnosis present

## 2019-09-16 DIAGNOSIS — R0782 Intercostal pain: Secondary | ICD-10-CM

## 2019-09-16 DIAGNOSIS — I5021 Acute systolic (congestive) heart failure: Secondary | ICD-10-CM | POA: Diagnosis present

## 2019-09-16 DIAGNOSIS — I25708 Atherosclerosis of coronary artery bypass graft(s), unspecified, with other forms of angina pectoris: Secondary | ICD-10-CM | POA: Diagnosis present

## 2019-09-16 DIAGNOSIS — I5023 Acute on chronic systolic (congestive) heart failure: Secondary | ICD-10-CM | POA: Diagnosis present

## 2019-09-16 DIAGNOSIS — I4891 Unspecified atrial fibrillation: Secondary | ICD-10-CM

## 2019-09-16 DIAGNOSIS — E871 Hypo-osmolality and hyponatremia: Secondary | ICD-10-CM | POA: Diagnosis not present

## 2019-09-16 DIAGNOSIS — N400 Enlarged prostate without lower urinary tract symptoms: Secondary | ICD-10-CM | POA: Diagnosis present

## 2019-09-16 DIAGNOSIS — Z953 Presence of xenogenic heart valve: Secondary | ICD-10-CM | POA: Diagnosis not present

## 2019-09-16 DIAGNOSIS — I2510 Atherosclerotic heart disease of native coronary artery without angina pectoris: Secondary | ICD-10-CM

## 2019-09-16 DIAGNOSIS — Z951 Presence of aortocoronary bypass graft: Secondary | ICD-10-CM

## 2019-09-16 DIAGNOSIS — E860 Dehydration: Secondary | ICD-10-CM | POA: Diagnosis not present

## 2019-09-16 DIAGNOSIS — Z8042 Family history of malignant neoplasm of prostate: Secondary | ICD-10-CM | POA: Diagnosis not present

## 2019-09-16 DIAGNOSIS — M79609 Pain in unspecified limb: Secondary | ICD-10-CM

## 2019-09-16 DIAGNOSIS — M7989 Other specified soft tissue disorders: Secondary | ICD-10-CM | POA: Diagnosis present

## 2019-09-16 DIAGNOSIS — Z825 Family history of asthma and other chronic lower respiratory diseases: Secondary | ICD-10-CM | POA: Diagnosis not present

## 2019-09-16 DIAGNOSIS — R197 Diarrhea, unspecified: Secondary | ICD-10-CM | POA: Diagnosis not present

## 2019-09-16 DIAGNOSIS — Z7982 Long term (current) use of aspirin: Secondary | ICD-10-CM

## 2019-09-16 DIAGNOSIS — E785 Hyperlipidemia, unspecified: Secondary | ICD-10-CM | POA: Diagnosis present

## 2019-09-16 DIAGNOSIS — J4520 Mild intermittent asthma, uncomplicated: Secondary | ICD-10-CM | POA: Diagnosis present

## 2019-09-16 DIAGNOSIS — R5081 Fever presenting with conditions classified elsewhere: Secondary | ICD-10-CM

## 2019-09-16 DIAGNOSIS — Z95828 Presence of other vascular implants and grafts: Secondary | ICD-10-CM

## 2019-09-16 DIAGNOSIS — R748 Abnormal levels of other serum enzymes: Secondary | ICD-10-CM | POA: Diagnosis present

## 2019-09-16 DIAGNOSIS — Z79890 Hormone replacement therapy (postmenopausal): Secondary | ICD-10-CM

## 2019-09-16 LAB — COMPREHENSIVE METABOLIC PANEL
ALT: 16 U/L (ref 0–44)
AST: 26 U/L (ref 15–41)
Albumin: 2.8 g/dL — ABNORMAL LOW (ref 3.5–5.0)
Alkaline Phosphatase: 151 U/L — ABNORMAL HIGH (ref 38–126)
Anion gap: 13 (ref 5–15)
BUN: 14 mg/dL (ref 8–23)
CO2: 22 mmol/L (ref 22–32)
Calcium: 8.7 mg/dL — ABNORMAL LOW (ref 8.9–10.3)
Chloride: 101 mmol/L (ref 98–111)
Creatinine, Ser: 0.86 mg/dL (ref 0.61–1.24)
GFR calc Af Amer: >60 mL/min (ref >60)
GFR calc non Af Amer: >60 mL/min (ref >60)
Glucose, Bld: 162 mg/dL — ABNORMAL HIGH (ref 70–99)
Potassium: 4 mmol/L (ref 3.5–5.1)
Sodium: 136 mmol/L (ref 135–145)
Total Bilirubin: 0.6 mg/dL (ref 0.3–1.2)
Total Protein: 7.3 g/dL (ref 6.5–8.1)

## 2019-09-16 LAB — CBC WITH DIFFERENTIAL/PLATELET
Abs Immature Granulocytes: 0.07 10*3/uL (ref 0.00–0.07)
Basophils Absolute: 0.1 10*3/uL (ref 0.0–0.1)
Basophils Relative: 0 %
Eosinophils Absolute: 0.2 10*3/uL (ref 0.0–0.5)
Eosinophils Relative: 1 %
HCT: 27 % — ABNORMAL LOW (ref 39.0–52.0)
Hemoglobin: 8.1 g/dL — ABNORMAL LOW (ref 13.0–17.0)
Immature Granulocytes: 1 %
Lymphocytes Relative: 7 %
Lymphs Abs: 0.8 10*3/uL (ref 0.7–4.0)
MCH: 28.3 pg (ref 26.0–34.0)
MCHC: 30 g/dL (ref 30.0–36.0)
MCV: 94.4 fL (ref 80.0–100.0)
Monocytes Absolute: 1 10*3/uL (ref 0.1–1.0)
Monocytes Relative: 8 %
Neutro Abs: 10.9 10*3/uL — ABNORMAL HIGH (ref 1.7–7.7)
Neutrophils Relative %: 83 %
Platelets: 630 10*3/uL — ABNORMAL HIGH (ref 150–400)
RBC: 2.86 MIL/uL — ABNORMAL LOW (ref 4.22–5.81)
RDW: 14.9 % (ref 11.5–15.5)
WBC: 13 10*3/uL — ABNORMAL HIGH (ref 4.0–10.5)
nRBC: 0 % (ref 0.0–0.2)

## 2019-09-16 LAB — HIV ANTIBODY (ROUTINE TESTING W REFLEX): HIV Screen 4th Generation wRfx: NONREACTIVE

## 2019-09-16 LAB — BRAIN NATRIURETIC PEPTIDE: B Natriuretic Peptide: 451.3 pg/mL — ABNORMAL HIGH (ref 0.0–100.0)

## 2019-09-16 LAB — MAGNESIUM: Magnesium: 2.1 mg/dL (ref 1.7–2.4)

## 2019-09-16 LAB — TROPONIN I (HIGH SENSITIVITY)
Troponin I (High Sensitivity): 149 ng/L (ref <18)
Troponin I (High Sensitivity): 168 ng/L (ref <18)

## 2019-09-16 LAB — SARS CORONAVIRUS 2 (TAT 6-24 HRS): SARS Coronavirus 2: NEGATIVE

## 2019-09-16 LAB — C-REACTIVE PROTEIN: CRP: 16.4 mg/dL — ABNORMAL HIGH (ref <1.0)

## 2019-09-16 LAB — SEDIMENTATION RATE: Sed Rate: 89 mm/hr — ABNORMAL HIGH (ref 0–16)

## 2019-09-16 LAB — PHOSPHORUS: Phosphorus: 5.2 mg/dL — ABNORMAL HIGH (ref 2.5–4.6)

## 2019-09-16 LAB — TSH: TSH: 10.228 u[IU]/mL — ABNORMAL HIGH (ref 0.350–4.500)

## 2019-09-16 MED ORDER — ASPIRIN EC 325 MG PO TBEC
325.0000 mg | DELAYED_RELEASE_TABLET | Freq: Every day | ORAL | Status: DC
  Administered 2019-09-17 – 2019-09-18 (×2): 325 mg via ORAL
  Filled 2019-09-16 (×2): qty 1

## 2019-09-16 MED ORDER — ALBUTEROL SULFATE (2.5 MG/3ML) 0.083% IN NEBU
2.5000 mg | INHALATION_SOLUTION | RESPIRATORY_TRACT | Status: DC | PRN
  Administered 2019-09-17: 2.5 mg via RESPIRATORY_TRACT
  Filled 2019-09-16: qty 3

## 2019-09-16 MED ORDER — LEVOTHYROXINE SODIUM 25 MCG PO TABS
125.0000 ug | ORAL_TABLET | Freq: Every day | ORAL | Status: DC
  Administered 2019-09-17 – 2019-09-24 (×8): 125 ug via ORAL
  Filled 2019-09-16 (×8): qty 1

## 2019-09-16 MED ORDER — ATORVASTATIN CALCIUM 10 MG PO TABS
20.0000 mg | ORAL_TABLET | Freq: Every day | ORAL | Status: DC
  Administered 2019-09-16: 20 mg via ORAL
  Filled 2019-09-16: qty 2

## 2019-09-16 MED ORDER — POLYETHYLENE GLYCOL 3350 17 G PO PACK: 17.0000 g | PACK | Freq: Every day | ORAL | Status: DC | PRN

## 2019-09-16 MED ORDER — COLCHICINE 0.6 MG PO TABS
0.3000 mg | ORAL_TABLET | Freq: Two times a day (BID) | ORAL | Status: DC
  Administered 2019-09-16: 0.3 mg via ORAL
  Filled 2019-09-16 (×2): qty 0.5

## 2019-09-16 MED ORDER — ONDANSETRON HCL 4 MG PO TABS: 4.0000 mg | ORAL_TABLET | Freq: Four times a day (QID) | ORAL | Status: DC | PRN

## 2019-09-16 MED ORDER — ACETAMINOPHEN 325 MG PO TABS: 650.0000 mg | ORAL_TABLET | Freq: Four times a day (QID) | ORAL | Status: DC | PRN

## 2019-09-16 MED ORDER — TRAMADOL HCL 50 MG PO TABS
50.0000 mg | ORAL_TABLET | ORAL | Status: DC | PRN
  Administered 2019-09-16 – 2019-09-17 (×3): 50 mg via ORAL
  Filled 2019-09-16 (×3): qty 1

## 2019-09-16 MED ORDER — HYDROMORPHONE HCL 1 MG/ML IJ SOLN
0.5000 mg | Freq: Once | INTRAMUSCULAR | Status: AC
  Administered 2019-09-16: 0.5 mg via INTRAVENOUS
  Filled 2019-09-16: qty 1

## 2019-09-16 MED ORDER — ONDANSETRON HCL 4 MG/2ML IJ SOLN
4.0000 mg | Freq: Four times a day (QID) | INTRAMUSCULAR | Status: DC | PRN
  Administered 2019-09-17 – 2019-09-21 (×3): 4 mg via INTRAVENOUS
  Filled 2019-09-16 (×3): qty 2

## 2019-09-16 MED ORDER — MORPHINE SULFATE (PF) 2 MG/ML IV SOLN
2.0000 mg | INTRAVENOUS | Status: DC | PRN
  Administered 2019-09-17: 2 mg via INTRAVENOUS
  Filled 2019-09-16: qty 1

## 2019-09-16 MED ORDER — BENZONATATE 100 MG PO CAPS
100.0000 mg | ORAL_CAPSULE | Freq: Three times a day (TID) | ORAL | Status: DC
  Administered 2019-09-16 – 2019-09-23 (×22): 100 mg via ORAL
  Filled 2019-09-16 (×23): qty 1

## 2019-09-16 MED ORDER — ACETAMINOPHEN 650 MG RE SUPP: 650.0000 mg | Freq: Four times a day (QID) | RECTAL | Status: DC | PRN

## 2019-09-16 MED ORDER — FERROUS SULFATE 325 (65 FE) MG PO TABS
325.0000 mg | ORAL_TABLET | Freq: Every day | ORAL | Status: DC
  Administered 2019-09-17 – 2019-09-24 (×8): 325 mg via ORAL
  Filled 2019-09-16 (×8): qty 1

## 2019-09-16 MED ORDER — IOHEXOL 350 MG/ML SOLN
100.0000 mL | Freq: Once | INTRAVENOUS | Status: AC | PRN
  Administered 2019-09-16: 100 mL via INTRAVENOUS

## 2019-09-16 NOTE — ED Provider Notes (Signed)
[facility] EMERGENCY DEPARTMENT Provider Note   CSN: [id number] Arrival date & time: 09/16/19  1253     History   Chief Complaint Chief Complaint  Patient presents with   Chest Pain    HPI Jeffrey Tucker is a 63 y.o. male.     HPI Patient presents with chest pain.  He is around 3 weeks post CABG and aortic replacement and aortic root repair.  States this morning he got up and then ate breakfast and took medicines.  Little after that began to have more shortness of breath and mid chest pressure.  Some improvement nitroglycerin.  He is having lung spasms since then has had other pain but this is different the pain he been having.  No fevers.  States he is more short of breath than he had been.  States also his right lower has more swelling.  That is where his vein was harvested from.  Swelling had decreased and now increased again.  States he feels that this is heart pain.  Patient was reportedly very pale with the episode. Past Medical History:  Diagnosis Date   Allergy    allergic rhinitis   Aortic root dilatation (HCC)    a. s/p surgical repair 08/27/2019   Asthma    mostly in childhood   Bicuspid aortic valve    a.  Status post bioprosthetic replacement 08/27/2019   BPH (benign prostatic hypertrophy)    CAD (coronary artery disease)    a. s/p 2-v CABG 08/2019 (SVG-OM, SVG--rPDA)   GERD (gastroesophageal reflux disease)    History of kidney stones    Hypertension    Hypertriglyceridemia    Hypothyroidism    Severe aortic regurgitation    a. bicuspid aortic valve, b. s/p bio Bentall procedure 08/2019    Patient Active Problem List   Diagnosis Date Noted   S/P aortic valve replacement with bioprosthetic valve 08/27/2019   Seborrheic dermatitis of scalp 06/17/2019   Arthralgia 04/01/2019   Preventative health care 12/26/2017   Allergic asthma 09/09/2017   Essential hypertension, benign 02/28/2017   Advance care planning  09/30/2016   Mild intermittent asthma 08/21/2016   Hypertriglyceridemia    Benign prostatic hyperplasia    Ascending aorta dilatation (HCC) 08/21/2013   Aortic valve regurgitation 07/20/2011   Bicuspid aortic valve 07/02/2011   Mild intermittent asthma without complication 0000000   ALLERGIC RHINITIS 04/27/2009   Hypothyroidism 04/14/2008    Past Surgical History:  Procedure Laterality Date   BENTALL PROCEDURE N/A 08/27/2019   Procedure: BENTALL PROCEDURE with a 27 mm KONECT Resilia Aortic Valved Conduit and 30 mm x 30 cm Hemashield Platinum straight graft.;  Surgeon: Wonda Olds, MD;  Location: [facility] OR;  Service: Open Heart Surgery;  Laterality: N/A;   CARDIAC CATHETERIZATION  2006   [place]   CARDIOVASCULAR STRESS TEST  2006   [place]   CHOLECYSTECTOMY  2006   CORONARY ARTERY BYPASS GRAFT N/A 08/27/2019   Procedure: CORONARY ARTERY BYPASS GRAFTING (CABG) times 2 using right saphenous endoscopic vein harvesting to OM2 nad PDA.;  Surgeon: Wonda Olds, MD;  Location: [place];  Service: Open Heart Surgery;  Laterality: N/A;   CYSTOSCOPY W/ URETERAL STENT PLACEMENT  05/24/2017   Procedure: left;  Surgeon: Nickie Retort, MD;  Location: [facility];  Service: Urology;;   CYSTOSCOPY W/ URETERAL STENT PLACEMENT Left 06/11/2017   Procedure: CYSTOSCOPY WITH STENT REPLACEMENT;  Surgeon: Hollice Espy, MD;  Location: [facility];  Service: Urology;  Laterality:  Left;   CYSTOSCOPY WITH BIOPSY Left 06/11/2017   Procedure: RENAL PELVIS TUMOR WITH BIOPSY WITH POSSIBLE LASER ABLATION;  Surgeon: Hollice Espy, MD;  Location: [facility];  Service: Urology;  Laterality: Left;   CYSTOSCOPY WITH STENT PLACEMENT Left 05/24/2017   Procedure: , cystoscopy,left ureteral stent placement and left ureteroscopy attempted, retrograde pylogram;  Surgeon: Nickie Retort, MD;  Location: [facility];  Service: Urology;  Laterality: Left;   EYE SURGERY Bilateral    Cataract Extraction with  IOL   IR THORACENTESIS ASP PLEURAL SPACE W/IMG GUIDE  09/02/2019   RIGHT/LEFT HEART CATH AND CORONARY ANGIOGRAPHY N/A 08/21/2019   Procedure: RIGHT/LEFT HEART CATH AND CORONARY ANGIOGRAPHY;  Surgeon: Minna Merritts, MD;  Location: [place] [facility];  Service: Cardiovascular;  Laterality: N/A;   TEE WITHOUT CARDIOVERSION N/A 08/27/2019   Procedure: TRANSESOPHAGEAL ECHOCARDIOGRAM (TEE);  Surgeon: Wonda Olds, MD;  Location: [place];  Service: Open Heart Surgery;  Laterality: N/A;   URETEROSCOPY WITH HOLMIUM LASER LITHOTRIPSY Left 06/11/2017   Procedure: URETEROSCOPY WITH HOLMIUM LASER LITHOTRIPSY;  Surgeon: Hollice Espy, MD;  Location: [facility];  Service: Urology;  Laterality: Left;   VEIN LIGATION AND STRIPPING Bilateral 1997        Home Medications    Prior to Admission medications   Medication Sig Start Date End Date Taking? Authorizing Provider  acetaminophen (TYLENOL) 325 MG tablet Take 2 tablets (650 mg total) by mouth every 6 (six) hours as needed for mild pain. 09/02/19   Nani Skillern, PA-C  albuterol (VENTOLIN HFA) 108 (90 Base) MCG/ACT inhaler INHALE 2 PUFFS BY MOUTH THREE TIMES DAILY AS NEEDED FOR ASTHMA FLARE 03/30/19   Venia Carbon, MD  aspirin EC 325 MG EC tablet Take 1 tablet (325 mg total) by mouth daily. 09/01/19   Nani Skillern, PA-C  atorvastatin (LIPITOR) 20 MG tablet Take 1 tablet (20 mg total) by mouth daily at 6 PM. 09/02/19   Nani Skillern, PA-C  benzonatate (TESSALON PERLES) 100 MG capsule Take 1 capsule (100 mg total) by mouth 3 (three) times daily. 09/15/19   Rise Mu, PA-C  cetirizine (ZYRTEC) 10 MG tablet Take 10 mg by mouth daily.    [provider]  ferrous sulfate 325 (65 FE) MG tablet Take 1 tablet (325 mg total) by mouth daily with breakfast. For one month then stop. If develop constipation, may stop sooner 09/01/19   Nani Skillern, PA-C  Fluticasone-Salmeterol (ADVAIR) 100-50 MCG/DOSE AEPB Inhale 1  puff into the lungs 2 (two) times daily. 08/07/19   Venia Carbon, MD  guaiFENesin (MUCINEX) 600 MG 12 hr tablet Take by mouth 2 (two) times daily.    [provider]  ketoconazole (NIZORAL) 2 % shampoo Apply 1 application topically once a week. 09/02/19   Nani Skillern, PA-C  levothyroxine (SYNTHROID) 125 MCG tablet Take 1 tablet by mouth once daily Patient taking differently: Take 125 mcg by mouth daily before breakfast.  05/18/19   Venia Carbon, MD  polyethylene glycol (MIRALAX) 17 g packet Take 17 g by mouth daily as needed.    [provider]  traMADol (ULTRAM) 50 MG tablet Take 1 tablet (50 mg total) by mouth every 4 (four) hours as needed for moderate pain. 09/02/19   Nani Skillern, PA-C    Family History Family History  Problem Relation Age of Onset   Alcohol abuse Father    Prostate cancer Father    Alcohol abuse Mother  Asthma Mother    Liver cancer Paternal Grandmother    Colon cancer Neg Hx    Coronary artery disease Neg Hx    Hypertension Neg Hx    Diabetes Neg Hx    Bladder Cancer Neg Hx    Kidney cancer Neg Hx     Social History Social History   Tobacco Use   Smoking status: Former Smoker    Types: Cigarettes    Quit date: 11/26/1978    Years since quitting: 40.8   Smokeless tobacco: Never Used   Tobacco comment: social smoked till 1980's  Substance Use Topics   Alcohol use: No    Alcohol/week: 0.0 standard drinks    Comment: none since 1984   Drug use: No     Allergies   Tetracycline hcl and Amlodipine   Review of Systems Review of Systems  Constitutional: Negative for appetite change.  HENT: Negative for congestion.   Respiratory: Positive for shortness of breath.   Cardiovascular: Positive for chest pain and leg swelling.  Gastrointestinal: Negative for abdominal pain.  Genitourinary: Negative for flank pain.  Musculoskeletal: Negative for back pain.  Skin: Negative for rash.    Neurological: Negative for weakness.  Psychiatric/Behavioral: Negative for confusion.     Physical Exam Updated Vital Signs BP 116/89    Pulse (!) 104    Temp 98.9 F (37.2 C) (Oral)    Resp (!) 22    Ht 5\' 6"  (1.676 m)    Wt 68 kg    SpO2 96%    BMI 24.21 kg/m   Physical Exam Vitals signs and nursing note reviewed.  HENT:     Head: Normocephalic.  Cardiovascular:     Rate and Rhythm: Regular rhythm. Tachycardia present.  Pulmonary:     Effort: Tachypnea present.  Chest:     Comments: Right upper chest and midline incisions well-healing. Abdominal:     Palpations: There is no splenomegaly.     Tenderness: There is no abdominal tenderness.  Musculoskeletal:     Right lower leg: He exhibits tenderness.     Comments: Right lower leg with tenderness and some edema compared to contralateral.  Skin:    General: Skin is warm.     Capillary Refill: Capillary refill takes less than 2 seconds.  Neurological:     Mental Status: He is alert.      ED Treatments / Results  Labs (all labs ordered are listed, but only abnormal results are displayed) Labs Reviewed  COMPREHENSIVE METABOLIC PANEL - Abnormal; Notable for the following components:      Result Value   Glucose, Bld 162 (*)    Calcium 8.7 (*)    Albumin 2.8 (*)    Alkaline Phosphatase 151 (*)    All other components within normal limits  CBC WITH DIFFERENTIAL/PLATELET - Abnormal; Notable for the following components:   WBC 13.0 (*)    RBC 2.86 (*)    Hemoglobin 8.1 (*)    HCT 27.0 (*)    Platelets 630 (*)    Neutro Abs 10.9 (*)    All other components within normal limits  TROPONIN I (HIGH SENSITIVITY) - Abnormal; Notable for the following components:   Troponin I (High Sensitivity) 149 (*)    All other components within normal limits  TROPONIN I (HIGH SENSITIVITY)    EKG None  Radiology Ct Angio Chest Pe W And/or Wo Contrast  Result Date: 09/16/2019 CLINICAL DATA:  63 year old male with concern for  pulmonary embolism. EXAM:  CT ANGIOGRAPHY CHEST WITH CONTRAST TECHNIQUE: Multidetector CT imaging of the chest was performed using the standard protocol during bolus administration of intravenous contrast. Multiplanar CT image reconstructions and MIPs were obtained to evaluate the vascular anatomy. CONTRAST:  141mL OMNIPAQUE IOHEXOL 350 MG/ML SOLN COMPARISON:  Chest CT dated 09/02/2019 FINDINGS: Cardiovascular: Moderate cardiomegaly similar to prior CT. Aortic valve repair/replacement. There is retrograde flow of contrast from the right atrium into the IVC suggestive of a degree of right heart dysfunction. There has been slight interval increase in the amount of pericardial effusion and edema compared to prior CT. A 5.2 x 5.0 cm ill-defined collection anterior and inferior to the heart and posterior to sternotomy (series 5, image 74), likely postsurgical seroma. Attention on follow-up imaging recommended. There is mild atherosclerotic calcification of the aortic arch. No dissection. Similar appearance of low attenuating fluid surrounding the aortic root. No extravasation of contrast. Evaluation of the pulmonary arteries is limited due to respiratory motion artifact and streak artifact caused by patient's arms. There is a small nonocclusive intraluminal hypodensity in the left upper lobe branch (series 6, image 81, sagittal series 11 image 105, and coronal series 10, image 82). This most likely represent an area of scarring and appears to have been present on the prior CT of 09/02/2019 (series 6, image 51). No definite acute large or central pulmonary artery embolus identified. Mediastinum/Nodes: No hilar adenopathy. The esophagus is grossly unremarkable. Postsurgical changes and edema in the anterior mediastinum. Top-normal mediastinal lymph nodes measure up to 11 mm in short axis in the prevascular space, likely reactive. Lungs/Pleura: There is a small left pleural effusion with partial consolidative changes of the  left lower lobe which may represent atelectasis versus infiltrate. Overall improved aeration of the left lower lobe as well as left upper lobe compared to the prior CT. Decreased in the right lower lobe consolidative change since the prior CT with improved aeration of the right lung. There is a trace right pleural effusion significantly decreased in size since the prior CT. Upper Abdomen: There is a 6 mm nonobstructing left renal interpolar calculus. Cholecystectomy. There is a 5.5 cm right renal cyst. Musculoskeletal: Median sternotomy wires. No acute osseous pathology. Review of the MIP images confirms the above findings. IMPRESSION: 1. No definite CT evidence of central pulmonary artery embolus. Small nonocclusive intraluminal hypodensity in the left upper lobe branch most likely represents an area of scarring. 2. Moderate cardiomegaly with evidence of right heart dysfunction. 3. Slight interval increase in the amount of pericardial effusion and edema compared to the prior CT. 4. Overall improved aeration of the lungs with decrease in the consolidative changes of the lower lobes compared to the prior CT. Significant interval decrease in the size of the right pleural effusion. 5. Aortic Atherosclerosis (ICD10-I70.0). Electronically Signed   By: Anner Crete M.D.   On: 09/16/2019 14:39   Dg Chest Portable 1 View  Result Date: 09/16/2019 CLINICAL DATA:  Status post aortic valve replacement on 10/01 number shortness of breath with chest pressure following breakfast EXAM: PORTABLE CHEST 1 VIEW COMPARISON:  09/10/2019 FINDINGS: No frank interstitial edema. Small left pleural effusion. Associated left lower lobe opacity, likely atelectasis. Possible trace right pleural effusion. No pneumothorax. Cardiomegaly.  Prosthetic aortic valve. Surgical clips along the right lateral chest wall/axilla. Median sternotomy. IMPRESSION: Small left pleural effusion. Associated left lower lobe opacity, likely atelectasis.  Electronically Signed   By: Julian Hy M.D.   On: 09/16/2019 14:16    Procedures Procedures (including  critical care time)  Medications Ordered in ED Medications  HYDROmorphone (DILAUDID) injection 0.5 mg (0.5 mg Intravenous Given 09/16/19 1406)  iohexol (OMNIPAQUE) 350 MG/ML injection 100 mL (100 mLs Intravenous Contrast Given 09/16/19 1411)     Initial Impression / Assessment and Plan / ED Course  I have reviewed the triage vital signs and the nursing notes.  Pertinent labs & imaging results that were available during my care of the patient were reviewed by me and considered in my medical decision making (see chart for details).        Patient with chest pain.  Recent CABG.  Has had some pain issues but states this is different.  EKG reassuring.  Troponin mildly elevated but unsure how clinically useful this is with the recent coronary surgery CT scan shows no definite PE but one questionable area likely scarring.  Also has mildly increased pericardial effusion and mediastinal seroma. Will get Doppler to evaluate increased swelling of the right leg. Will have patient seen by cardiology.  Care turned over to Dr. Kathrynn Humble  Final Clinical Impressions(s) / ED Diagnoses   Final diagnoses:  Nonspecific chest pain    ED Discharge Orders    None       Davonna Belling, MD 09/16/19 1531

## 2019-09-16 NOTE — ED Notes (Signed)
Report attempted to floor.   

## 2019-09-16 NOTE — Telephone Encounter (Signed)
Pt reports substernal chest pressure over the past 30 min after taking medications. He took tramadol and pain is easing off per his report. He says cough is improving and denies SOB.   Pt reports pain is 1/10 currently but when walking and moving pt is moaning and audible SOB noted. He is able to speak in full sentences at this time.   Vitals  0800 130/76, HR 88, o2 sat 96% temp 97.2F Currently 113/87, HR 108 (after walking to get cuff) o2 sat 97, 98.2F  Pt was seen in clinic yesterday and had reported fever 102 in the past 24 hours.   I advised pt to be seen in ED for current sx. He said he would call his wife for transportation to [place] (pt preferred ED).   Advised pt to call for any further questions or concerns.   Made provider Christell Faith, PA aware. No further advice at this time.

## 2019-09-16 NOTE — H&P (Addendum)
History and Physical    Jeffrey Tucker [id number] DOB: [birth year] DOA: 09/16/2019  PCP: Venia Carbon, MD  Patient coming from: Home I have personally briefly reviewed patient's old medical records in [place]  Chief Complaint: Chest discomfort, shortness of breath, leg swelling and cough  HPI: Jeffrey Tucker is a 63 y.o. male with medical history significant of coronary artery disease status post CABG x2, bicuspid aortic valve with severe valvular regurgitation status post Bentall procedure, pulmonary hypertension, former smoker, asthma, hypertension, hyperlipidemia, hypothyroidism, BPH presents to emergency department due to chest discomfort, weakness, shortness of breath, leg swelling and a dry cough.    Patient had dental procedure with bio prosthetic aortic valve on 08/27/2019.  Patient reports since her discharge from the hospital he is not doing well.  Has chest discomfort, exertional shortness of breath, leg swelling, dry cough and chills.  He took sublingual nitro with some improvement.  Reports leg swelling is associated with color change in his both lower extremities.  No headache, blurry vision, recent COVID-19 exposure, has nausea however denies vomiting, diarrhea, epigastric pain, numbness weakness tingling sensation.  Wife at bedside reports patient has stomach spasm which causes cough.  Cough is dry, not associated with sore throat, runny nose, chest congestion, wheezing.  He has history of asthma and has been using albuterol and Advair however unable to inhale it completely due to chest pain.  ED Course: Upon arrival: Patient was tachycardic, troponin elevated at 149.  EKG no acute findings.  CT angiogram negative for pulmonary embolism but with increased pericardial infusion.  EDP consulted cardiology and cardiothoracic surgery for further evaluation and management.  Review of Systems: As per HPI otherwise negative.    Past Medical History:  Diagnosis Date    Allergy    allergic rhinitis   Aortic root dilatation (HCC)    a. s/p surgical repair 08/27/2019   Asthma    mostly in childhood   Bicuspid aortic valve    a.  Status post bioprosthetic replacement 08/27/2019   BPH (benign prostatic hypertrophy)    CAD (coronary artery disease)    a. s/p 2-v CABG 08/2019 (SVG-OM, SVG--rPDA)   GERD (gastroesophageal reflux disease)    History of kidney stones    Hypertension    Hypertriglyceridemia    Hypothyroidism    Severe aortic regurgitation    a. bicuspid aortic valve, b. s/p bio Bentall procedure 08/2019    Past Surgical History:  Procedure Laterality Date   BENTALL PROCEDURE N/A 08/27/2019   Procedure: BENTALL PROCEDURE with a 27 mm KONECT Resilia Aortic Valved Conduit and 30 mm x 30 cm Hemashield Platinum straight graft.;  Surgeon: Wonda Olds, MD;  Location: [place];  Service: Open Heart Surgery;  Laterality: N/A;   CARDIAC CATHETERIZATION  2006   [place]   CARDIOVASCULAR STRESS TEST  2006   [place]   CHOLECYSTECTOMY  2006   CORONARY ARTERY BYPASS GRAFT N/A 08/27/2019   Procedure: CORONARY ARTERY BYPASS GRAFTING (CABG) times 2 using right saphenous endoscopic vein harvesting to OM2 nad PDA.;  Surgeon: Wonda Olds, MD;  Location: [place];  Service: Open Heart Surgery;  Laterality: N/A;   CYSTOSCOPY W/ URETERAL STENT PLACEMENT  05/24/2017   Procedure: left;  Surgeon: Nickie Retort, MD;  Location: [facility];  Service: Urology;;   CYSTOSCOPY W/ URETERAL STENT PLACEMENT Left 06/11/2017   Procedure: CYSTOSCOPY WITH STENT REPLACEMENT;  Surgeon: Hollice Espy, MD;  Location: [facility];  Service: Urology;  Laterality:  Left;   CYSTOSCOPY WITH BIOPSY Left 06/11/2017   Procedure: RENAL PELVIS TUMOR WITH BIOPSY WITH POSSIBLE LASER ABLATION;  Surgeon: Hollice Espy, MD;  Location: [facility];  Service: Urology;  Laterality: Left;   CYSTOSCOPY WITH STENT PLACEMENT Left 05/24/2017   Procedure: , cystoscopy,left ureteral stent  placement and left ureteroscopy attempted, retrograde pylogram;  Surgeon: Nickie Retort, MD;  Location: [facility];  Service: Urology;  Laterality: Left;   EYE SURGERY Bilateral    Cataract Extraction with IOL   IR THORACENTESIS ASP PLEURAL SPACE W/IMG GUIDE  09/02/2019   RIGHT/LEFT HEART CATH AND CORONARY ANGIOGRAPHY N/A 08/21/2019   Procedure: RIGHT/LEFT HEART CATH AND CORONARY ANGIOGRAPHY;  Surgeon: Minna Merritts, MD;  Location: [place] [facility];  Service: Cardiovascular;  Laterality: N/A;   TEE WITHOUT CARDIOVERSION N/A 08/27/2019   Procedure: TRANSESOPHAGEAL ECHOCARDIOGRAM (TEE);  Surgeon: Wonda Olds, MD;  Location: [place];  Service: Open Heart Surgery;  Laterality: N/A;   URETEROSCOPY WITH HOLMIUM LASER LITHOTRIPSY Left 06/11/2017   Procedure: URETEROSCOPY WITH HOLMIUM LASER LITHOTRIPSY;  Surgeon: Hollice Espy, MD;  Location: [facility];  Service: Urology;  Laterality: Left;   VEIN LIGATION AND STRIPPING Bilateral 1997     reports that he quit smoking about 40 years ago. His smoking use included cigarettes. He has never used smokeless tobacco. He reports that he does not drink alcohol or use drugs.  Allergies  Allergen Reactions   Tetracycline Hcl Other (See Comments)    Joint pain (and stiffened them, also)   Amlodipine Swelling    Lower extremities and body became swollen; can tolerate 5 mg doses, however    Family History  Problem Relation Age of Onset   Alcohol abuse Father    Prostate cancer Father    Alcohol abuse Mother    Asthma Mother    Liver cancer Paternal Grandmother    Colon cancer Neg Hx    Coronary artery disease Neg Hx    Hypertension Neg Hx    Diabetes Neg Hx    Bladder Cancer Neg Hx    Kidney cancer Neg Hx     Prior to Admission medications   Medication Sig Start Date End Date Taking? Authorizing Provider  acetaminophen (TYLENOL) 325 MG tablet Take 2 tablets (650 mg total) by mouth every 6 (six) hours as needed for  mild pain. 09/02/19  Yes Tacy Dura, Donielle M, PA-C  albuterol (VENTOLIN HFA) 108 (90 Base) MCG/ACT inhaler INHALE 2 PUFFS BY MOUTH THREE TIMES DAILY AS NEEDED FOR ASTHMA FLARE Patient taking differently: Inhale 2 puffs into the lungs as needed for wheezing or shortness of breath.  03/30/19  Yes Viviana Simpler I, MD  aspirin EC 325 MG EC tablet Take 1 tablet (325 mg total) by mouth daily. 09/01/19  Yes Lars Pinks M, PA-C  atorvastatin (LIPITOR) 20 MG tablet Take 1 tablet (20 mg total) by mouth daily at 6 PM. 09/02/19  Yes Lars Pinks M, PA-C  benzonatate (TESSALON PERLES) 100 MG capsule Take 1 capsule (100 mg total) by mouth 3 (three) times daily. 09/15/19  Yes Dunn, Areta Haber, PA-C  cetirizine (ZYRTEC) 10 MG tablet Take 10 mg by mouth daily.   Yes [provider]  ferrous sulfate 325 (65 FE) MG tablet Take 1 tablet (325 mg total) by mouth daily with breakfast. For one month then stop. If develop constipation, may stop sooner 09/01/19  Yes Tacy Dura, Donielle M, PA-C  Fluticasone-Salmeterol (ADVAIR) 100-50 MCG/DOSE AEPB Inhale 1 puff into the lungs 2 (  two) times daily. 08/07/19  Yes Viviana Simpler I, MD  Glucosamine 500 MG CAPS Take 500 mg by mouth daily.   Yes [provider]  guaiFENesin (MUCINEX) 600 MG 12 hr tablet Take 600 mg by mouth 2 (two) times daily.    Yes [provider]  levothyroxine (SYNTHROID) 125 MCG tablet Take 1 tablet by mouth once daily Patient taking differently: Take 125 mcg by mouth daily before breakfast.  05/18/19  Yes Venia Carbon, MD  polyethylene glycol (MIRALAX) 17 g packet Take 17 g by mouth daily as needed.   Yes [provider]  traMADol (ULTRAM) 50 MG tablet Take 1 tablet (50 mg total) by mouth every 4 (four) hours as needed for moderate pain. 09/02/19  Yes Lars Pinks M, PA-C  ketoconazole (NIZORAL) 2 % shampoo Apply 1 application topically once a week. Patient not taking: Reported on 09/16/2019 09/02/19    Nani Skillern, [place]    Physical Exam: Vitals:   09/16/19 1630 09/16/19 1715 09/16/19 1730 09/16/19 1745  BP: 100/85 101/87 104/85 112/89  Pulse: (!) 125     Resp: (!) 21 (!) 27 (!) 30 (!) 22  Temp:      TempSrc:      SpO2: 94%     Weight:      Height:        Constitutional: NAD, calm, comfortable Vitals:   09/16/19 1630 09/16/19 1715 09/16/19 1730 09/16/19 1745  BP: 100/85 101/87 104/85 112/89  Pulse: (!) 125     Resp: (!) 21 (!) 27 (!) 30 (!) 22  Temp:      TempSrc:      SpO2: 94%     Weight:      Height:       Constitutional: Alert and oriented x4, sitting on the side of the bed, maintaining oxygen saturation on room air, not in acute distress.   Eyes: PERRL, lids and conjunctivae normal ENMT: Mucous membranes are moist. Posterior pharynx clear of any exudate or lesions.Normal dentition.  Neck: normal, supple, no masses, no thyromegaly Respiratory: clear to auscultation bilaterally, no wheezing, no crackles. Normal respiratory effort. No accessory muscle use.  Cardiovascular: Regular rate and rhythm, no murmurs / rubs / gallops.  Bilateral 2+ pitting edema positive.R>L. 2+ pedal pulses. No carotid bruits.  Abdomen: no tenderness, no masses palpated. No hepatosplenomegaly. Bowel sounds positive.  Musculoskeletal: no clubbing / cyanosis. No joint deformity upper and lower extremities. Good ROM, no contractures. Normal muscle tone.  Skin:    Neurologic: CN 2-12 grossly intact. Sensation intact, DTR normal. Strength 5/5 in all 4.  Psychiatric: Normal judgment and insight. Alert and oriented x 3. Normal mood.    Labs on Admission: I have personally reviewed following labs and imaging studies  CBC: Recent Labs  Lab 09/15/19 0911 09/16/19 1336  WBC 7.7 13.0*  NEUTROABS  --  10.9*  HGB 8.1* 8.1*  HCT 26.6* 27.0*  MCV 92.7 94.4  PLT 629* [id number]*   Basic Metabolic Panel: Recent Labs  Lab 09/15/19 0911 09/16/19 1336  NA 136 136  K 4.1 4.0  CL 101 101  CO2  25 22  GLUCOSE 148* 162*  BUN 16 14  CREATININE 0.87 0.86  CALCIUM 8.7* 8.7*   GFR: Estimated Creatinine Clearance: 79.3 mL/min (by C-G formula based on SCr of 0.86 mg/dL). Liver Function Tests: Recent Labs  Lab 09/16/19 1336  AST 26  ALT 16  ALKPHOS 151*  BILITOT 0.6  PROT 7.3  ALBUMIN 2.8*  No results for input(s): LIPASE, AMYLASE in the last 168 hours. No results for input(s): AMMONIA in the last 168 hours. Coagulation Profile: No results for input(s): INR, PROTIME in the last 168 hours. Cardiac Enzymes: No results for input(s): CKTOTAL, CKMB, CKMBINDEX, TROPONINI in the last 168 hours. BNP (last 3 results) No results for input(s): PROBNP in the last 8760 hours. HbA1C: No results for input(s): HGBA1C in the last 72 hours. CBG: No results for input(s): GLUCAP in the last 168 hours. Lipid Profile: No results for input(s): CHOL, HDL, LDLCALC, TRIG, CHOLHDL, LDLDIRECT in the last 72 hours. Thyroid Function Tests: No results for input(s): TSH, T4TOTAL, FREET4, T3FREE, THYROIDAB in the last 72 hours. Anemia Panel: No results for input(s): VITAMINB12, FOLATE, FERRITIN, TIBC, IRON, RETICCTPCT in the last 72 hours. Urine analysis:    Component Value Date/Time   COLORURINE YELLOW 08/25/2019 0902   APPEARANCEUR CLEAR 08/25/2019 0902   APPEARANCEUR Cloudy (A) 06/19/2017 0828   LABSPEC 1.021 08/25/2019 0902   PHURINE 5.0 08/25/2019 0902   GLUCOSEU NEGATIVE 08/25/2019 0902   HGBUR NEGATIVE 08/25/2019 0902   BILIRUBINUR NEGATIVE 08/25/2019 0902   BILIRUBINUR Negative 06/19/2017 0828   KETONESUR NEGATIVE 08/25/2019 0902   PROTEINUR NEGATIVE 08/25/2019 0902   NITRITE NEGATIVE 08/25/2019 0902   LEUKOCYTESUR NEGATIVE 08/25/2019 0902    Radiological Exams on Admission: Ct Angio Chest Pe W And/or Wo Contrast  Result Date: 09/16/2019 CLINICAL DATA:  63 year old male with concern for pulmonary embolism. EXAM: CT ANGIOGRAPHY CHEST WITH CONTRAST TECHNIQUE: Multidetector CT  imaging of the chest was performed using the standard protocol during bolus administration of intravenous contrast. Multiplanar CT image reconstructions and MIPs were obtained to evaluate the vascular anatomy. CONTRAST:  175mL OMNIPAQUE IOHEXOL 350 MG/ML SOLN COMPARISON:  Chest CT dated 09/02/2019 FINDINGS: Cardiovascular: Moderate cardiomegaly similar to prior CT. Aortic valve repair/replacement. There is retrograde flow of contrast from the right atrium into the IVC suggestive of a degree of right heart dysfunction. There has been slight interval increase in the amount of pericardial effusion and edema compared to prior CT. A 5.2 x 5.0 cm ill-defined collection anterior and inferior to the heart and posterior to sternotomy (series 5, image 74), likely postsurgical seroma. Attention on follow-up imaging recommended. There is mild atherosclerotic calcification of the aortic arch. No dissection. Similar appearance of low attenuating fluid surrounding the aortic root. No extravasation of contrast. Evaluation of the pulmonary arteries is limited due to respiratory motion artifact and streak artifact caused by patient's arms. There is a small nonocclusive intraluminal hypodensity in the left upper lobe branch (series 6, image 81, sagittal series 11 image 105, and coronal series 10, image 82). This most likely represent an area of scarring and appears to have been present on the prior CT of 09/02/2019 (series 6, image 51). No definite acute large or central pulmonary artery embolus identified. Mediastinum/Nodes: No hilar adenopathy. The esophagus is grossly unremarkable. Postsurgical changes and edema in the anterior mediastinum. Top-normal mediastinal lymph nodes measure up to 11 mm in short axis in the prevascular space, likely reactive. Lungs/Pleura: There is a small left pleural effusion with partial consolidative changes of the left lower lobe which may represent atelectasis versus infiltrate. Overall improved  aeration of the left lower lobe as well as left upper lobe compared to the prior CT. Decreased in the right lower lobe consolidative change since the prior CT with improved aeration of the right lung. There is a trace right pleural effusion significantly decreased in size since the  prior CT. Upper Abdomen: There is a 6 mm nonobstructing left renal interpolar calculus. Cholecystectomy. There is a 5.5 cm right renal cyst. Musculoskeletal: Median sternotomy wires. No acute osseous pathology. Review of the MIP images confirms the above findings. IMPRESSION: 1. No definite CT evidence of central pulmonary artery embolus. Small nonocclusive intraluminal hypodensity in the left upper lobe branch most likely represents an area of scarring. 2. Moderate cardiomegaly with evidence of right heart dysfunction. 3. Slight interval increase in the amount of pericardial effusion and edema compared to the prior CT. 4. Overall improved aeration of the lungs with decrease in the consolidative changes of the lower lobes compared to the prior CT. Significant interval decrease in the size of the right pleural effusion. 5. Aortic Atherosclerosis (ICD10-I70.0). Electronically Signed   By: Anner Crete M.D.   On: 09/16/2019 14:39   Dg Chest Portable 1 View  Result Date: 09/16/2019 CLINICAL DATA:  Status post aortic valve replacement on 10/01 number shortness of breath with chest pressure following breakfast EXAM: PORTABLE CHEST 1 VIEW COMPARISON:  09/10/2019 FINDINGS: No frank interstitial edema. Small left pleural effusion. Associated left lower lobe opacity, likely atelectasis. Possible trace right pleural effusion. No pneumothorax. Cardiomegaly.  Prosthetic aortic valve. Surgical clips along the right lateral chest wall/axilla. Median sternotomy. IMPRESSION: Small left pleural effusion. Associated left lower lobe opacity, likely atelectasis. Electronically Signed   By: Julian Hy M.D.   On: 09/16/2019 14:16   Vas [place]  Lower Extremity Venous (dvt) (only [facility])  Result Date: 09/16/2019  Lower Venous Study Indications: Pain, Swelling, and Recent CABG and aortic root replacement 08/27/19.  Risk Factors: Right GSV harvest. Limitations: Pants on, unable to remove pants secondary to chest pain. Comparison Study: No prior study Performing Technologist: Sharion Dove RVS  Examination Guidelines: A complete evaluation includes B-mode imaging, spectral Doppler, color Doppler, and power Doppler as needed of all accessible portions of each vessel. Bilateral testing is considered an integral part of a complete examination. Limited examinations for reoccurring indications may be performed as noted.  +---------+---------------+---------+-----------+----------+--------------+  RIGHT     Compressibility Phasicity Spontaneity Properties Thrombus Aging  +---------+---------------+---------+-----------+----------+--------------+  CFV                                                        Not visualized  +---------+---------------+---------+-----------+----------+--------------+  SFJ                                                        Not visualized  +---------+---------------+---------+-----------+----------+--------------+  FV Prox   Full                                             pulsatile       +---------+---------------+---------+-----------+----------+--------------+  FV Mid    Full                                                             +---------+---------------+---------+-----------+----------+--------------+  FV Distal Full                                                             +---------+---------------+---------+-----------+----------+--------------+  PFV       Full                                                             +---------+---------------+---------+-----------+----------+--------------+  POP       Full                                             pulsatile        +---------+---------------+---------+-----------+----------+--------------+  PTV       Full                                                             +---------+---------------+---------+-----------+----------+--------------+  PERO      Full                                                             +---------+---------------+---------+-----------+----------+--------------+   Left Technical Findings: Left leg not evaluated.   Summary: Right: There is no evidence of deep vein thrombosis in the lower extremity. However, portions of this examination were limited- see technologist comments above.  *See table(s) above for measurements and observations.    Preliminary     EKG: Sinus tachycardia with LVH.  No ST elevation or depression noted.  Assessment/Plan Principal Problem:   Post cardiotomy syndrome Active Problems:   Hypothyroidism   Mild intermittent asthma   Essential hypertension, benign   S/P aortic valve replacement with bioprosthetic valve   Chest pain: -Associated with shortness of breath, leg swelling, weakness. -Patient is status post CABG and AVR on 08/27/2019 -His symptoms could be secondary to post cardiotomy syndrome versus infection? -Will rule out infection-order blood culture, CRP, sed rate, COVID-19 is pending. -CT angiogram is negative for pulmonary embolism.  Initial troponin elevated at 149-likely secondary to tachycardia and recent CABG.  EKG: Reassuring. -Admit patient to stepdown unit for close monitoring. -EDP consulted cardiology and cardiothoracic surgery-appreciate help. -Transthoracic echo is ordered- and is pending. -Patient has been started on colchicine 0.3 mg twice daily.  Hypertension: Stable. -His beta-blocker was stopped in the clinic yesterday for the concern of asthma exacerbation. -Monitor his blood pressure closely.  Anemia: Likely secondary to postop.  H&H is stable. -Monitor H&H closely.  No signs of active bleeding. -Continue iron  supplements.  Bilateral lower extremity edema: R>L -Doppler ultrasound came back negative for DVT. -Echo is pending.  Ordered BNP. -Strict INO's and daily weight.  Coronary artery disease status post CABG: -Troponin initially elevated.  Will trend-likely due to tachycardia and a recent CABG.  EKG no acute changes -Morphine as needed for severe pain.  Continue aspirin and statin.  Severe valvular regurgitation: Status post Bentall procedure on 08/27/2019. -Aware.  Cardiothoracic surgery has been consulted-await recommendation.  Pericardial effusion: -Is noted on CT angiogram. -Strict INO's and daily weight.  Could be secondary to Dressler syndrome? -Ordered echo and is pending.  Check TSH.  Mild intermittent asthma: Not in exacerbation -No wheezing, patient is maintaining oxygen saturation on room air. -Continue albuterol breathing treatment.  Continue Tessalon Perle for cough  Hypothyroidism: Check TSH -Continue levothyroxine  DVT prophylaxis: TED/SCD Code Status: Full code  family Communication: Wife present at bedside.  Plan of care discussed with patient and his wife in length and they verbalized understanding and agreed with it. Disposition Plan: To be determined Consults called: Cardiology and cardiothoracic surgery by EDP  admission status: Inpatient at the stepdown unit.   Mckinley Jewel MD [facility] Pager [phone]  If 7PM-7AM, please contact night-coverage [email or web address] Password [email or web address]  09/16/2019, 6:09 PM

## 2019-09-16 NOTE — Telephone Encounter (Signed)
Pt c/o of Chest Pain: STAT if CP now or developed within 24 hours  1. Are you having CP right now? Yes, slight pressure  2. Are you experiencing any other symptoms (ex. SOB, nausea, vomiting, sweating)? Hot flash. This happened after he took his medication that dr Rockey Situ gave him yesterday  3. How long have you been experiencing CP? 30 minutes  4. Is your CP continuous or coming and going?continual  5. Have you taken Nitroglycerin? No, but took a pain medication.  ?

## 2019-09-16 NOTE — ED Triage Notes (Addendum)
Pt had open heart surgery on 10/1, aortic and valve replacement. This morning pt ate breakfast and took medications, and one minute later pt became short of breath with central chest pressure. Pt given 3 nitrol and 324 asa by EMS PTA with improvement to 1/10. Pt has hx of asthma, and since his surgery on 10/1, he has been having intermittent "lung spasms," which are present today as well.

## 2019-09-16 NOTE — Progress Notes (Signed)
VASCULAR LAB PRELIMINARY  PRELIMINARY  PRELIMINARY  PRELIMINARY  Right lower extremity venous duplex completed.    Preliminary report:  See CV proc for preliminary results.  Gave Dr. Alvino Chapel report  Mauro Kaufmann, Nicklas Mcsweeney, RVT 09/16/2019, 4:11 PM

## 2019-09-16 NOTE — Consult Note (Addendum)
The patient has been seen in conjunction with Fabian Sharp, PAC. All aspects of care have been considered and discussed. The patient has been personally interviewed, examined, and all clinical data has been reviewed.   Bentall procedure with coronary bypass surgery x2 by Dr. Percival Spanish.  Since discharge she has had chest discomfort and swelling.  More recently has been running low-grade fever at night which was 102.1 F 36 hours ago.  No fever currently.  Currently complaining of nausea, vomiting, and difficulty finding a comfortable position.  Abdominal spasms which then leads to chest discomfort.  Feels better sitting up rather than lying down.  No pericardial or pleural friction rub is heard.  Decreased breath sounds on the left side.  ECG demonstrates LVH with secondary ST abnormality.  No injury current.  High-sensitivity troponin I is mildly elevated as might be expected following relatively recent heart surgery.  CLINICAL IMPRESSION: Possible post cardiotomy syndrome.  Rule out infection and therefore consider blood cultures, may need left effusion tap and pan culture, symptomatic control of nausea and vomiting.  Start colchicine 0.6 mg twice daily; obtain inflammatory markers (hs-CRP; ESR); repeat echocardiogram; notify [facility] of admission; COVID-19 rule out; check BNP.       Cardiology Consult Note:   Patient ID: DADRIAN Tucker MRN: [id number]; DOB: 23-Mar-1956   Admission date: 09/16/2019  Primary Care Provider: Venia Carbon, MD Primary Cardiologist: Ida Rogue, MD  Primary Electrophysiologist:  None   Chief Complaint:  Chest pain  Patient Profile:   Jeffrey Tucker is a 63 y.o. male with PMH significant for CAD s/p CABG x 2 (SVG-OM, SVG-PDA 08/27/19), AAA, bicuspid aortic valve with severe valvular regrugitation s/p bentall procedure, pulmonary hypertension, former smoker, asthma, HTN, HLD, hypothyroidism, and BPH. He presented to [facility] with CP relieved with  nitro x 3.  Cardiology was asked to consult for chest pain by Dr. Kathrynn Humble.  History of Present Illness:   [name] was first diagnosed with bicuspid aortic valve in 2012 with normal EF. He has been periodically monitored with progression of his disease on echo 05/2019. He was also found ot have AAA and heart cath with two vessel disease. He underwent CABG x 2 (SVG-OM, SVG-PDA), Bentall  Procedure with bioprosthetic aortic valve, and distal hemi-arch reconstruction on 08/27/19. He required 2 U PRBC following procedure for blood loss anemia, IV diuresis, and bilateral pleural effusions. Hb at discharge was 7.8, PLT 174. He was seen by CT surgery in follow up on 09/11/19 and was doing well, but noted a slight incrased left pleural effusion. He sent a MyChart message reporting "lung twitching" for which he was seen in follow up by Christell Faith PAC on 09/15/19. He reported involuntary abdominal spasms followed by "cyclic dry coughing associated with significant chest discomfort." He also reported a Tmax of 102 on 09/14/19 and subsequent change in the taste to food.  This morning, he reports waking up to eat breakfast and take his AM medications. Shortly thereafter, he became SOB with chest pressure. He took nitro SL with some improvement in CP. He presented to [facility] for further evaluation. HS troponin was 149, although given his recent CABG, unclear if this is clinically significant. CTA negative for PE, but with increased pericardial effusion and mediastinal seroma. Of note, CT chest did not show ground glass opacities. He also reports increased swelling in right lower extremity - also limb where vein was harvested. EKG is reassuring.   Wife is at bedside and helps with history.  Spasms start in his stomach and radiate to both lungs. Pain is worse with deep inspiration and he reports SOB. During these episodes, pt is coughing and gasping for air. Pt wife questions his underlying asthma. Lopressor stopped on 09/15/19  and tessalon pearls given.These spasm episodes happen randomly, but frequently occur at night. Pt has been forced to sleep in a recliner due to these episodes. His left arm is now very sore. Right leg is also "very tight and hard."   He and his wife have been quarantined at home since surgery. Yesterday, fever was 99.8. Wife applied ice to his right lower extremity and gave tylenol prior to arrival.   Heart Pathway Score:     Past Medical History:  Diagnosis Date   Allergy    allergic rhinitis   Aortic root dilatation (HCC)    a. s/p surgical repair 08/27/2019   Asthma    mostly in childhood   Bicuspid aortic valve    a.  Status post bioprosthetic replacement 08/27/2019   BPH (benign prostatic hypertrophy)    CAD (coronary artery disease)    a. s/p 2-v CABG 08/2019 (SVG-OM, SVG--rPDA)   GERD (gastroesophageal reflux disease)    History of kidney stones    Hypertension    Hypertriglyceridemia    Hypothyroidism    Severe aortic regurgitation    a. bicuspid aortic valve, b. s/p bio Bentall procedure 08/2019    Past Surgical History:  Procedure Laterality Date   BENTALL PROCEDURE N/A 08/27/2019   Procedure: BENTALL PROCEDURE with a 27 mm KONECT Resilia Aortic Valved Conduit and 30 mm x 30 cm Hemashield Platinum straight graft.;  Surgeon: Wonda Olds, MD;  Location: [place];  Service: Open Heart Surgery;  Laterality: N/A;   CARDIAC CATHETERIZATION  2006   [place]   CARDIOVASCULAR STRESS TEST  2006   [place]   CHOLECYSTECTOMY  2006   CORONARY ARTERY BYPASS GRAFT N/A 08/27/2019   Procedure: CORONARY ARTERY BYPASS GRAFTING (CABG) times 2 using right saphenous endoscopic vein harvesting to OM2 nad PDA.;  Surgeon: Wonda Olds, MD;  Location: [place];  Service: Open Heart Surgery;  Laterality: N/A;   CYSTOSCOPY W/ URETERAL STENT PLACEMENT  05/24/2017   Procedure: left;  Surgeon: Nickie Retort, MD;  Location: [facility];  Service: Urology;;   CYSTOSCOPY W/  URETERAL STENT PLACEMENT Left 06/11/2017   Procedure: CYSTOSCOPY WITH STENT REPLACEMENT;  Surgeon: Hollice Espy, MD;  Location: [facility];  Service: Urology;  Laterality: Left;   CYSTOSCOPY WITH BIOPSY Left 06/11/2017   Procedure: RENAL PELVIS TUMOR WITH BIOPSY WITH POSSIBLE LASER ABLATION;  Surgeon: Hollice Espy, MD;  Location: [facility];  Service: Urology;  Laterality: Left;   CYSTOSCOPY WITH STENT PLACEMENT Left 05/24/2017   Procedure: , cystoscopy,left ureteral stent placement and left ureteroscopy attempted, retrograde pylogram;  Surgeon: Nickie Retort, MD;  Location: [facility];  Service: Urology;  Laterality: Left;   EYE SURGERY Bilateral    Cataract Extraction with IOL   IR THORACENTESIS ASP PLEURAL SPACE W/IMG GUIDE  09/02/2019   RIGHT/LEFT HEART CATH AND CORONARY ANGIOGRAPHY N/A 08/21/2019   Procedure: RIGHT/LEFT HEART CATH AND CORONARY ANGIOGRAPHY;  Surgeon: Minna Merritts, MD;  Location: [place] [facility];  Service: Cardiovascular;  Laterality: N/A;   TEE WITHOUT CARDIOVERSION N/A 08/27/2019   Procedure: TRANSESOPHAGEAL ECHOCARDIOGRAM (TEE);  Surgeon: Wonda Olds, MD;  Location: [place];  Service: Open Heart Surgery;  Laterality: N/A;   URETEROSCOPY WITH HOLMIUM LASER LITHOTRIPSY Left 06/11/2017  Procedure: URETEROSCOPY WITH HOLMIUM LASER LITHOTRIPSY;  Surgeon: Hollice Espy, MD;  Location: [facility];  Service: Urology;  Laterality: Left;   VEIN LIGATION AND STRIPPING Bilateral 1997     Medications Prior to Admission: Prior to Admission medications   Medication Sig Start Date End Date Taking? Authorizing Provider  acetaminophen (TYLENOL) 325 MG tablet Take 2 tablets (650 mg total) by mouth every 6 (six) hours as needed for mild pain. 09/02/19   Nani Skillern, PA-C  albuterol (VENTOLIN HFA) 108 (90 Base) MCG/ACT inhaler INHALE 2 PUFFS BY MOUTH THREE TIMES DAILY AS NEEDED FOR ASTHMA FLARE 03/30/19   Venia Carbon, MD  aspirin EC 325 MG EC tablet Take 1  tablet (325 mg total) by mouth daily. 09/01/19   Nani Skillern, PA-C  atorvastatin (LIPITOR) 20 MG tablet Take 1 tablet (20 mg total) by mouth daily at 6 PM. 09/02/19   Nani Skillern, PA-C  benzonatate (TESSALON PERLES) 100 MG capsule Take 1 capsule (100 mg total) by mouth 3 (three) times daily. 09/15/19   Rise Mu, PA-C  cetirizine (ZYRTEC) 10 MG tablet Take 10 mg by mouth daily.    [provider]  ferrous sulfate 325 (65 FE) MG tablet Take 1 tablet (325 mg total) by mouth daily with breakfast. For one month then stop. If develop constipation, may stop sooner 09/01/19   Nani Skillern, PA-C  Fluticasone-Salmeterol (ADVAIR) 100-50 MCG/DOSE AEPB Inhale 1 puff into the lungs 2 (two) times daily. 08/07/19   Venia Carbon, MD  guaiFENesin (MUCINEX) 600 MG 12 hr tablet Take by mouth 2 (two) times daily.    [provider]  ketoconazole (NIZORAL) 2 % shampoo Apply 1 application topically once a week. 09/02/19   Nani Skillern, PA-C  levothyroxine (SYNTHROID) 125 MCG tablet Take 1 tablet by mouth once daily Patient taking differently: Take 125 mcg by mouth daily before breakfast.  05/18/19   Venia Carbon, MD  polyethylene glycol (MIRALAX) 17 g packet Take 17 g by mouth daily as needed.    [provider]  traMADol (ULTRAM) 50 MG tablet Take 1 tablet (50 mg total) by mouth every 4 (four) hours as needed for moderate pain. 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[employer]"    Comment: laid off   Occupation: Adult nurse estate  Social Designer, fashion/clothing strain: Not on file   Food insecurity    Worry: Not on file    Inability: Not on file   Transportation needs    Medical: Not on file    Non-medical: Not on file  Tobacco Use   Smoking status: Former Smoker    Types: Cigarettes    Quit date: 11/26/1978    Years since quitting: 40.8   Smokeless tobacco: Never Used   Tobacco comment: social smoked till 1980's  Substance and Sexual Activity   Alcohol use: No    Alcohol/week: 0.0 standard drinks    Comment: none since 1984   Drug use: No   Sexual activity: Not on file  Lifestyle   Physical activity    Days per week: Not on file    Minutes per session: Not on file   Stress: Not on file  Relationships   Social connections    Talks on phone: Not on file    Gets together: Not on file    Attends religious service: Not on file    Active member of club or organization: Not on file    Attends meetings of clubs or organizations: Not on file    Relationship status: Not on file   Intimate partner violence    Fear of current or ex partner: Not on file    Emotionally abused: Not on file    Physically abused: Not on file    Forced sexual activity: Not on file  Other Topics Concern   Not on file  Social History Narrative   Not on file    Family History:   The patient's family history includes Alcohol abuse in his father and mother; Asthma in his mother; Liver cancer in his paternal grandmother; Prostate cancer in his father. There is no history of Colon cancer, Coronary artery disease, Hypertension, Diabetes, Bladder Cancer, or Kidney cancer.    ROS:  Please see the history of present illness.  All other ROS reviewed and negative.     Physical Exam/Data:   Vitals:   09/16/19 1330 09/16/19 1337 09/16/19 1400 09/16/19 1510  BP: (!) 110/91  (!) 110/91 106/84 116/89  Pulse: (!) 109 (!) 110 (!) 109 (!) 104  Resp: (!) 22 (!) 26 (!) 26 (!) 22  Temp:      TempSrc:      SpO2: 97% 97% 97% 96%  Weight:      Height:       No intake or output data in the 24 hours ending 09/16/19 1525 Last 3 Weights 09/16/2019 09/15/2019 09/11/2019  Weight (lbs) 150 lb 155 lb 156 lb  Weight (kg) 68.04 kg 70.308 kg 70.761 kg     Body mass index is 24.21 kg/m.  General:  Well nourished, well developed, in no acute distress Lungs:  Respirations unlabored  Neuro:  CNs 2-12 intact, no focal abnormalities noted Psych:  Normal affect    EKG:  The ECG that was done was personally reviewed and demonstrates sinus tachycardia with HR 109  Relevant CV Studies:  Right LE duplex 09/16/19: Negative for DVT  Laboratory Data:  High Sensitivity Troponin:   Recent Labs  Lab 09/16/19 1336  TROPONINIHS 149*      Chemistry Recent Labs  Lab 09/15/19 0911 09/16/19 1336  NA 136 136  K 4.1 4.0  CL 101 101  CO2 25 22  GLUCOSE 148* 162*  BUN 16 14  CREATININE 0.87 0.86  CALCIUM 8.7* 8.7*  GFRNONAA >60 >60  GFRAA >60 >60  ANIONGAP 10 13    Recent Labs  Lab 09/16/19 1336  PROT 7.3  ALBUMIN 2.8*  AST 26  ALT 16  ALKPHOS 151*  BILITOT 0.6   Hematology Recent Labs  Lab 09/15/19 0911 09/16/19 1336  WBC 7.7 13.0*  RBC 2.87* 2.86*  HGB 8.1* 8.1*  HCT 26.6* 27.0*  MCV 92.7 94.4  MCH 28.2 28.3  MCHC 30.5 30.0  RDW 15.0 14.9  PLT 629* 630*   BNPNo results  for input(s): BNP, PROBNP in the last 168 hours.  DDimer No results for input(s): DDIMER in the last 168 hours.   Radiology/Studies:  Ct Angio Chest Pe W And/or Wo Contrast  Result Date: 09/16/2019 CLINICAL DATA:  63 year old male with concern for pulmonary embolism. EXAM: CT ANGIOGRAPHY CHEST WITH CONTRAST TECHNIQUE: Multidetector CT imaging of the chest was performed using the standard protocol during bolus administration of intravenous contrast. Multiplanar CT image  reconstructions and MIPs were obtained to evaluate the vascular anatomy. CONTRAST:  151m OMNIPAQUE IOHEXOL 350 MG/ML SOLN COMPARISON:  Chest CT dated 09/02/2019 FINDINGS: Cardiovascular: Moderate cardiomegaly similar to prior CT. Aortic valve repair/replacement. There is retrograde flow of contrast from the right atrium into the IVC suggestive of a degree of right heart dysfunction. There has been slight interval increase in the amount of pericardial effusion and edema compared to prior CT. A 5.2 x 5.0 cm ill-defined collection anterior and inferior to the heart and posterior to sternotomy (series 5, image 74), likely postsurgical seroma. Attention on follow-up imaging recommended. There is mild atherosclerotic calcification of the aortic arch. No dissection. Similar appearance of low attenuating fluid surrounding the aortic root. No extravasation of contrast. Evaluation of the pulmonary arteries is limited due to respiratory motion artifact and streak artifact caused by patient's arms. There is a small nonocclusive intraluminal hypodensity in the left upper lobe branch (series 6, image 81, sagittal series 11 image 105, and coronal series 10, image 82). This most likely represent an area of scarring and appears to have been present on the prior CT of 09/02/2019 (series 6, image 51). No definite acute large or central pulmonary artery embolus identified. Mediastinum/Nodes: No hilar adenopathy. The esophagus is grossly unremarkable. Postsurgical changes and edema in the anterior mediastinum. Top-normal mediastinal lymph nodes measure up to 11 mm in short axis in the prevascular space, likely reactive. Lungs/Pleura: There is a small left pleural effusion with partial consolidative changes of the left lower lobe which may represent atelectasis versus infiltrate. Overall improved aeration of the left lower lobe as well as left upper lobe compared to the prior CT. Decreased in the right lower lobe consolidative change  since the prior CT with improved aeration of the right lung. There is a trace right pleural effusion significantly decreased in size since the prior CT. Upper Abdomen: There is a 6 mm nonobstructing left renal interpolar calculus. Cholecystectomy. There is a 5.5 cm right renal cyst. Musculoskeletal: Median sternotomy wires. No acute osseous pathology. Review of the MIP images confirms the above findings. IMPRESSION: 1. No definite CT evidence of central pulmonary artery embolus. Small nonocclusive intraluminal hypodensity in the left upper lobe branch most likely represents an area of scarring. 2. Moderate cardiomegaly with evidence of right heart dysfunction. 3. Slight interval increase in the amount of pericardial effusion and edema compared to the prior CT. 4. Overall improved aeration of the lungs with decrease in the consolidative changes of the lower lobes compared to the prior CT. Significant interval decrease in the size of the right pleural effusion. 5. Aortic Atherosclerosis (ICD10-I70.0). Electronically Signed   By: AAnner CreteM.D.   On: 09/16/2019 14:39   Dg Chest Portable 1 View  Result Date: 09/16/2019 CLINICAL DATA:  Status post aortic valve replacement on 10/01 number shortness of breath with chest pressure following breakfast EXAM: PORTABLE CHEST 1 VIEW COMPARISON:  09/10/2019 FINDINGS: No frank interstitial edema. Small left pleural effusion. Associated left lower lobe opacity, likely atelectasis. Possible trace right pleural effusion. No  pneumothorax. Cardiomegaly.  Prosthetic aortic valve. Surgical clips along the right lateral chest wall/axilla. Median sternotomy. IMPRESSION: Small left pleural effusion. Associated left lower lobe opacity, likely atelectasis. Electronically Signed   By: Julian Hy M.D.   On: 09/16/2019 14:16    Assessment and Plan:   1. Chest pain, lung spasms 2. S/P CABG x 2, AAA repair, and AVR on 08/27/19 - CTA negative for PE - pt describes atypical  chest pain that starts as spasms in his abdomen and then radiates to bilateral lungs, associated with SOB, cough, and arm pain - lung pain is worse with deep inspiration, consistent with pleuritic chest pain - hs troponin 149, delta pending - EKG nonischemic - continue 325 mg ASA, lipitor - will order echo to evaluate left and right heart function as well as pericardial effusion - should rule out dressler's syndrome - will order colchicine  0.3 mg BID - will also order CRP and sed rate   3. Hypertension - BB stopped yesterday at clinic visit for concern for asthma exacerbation - he is not hypertensive so far this admission   4. Post-op anemia - Hb 8.1, no signs of active bleeding - continue iron supplements   5. Right lower extremity swelling - SVG harvest site - pt reports swelling had resolved since surgery - right lower extremity duplex negative for DVT - DDX include cellulitis, which would explain fever and mild leukocytosis - has been off lasix   6. Fever with Tmax 102 on 09/14/19 - accompanied by a change in the way food tastes - he and his wife have been quarantined since surgery - OP COVID test ordered, not resulted - will order COVID test in ER - recommend blood cultures by IM   7. Evidence of right heart dysfunction on CT - moderate cardiomegaly - slight increase in pericardial effusion - will obtain echocardiogram     For questions or updates, please contact [place] Please consult [email or web address] for contact info under        Signed, Ledora Bottcher, PA  09/16/2019 3:25 PM

## 2019-09-17 ENCOUNTER — Inpatient Hospital Stay (HOSPITAL_COMMUNITY): Payer: PRIVATE HEALTH INSURANCE

## 2019-09-17 ENCOUNTER — Encounter (HOSPITAL_COMMUNITY): Payer: Self-pay | Admitting: *Deleted

## 2019-09-17 DIAGNOSIS — I970 Postcardiotomy syndrome: Secondary | ICD-10-CM

## 2019-09-17 DIAGNOSIS — I361 Nonrheumatic tricuspid (valve) insufficiency: Secondary | ICD-10-CM

## 2019-09-17 DIAGNOSIS — M7989 Other specified soft tissue disorders: Secondary | ICD-10-CM

## 2019-09-17 DIAGNOSIS — R0602 Shortness of breath: Secondary | ICD-10-CM

## 2019-09-17 DIAGNOSIS — I10 Essential (primary) hypertension: Secondary | ICD-10-CM

## 2019-09-17 DIAGNOSIS — J4520 Mild intermittent asthma, uncomplicated: Secondary | ICD-10-CM

## 2019-09-17 DIAGNOSIS — Z953 Presence of xenogenic heart valve: Secondary | ICD-10-CM

## 2019-09-17 DIAGNOSIS — R609 Edema, unspecified: Secondary | ICD-10-CM

## 2019-09-17 DIAGNOSIS — R079 Chest pain, unspecified: Secondary | ICD-10-CM

## 2019-09-17 LAB — COMPREHENSIVE METABOLIC PANEL
ALT: 21 U/L (ref 0–44)
AST: 28 U/L (ref 15–41)
Albumin: 2.7 g/dL — ABNORMAL LOW (ref 3.5–5.0)
Alkaline Phosphatase: 157 U/L — ABNORMAL HIGH (ref 38–126)
Anion gap: 12 (ref 5–15)
BUN: 17 mg/dL (ref 8–23)
CO2: 21 mmol/L — ABNORMAL LOW (ref 22–32)
Calcium: 8.6 mg/dL — ABNORMAL LOW (ref 8.9–10.3)
Chloride: 100 mmol/L (ref 98–111)
Creatinine, Ser: 0.92 mg/dL (ref 0.61–1.24)
GFR calc Af Amer: >60 mL/min (ref >60)
GFR calc non Af Amer: >60 mL/min (ref >60)
Glucose, Bld: 160 mg/dL — ABNORMAL HIGH (ref 70–99)
Potassium: 4.9 mmol/L (ref 3.5–5.1)
Sodium: 133 mmol/L — ABNORMAL LOW (ref 135–145)
Total Bilirubin: 0.5 mg/dL (ref 0.3–1.2)
Total Protein: 7.3 g/dL (ref 6.5–8.1)

## 2019-09-17 LAB — CREATININE, SERUM
Creatinine, Ser: 1.15 mg/dL (ref 0.61–1.24)
GFR calc Af Amer: >60 mL/min (ref >60)
GFR calc non Af Amer: >60 mL/min (ref >60)

## 2019-09-17 LAB — NOVEL CORONAVIRUS, NAA: SARS-CoV-2, NAA: NOT DETECTED

## 2019-09-17 LAB — CBC
HCT: 25.1 % — ABNORMAL LOW (ref 39.0–52.0)
HCT: 24.2 % — ABNORMAL LOW (ref 39.0–52.0)
Hemoglobin: 7.8 g/dL — ABNORMAL LOW (ref 13.0–17.0)
Hemoglobin: 7.6 g/dL — ABNORMAL LOW (ref 13.0–17.0)
MCH: 28.1 pg (ref 26.0–34.0)
MCH: 28.5 pg (ref 26.0–34.0)
MCHC: 31.1 g/dL (ref 30.0–36.0)
MCHC: 31.4 g/dL (ref 30.0–36.0)
MCV: 90.3 fL (ref 80.0–100.0)
MCV: 90.6 fL (ref 80.0–100.0)
Platelets: 648 K/uL — ABNORMAL HIGH (ref 150–400)
Platelets: 689 10*3/uL — ABNORMAL HIGH (ref 150–400)
RBC: 2.78 MIL/uL — ABNORMAL LOW (ref 4.22–5.81)
RBC: 2.67 MIL/uL — ABNORMAL LOW (ref 4.22–5.81)
RDW: 15 % (ref 11.5–15.5)
RDW: 15.2 % (ref 11.5–15.5)
WBC: 11.2 K/uL — ABNORMAL HIGH (ref 4.0–10.5)
WBC: 14 10*3/uL — ABNORMAL HIGH (ref 4.0–10.5)
nRBC: 0 % (ref 0.0–0.2)
nRBC: 0 % (ref 0.0–0.2)

## 2019-09-17 LAB — RETICULOCYTES
Immature Retic Fract: 17.3 % — ABNORMAL HIGH (ref 2.3–15.9)
RBC.: 2.67 MIL/uL — ABNORMAL LOW (ref 4.22–5.81)
Retic Count, Absolute: 69.7 10*3/uL (ref 19.0–186.0)
Retic Ct Pct: 2.6 % (ref 0.4–3.1)

## 2019-09-17 LAB — IRON AND TIBC
Iron: 11 ug/dL — ABNORMAL LOW (ref 45–182)
Saturation Ratios: 4 % — ABNORMAL LOW (ref 17.9–39.5)
TIBC: 252 ug/dL (ref 250–450)
UIBC: 241 ug/dL

## 2019-09-17 LAB — VITAMIN B12: Vitamin B-12: 317 pg/mL (ref 180–914)

## 2019-09-17 LAB — FERRITIN: Ferritin: 501 ng/mL — ABNORMAL HIGH (ref 24–336)

## 2019-09-17 LAB — ECHOCARDIOGRAM COMPLETE
Height: 66 in
Weight: 2452.8 oz

## 2019-09-17 LAB — FOLATE: Folate: 8.8 ng/mL (ref >5.9)

## 2019-09-17 MED ORDER — IPRATROPIUM-ALBUTEROL 0.5-2.5 (3) MG/3ML IN SOLN
3.0000 mL | Freq: Four times a day (QID) | RESPIRATORY_TRACT | Status: DC
  Administered 2019-09-17 – 2019-09-18 (×2): 3 mL via RESPIRATORY_TRACT
  Filled 2019-09-17 (×3): qty 3

## 2019-09-17 MED ORDER — TRAMADOL HCL 50 MG PO TABS
50.0000 mg | ORAL_TABLET | Freq: Three times a day (TID) | ORAL | Status: DC | PRN
  Administered 2019-09-19 – 2019-09-20 (×2): 50 mg via ORAL
  Filled 2019-09-17 (×2): qty 1

## 2019-09-17 MED ORDER — BUDESONIDE 0.5 MG/2ML IN SUSP
0.5000 mg | Freq: Two times a day (BID) | RESPIRATORY_TRACT | Status: DC
  Administered 2019-09-17 – 2019-09-24 (×14): 0.5 mg via RESPIRATORY_TRACT
  Filled 2019-09-17 (×15): qty 2

## 2019-09-17 MED ORDER — FLUTICASONE FUROATE-VILANTEROL 100-25 MCG/INH IN AEPB
1.0000 | INHALATION_SPRAY | Freq: Every day | RESPIRATORY_TRACT | Status: DC
  Administered 2019-09-17: 1 via RESPIRATORY_TRACT
  Filled 2019-09-17: qty 28

## 2019-09-17 MED ORDER — PANTOPRAZOLE SODIUM 40 MG PO TBEC
40.0000 mg | DELAYED_RELEASE_TABLET | Freq: Every day | ORAL | Status: DC
  Administered 2019-09-17 – 2019-09-18 (×2): 40 mg via ORAL
  Filled 2019-09-17 (×2): qty 1

## 2019-09-17 MED ORDER — SODIUM CHLORIDE 0.9 % IV SOLN
2.0000 g | INTRAVENOUS | Status: AC
  Administered 2019-09-17 – 2019-09-23 (×7): 2 g via INTRAVENOUS
  Filled 2019-09-17: qty 20
  Filled 2019-09-17 (×2): qty 2
  Filled 2019-09-17: qty 20
  Filled 2019-09-17 (×3): qty 2

## 2019-09-17 MED ORDER — COLCHICINE 0.6 MG PO TABS
0.6000 mg | ORAL_TABLET | Freq: Two times a day (BID) | ORAL | Status: DC
  Administered 2019-09-17 – 2019-09-24 (×15): 0.6 mg via ORAL
  Filled 2019-09-17 (×15): qty 1

## 2019-09-17 MED ORDER — FUROSEMIDE 10 MG/ML IJ SOLN
40.0000 mg | Freq: Once | INTRAMUSCULAR | Status: AC
  Administered 2019-09-17: 40 mg via INTRAVENOUS
  Filled 2019-09-17: qty 4

## 2019-09-17 MED ORDER — ENOXAPARIN SODIUM 40 MG/0.4ML ~~LOC~~ SOLN
40.0000 mg | SUBCUTANEOUS | Status: DC
  Administered 2019-09-17 – 2019-09-18 (×2): 40 mg via SUBCUTANEOUS
  Filled 2019-09-17 (×2): qty 0.4

## 2019-09-17 MED ORDER — LORAZEPAM 2 MG/ML IJ SOLN
0.5000 mg | Freq: Four times a day (QID) | INTRAMUSCULAR | Status: DC | PRN
  Administered 2019-09-17 – 2019-09-20 (×7): 0.5 mg via INTRAVENOUS
  Filled 2019-09-17 (×7): qty 1

## 2019-09-17 MED ORDER — ATORVASTATIN CALCIUM 80 MG PO TABS
80.0000 mg | ORAL_TABLET | Freq: Every day | ORAL | Status: DC
  Administered 2019-09-17 – 2019-09-23 (×7): 80 mg via ORAL
  Filled 2019-09-17 (×7): qty 1

## 2019-09-17 MED ORDER — AZITHROMYCIN 250 MG PO TABS
250.0000 mg | ORAL_TABLET | Freq: Every day | ORAL | Status: AC
  Administered 2019-09-18 – 2019-09-21 (×4): 250 mg via ORAL
  Filled 2019-09-17 (×4): qty 1

## 2019-09-17 MED ORDER — IPRATROPIUM-ALBUTEROL 0.5-2.5 (3) MG/3ML IN SOLN: 3.0000 mL | RESPIRATORY_TRACT | Status: DC | PRN

## 2019-09-17 MED ORDER — AZITHROMYCIN 500 MG PO TABS
500.0000 mg | ORAL_TABLET | Freq: Every day | ORAL | Status: AC
  Administered 2019-09-17: 500 mg via ORAL
  Filled 2019-09-17: qty 1

## 2019-09-17 NOTE — Progress Notes (Signed)
Lower extremity venous has been completed.   Preliminary results in CV Proc.   Abram Sander 09/17/2019 9:58 AM

## 2019-09-17 NOTE — Evaluation (Addendum)
Physical Therapy Evaluation & Discharge Patient Details Name: Jeffrey Tucker MRN: [id number] DOB: 19-Jul-1956 Today's Date: 09/17/2019   History of Present Illness  Pt is a 63 y.o. male with recent CABGx2 and prosthetic valve replacement (08/2019), now admitted 09/16/19 with worsening DOE, chest discomfort and LE swelling. CTA negative for PE, but with increase pericardial effusion. Possible post-cardiotomy syndrome vs infection.  Other PMH includes HTN, asthma, CAD.    Clinical Impression  Patient evaluated by Physical Therapy with no further acute PT needs identified. PTA, pt has been independent with ambulation and wife assists with ADLs as needed since recent cardiac sx 08/27/2019. Today, pt indep with ambulation; anxious regarding chest spasms. Educ re: energy conservation, activity recommendations and cervical/shoulder ROM. All education has been completed and the patient has no further questions. Acute PT is signing off. Thank you for this referral.  HR 106-120s Post-ambulation BP 97/63    Follow Up Recommendations No PT follow up;Supervision - Intermittent    Equipment Recommendations  None recommended by PT    Recommendations for Other Services       Precautions / Restrictions Precautions Precautions: Sternal Restrictions Weight Bearing Restrictions: No      Mobility  Bed Mobility               General bed mobility comments: Received sitting in recliner  Transfers Overall transfer level: Independent Equipment used: None                Ambulation/Gait Ambulation/Gait assistance: Independent Social research officer, government (Feet): 150 Feet Assistive device: None;IV Pole Gait Pattern/deviations: Step-through pattern;Decreased stride length Gait velocity: Decreased Gait velocity interpretation: 1.31 - 2.62 ft/sec, indicative of limited community ambulator General Gait Details: Pt huffing with ambulation, reports it's due to spasm around sternal incision; independent with  ambulation. Further distance limited by fatigue  Stairs            Wheelchair Mobility    Modified Rankin (Stroke Patients Only)       Balance Overall balance assessment: Needs assistance   Sitting balance-Leahy Scale: Normal       Standing balance-Leahy Scale: Good                               Pertinent Vitals/Pain Pain Assessment: Faces Faces Pain Scale: Hurts little more Pain Location: Chest spasm Pain Descriptors / Indicators: Spasm Pain Intervention(s): Monitored during session    Home Living Family/patient expects to be discharged to:: Private residence Living Arrangements: Spouse/significant other Available Help at Discharge: Family;Available 24 hours/day Type of Home: House Home Access: Stairs to enter     Home Layout: One level        Prior Function Level of Independence: Independent         Comments: Independent with ambulation; reports up to walking 2x 30-min on day prior to admission. Wife assists with ADLs/household tasks as needed     Hand Dominance        Extremity/Trunk Assessment   Upper Extremity Assessment Upper Extremity Assessment: Overall WFL for tasks assessed    Lower Extremity Assessment Lower Extremity Assessment: Overall WFL for tasks assessed       Communication   Communication: No difficulties  Cognition Arousal/Alertness: Awake/alert Behavior During Therapy: WFL for tasks assessed/performed;Anxious Overall Cognitive Status: Within Functional Limits for tasks assessed  General Comments General comments (skin integrity, edema, etc.): Wife present at end of session. Discussed gentle shoulder rolls/ROM/retraction, cervical ROM/stretching    Exercises     Assessment/Plan    PT Assessment [place] does not need any further PT services  PT Problem List         PT Treatment Interventions      PT Goals (Current goals can be found in the  Care Plan section)  Acute Rehab PT Goals PT Goal Formulation: All assessment and education complete, DC therapy    Frequency     Barriers to discharge        Co-evaluation               AM-PAC PT "6 Clicks" Mobility  Outcome Measure Help needed turning from your back to your side while in a flat bed without using bedrails?: None Help needed moving from lying on your back to sitting on the side of a flat bed without using bedrails?: None Help needed moving to and from a bed to a chair (including a wheelchair)?: None Help needed standing up from a chair using your arms (e.g., wheelchair or bedside chair)?: None Help needed to walk in hospital room?: None Help needed climbing 3-5 steps with a railing? : None 6 Click Score: 24    End of Session   Activity Tolerance: Patient tolerated treatment well Patient left: in chair;with call bell/phone within reach;with family/visitor present;with nursing/sitter in room Nurse Communication: Mobility status PT Visit Diagnosis: Other abnormalities of gait and mobility (R26.89)    Time: [id number] PT Time Calculation (min) (ACUTE ONLY): 17 min   Charges:   PT Evaluation $PT Eval Low Complexity: [place], PT, DPT Acute Rehabilitation Services  Pager [phone] Office [place] 09/17/2019, 3:36 PM

## 2019-09-17 NOTE — Care Management (Addendum)
Consult for price on colchicine. Good Rx search yields prices of $64+ using Good Rx coupon at various pharmacies. [facility] is $64.58 [facility] is $67.20, USING GOOD RX coupon.  Spoke w patient and wife in room, they are familiar w Good Rx app and had it on their phone already.

## 2019-09-17 NOTE — Progress Notes (Signed)
Echocardiogram 2D Echocardiogram has been performed.  Oneal Deputy Ysabella Babiarz 09/17/2019, 11:48 AM

## 2019-09-17 NOTE — Progress Notes (Addendum)
PROGRESS NOTE  Jeffrey Tucker TDD:[id number] DOB: [birth year]   PCP: Venia Carbon, MD  Patient is from: Home  DOA: 09/16/2019 LOS: 1  Brief Narrative / Interim history: 63 year old male with history of CAD/CABG x2, AAA repair and AVR (Bentall procedure) on 08/27/2019, pulmonary HTN, former smoker, asthma, HTN, HLD, hypothyroidism and BPH presenting with chest discomfort, generalized weakness, episodic "lung spasm", edema, fever to 102 and dry cough.   In ED, HR 110s.  RR in 34s.  BP normal.  Saturation upper 90s on room air.  WBC 13.  Hgb 8.1 (baseline).  Platelets 630.  Mild troponin elevation to 149> 168.  BNP 450.  CRP 16.4.  ESR 89.  TSH 10.  EKG without acute ischemic finding.  CXR with small left pleural effusion and associated LLL opacity.  CTA chest moderate cardiomegaly with evidence of right heart dysfunction, slight interval increase in the amount of pericardial effusion, 5 x 5 cm ill-defined collection anterior inferior to the heart and posterior to sternotomy suggestive for postsurgical seroma, improved aeration of lungs with decrease in consolidative changes of LLE compared to prior CT and no PE.  Cardiology and CTS consulted and patient was admitted for post cardiotomy syndrome.  Subjective: Continues to endorse episodic "lung spasms" starting in his abdomen.  Had an episode of cough and "lung spasm" while getting echocardiogram this morning.  Had some chills as well.  Was hemodynamically stable.  No events on telemetry. Denies nausea or vomiting.   Objective: Vitals:   09/16/19 2223 09/17/19 0449 09/17/19 1006 09/17/19 1130  BP: 116/90 111/84 117/83 121/90  Pulse: (!) 112 (!) 108 (!) 102 (!) 113  Resp: 20 20 (!) 22 (!) 25  Temp: 98.8 F (37.1 C) 98.6 F (37 C) 99.5 F (37.5 C) 99.4 F (37.4 C)  TempSrc: Oral Oral Oral Oral  SpO2: 97% 97% 98% 99%  Weight: 69.6 kg 69.5 kg    Height: '5\' 6"'$  (1.676 m)       Intake/Output Summary (Last 24 hours) at 09/17/2019  1447 Last data filed at 09/17/2019 1145 Gross per 24 hour  Intake --  Output 300 ml  Net -300 ml   Filed Weights   09/16/19 1301 09/16/19 2223 09/17/19 0449  Weight: 68 kg 69.6 kg 69.5 kg    Examination:  GENERAL: No acute distress.  Appears well.  HEENT: MMM.  Vision and hearing grossly intact.  NECK: Supple.  No apparent JVD.  RESP:  No IWOB.  Diminished aeration over lower lung fields.  No wheeze or crackles. CVS:  RRR. Heart sounds normal.  No friction rubs. ABD/GI/GU: Bowel sounds present. Soft. Non tender.  MSK/EXT:  Moves extremities. No apparent deformity.  1+ edema bilaterally, right greater than left SKIN: Sternotomy wound clean and healing. NEURO: Awake, alert and oriented appropriately.  No gross deficit.  PSYCH: Calm. Normal affect.   Assessment & Plan: Chest discomfort/dyspnea/"lung spasm": unclear etiology of this.  Patient has fever, chills and some LLL consolidative changes on CT although improved.  He has elevated inflammatory markers and pericardial effusion and started on colchicine.  He also have mild elevated BNP and troponin.  He had similar episode while lying down for echo this morning suggesting orthopnea.  There could also be anxiety driven although patient denies.  Has history of asthma as well.  His exam is not impressive except for diminished aeration over lower lung fields and bilateral lower extremity edema. -Start ceftriaxone and azithromycin to cover for possible pneumonia -Budesonide, DuoNeb,  PPI, as needed Ativan -Diuretics per cardiology -Closely monitor fluid status and renal function -Follow echocardiogram.  CAD/CABG x2, AAA repair and AVR on 08/27/2019: CTA negative for PE but some pericardial effusion.  No signs of tamponade.  Inflammatory markers elevated.  No friction rubs on exam.  Slightly elevated troponin and BNP as well. -Continue colchicine 0.6 mg twice daily -Continue aspirin 325 mg daily -Continue statins. -Diuretics per  cardiology. -Follow echocardiogram. -CTS consulted in ED.  Acute/subacute blood loss anemia: Hgb 8.1> 7.8.  Baseline 7-8.  No signs of active bleed. -Continue iron supplementation -Ideally would keep Hgb greater than 8.0 given history of CAD. -Check anemia panel.  Right lower extremity swelling s/p SVG harvest.  Bilateral Doppler negative for DVT.  No calf tenderness. -Encourage leg elevation -Diuretics per cardiology. -We will try TED hose.  Mild intermittent asthma: No wheezing.  Unclear explanation for his "lung spasm" -Breathing treatments as above. -Continue Tessalon Perles  Essential hypertension: Normotensive. -Per cardiology.  Hypothyroidism: TSH marginally elevated.  -Continue home Synthroid -Recheck TSH in 4 to 6 weeks outpatient.   Thrombocytosis: Reactive? -Continue monitoring.  Mildly elevated alkaline phosphatase -Continue trending.  DVT prophylaxis: Start subcu Lovenox Code Status: Full code Family Communication: Updated patient's wife at bedside. Disposition Plan: Remains inpatient Consultants: Cardiology, CTS  Procedures:  None  Microbiology summarized: [id number] negative. Blood cultures negative so far.  Sch Meds:  Scheduled Meds:  aspirin  325 mg Oral Daily   atorvastatin  80 mg Oral q1800   [START ON 09/18/2019] azithromycin  250 mg Oral Daily   benzonatate  100 mg Oral TID   budesonide (PULMICORT) nebulizer solution  0.5 mg Nebulization BID   colchicine  0.6 mg Oral BID   ferrous sulfate  325 mg Oral Q breakfast   fluticasone furoate-vilanterol  1 puff Inhalation Daily   ipratropium-albuterol  3 mL Nebulization Q6H   levothyroxine  125 mcg Oral Daily   Continuous Infusions:  cefTRIAXone (ROCEPHIN)  IV 2 g (09/17/19 1323)   PRN Meds:.acetaminophen **OR** acetaminophen, ipratropium-albuterol, LORazepam, morphine injection, ondansetron **OR** ondansetron (ZOFRAN) IV, polyethylene glycol,  traMADol  Antimicrobials: Anti-infectives (From admission, onward)   Start     Dose/Rate Route Frequency Ordered Stop   09/18/19 1000  azithromycin (ZITHROMAX) tablet 250 mg     250 mg Oral Daily 09/17/19 1208 09/22/19 0959   09/17/19 1300  cefTRIAXone (ROCEPHIN) 2 g in sodium chloride 0.9 % 100 mL IVPB     2 g 200 mL/hr over 30 Minutes Intravenous Every 24 hours 09/17/19 1208     09/17/19 1215  azithromycin (ZITHROMAX) tablet 500 mg     500 mg Oral Daily 09/17/19 1208 09/17/19 1319       I have personally reviewed the following labs and images: CBC: Recent Labs  Lab 09/15/19 0911 09/16/19 1336 09/17/19 0303  WBC 7.7 13.0* 11.2*  NEUTROABS  --  10.9*  --   HGB 8.1* 8.1* 7.8*  HCT 26.6* 27.0* 25.1*  MCV 92.7 94.4 90.3  PLT 629* 630* 648*   BMP &GFR Recent Labs  Lab 09/15/19 0911 09/16/19 1336 09/16/19 2057 09/17/19 0303  NA 136 136  --  133*  K 4.1 4.0  --  4.9  CL 101 101  --  100  CO2 25 22  --  21*  GLUCOSE 148* 162*  --  160*  BUN 16 14  --  17  CREATININE 0.87 0.86  --  0.92  CALCIUM 8.7* 8.7*  --  8.6*  MG  --   --  2.1  --   PHOS  --   --  5.2*  --    Estimated Creatinine Clearance: 74.2 mL/min (by C-G formula based on SCr of 0.92 mg/dL). Liver & Pancreas: Recent Labs  Lab 09/16/19 1336 09/17/19 0303  AST 26 28  ALT 16 21  ALKPHOS 151* 157*  BILITOT 0.6 0.5  PROT 7.3 7.3  ALBUMIN 2.8* 2.7*   No results for input(s): LIPASE, AMYLASE in the last 168 hours. No results for input(s): AMMONIA in the last 168 hours. Diabetic: No results for input(s): HGBA1C in the last 72 hours. No results for input(s): GLUCAP in the last 168 hours. Cardiac Enzymes: No results for input(s): CKTOTAL, CKMB, CKMBINDEX, TROPONINI in the last 168 hours. No results for input(s): PROBNP in the last 8760 hours. Coagulation Profile: No results for input(s): INR, PROTIME in the last 168 hours. Thyroid Function Tests: Recent Labs    09/16/19 2057  TSH 10.228*   Lipid  Profile: No results for input(s): CHOL, HDL, LDLCALC, TRIG, CHOLHDL, LDLDIRECT in the last 72 hours. Anemia Panel: No results for input(s): VITAMINB12, FOLATE, FERRITIN, TIBC, IRON, RETICCTPCT in the last 72 hours. Urine analysis:    Component Value Date/Time   COLORURINE YELLOW 08/25/2019 0902   APPEARANCEUR CLEAR 08/25/2019 0902   APPEARANCEUR Cloudy (A) 06/19/2017 0828   LABSPEC 1.021 08/25/2019 0902   PHURINE 5.0 08/25/2019 0902   GLUCOSEU NEGATIVE 08/25/2019 0902   HGBUR NEGATIVE 08/25/2019 0902   BILIRUBINUR NEGATIVE 08/25/2019 0902   BILIRUBINUR Negative 06/19/2017 0828   KETONESUR NEGATIVE 08/25/2019 0902   PROTEINUR NEGATIVE 08/25/2019 0902   NITRITE NEGATIVE 08/25/2019 0902   LEUKOCYTESUR NEGATIVE 08/25/2019 0902   Sepsis Labs: Invalid input(s): PROCALCITONIN, [place]  Microbiology: Recent Results (from the past 240 hour(s))  Novel Coronavirus, NAA ([facility])     Status: None   Collection Time: 09/15/19 10:50 AM   Specimen: Oropharyngeal(OP) collection in vial transport medium   OROPHARYNGEA  TESTING  Result Value Ref Range Status   SARS-CoV-2, NAA Not Detected Not Detected Final    Comment: This nucleic acid amplification test was developed and its performance characteristics determined by [employer]. Nucleic acid amplification tests include PCR and TMA. This test has not been FDA cleared or approved. This test has been authorized by FDA under an Emergency Use Authorization (EUA). This test is only authorized for the duration of time the declaration that circumstances exist justifying the authorization of the emergency use of in vitro diagnostic tests for detection of SARS-CoV-2 virus and/or diagnosis of COVID-19 infection under section 564(b)(1) of the Act, 21 U.S.C. 374MOL-0(B) (1), unless the authorization is terminated or revoked sooner. When diagnostic testing is negative, the possibility of a false negative result should be considered in the  context of a patient's recent exposures and the presence of clinical signs and symptoms consistent with COVID-19. An individual without symptoms of COVID-19 and who is not shedding SARS-CoV-2 virus would  expect to have a negative (not detected) result in this assay.   SARS CORONAVIRUS 2 (TAT 6-24 HRS) Nasopharyngeal Nasopharyngeal Swab     Status: None   Collection Time: 09/16/19  6:00 PM   Specimen: Nasopharyngeal Swab  Result Value Ref Range Status   SARS Coronavirus 2 NEGATIVE NEGATIVE Final    Comment: (NOTE) SARS-CoV-2 target nucleic acids are NOT DETECTED. The SARS-CoV-2 RNA is generally detectable in upper and lower respiratory specimens during the acute phase of infection. Negative results do not  preclude SARS-CoV-2 infection, do not rule out co-infections with other pathogens, and should not be used as the sole basis for treatment or other patient management decisions. Negative results must be combined with clinical observations, patient history, and epidemiological information. The expected result is Negative. Fact Sheet for Patients: SugarRoll.be Fact Sheet for Healthcare Providers: https://www.woods-mathews.com/ This test is not yet approved or cleared by the [place] FDA and  has been authorized for detection and/or diagnosis of SARS-CoV-2 by FDA under an Emergency Use Authorization (EUA). This EUA will remain  in effect (meaning this test can be used) for the duration of the COVID-19 declaration under Section 56 4(b)(1) of the Act, 21 U.S.C. section 360bbb-3(b)(1), unless the authorization is terminated or revoked sooner. Performed at [facility], [place] [address], [place], [place] [address]   Culture, blood (routine x 2)     Status: None (Preliminary result)   Collection Time: 09/16/19  8:57 PM   Specimen: BLOOD  Result Value Ref Range Status   Specimen Description BLOOD RIGHT ANTECUBITAL  Final   Special  Requests   Final    BOTTLES DRAWN AEROBIC AND ANAEROBIC Blood Culture adequate volume   Culture   Final    NO GROWTH < 12 HOURS Performed at [facility], [place] [address], [place], [place] [address]    Report Status PENDING  Incomplete  Culture, blood (routine x 2)     Status: None (Preliminary result)   Collection Time: 09/16/19  9:08 PM   Specimen: BLOOD RIGHT HAND  Result Value Ref Range Status   Specimen Description BLOOD RIGHT HAND  Final   Special Requests   Final    BOTTLES DRAWN AEROBIC AND ANAEROBIC Blood Culture results may not be optimal due to an inadequate volume of blood received in culture bottles   Culture   Final    NO GROWTH < 12 HOURS Performed at [facility], [place] [address], [place], [place] [address]    Report Status PENDING  Incomplete    Radiology Studies: Vas [place] Lower Extremity Venous (dvt) (only [facility] [place])  Result Date: 09/17/2019  Lower Venous Study Indications: Pain, Swelling, and Recent CABG and aortic root replacement 08/27/19.  Risk Factors: Right GSV harvest. Limitations: Pants on, unable to remove pants secondary to chest pain. Comparison Study: No prior study Performing Technologist: Sharion Dove RVS  Examination Guidelines: A complete evaluation includes B-mode imaging, spectral Doppler, color Doppler, and power Doppler as needed of all accessible portions of each vessel. Bilateral testing is considered an integral part of a complete examination. Limited examinations for reoccurring indications may be performed as noted.  +---------+---------------+---------+-----------+----------+--------------+  RIGHT     Compressibility Phasicity Spontaneity Properties Thrombus Aging  +---------+---------------+---------+-----------+----------+--------------+  CFV                                                        Not visualized  +---------+---------------+---------+-----------+----------+--------------+  SFJ                                                         Not visualized  +---------+---------------+---------+-----------+----------+--------------+  FV Prox   Full  pulsatile       +---------+---------------+---------+-----------+----------+--------------+  FV Mid    Full                                                             +---------+---------------+---------+-----------+----------+--------------+  FV Distal Full                                                             +---------+---------------+---------+-----------+----------+--------------+  PFV       Full                                                             +---------+---------------+---------+-----------+----------+--------------+  POP       Full                                             pulsatile       +---------+---------------+---------+-----------+----------+--------------+  PTV       Full                                                             +---------+---------------+---------+-----------+----------+--------------+  PERO      Full                                                             +---------+---------------+---------+-----------+----------+--------------+   Left Technical Findings: Left leg not evaluated.   Summary: Right: There is no evidence of deep vein thrombosis in the lower extremity. However, portions of this examination were limited- see technologist comments above.  *See table(s) above for measurements and observations. Electronically signed by Servando Snare MD on 09/17/2019 at 2:17:07 PM.    Final    Vas [place] Lower Extremity Venous (dvt)  Result Date: 09/17/2019  Lower Venous Study Indications: Swelling, and Edema.  Comparison Study: 09/16/19- previous study Performing Technologist: Abram Sander RVS  Examination Guidelines: A complete evaluation includes B-mode imaging, spectral Doppler, color Doppler, and power Doppler as needed of all accessible portions of each vessel. Bilateral testing is considered an integral  part of a complete examination. Limited examinations for reoccurring indications may be performed as noted.  +---------+---------------+---------+-----------+----------+--------------+  LEFT      Compressibility Phasicity Spontaneity Properties Thrombus Aging  +---------+---------------+---------+-----------+----------+--------------+  CFV       Full            Yes       Yes                                    +---------+---------------+---------+-----------+----------+--------------+  SFJ       Full                                                             +---------+---------------+---------+-----------+----------+--------------+  FV Prox   Full                                                             +---------+---------------+---------+-----------+----------+--------------+  FV Mid    Full                                                             +---------+---------------+---------+-----------+----------+--------------+  FV Distal Full                                                             +---------+---------------+---------+-----------+----------+--------------+  PFV       Full                                                             +---------+---------------+---------+-----------+----------+--------------+  POP       Full            Yes       Yes                                    +---------+---------------+---------+-----------+----------+--------------+  PTV       Full                                                             +---------+---------------+---------+-----------+----------+--------------+  PERO      Full                                                             +---------+---------------+---------+-----------+----------+--------------+     Summary: Left: There is no evidence of deep vein thrombosis in the lower extremity. No cystic structure found in the popliteal fossa.  *See table(s) above for measurements and observations. Electronically signed by Deitra Mayo MD on  09/17/2019 at 11:35:06 AM.    Final     Cornelis Kluver T. Izick Gasbarro [facility]  If 7PM-7AM, please contact night-coverage [email or web address] Password [facility] 09/17/2019, 2:47 PM

## 2019-09-17 NOTE — Progress Notes (Addendum)
The patient has been seen in conjunction with Reino Bellis, NP. All aspects of care have been considered and discussed. The patient has been personally interviewed, examined, and all clinical data has been reviewed.   Inflammatory markers elevated ? Secondary to surgery vs. Post cardiotomy syndrome. On colchicine.  Ambulate.  Input from Dr. Orvan Seen as well.  Progress Note  Patient Name: Jeffrey Tucker Date of Encounter: 09/17/2019  Primary Cardiologist: Ida Rogue, MD   Subjective   Chest does feel better this morning.   Inpatient Medications    Scheduled Meds:  aspirin  325 mg Oral Daily   atorvastatin  20 mg Oral q1800   benzonatate  100 mg Oral TID   colchicine  0.3 mg Oral BID   ferrous sulfate  325 mg Oral Q breakfast   levothyroxine  125 mcg Oral Daily   Continuous Infusions:  PRN Meds: acetaminophen **OR** acetaminophen, albuterol, morphine injection, ondansetron **OR** ondansetron (ZOFRAN) IV, polyethylene glycol, traMADol   Vital Signs    Vitals:   09/16/19 2115 09/16/19 2130 09/16/19 2223 09/17/19 0449  BP: 103/84 115/89 116/90 111/84  Pulse: (!) 120 (!) 115 (!) 112 (!) 108  Resp: (!) 29 (!) 22 20 20   Temp:   98.8 F (37.1 C) 98.6 F (37 C)  TempSrc:   Oral Oral  SpO2: 97% 95% 97% 97%  Weight:   69.6 kg 69.5 kg  Height:   5\' 6"  (1.676 m)    No intake or output data in the 24 hours ending 09/17/19 0848 Last 3 Weights 09/17/2019 09/16/2019 09/16/2019  Weight (lbs) 153 lb 4.8 oz 153 lb 8 oz 150 lb  Weight (kg) 69.536 kg 69.627 kg 68.04 kg      Telemetry    ST - Personally Reviewed  ECG    No new tracing   Physical Exam  Pleasant older WM GEN: No acute distress.   Neck: No JVD Cardiac: RRR, no murmurs, rubs, or gallops.  Respiratory: Crackles on the right GI: Soft, nontender, non-distended  MS: 1+ pitting edema to RLE; mottled appearance, cool to touch to LLE. + DP pulses noted. Neuro:  Nonfocal  Psych: Normal affect    Labs    High Sensitivity Troponin:   Recent Labs  Lab 09/16/19 1336 09/16/19 1519  TROPONINIHS 149* 168*      Chemistry Recent Labs  Lab 09/15/19 0911 09/16/19 1336 09/17/19 0303  NA 136 136 133*  K 4.1 4.0 4.9  CL 101 101 100  CO2 25 22 21*  GLUCOSE 148* 162* 160*  BUN 16 14 17   CREATININE 0.87 0.86 0.92  CALCIUM 8.7* 8.7* 8.6*  PROT  --  7.3 7.3  ALBUMIN  --  2.8* 2.7*  AST  --  26 28  ALT  --  16 21  ALKPHOS  --  151* 157*  BILITOT  --  0.6 0.5  GFRNONAA >60 >60 >60  GFRAA >60 >60 >60  ANIONGAP 10 13 12      Hematology Recent Labs  Lab 09/15/19 0911 09/16/19 1336 09/17/19 0303  WBC 7.7 13.0* 11.2*  RBC 2.87* 2.86* 2.78*  HGB 8.1* 8.1* 7.8*  HCT 26.6* 27.0* 25.1*  MCV 92.7 94.4 90.3  MCH 28.2 28.3 28.1  MCHC 30.5 30.0 31.1  RDW 15.0 14.9 15.0  PLT 629* 630* 648*    BNP Recent Labs  Lab 09/16/19 2057  BNP 451.3*     DDimer No results for input(s): DDIMER in the last 168 hours.  Radiology    Ct Angio Chest Pe W And/or Wo Contrast  Result Date: 09/16/2019 CLINICAL DATA:  63 year old male with concern for pulmonary embolism. EXAM: CT ANGIOGRAPHY CHEST WITH CONTRAST TECHNIQUE: Multidetector CT imaging of the chest was performed using the standard protocol during bolus administration of intravenous contrast. Multiplanar CT image reconstructions and MIPs were obtained to evaluate the vascular anatomy. CONTRAST:  165mL OMNIPAQUE IOHEXOL 350 MG/ML SOLN COMPARISON:  Chest CT dated 09/02/2019 FINDINGS: Cardiovascular: Moderate cardiomegaly similar to prior CT. Aortic valve repair/replacement. There is retrograde flow of contrast from the right atrium into the IVC suggestive of a degree of right heart dysfunction. There has been slight interval increase in the amount of pericardial effusion and edema compared to prior CT. A 5.2 x 5.0 cm ill-defined collection anterior and inferior to the heart and posterior to sternotomy (series 5, image 74), likely  postsurgical seroma. Attention on follow-up imaging recommended. There is mild atherosclerotic calcification of the aortic arch. No dissection. Similar appearance of low attenuating fluid surrounding the aortic root. No extravasation of contrast. Evaluation of the pulmonary arteries is limited due to respiratory motion artifact and streak artifact caused by patient's arms. There is a small nonocclusive intraluminal hypodensity in the left upper lobe branch (series 6, image 81, sagittal series 11 image 105, and coronal series 10, image 82). This most likely represent an area of scarring and appears to have been present on the prior CT of 09/02/2019 (series 6, image 51). No definite acute large or central pulmonary artery embolus identified. Mediastinum/Nodes: No hilar adenopathy. The esophagus is grossly unremarkable. Postsurgical changes and edema in the anterior mediastinum. Top-normal mediastinal lymph nodes measure up to 11 mm in short axis in the prevascular space, likely reactive. Lungs/Pleura: There is a small left pleural effusion with partial consolidative changes of the left lower lobe which may represent atelectasis versus infiltrate. Overall improved aeration of the left lower lobe as well as left upper lobe compared to the prior CT. Decreased in the right lower lobe consolidative change since the prior CT with improved aeration of the right lung. There is a trace right pleural effusion significantly decreased in size since the prior CT. Upper Abdomen: There is a 6 mm nonobstructing left renal interpolar calculus. Cholecystectomy. There is a 5.5 cm right renal cyst. Musculoskeletal: Median sternotomy wires. No acute osseous pathology. Review of the MIP images confirms the above findings. IMPRESSION: 1. No definite CT evidence of central pulmonary artery embolus. Small nonocclusive intraluminal hypodensity in the left upper lobe branch most likely represents an area of scarring. 2. Moderate cardiomegaly  with evidence of right heart dysfunction. 3. Slight interval increase in the amount of pericardial effusion and edema compared to the prior CT. 4. Overall improved aeration of the lungs with decrease in the consolidative changes of the lower lobes compared to the prior CT. Significant interval decrease in the size of the right pleural effusion. 5. Aortic Atherosclerosis (ICD10-I70.0). Electronically Signed   By: Anner Crete M.D.   On: 09/16/2019 14:39   Dg Chest Portable 1 View  Result Date: 09/16/2019 CLINICAL DATA:  Status post aortic valve replacement on 10/01 number shortness of breath with chest pressure following breakfast EXAM: PORTABLE CHEST 1 VIEW COMPARISON:  09/10/2019 FINDINGS: No frank interstitial edema. Small left pleural effusion. Associated left lower lobe opacity, likely atelectasis. Possible trace right pleural effusion. No pneumothorax. Cardiomegaly.  Prosthetic aortic valve. Surgical clips along the right lateral chest wall/axilla. Median sternotomy. IMPRESSION: Small left pleural effusion.  Associated left lower lobe opacity, likely atelectasis. Electronically Signed   By: Julian Hy M.D.   On: 09/16/2019 14:16   Vas [place] Lower Extremity Venous (dvt) (only [facility])  Result Date: 09/16/2019  Lower Venous Study Indications: Pain, Swelling, and Recent CABG and aortic root replacement 08/27/19.  Risk Factors: Right GSV harvest. Limitations: Pants on, unable to remove pants secondary to chest pain. Comparison Study: No prior study Performing Technologist: Sharion Dove RVS  Examination Guidelines: A complete evaluation includes B-mode imaging, spectral Doppler, color Doppler, and power Doppler as needed of all accessible portions of each vessel. Bilateral testing is considered an integral part of a complete examination. Limited examinations for reoccurring indications may be performed as noted.  +---------+---------------+---------+-----------+----------+--------------+  RIGHT      Compressibility Phasicity Spontaneity Properties Thrombus Aging  +---------+---------------+---------+-----------+----------+--------------+  CFV                                                        Not visualized  +---------+---------------+---------+-----------+----------+--------------+  SFJ                                                        Not visualized  +---------+---------------+---------+-----------+----------+--------------+  FV Prox   Full                                             pulsatile       +---------+---------------+---------+-----------+----------+--------------+  FV Mid    Full                                                             +---------+---------------+---------+-----------+----------+--------------+  FV Distal Full                                                             +---------+---------------+---------+-----------+----------+--------------+  PFV       Full                                                             +---------+---------------+---------+-----------+----------+--------------+  POP       Full                                             pulsatile       +---------+---------------+---------+-----------+----------+--------------+  PTV       Full                                                             +---------+---------------+---------+-----------+----------+--------------+  PERO      Full                                                             +---------+---------------+---------+-----------+----------+--------------+   Left Technical Findings: Left leg not evaluated.   Summary: Right: There is no evidence of deep vein thrombosis in the lower extremity. However, portions of this examination were limited- see technologist comments above.  *See table(s) above for measurements and observations.    Preliminary     Cardiac Studies   N/a   Patient Profile     63 y.o. male CAD s/p CABG x 2 (SVG-OM, SVG-PDA 08/27/19), AAA, bicuspid aortic valve with  severe valvular regrugitation s/p bentall procedure, pulmonary hypertension, former smoker, asthma, HTN, HLD, hypothyroidism, and BPH. He presented to [facility] with CP relieved with nitro x 3.   Assessment & Plan    1. Chest pain, lung spasms: had another episode of cough and lung spasm this morning. States these are very brief, but feels tight in his throat. No meds with this side effect noted. BB was stopped because of these. Will resume his home advair and continue albuterol as needed.  -- may consider a cough suppressant.   2. S/P CABG x 2, AAA repair, and AVR on 08/27/19: CTA negative for PE, Sed rate, CRP are significantly elevated consistent with inflammation. Will increase colchicine from 0.3mg  BID to 0.6mg  BID. Consider adding NSAIDs/steriods in conjunction.  -- Hs troponin peaked at 168, Continue 325 mg ASA, lipitor -- echo pending as CT noted slight increase in pericardial effusion  3. Hypertension: BB stopped at clinic visit for concern for asthma exacerbation, lung spasms?  4. Post-op anemia: Hbg 8.1>>7.8, no signs of active bleeding -- continue iron supplements  5. Right lower extremity swelling - SVG harvest site: pt reports swelling had resolved since surgery. Right lower extremity duplex negative for DVT. Left LL is mottled appearing. Would check doppler study on left.   6. Fever with Tmax 102 on 09/14/19: COVID negative. No fevers overnight. BC negative thus far.   7. Evidence of right heart dysfunction on CT: moderate cardiomegaly, with slight increase in pericardial effusion. Echo pending. BNP >450 - will give IV lasix 40mg  x1.   8. HL: will further increase statin therapy given CAD.  For questions or updates, please contact [place] Please consult [email or web address] for contact info under    Signed, Reino Bellis, NP  09/17/2019, 8:48 AM

## 2019-09-18 DIAGNOSIS — E871 Hypo-osmolality and hyponatremia: Secondary | ICD-10-CM

## 2019-09-18 DIAGNOSIS — E039 Hypothyroidism, unspecified: Secondary | ICD-10-CM

## 2019-09-18 DIAGNOSIS — N179 Acute kidney failure, unspecified: Secondary | ICD-10-CM

## 2019-09-18 LAB — PREPARE RBC (CROSSMATCH)

## 2019-09-18 LAB — BASIC METABOLIC PANEL
Anion gap: 15 (ref 5–15)
BUN: 26 mg/dL — ABNORMAL HIGH (ref 8–23)
CO2: 20 mmol/L — ABNORMAL LOW (ref 22–32)
Calcium: 8.5 mg/dL — ABNORMAL LOW (ref 8.9–10.3)
Chloride: 96 mmol/L — ABNORMAL LOW (ref 98–111)
Creatinine, Ser: 1.28 mg/dL — ABNORMAL HIGH (ref 0.61–1.24)
GFR calc Af Amer: >60 mL/min (ref >60)
GFR calc non Af Amer: 59 mL/min — ABNORMAL LOW (ref >60)
Glucose, Bld: 180 mg/dL — ABNORMAL HIGH (ref 70–99)
Potassium: 4.6 mmol/L (ref 3.5–5.1)
Sodium: 131 mmol/L — ABNORMAL LOW (ref 135–145)

## 2019-09-18 LAB — HEMOGLOBIN AND HEMATOCRIT, BLOOD
HCT: 26.9 % — ABNORMAL LOW (ref 39.0–52.0)
Hemoglobin: 8.5 g/dL — ABNORMAL LOW (ref 13.0–17.0)

## 2019-09-18 LAB — PROCALCITONIN: Procalcitonin: 0.99 ng/mL

## 2019-09-18 LAB — MAGNESIUM: Magnesium: 2.1 mg/dL (ref 1.7–2.4)

## 2019-09-18 MED ORDER — IPRATROPIUM-ALBUTEROL 0.5-2.5 (3) MG/3ML IN SOLN
3.0000 mL | Freq: Three times a day (TID) | RESPIRATORY_TRACT | Status: DC
  Administered 2019-09-18: 3 mL via RESPIRATORY_TRACT
  Filled 2019-09-18: qty 3

## 2019-09-18 MED ORDER — SODIUM CHLORIDE 0.9% IV SOLUTION
Freq: Once | INTRAVENOUS | Status: AC
  Administered 2019-09-18: 15:00:00 via INTRAVENOUS

## 2019-09-18 MED ORDER — IPRATROPIUM-ALBUTEROL 0.5-2.5 (3) MG/3ML IN SOLN
3.0000 mL | Freq: Two times a day (BID) | RESPIRATORY_TRACT | Status: DC
  Administered 2019-09-18 – 2019-09-24 (×11): 3 mL via RESPIRATORY_TRACT
  Filled 2019-09-18 (×12): qty 3

## 2019-09-18 NOTE — Consult Note (Signed)
[address]       [place] [address]             [phone]        Jeffrey Tucker [place] Medical Record [id number] Date of Birth: 05/28/1956  Referring: No ref. provider found Primary Care: Venia Carbon, MD Primary Cardiologist:Timothy Rockey Situ, MD  Chief Complaint:    Chief Complaint  Patient presents with   Chest Pain    History of Present Illness:     Pt is well-known to me after recent surgery for aortic root replacement. He has done well from CV perspective although he did have bilateral pleural effusions and had left thoracentesis prior to leaving hospital originally. He experienced sudden episode of chest pain Wed am which was severe enough to call 911. Transported to [facility] ED. CT neg for PE, but does show pericardial effusion, confirmed by echo. Working diagnosis of post-cardiotomy syndrome.    Current Activity/ Functional Status: Patient is independent with mobility/ambulation, transfers, ADL's, IADL's.   Zubrod Score: At the time of surgery this patients most appropriate activity status/level should be described as: [x]     0    Normal activity, no symptoms []     1    Restricted in physical strenuous activity but ambulatory, able to do out light work []     2    Ambulatory and capable of self care, unable to do work activities, up and about                 more than 50%  Of the time                            []     3    Only limited self care, in bed greater than 50% of waking hours []     4    Completely disabled, no self care, confined to bed or chair []     5    Moribund  Past Medical History:  Diagnosis Date   Allergy    allergic rhinitis   Aortic root dilatation (HCC)    a. s/p surgical repair 08/27/2019   Asthma    mostly in childhood   Bicuspid aortic valve    a.  Status post bioprosthetic replacement 08/27/2019   BPH (benign prostatic hypertrophy)    CAD (coronary artery disease)    a. s/p 2-v CABG 08/2019 (SVG-OM,  SVG--rPDA)   GERD (gastroesophageal reflux disease)    History of kidney stones    Hypertension    Hypertriglyceridemia    Hypothyroidism    Severe aortic regurgitation    a. bicuspid aortic valve, b. s/p bio Bentall procedure 08/2019    Past Surgical History:  Procedure Laterality Date   BENTALL PROCEDURE N/A 08/27/2019   Procedure: BENTALL PROCEDURE with a 27 mm KONECT Resilia Aortic Valved Conduit and 30 mm x 30 cm Hemashield Platinum straight graft.;  Surgeon: Wonda Olds, MD;  Location: [place];  Service: Open Heart Surgery;  Laterality: N/A;   CARDIAC CATHETERIZATION  2006   [place]   CARDIOVASCULAR STRESS TEST  2006   [place]   CHOLECYSTECTOMY  2006   CORONARY ARTERY BYPASS GRAFT N/A 08/27/2019   Procedure: CORONARY ARTERY BYPASS GRAFTING (CABG) times 2 using right saphenous endoscopic vein harvesting to OM2 nad PDA.;  Surgeon: Wonda Olds, MD;  Location: [place];  Service: Open Heart Surgery;  Laterality: N/A;  CYSTOSCOPY W/ URETERAL STENT PLACEMENT  05/24/2017   Procedure: left;  Surgeon: Nickie Retort, MD;  Location: [facility];  Service: Urology;;   CYSTOSCOPY W/ URETERAL STENT PLACEMENT Left 06/11/2017   Procedure: CYSTOSCOPY WITH STENT REPLACEMENT;  Surgeon: Hollice Espy, MD;  Location: [facility];  Service: Urology;  Laterality: Left;   CYSTOSCOPY WITH BIOPSY Left 06/11/2017   Procedure: RENAL PELVIS TUMOR WITH BIOPSY WITH POSSIBLE LASER ABLATION;  Surgeon: Hollice Espy, MD;  Location: [facility];  Service: Urology;  Laterality: Left;   CYSTOSCOPY WITH STENT PLACEMENT Left 05/24/2017   Procedure: , cystoscopy,left ureteral stent placement and left ureteroscopy attempted, retrograde pylogram;  Surgeon: Nickie Retort, MD;  Location: [facility];  Service: Urology;  Laterality: Left;   EYE SURGERY Bilateral    Cataract Extraction with IOL   IR THORACENTESIS ASP PLEURAL SPACE W/IMG GUIDE  09/02/2019   RIGHT/LEFT HEART CATH AND CORONARY  ANGIOGRAPHY N/A 08/21/2019   Procedure: RIGHT/LEFT HEART CATH AND CORONARY ANGIOGRAPHY;  Surgeon: Minna Merritts, MD;  Location: [place] [facility];  Service: Cardiovascular;  Laterality: N/A;   TEE WITHOUT CARDIOVERSION N/A 08/27/2019   Procedure: TRANSESOPHAGEAL ECHOCARDIOGRAM (TEE);  Surgeon: Wonda Olds, MD;  Location: [place];  Service: Open Heart Surgery;  Laterality: N/A;   URETEROSCOPY WITH HOLMIUM LASER LITHOTRIPSY Left 06/11/2017   Procedure: URETEROSCOPY WITH HOLMIUM LASER LITHOTRIPSY;  Surgeon: Hollice Espy, MD;  Location: [facility];  Service: Urology;  Laterality: Left;   VEIN LIGATION AND STRIPPING Bilateral 1997    Social History   Tobacco Use  Smoking Status Former Smoker   Types: Cigarettes   Quit date: 11/26/1978   Years since quitting: 40.8  Smokeless Tobacco Never Used  Tobacco Comment   social smoked till 1980's    Social History   Substance and Sexual Activity  Alcohol Use No   Alcohol/week: 0.0 standard drinks   Comment: none since 1984     Allergies  Allergen Reactions   Tetracycline Hcl Other (See Comments)    Joint pain (and stiffened them, also)   Amlodipine Swelling    Lower extremities and body became swollen; can tolerate 5 mg doses, however    Current Facility-Administered Medications  Medication Dose Route Frequency Provider Last Rate Last Dose   0.9 %  sodium chloride infusion (Manually program via Guardrails IV Fluids)   Intravenous Once Mercy Riding, MD       acetaminophen (TYLENOL) tablet 650 mg  650 mg Oral Q6H PRN Pahwani, Rinka R, MD       Or   acetaminophen (TYLENOL) suppository 650 mg  650 mg Rectal Q6H PRN Pahwani, Rinka R, MD       aspirin EC tablet 325 mg  325 mg Oral Daily Pahwani, Rinka R, MD   325 mg at 09/17/19 0957   atorvastatin (LIPITOR) tablet 80 mg  80 mg Oral q1800 Reino Bellis B, NP   80 mg at 09/17/19 1900   azithromycin (ZITHROMAX) tablet 250 mg  250 mg Oral Daily Gonfa, Taye T, MD        benzonatate (TESSALON) capsule 100 mg  100 mg Oral TID Pahwani, Rinka R, MD   100 mg at 09/17/19 2149   budesonide (PULMICORT) nebulizer solution 0.5 mg  0.5 mg Nebulization BID Wendee Beavers T, MD   0.5 mg at 09/18/19 0745   cefTRIAXone (ROCEPHIN) 2 g in sodium chloride 0.9 % 100 mL IVPB  2 g Intravenous Q24H Wendee Beavers T, MD 200 mL/hr at 09/17/19  1323 2 g at 09/17/19 1323   colchicine tablet 0.6 mg  0.6 mg Oral BID Reino Bellis B, NP   0.6 mg at 09/17/19 2149   enoxaparin (LOVENOX) injection 40 mg  40 mg Subcutaneous Q24H Wendee Beavers T, MD   40 mg at 09/17/19 1546   ferrous sulfate tablet 325 mg  325 mg Oral Q breakfast Werner Lean, RPH   325 mg at 09/17/19 0957   ipratropium-albuterol (DUONEB) 0.5-2.5 (3) MG/3ML nebulizer solution 3 mL  3 mL Nebulization Q2H PRN Gonfa, Taye T, MD       ipratropium-albuterol (DUONEB) 0.5-2.5 (3) MG/3ML nebulizer solution 3 mL  3 mL Nebulization TID Wendee Beavers T, MD   3 mL at 09/18/19 0745   levothyroxine (SYNTHROID) tablet 125 mcg  125 mcg Oral Daily Pahwani, Rinka R, MD   125 mcg at 09/18/19 0624   LORazepam (ATIVAN) injection 0.5 mg  0.5 mg Intravenous Q6H PRN Wendee Beavers T, MD   0.5 mg at 09/17/19 2149   morphine 2 MG/ML injection 2 mg  2 mg Intravenous Q4H PRN Pahwani, Rinka R, MD   2 mg at 09/17/19 0100   ondansetron (ZOFRAN) tablet 4 mg  4 mg Oral Q6H PRN Pahwani, Rinka R, MD       Or   ondansetron (ZOFRAN) injection 4 mg  4 mg Intravenous Q6H PRN Pahwani, Rinka R, MD   4 mg at 09/17/19 1500   pantoprazole (PROTONIX) EC tablet 40 mg  40 mg Oral Daily Wendee Beavers T, MD   40 mg at 09/17/19 1546   polyethylene glycol (MIRALAX / GLYCOLAX) packet 17 g  17 g Oral Daily PRN Pahwani, Rinka R, MD       traMADol (ULTRAM) tablet 50 mg  50 mg Oral Q8H PRN Mercy Riding, MD        Medications Prior to Admission  Medication Sig Dispense Refill Last Dose   acetaminophen (TYLENOL) 325 MG tablet Take 2 tablets (650 mg total) by mouth every 6 (six)  hours as needed for mild pain.   09/15/2019 at Unknown time   albuterol (VENTOLIN HFA) 108 (90 Base) MCG/ACT inhaler INHALE 2 PUFFS BY MOUTH THREE TIMES DAILY AS NEEDED FOR ASTHMA FLARE (Patient taking differently: Inhale 2 puffs into the lungs as needed for wheezing or shortness of breath. ) 8.5 g 1 unk   aspirin EC 325 MG EC tablet Take 1 tablet (325 mg total) by mouth daily. 30 tablet 0 09/16/2019 at 01000   atorvastatin (LIPITOR) 20 MG tablet Take 1 tablet (20 mg total) by mouth daily at 6 PM. 30 tablet 1 09/15/2019 at Unknown time   benzonatate (TESSALON PERLES) 100 MG capsule Take 1 capsule (100 mg total) by mouth 3 (three) times daily. 30 capsule 2 09/16/2019 at Unknown time   cetirizine (ZYRTEC) 10 MG tablet Take 10 mg by mouth daily.   09/16/2019 at Unknown time   ferrous sulfate 325 (65 FE) MG tablet Take 1 tablet (325 mg total) by mouth daily with breakfast. For one month then stop. If develop constipation, may stop sooner  3 09/16/2019 at Unknown time   Fluticasone-Salmeterol (ADVAIR) 100-50 MCG/DOSE AEPB Inhale 1 puff into the lungs 2 (two) times daily. 60 each 11 09/16/2019 at Unknown time   Glucosamine 500 MG CAPS Take 500 mg by mouth daily.   09/15/2019 at Unknown time   guaiFENesin (MUCINEX) 600 MG 12 hr tablet Take 600 mg by mouth 2 (two) times daily.  09/16/2019 at Unknown time   levothyroxine (SYNTHROID) 125 MCG tablet Take 1 tablet by mouth once daily (Patient taking differently: Take 125 mcg by mouth daily before breakfast. ) 90 tablet 3 09/16/2019 at Unknown time   polyethylene glycol (MIRALAX) 17 g packet Take 17 g by mouth daily as needed.   09/15/2019 at Unknown time   traMADol (ULTRAM) 50 MG tablet Take 1 tablet (50 mg total) by mouth every 4 (four) hours as needed for moderate pain. 28 tablet 0 09/16/2019 at Unknown time   ketoconazole (NIZORAL) 2 % shampoo Apply 1 application topically once a week. (Patient not taking: Reported on 09/16/2019)   Not Taking at  Unknown time    Family History  Problem Relation Age of Onset   Alcohol abuse Father    Prostate cancer Father    Alcohol abuse Mother    Asthma Mother    Liver cancer Paternal Grandmother    Colon cancer Neg Hx    Coronary artery disease Neg Hx    Hypertension Neg Hx    Diabetes Neg Hx    Bladder Cancer Neg Hx    Kidney cancer Neg Hx      Review of Systems:   ROS Pertinent items noted in HPI and remainder of comprehensive ROS otherwise negative.     Cardiac Review of Systems: Y or  [    ]= no  Chest Pain [    ]  Resting SOB [   ] Exertional SOB  [  ]  Orthopnea [  ]   Pedal Edema [   ]    Palpitations [  ] Syncope  [  ]   Presyncope [   ]  General Review of Systems: [Y] = yes [  ]=no Constitional: recent weight change [  ]; anorexia [  ]; fatigue [  ]; nausea [  ]; night sweats [  ]; fever [  ]; or chills [  ]                                                               Dental: Last Dentist visit:   Eye : blurred vision [  ]; diplopia [   ]; vision changes [  ];  Amaurosis fugax[  ]; Resp: cough [  ];  wheezing[  ];  hemoptysis[  ]; shortness of breath[  ]; paroxysmal nocturnal dyspnea[  ]; dyspnea on exertion[  ]; or orthopnea[  ];  GI:  gallstones[  ], vomiting[  ];  dysphagia[  ]; melena[  ];  hematochezia [  ]; heartburn[  ];   Hx of  Colonoscopy[  ]; GU: kidney stones [  ]; hematuria[  ];   dysuria [  ];  nocturia[  ];  history of     obstruction [  ]; urinary frequency [  ]             Skin: rash, swelling[  ];, hair loss[  ];  peripheral edema[  ];  or itching[  ]; Musculosketetal: myalgias[  ];  joint swelling[  ];  joint erythema[  ];  joint pain[  ];  back pain[  ];  Heme/Lymph: bruising[  ];  bleeding[  ];  anemia[  ];  Neuro: TIA[  ];  headaches[  ];  stroke[  ];  vertigo[  ];  seizures[  ];   paresthesias[  ];  difficulty walking[  ];  Psych:depression[  ]; anxiety[  ];  Endocrine: diabetes[  ];  thyroid dysfunction[  ];              Physical  Exam: BP 107/80 (BP Location: Right Arm)    Pulse (!) 102    Temp 98.3 F (36.8 C) (Oral)    Resp (!) 29    Ht 5\' 6"  (1.676 m)    Wt 69.5 kg    SpO2 92%    BMI 24.74 kg/m    General appearance: alert and cooperative Resp: clear to auscultation bilaterally GI: soft, non-tender; bowel sounds normal; no masses,  no organomegaly Extremities: edema 1+ Neurologic: Alert and oriented X 3, normal strength and tone. Normal symmetric reflexes. Normal coordination and gait  Diagnostic Studies & Laboratory data:     Recent Radiology Findings:   Ct Angio Chest Pe W And/or Wo Contrast  Result Date: 09/16/2019 CLINICAL DATA:  63 year old male with concern for pulmonary embolism. EXAM: CT ANGIOGRAPHY CHEST WITH CONTRAST TECHNIQUE: Multidetector CT imaging of the chest was performed using the standard protocol during bolus administration of intravenous contrast. Multiplanar CT image reconstructions and MIPs were obtained to evaluate the vascular anatomy. CONTRAST:  139mL OMNIPAQUE IOHEXOL 350 MG/ML SOLN COMPARISON:  Chest CT dated 09/02/2019 FINDINGS: Cardiovascular: Moderate cardiomegaly similar to prior CT. Aortic valve repair/replacement. There is retrograde flow of contrast from the right atrium into the IVC suggestive of a degree of right heart dysfunction. There has been slight interval increase in the amount of pericardial effusion and edema compared to prior CT. A 5.2 x 5.0 cm ill-defined collection anterior and inferior to the heart and posterior to sternotomy (series 5, image 74), likely postsurgical seroma. Attention on follow-up imaging recommended. There is mild atherosclerotic calcification of the aortic arch. No dissection. Similar appearance of low attenuating fluid surrounding the aortic root. No extravasation of contrast. Evaluation of the pulmonary arteries is limited due to respiratory motion artifact and streak artifact caused by patient's arms. There is a small nonocclusive intraluminal  hypodensity in the left upper lobe branch (series 6, image 81, sagittal series 11 image 105, and coronal series 10, image 82). This most likely represent an area of scarring and appears to have been present on the prior CT of 09/02/2019 (series 6, image 51). No definite acute large or central pulmonary artery embolus identified. Mediastinum/Nodes: No hilar adenopathy. The esophagus is grossly unremarkable. Postsurgical changes and edema in the anterior mediastinum. Top-normal mediastinal lymph nodes measure up to 11 mm in short axis in the prevascular space, likely reactive. Lungs/Pleura: There is a small left pleural effusion with partial consolidative changes of the left lower lobe which may represent atelectasis versus infiltrate. Overall improved aeration of the left lower lobe as well as left upper lobe compared to the prior CT. Decreased in the right lower lobe consolidative change since the prior CT with improved aeration of the right lung. There is a trace right pleural effusion significantly decreased in size since the prior CT. Upper Abdomen: There is a 6 mm nonobstructing left renal interpolar calculus. Cholecystectomy. There is a 5.5 cm right renal cyst. Musculoskeletal: Median sternotomy wires. No acute osseous pathology. Review of the MIP images confirms the above findings. IMPRESSION: 1. No definite CT evidence of central pulmonary artery embolus. Small nonocclusive intraluminal hypodensity in the left upper lobe branch most likely represents an area  of scarring. 2. Moderate cardiomegaly with evidence of right heart dysfunction. 3. Slight interval increase in the amount of pericardial effusion and edema compared to the prior CT. 4. Overall improved aeration of the lungs with decrease in the consolidative changes of the lower lobes compared to the prior CT. Significant interval decrease in the size of the right pleural effusion. 5. Aortic Atherosclerosis (ICD10-I70.0). Electronically Signed   By: Anner Crete M.D.   On: 09/16/2019 14:39   Dg Chest Portable 1 View  Result Date: 09/16/2019 CLINICAL DATA:  Status post aortic valve replacement on 10/01 number shortness of breath with chest pressure following breakfast EXAM: PORTABLE CHEST 1 VIEW COMPARISON:  09/10/2019 FINDINGS: No frank interstitial edema. Small left pleural effusion. Associated left lower lobe opacity, likely atelectasis. Possible trace right pleural effusion. No pneumothorax. Cardiomegaly.  Prosthetic aortic valve. Surgical clips along the right lateral chest wall/axilla. Median sternotomy. IMPRESSION: Small left pleural effusion. Associated left lower lobe opacity, likely atelectasis. Electronically Signed   By: Julian Hy M.D.   On: 09/16/2019 14:16   Vas [place] Lower Extremity Venous (dvt) (only [facility])  Result Date: 09/17/2019  Lower Venous Study Indications: Pain, Swelling, and Recent CABG and aortic root replacement 08/27/19.  Risk Factors: Right GSV harvest. Limitations: Pants on, unable to remove pants secondary to chest pain. Comparison Study: No prior study Performing Technologist: Sharion Dove RVS  Examination Guidelines: A complete evaluation includes B-mode imaging, spectral Doppler, color Doppler, and power Doppler as needed of all accessible portions of each vessel. Bilateral testing is considered an integral part of a complete examination. Limited examinations for reoccurring indications may be performed as noted.  +---------+---------------+---------+-----------+----------+--------------+  RIGHT     Compressibility Phasicity Spontaneity Properties Thrombus Aging  +---------+---------------+---------+-----------+----------+--------------+  CFV                                                        Not visualized  +---------+---------------+---------+-----------+----------+--------------+  SFJ                                                        Not visualized   +---------+---------------+---------+-----------+----------+--------------+  FV Prox   Full                                             pulsatile       +---------+---------------+---------+-----------+----------+--------------+  FV Mid    Full                                                             +---------+---------------+---------+-----------+----------+--------------+  FV Distal Full                                                             +---------+---------------+---------+-----------+----------+--------------+  PFV       Full                                                             +---------+---------------+---------+-----------+----------+--------------+  POP       Full                                             pulsatile       +---------+---------------+---------+-----------+----------+--------------+  PTV       Full                                                             +---------+---------------+---------+-----------+----------+--------------+  PERO      Full                                                             +---------+---------------+---------+-----------+----------+--------------+   Left Technical Findings: Left leg not evaluated.   Summary: Right: There is no evidence of deep vein thrombosis in the lower extremity. However, portions of this examination were limited- see technologist comments above.  *See table(s) above for measurements and observations. Electronically signed by Servando Snare MD on 09/17/2019 at 2:17:07 PM.    Final    Vas [place] Lower Extremity Venous (dvt)  Result Date: 09/17/2019  Lower Venous Study Indications: Swelling, and Edema.  Comparison Study: 09/16/19- previous study Performing Technologist: Abram Sander RVS  Examination Guidelines: A complete evaluation includes B-mode imaging, spectral Doppler, color Doppler, and power Doppler as needed of all accessible portions of each vessel. Bilateral testing is considered an integral part of a complete  examination. Limited examinations for reoccurring indications may be performed as noted.  +---------+---------------+---------+-----------+----------+--------------+  LEFT      Compressibility Phasicity Spontaneity Properties Thrombus Aging  +---------+---------------+---------+-----------+----------+--------------+  CFV       Full            Yes       Yes                                    +---------+---------------+---------+-----------+----------+--------------+  SFJ       Full                                                             +---------+---------------+---------+-----------+----------+--------------+  FV Prox   Full                                                             +---------+---------------+---------+-----------+----------+--------------+  FV Mid    Full                                                             +---------+---------------+---------+-----------+----------+--------------+  FV Distal Full                                                             +---------+---------------+---------+-----------+----------+--------------+  PFV       Full                                                             +---------+---------------+---------+-----------+----------+--------------+  POP       Full            Yes       Yes                                    +---------+---------------+---------+-----------+----------+--------------+  PTV       Full                                                             +---------+---------------+---------+-----------+----------+--------------+  PERO      Full                                                             +---------+---------------+---------+-----------+----------+--------------+     Summary: Left: There is no evidence of deep vein thrombosis in the lower extremity. No cystic structure found in the popliteal fossa.  *See table(s) above for measurements and observations. Electronically signed by Deitra Mayo MD on 09/17/2019 at  11:35:06 AM.    Final      I have independently reviewed the above radiologic studies and discussed with the patient  Reviewed echo with cardiology and agree that tamponade is a very low likelihood.  Recent Lab Findings: Lab Results  Component Value Date   WBC 14.0 (H) 09/17/2019   HGB 7.6 (L) 09/17/2019   HCT 24.2 (L) 09/17/2019   PLT 689 (H) 09/17/2019   GLUCOSE 180 (H) 09/18/2019   CHOL 184 04/02/2019   TRIG 207.0 (H) 04/02/2019   HDL 29.60 (L) 04/02/2019   LDLDIRECT 107.0 04/02/2019   LDLCALC 103 (H) 05/26/2012   ALT 21 09/17/2019   AST 28 09/17/2019   NA 131 (L) 09/18/2019   K 4.6 09/18/2019   CL 96 (L) 09/18/2019   CREATININE 1.28 (H) 09/18/2019   BUN 26 (H) 09/18/2019   CO2 20 (L) 09/18/2019  TSH 10.228 (H) 09/16/2019   INR 1.6 (H) 08/27/2019   HGBA1C 5.5 08/25/2019      Assessment / Plan:      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 Ok to use steroids if needed.     I  spent 20 minutes counseling the patient face to face.   Jameah Rouser Z. Orvan Seen, MD  09/18/2019 8:34 AM

## 2019-09-18 NOTE — Progress Notes (Addendum)
The patient has been seen in conjunction with Vin Bhagat, PAC. All aspects of care have been considered and discussed. The patient has been personally interviewed, examined, and all clinical data has been reviewed.   Clinically improved this morning although not near desired state.  Continue to have uncontrollable episodes of "spasms" in abdomen associated with shortness of breath.  Question if these episodes could be related to phrenic nerve inflammation from Postcardiotomy Syndrome..  Continue colchicine.  Patient also on antibiotic therapy.  Follow clinical course and discharge when clinically reasonable based upon resolution of symptoms.  Progress Note  Patient Name: Jeffrey Tucker Date of Encounter: 09/18/2019  Primary Cardiologist: Ida Rogue, MD   Subjective   No recurrent chest pain   Inpatient Medications    Scheduled Meds:  sodium chloride   Intravenous Once   aspirin  325 mg Oral Daily   atorvastatin  80 mg Oral q1800   azithromycin  250 mg Oral Daily   benzonatate  100 mg Oral TID   budesonide (PULMICORT) nebulizer solution  0.5 mg Nebulization BID   colchicine  0.6 mg Oral BID   enoxaparin (LOVENOX) injection  40 mg Subcutaneous Q24H   ferrous sulfate  325 mg Oral Q breakfast   ipratropium-albuterol  3 mL Nebulization TID   levothyroxine  125 mcg Oral Daily   pantoprazole  40 mg Oral Daily   Continuous Infusions:  cefTRIAXone (ROCEPHIN)  IV 2 g (09/17/19 1323)   PRN Meds: acetaminophen **OR** acetaminophen, ipratropium-albuterol, LORazepam, morphine injection, ondansetron **OR** ondansetron (ZOFRAN) IV, polyethylene glycol, traMADol   Vital Signs    Vitals:   09/18/19 0125 09/18/19 0400 09/18/19 0600 09/18/19 0745  BP:  107/80    Pulse:  (!) 111 (!) 102   Resp:  (!) 30 (!) 29   Temp:      TempSrc:      SpO2: 92% 91% 91% 92%  Weight:      Height:        Intake/Output Summary (Last 24 hours) at 09/18/2019 0848 Last data filed at  09/18/2019 0600 Gross per 24 hour  Intake 0 ml  Output 600 ml  Net -600 ml   Last 3 Weights 09/17/2019 09/16/2019 09/16/2019  Weight (lbs) 153 lb 4.8 oz 153 lb 8 oz 150 lb  Weight (kg) 69.536 kg 69.627 kg 68.04 kg      Telemetry    Sinus tachycardia at 10- Personally Reviewed  ECG    N/A  Physical Exam   GEN: No acute distress.   Neck: No JVD Cardiac: RRR, no murmurs, rubs, or gallops. Surgical scar Respiratory: Clear to auscultation bilaterally. GI: Soft, nontender, non-distended  MS: 1+ edema bilaterally, edema; No deformity. Neuro:  Nonfocal  Psych: Normal affect   Labs    High Sensitivity Troponin:   Recent Labs  Lab 09/16/19 1336 09/16/19 1519  TROPONINIHS 149* 168*      Chemistry Recent Labs  Lab 09/16/19 1336 09/17/19 0303 09/17/19 1945 09/18/19 0404  NA 136 133*  --  131*  K 4.0 4.9  --  4.6  CL 101 100  --  96*  CO2 22 21*  --  20*  GLUCOSE 162* 160*  --  180*  BUN 14 17  --  26*  CREATININE 0.86 0.92 1.15 1.28*  CALCIUM 8.7* 8.6*  --  8.5*  PROT 7.3 7.3  --   --   ALBUMIN 2.8* 2.7*  --   --   AST 26 28  --   --  ALT 16 21  --   --   ALKPHOS 151* 157*  --   --   BILITOT 0.6 0.5  --   --   GFRNONAA >60 >60 >60 59*  GFRAA >60 >60 >60 >60  ANIONGAP 13 12  --  15     Hematology Recent Labs  Lab 09/16/19 1336 09/17/19 0303 09/17/19 1945  WBC 13.0* 11.2* 14.0*  RBC 2.86* 2.78* 2.67*   2.67*  HGB 8.1* 7.8* 7.6*  HCT 27.0* 25.1* 24.2*  MCV 94.4 90.3 90.6  MCH 28.3 28.1 28.5  MCHC 30.0 31.1 31.4  RDW 14.9 15.0 15.2  PLT 630* 648* 689*    BNP Recent Labs  Lab 09/16/19 2057  BNP 451.3*     Radiology    Ct Angio Chest Pe W And/or Wo Contrast  Result Date: 09/16/2019 CLINICAL DATA:  63 year old male with concern for pulmonary embolism. EXAM: CT ANGIOGRAPHY CHEST WITH CONTRAST TECHNIQUE: Multidetector CT imaging of the chest was performed using the standard protocol during bolus administration of intravenous contrast.  Multiplanar CT image reconstructions and MIPs were obtained to evaluate the vascular anatomy. CONTRAST:  171mL OMNIPAQUE IOHEXOL 350 MG/ML SOLN COMPARISON:  Chest CT dated 09/02/2019 FINDINGS: Cardiovascular: Moderate cardiomegaly similar to prior CT. Aortic valve repair/replacement. There is retrograde flow of contrast from the right atrium into the IVC suggestive of a degree of right heart dysfunction. There has been slight interval increase in the amount of pericardial effusion and edema compared to prior CT. A 5.2 x 5.0 cm ill-defined collection anterior and inferior to the heart and posterior to sternotomy (series 5, image 74), likely postsurgical seroma. Attention on follow-up imaging recommended. There is mild atherosclerotic calcification of the aortic arch. No dissection. Similar appearance of low attenuating fluid surrounding the aortic root. No extravasation of contrast. Evaluation of the pulmonary arteries is limited due to respiratory motion artifact and streak artifact caused by patient's arms. There is a small nonocclusive intraluminal hypodensity in the left upper lobe branch (series 6, image 81, sagittal series 11 image 105, and coronal series 10, image 82). This most likely represent an area of scarring and appears to have been present on the prior CT of 09/02/2019 (series 6, image 51). No definite acute large or central pulmonary artery embolus identified. Mediastinum/Nodes: No hilar adenopathy. The esophagus is grossly unremarkable. Postsurgical changes and edema in the anterior mediastinum. Top-normal mediastinal lymph nodes measure up to 11 mm in short axis in the prevascular space, likely reactive. Lungs/Pleura: There is a small left pleural effusion with partial consolidative changes of the left lower lobe which may represent atelectasis versus infiltrate. Overall improved aeration of the left lower lobe as well as left upper lobe compared to the prior CT. Decreased in the right lower lobe  consolidative change since the prior CT with improved aeration of the right lung. There is a trace right pleural effusion significantly decreased in size since the prior CT. Upper Abdomen: There is a 6 mm nonobstructing left renal interpolar calculus. Cholecystectomy. There is a 5.5 cm right renal cyst. Musculoskeletal: Median sternotomy wires. No acute osseous pathology. Review of the MIP images confirms the above findings. IMPRESSION: 1. No definite CT evidence of central pulmonary artery embolus. Small nonocclusive intraluminal hypodensity in the left upper lobe branch most likely represents an area of scarring. 2. Moderate cardiomegaly with evidence of right heart dysfunction. 3. Slight interval increase in the amount of pericardial effusion and edema compared to the prior CT. 4. Overall improved  aeration of the lungs with decrease in the consolidative changes of the lower lobes compared to the prior CT. Significant interval decrease in the size of the right pleural effusion. 5. Aortic Atherosclerosis (ICD10-I70.0). Electronically Signed   By: Anner Crete M.D.   On: 09/16/2019 14:39   Dg Chest Portable 1 View  Result Date: 09/16/2019 CLINICAL DATA:  Status post aortic valve replacement on 10/01 number shortness of breath with chest pressure following breakfast EXAM: PORTABLE CHEST 1 VIEW COMPARISON:  09/10/2019 FINDINGS: No frank interstitial edema. Small left pleural effusion. Associated left lower lobe opacity, likely atelectasis. Possible trace right pleural effusion. No pneumothorax. Cardiomegaly.  Prosthetic aortic valve. Surgical clips along the right lateral chest wall/axilla. Median sternotomy. IMPRESSION: Small left pleural effusion. Associated left lower lobe opacity, likely atelectasis. Electronically Signed   By: Julian Hy M.D.   On: 09/16/2019 14:16   Vas [place] Lower Extremity Venous (dvt) (only [facility])  Result Date: 09/17/2019  Lower Venous Study Indications: Pain, Swelling,  and Recent CABG and aortic root replacement 08/27/19.  Risk Factors: Right GSV harvest. Limitations: Pants on, unable to remove pants secondary to chest pain. Comparison Study: No prior study Performing Technologist: Sharion Dove RVS  Examination Guidelines: A complete evaluation includes B-mode imaging, spectral Doppler, color Doppler, and power Doppler as needed of all accessible portions of each vessel. Bilateral testing is considered an integral part of a complete examination. Limited examinations for reoccurring indications may be performed as noted.  +---------+---------------+---------+-----------+----------+--------------+  RIGHT     Compressibility Phasicity Spontaneity Properties Thrombus Aging  +---------+---------------+---------+-----------+----------+--------------+  CFV                                                        Not visualized  +---------+---------------+---------+-----------+----------+--------------+  SFJ                                                        Not visualized  +---------+---------------+---------+-----------+----------+--------------+  FV Prox   Full                                             pulsatile       +---------+---------------+---------+-----------+----------+--------------+  FV Mid    Full                                                             +---------+---------------+---------+-----------+----------+--------------+  FV Distal Full                                                             +---------+---------------+---------+-----------+----------+--------------+  PFV       Full                                                             +---------+---------------+---------+-----------+----------+--------------+  POP       Full                                             pulsatile       +---------+---------------+---------+-----------+----------+--------------+  PTV       Full                                                              +---------+---------------+---------+-----------+----------+--------------+  PERO      Full                                                             +---------+---------------+---------+-----------+----------+--------------+   Left Technical Findings: Left leg not evaluated.   Summary: Right: There is no evidence of deep vein thrombosis in the lower extremity. However, portions of this examination were limited- see technologist comments above.  *See table(s) above for measurements and observations. Electronically signed by Servando Snare MD on 09/17/2019 at 2:17:07 PM.    Final    Vas [place] Lower Extremity Venous (dvt)  Result Date: 09/17/2019  Lower Venous Study Indications: Swelling, and Edema.  Comparison Study: 09/16/19- previous study Performing Technologist: Abram Sander RVS  Examination Guidelines: A complete evaluation includes B-mode imaging, spectral Doppler, color Doppler, and power Doppler as needed of all accessible portions of each vessel. Bilateral testing is considered an integral part of a complete examination. Limited examinations for reoccurring indications may be performed as noted.  +---------+---------------+---------+-----------+----------+--------------+  LEFT      Compressibility Phasicity Spontaneity Properties Thrombus Aging  +---------+---------------+---------+-----------+----------+--------------+  CFV       Full            Yes       Yes                                    +---------+---------------+---------+-----------+----------+--------------+  SFJ       Full                                                             +---------+---------------+---------+-----------+----------+--------------+  FV Prox   Full                                                             +---------+---------------+---------+-----------+----------+--------------+  FV Mid    Full                                                              +---------+---------------+---------+-----------+----------+--------------+  FV Distal Full                                                             +---------+---------------+---------+-----------+----------+--------------+  PFV       Full                                                             +---------+---------------+---------+-----------+----------+--------------+  POP       Full            Yes       Yes                                    +---------+---------------+---------+-----------+----------+--------------+  PTV       Full                                                             +---------+---------------+---------+-----------+----------+--------------+  PERO      Full                                                             +---------+---------------+---------+-----------+----------+--------------+     Summary: Left: There is no evidence of deep vein thrombosis in the lower extremity. No cystic structure found in the popliteal fossa.  *See table(s) above for measurements and observations. Electronically signed by Deitra Mayo MD on 09/17/2019 at 11:35:06 AM.    Final     Cardiac Studies   Echo  09/18/2019  1. Left ventricular ejection fraction, by visual estimation, is 45 to 50%. The left ventricle has mildly decreased function. There is moderately increased left ventricular hypertrophy.  2. Indeterminate diastolic filling due to E-A fusion pattern of LV diastolic filling.  3. Inferior and inferoseptal hypokinesis  4. Global right ventricle has moderately reduced systolic function.The right ventricular size is small. No increase in right ventricular wall thickness.  5. Hypokinetic right ventricular apex.  6. RV small -apperas to be extrinsically compressed by an echogeneic fluid collection which is anterior and lateral to the RV.  7. Left atrial size was normal.  8. Right atrial size was normal.  9. The tricuspid valve is abnormal. Tricuspid valve regurgitation is  mild. 10. Prosthetic aortic graft in the ascending aorta position. 11. Aortic valve regurgitation was not visualized by color flow Doppler. 12. Aortic valve peak gradient measures 13.1 mmHg. 13. Aortic valve mean gradient measures 7.0 mmHg. 14. 27 mm Konect Resilia Aortic Valve Conduit. 15. The pulmonic valve was grossly normal. Pulmonic valve regurgitation is not visualized by color flow Doppler. 16. The mitral valve is grossly normal. Trace mitral valve regurgitation. 17. The inferior vena cava is dilated  in size with <50% respiratory variability, suggesting right atrial pressure of 15 mmHg. 18. Trivial pericardial effusion is present. 19. No evidence of any gross valvular vegetations.  Patient Profile     63 y.o. male CAD s/p CABG x 2 (SVG-OM, SVG-PDA 08/27/19), AAA, bicuspid aortic valve with severe valvular regrugitation s/p bentall procedure, pulmonary hypertension, former smoker, asthma, HTN, HLD, hypothyroidism, and BPH. He [facility] to [facility] with CP relieved with nitro x 3.   Episode of cough and lung spasm >states these are very brief, but feels tight in his throat  Assessment & Plan    1. Working diagnosis of post cardiotomy syndrome - Elevated inflammatory markers. Seen by Dr. Williemae Natter to use steroids if needed.  - Stopped BB - Echo showed LVEF of 45-50%, moderately reduce RV function. RV small -apperas to be extrinsically compressed by an echogeneic fluid collection which is anterior and lateral to the RV. - Continue Colchicine 0.6mg  BID. No recurrent chest pain. Consider NSAID and steroids if refractory pain.  -  BB (metoprolol)  stopped at clinic visit for concern for asthma exacerbation, lung spasms?. However BP soft today with mild tachycardia in 100s. Follow closely. May need  Low dose metoprolol or bisoprolol. Watch for sign of tamponade.    2. S/PCABG x 2, AAA repair, and AVRon 08/27/19 - Echo as above - Seen by Dr. Orvan Seen this morning   3. Anemia - Hgb  trending down further to 7.6 today from 7.8 - Ferritin 501 with iron of 11 - Transfuse per primary     For questions or updates, please contact [place] Please consult [email or web address] for contact info under        SignedLeanor Kail, PA  09/18/2019, 8:48 AM

## 2019-09-18 NOTE — Progress Notes (Signed)
PROGRESS NOTE  Jeffrey Tucker XHB:[id number] DOB: 12-Oct-1956   PCP: Venia Carbon, MD  Patient is from: Home  DOA: 09/16/2019 LOS: 2  Brief Narrative / Interim history: 63 year old male with history of CAD/CABG x2, AAA repair and AVR (Bentall procedure) on 08/27/2019, pulmonary HTN, former smoker, asthma, HTN, HLD, hypothyroidism and BPH presenting with chest discomfort, generalized weakness, episodic "lung spasm"/orthopnea?, edema, fever to 102 and dry cough.   In ED, HR 110s.  RR in 5s.  BP normal.  Saturation upper 90s on room air.  WBC 13.  Hgb 8.1 (baseline).  Platelets 630.  Mild troponin elevation to 149> 168.  BNP 450.  CRP 16.4.  ESR 89.  TSH 10.  EKG without acute ischemic finding.  CXR with small left pleural effusion and associated LLL opacity.  CTA chest moderate cardiomegaly with evidence of right heart dysfunction, slight interval increase in the amount of pericardial effusion, 5 x 5 cm ill-defined collection anterior inferior to the heart and posterior to sternotomy suggestive for postsurgical seroma, improved aeration of lungs with decrease in consolidative changes of LLE compared to prior CT and no PE.  Cardiology and CTS consulted and patient was admitted for post cardiotomy syndrome.  Subjective: No major events overnight of this morning.  Reports improvement in his breathing.  He says he had 2 episodes of cough and "abdominal spasm" overnight.  Denies chest pain, palpitation or lightheadedness..  Denies nausea or vomiting.  Denies hematochezia or melena.  Objective: Vitals:   09/18/19 1113 09/18/19 1200 09/18/19 1359 09/18/19 1440  BP: 102/82 102/85 117/84 113/81  Pulse: (!) 102 99 95 97  Resp: (!) 28 (!) 22 19 (!) 29  Temp: 98.2 F (36.8 C)  98.4 F (36.9 C) 98.4 F (36.9 C)  TempSrc: Oral  Oral Oral  SpO2: 97% 96% 100% 97%  Weight:      Height:        Intake/Output Summary (Last 24 hours) at 09/18/2019 1444 Last data filed at 09/18/2019 0600 Gross per 24  hour  Intake 0 ml  Output 300 ml  Net -300 ml   Filed Weights   09/16/19 1301 09/16/19 2223 09/17/19 0449  Weight: 68 kg 69.6 kg 69.5 kg    Examination:  GENERAL: No acute distress.  Sitting on bedside chair. HEENT: MMM.  Vision and hearing grossly intact.  NECK: Supple.  No apparent JVD.  RESP:  No IWOB.  Fair aeration bilaterally.  No wheeze or crackles. CVS:  RRR. Heart sounds normal.  ABD/GI/GU: Bowel sounds present. Soft.  1+ edema bilaterally, right> left. MSK/EXT:  Moves extremities.  1+ edema bilaterally SKIN: Sternotomy wound clean and healing. NEURO: Awake, alert and oriented appropriately.  No gross deficit.  PSYCH: Calm. Normal affect.   Assessment & Plan: Chest discomfort/dyspnea/"lung/abdominal spasm": unclear etiology of this.  Differential diagnosis include post cardiotomy syndrome/Dressler syndrome, pneumonia, pericarditis, CHF exacerbation, symptomatic anemia, anxiety and asthma.  Patient has fever, chills, leukocytosis, elevated inflammatory markers and some LLL consolidative changes on CT although improved concerning for PNA.  With pericardial effusion, concern about pericarditis as well. He also have orthopnea, edema,  elevated BNP and troponin concerning for CHF.  Echo as below. -Appreciate cardiology and CTS guidance. -Continue colchicine 0.6 mg twice daily.  Already on full dose aspirin.  Doubt need for steroid given clinical improvement. -Ceftriaxone and azithromycin 10/22>> -Check procalcitonin-we will discontinue antibiotics if negative. -Budesonide, DuoNeb, PPI, as needed Ativan  Systolic YBO:FBPZ with EF of 45 to 50%, moderate  LVH, moderately reduced RV EF, fluid collection compressing on RV.  Slight creatinine bump with IV Lasix.  Soft blood pressures. -Per cardiology. -Metoprolol resumed. -Monitor fluid status and renal function  CAD/CABG x2, AAA repair and AVR on 08/27/2019: CTA negative for PE but some pericardial effusion.  No signs of tamponade.   Echocardiogram as above.  Metoprolol discontinued in clinic due to "lung spasm" -Continue statins. -Soft blood pressures to reinitiate BB. -Per cardiology  Acute/subacute blood loss anemia: Hgb 8.1> 7.8> 7.6.  Baseline 7-8.  No signs of active bleed.  Anemia panel consistent with iron deficiency. -Transfuse 1 unit to keep Hgb greater than 8 given CAD -We will transfuse Feraheme after blood transfusion.  AKI: Likely due to IV Lasix and possibly soft blood pressures.  Off Lasix now. -Recheck in the morning.  Mild hyponatremia: Has some edema but no overt fluid overload. -Recheck in the morning.  Right lower extremity swelling s/p SVG harvest.  Bilateral Doppler negative for DVT.  No calf tenderness. -Encourage leg elevation -We will try TED hose.  Mild intermittent asthma: No wheezing.  Unclear explanation for his "lung spasm" -Breathing treatments as above. -Continue Tessalon Perles  Essential hypertension: Normotensive but soft. -Per cardiology.  Hypothyroidism: TSH marginally elevated.  -Continue home Synthroid -Recheck TSH in 4 to 6 weeks outpatient.   Thrombocytosis: Reactive? -Continue monitoring.  Mildly elevated alkaline phosphatase -Continue trending.  DVT prophylaxis: On subcu Lovenox Code Status: Full code Family Communication: Updated patient's wife over the phone Disposition Plan: Remains inpatient Consultants: Cardiology, CTS  Procedures:  None  Microbiology summarized: [id number] negative. Blood cultures negative so far.  Sch Meds:  Scheduled Meds: . aspirin  325 mg Oral Daily  . atorvastatin  80 mg Oral q1800  . azithromycin  250 mg Oral Daily  . benzonatate  100 mg Oral TID  . budesonide (PULMICORT) nebulizer solution  0.5 mg Nebulization BID  . colchicine  0.6 mg Oral BID  . enoxaparin (LOVENOX) injection  40 mg Subcutaneous Q24H  . ferrous sulfate  325 mg Oral Q breakfast  . ipratropium-albuterol  3 mL Nebulization BID  . levothyroxine  125  mcg Oral Daily  . pantoprazole  40 mg Oral Daily   Continuous Infusions: . cefTRIAXone (ROCEPHIN)  IV 2 g (09/18/19 1224)   PRN Meds:.acetaminophen **OR** acetaminophen, ipratropium-albuterol, LORazepam, morphine injection, ondansetron **OR** ondansetron (ZOFRAN) IV, polyethylene glycol, traMADol  Antimicrobials: Anti-infectives (From admission, onward)   Start     Dose/Rate Route Frequency Ordered Stop   09/18/19 1000  azithromycin (ZITHROMAX) tablet 250 mg     250 mg Oral Daily 09/17/19 1208 09/22/19 0959   09/17/19 1300  cefTRIAXone (ROCEPHIN) 2 g in sodium chloride 0.9 % 100 mL IVPB     2 g 200 mL/hr over 30 Minutes Intravenous Every 24 hours 09/17/19 1208     09/17/19 1215  azithromycin (ZITHROMAX) tablet 500 mg     500 mg Oral Daily 09/17/19 1208 09/17/19 1319       I have personally reviewed the following labs and images: CBC: Recent Labs  Lab 09/15/19 0911 09/16/19 1336 09/17/19 0303 09/17/19 1945  WBC 7.7 13.0* 11.2* 14.0*  NEUTROABS  --  10.9*  --   --   HGB 8.1* 8.1* 7.8* 7.6*  HCT 26.6* 27.0* 25.1* 24.2*  MCV 92.7 94.4 90.3 90.6  PLT 629* 630* 648* 689*   BMP &GFR Recent Labs  Lab 09/15/19 0911 09/16/19 1336 09/16/19 2057 09/17/19 0303 09/17/19 1945 09/18/19 0404  NA  136 136  --  133*  --  131*  K 4.1 4.0  --  4.9  --  4.6  CL 101 101  --  100  --  96*  CO2 25 22  --  21*  --  20*  GLUCOSE 148* 162*  --  160*  --  180*  BUN 16 14  --  17  --  26*  CREATININE 0.87 0.86  --  0.92 1.15 1.28*  CALCIUM 8.7* 8.7*  --  8.6*  --  8.5*  MG  --   --  2.1  --   --  2.1  PHOS  --   --  5.2*  --   --   --    Estimated Creatinine Clearance: 53.3 mL/min (A) (by C-G formula based on SCr of 1.28 mg/dL (H)). Liver & Pancreas: Recent Labs  Lab 09/16/19 1336 09/17/19 0303  AST 26 28  ALT 16 21  ALKPHOS 151* 157*  BILITOT 0.6 0.5  PROT 7.3 7.3  ALBUMIN 2.8* 2.7*   No results for input(s): LIPASE, AMYLASE in the last 168 hours. No results for input(s):  AMMONIA in the last 168 hours. Diabetic: No results for input(s): HGBA1C in the last 72 hours. No results for input(s): GLUCAP in the last 168 hours. Cardiac Enzymes: No results for input(s): CKTOTAL, CKMB, CKMBINDEX, TROPONINI in the last 168 hours. No results for input(s): PROBNP in the last 8760 hours. Coagulation Profile: No results for input(s): INR, PROTIME in the last 168 hours. Thyroid Function Tests: Recent Labs    09/16/19 2057  TSH 10.228*   Lipid Profile: No results for input(s): CHOL, HDL, LDLCALC, TRIG, CHOLHDL, LDLDIRECT in the last 72 hours. Anemia Panel: Recent Labs    09/17/19 1945  VITAMINB12 317  FOLATE 8.8  FERRITIN 501*  TIBC 252  IRON 11*  RETICCTPCT 2.6   Urine analysis:    Component Value Date/Time   COLORURINE YELLOW 08/25/2019 0902   APPEARANCEUR CLEAR 08/25/2019 0902   APPEARANCEUR Cloudy (A) 06/19/2017 0828   LABSPEC 1.021 08/25/2019 0902   PHURINE 5.0 08/25/2019 0902   GLUCOSEU NEGATIVE 08/25/2019 0902   HGBUR NEGATIVE 08/25/2019 0902   BILIRUBINUR NEGATIVE 08/25/2019 0902   BILIRUBINUR Negative 06/19/2017 0828   KETONESUR NEGATIVE 08/25/2019 0902   PROTEINUR NEGATIVE 08/25/2019 0902   NITRITE NEGATIVE 08/25/2019 0902   LEUKOCYTESUR NEGATIVE 08/25/2019 0902   Sepsis Labs: Invalid input(s): PROCALCITONIN, [place]  Microbiology: Recent Results (from the past 240 hour(s))  Novel Coronavirus, NAA ([facility])     Status: None   Collection Time: 09/15/19 10:50 AM   Specimen: Oropharyngeal(OP) collection in vial transport medium   OROPHARYNGEA  TESTING  Result Value Ref Range Status   SARS-CoV-2, NAA Not Detected Not Detected Final    Comment: This nucleic acid amplification test was developed and its performance characteristics determined by [employer]. Nucleic acid amplification tests include PCR and TMA. This test has not been FDA cleared or approved. This test has been authorized by FDA under an Emergency Use  Authorization (EUA). This test is only authorized for the duration of time the declaration that circumstances exist justifying the authorization of the emergency use of in vitro diagnostic tests for detection of SARS-CoV-2 virus and/or diagnosis of COVID-19 infection under section 564(b)(1) of the Act, 21 U.S.C. 300TMA-2(Q) (1), unless the authorization is terminated or revoked sooner. When diagnostic testing is negative, the possibility of a false negative result should be considered in the context of a patient's  recent exposures and the presence of clinical signs and symptoms consistent with COVID-19. An individual without symptoms of COVID-19 and who is not shedding SARS-CoV-2 virus would  expect to have a negative (not detected) result in this assay.   SARS CORONAVIRUS 2 (TAT 6-24 HRS) Nasopharyngeal Nasopharyngeal Swab     Status: None   Collection Time: 09/16/19  6:00 PM   Specimen: Nasopharyngeal Swab  Result Value Ref Range Status   SARS Coronavirus 2 NEGATIVE NEGATIVE Final    Comment: (NOTE) SARS-CoV-2 target nucleic acids are NOT DETECTED. The SARS-CoV-2 RNA is generally detectable in upper and lower respiratory specimens during the acute phase of infection. Negative results do not preclude SARS-CoV-2 infection, do not rule out co-infections with other pathogens, and should not be used as the sole basis for treatment or other patient management decisions. Negative results must be combined with clinical observations, patient history, and epidemiological information. The expected result is Negative. Fact Sheet for Patients: SugarRoll.be Fact Sheet for Healthcare Providers: https://www.woods-mathews.com/ This test is not yet approved or cleared by the [place] FDA and  has been authorized for detection and/or diagnosis of SARS-CoV-2 by FDA under an Emergency Use Authorization (EUA). This EUA will remain  in effect (meaning this  test can be used) for the duration of the COVID-19 declaration under Section 56 4(b)(1) of the Act, 21 U.S.C. section 360bbb-3(b)(1), unless the authorization is terminated or revoked sooner. Performed at [facility], [place] [address], [place], [place] [address]   Culture, blood (routine x 2)     Status: None (Preliminary result)   Collection Time: 09/16/19  8:57 PM   Specimen: BLOOD  Result Value Ref Range Status   Specimen Description BLOOD RIGHT ANTECUBITAL  Final   Special Requests   Final    BOTTLES DRAWN AEROBIC AND ANAEROBIC Blood Culture adequate volume   Culture   Final    NO GROWTH 2 DAYS Performed at [facility], [place] [address] [place], [place] [address]    Report Status PENDING  Incomplete  Culture, blood (routine x 2)     Status: None (Preliminary result)   Collection Time: 09/16/19  9:08 PM   Specimen: BLOOD RIGHT HAND  Result Value Ref Range Status   Specimen Description BLOOD RIGHT HAND  Final   Special Requests   Final    BOTTLES DRAWN AEROBIC AND ANAEROBIC Blood Culture results may not be optimal due to an inadequate volume of blood received in culture bottles   Culture   Final    NO GROWTH 2 DAYS Performed at [facility], [place] [address] [place], [place] [address]    Report Status PENDING  Incomplete    Radiology Studies: No results found.  Denice Cardon T. Sanford  If 7PM-7AM, please contact night-coverage [email or web address] Password [email or web address] 09/18/2019, 2:44 PM

## 2019-09-19 DIAGNOSIS — D62 Acute posthemorrhagic anemia: Secondary | ICD-10-CM

## 2019-09-19 DIAGNOSIS — D649 Anemia, unspecified: Secondary | ICD-10-CM

## 2019-09-19 DIAGNOSIS — I25708 Atherosclerosis of coronary artery bypass graft(s), unspecified, with other forms of angina pectoris: Secondary | ICD-10-CM

## 2019-09-19 LAB — BPAM RBC
Blood Product Expiration Date: 202011122359
ISSUE DATE / TIME: 202010231413
Unit Type and Rh: 6200

## 2019-09-19 LAB — TYPE AND SCREEN
ABO/RH(D): A POS
Antibody Screen: NEGATIVE
Unit division: 0

## 2019-09-19 LAB — CBC
HCT: 24.7 % — ABNORMAL LOW (ref 39.0–52.0)
HCT: 25.4 % — ABNORMAL LOW (ref 39.0–52.0)
Hemoglobin: 7.8 g/dL — ABNORMAL LOW (ref 13.0–17.0)
Hemoglobin: 8.1 g/dL — ABNORMAL LOW (ref 13.0–17.0)
MCH: 28.5 pg (ref 26.0–34.0)
MCH: 28.8 pg (ref 26.0–34.0)
MCHC: 31.6 g/dL (ref 30.0–36.0)
MCHC: 31.9 g/dL (ref 30.0–36.0)
MCV: 90.1 fL (ref 80.0–100.0)
MCV: 90.4 fL (ref 80.0–100.0)
Platelets: 519 10*3/uL — ABNORMAL HIGH (ref 150–400)
Platelets: 529 10*3/uL — ABNORMAL HIGH (ref 150–400)
RBC: 2.74 MIL/uL — ABNORMAL LOW (ref 4.22–5.81)
RBC: 2.81 MIL/uL — ABNORMAL LOW (ref 4.22–5.81)
RDW: 15.1 % (ref 11.5–15.5)
RDW: 15.1 % (ref 11.5–15.5)
WBC: 8.9 10*3/uL (ref 4.0–10.5)
WBC: 9.3 10*3/uL (ref 4.0–10.5)
nRBC: 0 % (ref 0.0–0.2)
nRBC: 0 % (ref 0.0–0.2)

## 2019-09-19 LAB — BASIC METABOLIC PANEL
Anion gap: 11 (ref 5–15)
BUN: 24 mg/dL — ABNORMAL HIGH (ref 8–23)
CO2: 23 mmol/L (ref 22–32)
Calcium: 8.4 mg/dL — ABNORMAL LOW (ref 8.9–10.3)
Chloride: 100 mmol/L (ref 98–111)
Creatinine, Ser: 0.91 mg/dL (ref 0.61–1.24)
GFR calc Af Amer: >60 mL/min (ref >60)
GFR calc non Af Amer: >60 mL/min (ref >60)
Glucose, Bld: 118 mg/dL — ABNORMAL HIGH (ref 70–99)
Potassium: 3.6 mmol/L (ref 3.5–5.1)
Sodium: 134 mmol/L — ABNORMAL LOW (ref 135–145)

## 2019-09-19 LAB — OCCULT BLOOD X 1 CARD TO LAB, STOOL: Fecal Occult Bld: NEGATIVE

## 2019-09-19 LAB — MAGNESIUM: Magnesium: 2.2 mg/dL (ref 1.7–2.4)

## 2019-09-19 LAB — PROCALCITONIN: Procalcitonin: 0.72 ng/mL

## 2019-09-19 MED ORDER — PANTOPRAZOLE SODIUM 40 MG PO TBEC
40.0000 mg | DELAYED_RELEASE_TABLET | Freq: Two times a day (BID) | ORAL | Status: DC
  Administered 2019-09-19 – 2019-09-24 (×11): 40 mg via ORAL
  Filled 2019-09-19 (×11): qty 1

## 2019-09-19 MED ORDER — SODIUM CHLORIDE 0.9 % IV SOLN
510.0000 mg | Freq: Once | INTRAVENOUS | Status: AC
  Administered 2019-09-19: 510 mg via INTRAVENOUS
  Filled 2019-09-19: qty 17

## 2019-09-19 NOTE — Progress Notes (Addendum)
PROGRESS NOTE  Jeffrey Tucker UXN:[id number] DOB: 30-Mar-1956   PCP: Venia Carbon, MD  Patient is from: Home  DOA: 09/16/2019 LOS: 3  Brief Narrative / Interim history: 63 year old male with history of CAD/CABG x2, AAA repair and AVR (Bentall procedure) on 08/27/2019, pulmonary HTN, former smoker, asthma, HTN, HLD, hypothyroidism and BPH presenting with chest discomfort, generalized weakness, episodic "lung spasm"/orthopnea?, edema, fever to 102 and dry cough.   In ED, HR 110s.  RR in 52s.  BP normal.  Saturation upper 90s on room air.  WBC 13.  Hgb 8.1 (baseline).  Platelets 630.  Mild troponin elevation to 149> 168.  BNP 450.  CRP 16.4.  ESR 89.  TSH 10.  EKG without acute ischemic finding.  CXR with small left pleural effusion and associated LLL opacity.  CTA chest moderate cardiomegaly with evidence of right heart dysfunction, slight interval increase in the amount of pericardial effusion, 5 x 5 cm ill-defined collection anterior inferior to the heart and posterior to sternotomy suggestive for postsurgical seroma, improved aeration of lungs with decrease in consolidative changes of LLE compared to prior CT and no PE.  Cardiology and CTS consulted and patient was admitted for post cardiotomy syndrome.  Subjective: No major events overnight of this morning.  Reports having a better night.  Has not had further "lung spasm".  Still with some dry cough.  Denies chest pain.  Reports improvement in his breathing.  Denies abdominal pain, nausea and vomiting.  Reports having loose bowel movement.  Denies melena or hematochezia.  Objective: Vitals:   09/19/19 0400 09/19/19 0827 09/19/19 0943 09/19/19 1137  BP: 109/76 100/83  112/87  Pulse: 93 97  95  Resp: (!) 25 (!) 24  (!) 26  Temp: 98.6 F (37 C) (!) 97.5 F (36.4 C)  98.2 F (36.8 C)  TempSrc: Oral Oral  Oral  SpO2: 91% 93% 94% 100%  Weight:      Height:        Intake/Output Summary (Last 24 hours) at 09/19/2019 1234 Last data  filed at 09/18/2019 1500 Gross per 24 hour  Intake 345 ml  Output   Net 345 ml   Filed Weights   09/16/19 1301 09/16/19 2223 09/17/19 0449  Weight: 68 kg 69.6 kg 69.5 kg    Examination:  GENERAL: No acute distress.  Lying in bed. HEENT: MMM.  Vision and hearing grossly intact.  NECK: Supple.  No apparent JVD.  RESP:  No IWOB.  Fair aeration bilaterally.  No wheeze or crackles. CVS:  RRR. Heart sounds normal.  ABD/GI/GU: Bowel sounds present. Soft. Non tender.  MSK/EXT:  Moves extremities. No apparent deformity.  Trace edema. SKIN: no apparent skin lesion or wound NEURO: Awake, alert and oriented appropriately.  No gross deficit.  PSYCH: Calm. Normal affect.   Assessment & Plan: Chest discomfort/dyspnea/"lung spasm": unclear etiology of this.  Differential diagnosis include post cardiotomy syndrome/Dressler syndrome, pneumonia, pericarditis, CHF exacerbation, symptomatic anemia, anxiety and asthma.  Patient has had fever, chills, leukocytosis, elevated inflammatory markers and some LLL consolidative changes on CT although improved concerning for PNA.  PCT elevated.  With pericardial effusion, concern about pericarditis as well. He also have orthopnea, edema,  elevated BNP and troponin concerning for CHF.  Echo as below. -Appreciate cardiology and CTS guidance. -Continue colchicine 0.6 mg twice daily.  Okay to use steroid if needed per CTS. -Ceftriaxone and azithromycin 10/22>> -Budesonide, DuoNeb, PPI, as needed Ativan  Acute Systolic URK:YHCW with EF of 45 to  50%, moderate LVH, moderately reduced RV EF, fluid collection compressing on RV.  Received IV Lasix 40 mg once on 10/22.  Cr bumped up.  Soft blood pressures.  Lasix discontinued. -Per cardiology. -Monitor fluid status and renal function  CAD/CABG x2, AAA repair and AVR on 08/27/2019: CTA negative for PE but some pericardial effusion.  No signs of tamponade.  Echocardiogram as above.  Metoprolol discontinued in clinic due to  "lung spasm" -Continue statins. -Soft blood pressures to reinitiate BB. -Cardiology signed off. -Hold aspirin  Acute/subacute blood loss anemia: Hgb 8.1>> 7.6>1u>8.5>7.8.  Baseline 7-8.  No signs of active bleed.  Anemia panel consistent with iron deficiency.  FOBT negative x2 -Recheck CBC in the afternoon -Infuse Feraheme 510 mg once -Continue p.o. iron -Hold aspirin and subcu Lovenox  AKI: Resolved.  Likely due to IV Lasix. -Recheck in the morning.  Mild hyponatremia: Improved. -Continue monitoring  Right lower extremity swelling s/p SVG harvest.  Bilateral Doppler negative for DVT.  No calf tenderness. -Encourage leg elevation -We will try TED hose.  Mild intermittent asthma: No wheezing.  Unclear explanation for his "lung spasm" but improving.  Phrenic nerve irritation? -Breathing treatments as above. -Continue Tessalon Perles  Essential hypertension: Normotensive. -Per cardiology.  Hypothyroidism: TSH marginally elevated.  -Continue home Synthroid -Recheck TSH in 4 to 6 weeks outpatient.   Thrombocytosis: Reactive? -Continue monitoring.  Mildly elevated alkaline phosphatase -Continue trending.  DVT prophylaxis: SCD Code Status: Full code Family Communication: Updated patient's wife over the phone Disposition Plan: Remains inpatient Consultants: Cardiology, CTS  Procedures:  None  Microbiology summarized: [id number] negative. Blood cultures negative so far.  Sch Meds:  Scheduled Meds:  atorvastatin  80 mg Oral q1800   azithromycin  250 mg Oral Daily   benzonatate  100 mg Oral TID   budesonide (PULMICORT) nebulizer solution  0.5 mg Nebulization BID   colchicine  0.6 mg Oral BID   ferrous sulfate  325 mg Oral Q breakfast   ipratropium-albuterol  3 mL Nebulization BID   levothyroxine  125 mcg Oral Daily   pantoprazole  40 mg Oral BID   Continuous Infusions:  cefTRIAXone (ROCEPHIN)  IV Stopped (09/18/19 1258)   PRN Meds:.acetaminophen **OR**  acetaminophen, ipratropium-albuterol, LORazepam, morphine injection, ondansetron **OR** ondansetron (ZOFRAN) IV, polyethylene glycol, traMADol  Antimicrobials: Anti-infectives (From admission, onward)    Start     Dose/Rate Route Frequency Ordered Stop   09/18/19 1000  azithromycin (ZITHROMAX) tablet 250 mg     250 mg Oral Daily 09/17/19 1208 09/22/19 0959   09/17/19 1300  cefTRIAXone (ROCEPHIN) 2 g in sodium chloride 0.9 % 100 mL IVPB     2 g 200 mL/hr over 30 Minutes Intravenous Every 24 hours 09/17/19 1208     09/17/19 1215  azithromycin (ZITHROMAX) tablet 500 mg     500 mg Oral Daily 09/17/19 1208 09/17/19 1319        I have personally reviewed the following labs and images: CBC: Recent Labs  Lab 09/15/19 0911 09/16/19 1336 09/17/19 0303 09/17/19 1945 09/18/19 2110 09/19/19 0312  WBC 7.7 13.0* 11.2* 14.0*  --  8.9  NEUTROABS  --  10.9*  --   --   --   --   HGB 8.1* 8.1* 7.8* 7.6* 8.5* 7.8*  HCT 26.6* 27.0* 25.1* 24.2* 26.9* 24.7*  MCV 92.7 94.4 90.3 90.6  --  90.1  PLT 629* 630* 648* 689*  --  519*   BMP &GFR Recent Labs  Lab 09/15/19 0911 09/16/19 1336  09/16/19 2057 09/17/19 0303 09/17/19 1945 09/18/19 0404 09/19/19 0312  NA 136 136  --  133*  --  131* 134*  K 4.1 4.0  --  4.9  --  4.6 3.6  CL 101 101  --  100  --  96* 100  CO2 25 22  --  21*  --  20* 23  GLUCOSE 148* 162*  --  160*  --  180* 118*  BUN 16 14  --  17  --  26* 24*  CREATININE 0.87 0.86  --  0.92 1.15 1.28* 0.91  CALCIUM 8.7* 8.7*  --  8.6*  --  8.5* 8.4*  MG  --   --  2.1  --   --  2.1 2.2  PHOS  --   --  5.2*  --   --   --   --    Estimated Creatinine Clearance: 75 mL/min (by C-G formula based on SCr of 0.91 mg/dL). Liver & Pancreas: Recent Labs  Lab 09/16/19 1336 09/17/19 0303  AST 26 28  ALT 16 21  ALKPHOS 151* 157*  BILITOT 0.6 0.5  PROT 7.3 7.3  ALBUMIN 2.8* 2.7*   No results for input(s): LIPASE, AMYLASE in the last 168 hours. No results for input(s): AMMONIA in the last  168 hours. Diabetic: No results for input(s): HGBA1C in the last 72 hours. No results for input(s): GLUCAP in the last 168 hours. Cardiac Enzymes: No results for input(s): CKTOTAL, CKMB, CKMBINDEX, TROPONINI in the last 168 hours. No results for input(s): PROBNP in the last 8760 hours. Coagulation Profile: No results for input(s): INR, PROTIME in the last 168 hours. Thyroid Function Tests: Recent Labs    09/16/19 2057  TSH 10.228*   Lipid Profile: No results for input(s): CHOL, HDL, LDLCALC, TRIG, CHOLHDL, LDLDIRECT in the last 72 hours. Anemia Panel: Recent Labs    09/17/19 1945  VITAMINB12 317  FOLATE 8.8  FERRITIN 501*  TIBC 252  IRON 11*  RETICCTPCT 2.6   Urine analysis:    Component Value Date/Time   COLORURINE YELLOW 08/25/2019 0902   APPEARANCEUR CLEAR 08/25/2019 0902   APPEARANCEUR Cloudy (A) 06/19/2017 0828   LABSPEC 1.021 08/25/2019 0902   PHURINE 5.0 08/25/2019 0902   GLUCOSEU NEGATIVE 08/25/2019 0902   HGBUR NEGATIVE 08/25/2019 0902   BILIRUBINUR NEGATIVE 08/25/2019 0902   BILIRUBINUR Negative 06/19/2017 0828   KETONESUR NEGATIVE 08/25/2019 0902   PROTEINUR NEGATIVE 08/25/2019 0902   NITRITE NEGATIVE 08/25/2019 0902   LEUKOCYTESUR NEGATIVE 08/25/2019 0902   Sepsis Labs: Invalid input(s): PROCALCITONIN, [place]  Microbiology: Recent Results (from the past 240 hour(s))  Novel Coronavirus, NAA ([facility])     Status: None   Collection Time: 09/15/19 10:50 AM   Specimen: Oropharyngeal(OP) collection in vial transport medium   OROPHARYNGEA  TESTING  Result Value Ref Range Status   SARS-CoV-2, NAA Not Detected Not Detected Final    Comment: This nucleic acid amplification test was developed and its performance characteristics determined by [employer]. Nucleic acid amplification tests include PCR and TMA. This test has not been FDA cleared or approved. This test has been authorized by FDA under an Emergency Use Authorization (EUA). This  test is only authorized for the duration of time the declaration that circumstances exist justifying the authorization of the emergency use of in vitro diagnostic tests for detection of SARS-CoV-2 virus and/or diagnosis of COVID-19 infection under section 564(b)(1) of the Act, 21 U.S.C. 062BJS-2(G) (1), unless the authorization is terminated or revoked  sooner. When diagnostic testing is negative, the possibility of a false negative result should be considered in the context of a patient's recent exposures and the presence of clinical signs and symptoms consistent with COVID-19. An individual without symptoms of COVID-19 and who is not shedding SARS-CoV-2 virus would  expect to have a negative (not detected) result in this assay.   SARS CORONAVIRUS 2 (TAT 6-24 HRS) Nasopharyngeal Nasopharyngeal Swab     Status: None   Collection Time: 09/16/19  6:00 PM   Specimen: Nasopharyngeal Swab  Result Value Ref Range Status   SARS Coronavirus 2 NEGATIVE NEGATIVE Final    Comment: (NOTE) SARS-CoV-2 target nucleic acids are NOT DETECTED. The SARS-CoV-2 RNA is generally detectable in upper and lower respiratory specimens during the acute phase of infection. Negative results do not preclude SARS-CoV-2 infection, do not rule out co-infections with other pathogens, and should not be used as the sole basis for treatment or other patient management decisions. Negative results must be combined with clinical observations, patient history, and epidemiological information. The expected result is Negative. Fact Sheet for Patients: SugarRoll.be Fact Sheet for Healthcare Providers: https://www.woods-mathews.com/ This test is not yet approved or cleared by the [place] FDA and  has been authorized for detection and/or diagnosis of SARS-CoV-2 by FDA under an Emergency Use Authorization (EUA). This EUA will remain  in effect (meaning this test can be used) for the  duration of the COVID-19 declaration under Section 56 4(b)(1) of the Act, 21 U.S.C. section 360bbb-3(b)(1), unless the authorization is terminated or revoked sooner. Performed at [facility], [place] [address] [place], [place] [address]   Culture, blood (routine x 2)     Status: None (Preliminary result)   Collection Time: 09/16/19  8:57 PM   Specimen: BLOOD  Result Value Ref Range Status   Specimen Description BLOOD RIGHT ANTECUBITAL  Final   Special Requests   Final    BOTTLES DRAWN AEROBIC AND ANAEROBIC Blood Culture adequate volume   Culture   Final    NO GROWTH 3 DAYS Performed at [facility], [place] [address], [place], [place] [address]    Report Status PENDING  Incomplete  Culture, blood (routine x 2)     Status: None (Preliminary result)   Collection Time: 09/16/19  9:08 PM   Specimen: BLOOD RIGHT HAND  Result Value Ref Range Status   Specimen Description BLOOD RIGHT HAND  Final   Special Requests   Final    BOTTLES DRAWN AEROBIC AND ANAEROBIC Blood Culture results may not be optimal due to an inadequate volume of blood received in culture bottles   Culture   Final    NO GROWTH 3 DAYS Performed at [facility], [place] [address], [place], [place] [address]    Report Status PENDING  Incomplete    Radiology Studies: No results found.  Adaleena Mooers T. Dilkon  If 7PM-7AM, please contact night-coverage [email or web address] Password [facility] 09/19/2019, 12:34 PM

## 2019-09-19 NOTE — Progress Notes (Signed)
Lung/chest spasms x 3 on 7am - 7pm shift.  No changes in VS during events.

## 2019-09-19 NOTE — Progress Notes (Signed)
Progress Note  Patient Name: Jeffrey Tucker Date of Encounter: 09/19/2019  Primary Cardiologist: Ida Rogue, MD   Subjective   Denies chest pain.  Had some diarrhea earlier.  Overall he feels better than he did yesterday.  Inpatient Medications    Scheduled Meds:  atorvastatin  80 mg Oral q1800   azithromycin  250 mg Oral Daily   benzonatate  100 mg Oral TID   budesonide (PULMICORT) nebulizer solution  0.5 mg Nebulization BID   colchicine  0.6 mg Oral BID   ferrous sulfate  325 mg Oral Q breakfast   ipratropium-albuterol  3 mL Nebulization BID   levothyroxine  125 mcg Oral Daily   pantoprazole  40 mg Oral BID   Continuous Infusions:  cefTRIAXone (ROCEPHIN)  IV Stopped (09/18/19 1258)   PRN Meds: acetaminophen **OR** acetaminophen, ipratropium-albuterol, LORazepam, morphine injection, ondansetron **OR** ondansetron (ZOFRAN) IV, polyethylene glycol, traMADol   Vital Signs    Vitals:   09/19/19 0000 09/19/19 0400 09/19/19 0827 09/19/19 0943  BP: 108/79 109/76 100/83   Pulse: 93 93 97   Resp: (!) 28 (!) 25 (!) 24   Temp:  98.6 F (37 C) (!) 97.5 F (36.4 C)   TempSrc:  Oral Oral   SpO2: 90% 91% 93% 94%  Weight:      Height:        Intake/Output Summary (Last 24 hours) at 09/19/2019 1129 Last data filed at 09/18/2019 1500 Gross per 24 hour  Intake 345 ml  Output --  Net 345 ml   Filed Weights   09/16/19 1301 09/16/19 2223 09/17/19 0449  Weight: 68 kg 69.6 kg 69.5 kg    Telemetry    Sinus rhythm, 90 bpm range, isolated PVCs- Personally Reviewed   Physical Exam   GEN: No acute distress.   Neck: No JVD Cardiac: RRR, no murmurs, rubs, or gallops.  Respiratory: Clear to auscultation bilaterally. GI: Soft, nontender, non-distended  MS: No edema; No deformity. Neuro:  Nonfocal  Psych: Normal affect   Labs    Chemistry Recent Labs  Lab 09/16/19 1336 09/17/19 0303 09/17/19 1945 09/18/19 0404 09/19/19 0312  NA 136 133*  --  131*  134*  K 4.0 4.9  --  4.6 3.6  CL 101 100  --  96* 100  CO2 22 21*  --  20* 23  GLUCOSE 162* 160*  --  180* 118*  BUN 14 17  --  26* 24*  CREATININE 0.86 0.92 1.15 1.28* 0.91  CALCIUM 8.7* 8.6*  --  8.5* 8.4*  PROT 7.3 7.3  --   --   --   ALBUMIN 2.8* 2.7*  --   --   --   AST 26 28  --   --   --   ALT 16 21  --   --   --   ALKPHOS 151* 157*  --   --   --   BILITOT 0.6 0.5  --   --   --   GFRNONAA >60 >60 >60 59* >60  GFRAA >60 >60 >60 >60 >60  ANIONGAP 13 12  --  15 11     Hematology Recent Labs  Lab 09/17/19 0303 09/17/19 1945 09/18/19 2110 09/19/19 0312  WBC 11.2* 14.0*  --  8.9  RBC 2.78* 2.67*   2.67*  --  2.74*  HGB 7.8* 7.6* 8.5* 7.8*  HCT 25.1* 24.2* 26.9* 24.7*  MCV 90.3 90.6  --  90.1  MCH 28.1 28.5  --  28.5  MCHC 31.1 31.4  --  31.6  RDW 15.0 15.2  --  15.1  PLT 648* 689*  --  519*    Cardiac EnzymesNo results for input(s): TROPONINI in the last 168 hours. No results for input(s): TROPIPOC in the last 168 hours.   BNP Recent Labs  Lab 09/16/19 2057  BNP 451.3*     DDimer No results for input(s): DDIMER in the last 168 hours.   Radiology    No results found.  Cardiac Studies   Echo  09/18/2019 1. Left ventricular ejection fraction, by visual estimation, is 45 to 50%. The left ventricle has mildly decreased function. There is moderately increased left ventricular hypertrophy. 2. Indeterminate diastolic filling due to E-A fusion pattern of LV diastolic filling. 3. Inferior and inferoseptal hypokinesis 4. Global right ventricle has moderately reduced systolic function.The right ventricular size is small. No increase in right ventricular wall thickness. 5. Hypokinetic right ventricular apex. 6. RV small -apperas to be extrinsically compressed by an echogeneic fluid collection which is anterior and lateral to the RV. 7. Left atrial size was normal. 8. Right atrial size was normal. 9. The tricuspid valve is abnormal. Tricuspid valve  regurgitation is mild. 10. Prosthetic aortic graft in the ascending aorta position. 11. Aortic valve regurgitation was not visualized by color flow Doppler. 12. Aortic valve peak gradient measures 13.1 mmHg. 13. Aortic valve mean gradient measures 7.0 mmHg. 14. 27 mm Konect Resilia Aortic Valve Conduit. 15. The pulmonic valve was grossly normal. Pulmonic valve regurgitation is not visualized by color flow Doppler. 16. The mitral valve is grossly normal. Trace mitral valve regurgitation. 17. The inferior vena cava is dilated in size with <50% respiratory variability, suggesting right atrial pressure of 15 mmHg. 18. Trivial pericardial effusion is present. 19. No evidence of any gross valvular vegetations.  Patient Profile     63 y.o. male CAD s/p CABG x 2 (SVG-OM, SVG-PDA 08/27/19), AAA, bicuspid aortic valve with severe valvular regrugitation s/p bentall procedure, pulmonary hypertension, former smoker, asthma, HTN, HLD, hypothyroidism, and BPH. He [facility] to [facility] with CP relieved with nitro x 3.  Episode of cough and lung spasm >states these are very brief, but feels tight in his throat  Assessment & Plan    1.  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  CT surgery has evaluated and said okay to use steroids if needed.  2.  Coronary artery disease: Status post two-vessel CABG and Bentall procedure.  Symptomatically stable from the standpoint.  Unable to use antiplatelet therapy due to anemia.  Hemoglobin 7.8 today.  Continue Lipitor.  3.  Postoperative anemia: Hemoglobin 7.8 today.  [facility] will sign off.   Medication Recommendations:  As above Other recommendations (labs, testing, etc):  None Follow up as an outpatient:  Call to schedule  For questions or updates, please contact [place] Please consult [email or web address] for contact info under Cardiology/STEMI.      Signed, Kate Sable, MD  09/19/2019, 11:29 AM

## 2019-09-20 DIAGNOSIS — I25708 Atherosclerosis of coronary artery bypass graft(s), unspecified, with other forms of angina pectoris: Secondary | ICD-10-CM

## 2019-09-20 DIAGNOSIS — I4891 Unspecified atrial fibrillation: Secondary | ICD-10-CM

## 2019-09-20 DIAGNOSIS — D649 Anemia, unspecified: Secondary | ICD-10-CM

## 2019-09-20 DIAGNOSIS — I2510 Atherosclerotic heart disease of native coronary artery without angina pectoris: Secondary | ICD-10-CM

## 2019-09-20 LAB — CBC
HCT: 24.6 % — ABNORMAL LOW (ref 39.0–52.0)
Hemoglobin: 8 g/dL — ABNORMAL LOW (ref 13.0–17.0)
MCH: 29.2 pg (ref 26.0–34.0)
MCHC: 32.5 g/dL (ref 30.0–36.0)
MCV: 89.8 fL (ref 80.0–100.0)
Platelets: 562 10*3/uL — ABNORMAL HIGH (ref 150–400)
RBC: 2.74 MIL/uL — ABNORMAL LOW (ref 4.22–5.81)
RDW: 15.3 % (ref 11.5–15.5)
WBC: 7.6 10*3/uL (ref 4.0–10.5)
nRBC: 0 % (ref 0.0–0.2)

## 2019-09-20 LAB — BASIC METABOLIC PANEL
Anion gap: 13 (ref 5–15)
BUN: 19 mg/dL (ref 8–23)
CO2: 22 mmol/L (ref 22–32)
Calcium: 8.2 mg/dL — ABNORMAL LOW (ref 8.9–10.3)
Chloride: 99 mmol/L (ref 98–111)
Creatinine, Ser: 0.93 mg/dL (ref 0.61–1.24)
GFR calc Af Amer: >60 mL/min (ref >60)
GFR calc non Af Amer: >60 mL/min (ref >60)
Glucose, Bld: 102 mg/dL — ABNORMAL HIGH (ref 70–99)
Potassium: 3.6 mmol/L (ref 3.5–5.1)
Sodium: 134 mmol/L — ABNORMAL LOW (ref 135–145)

## 2019-09-20 LAB — MAGNESIUM: Magnesium: 2.3 mg/dL (ref 1.7–2.4)

## 2019-09-20 LAB — BRAIN NATRIURETIC PEPTIDE: B Natriuretic Peptide: 604.5 pg/mL — ABNORMAL HIGH (ref 0.0–100.0)

## 2019-09-20 LAB — PROCALCITONIN: Procalcitonin: 0.49 ng/mL

## 2019-09-20 MED ORDER — AMIODARONE LOAD VIA INFUSION
150.0000 mg | Freq: Once | INTRAVENOUS | Status: AC
  Administered 2019-09-20: 150 mg via INTRAVENOUS
  Filled 2019-09-20: qty 83.34

## 2019-09-20 MED ORDER — DILTIAZEM LOAD VIA INFUSION: 15.0000 mg | Freq: Once | INTRAVENOUS | Status: DC

## 2019-09-20 MED ORDER — AMIODARONE HCL IN DEXTROSE 360-4.14 MG/200ML-% IV SOLN
60.0000 mg/h | INTRAVENOUS | Status: AC
  Administered 2019-09-20: 60 mg/h via INTRAVENOUS
  Filled 2019-09-20: qty 200

## 2019-09-20 MED ORDER — ASPIRIN EC 81 MG PO TBEC
81.0000 mg | DELAYED_RELEASE_TABLET | Freq: Every day | ORAL | Status: DC
  Administered 2019-09-20 – 2019-09-24 (×5): 81 mg via ORAL
  Filled 2019-09-20 (×5): qty 1

## 2019-09-20 MED ORDER — AMIODARONE HCL IN DEXTROSE 360-4.14 MG/200ML-% IV SOLN
30.0000 mg/h | INTRAVENOUS | Status: DC
  Administered 2019-09-20 – 2019-09-21 (×4): 30 mg/h via INTRAVENOUS
  Filled 2019-09-20 (×5): qty 200

## 2019-09-20 NOTE — Progress Notes (Signed)
PROGRESS NOTE  Jeffrey Tucker PNT:[id number] DOB: 1956-11-24   PCP: Venia Carbon, MD  Patient is from: Home  DOA: 09/16/2019 LOS: 4  Brief Narrative / Interim history: 63 year old male with history of CAD/CABG x2, AAA repair and AVR (Bentall procedure) on 08/27/2019, pulmonary HTN, former smoker, asthma, HTN, HLD, hypothyroidism and BPH presenting with chest discomfort, generalized weakness, episodic "lung spasm"/orthopnea?, edema, fever to 102 and dry cough.   In ED, HR 110s.  RR in 68s.  BP normal.  Saturation upper 90s on room air.  WBC 13.  Hgb 8.1 (baseline).  Platelets 630.  Mild troponin elevation to 149> 168.  BNP 450.  CRP 16.4.  ESR 89.  TSH 10.  EKG without acute ischemic finding.  CXR with small left pleural effusion and associated LLL opacity.  CTA chest moderate cardiomegaly with evidence of right heart dysfunction, slight interval increase in the amount of pericardial effusion, 5 x 5 cm ill-defined collection anterior inferior to the heart and posterior to sternotomy suggestive for postsurgical seroma, improved aeration of lungs with decrease in consolidative changes of LLE compared to prior CT and no PE.  Cardiology and CTS consulted and patient was admitted for post cardiotomy syndrome.  Assessment & Plan: Chest discomfort/dyspnea/"lung spasm": unclear etiology of this.  Differential diagnosis include post cardiotomy syndrome/Dressler syndrome, pneumonia, pericarditis, CHF exacerbation, symptomatic anemia, anxiety and asthma.  Patient has had fever, chills, leukocytosis, elevated inflammatory markers and some LLL consolidative changes on CT although improved concerning for PNA.  PCT elevated.  With pericardial effusion, concern about pericarditis as well. He also have orthopnea, edema,  elevated BNP and troponin concerning for CHF.  Echo shows 45 to 50% ejection fraction.  No wall motion abnormality. -Appreciate cardiology and CTS guidance. -Started on colchicine 0.6 mg twice  daily for possible pericarditis.Faythe Ghee to use steroid if needed per CTS. -Continue ceftriaxone and azithromycin 10/22>> for pneumonia. -Budesonide, DuoNeb, PPI, as needed Ativan  Acute Systolic GQQ:PYPP with EF of 45 to 50%, moderate LVH, moderately reduced RV EF, fluid collection compressing on RV.  Received IV Lasix 40 mg once on 10/22.  Cr bumped up.  Soft blood pressures.  Lasix discontinued. Management per cardiology.  CAD/CABG x2, AAA repair and AVR on 08/27/2019: CTA negative for PE but some pericardial effusion.  No signs of tamponade.  Echocardiogram as above.  Metoprolol discontinued due to ?"lung spasm" -Continue statins. -Soft blood pressures to reinitiate BB. -Hold aspirin  Acute/subacute blood loss anemia: Hgb 8.1>> 7.6>1u>8.5>7.8.  Baseline 7-8.  No signs of active bleed.  Anemia panel consistent with iron deficiency.  FOBT negative x2 -Recheck CBC in the afternoon -Infuse Feraheme 510 mg once -Continue p.o. iron His aspirin has been held due to anemia however his hemoglobin has remained stable since last few days.  Will defer to cardiology for further decision regarding resuming aspirin.  New onset atrial fibrillation with RVR: Rates around 150.  He is completely asymptomatic.  Cardiology has ordered for amiodarone bolus and drip which was in the process of being initiated when I saw patient.  Will also need anticoagulation.  Defer management to cardiology.    AKI: Resolved.  Was likely due to IV Lasix. Monitor daily.  Mild hyponatremia: Improved. -Continue monitoring  Right lower extremity swelling s/p SVG harvest.  Bilateral Doppler negative for DVT.  No calf tenderness. -Encourage leg elevation -We will try TED hose.  Mild intermittent asthma: No wheezing.  Unclear explanation for his "lung spasm" but improving.  Phrenic nerve irritation? -Breathing treatments as above. -Continue Tessalon Perles  Essential hypertension: Blood pressure on the soft side.  Monitor  closely while he is going to be on amiodarone drip.  Hypothyroidism: TSH marginally elevated.  -Continue home Synthroid -Recheck TSH in 4 to 6 weeks outpatient.   Thrombocytosis: Reactive? -Continue monitoring.  Mildly elevated alkaline phosphatase -Continue trending.  DVT prophylaxis: SCD Code Status: Full code Family Communication: Patient's wife was updated by nurses today.  I will call her later today. Disposition Plan: Remains inpatient Consultants: Cardiology, CTS   Subjective: I was paged at 8:27 AM that patient has flipped into A. fib with RVR, new onset.  Spoke to nurse over the phone and requested paging cardiology since they are on board.  Placed IV Cardizem bolus dose in the meantime until cardiology can return the page.  Came to the bedside within few minutes.  RN and charge nurse at bedside.  Patient seen and examined.  He was comfortable.  Denied any complaint such as chest pain, shortness of breath, palpitation or dizziness.  Objective: Vitals:   09/20/19 0836 09/20/19 0847 09/20/19 0850 09/20/19 0856  BP: 102/79 98/74 (!) 85/74 97/79  Pulse: (!) 51 (!) 127 64 (!) 31  Resp: (!) 26 (!) 28 (!) 26 (!) 28  Temp:      TempSrc:      SpO2: 92% 94% 94% 95%  Weight:      Height:        Intake/Output Summary (Last 24 hours) at 09/20/2019 0937 Last data filed at 09/19/2019 1600 Gross per 24 hour  Intake 100 ml  Output -  Net 100 ml   Filed Weights   09/16/19 2223 09/17/19 0449 09/20/19 0500  Weight: 69.6 kg 69.5 kg 68.6 kg    Examination:  General exam: Appears calm and comfortable  Respiratory system: Diminished breath sounds at the bases. Respiratory effort normal. Cardiovascular system: S1 & S2 heard, irregularly irregular rate and rhythm. No JVD, murmurs, rubs, gallops or clicks. No pedal edema. Gastrointestinal system: Abdomen is nondistended, soft and nontender. No organomegaly or masses felt. Normal bowel sounds heard. Central nervous system: Alert and  oriented. No focal neurological deficits. Extremities: Symmetric 5 x 5 power. Skin: No rashes, lesions or ulcers.  Psychiatry: Judgement and insight appear normal. Mood & affect flat.  Procedures:  None  Microbiology summarized: GLOVF-64 negative. Blood cultures negative so far.  Sch Meds:  Scheduled Meds: . atorvastatin  80 mg Oral q1800  . azithromycin  250 mg Oral Daily  . benzonatate  100 mg Oral TID  . budesonide (PULMICORT) nebulizer solution  0.5 mg Nebulization BID  . colchicine  0.6 mg Oral BID  . ferrous sulfate  325 mg Oral Q breakfast  . ipratropium-albuterol  3 mL Nebulization BID  . levothyroxine  125 mcg Oral Daily  . pantoprazole  40 mg Oral BID   Continuous Infusions: . amiodarone 60 mg/hr (09/20/19 0854)   Followed by  . amiodarone    . cefTRIAXone (ROCEPHIN)  IV 2 g (09/19/19 1338)   PRN Meds:.acetaminophen **OR** acetaminophen, ipratropium-albuterol, LORazepam, morphine injection, ondansetron **OR** ondansetron (ZOFRAN) IV, polyethylene glycol, traMADol  Antimicrobials: Anti-infectives (From admission, onward)   Start     Dose/Rate Route Frequency Ordered Stop   09/18/19 1000  azithromycin (ZITHROMAX) tablet 250 mg     250 mg Oral Daily 09/17/19 1208 09/22/19 0959   09/17/19 1300  cefTRIAXone (ROCEPHIN) 2 g in sodium chloride 0.9 % 100 mL IVPB  2 g 200 mL/hr over 30 Minutes Intravenous Every 24 hours 09/17/19 1208     09/17/19 1215  azithromycin (ZITHROMAX) tablet 500 mg     500 mg Oral Daily 09/17/19 1208 09/17/19 1319       I have personally reviewed the following labs and images: CBC: Recent Labs  Lab 09/16/19 1336 09/17/19 0303 09/17/19 1945 09/18/19 2110 09/19/19 0312 09/19/19 1314 09/20/19 0345  WBC 13.0* 11.2* 14.0*  --  8.9 9.3 7.6  NEUTROABS 10.9*  --   --   --   --   --   --   HGB 8.1* 7.8* 7.6* 8.5* 7.8* 8.1* 8.0*  HCT 27.0* 25.1* 24.2* 26.9* 24.7* 25.4* 24.6*  MCV 94.4 90.3 90.6  --  90.1 90.4 89.8  PLT 630* 648* 689*   --  519* 529* 562*   BMP &GFR Recent Labs  Lab 09/16/19 1336 09/16/19 2057 09/17/19 0303 09/17/19 1945 09/18/19 0404 09/19/19 0312 09/20/19 0345  NA 136  --  133*  --  131* 134* 134*  K 4.0  --  4.9  --  4.6 3.6 3.6  CL 101  --  100  --  96* 100 99  CO2 22  --  21*  --  20* 23 22  GLUCOSE 162*  --  160*  --  180* 118* 102*  BUN 14  --  17  --  26* 24* 19  CREATININE 0.86  --  0.92 1.15 1.28* 0.91 0.93  CALCIUM 8.7*  --  8.6*  --  8.5* 8.4* 8.2*  MG  --  2.1  --   --  2.1 2.2 2.3  PHOS  --  5.2*  --   --   --   --   --    Estimated Creatinine Clearance: 73.4 mL/min (by C-G formula based on SCr of 0.93 mg/dL). Liver & Pancreas: Recent Labs  Lab 09/16/19 1336 09/17/19 0303  AST 26 28  ALT 16 21  ALKPHOS 151* 157*  BILITOT 0.6 0.5  PROT 7.3 7.3  ALBUMIN 2.8* 2.7*   No results for input(s): LIPASE, AMYLASE in the last 168 hours. No results for input(s): AMMONIA in the last 168 hours. Diabetic: No results for input(s): HGBA1C in the last 72 hours. No results for input(s): GLUCAP in the last 168 hours. Cardiac Enzymes: No results for input(s): CKTOTAL, CKMB, CKMBINDEX, TROPONINI in the last 168 hours. No results for input(s): PROBNP in the last 8760 hours. Coagulation Profile: No results for input(s): INR, PROTIME in the last 168 hours. Thyroid Function Tests: No results for input(s): TSH, T4TOTAL, FREET4, T3FREE, THYROIDAB in the last 72 hours. Lipid Profile: No results for input(s): CHOL, HDL, LDLCALC, TRIG, CHOLHDL, LDLDIRECT in the last 72 hours. Anemia Panel: Recent Labs    09/17/19 1945  VITAMINB12 317  FOLATE 8.8  FERRITIN 501*  TIBC 252  IRON 11*  RETICCTPCT 2.6   Urine analysis:    Component Value Date/Time   COLORURINE YELLOW 08/25/2019 0902   APPEARANCEUR CLEAR 08/25/2019 0902   APPEARANCEUR Cloudy (A) 06/19/2017 0828   LABSPEC 1.021 08/25/2019 0902   PHURINE 5.0 08/25/2019 0902   GLUCOSEU NEGATIVE 08/25/2019 0902   HGBUR NEGATIVE 08/25/2019  0902   BILIRUBINUR NEGATIVE 08/25/2019 0902   BILIRUBINUR Negative 06/19/2017 0828   KETONESUR NEGATIVE 08/25/2019 0902   PROTEINUR NEGATIVE 08/25/2019 0902   NITRITE NEGATIVE 08/25/2019 0902   LEUKOCYTESUR NEGATIVE 08/25/2019 0902   Sepsis Labs: Invalid input(s): PROCALCITONIN, Allen  Microbiology: Recent Results (  from the past 240 hour(s))  Novel Coronavirus, NAA ([facility])     Status: None   Collection Time: 09/15/19 10:50 AM   Specimen: Oropharyngeal(OP) collection in vial transport medium   OROPHARYNGEA  TESTING  Result Value Ref Range Status   SARS-CoV-2, NAA Not Detected Not Detected Final    Comment: This nucleic acid amplification test was developed and its performance characteristics determined by [employer]. Nucleic acid amplification tests include PCR and TMA. This test has not been FDA cleared or approved. This test has been authorized by FDA under an Emergency Use Authorization (EUA). This test is only authorized for the duration of time the declaration that circumstances exist justifying the authorization of the emergency use of in vitro diagnostic tests for detection of SARS-CoV-2 virus and/or diagnosis of COVID-19 infection under section 564(b)(1) of the Act, 21 U.S.C. 062BJS-2(G) (1), unless the authorization is terminated or revoked sooner. When diagnostic testing is negative, the possibility of a false negative result should be considered in the context of a patient's recent exposures and the presence of clinical signs and symptoms consistent with COVID-19. An individual without symptoms of COVID-19 and who is not shedding SARS-CoV-2 virus would  expect to have a negative (not detected) result in this assay.   SARS CORONAVIRUS 2 (TAT 6-24 HRS) Nasopharyngeal Nasopharyngeal Swab     Status: None   Collection Time: 09/16/19  6:00 PM   Specimen: Nasopharyngeal Swab  Result Value Ref Range Status   SARS Coronavirus 2 NEGATIVE NEGATIVE Final     Comment: (NOTE) SARS-CoV-2 target nucleic acids are NOT DETECTED. The SARS-CoV-2 RNA is generally detectable in upper and lower respiratory specimens during the acute phase of infection. Negative results do not preclude SARS-CoV-2 infection, do not rule out co-infections with other pathogens, and should not be used as the sole basis for treatment or other patient management decisions. Negative results must be combined with clinical observations, patient history, and epidemiological information. The expected result is Negative. Fact Sheet for Patients: SugarRoll.be Fact Sheet for Healthcare Providers: https://www.woods-mathews.com/ This test is not yet approved or cleared by the [place] FDA and  has been authorized for detection and/or diagnosis of SARS-CoV-2 by FDA under an Emergency Use Authorization (EUA). This EUA will remain  in effect (meaning this test can be used) for the duration of the COVID-19 declaration under Section 56 4(b)(1) of the Act, 21 U.S.C. section 360bbb-3(b)(1), unless the authorization is terminated or revoked sooner. Performed at [facility], [place] [address] [place], [place] [address]   Culture, blood (routine x 2)     Status: None (Preliminary result)   Collection Time: 09/16/19  8:57 PM   Specimen: BLOOD  Result Value Ref Range Status   Specimen Description BLOOD RIGHT ANTECUBITAL  Final   Special Requests   Final    BOTTLES DRAWN AEROBIC AND ANAEROBIC Blood Culture adequate volume   Culture   Final    NO GROWTH 3 DAYS Performed at [facility], [place] [address], [place], [place] [address]    Report Status PENDING  Incomplete  Culture, blood (routine x 2)     Status: None (Preliminary result)   Collection Time: 09/16/19  9:08 PM   Specimen: BLOOD RIGHT HAND  Result Value Ref Range Status   Specimen Description BLOOD RIGHT HAND  Final   Special Requests   Final    BOTTLES DRAWN AEROBIC AND  ANAEROBIC Blood Culture results may not be optimal due to an inadequate  volume of blood received in culture bottles   Culture   Final    NO GROWTH 3 DAYS Performed at [facility], [place] [address] [place], [place] [address]    Report Status PENDING  Incomplete    Radiology Studies: No results found.  Darliss Cheney, MD [facility]  If 7PM-7AM, please contact night-coverage [email or web address] Password [facility] 09/20/2019, 9:37 AM

## 2019-09-20 NOTE — Progress Notes (Signed)
Progress Note  Patient Name: Jeffrey Tucker Date of Encounter: 09/20/2019  Primary Cardiologist: Ida Rogue, MD   Subjective   Developed rapid atrial fibrillation and started on IV amiodarone. Asymptomatic from this standpoint. Converted to sinus rhythm. He is resting comfortably. His appetite has improved. I spoke at length to his wife who was also present. They have a strong faith. She said he's been a little depressed but did FaceTime with their 2 children and this helped lift up his spirits.  Inpatient Medications    Scheduled Meds: . aspirin EC  81 mg Oral Daily  . atorvastatin  80 mg Oral q1800  . azithromycin  250 mg Oral Daily  . benzonatate  100 mg Oral TID  . budesonide (PULMICORT) nebulizer solution  0.5 mg Nebulization BID  . colchicine  0.6 mg Oral BID  . ferrous sulfate  325 mg Oral Q breakfast  . ipratropium-albuterol  3 mL Nebulization BID  . levothyroxine  125 mcg Oral Daily  . pantoprazole  40 mg Oral BID   Continuous Infusions: . amiodarone 60 mg/hr (09/20/19 0854)   Followed by  . amiodarone    . cefTRIAXone (ROCEPHIN)  IV 2 g (09/19/19 1338)   PRN Meds: acetaminophen **OR** acetaminophen, ipratropium-albuterol, LORazepam, morphine injection, ondansetron **OR** ondansetron (ZOFRAN) IV, polyethylene glycol, traMADol   Vital Signs    Vitals:   09/20/19 0955 09/20/19 1000 09/20/19 1030 09/20/19 1205  BP: (!) 89/74 94/77 106/76 112/86  Pulse: (!) 41 68 92 85  Resp: (!) 25 (!) 25 (!) 23 (!) 26  Temp:    98.1 F (36.7 C)  TempSrc:    Oral  SpO2: 95% 95% 95% 96%  Weight:      Height:        Intake/Output Summary (Last 24 hours) at 09/20/2019 1220 Last data filed at 09/19/2019 1600 Gross per 24 hour  Intake 100 ml  Output -  Net 100 ml   Filed Weights   09/16/19 2223 09/17/19 0449 09/20/19 0500  Weight: 69.6 kg 69.5 kg 68.6 kg    Telemetry    Previously in rapid atrial fibrillation, now in sinus rhythm - Personally Reviewed  ECG    Rapid atrial fibrillation, 134 bpm, lateral T wave inversions, nonspecific ST segment depressions - Personally Reviewed  Physical Exam   GEN: No acute distress.   Neck: No JVD Cardiac: RRR, no murmurs, rubs, or gallops.  Respiratory: Clear to auscultation bilaterally. GI: Soft, nontender, non-distended  MS: No edema; No deformity. Neuro:  Nonfocal  Psych: Normal affect   Labs    Chemistry Recent Labs  Lab 09/16/19 1336 09/17/19 0303  09/18/19 0404 09/19/19 0312 09/20/19 0345  NA 136 133*  --  131* 134* 134*  K 4.0 4.9  --  4.6 3.6 3.6  CL 101 100  --  96* 100 99  CO2 22 21*  --  20* 23 22  GLUCOSE 162* 160*  --  180* 118* 102*  BUN 14 17  --  26* 24* 19  CREATININE 0.86 0.92   < > 1.28* 0.91 0.93  CALCIUM 8.7* 8.6*  --  8.5* 8.4* 8.2*  PROT 7.3 7.3  --   --   --   --   ALBUMIN 2.8* 2.7*  --   --   --   --   AST 26 28  --   --   --   --   ALT 16 21  --   --   --   --  ALKPHOS 151* 157*  --   --   --   --   BILITOT 0.6 0.5  --   --   --   --   GFRNONAA >60 >60   < > 59* >60 >60  GFRAA >60 >60   < > >60 >60 >60  ANIONGAP 13 12  --  15 11 13    < > = values in this interval not displayed.     Hematology Recent Labs  Lab 09/19/19 0312 09/19/19 1314 09/20/19 0345  WBC 8.9 9.3 7.6  RBC 2.74* 2.81* 2.74*  HGB 7.8* 8.1* 8.0*  HCT 24.7* 25.4* 24.6*  MCV 90.1 90.4 89.8  MCH 28.5 28.8 29.2  MCHC 31.6 31.9 32.5  RDW 15.1 15.1 15.3  PLT 519* 529* 562*    Cardiac EnzymesNo results for input(s): TROPONINI in the last 168 hours. No results for input(s): TROPIPOC in the last 168 hours.   BNP Recent Labs  Lab 09/16/19 2057 09/20/19 0345  BNP 451.3* 604.5*     DDimer No results for input(s): DDIMER in the last 168 hours.   Radiology    No results found.  Cardiac Studies   Echo 09/18/2019 1. Left ventricular ejection fraction, by visual estimation, is 45 to 50%. The left ventricle has mildly decreased function. There is moderately increased left  ventricular hypertrophy. 2. Indeterminate diastolic filling due to E-A fusion pattern of LV diastolic filling. 3. Inferior and inferoseptal hypokinesis 4. Global right ventricle has moderately reduced systolic function.The right ventricular size is small. No increase in right ventricular wall thickness. 5. Hypokinetic right ventricular apex. 6. RV small -apperas to be extrinsically compressed by an echogeneic fluid collection which is anterior and lateral to the RV. 7. Left atrial size was normal. 8. Right atrial size was normal. 9. The tricuspid valve is abnormal. Tricuspid valve regurgitation is mild. 10. Prosthetic aortic graft in the ascending aorta position. 11. Aortic valve regurgitation was not visualized by color flow Doppler. 12. Aortic valve peak gradient measures 13.1 mmHg. 13. Aortic valve mean gradient measures 7.0 mmHg. 14. 27 mm Konect Resilia Aortic Valve Conduit. 15. The pulmonic valve was grossly normal. Pulmonic valve regurgitation is not visualized by color flow Doppler. 16. The mitral valve is grossly normal. Trace mitral valve regurgitation. 17. The inferior vena cava is dilated in size with <50% respiratory variability, suggesting right atrial pressure of 15 mmHg. 18. Trivial pericardial effusion is present. 19. No evidence of any gross valvular vegetations.  Patient Profile     63 y.o. male 63 y.o. male CAD s/p CABG x 2 (SVG-OM, SVG-PDA 08/27/19), AAA, bicuspid aortic valve with severe valvular regrugitation s/p bentall procedure, pulmonary hypertension, former smoker, asthma, HTN, HLD, hypothyroidism, and BPH. He [facility] to [facility] with CP relieved with nitro x 3.  Assessment & Plan    1.  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  CT surgery has evaluated and said okay to use steroids if needed.  2.  Coronary artery disease: Status post two-vessel CABG and Bentall procedure.  Symptomatically stable from the standpoint.  Hemoglobin 8 today. ASA started by internal medicine today. Will need further monitoring. Continue Lipitor.  3.  Postoperative anemia: Hemoglobin 8 today. ASA started by internal medicine today. Will need further monitoring. Will defer transfusion, if deemed necessary, to CT surgery.  4. New onset rapid atrial fibrillation: Asymptomatic and has since converted to sinus rhythm. Started on IV amiodarone which I would continue until tomorrow. Not a candidate  for systemic anticoagulation given postoperative anemia, Hgb 8 today. Started on ASA 81 mg by internal medicine. Hopefully he will not need have recurrence and need amio for a long period of time as this may simply be due to Dressler syndrome.    For questions or updates, please contact Te[place]ita Please consult [email or web address] for contact info under Cardiology/STEMI.      Signed, Kate Sable, MD  09/20/2019, 12:20 PM

## 2019-09-20 NOTE — Progress Notes (Signed)
Change in condition at 8:04 am  Afib RVR rate 120-160's  Attending notified Cardiology notified  RR notified New orders received Spouse notified of changes

## 2019-09-20 NOTE — Significant Event (Signed)
Rapid Response Event Note  Patient went into AF RVR per nurse, HR 120-160s. Patient was not symptomatic, nurse was in the process of paging the CARDS MD on call. I instructed her to do that first and that I would come see the patient after if she needed to me. I was called to another medical emergency, so I did not see the patient. I called back for an update at 0849 and per nurse, patient was started on AMIO. I called again for an update at 1023 and per the nurse, patient had converted into SR. I did not see patient.   Initial Call Time 0831   Jeffrey Tucker R

## 2019-09-21 DIAGNOSIS — I241 Dressler's syndrome: Principal | ICD-10-CM

## 2019-09-21 LAB — CBC WITH DIFFERENTIAL/PLATELET
Abs Immature Granulocytes: 0.09 10*3/uL — ABNORMAL HIGH (ref 0.00–0.07)
Basophils Absolute: 0 10*3/uL (ref 0.0–0.1)
Basophils Relative: 0 %
Eosinophils Absolute: 0.2 10*3/uL (ref 0.0–0.5)
Eosinophils Relative: 2 %
HCT: 26 % — ABNORMAL LOW (ref 39.0–52.0)
Hemoglobin: 8 g/dL — ABNORMAL LOW (ref 13.0–17.0)
Immature Granulocytes: 1 %
Lymphocytes Relative: 19 %
Lymphs Abs: 1.7 10*3/uL (ref 0.7–4.0)
MCH: 28.7 pg (ref 26.0–34.0)
MCHC: 30.8 g/dL (ref 30.0–36.0)
MCV: 93.2 fL (ref 80.0–100.0)
Monocytes Absolute: 1 10*3/uL (ref 0.1–1.0)
Monocytes Relative: 11 %
Neutro Abs: 5.9 10*3/uL (ref 1.7–7.7)
Neutrophils Relative %: 67 %
Platelets: 503 10*3/uL — ABNORMAL HIGH (ref 150–400)
RBC: 2.79 MIL/uL — ABNORMAL LOW (ref 4.22–5.81)
RDW: 15.9 % — ABNORMAL HIGH (ref 11.5–15.5)
WBC: 8.9 10*3/uL (ref 4.0–10.5)
nRBC: 0.4 % — ABNORMAL HIGH (ref 0.0–0.2)

## 2019-09-21 LAB — CULTURE, BLOOD (ROUTINE X 2)
Culture: NO GROWTH
Culture: NO GROWTH
Special Requests: ADEQUATE

## 2019-09-21 LAB — BASIC METABOLIC PANEL
Anion gap: 13 (ref 5–15)
BUN: 16 mg/dL (ref 8–23)
CO2: 22 mmol/L (ref 22–32)
Calcium: 8.2 mg/dL — ABNORMAL LOW (ref 8.9–10.3)
Chloride: 100 mmol/L (ref 98–111)
Creatinine, Ser: 0.93 mg/dL (ref 0.61–1.24)
GFR calc Af Amer: >60 mL/min (ref >60)
GFR calc non Af Amer: >60 mL/min (ref >60)
Glucose, Bld: 72 mg/dL (ref 70–99)
Potassium: 3.1 mmol/L — ABNORMAL LOW (ref 3.5–5.1)
Sodium: 135 mmol/L (ref 135–145)

## 2019-09-21 LAB — MAGNESIUM: Magnesium: 2.1 mg/dL (ref 1.7–2.4)

## 2019-09-21 MED ORDER — POTASSIUM CHLORIDE CRYS ER 20 MEQ PO TBCR
40.0000 meq | EXTENDED_RELEASE_TABLET | ORAL | Status: AC
  Administered 2019-09-21 (×2): 40 meq via ORAL
  Filled 2019-09-21 (×2): qty 2

## 2019-09-21 MED ORDER — ZOLPIDEM TARTRATE 5 MG PO TABS
10.0000 mg | ORAL_TABLET | Freq: Every evening | ORAL | Status: DC | PRN
  Administered 2019-09-21: 10 mg via ORAL
  Filled 2019-09-21: qty 2

## 2019-09-21 NOTE — Progress Notes (Signed)
Patient with episode of nausea while working with PT, zofran given as ordered as needed for nausea will monitor patient. Monterrio Gerst, Bettina Gavia RN

## 2019-09-21 NOTE — Progress Notes (Signed)
PROGRESS NOTE  Jeffrey Tucker LKT:[id number] DOB: Jan 19, 1956   PCP: Venia Carbon, MD  Patient is from: Home  DOA: 09/16/2019 LOS: 5  Brief Narrative / Interim history: 63 year old male with history of CAD/CABG x2, AAA repair and AVR (Bentall procedure) on 08/27/2019, pulmonary HTN, former smoker, asthma, HTN, HLD, hypothyroidism and BPH presenting with chest discomfort, generalized weakness, episodic "lung spasm"/orthopnea?, edema, fever to 102 and dry cough.   In ED, HR 110s.  RR in 61s.  BP normal.  Saturation upper 90s on room air.  WBC 13.  Hgb 8.1 (baseline).  Platelets 630.  Mild troponin elevation to 149> 168.  BNP 450.  CRP 16.4.  ESR 89.  TSH 10.  EKG without acute ischemic finding.  CXR with small left pleural effusion and associated LLL opacity.  CTA chest moderate cardiomegaly with evidence of right heart dysfunction, slight interval increase in the amount of pericardial effusion, 5 x 5 cm ill-defined collection anterior inferior to the heart and posterior to sternotomy suggestive for postsurgical seroma, improved aeration of lungs with decrease in consolidative changes of LLE compared to prior CT and no PE.  Cardiology and CTS consulted and patient was admitted for post cardiotomy syndrome.  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  He then went into atrial fibrillation with RVR, new onset on the morning of 09/20/2019.  Cardiology started him on amiodarone drip.  Assessment & Plan: Chest discomfort/dyspnea/"lung spasm": unclear etiology of this.  Differential diagnosis include post cardiotomy syndrome/Dressler syndrome, pneumonia, pericarditis, CHF exacerbation, symptomatic anemia, anxiety and asthma.  Patient has had fever, chills, leukocytosis, elevated inflammatory markers and some LLL consolidative changes on CT although improved concerning for PNA.  PCT elevated.  With pericardial effusion, concern about pericarditis as well. He also have  orthopnea, edema,  elevated BNP and troponin concerning for CHF.  Echo shows 45 to 50% ejection fraction.  No wall motion abnormality. -Appreciate cardiology and CTS guidance. -Cardiology started on colchicine 0.6 mg twice daily for possible pericarditis.Faythe Ghee to use steroid if needed per CTS. -Continue ceftriaxone and azithromycin 10/22>> for pneumonia. -Budesonide, DuoNeb, PPI, as needed Ativan  Acute Systolic DSK:AJGO with EF of 45 to 50%, moderate LVH, moderately reduced RV EF, fluid collection compressing on RV.  Received IV Lasix 40 mg once on 10/22.  Cr bumped up.  Soft blood pressures.  Lasix discontinued.  He is on room air. Management per cardiology.  CAD/CABG x2, AAA repair and AVR on 08/27/2019: CTA negative for PE but some pericardial effusion.  No signs of tamponade.  Echocardiogram as above.  Metoprolol discontinued due to ?"lung spasm" -Continue statins. -Soft blood pressures to reinitiate BB. -Hold aspirin  Acute/subacute blood loss anemia: Hgb 8.1>> 7.6>1u>8.5>7.8.  Baseline 7-8.  No signs of active bleed.  Anemia panel consistent with iron deficiency.  FOBT negative x2 -Recheck CBC in the afternoon -Infuse Feraheme 510 mg once -Continue p.o. iron Aspirin resumed on 09/20/2019.  New onset atrial fibrillation with RVR: Converted back to sinus rhythm yesterday when started on amiodarone drip.  He continues to be on amiodarone drip.  Per cardiology note, plan is to continue on IV and then switch to oral amiodarone tomorrow.  Per cardiology, he is not a candidate for anticoagulation due to anemia.  Appreciate cardiology help.  AKI: Resolved.  Was likely due to IV Lasix. Monitor daily.  Mild hyponatremia: Improved. -Continue monitoring  Right lower extremity swelling s/p SVG harvest.  Bilateral Doppler negative for DVT.  No calf tenderness. -Encourage leg elevation -We will try TED hose.  Mild intermittent asthma: No wheezing.  Unclear explanation for his "lung spasm"  but improving.  Phrenic nerve irritation? -Breathing treatments as above. -Continue Tessalon Perles  Essential hypertension: Blood pressure better than yesterday.  Monitor closely while he is going to be on amiodarone drip.  Hypothyroidism: TSH marginally elevated.  -Continue home Synthroid -Recheck TSH in 4 to 6 weeks outpatient.   Thrombocytosis: Reactive? -Continue monitoring.  Mildly elevated alkaline phosphatase -Continue trending.  DVT prophylaxis: SCD Code Status: Full code Family Communication: Patient's wife was updated by cardiology yesterday.  No family present at bedside today. Disposition Plan: Remains inpatient Consultants: Cardiology, CTS   Subjective: Patient seen and examined.  He states that he overall feels better.  Still has some intermittent shortness of breath.  There and did not have a good sleep.  Seems to be anxious.  Objective: Vitals:   09/21/19 0500 09/21/19 0554 09/21/19 0721 09/21/19 0830  BP:  116/84 116/84 114/90  Pulse:  82 81 81  Resp:  (!) _0 Temp:  98 F (36.7 C)  97.8 F (36.6 C)  TempSrc:  Oral  Oral  SpO2:  92% 98% 93%  Weight: 69.3 kg     Height:        Intake/Output Summary (Last 24 hours) at 09/21/2019 1012 Last data filed at 09/20/2019 1537 Gross per 24 hour  Intake 285.56 ml  Output -  Net 285.56 ml   Filed Weights   09/17/19 0449 09/20/19 0500 09/21/19 0500  Weight: 69.5 kg 68.6 kg 69.3 kg    Examination:  General exam: Appears calm and comfortable  Respiratory system: Clear to auscultation. Respiratory effort normal.  Sternotomy incision. Cardiovascular system: S1 & S2 heard, RRR. No JVD, murmurs, rubs, gallops or clicks. No pedal edema. Gastrointestinal system: Abdomen is nondistended, soft and nontender. No organomegaly or masses felt. Normal bowel sounds heard. Central nervous system: Alert and oriented. No focal neurological deficits. Extremities: Symmetric 5 x 5 power. Skin: No rashes, lesions or  ulcers.  Psychiatry: Judgement and insight appear normal. Mood & affect appropriate.   Procedures:  None  Microbiology summarized: [id number] negative. Blood cultures negative so far.  Sch Meds:  Scheduled Meds: . aspirin EC  81 mg Oral Daily  . atorvastatin  80 mg Oral q1800  . benzonatate  100 mg Oral TID  . budesonide (PULMICORT) nebulizer solution  0.5 mg Nebulization BID  . colchicine  0.6 mg Oral BID  . ferrous sulfate  325 mg Oral Q breakfast  . ipratropium-albuterol  3 mL Nebulization BID  . levothyroxine  125 mcg Oral Daily  . pantoprazole  40 mg Oral BID  . potassium chloride  40 mEq Oral Q4H   Continuous Infusions: . amiodarone 30 mg/hr (09/21/19 0823)  . cefTRIAXone (ROCEPHIN)  IV 2 g (09/20/19 1537)   PRN Meds:.acetaminophen **OR** acetaminophen, ipratropium-albuterol, LORazepam, morphine injection, ondansetron **OR** ondansetron (ZOFRAN) IV, polyethylene glycol, traMADol  Antimicrobials: Anti-infectives (From admission, onward)   Start     Dose/Rate Route Frequency Ordered Stop   09/18/19 1000  azithromycin (ZITHROMAX) tablet 250 mg     250 mg Oral Daily 09/17/19 1208 09/21/19 0854   09/17/19 1300  cefTRIAXone (ROCEPHIN) 2 g in sodium chloride 0.9 % 100 mL IVPB     2 g 200 mL/hr over 30 Minutes Intravenous Every 24 hours 09/17/19 1208     09/17/19 1215  azithromycin (ZITHROMAX) tablet 500 mg  500 mg Oral Daily 09/17/19 1208 09/17/19 1319       I have personally reviewed the following labs and images: CBC: Recent Labs  Lab 09/16/19 1336  09/17/19 1945 09/18/19 2110 09/19/19 0312 09/19/19 1314 09/20/19 0345 09/21/19 0316  WBC 13.0*   < > 14.0*  --  8.9 9.3 7.6 8.9  NEUTROABS 10.9*  --   --   --   --   --   --  5.9  HGB 8.1*   < > 7.6* 8.5* 7.8* 8.1* 8.0* 8.0*  HCT 27.0*   < > 24.2* 26.9* 24.7* 25.4* 24.6* 26.0*  MCV 94.4   < > 90.6  --  90.1 90.4 89.8 93.2  PLT 630*   < > 689*  --  519* 529* 562* 503*   < > = values in this interval not  displayed.   BMP &GFR Recent Labs  Lab 09/16/19 2057 09/17/19 0303 09/17/19 1945 09/18/19 0404 09/19/19 0312 09/20/19 0345 09/21/19 0316  NA  --  133*  --  131* 134* 134* 135  K  --  4.9  --  4.6 3.6 3.6 3.1*  CL  --  100  --  96* 100 99 100  CO2  --  21*  --  20* _0 GLUCOSE  --  160*  --  180* 118* 102* 72  BUN  --  17  --  26* 24* 19 16  CREATININE  --  0.92 1.15 1.28* 0.91 0.93 0.93  CALCIUM  --  8.6*  --  8.5* 8.4* 8.2* 8.2*  MG 2.1  --   --  2.1 2.2 2.3 2.1  PHOS 5.2*  --   --   --   --   --   --    Estimated Creatinine Clearance: 73.4 mL/min (by C-G formula based on SCr of 0.93 mg/dL). Liver & Pancreas: Recent Labs  Lab 09/16/19 1336 09/17/19 0303  AST 26 28  ALT 16 21  ALKPHOS 151* 157*  BILITOT 0.6 0.5  PROT 7.3 7.3  ALBUMIN 2.8* 2.7*   No results for input(s): LIPASE, AMYLASE in the last 168 hours. No results for input(s): AMMONIA in the last 168 hours. Diabetic: No results for input(s): HGBA1C in the last 72 hours. No results for input(s): GLUCAP in the last 168 hours. Cardiac Enzymes: No results for input(s): CKTOTAL, CKMB, CKMBINDEX, TROPONINI in the last 168 hours. No results for input(s): PROBNP in the last 8760 hours. Coagulation Profile: No results for input(s): INR, PROTIME in the last 168 hours. Thyroid Function Tests: No results for input(s): TSH, T4TOTAL, FREET4, T3FREE, THYROIDAB in the last 72 hours. Lipid Profile: No results for input(s): CHOL, HDL, LDLCALC, TRIG, CHOLHDL, LDLDIRECT in the last 72 hours. Anemia Panel: No results for input(s): VITAMINB12, FOLATE, FERRITIN, TIBC, IRON, RETICCTPCT in the last 72 hours. Urine analysis:    Component Value Date/Time   COLORURINE YELLOW 08/25/2019 0902   APPEARANCEUR CLEAR 08/25/2019 0902   APPEARANCEUR Cloudy (A) 06/19/2017 0828   LABSPEC 1.021 08/25/2019 0902   PHURINE 5.0 08/25/2019 0902   GLUCOSEU NEGATIVE 08/25/2019 0902   HGBUR NEGATIVE 08/25/2019 0902   BILIRUBINUR NEGATIVE  08/25/2019 0902   BILIRUBINUR Negative 06/19/2017 0828   KETONESUR NEGATIVE 08/25/2019 0902   PROTEINUR NEGATIVE 08/25/2019 0902   NITRITE NEGATIVE 08/25/2019 0902   LEUKOCYTESUR NEGATIVE 08/25/2019 0902   Sepsis Labs: Invalid input(s): PROCALCITONIN, LACTICIDVEN  Microbiology: Recent Results (from the past 240 hour(s))  Novel Coronavirus, NAA ([facility])  Status: None   Collection Time: 09/15/19 10:50 AM   Specimen: Oropharyngeal(OP) collection in vial transport medium   OROPHARYNGEA  TESTING  Result Value Ref Range Status   SARS-CoV-2, NAA Not Detected Not Detected Final    Comment: This nucleic acid amplification test was developed and its performance characteristics determined by [employer]. Nucleic acid amplification tests include PCR and TMA. This test has not been FDA cleared or approved. This test has been authorized by FDA under an Emergency Use Authorization (EUA). This test is only authorized for the duration of time the declaration that circumstances exist justifying the authorization of the emergency use of in vitro diagnostic tests for detection of SARS-CoV-2 virus and/or diagnosis of COVID-19 infection under section 564(b)(1) of the Act, 21 U.S.C. 063KZS-0(F) (1), unless the authorization is terminated or revoked sooner. When diagnostic testing is negative, the possibility of a false negative result should be considered in the context of a patient's recent exposures and the presence of clinical signs and symptoms consistent with COVID-19. An individual without symptoms of COVID-19 and who is not shedding SARS-CoV-2 virus would  expect to have a negative (not detected) result in this assay.   SARS CORONAVIRUS 2 (TAT 6-24 HRS) Nasopharyngeal Nasopharyngeal Swab     Status: None   Collection Time: 09/16/19  6:00 PM   Specimen: Nasopharyngeal Swab  Result Value Ref Range Status   SARS Coronavirus 2 NEGATIVE NEGATIVE Final    Comment: (NOTE) SARS-CoV-2  target nucleic acids are NOT DETECTED. The SARS-CoV-2 RNA is generally detectable in upper and lower respiratory specimens during the acute phase of infection. Negative results do not preclude SARS-CoV-2 infection, do not rule out co-infections with other pathogens, and should not be used as the sole basis for treatment or other patient management decisions. Negative results must be combined with clinical observations, patient history, and epidemiological information. The expected result is Negative. Fact Sheet for Patients: SugarRoll.be Fact Sheet for Healthcare Providers: https://www.woods-mathews.com/ This test is not yet approved or cleared by the [place] FDA and  has been authorized for detection and/or diagnosis of SARS-CoV-2 by FDA under an Emergency Use Authorization (EUA). This EUA will remain  in effect (meaning this test can be used) for the duration of the COVID-19 declaration under Section 56 4(b)(1) of the Act, 21 U.S.C. section 360bbb-3(b)(1), unless the authorization is terminated or revoked sooner. Performed at [facility], [place] [address], [place], [place] [address]   Culture, blood (routine x 2)     Status: None   Collection Time: 09/16/19  8:57 PM   Specimen: BLOOD  Result Value Ref Range Status   Specimen Description BLOOD RIGHT ANTECUBITAL  Final   Special Requests   Final    BOTTLES DRAWN AEROBIC AND ANAEROBIC Blood Culture adequate volume   Culture   Final    NO GROWTH 5 DAYS Performed at [facility], [place] [address], [place], [place] [address]    Report Status 09/21/2019 FINAL  Final  Culture, blood (routine x 2)     Status: None   Collection Time: 09/16/19  9:08 PM   Specimen: BLOOD RIGHT HAND  Result Value Ref Range Status   Specimen Description BLOOD RIGHT HAND  Final   Special Requests   Final    BOTTLES DRAWN AEROBIC AND ANAEROBIC Blood Culture results may not be optimal due to an  inadequate volume of blood received in culture bottles   Culture   Final    NO  GROWTH 5 DAYS Performed at [facility], [place] [address], [place], [place] [address]    Report Status 09/21/2019 FINAL  Final    Radiology Studies: No results found.  Total time spent 30 minutes  Darliss Cheney, MD [facility]  If 7PM-7AM, please contact night-coverage [email or web address] Password [email or web address] 09/21/2019, 10:12 AM

## 2019-09-21 NOTE — Progress Notes (Signed)
LAC IV noted to be infiltrated (red, warm, tender) and leaking, removed and amio infusion was moved to RFA IV. Pt states IV site is tender to touch. Instructed pt to notify if IV becomes painful. Will continue to monitor.

## 2019-09-21 NOTE — Progress Notes (Addendum)
Progress Note  Patient Name: Jeffrey Tucker Date of Encounter: 09/21/2019  Primary Cardiologist: Ida Rogue, MD   Subjective   No specific complaints this morning, but appears to not feel well. Weak.   Inpatient Medications    Scheduled Meds: . aspirin EC  81 mg Oral Daily  . atorvastatin  80 mg Oral q1800  . benzonatate  100 mg Oral TID  . budesonide (PULMICORT) nebulizer solution  0.5 mg Nebulization BID  . colchicine  0.6 mg Oral BID  . ferrous sulfate  325 mg Oral Q breakfast  . ipratropium-albuterol  3 mL Nebulization BID  . levothyroxine  125 mcg Oral Daily  . pantoprazole  40 mg Oral BID   Continuous Infusions: . amiodarone 30 mg/hr (09/21/19 0823)  . cefTRIAXone (ROCEPHIN)  IV 2 g (09/20/19 1537)   PRN Meds: acetaminophen **OR** acetaminophen, ipratropium-albuterol, LORazepam, morphine injection, ondansetron **OR** ondansetron (ZOFRAN) IV, polyethylene glycol, traMADol   Vital Signs    Vitals:   09/21/19 0500 09/21/19 0554 09/21/19 0721 09/21/19 0830  BP:  116/84 116/84 114/90  Pulse:  82 81 81  Resp:  (!) 22 18 18   Temp:  98 F (36.7 C)  97.8 F (36.6 C)  TempSrc:  Oral  Oral  SpO2:  92% 98% 93%  Weight: 69.3 kg     Height:        Intake/Output Summary (Last 24 hours) at 09/21/2019 0859 Last data filed at 09/20/2019 1537 Gross per 24 hour  Intake 285.56 ml  Output -  Net 285.56 ml   Last 3 Weights 09/21/2019 09/20/2019 09/17/2019  Weight (lbs) 152 lb 11.2 oz 151 lb 3.8 oz 153 lb 4.8 oz  Weight (kg) 69.264 kg 68.6 kg 69.536 kg      Telemetry    SR with bursts of PAF this morning - Personally Reviewed  ECG    No new tracing  Physical Exam  Pleasant WM, sitting up in bed. GEN: No acute distress.   Neck: No JVD Cardiac: RRR, no murmurs, rubs, or gallops.  Respiratory: Clear to auscultation bilaterally. GI: Soft, nontender, non-distended  MS: No edema; No deformity. Neuro:  Nonfocal  Psych: Normal affect   Labs    High  Sensitivity Troponin:   Recent Labs  Lab 09/16/19 1336 09/16/19 1519  TROPONINIHS 149* 168*      Chemistry Recent Labs  Lab 09/16/19 1336 09/17/19 0303  09/18/19 0404 09/19/19 0312 09/20/19 0345  NA 136 133*  --  131* 134* 134*  K 4.0 4.9  --  4.6 3.6 3.6  CL 101 100  --  96* 100 99  CO2 22 21*  --  20* 23 22  GLUCOSE 162* 160*  --  180* 118* 102*  BUN 14 17  --  26* 24* 19  CREATININE 0.86 0.92   < > 1.28* 0.91 0.93  CALCIUM 8.7* 8.6*  --  8.5* 8.4* 8.2*  PROT 7.3 7.3  --   --   --   --   ALBUMIN 2.8* 2.7*  --   --   --   --   AST 26 28  --   --   --   --   ALT 16 21  --   --   --   --   ALKPHOS 151* 157*  --   --   --   --   BILITOT 0.6 0.5  --   --   --   --   GFRNONAA >60 >  60   < > 59* >60 >60  GFRAA >60 >60   < > >60 >60 >60  ANIONGAP 13 12  --  15 11 13    < > = values in this interval not displayed.     Hematology Recent Labs  Lab 09/19/19 1314 09/20/19 0345 09/21/19 0316  WBC 9.3 7.6 8.9  RBC 2.81* 2.74* 2.79*  HGB 8.1* 8.0* 8.0*  HCT 25.4* 24.6* 26.0*  MCV 90.4 89.8 93.2  MCH 28.8 29.2 28.7  MCHC 31.9 32.5 30.8  RDW 15.1 15.3 15.9*  PLT 529* 562* 503*    BNP Recent Labs  Lab 09/16/19 2057 09/20/19 0345  BNP 451.3* 604.5*     DDimer No results for input(s): DDIMER in the last 168 hours.   Radiology    No results found.  Cardiac Studies   TTE: 09/17/19  IMPRESSIONS   1. Left ventricular ejection fraction, by visual estimation, is 45 to 50%. The left ventricle has mildly decreased function. There is moderately increased left ventricular hypertrophy.  2. Indeterminate diastolic filling due to E-A fusion pattern of LV diastolic filling.  3. Inferior and inferoseptal hypokinesis  4. Global right ventricle has moderately reduced systolic function.The right ventricular size is small. No increase in right ventricular wall thickness.  5. Hypokinetic right ventricular apex.  6. RV small -apperas to be extrinsically compressed by an  echogeneic fluid collection which is anterior and lateral to the RV.  7. Left atrial size was normal.  8. Right atrial size was normal.  9. The tricuspid valve is abnormal. Tricuspid valve regurgitation is mild. 10. Prosthetic aortic graft in the ascending aorta position. 11. Aortic valve regurgitation was not visualized by color flow Doppler. 12. Aortic valve peak gradient measures 13.1 mmHg. 13. Aortic valve mean gradient measures 7.0 mmHg. 14. 27 mm Konect Resilia Aortic Valve Conduit. 15. The pulmonic valve was grossly normal. Pulmonic valve regurgitation is not visualized by color flow Doppler. 16. The mitral valve is grossly normal. Trace mitral valve regurgitation. 17. The inferior vena cava is dilated in size with <50% respiratory variability, suggesting right atrial pressure of 15 mmHg. 18. Trivial pericardial effusion is present. 19. No evidence of any gross valvular vegetations.  Patient Profile     63 y.o. male CAD s/p CABG x 2 (SVG-OM, SVG-PDA 08/27/19), AAA, bicuspid aortic valve with severe valvular regrugitation s/p bentall procedure, pulmonary hypertension, former smoker, asthma, HTN, HLD, hypothyroidism, and BPH. He presented to [facility] with CP relieved with nitro x 3.   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 Will continue with the same for now.   2. CAD s/p CABG: maintained on ASA (81mg  ?), and statin. BB stopped 2/2 to "lung spasms".   3. Post op anemia: Hgb remains stable. Given dose of feraheme.   4. PAF: developed yesterday morning. Placed on IV amiodarone. Did have a few brief bursts this morning, but currently in SR. Will continue with IV today and plan to transition oral dosing in the morning. 400mg  BID. No plans for [facility] at this time given anemia. Will likely need outpatient monitor when discharged.   Needs to start ambulating in the hallway today. Has not been out of bed in several days. Will order PT eval  today.   For questions or updates, please contact [place] Please consult [email or web address] for contact info under      Signed, Reino Bellis, NP  09/21/2019, 8:59 AM  ATTENDING ATTESTATION  I have seen, examined and evaluated the patient this AM along with Reino Bellis, NP-C.  After reviewing all the available data and chart, we discussed the patients laboratory, study & physical findings as well as symptoms in detail. I agree with her findings, examination as well as impression recommendations as per our discussion.    I saw him walking in the hallway today.  He is extremely fatigued and uncomfortable.  Breathing hard and heavy, but not having significant chest pain currently.  Just uncomfortable.  He is still having some of the spasm symptoms but they seem to have improved.  No longer in A. fib.  Continue IV amiodarone today and switch to oral tomorrow. ->  No oral coagulation for now because of recent bleed consider outpatient monitor to determine if he has any additional A. fib, however with full amiodarone load I would doubt that he would show up any A. fib. Continue colchicine for minimum of 1 month post discharge -since his symptoms seem to be improved, no need for steroids at this time.  Otherwise a stable regimen.  I spent at least 10 to 15 minutes with the patient and his wife discussing Dressler syndrome etc. and potential recovery.    Glenetta Hew, M.D., M.S. Interventional Cardiologist   Pager # [phone] Phone # [phone] [address] [place], [place] [address]

## 2019-09-21 NOTE — Progress Notes (Signed)
Pt ambulated 75 ft with wife in hallway. Pt used rolling walker. Denies needs at this time. Will continue to monitor.

## 2019-09-21 NOTE — Evaluation (Signed)
Physical Therapy Evaluation Patient Details Name: Jeffrey Tucker MRN: [id number] DOB: 1956/11/09 Today's Date: 09/21/2019   History of Present Illness  Pt is a 63 y.o. male with recent CABGx2 and prosthetic valve replacement (08/2019), now admitted 09/16/19 with worsening DOE, chest discomfort and LE swelling. CTA negative for PE, but with increase pericardial effusion. Possible post-cardiotomy syndrome vs infection.  Other PMH includes HTN, asthma, CAD.  Clinical Impression  PT asked to evaluate pt again due to decline in function since original evaluation on 10/22. Upon arrival, pt and wife were ambulating to the bathroom. PT intervention required to keep pt from pulling his IV line out, and hands-on guarding/assist required throughout standing and ambulation in room. Wife reporting to this therapist that she is very concerned about pt being at home alone (while she is at work) at d/c as pt off balance and is at increased risk for falls at this time. Pt reports extreme nausea during session which limited functional mobility. We had an extensive conversation regarding options for continued therapy at d/c. The pt and wife are most interested in short term rehab at the CIR level. Feel he would be able to tolerate the increased intensity of therapy available at CIR when nausea and diarrhea are improved. I do think he could easily get to a mod I level with multidisciplinary rehab, which would allow for pt to be safely alone at home while wife at work. Will continue to follow.   Follow Up Recommendations CIR;Supervision for mobility/OOB    Equipment Recommendations  None recommended by PT    Recommendations for Other Services Rehab consult     Precautions / Restrictions Precautions Precautions: Fall;Sternal Precaution Booklet Issued: No Restrictions Weight Bearing Restrictions: No      Mobility  Bed Mobility               General bed mobility comments: Pt was received ambulating to  the bathroom, and returned to sitting EOB. Pt very nauseated and reports only wanting to sit EOB at this time.   Transfers Overall transfer level: Needs assistance Equipment used: None Transfers: Sit to/from Stand Sit to Stand: Min guard         General transfer comment: Close guard for safety as pt powered up to full standing position. Pt required hands-on assist in static standing due to large sway and reports of nausea/feeling "woozy"  Ambulation/Gait Ambulation/Gait assistance: Min guard;Min assist Gait Distance (Feet): 30 Feet Assistive device: None Gait Pattern/deviations: Step-through pattern;Decreased stride length;Trunk flexed Gait velocity: Decreased Gait velocity interpretation: <1.31 ft/sec, indicative of household ambulator General Gait Details: In room only, and distance limited by nausea. Pt requiring hands-on assist for balance support without an AD. Pt moving slow and with lateral sway. VSS upon return to seated position at [employer] Mobility    Modified Rankin (Stroke Patients Only)       Balance Overall balance assessment: Needs assistance Sitting-balance support: Feet supported;No upper extremity supported Sitting balance-Leahy Scale: Fair     Standing balance support: No upper extremity supported;During functional activity Standing balance-Leahy Scale: Poor Standing balance comment: Fair to poor - occasional min assist for balance during ambulation.                              Pertinent Vitals/Pain Pain Assessment: Faces Faces Pain Scale: No hurt Pain Location: Pt does not report  pain but obviously uncomfortable due to nausea throughout session.  Pain Descriptors / Indicators: Discomfort(Nausea) Pain Intervention(s): Limited activity within patient's tolerance    Home Living Family/patient expects to be discharged to:: Private residence Living Arrangements: Spouse/significant other Available Help at  Discharge: Family;Available 24 hours/day Type of Home: House Home Access: Stairs to enter     Home Layout: One level        Prior Function Level of Independence: Independent         Comments: Wife reporting that prior to surgery pt was independent and exercising daily.      Hand Dominance        Extremity/Trunk Assessment   Upper Extremity Assessment Upper Extremity Assessment: Defer to OT evaluation    Lower Extremity Assessment Lower Extremity Assessment: Generalized weakness(Gross strength and muscle endurance decreased)    Cervical / Trunk Assessment Cervical / Trunk Assessment: Other exceptions Cervical / Trunk Exceptions: Forward head posture with rounded shoulders  Communication   Communication: No difficulties  Cognition Arousal/Alertness: Awake/alert Behavior During Therapy: Flat affect Overall Cognitive Status: Within Functional Limits for tasks assessed                                        General Comments      Exercises     Assessment/Plan    PT Assessment Patient needs continued PT services  PT Problem List Decreased strength;Decreased activity tolerance;Decreased balance;Decreased mobility;Decreased knowledge of use of DME;Decreased safety awareness;Decreased knowledge of precautions;Cardiopulmonary status limiting activity       PT Treatment Interventions DME instruction;Gait training;Functional mobility training;Stair training;Therapeutic activities;Therapeutic exercise;Neuromuscular re-education;Patient/family education    PT Goals (Current goals can be found in the Care Plan section)  Acute Rehab PT Goals Patient Stated Goal: Return to PLOF PT Goal Formulation: With patient/family Time For Goal Achievement: 10/05/19 Potential to Achieve Goals: Good    Frequency Min 3X/week   Barriers to discharge        Co-evaluation               AM-PAC PT "6 Clicks" Mobility  Outcome Measure Help needed turning from  your back to your side while in a flat bed without using bedrails?: A Little Help needed moving from lying on your back to sitting on the side of a flat bed without using bedrails?: A Little Help needed moving to and from a bed to a chair (including a wheelchair)?: A Little Help needed standing up from a chair using your arms (e.g., wheelchair or bedside chair)?: A Little Help needed to walk in hospital room?: A Little Help needed climbing 3-5 steps with a railing? : A Lot 6 Click Score: 17    End of Session   Activity Tolerance: Patient tolerated treatment well Patient left: with call bell/phone within reach;with family/visitor present(Sitting EOB) Nurse Communication: Mobility status PT Visit Diagnosis: Unsteadiness on feet (R26.81);Muscle weakness (generalized) (M62.81)    Time: [id number] PT Time Calculation (min) (ACUTE ONLY): 25 min   Charges:   PT Evaluation $PT Eval Moderate Complexity: 1 Mod PT Treatments $Gait Training: 8-22 mins        Rolinda Roan, PT, DPT Acute Rehabilitation Services Pager: [phone] Office: [phone]   Thelma Comp 09/21/2019, 3:38 PM

## 2019-09-21 NOTE — Progress Notes (Signed)
Rehab Admissions Coordinator Note:  Per PT recommendation, patient was screened by Michel Santee for appropriateness for an Inpatient Acute Rehab Consult.  Pt may be too high level for CIR program. Would recommend OT consult and I will follow for 1 more day to observe progress with therapies before requesting an order for CIR consult.   Michel Santee 09/21/2019, 4:09 PM  I can be reached at [phone].

## 2019-09-22 ENCOUNTER — Ambulatory Visit: Payer: Self-pay | Admitting: Cardiovascular Disease

## 2019-09-22 ENCOUNTER — Telehealth: Payer: Self-pay | Admitting: Internal Medicine

## 2019-09-22 DIAGNOSIS — D508 Other iron deficiency anemias: Secondary | ICD-10-CM

## 2019-09-22 LAB — CBC WITH DIFFERENTIAL/PLATELET
Abs Immature Granulocytes: 0.07 10*3/uL (ref 0.00–0.07)
Basophils Absolute: 0 10*3/uL (ref 0.0–0.1)
Basophils Relative: 0 %
Eosinophils Absolute: 0.1 10*3/uL (ref 0.0–0.5)
Eosinophils Relative: 1 %
HCT: 27.9 % — ABNORMAL LOW (ref 39.0–52.0)
Hemoglobin: 8.6 g/dL — ABNORMAL LOW (ref 13.0–17.0)
Immature Granulocytes: 1 %
Lymphocytes Relative: 12 %
Lymphs Abs: 1.1 10*3/uL (ref 0.7–4.0)
MCH: 28.8 pg (ref 26.0–34.0)
MCHC: 30.8 g/dL (ref 30.0–36.0)
MCV: 93.3 fL (ref 80.0–100.0)
Monocytes Absolute: 0.9 10*3/uL (ref 0.1–1.0)
Monocytes Relative: 10 %
Neutro Abs: 7.2 10*3/uL (ref 1.7–7.7)
Neutrophils Relative %: 76 %
Platelets: 534 10*3/uL — ABNORMAL HIGH (ref 150–400)
RBC: 2.99 MIL/uL — ABNORMAL LOW (ref 4.22–5.81)
RDW: 16.4 % — ABNORMAL HIGH (ref 11.5–15.5)
WBC: 9.4 10*3/uL (ref 4.0–10.5)
nRBC: 0.2 % (ref 0.0–0.2)

## 2019-09-22 LAB — BASIC METABOLIC PANEL
Anion gap: 10 (ref 5–15)
BUN: 9 mg/dL (ref 8–23)
CO2: 21 mmol/L — ABNORMAL LOW (ref 22–32)
Calcium: 8.3 mg/dL — ABNORMAL LOW (ref 8.9–10.3)
Chloride: 104 mmol/L (ref 98–111)
Creatinine, Ser: 0.9 mg/dL (ref 0.61–1.24)
GFR calc Af Amer: >60 mL/min (ref >60)
GFR calc non Af Amer: >60 mL/min (ref >60)
Glucose, Bld: 100 mg/dL — ABNORMAL HIGH (ref 70–99)
Potassium: 3.8 mmol/L (ref 3.5–5.1)
Sodium: 135 mmol/L (ref 135–145)

## 2019-09-22 LAB — MAGNESIUM: Magnesium: 2.1 mg/dL (ref 1.7–2.4)

## 2019-09-22 MED ORDER — AMIODARONE HCL 200 MG PO TABS
400.0000 mg | ORAL_TABLET | Freq: Two times a day (BID) | ORAL | Status: DC
  Administered 2019-09-22 (×2): 400 mg via ORAL
  Filled 2019-09-22 (×2): qty 2

## 2019-09-22 MED ORDER — MELATONIN 3 MG PO TABS
3.0000 mg | ORAL_TABLET | Freq: Every evening | ORAL | Status: DC | PRN
  Administered 2019-09-22: 3 mg via ORAL
  Filled 2019-09-22 (×2): qty 1

## 2019-09-22 NOTE — Telephone Encounter (Signed)
°  Pt given neg COVID results

## 2019-09-22 NOTE — Progress Notes (Signed)
Pt ambulating hall with wife using RW. 

## 2019-09-22 NOTE — Progress Notes (Signed)
PROGRESS NOTE  Jeffrey Tucker [id number] DOB: [birth year]   PCP: Venia Carbon, MD  Patient is from: Home  DOA: 09/16/2019 LOS: 6  Brief Narrative / Interim history: 63 year old male with history of CAD/CABG x2, AAA repair and AVR (Bentall procedure) on 08/27/2019, pulmonary HTN, former smoker, asthma, HTN, HLD, hypothyroidism and BPH presenting with chest discomfort, generalized weakness, episodic "lung spasm"/orthopnea?, edema, fever to 102 and dry cough.   In ED, HR 110s.  RR in 88s.  BP normal.  Saturation upper 90s on room air.  WBC 13.  Hgb 8.1 (baseline).  Platelets 630.  Mild troponin elevation to 149> 168.  BNP 450.  CRP 16.4.  ESR 89.  TSH 10.  EKG without acute ischemic finding.  CXR with small left pleural effusion and associated LLL opacity.  CTA chest moderate cardiomegaly with evidence of right heart dysfunction, slight interval increase in the amount of pericardial effusion, 5 x 5 cm ill-defined collection anterior inferior to the heart and posterior to sternotomy suggestive for postsurgical seroma, improved aeration of lungs with decrease in consolidative changes of LLE compared to prior CT and no PE.  Cardiology and CTS consulted and patient was admitted for post cardiotomy syndrome.  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  He then went into atrial fibrillation with RVR, new onset on the morning of 09/20/2019.  Cardiology started him on amiodarone drip which was switched to oral amiodarone today.  Assessment & Plan: Chest discomfort/dyspnea/"lung spasm": unclear etiology of this.  Differential diagnosis include post cardiotomy syndrome/Dressler syndrome, pneumonia, pericarditis, CHF exacerbation, symptomatic anemia, anxiety and asthma.  Patient has had fever, chills, leukocytosis, elevated inflammatory markers and some LLL consolidative changes on CT although improved concerning for PNA.  PCT elevated.  With pericardial effusion, concern  about pericarditis as well. He also have orthopnea, edema,  elevated BNP and troponin concerning for CHF.  Echo shows 45 to 50% ejection fraction.  No wall motion abnormality. -Appreciate cardiology and CTS guidance. -Cardiology started on colchicine 0.6 mg twice daily for possible pericarditis.Faythe Ghee to use steroid if needed per CTS. -Continue ceftriaxone and azithromycin 10/22>> for pneumonia.  Should stop after tomorrow's dose when 7 days are completed. -Budesonide, DuoNeb, PPI, as needed Ativan  Acute Systolic ION:GEXB with EF of 45 to 50%, moderate LVH, moderately reduced RV EF, fluid collection compressing on RV.  Received IV Lasix 40 mg once on 10/22.  Cr bumped up.  Soft blood pressures.  Lasix discontinued.  He is on room air. Management per cardiology.  CAD/CABG x2, AAA repair and AVR on 08/27/2019: CTA negative for PE but some pericardial effusion.  No signs of tamponade.  Echocardiogram as above.  Metoprolol discontinued due to ?"lung spasm" -Continue statins. -Soft blood pressures to reinitiate BB. -Resumed aspirin few days ago.  Acute/subacute blood loss anemia: Hgb 8.1>> 7.6>1u>8.5>7.8.  Baseline 7-8.  No signs of active bleed.  Anemia panel consistent with iron deficiency.  FOBT negative x2 -Recheck CBC in the afternoon -Infuse Feraheme 510 mg once -Continue p.o. iron Aspirin resumed on 09/20/2019.  New onset atrial fibrillation with RVR: Started on the morning of 09/20/2019 and converted back to sinus rhythm soon after started on amiodarone drip by cardiology.  Amiodarone has been switched to oral today.  Remains in sinus rhythm.  Per cardiology, not a candidate for anticoagulation due to anemia.  Appreciate cardiology help.  AKI: Resolved.  Was likely due to IV Lasix. Monitor daily.  Mild hyponatremia: Improved. -Continue  monitoring  Right lower extremity swelling s/p SVG harvest.  Bilateral Doppler negative for DVT.  No calf tenderness. -Encourage leg elevation -We  will try TED hose.  Mild intermittent asthma: No wheezing.  Unclear explanation for his "lung spasm" but improving.  Phrenic nerve irritation? -Breathing treatments as above. -Continue Tessalon Perles  Essential hypertension: Blood pressure controlled.  Continue to monitor.  Hypothyroidism: TSH marginally elevated.  -Continue home Synthroid -Recheck TSH in 4 to 6 weeks outpatient.   Thrombocytosis: Reactive? -Continue monitoring.  Mildly elevated alkaline phosphatase -Continue trending.  DVT prophylaxis: SCD Code Status: Full code Family Communication: No family present at bedside.  Plan of care discussed with patient.  Answered questions. Disposition Plan: Evaluated by PT OT.  They recommend CIR.  Pending CIR evaluation. Consultants: Cardiology, CTS   Subjective: Patient seen and examined.  He states that he feels much better than yesterday, less weak.  No chest pain or shortness of breath.  Objective: Vitals:   09/22/19 0428 09/22/19 0800 09/22/19 0914 09/22/19 1130  BP: 123/90 108/78  (!) 123/94  Pulse:  78  82  Resp: '18 19 17 '$ (!) 22  Temp: 98 F (36.7 C) 98.2 F (36.8 C)  98.3 F (36.8 C)  TempSrc: Oral Oral  Oral  SpO2: 100% 96% 95% 97%  Weight: 69.8 kg     Height:        Intake/Output Summary (Last 24 hours) at 09/22/2019 1304 Last data filed at 09/22/2019 0400 Gross per 24 hour  Intake 595.7 ml  Output -  Net 595.7 ml   Filed Weights   09/20/19 0500 09/21/19 0500 09/22/19 0428  Weight: 68.6 kg 69.3 kg 69.8 kg    Examination:  General exam: Appears calm and comfortable  Respiratory system: Clear to auscultation. Respiratory effort normal. Cardiovascular system: S1 & S2 heard, RRR. No JVD, murmurs, rubs, gallops or clicks. No pedal edema. Gastrointestinal system: Abdomen is nondistended, soft and nontender. No organomegaly or masses felt. Normal bowel sounds heard. Central nervous system: Alert and oriented. No focal neurological deficits.  Extremities: Symmetric 5 x 5 power. Skin: No rashes, lesions or ulcers.  Psychiatry: Judgement and insight appear normal. Mood & affect appropriate.   Procedures:  None  Microbiology summarized: [id number] negative. Blood cultures negative so far.  Sch Meds:  Scheduled Meds: . amiodarone  400 mg Oral BID  . aspirin EC  81 mg Oral Daily  . atorvastatin  80 mg Oral q1800  . benzonatate  100 mg Oral TID  . budesonide (PULMICORT) nebulizer solution  0.5 mg Nebulization BID  . colchicine  0.6 mg Oral BID  . ferrous sulfate  325 mg Oral Q breakfast  . ipratropium-albuterol  3 mL Nebulization BID  . levothyroxine  125 mcg Oral Daily  . pantoprazole  40 mg Oral BID   Continuous Infusions: . cefTRIAXone (ROCEPHIN)  IV Stopped (09/21/19 1352)   PRN Meds:.acetaminophen **OR** acetaminophen, ipratropium-albuterol, Melatonin, morphine injection, ondansetron **OR** ondansetron (ZOFRAN) IV, polyethylene glycol, traMADol  Antimicrobials: Anti-infectives (From admission, onward)   Start     Dose/Rate Route Frequency Ordered Stop   09/18/19 1000  azithromycin (ZITHROMAX) tablet 250 mg     250 mg Oral Daily 09/17/19 1208 09/21/19 0854   09/17/19 1300  cefTRIAXone (ROCEPHIN) 2 g in sodium chloride 0.9 % 100 mL IVPB     2 g 200 mL/hr over 30 Minutes Intravenous Every 24 hours 09/17/19 1208     09/17/19 1215  azithromycin (ZITHROMAX) tablet 500 mg  500 mg Oral Daily 09/17/19 1208 09/17/19 1319       I have personally reviewed the following labs and images: CBC: Recent Labs  Lab 09/16/19 1336  09/19/19 0312 09/19/19 1314 09/20/19 0345 09/21/19 0316 09/22/19 0625  WBC 13.0*   < > 8.9 9.3 7.6 8.9 9.4  NEUTROABS 10.9*  --   --   --   --  5.9 7.2  HGB 8.1*   < > 7.8* 8.1* 8.0* 8.0* 8.6*  HCT 27.0*   < > 24.7* 25.4* 24.6* 26.0* 27.9*  MCV 94.4   < > 90.1 90.4 89.8 93.2 93.3  PLT 630*   < > 519* 529* 562* 503* 534*   < > = values in this interval not displayed.   BMP &GFR Recent  Labs  Lab 09/16/19 2057  09/18/19 0404 09/19/19 0312 09/20/19 0345 09/21/19 0316 09/22/19 0625  NA  --    < > 131* 134* 134* 135 135  K  --    < > 4.6 3.6 3.6 3.1* 3.8  CL  --    < > 96* 100 99 100 104  CO2  --    < > 20* '23 22 22 '$ 21*  GLUCOSE  --    < > 180* 118* 102* 72 100*  BUN  --    < > 26* 24* '19 16 9  '$ CREATININE  --    < > 1.28* 0.91 0.93 0.93 0.90  CALCIUM  --    < > 8.5* 8.4* 8.2* 8.2* 8.3*  MG 2.1  --  2.1 2.2 2.3 2.1 2.1  PHOS 5.2*  --   --   --   --   --   --    < > = values in this interval not displayed.   Estimated Creatinine Clearance: 75.8 mL/min (by C-G formula based on SCr of 0.9 mg/dL). Liver & Pancreas: Recent Labs  Lab 09/16/19 1336 09/17/19 0303  AST 26 28  ALT 16 21  ALKPHOS 151* 157*  BILITOT 0.6 0.5  PROT 7.3 7.3  ALBUMIN 2.8* 2.7*   No results for input(s): LIPASE, AMYLASE in the last 168 hours. No results for input(s): AMMONIA in the last 168 hours. Diabetic: No results for input(s): HGBA1C in the last 72 hours. No results for input(s): GLUCAP in the last 168 hours. Cardiac Enzymes: No results for input(s): CKTOTAL, CKMB, CKMBINDEX, TROPONINI in the last 168 hours. No results for input(s): PROBNP in the last 8760 hours. Coagulation Profile: No results for input(s): INR, PROTIME in the last 168 hours. Thyroid Function Tests: No results for input(s): TSH, T4TOTAL, FREET4, T3FREE, THYROIDAB in the last 72 hours. Lipid Profile: No results for input(s): CHOL, HDL, LDLCALC, TRIG, CHOLHDL, LDLDIRECT in the last 72 hours. Anemia Panel: No results for input(s): VITAMINB12, FOLATE, FERRITIN, TIBC, IRON, RETICCTPCT in the last 72 hours. Urine analysis:    Component Value Date/Time   COLORURINE YELLOW 08/25/2019 0902   APPEARANCEUR CLEAR 08/25/2019 0902   APPEARANCEUR Cloudy (A) 06/19/2017 0828   LABSPEC 1.021 08/25/2019 0902   PHURINE 5.0 08/25/2019 0902   GLUCOSEU NEGATIVE 08/25/2019 0902   HGBUR NEGATIVE 08/25/2019 0902   BILIRUBINUR  NEGATIVE 08/25/2019 0902   BILIRUBINUR Negative 06/19/2017 0828   KETONESUR NEGATIVE 08/25/2019 0902   PROTEINUR NEGATIVE 08/25/2019 0902   NITRITE NEGATIVE 08/25/2019 0902   LEUKOCYTESUR NEGATIVE 08/25/2019 0902   Sepsis Labs: Invalid input(s): PROCALCITONIN, LACTICIDVEN  Microbiology: Recent Results (from the past 240 hour(s))  Novel Coronavirus, NAA ([facility])  Status: None   Collection Time: 09/15/19 10:50 AM   Specimen: Oropharyngeal(OP) collection in vial transport medium   OROPHARYNGEA  TESTING  Result Value Ref Range Status   SARS-CoV-2, NAA Not Detected Not Detected Final    Comment: This nucleic acid amplification test was developed and its performance characteristics determined by [employer]. Nucleic acid amplification tests include PCR and TMA. This test has not been FDA cleared or approved. This test has been authorized by FDA under an Emergency Use Authorization (EUA). This test is only authorized for the duration of time the declaration that circumstances exist justifying the authorization of the emergency use of in vitro diagnostic tests for detection of SARS-CoV-2 virus and/or diagnosis of COVID-19 infection under section 564(b)(1) of the Act, 21 U.S.C. 211HER-7(E) (1), unless the authorization is terminated or revoked sooner. When diagnostic testing is negative, the possibility of a false negative result should be considered in the context of a patient's recent exposures and the presence of clinical signs and symptoms consistent with COVID-19. An individual without symptoms of COVID-19 and who is not shedding SARS-CoV-2 virus would  expect to have a negative (not detected) result in this assay.   SARS CORONAVIRUS 2 (TAT 6-24 HRS) Nasopharyngeal Nasopharyngeal Swab     Status: None   Collection Time: 09/16/19  6:00 PM   Specimen: Nasopharyngeal Swab  Result Value Ref Range Status   SARS Coronavirus 2 NEGATIVE NEGATIVE Final    Comment: (NOTE)  SARS-CoV-2 target nucleic acids are NOT DETECTED. The SARS-CoV-2 RNA is generally detectable in upper and lower respiratory specimens during the acute phase of infection. Negative results do not preclude SARS-CoV-2 infection, do not rule out co-infections with other pathogens, and should not be used as the sole basis for treatment or other patient management decisions. Negative results must be combined with clinical observations, patient history, and epidemiological information. The expected result is Negative. Fact Sheet for Patients: SugarRoll.be Fact Sheet for Healthcare Providers: https://www.woods-mathews.com/ This test is not yet approved or cleared by the [place] FDA and  has been authorized for detection and/or diagnosis of SARS-CoV-2 by FDA under an Emergency Use Authorization (EUA). This EUA will remain  in effect (meaning this test can be used) for the duration of the COVID-19 declaration under Section 56 4(b)(1) of the Act, 21 U.S.C. section 360bbb-3(b)(1), unless the authorization is terminated or revoked sooner. Performed at [facility], [place] [address], [place], [place] [address]   Culture, blood (routine x 2)     Status: None   Collection Time: 09/16/19  8:57 PM   Specimen: BLOOD  Result Value Ref Range Status   Specimen Description BLOOD RIGHT ANTECUBITAL  Final   Special Requests   Final    BOTTLES DRAWN AEROBIC AND ANAEROBIC Blood Culture adequate volume   Culture   Final    NO GROWTH 5 DAYS Performed at [facility], [place] [address], [place], [place] [address]    Report Status 09/21/2019 FINAL  Final  Culture, blood (routine x 2)     Status: None   Collection Time: 09/16/19  9:08 PM   Specimen: BLOOD RIGHT HAND  Result Value Ref Range Status   Specimen Description BLOOD RIGHT HAND  Final   Special Requests   Final    BOTTLES DRAWN AEROBIC AND ANAEROBIC Blood Culture results may not be optimal due  to an inadequate volume of blood received in culture bottles   Culture   Final    NO  GROWTH 5 DAYS Performed at [facility], [place] [address] [place], [place] [address]    Report Status 09/21/2019 FINAL  Final    Radiology Studies: No results found.  Total time spent 28 minutes  Darliss Cheney, MD [facility]  If 7PM-7AM, please contact night-coverage [email or web address] Password [email or web address] 09/22/2019, 1:04 PM

## 2019-09-22 NOTE — Progress Notes (Signed)
Inpatient Rehab Admissions:  Inpatient Rehab Consult received.  Pt currently too high level for CIR program (min guard [phone]').  Would recommend SNF if pt does not feel he can d/c home with HHPT.  Will sign off at this time.   Shann Medal, PT, DPT Admissions Coordinator [phone] 09/22/19  3:11 PM

## 2019-09-22 NOTE — Progress Notes (Signed)
Physical Therapy Treatment Patient Details Name: Jeffrey Tucker MRN: [id number] DOB: [birth year] Today's Date: 09/22/2019    History of Present Illness Pt is a 63 y.o. male with recent CABGx2 and prosthetic valve replacement (08/2019), now admitted 09/16/19 with worsening DOE, chest discomfort and LE swelling. CTA negative for PE, but with increase pericardial effusion. Possible post-cardiotomy syndrome vs infection.  Other PMH includes HTN, asthma, CAD.    PT Comments    Patient progressing well towards PT goals. Tolerated transfers and gait training with close min guard for balance/safety. Needs cues to use RW for safety as pt tends to forget it when walking despite acknowledging his balance is not great. Reviewed sternal precautions, exercise recommendations, concerns about safety etc. Recommend use of RW at all times during mobility. Pt demonstrates poor awareness of safety and ability to self monitor symptoms.  VSS throughout with 2/4 DOE during activity. If pt is denied CIR, recommend HHPT as pt is home alone (wife works) and demonstrates safety concerns, impaired balance putting pt at increased risk for falls. Will follow.   Follow Up Recommendations  CIR;Supervision for mobility/OOB     Equipment Recommendations  None recommended by PT    Recommendations for Other Services       Precautions / Restrictions Precautions Precautions: Fall;Sternal Precaution Booklet Issued: No Restrictions Weight Bearing Restrictions: Yes Other Position/Activity Restrictions: sternal precautions    Mobility  Bed Mobility               General bed mobility comments: Sitting in chair upon PT arrival.  Transfers Overall transfer level: Needs assistance Equipment used: Rolling walker (2 wheeled);None Transfers: Sit to/from Stand Sit to Stand: Min guard         General transfer comment: Close guard for safety as pt powered up to full standing position without use of hands. Stood from  [employer], from toilet x1.  Pt required hands-on assist in static standing due to bumping LE on RW leg and imbalance.  Ambulation/Gait Ambulation/Gait assistance: Min guard Gait Distance (Feet): 475 Feet Assistive device: Rolling walker (2 wheeled) Gait Pattern/deviations: Step-through pattern;Decreased stride length;Trunk flexed Gait velocity: Decreased   General Gait Details: Slow, mildly unsteady gait with a few instances of imbalance needing close Min guard for safety. VSS throughout. No dizziness. Heavy breathing noted at times, forced standing rest breaks.   Stairs             Wheelchair Mobility    Modified Rankin (Stroke Patients Only)       Balance Overall balance assessment: Needs assistance Sitting-balance support: Feet supported;No upper extremity supported Sitting balance-Leahy Scale: Fair     Standing balance support: During functional activity Standing balance-Leahy Scale: Fair Standing balance comment: Fair to poor - occasional min assist for balance during ambulation.                             Cognition Arousal/Alertness: Awake/alert Behavior During Therapy: Flat affect Overall Cognitive Status: Impaired/Different from baseline Area of Impairment: Awareness;Safety/judgement                         Safety/Judgement: Decreased awareness of safety Awareness: Emergent   General Comments: Poor awareness of safety/balance, pushing RW out of the way when walking to bathroom and almost tripping over RW leg. Needs cues for self monitoring symptoms. Tearful upon arrival, "I thought i was gone the last 2 days."  Exercises      General Comments        Pertinent Vitals/Pain Pain Assessment: No/denies pain    Home Living                      Prior Function            PT Goals (current goals can now be found in the care plan section) Progress towards PT goals: Progressing toward goals    Frequency    Min  3X/week      PT Plan Current plan remains appropriate    Co-evaluation              AM-PAC PT "6 Clicks" Mobility   Outcome Measure  Help needed turning from your back to your side while in a flat bed without using bedrails?: A Little Help needed moving from lying on your back to sitting on the side of a flat bed without using bedrails?: A Little Help needed moving to and from a bed to a chair (including a wheelchair)?: A Little Help needed standing up from a chair using your arms (e.g., wheelchair or bedside chair)?: A Little Help needed to walk in hospital room?: A Little Help needed climbing 3-5 steps with a railing? : A Lot 6 Click Score: 17    End of Session Equipment Utilized During Treatment: Gait belt Activity Tolerance: Patient tolerated treatment well Patient left: in chair;with call bell/phone within reach Nurse Communication: Mobility status PT Visit Diagnosis: Unsteadiness on feet (R26.81);Muscle weakness (generalized) (M62.81)     Time: [id number] PT Time Calculation (min) (ACUTE ONLY): 41 min  Charges:  $Gait Training: 8-22 mins $Therapeutic Activity: 8-22 mins $Self Care/Home Management: 8-22                     Wray Kearns, PT, DPT Acute Rehabilitation Services Pager [phone] Office [place] 09/22/2019, 1:22 PM

## 2019-09-22 NOTE — Progress Notes (Addendum)
Progress Note  Patient Name: Jeffrey Tucker Date of Encounter: 09/22/2019  Primary Cardiologist: Ida Rogue, MD   Subjective   Sitting up in chair. Feeling much better this morning.  Inpatient Medications    Scheduled Meds: . amiodarone  400 mg Oral BID  . aspirin EC  81 mg Oral Daily  . atorvastatin  80 mg Oral q1800  . benzonatate  100 mg Oral TID  . budesonide (PULMICORT) nebulizer solution  0.5 mg Nebulization BID  . colchicine  0.6 mg Oral BID  . ferrous sulfate  325 mg Oral Q breakfast  . ipratropium-albuterol  3 mL Nebulization BID  . levothyroxine  125 mcg Oral Daily  . pantoprazole  40 mg Oral BID   Continuous Infusions: . cefTRIAXone (ROCEPHIN)  IV Stopped (09/21/19 1352)   PRN Meds: acetaminophen **OR** acetaminophen, ipratropium-albuterol, Melatonin, morphine injection, ondansetron **OR** ondansetron (ZOFRAN) IV, polyethylene glycol, traMADol   Vital Signs    Vitals:   09/21/19 2325 09/22/19 0428 09/22/19 0800 09/22/19 0914  BP: 107/85 123/90 108/78   Pulse:   78   Resp: 19 18 19 17   Temp: 97.6 F (36.4 C) 98 F (36.7 C) 98.2 F (36.8 C)   TempSrc: Oral Oral Oral   SpO2: 99% 100% 96% 95%  Weight:  69.8 kg    Height:        Intake/Output Summary (Last 24 hours) at 09/22/2019 1105 Last data filed at 09/22/2019 0400 Gross per 24 hour  Intake 595.7 ml  Output -  Net 595.7 ml   Last 3 Weights 09/22/2019 09/21/2019 09/20/2019  Weight (lbs) 153 lb 14.1 oz 152 lb 11.2 oz 151 lb 3.8 oz  Weight (kg) 69.8 kg 69.264 kg 68.6 kg      Telemetry    SR - Personally Reviewed  ECG    SR with LVH - Personally Reviewed  Physical Exam  Thin, WM sitting up in chair.  GEN: No acute distress.   Neck: No JVD Cardiac: RRR, no murmurs, rubs, or gallops.  Respiratory: Clear to auscultation bilaterally. GI: Soft, nontender, non-distended  MS: No edema; No deformity. Neuro:  Nonfocal  Psych: Normal affect   Labs    High Sensitivity Troponin:    Recent Labs  Lab 09/16/19 1336 09/16/19 1519  TROPONINIHS 149* 168*      Chemistry Recent Labs  Lab 09/16/19 1336 09/17/19 0303  09/20/19 0345 09/21/19 0316 09/22/19 0625  NA 136 133*   < > 134* 135 135  K 4.0 4.9   < > 3.6 3.1* 3.8  CL 101 100   < > 99 100 104  CO2 22 21*   < > 22 22 21*  GLUCOSE 162* 160*   < > 102* 72 100*  BUN 14 17   < > 19 16 9   CREATININE 0.86 0.92   < > 0.93 0.93 0.90  CALCIUM 8.7* 8.6*   < > 8.2* 8.2* 8.3*  PROT 7.3 7.3  --   --   --   --   ALBUMIN 2.8* 2.7*  --   --   --   --   AST 26 28  --   --   --   --   ALT 16 21  --   --   --   --   ALKPHOS 151* 157*  --   --   --   --   BILITOT 0.6 0.5  --   --   --   --  GFRNONAA >60 >60   < > >60 >60 >60  GFRAA >60 >60   < > >60 >60 >60  ANIONGAP 13 12   < > 13 13 10    < > = values in this interval not displayed.     Hematology Recent Labs  Lab 09/20/19 0345 09/21/19 0316 09/22/19 0625  WBC 7.6 8.9 9.4  RBC 2.74* 2.79* 2.99*  HGB 8.0* 8.0* 8.6*  HCT 24.6* 26.0* 27.9*  MCV 89.8 93.2 93.3  MCH 29.2 28.7 28.8  MCHC 32.5 30.8 30.8  RDW 15.3 15.9* 16.4*  PLT 562* 503* 534*    BNP Recent Labs  Lab 09/16/19 2057 09/20/19 0345  BNP 451.3* 604.5*     DDimer No results for input(s): DDIMER in the last 168 hours.   Radiology    No results found.  Cardiac Studies   TTE: 09/17/19  IMPRESSIONS  1. Left ventricular ejection fraction, by visual estimation, is 45 to 50%. The left ventricle has mildly decreased function. There is moderately increased left ventricular hypertrophy. 2. Indeterminate diastolic filling due to E-A fusion pattern of LV diastolic filling. 3. Inferior and inferoseptal hypokinesis 4. Global right ventricle has moderately reduced systolic function.The right ventricular size is small. No increase in right ventricular wall thickness. 5. Hypokinetic right ventricular apex. 6. RV small -apperas to be extrinsically compressed by an echogeneic fluid collection  which is anterior and lateral to the RV. 7. Left atrial size was normal. 8. Right atrial size was normal. 9. The tricuspid valve is abnormal. Tricuspid valve regurgitation is mild. 10. Prosthetic aortic graft in the ascending aorta position. 11. Aortic valve regurgitation was not visualized by color flow Doppler. 12. Aortic valve peak gradient measures 13.1 mmHg. 13. Aortic valve mean gradient measures 7.0 mmHg. 14. 27 mm Konect Resilia Aortic Valve Conduit. 15. The pulmonic valve was grossly normal. Pulmonic valve regurgitation is not visualized by color flow Doppler. 16. The mitral valve is grossly normal. Trace mitral valve regurgitation. 17. The inferior vena cava is dilated in size with <50% respiratory variability, suggesting right atrial pressure of 15 mmHg. 18. Trivial pericardial effusion is present. 19. No evidence of any gross valvular vegetations.  Patient Profile     63 y.o. male  CAD s/p CABG x 2 (SVG-OM, SVG-PDA 08/27/19), AAA, bicuspid aortic valve with severe valvular regrugitation s/p bentall procedure, pulmonary hypertension, former smoker, asthma, HTN, HLD, hypothyroidism, and BPH. He presented to [facility] with CP relieved with nitro x 3.   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 Will continue with the same for now. Plan for at least one month.   2. CAD s/p CABG: maintained on ASA, and statin. BB stopped 2/2 to "lung spasms".   3. Post op anemia: Hgb remains stable with mild improvemeny. Given dose of feraheme this admission.   4. PAF: developed the morning of 10/25. Placed on IV amiodarone. Did have a few brief bursts yesterday morning, but currently maintaining SR.  -- convert to PO amiodarone 400mg  BID with plans to taper and possibly DC after one month. No plans for [facility] at this time given anemia. Will likely need outpatient monitor when discharged.    For questions or updates, please contact [place] Please consult [email or web address] for contact info under        Signed, Reino Bellis, NP  09/22/2019, 11:05 AM     ATTENDING ATTESTATION  I have seen, examined and evaluated the patient  this PM along with Reino Bellis, NP-C .  After reviewing all the available data and chart, we discussed the patients laboratory, study & physical findings as well as symptoms in detail. I agree with her findings, examination as well as impression recommendations as per our discussion.    Attending adjustments noted in italics.    Ronalee Belts looks much better today.  Feels better.  Better mood better outlook.  Was able to walk more.  He still does have the spasm symptoms when he lays down but otherwise is doing fine.  No further A. fib.  Now on oral amiodarone. ->  Plan 400 mg p.o. twice daily for 5 days starting today, then 400 mg daily for 5 days followed by 20 mg daily for roughly 1 month.  Determine potential need for oral anticoagulation after that evaluation.  Agree with outpatient monitor.  No further anginal symptoms.  Pericardiac pain also seems to be well controlled on colchicine.  The biggest deterrent for his discharge is him regaining strength.  My anticipation is that he is probably ready from a cardiac standpoint for discharge by tomorrow.  There is question about maybe he would benefit from outpatient PT rehab prior to starting cardiac rehab.  Will defer to PT evaluation.  We will check in on him tomorrow, but anticipate that from a cardiology standpoint he be ready for discharge tomorrow no further medication adjustments at this time.  [facility] will sign off.   Anticipate that he should be ready for discharge as early as tomorrow Medication Recommendations: See note above about amiodarone titration.  We will have our cardiology pharmacist review medicines with him. Other recommendations (labs, testing, etc): We will arrange for outpatient 30-day event monitor Follow up as an  outpatient: Will be arranged for APP followed by MD follow-up    Glenetta Hew, M.D., M.S. Interventional Cardiologist   Pager # [phone] Phone # [phone] [address] [place], [place] [address]

## 2019-09-23 DIAGNOSIS — I498 Other specified cardiac arrhythmias: Secondary | ICD-10-CM

## 2019-09-23 DIAGNOSIS — R197 Diarrhea, unspecified: Secondary | ICD-10-CM

## 2019-09-23 LAB — CBC WITH DIFFERENTIAL/PLATELET
Abs Immature Granulocytes: 0.07 10*3/uL (ref 0.00–0.07)
Basophils Absolute: 0.1 10*3/uL (ref 0.0–0.1)
Basophils Relative: 1 %
Eosinophils Absolute: 0.1 10*3/uL (ref 0.0–0.5)
Eosinophils Relative: 1 %
HCT: 30.3 % — ABNORMAL LOW (ref 39.0–52.0)
Hemoglobin: 9.1 g/dL — ABNORMAL LOW (ref 13.0–17.0)
Immature Granulocytes: 1 %
Lymphocytes Relative: 14 %
Lymphs Abs: 1.5 10*3/uL (ref 0.7–4.0)
MCH: 28.4 pg (ref 26.0–34.0)
MCHC: 30 g/dL (ref 30.0–36.0)
MCV: 94.7 fL (ref 80.0–100.0)
Monocytes Absolute: 0.9 10*3/uL (ref 0.1–1.0)
Monocytes Relative: 8 %
Neutro Abs: 7.7 10*3/uL (ref 1.7–7.7)
Neutrophils Relative %: 75 %
Platelets: 608 10*3/uL — ABNORMAL HIGH (ref 150–400)
RBC: 3.2 MIL/uL — ABNORMAL LOW (ref 4.22–5.81)
RDW: 17.2 % — ABNORMAL HIGH (ref 11.5–15.5)
WBC: 10.3 10*3/uL (ref 4.0–10.5)
nRBC: 0 % (ref 0.0–0.2)

## 2019-09-23 LAB — COMPREHENSIVE METABOLIC PANEL
ALT: 83 U/L — ABNORMAL HIGH (ref 0–44)
AST: 100 U/L — ABNORMAL HIGH (ref 15–41)
Albumin: 2.7 g/dL — ABNORMAL LOW (ref 3.5–5.0)
Alkaline Phosphatase: 227 U/L — ABNORMAL HIGH (ref 38–126)
Anion gap: 14 (ref 5–15)
BUN: 9 mg/dL (ref 8–23)
CO2: 20 mmol/L — ABNORMAL LOW (ref 22–32)
Calcium: 8.7 mg/dL — ABNORMAL LOW (ref 8.9–10.3)
Chloride: 103 mmol/L (ref 98–111)
Creatinine, Ser: 1.02 mg/dL (ref 0.61–1.24)
GFR calc Af Amer: >60 mL/min (ref >60)
GFR calc non Af Amer: >60 mL/min (ref >60)
Glucose, Bld: 117 mg/dL — ABNORMAL HIGH (ref 70–99)
Potassium: 3.5 mmol/L (ref 3.5–5.1)
Sodium: 137 mmol/L (ref 135–145)
Total Bilirubin: 0.7 mg/dL (ref 0.3–1.2)
Total Protein: 7.7 g/dL (ref 6.5–8.1)

## 2019-09-23 LAB — C DIFFICILE QUICK SCREEN W PCR REFLEX
C Diff antigen: NEGATIVE
C Diff interpretation: NOT DETECTED
C Diff toxin: NEGATIVE

## 2019-09-23 LAB — GLUCOSE, CAPILLARY: Glucose-Capillary: 96 mg/dL (ref 70–99)

## 2019-09-23 MED ORDER — ZOLPIDEM TARTRATE 5 MG PO TABS
5.0000 mg | ORAL_TABLET | Freq: Once | ORAL | Status: AC
  Administered 2019-09-23: 5 mg via ORAL
  Filled 2019-09-23: qty 1

## 2019-09-23 MED ORDER — SODIUM CHLORIDE 0.9 % IV BOLUS
500.0000 mL | Freq: Once | INTRAVENOUS | Status: AC
  Administered 2019-09-23: 500 mL via INTRAVENOUS

## 2019-09-23 MED ORDER — CALCIUM CARBONATE ANTACID 500 MG PO CHEW
1.0000 | CHEWABLE_TABLET | Freq: Three times a day (TID) | ORAL | Status: DC | PRN
  Filled 2019-09-23: qty 1

## 2019-09-23 MED ORDER — CALCIUM CARBONATE ANTACID 500 MG PO CHEW
1.0000 | CHEWABLE_TABLET | Freq: Three times a day (TID) | ORAL | Status: DC
  Administered 2019-09-23 – 2019-09-24 (×2): 200 mg via ORAL
  Filled 2019-09-23 (×2): qty 1

## 2019-09-23 MED ORDER — ZOLPIDEM TARTRATE 5 MG PO TABS
10.0000 mg | ORAL_TABLET | Freq: Every evening | ORAL | Status: DC | PRN
  Administered 2019-09-23: 10 mg via ORAL
  Filled 2019-09-23: qty 2

## 2019-09-23 MED ORDER — AMIODARONE HCL 200 MG PO TABS: 200.0000 mg | ORAL_TABLET | Freq: Every day | ORAL | Status: DC

## 2019-09-23 MED ORDER — ALUM & MAG HYDROXIDE-SIMETH 200-200-20 MG/5ML PO SUSP
30.0000 mL | ORAL | Status: DC | PRN
  Administered 2019-09-23: 30 mL via ORAL
  Filled 2019-09-23: qty 30

## 2019-09-23 NOTE — Significant Event (Signed)
Rapid Response Event Note  Nursing staff had called me, I was responding to a medical emergency so I was unable to talk to them. I called back at 1541 to ask if they needed anything, CRN updated me on what had happened, BP dropped and HR dropped, perhaps vagal ? CARDS MD came to bedside, gave 1L bolus, patient improved per nurse and they did not need any other RRT interventions/support   Gaetan Spieker R

## 2019-09-23 NOTE — Significant Event (Signed)
Rapid Response Event Note  Overview: Bradycardia   Initial Focused Assessment: I came to see the patient for periods of bradycardia, per nurse, patient had 2 periods of sustained bradycardia (HR in the upper 40s). Currently HR in the 80s SR, patient was in AF earlier this admission and is on AMIO PO which the nurse has held this morning. Upon arrival, patient was in the chair, very emotional and distraught, he appears fatigued and he seems emotionally defeated. He endorses having some burping (while I was there, he started burping and then I saw the transient bradycardia, when he stopped burping, HR was normal in the 80s.) Skin warm and dry, c/o: GI upset and diarrhea coupled with weight loss. Lung sound clear, VSS currently  I spent 45 minutes talking the patient and his wife, we talked about his overall recovery and some of the "setbacks", we talked to faith and God and we ended up praying together. It seems as though that the patient needs more emotional support and he is struggling with coping.   Interventions: - No RRT Interventions   Plan of Care: -- Chaplain consult -- Encourage POs as tolerates -- Perhaps GI Cocktail for Indigestion (already on Protonix)  Event Summary:  Start Time 1015 End Time [place], [place]

## 2019-09-23 NOTE — Progress Notes (Signed)
Progress Note  Patient Name: Jeffrey Tucker Date of Encounter: 09/23/2019  Primary Cardiologist: Ida Rogue, MD   Subjective   Had quite a tough night last night.  He says the previous evening he gasoline pressure helping him sleep and he felt great yesterday.  Unfortunately last night that he was on a different (likely melatonin) and he was not able to sleep he felt very uneasy.  They then gave him one of the what appears to be Ambien and he fell asleep only to wake up a few hours later with a very upset stomach and was not able to make it to the bathroom prior to having explosive diarrhea.  It took quite a long time to get him cleaned up and then back to bed.  He barely went to sleep until roughly 7 in the morning.  The rest the morning he is been very emotional, emotionally labile with crying.  He also indicates having had some indigestion over the last couple days.  Today he is very emotional tired from not sleeping well.  Upset about the loose stools.  Currently being placed on C. difficile protocol.  He had a pretty significant crying spell and afterwards looked at the monitor and noted bradycardia.  Telemetry was reviewed and there were several episodes of sinus rhythm with what appears to be blocked PACs and bigeminy pattern with reduced heart rate in the 40s.  From a symptom standpoint he was relatively asymptomatic, he was noted on the monitor.  No sense of dyspnea or palpitations etc.  Also concerned about a 9 pound weight loss  Inpatient Medications    Scheduled Meds: . [START ON 09/25/2019] amiodarone  200 mg Oral Daily  . aspirin EC  81 mg Oral Daily  . atorvastatin  80 mg Oral q1800  . benzonatate  100 mg Oral TID  . budesonide (PULMICORT) nebulizer solution  0.5 mg Nebulization BID  . colchicine  0.6 mg Oral BID  . ferrous sulfate  325 mg Oral Q breakfast  . ipratropium-albuterol  3 mL Nebulization BID  . levothyroxine  125 mcg Oral Daily  . pantoprazole  40 mg  Oral BID   Continuous Infusions: . cefTRIAXone (ROCEPHIN)  IV 2 g (09/22/19 1416)   PRN Meds: acetaminophen **OR** acetaminophen, ipratropium-albuterol, Melatonin, morphine injection, ondansetron **OR** ondansetron (ZOFRAN) IV, polyethylene glycol, traMADol   Vital Signs    Vitals:   09/23/19 0359 09/23/19 0753 09/23/19 0756 09/23/19 0838  BP:    106/73  Pulse:  83  (!) 48  Resp:  19  16  Temp:    97.7 F (36.5 C)  TempSrc:    Oral  SpO2:  94% 94% 97%  Weight: 64.6 kg     Height:       No intake or output data in the 24 hours ending 09/23/19 1207 Last 3 Weights 09/23/2019 09/22/2019 09/21/2019  Weight (lbs) 142 lb 6.4 oz 153 lb 14.1 oz 152 lb 11.2 oz  Weight (kg) 64.592 kg 69.8 kg 69.264 kg      Telemetry    SR for the most part, but this morning at roughly 07/[birth year]/1949 intermittent runs of what appears to be atrial bigeminy with blocked PACs and rates in the 40s.  Now back in sinus rhythm- Personally Reviewed  ECG    No new EKG- Personally Reviewed  Physical Exam   GEN: No acute distress, but does appear to be somewhat chronically ill.  Nontoxic.  Sitting up in chair.  Does  seem to be somewhat thin and frail..   Neck: No JVD Cardiac: RRR, no murmurs, or gallops.  No obvious rub Respiratory: Clear to auscultation bilaterally. GI: Soft, nontender, non-distended  -increased bowel sounds MS: No edema; No deformity. Neuro:  Nonfocal  Psych: Normal affect; very tearful emotionally labile.  Labs    High Sensitivity Troponin:   Recent Labs  Lab 09/16/19 1336 09/16/19 1519  TROPONINIHS 149* 168*      Chemistry Recent Labs  Lab 09/16/19 1336 09/17/19 0303  09/20/19 0345 09/21/19 0316 09/22/19 0625  NA 136 133*   < > 134* 135 135  K 4.0 4.9   < > 3.6 3.1* 3.8  CL 101 100   < > 99 100 104  CO2 22 21*   < > 22 22 21*  GLUCOSE 162* 160*   < > 102* 72 100*  BUN 14 17   < > 19 16 9   CREATININE 0.86 0.92   < > 0.93 0.93 0.90  CALCIUM 8.7* 8.6*   < > 8.2* 8.2* 8.3*   PROT 7.3 7.3  --   --   --   --   ALBUMIN 2.8* 2.7*  --   --   --   --   AST 26 28  --   --   --   --   ALT 16 21  --   --   --   --   ALKPHOS 151* 157*  --   --   --   --   BILITOT 0.6 0.5  --   --   --   --   GFRNONAA >60 >60   < > >60 >60 >60  GFRAA >60 >60   < > >60 >60 >60  ANIONGAP 13 12   < > 13 13 10    < > = values in this interval not displayed.     Hematology Recent Labs  Lab 09/21/19 0316 09/22/19 0625 09/23/19 0857  WBC 8.9 9.4 10.3  RBC 2.79* 2.99* 3.20*  HGB 8.0* 8.6* 9.1*  HCT 26.0* 27.9* 30.3*  MCV 93.2 93.3 94.7  MCH 28.7 28.8 28.4  MCHC 30.8 30.8 30.0  RDW 15.9* 16.4* 17.2*  PLT 503* 534* 608*    BNP Recent Labs  Lab 09/16/19 2057 09/20/19 0345  BNP 451.3* 604.5*     DDimer No results for input(s): DDIMER in the last 168 hours.   Radiology    No results found.  Cardiac Studies   TTE: 09/17/19  IMPRESSIONS  1. Left ventricular ejection fraction, by visual estimation, is 45 to 50%. The left ventricle has mildly decreased function. There is moderately increased left ventricular hypertrophy. 2. Indeterminate diastolic filling due to E-A fusion pattern of LV diastolic filling. 3. Inferior and inferoseptal hypokinesis 4. Global right ventricle has moderately reduced systolic function.The right ventricular size is small. No increase in right ventricular wall thickness. 5. Hypokinetic right ventricular apex. 6. RV small -apperas to be extrinsically compressed by an echogeneic fluid collection which is anterior and lateral to the RV. 7. Left atrial size was normal. 8. Right atrial size was normal. 9. The tricuspid valve is abnormal. Tricuspid valve regurgitation is mild. 10. Prosthetic aortic graft in the ascending aorta position. 11. Aortic valve regurgitation was not visualized by color flow Doppler. 12. Aortic valve peak gradient measures 13.1 mmHg. 13. Aortic valve mean gradient measures 7.0 mmHg. 14. 27 mm Konect Resilia Aortic  Valve Conduit. 15. The pulmonic valve was grossly normal. Pulmonic  valve regurgitation is not visualized by color flow Doppler. 16. The mitral valve is grossly normal. Trace mitral valve regurgitation. 17. The inferior vena cava is dilated in size with <50% respiratory variability, suggesting right atrial pressure of 15 mmHg. 18. Trivial pericardial effusion is present. 19. No evidence of any gross valvular vegetations.  Patient Profile     63 y.o. male  CAD s/p CABG x 2 (SVG-OM, SVG-PDA 08/27/19), AAA, bicuspid aortic valve with severe valvular regrugitation s/p bentall procedure, pulmonary hypertension, former smoker, asthma, HTN, HLD, hypothyroidism, and BPH. He presented to [facility] with CP relieved with nitro x 3.   Assessment & Plan    1. Dressler Syndrome:  Continue colchicine at current dose with plans for least 1 month coverage  If recurrent symptoms, would consider steroids.   2. CAD s/p CABG: maintained on ASA, and statin. BB stopped 2/2 to "lung spasms".  -> Currently on amiodarone which does provide some beta-blocker effect.  3. Post op anemia: Stable hemoglobin.  (Status post Feraheme)  4. PAF: Had A. fib on October 25.  Now back in sinus rhythm after couple salvos of recurrence.  Now maintaining sinus rhythm on amiodarone but had an episode this morning of atrial bigeminy with blocked PACs.  I think this is probably related to his emotional state and probably some vagal tone, however cannot be sure.  Plan to reduce to 200 mg amiodarone and hold today.  Would not attempt beta-blocker or other AV nodal agent at this time  Would continue to amiodarone at 200 mg daily for the first month.  Then stop  For now, as long as he maintains sinus rhythm, we will plan to hold off on DOAC based on anemia.  If monitor does show signs of recurrent A. fib, may need to consider DOAC in the future.    Will plan for 30-day outpatient monitor on discharge.  This will be to monitor for  possible PAF also for the unusual rhythm this morning.  .    With his diarrhea, but currently being placed on CIWA protocol.  He is on ceftriaxone.  Not unreasonable.  However cannot exclude the possibility of any relationship with either colchicine or amiodarone. Plan: Reduce amiodarone dose. We will probably try to avoid adjusting colchicine, hopefully this episode is resolved and he will be feeling better.  I do start worry about his nutritional status.  At this point, probably may not be ready for discharge today until the answer about his loose stools is resolved, but I still think from a cardiac standpoint and overall general health standpoint he needs to be discharged soon.  We will follow along telemetry to see how he is doing, but I suspect he is otherwise stable from cardiac standpoint.  For questions or updates, please contact [place] Please consult [email or web address] for contact info under        Signed, Glenetta Hew, MD  09/23/2019, 12:07 PM     ATTENDING

## 2019-09-23 NOTE — Progress Notes (Signed)
PROGRESS NOTE  Jeffrey Tucker TKW:[id number] DOB: 10/24/1956 DOA: 09/16/2019 PCP: Venia Carbon, MD  Brief History   63 year old male with history of CAD/CABG x2, AAA repair and AVR (Bentall procedure) on 08/27/2019, pulmonary HTN, former smoker, asthma, HTN, HLD, hypothyroidism and BPH presenting with chest discomfort, generalized weakness, episodic "lung spasm"/orthopnea?, edema, fever to 102 and dry cough.   In ED, HR 110s.  RR in 52s.  BP normal.  Saturation upper 90s on room air.  WBC 13.  Hgb 8.1 (baseline).  Platelets 630.  Mild troponin elevation to 149> 168.  BNP 450.  CRP 16.4.  ESR 89.  TSH 10.  EKG without acute ischemic finding.  CXR with small left pleural effusion and associated LLL opacity.  CTA chest moderate cardiomegaly with evidence of right heart dysfunction, slight interval increase in the amount of pericardial effusion, 5 x 5 cm ill-defined collection anterior inferior to the heart and posterior to sternotomy suggestive for postsurgical seroma, improved aeration of lungs with decrease in consolidative changes of LLE compared to prior CT and no PE.  Cardiology and CTS consulted and patient was admitted for post cardiotomy syndrome.  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  He then went into atrial fibrillation with RVR, new onset on the morning of 09/20/2019.  Cardiology started him on amiodarone drip which was switched to oral amiodarone today.  On the morning of 09/23/2019 the patient had two episodes of bradycardia with HR in the upper 40's for which a rapid response was called. The patient appeared to have been asymptomatic. He does, however, complain of being very fatigued. He states that he had 4 loose BM's yesterday and that he had one loose BM at 0300 this morning for which he was unable to make it to the toilet. He is also nauseated and complains of abdominal cramping. Only one BM is recorded for the last 24 hours.  Consultants  .  Cardiology . Cardiothoracic Surgery  Procedures  .   Antibiotics   Anti-infectives (From admission, onward)   Start     Dose/Rate Route Frequency Ordered Stop   09/18/19 1000  azithromycin (ZITHROMAX) tablet 250 mg     250 mg Oral Daily 09/17/19 1208 09/21/19 0854   09/17/19 1300  cefTRIAXone (ROCEPHIN) 2 g in sodium chloride 0.9 % 100 mL IVPB     2 g 200 mL/hr over 30 Minutes Intravenous Every 24 hours 09/17/19 1208 09/24/19 1259   09/17/19 1215  azithromycin (ZITHROMAX) tablet 500 mg     500 mg Oral Daily 09/17/19 1208 09/17/19 1319    .  Subjective  The patient is very fatigued and seems very weak. He is emotionally labile. He is with wife at bedside. She is very attentive.  Objective   Vitals:  Vitals:   09/23/19 0756 09/23/19 0838  BP:  106/73  Pulse:  (!) 48  Resp:  16  Temp:  97.7 F (36.5 C)  SpO2: 94% 97%   Exam:  Constitutional:  The patient is awake, alert, and oriented x 3. He is in moderate distress from fatigue and illness. He is very concerned about diarrhea. Respiratory:  . No increased work of breathing. . No wheezes, rales, or rhonchi . No tactile fremitus Cardiovascular:  . Regular rate and rhythm . No murmurs, ectopy, or gallups. . No lateral PMI. No thrills. Abdomen:  . Abdomen is soft, non-tender, non-distended . No hernias, masses, or organomegaly . Hyperactive bowel sounds.  Musculoskeletal:  . No  cyanosis, clubbing, or edema Skin:  . No rashes, lesions, ulcers . palpation of skin: no induration or nodules Neurologic:  . CN 2-12 intact . Sensation all 4 extremities intact Psychiatric:  . Mental status o Mood: depressed, affect labile o Orientation to person, place, time   I have personally reviewed the following:   Today's Data  . CBC, Vitals, I's and O'sd  Scheduled Meds: . [START ON 09/25/2019] amiodarone  200 mg Oral Daily  . aspirin EC  81 mg Oral Daily  . atorvastatin  80 mg Oral q1800  . benzonatate  100 mg Oral TID   . budesonide (PULMICORT) nebulizer solution  0.5 mg Nebulization BID  . colchicine  0.6 mg Oral BID  . ferrous sulfate  325 mg Oral Q breakfast  . ipratropium-albuterol  3 mL Nebulization BID  . levothyroxine  125 mcg Oral Daily  . pantoprazole  40 mg Oral BID   Continuous Infusions: . cefTRIAXone (ROCEPHIN)  IV 2 g (09/22/19 1416)    Principal Problem:   Post cardiotomy syndrome Active Problems:   Hypothyroidism   Mild intermittent asthma   Essential hypertension, benign   S/P aortic valve replacement with bioprosthetic valve   Rapid atrial fibrillation (HCC)   Coronary artery disease of bypass graft of native heart with stable angina pectoris ([place])   Anemia   LOS: 7 days   A & P  Chest discomfort/dyspnea/"lung spasm": unclear etiology of this.  Differential diagnosis include post cardiotomy syndrome/Dressler syndrome, pneumonia, pericarditis, CHF exacerbation, symptomatic anemia, anxiety and asthma.  Patient has had fever, chills, leukocytosis, elevated inflammatory markers and some LLL consolidative changes on CT although improved concerning for PNA.  PCT elevated.  With pericardial effusion, concern about pericarditis as well. He also have orthopnea, edema,  elevated BNP and troponin concerning for CHF.  Echo shows 45 to 50% ejection fraction.  No wall motion abnormality. The patient is being treated with colchicine bid for pericarditis. Steroid may be added if neede as per CTS. Appreciate cardiology and CTS guidance.  CAP: Pt may discontinue antibiotics today as he will have completed treatment. Continue Budesonide, Duonebs  Acute Systolic IOE:VOJJ with EF of 45 to 50%, moderate LVH, moderately reduced RV EF, fluid collection compressing on RV.  Received IV Lasix 40 mg once on 10/22.  Cr bumped up.  Soft blood pressures.  Lasix discontinued.  He is on room air. No sign of tamponade on echocardiogram. Management per cardiology.  CAD/CABG x2, AAA repair and AVR on 08/27/2019: CTA  negative for PE but some pericardial effusion.  No signs of tamponade.  Echocardiogram as above.  Metoprolol discontinued due to "lung spasm". The patient will be continued on Statins and ASA. Beta blockers held due to low blood pressures.  Acute/subacute blood loss anemia: Hgb 8.1>> 7.6>1u>8.5>7.8. Stable at 10.3 today.  Baseline 7-8.  No signs of active bleed.  Anemia panel consistent with iron deficiency.  FOBT negative x2. The patient has received feraheme 510 mg x 1. Aspirin resumed on 09/20/2019.  New onset atrial fibrillation with RVR: Started on the morning of 09/20/2019 and converted back to sinus rhythm soon after started on amiodarone drip by cardiology.  Amiodarone has been switched to oral today.  Remains in sinus rhythm.  Per cardiology, not a candidate for anticoagulation due to anemia.  [place] cardiology help.  AKI: Resolved.  Was likely due to IV Lasix. Monitor electrolytes, creatinine, and volume status.  Diarrhea: Patient states that this has been going on for 4  days. He states that he had 4 loose BM's yesterday and one at 0300. He states that he was unable to make it to the toilet for this one. I believe that this may be antibiotic related. However, the patient's wife is requesting C Diff testing. I will order GI pathogen testing as well as C Diff. Today is the last day of antibiotics. If pathogen testing is negative, I will start imodium.  Mild hyponatremia: Improved. Continue monitoring.  Right lower extremity swelling s/p SVG harvest: Bilateral Doppler negative for DVT.  No calf tenderness. Elevated right lower extremity and TED hose.  Mild intermittent asthma: No wheezing.  Unclear explanation for his "lung spasm" but improving. Breathing treatments as above. Continue Tessalon Perles.  Essential hypertension: Blood pressures actually on the low side without use of antihypertensives.  Continue to monitor.  Hypothyroidism: TSH marginally elevated. Continue home  Synthroid. Recheck TSH in 4 to 6 weeks outpatient.   Thrombocytosis: Likely reactive. Monitor.  Mildly elevated alkaline phosphatase.  Monitor.  I have seen and examined this patient myself. I have spent 38 minutes in his evaluation and care.  DVT prophylaxis: SCD Code Status: Full code Family Communication: Patient's wife at bedside. All questions answered to the best of my ability. Disposition Plan: Evaluated by PT OT.  They recommend CIR.  Pending CIR evaluation.  Shoshanna Mcquitty, DO [facility] Direct contact: see [email or web address]  7PM-7AM contact night coverage as above 09/23/2019, 12:54 PM  LOS: 7 days

## 2019-09-23 NOTE — Progress Notes (Signed)
Patient requesting TUMS, patient states that Maalox is not effective at this time, MD and MD on call pagedx2 through [facility] system, will await call back. Iyanna Drummer, Bettina Gavia RN

## 2019-09-23 NOTE — Progress Notes (Addendum)
Patient sitting in chair c/o chest pain. Patient diaphoretic. BP 62/42 HR 71. BG 96. O2 96. EKG done. Rapid response unavailable. Dr. Ellyn Hack paged at this time. Per Dr. Ellyn Hack,  New orders received.   See flowsheet for additional vitals.   [place], NP and Dr. Ellyn Hack at bedside.   1449 EKG done

## 2019-09-23 NOTE — Progress Notes (Signed)
     Called to see pt - hypotensive - BP 60s/40s with normal HR.  Noted diaphoresis & chest tightness.  Prior to arrival - pt in Trendelenberg; per my phone orderes - 500 ml NS bolus being given. BP now in 0000000 mmHg systolic.    EKG relatively normal.  As BP improved - taken out of Trendelenberg - chest pressure better.  Important to note - pt has had loose stool/ diarrhea all night & today.    Suspect vagal episodes with drop in BP exacerbated by dehydration in setting of recent diarrhea.  Will complete 1 L bolus.   Pt stable upon my leaving.  CP notably improved.  In NSR.  BPs stable.   Glenetta Hew, MD

## 2019-09-23 NOTE — Progress Notes (Signed)
Patient ambulated in hallway with wife and rolling walker. Verma [place], Bettina Gavia RN

## 2019-09-23 NOTE — Progress Notes (Addendum)
Patient heart rate dropped to 49-50 sustaining regular rhythm ,  with some PVcs on monitor around 830 AM, patient slightly tired bp 106/73 patient prior heart rate had been 80-90s on monitor. Will continue to monitor patient. PA/NP on call for cardiology paged through [place], Bettina Gavia RN

## 2019-09-24 ENCOUNTER — Other Ambulatory Visit: Payer: Self-pay | Admitting: Cardiology

## 2019-09-24 DIAGNOSIS — I480 Paroxysmal atrial fibrillation: Secondary | ICD-10-CM

## 2019-09-24 LAB — CBC WITH DIFFERENTIAL/PLATELET
Abs Immature Granulocytes: 0.09 10*3/uL — ABNORMAL HIGH (ref 0.00–0.07)
Basophils Absolute: 0.1 10*3/uL (ref 0.0–0.1)
Basophils Relative: 1 %
Eosinophils Absolute: 0.1 10*3/uL (ref 0.0–0.5)
Eosinophils Relative: 1 %
HCT: 30.3 % — ABNORMAL LOW (ref 39.0–52.0)
Hemoglobin: 9.3 g/dL — ABNORMAL LOW (ref 13.0–17.0)
Immature Granulocytes: 1 %
Lymphocytes Relative: 16 %
Lymphs Abs: 1.1 10*3/uL (ref 0.7–4.0)
MCH: 28.6 pg (ref 26.0–34.0)
MCHC: 30.7 g/dL (ref 30.0–36.0)
MCV: 93.2 fL (ref 80.0–100.0)
Monocytes Absolute: 0.8 10*3/uL (ref 0.1–1.0)
Monocytes Relative: 12 %
Neutro Abs: 4.7 10*3/uL (ref 1.7–7.7)
Neutrophils Relative %: 69 %
Platelets: 526 10*3/uL — ABNORMAL HIGH (ref 150–400)
RBC: 3.25 MIL/uL — ABNORMAL LOW (ref 4.22–5.81)
RDW: 17.6 % — ABNORMAL HIGH (ref 11.5–15.5)
WBC: 6.8 10*3/uL (ref 4.0–10.5)
nRBC: 0 % (ref 0.0–0.2)

## 2019-09-24 LAB — BASIC METABOLIC PANEL
Anion gap: 11 (ref 5–15)
BUN: 9 mg/dL (ref 8–23)
CO2: 19 mmol/L — ABNORMAL LOW (ref 22–32)
Calcium: 8.4 mg/dL — ABNORMAL LOW (ref 8.9–10.3)
Chloride: 106 mmol/L (ref 98–111)
Creatinine, Ser: 1.03 mg/dL (ref 0.61–1.24)
GFR calc Af Amer: >60 mL/min (ref >60)
GFR calc non Af Amer: >60 mL/min (ref >60)
Glucose, Bld: 137 mg/dL — ABNORMAL HIGH (ref 70–99)
Potassium: 3.3 mmol/L — ABNORMAL LOW (ref 3.5–5.1)
Sodium: 136 mmol/L (ref 135–145)

## 2019-09-24 LAB — MAGNESIUM: Magnesium: 1.9 mg/dL (ref 1.7–2.4)

## 2019-09-24 MED ORDER — ASPIRIN 81 MG PO TBEC: 81.0000 mg | DELAYED_RELEASE_TABLET | Freq: Every day | ORAL | 0 refills | Status: DC

## 2019-09-24 MED ORDER — ATORVASTATIN CALCIUM 80 MG PO TABS: 80.0000 mg | ORAL_TABLET | Freq: Every day | ORAL | 0 refills | Status: DC

## 2019-09-24 MED ORDER — COLCHICINE 0.6 MG PO TABS: 0.6000 mg | ORAL_TABLET | Freq: Two times a day (BID) | ORAL | 0 refills | Status: DC

## 2019-09-24 MED ORDER — AMIODARONE HCL 200 MG PO TABS
200.0000 mg | ORAL_TABLET | Freq: Every day | ORAL | Status: DC
  Administered 2019-09-24: 200 mg via ORAL
  Filled 2019-09-24: qty 1

## 2019-09-24 MED ORDER — PANTOPRAZOLE SODIUM 40 MG PO TBEC: 40.0000 mg | DELAYED_RELEASE_TABLET | Freq: Two times a day (BID) | ORAL | 0 refills | Status: DC

## 2019-09-24 MED ORDER — AMIODARONE HCL 200 MG PO TABS: 200.0000 mg | ORAL_TABLET | Freq: Every day | ORAL | 0 refills | Status: DC

## 2019-09-24 NOTE — Discharge Summary (Signed)
Physician Discharge Summary  Jeffrey Tucker JSH:[id number] DOB: 1956/08/13 DOA: 09/16/2019  PCP: Venia Carbon, MD  Admit date: 09/16/2019 Discharge date: 09/24/2019  Recommendations for Outpatient Follow-up:  Home health PT/OT Follow up with PCP in 7-10 days Follow up with Dr. Rockey Situ as directed. Cardiac monitor to be arranged for by cardiology.  Follow-up Information    Minna Merritts, MD Follow up on 10/13/2019.   Specialty: Cardiology Why: at 9:20am for your follow up appt.  Contact information: [address] [place] [address] [phone]          Discharge Diagnoses: Principal diagnosis is #1 1. 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 Community acquired pneumonia 5. Diarrhea 6. Acute systolic CHF 7. S/P CABG, AAA repair, Aortic vavle repair on 08/27/2019. 8. Acute sub/acute blood loss anemia 9. New onset atrial fibrillation 10. AKI 11. Hyponatremia 12. Hypertension' 13. Asthma  Discharge Condition: Fair  Disposition: Home with PT/OT  Diet recommendation: Heart healthy  Filed Weights   09/22/19 0428 09/23/19 0359 09/24/19 0500  Weight: 69.8 kg 64.6 kg 70.2 kg    History of present illness: Jeffrey Tucker is a 63 y.o. male with medical history significant of coronary artery disease status post CABG x2, bicuspid aortic valve with severe valvular regurgitation status post Bentall procedure, pulmonary hypertension, former smoker, asthma, hypertension, hyperlipidemia, hypothyroidism, BPH presents to emergency department due to chest discomfort, weakness, shortness of breath, leg swelling and a dry cough.    Patient had dental procedure with bio prosthetic aortic valve on 08/27/2019.  Patient reports since her discharge from the hospital he is not doing well.  Has chest discomfort, exertional shortness of breath, leg swelling, dry cough and chills.  He took sublingual nitro with some  improvement.  Reports leg swelling is associated with color change in his both lower extremities.  No headache, blurry vision, recent COVID-19 exposure, has nausea however denies vomiting, diarrhea, epigastric pain, numbness weakness tingling sensation.  Wife at bedside reports patient has stomach spasm which causes cough.  Cough is dry, not associated with sore throat, runny nose, chest congestion, wheezing.  He has history of asthma and has been using albuterol and Advair however unable to inhale it completely due to chest pain.  ED Course: Upon arrival: Patient was tachycardic, troponin elevated at 149.  EKG no acute findings.  CT angiogram negative for pulmonary embolism but with increased pericardial infusion.  EDP consulted cardiology and cardiothoracic surgery for further evaluation and management.  Hospital Course:  In ED, HR 110s. RR in 60s. BP normal. Saturation upper 90s on room air. WBC 13. Hgb 8.1 (baseline). Platelets 630. Mild troponin elevation to 149>168. BNP 450. CRP 16.4. ESR 89. TSH 10. EKG without acute ischemic finding. CXR with small left pleural effusion and associated LLL opacity. CTA chest moderate cardiomegaly with evidence of right heart dysfunction, slight interval increase in the amount of pericardial effusion, 5 x 5 cm ill-defined collection anterior inferior to the heart and posterior to sternotomy suggestive for postsurgical seroma, improved aeration of lungs with decrease in consolidative changes of LLE compared to prior CT and no PE. Cardiology and CTS consulted and patient was admitted for post cardiotomy syndrome. 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 He then went into atrial fibrillation with RVR, new onset on the morning of 09/20/2019. Cardiology started him on amiodarone drip which was switched to oral amiodarone today.  On the morning of 09/23/2019 the  patient had two episodes of bradycardia with HR in the  upper 40's for which a rapid response was called. The patient appeared to have been asymptomatic. He does, however, complain of being very fatigued. He states that he had 4 loose BM's yesterday and that he had one loose BM at 0300 this morning for which he was unable to make it to the toilet. He is also nauseated and complains of abdominal cramping. Only one BM is recorded for the last 24 hours.The patient was tested for C Diff at the patient's wife's request. It was negative. In the last 24 hours the patient's diarrhea has slowed down, and he is feeling better. I have discussed the patient with Dr. Ellyn Hack. He is cleared for discharge to home with home health PT/OT. He will have a cardiac monitor arranged for him by cardiology to use for the next 30 days.  Today's assessment: S: The patient is sitting up at bedside. No new complaints.  O: Vitals:  Vitals:   09/24/19 0640 09/24/19 0832  BP: 120/89   Pulse: 84   Resp: (!) 25   Temp: 97.7 F (36.5 C)   SpO2: 96% 96%   Exam:  Constitutional:   The patient is awake, alert, and oriented x 3. No acute distress. Respiratory:   No increased work of breathing.  No wheezes, rales, or rhonchi  No tactile fremitus Cardiovascular:   Regular rate and rhythm  No murmurs, ectopy, or gallups.  No lateral PMI. No thrills. Abdomen:   Abdomen is soft, non-tender, non-distended  No hernias, masses, or organomegaly  Normoactive bowel sounds.  Musculoskeletal:   No cyanosis, clubbing, or edema Skin:   No rashes, lesions, ulcers  palpation of skin: no induration or nodules Neurologic:   CN 2-12 intact  Sensation all 4 extremities intact Psychiatric:   Mental status o Mood, affect appropriate o Orientation to person, place, time   judgment and insight appear intact  Discharge Instructions  Discharge Instructions    (HEART FAILURE PATIENTS) Call MD:  Anytime you have any of the following symptoms: 1) 3 pound weight gain in 24  hours or 5 pounds in 1 week 2) shortness of breath, with or without a dry hacking cough 3) swelling in the hands, feet or stomach 4) if you have to sleep on extra pillows at night in order to breathe.   Complete by: As directed    Activity as tolerated - No restrictions   Complete by: As directed    Call MD for:  difficulty breathing, headache or visual disturbances   Complete by: As directed    Call MD for:  persistant dizziness or light-headedness   Complete by: As directed    Call MD for:  severe uncontrolled pain   Complete by: As directed    Diet - low sodium heart healthy   Complete by: As directed    Discharge instructions   Complete by: As directed    Home health PT/OT Follow up with PCP in 7-10 days Follow up with Dr. Rockey Situ as directed. To be set up with 30 day monitor by cardiology.   Increase activity slowly   Complete by: As directed      Allergies as of 09/24/2019      Reactions   Tetracycline Hcl Other (See Comments)   Joint pain (and stiffened them, also)   Amlodipine Swelling   Lower extremities and body became swollen; can tolerate 5 mg doses, however      Medication List  STOP taking these medications   ketoconazole 2 % shampoo Commonly known as: NIZORAL   Mucinex 600 MG 12 hr tablet Generic drug: guaiFENesin   traMADol 50 MG tablet Commonly known as: ULTRAM     TAKE these medications   acetaminophen 325 MG tablet Commonly known as: TYLENOL Take 2 tablets (650 mg total) by mouth every 6 (six) hours as needed for mild pain.   albuterol 108 (90 Base) MCG/ACT inhaler Commonly known as: VENTOLIN HFA INHALE 2 PUFFS BY MOUTH THREE TIMES DAILY AS NEEDED FOR ASTHMA FLARE What changed: See the new instructions.   amiodarone 200 MG tablet Commonly known as: PACERONE Take 1 tablet (200 mg total) by mouth daily. Start taking on: September 25, 2019   aspirin 81 MG EC tablet Take 1 tablet (81 mg total) by mouth daily. Start taking on: September 25, 2019 What changed:   medication strength  how much to take   atorvastatin 80 MG tablet Commonly known as: LIPITOR Take 1 tablet (80 mg total) by mouth daily at 6 PM. What changed:   medication strength  how much to take   benzonatate 100 MG capsule Commonly known as: Tessalon Perles Take 1 capsule (100 mg total) by mouth 3 (three) times daily.   cetirizine 10 MG tablet Commonly known as: ZYRTEC Take 10 mg by mouth daily.   colchicine 0.6 MG tablet Take 1 tablet (0.6 mg total) by mouth 2 (two) times daily.   ferrous sulfate 325 (65 FE) MG tablet Take 1 tablet (325 mg total) by mouth daily with breakfast. For one month then stop. If develop constipation, may stop sooner   Fluticasone-Salmeterol 100-50 MCG/DOSE Aepb Commonly known as: ADVAIR Inhale 1 puff into the lungs 2 (two) times daily.   Glucosamine 500 MG Caps Take 500 mg by mouth daily.   levothyroxine 125 MCG tablet Commonly known as: SYNTHROID Take 1 tablet by mouth once daily What changed: when to take this   MiraLax 17 g packet Generic drug: polyethylene glycol Take 17 g by mouth daily as needed.   pantoprazole 40 MG tablet Commonly known as: PROTONIX Take 1 tablet (40 mg total) by mouth 2 (two) times daily.      Allergies  Allergen Reactions   Tetracycline Hcl Other (See Comments)    Joint pain (and stiffened them, also)   Amlodipine Swelling    Lower extremities and body became swollen; can tolerate 5 mg doses, however    The results of significant diagnostics from this hospitalization (including imaging, microbiology, ancillary and laboratory) are listed below for reference.    Significant Diagnostic Studies: Dg Chest 1 View  Result Date: 09/02/2019 CLINICAL DATA:  Post left thoracentesis EXAM: CHEST  1 VIEW COMPARISON:  09/02/2019 FINDINGS: Decreased left pleural effusion following thoracentesis. No pneumothorax Bilateral pleural effusions with bibasilar atelectasis. Negative for edema.  Aortic valve replacement. IMPRESSION: No pneumothorax post left thoracentesis Bilateral pleural effusions and bibasilar atelectasis. Electronically Signed   By: Franchot Gallo M.D.   On: 09/02/2019 12:49   Dg Chest 2 View  Result Date: 09/10/2019 CLINICAL DATA:  Aortic valve replacement, chest pain, dyspnea EXAM: CHEST - 2 VIEW COMPARISON:  09/02/2019 chest radiograph. FINDINGS: Intact sternotomy wires. Aortic valve prosthesis in place. Surgical clips overlie the upper right chest. Stable cardiomediastinal silhouette with mild cardiomegaly. No pneumothorax. Small right pleural effusion is decreased. Small left pleural effusion is slightly increased. No overt pulmonary edema. Bibasilar atelectasis is improved. IMPRESSION: 1. Stable mild cardiomegaly without pulmonary edema.  2. Improved bibasilar atelectasis. 3. Decreased small right pleural effusion. Slightly increased small left pleural effusion. Electronically Signed   By: Ilona Sorrel M.D.   On: 09/10/2019 16:46   Dg Chest 2 View  Result Date: 09/02/2019 CLINICAL DATA:  Open heart surgery EXAM: CHEST - 2 VIEW COMPARISON:  08/31/2019 FINDINGS: Bibasilar atelectasis and small bilateral pleural effusions left greater than right unchanged from the prior study. Negative for edema. Postop aortic valve replacement. Negative for pneumothorax. IMPRESSION: Bibasilar atelectasis and pleural effusions left greater than right. No change from the prior study. Electronically Signed   By: Franchot Gallo M.D.   On: 09/02/2019 09:17   Dg Chest 2 View  Result Date: 08/31/2019 CLINICAL DATA:  Status post coronary bypass grafting EXAM: CHEST - 2 VIEW COMPARISON:  08/30/2019 FINDINGS: Cardiac shadow is stable but enlarged. Postsurgical changes are again seen. Aortic calcifications are again noted and stable. Stable left pleural effusion is noted. Underlying atelectasis is likely present. Similar changes but to a much lesser degree are noted on the right similar to that  seen on the prior exam. No pneumothorax is noted. No bony abnormality is seen. Right jugular sheath has been removed in the interval. IMPRESSION: Bilateral basilar atelectasis and effusions left significantly greater than right but stable from the previous exam. Electronically Signed   By: Inez Catalina M.D.   On: 08/31/2019 07:43   Ct Chest W Contrast  Result Date: 09/02/2019 CLINICAL DATA:  Chest pain, soreness in the left shoulder and back status post thoracentesis EXAM: CT CHEST WITH CONTRAST TECHNIQUE: Multidetector CT imaging of the chest was performed during intravenous contrast administration. CONTRAST:  67m OMNIPAQUE IOHEXOL 300 MG/ML  SOLN COMPARISON:  07/24/2019 FINDINGS: Cardiovascular: Stable cardiomegaly. Interval CABG. Pericardial thickening consistent with postsurgical changes. Complex fluid surrounding the aortic root and proximal arch likely reflecting small amount hemorrhage which is likely postsurgical. No extraluminal contrast to suggest active bleeding. Three bypass grafts are identified and are patent. Aortic valve replacement. Thoracic aortic atherosclerosis. Mediastinum/Nodes: No lymphadenopathy. Trachea is normal. Esophagus is normal. Lungs/Pleura: Small bilateral pleural effusions. Bibasilar airspace disease consistent with atelectasis. No pneumothorax. Upper Abdomen: No acute abnormality. Musculoskeletal: No acute osseous abnormality. No aggressive osseous lesion. Median sternotomy. IMPRESSION: 1. Small bilateral pleural effusions. Bibasilar airspace disease consistent with atelectasis. 2. Stable cardiomegaly. Interval CABG. Pericardial thickening consistent with postsurgical changes. Complex fluid surrounding the aortic root and proximal arch likely reflecting small amount hemorrhage which is likely postsurgical. No extraluminal contrast to suggest active bleeding. Three bypass grafts are identified and are patent. 3. Aortic valve replacement. Electronically Signed   By: HKathreen Devoid   On: 09/02/2019 19:47   Ct Angio Chest Pe W And/or Wo Contrast  Result Date: 09/16/2019 CLINICAL DATA:  63year old male with concern for pulmonary embolism. EXAM: CT ANGIOGRAPHY CHEST WITH CONTRAST TECHNIQUE: Multidetector CT imaging of the chest was performed using the standard protocol during bolus administration of intravenous contrast. Multiplanar CT image reconstructions and MIPs were obtained to evaluate the vascular anatomy. CONTRAST:  1074mOMNIPAQUE IOHEXOL 350 MG/ML SOLN COMPARISON:  Chest CT dated 09/02/2019 FINDINGS: Cardiovascular: Moderate cardiomegaly similar to prior CT. Aortic valve repair/replacement. There is retrograde flow of contrast from the right atrium into the IVC suggestive of a degree of right heart dysfunction. There has been slight interval increase in the amount of pericardial effusion and edema compared to prior CT. A 5.2 x 5.0 cm ill-defined collection anterior and inferior to the heart and posterior to sternotomy (series  5, image 74), likely postsurgical seroma. Attention on follow-up imaging recommended. There is mild atherosclerotic calcification of the aortic arch. No dissection. Similar appearance of low attenuating fluid surrounding the aortic root. No extravasation of contrast. Evaluation of the pulmonary arteries is limited due to respiratory motion artifact and streak artifact caused by patient's arms. There is a small nonocclusive intraluminal hypodensity in the left upper lobe branch (series 6, image 81, sagittal series 11 image 105, and coronal series 10, image 82). This most likely represent an area of scarring and appears to have been present on the prior CT of 09/02/2019 (series 6, image 51). No definite acute large or central pulmonary artery embolus identified. Mediastinum/Nodes: No hilar adenopathy. The esophagus is grossly unremarkable. Postsurgical changes and edema in the anterior mediastinum. Top-normal mediastinal lymph nodes measure up to 11 mm in short  axis in the prevascular space, likely reactive. Lungs/Pleura: There is a small left pleural effusion with partial consolidative changes of the left lower lobe which may represent atelectasis versus infiltrate. Overall improved aeration of the left lower lobe as well as left upper lobe compared to the prior CT. Decreased in the right lower lobe consolidative change since the prior CT with improved aeration of the right lung. There is a trace right pleural effusion significantly decreased in size since the prior CT. Upper Abdomen: There is a 6 mm nonobstructing left renal interpolar calculus. Cholecystectomy. There is a 5.5 cm right renal cyst. Musculoskeletal: Median sternotomy wires. No acute osseous pathology. Review of the MIP images confirms the above findings. IMPRESSION: 1. No definite CT evidence of central pulmonary artery embolus. Small nonocclusive intraluminal hypodensity in the left upper lobe branch most likely represents an area of scarring. 2. Moderate cardiomegaly with evidence of right heart dysfunction. 3. Slight interval increase in the amount of pericardial effusion and edema compared to the prior CT. 4. Overall improved aeration of the lungs with decrease in the consolidative changes of the lower lobes compared to the prior CT. Significant interval decrease in the size of the right pleural effusion. 5. Aortic Atherosclerosis (ICD10-I70.0). Electronically Signed   By: Anner Crete M.D.   On: 09/16/2019 14:39   Dg Chest Portable 1 View  Result Date: 09/16/2019 CLINICAL DATA:  Status post aortic valve replacement on 10/01 number shortness of breath with chest pressure following breakfast EXAM: PORTABLE CHEST 1 VIEW COMPARISON:  09/10/2019 FINDINGS: No frank interstitial edema. Small left pleural effusion. Associated left lower lobe opacity, likely atelectasis. Possible trace right pleural effusion. No pneumothorax. Cardiomegaly.  Prosthetic aortic valve. Surgical clips along the right  lateral chest wall/axilla. Median sternotomy. IMPRESSION: Small left pleural effusion. Associated left lower lobe opacity, likely atelectasis. Electronically Signed   By: Julian Hy M.D.   On: 09/16/2019 14:16   Dg Chest Port 1 View  Result Date: 08/30/2019 CLINICAL DATA:  Chest tube in place.  Bentall procedure 08/27/2019. EXAM: PORTABLE CHEST 1 VIEW COMPARISON:  08/29/2019 FINDINGS: Patient is rotated to the right. Right IJ central venous sheath unchanged with tip over the SVC. Lungs are hypoinflated with stable left base opacification likely small to moderate effusion with associated basilar atelectasis. Slight worsening small right effusion with associated basilar atelectasis. No pneumothorax. Stable cardiomegaly. Remainder of the exam is unchanged. IMPRESSION: Stable moderate left effusion with associated basilar atelectasis. Slight worsening small right effusion with associated right basilar atelectasis. Stable cardiomegaly. Right IJ central venous sheath unchanged. Electronically Signed   By: Marin Olp M.D.   On: 08/30/2019 08:49  Dg Chest Port 1 View  Result Date: 08/29/2019 CLINICAL DATA:  Chest tube placement. EXAM: PORTABLE CHEST 1 VIEW COMPARISON:  08/28/2019 FINDINGS: Right jugular central venous sheath. Removal of the Swan-Ganz catheter. Stable left basilar pleuroparenchymal disease. Mild bilateral interstitial thickening. Stable enlarged cardiac silhouette. Prior median sternotomy and aortic valve repair. No acute osseous abnormality. IMPRESSION: 1. Interval removal of the Swan-Ganz catheter. Right jugular central venous sheath in satisfactory position. 2. Persistent left basilar pleuroparenchymal disease likely reflecting a small pleural effusion and atelectasis. Electronically Signed   By: Kathreen Devoid   On: 08/29/2019 07:11   Dg Chest Port 1 View  Result Date: 08/28/2019 CLINICAL DATA:  Status post aortic valve surgery EXAM: PORTABLE CHEST 1 VIEW COMPARISON:  08/27/2019  FINDINGS: Cardiac shadow is enlarged but stable. Postsurgical changes are again seen. Swan-Ganz catheter is noted in satisfactory position. Two central mediastinal drains are noted and stable. Endotracheal tube and nasogastric catheter have been removed in the interval. The lungs are well aerated with mild left basilar atelectasis slightly increased in the interval from the prior exam. No pneumothorax is noted. IMPRESSION: Increasing left basilar atelectasis. Tubes and lines as described. Electronically Signed   By: Inez Catalina M.D.   On: 08/28/2019 07:10   Dg Chest Port 1 View  Result Date: 08/27/2019 CLINICAL DATA:  Status post aortic valve replacement EXAM: PORTABLE CHEST 1 VIEW COMPARISON:  Radiograph 08/25/2019 FINDINGS: Endotracheal tube 3.2 cm from carina. NG tube with tip at the GE junction. Swan-Ganz catheter tip in main pulmonary artery. Mediastinal drains noted. Stable cardiac silhouette. Small LEFT effusion and basilar atelectasis. No pulmonary edema. No pneumothorax. IMPRESSION: 1. Support apparatus appears in good position. 2. Small LEFT effusion and atelectasis. 3. No pneumothorax or pulmonary edema. Electronically Signed   By: Suzy Bouchard M.D.   On: 08/27/2019 17:23   Dg Chest Port 1v Same Day  Result Date: 09/02/2019 CLINICAL DATA:  Chest pain and difficulty breathing, history of recent thoracentesis EXAM: PORTABLE CHEST 1 VIEW COMPARISON:  09/02/2019 FINDINGS: Cardiac shadow remains enlarged. Postsurgical changes are noted. Stable right-sided pleural effusion is seen. No pneumothorax is noted. Some slight increased density in the left base is noted which may be related to some re-expansion edema. No bony abnormality is seen. IMPRESSION: Basilar density on the left which may be related to re-expansion edema. No sizable effusion or pneumothorax is noted on the left. Stable right pleural effusion. Electronically Signed   By: Inez Catalina M.D.   On: 09/02/2019 15:56   Vas [place] Lower  Extremity Venous (dvt) (only [facility])  Result Date: 09/17/2019  Lower Venous Study Indications: Pain, Swelling, and Recent CABG and aortic root replacement 08/27/19.  Risk Factors: Right GSV harvest. Limitations: Pants on, unable to remove pants secondary to chest pain. Comparison Study: No prior study Performing Technologist: Sharion Dove RVS  Examination Guidelines: A complete evaluation includes B-mode imaging, spectral Doppler, color Doppler, and power Doppler as needed of all accessible portions of each vessel. Bilateral testing is considered an integral part of a complete examination. Limited examinations for reoccurring indications may be performed as noted.  +---------+---------------+---------+-----------+----------+--------------+  RIGHT     Compressibility Phasicity Spontaneity Properties Thrombus Aging  +---------+---------------+---------+-----------+----------+--------------+  CFV  Not visualized  +---------+---------------+---------+-----------+----------+--------------+  SFJ                                                        Not visualized  +---------+---------------+---------+-----------+----------+--------------+  FV Prox   Full                                             pulsatile       +---------+---------------+---------+-----------+----------+--------------+  FV Mid    Full                                                             +---------+---------------+---------+-----------+----------+--------------+  FV Distal Full                                                             +---------+---------------+---------+-----------+----------+--------------+  PFV       Full                                                             +---------+---------------+---------+-----------+----------+--------------+  POP       Full                                             pulsatile        +---------+---------------+---------+-----------+----------+--------------+  PTV       Full                                                             +---------+---------------+---------+-----------+----------+--------------+  PERO      Full                                                             +---------+---------------+---------+-----------+----------+--------------+   Left Technical Findings: Left leg not evaluated.   Summary: Right: There is no evidence of deep vein thrombosis in the lower extremity. However, portions of this examination were limited- see technologist comments above.  *See table(s) above for measurements and observations. Electronically signed by Servando Snare MD on 09/17/2019 at 2:17:07 PM.    Final    Vas [place] Lower Extremity Venous (dvt)  Result Date: 09/17/2019  Lower Venous Study Indications: Swelling, and Edema.  Comparison Study: 09/16/19- previous study Performing Technologist: Abram Sander RVS  Examination Guidelines: A complete evaluation includes B-mode imaging, spectral Doppler, color Doppler, and power Doppler as needed of all accessible portions of each vessel. Bilateral testing is considered an integral part of a complete examination. Limited examinations for reoccurring indications may be performed as noted.  +---------+---------------+---------+-----------+----------+--------------+  LEFT      Compressibility Phasicity Spontaneity Properties Thrombus Aging  +---------+---------------+---------+-----------+----------+--------------+  CFV       Full            Yes       Yes                                    +---------+---------------+---------+-----------+----------+--------------+  SFJ       Full                                                             +---------+---------------+---------+-----------+----------+--------------+  FV Prox   Full                                                              +---------+---------------+---------+-----------+----------+--------------+  FV Mid    Full                                                             +---------+---------------+---------+-----------+----------+--------------+  FV Distal Full                                                             +---------+---------------+---------+-----------+----------+--------------+  PFV       Full                                                             +---------+---------------+---------+-----------+----------+--------------+  POP       Full            Yes       Yes                                    +---------+---------------+---------+-----------+----------+--------------+  PTV       Full                                                             +---------+---------------+---------+-----------+----------+--------------+  PERO      Full                                                             +---------+---------------+---------+-----------+----------+--------------+     Summary: Left: There is no evidence of deep vein thrombosis in the lower extremity. No cystic structure found in the popliteal fossa.  *See table(s) above for measurements and observations. Electronically signed by Deitra Mayo MD on 09/17/2019 at 11:35:06 AM.    Final    Ir Thoracentesis Asp Pleural Space W/img Guide  Result Date: 09/02/2019 INDICATION: Recent coronary artery bypass graft. Left pleural effusion. Request for therapeutic thoracentesis. EXAM: ULTRASOUND GUIDED LEFT THORACENTESIS MEDICATIONS: 1% lidocaine 10 mL COMPLICATIONS: None immediate. PROCEDURE: An ultrasound guided thoracentesis was thoroughly discussed with the patient and questions answered. The benefits, risks, alternatives and complications were also discussed. The patient understands and wishes to proceed with the procedure. Written consent was obtained. Ultrasound was performed to localize and mark an adequate pocket of fluid in the left chest. The area  was then prepped and draped in the normal sterile fashion. 1% Lidocaine was used for local anesthesia. Under ultrasound guidance a 6 Fr Safe-T-Centesis catheter was introduced. Thoracentesis was performed. The catheter was removed and a dressing applied. FINDINGS: A total of approximately 600 mL of dark red fluid was removed. IMPRESSION: Successful ultrasound guided left thoracentesis yielding 600 mL of pleural fluid. No pneumothorax on post-procedure chest x-ray. Read by: Gareth Eagle, PA-C Electronically Signed   By: Lucrezia Europe M.D.   On: 09/02/2019 13:38    Microbiology: Recent Results (from the past 240 hour(s))  Novel Coronavirus, NAA ([facility])     Status: None   Collection Time: 09/15/19 10:50 AM   Specimen: Oropharyngeal(OP) collection in vial transport medium   OROPHARYNGEA  TESTING  Result Value Ref Range Status   SARS-CoV-2, NAA Not Detected Not Detected Final    Comment: This nucleic acid amplification test was developed and its performance characteristics determined by [employer]. Nucleic acid amplification tests include PCR and TMA. This test has not been FDA cleared or approved. This test has been authorized by FDA under an Emergency Use Authorization (EUA). This test is only authorized for the duration of time the declaration that circumstances exist justifying the authorization of the emergency use of in vitro diagnostic tests for detection of SARS-CoV-2 virus and/or diagnosis of COVID-19 infection under section 564(b)(1) of the Act, 21 U.S.C. 989QJJ-9(E) (1), unless the authorization is terminated or revoked sooner. When diagnostic testing is negative, the possibility of a false negative result should be considered in the context of a patient's recent exposures and the presence of clinical signs and symptoms consistent with COVID-19. An individual without symptoms of COVID-19 and who is not shedding SARS-CoV-2 virus would  expect to have a negative (not detected)  result in this assay.   SARS CORONAVIRUS 2 (TAT 6-24 HRS) Nasopharyngeal Nasopharyngeal Swab     Status: None   Collection Time: 09/16/19  6:00 PM   Specimen: Nasopharyngeal Swab  Result Value Ref Range Status   SARS Coronavirus 2 NEGATIVE NEGATIVE Final    Comment: (NOTE) SARS-CoV-2 target nucleic acids are NOT DETECTED. The SARS-CoV-2 RNA is generally detectable in upper and lower respiratory specimens during the acute phase of infection. Negative results  do not preclude SARS-CoV-2 infection, do not rule out co-infections with other pathogens, and should not be used as the sole basis for treatment or other patient management decisions. Negative results must be combined with clinical observations, patient history, and epidemiological information. The expected result is Negative. Fact Sheet for Patients: SugarRoll.be Fact Sheet for Healthcare Providers: https://www.woods-mathews.com/ This test is not yet approved or cleared by the [place] FDA and  has been authorized for detection and/or diagnosis of SARS-CoV-2 by FDA under an Emergency Use Authorization (EUA). This EUA will remain  in effect (meaning this test can be used) for the duration of the COVID-19 declaration under Section 56 4(b)(1) of the Act, 21 U.S.C. section 360bbb-3(b)(1), unless the authorization is terminated or revoked sooner. Performed at [facility], [place] [address], [place], [place] [address]   Culture, blood (routine x 2)     Status: None   Collection Time: 09/16/19  8:57 PM   Specimen: BLOOD  Result Value Ref Range Status   Specimen Description BLOOD RIGHT ANTECUBITAL  Final   Special Requests   Final    BOTTLES DRAWN AEROBIC AND ANAEROBIC Blood Culture adequate volume   Culture   Final    NO GROWTH 5 DAYS Performed at [facility], [place] [address], [place], [place] [address]    Report Status 09/21/2019 FINAL  Final  Culture, blood (routine x  2)     Status: None   Collection Time: 09/16/19  9:08 PM   Specimen: BLOOD RIGHT HAND  Result Value Ref Range Status   Specimen Description BLOOD RIGHT HAND  Final   Special Requests   Final    BOTTLES DRAWN AEROBIC AND ANAEROBIC Blood Culture results may not be optimal due to an inadequate volume of blood received in culture bottles   Culture   Final    NO GROWTH 5 DAYS Performed at [facility], [place] [address], [place], [place] [address]    Report Status 09/21/2019 FINAL  Final  C difficile quick scan w PCR reflex     Status: None   Collection Time: 09/23/19 10:26 AM   Specimen: Stool  Result Value Ref Range Status   C Diff antigen NEGATIVE NEGATIVE Final   C Diff toxin NEGATIVE NEGATIVE Final   C Diff interpretation No C. difficile detected.  Final    Comment: Performed at [facility], [place] [address], [place], [place] [address]     Labs: Basic Metabolic Panel: Recent Labs  Lab 09/19/19 [phone] 09/20/19 0345 09/21/19 0316 09/22/19 0625 09/23/19 1229 09/24/19 1021  NA 134* 134* 135 135 137 136  K 3.6 3.6 3.1* 3.8 3.5 3.3*  CL 100 99 100 104 103 106  CO2 '23 22 22 '$ 21* 20* 19*  GLUCOSE 118* 102* 72 100* 117* 137*  BUN 24* '19 16 9 9 9  '$ CREATININE 0.91 0.93 0.93 0.90 1.02 1.03  CALCIUM 8.4* 8.2* 8.2* 8.3* 8.7* 8.4*  MG 2.2 2.3 2.1 2.1  --  1.9   Liver Function Tests: Recent Labs  Lab 09/23/19 1229  AST 100*  ALT 83*  ALKPHOS 227*  BILITOT 0.7  PROT 7.7  ALBUMIN 2.7*   No results for input(s): LIPASE, AMYLASE in the last 168 hours. No results for input(s): AMMONIA in the last 168 hours. CBC: Recent Labs  Lab 09/20/19 0345 09/21/19 0316 09/22/19 0625 09/23/19 0857 09/24/19 1021  WBC 7.6 8.9 9.4 10.3 6.8  NEUTROABS  --  5.9 7.2  7.7 4.7  HGB 8.0* 8.0* 8.6* 9.1* 9.3*  HCT 24.6* 26.0* 27.9* 30.3* 30.3*  MCV 89.8 93.2 93.3 94.7 93.2  PLT 562* 503* 534* 608* 526*   Cardiac Enzymes: No results for input(s): CKTOTAL, CKMB, CKMBINDEX, TROPONINI in the  last 168 hours. BNP: BNP (last 3 results) Recent Labs    09/16/19 2057 09/20/19 0345  BNP 451.3* 604.5*    ProBNP (last 3 results) No results for input(s): PROBNP in the last 8760 hours.  CBG: Recent Labs  Lab 09/23/19 1435  GLUCAP 96    Principal Problem:   Post cardiotomy syndrome Active Problems:   Hypothyroidism   Mild intermittent asthma   Essential hypertension, benign   S/P aortic valve replacement with bioprosthetic valve   Rapid atrial fibrillation (HCC)   Coronary artery disease of bypass graft of native heart with stable angina pectoris (HCC)   Anemia   Time coordinating discharge: 38 minutes.  Signed:        Sharnita Bogucki, DO [facility]  09/24/2019, 12:52 PM

## 2019-09-24 NOTE — TOC Transition Note (Addendum)
Transition of Care [facility]) - CM/SW Discharge Note Jeffrey Tucker, BSN Transitions of Care Unit 4E- Tucker Case Manager [phone]  Patient Details  Name: Jeffrey Tucker MRN: [id number] Date of Birth: [birth year]  [facility]) CM/SW Contact:  Jeffrey Patricia, Tucker Phone Number: 09/24/2019, 2:56 PM   Clinical Narrative:    Pt stable for transition home today, orders placed for HHPT/OT. CM spoke with pt and wife regarding transition needs. Pt is uninsured- however confirmed pt has PCP and is familiar with [facility]- usually use Jeffrey Tucker but requesting [facility] this time as meds are less expensive there for d/c medications. Per wife plan to pay out of pocket and are not requesting any assistance at this time. Wife will provide transport home. Pt is requesting RW for home- have sent MD msg for DME order. Discussed HH needs and charity program, wife also asked about private pay options. Also discussed and provided wife info on private duty assistance. Call made to [facility] with [employer] for DME- RW needs- RW to be delivered to room prior to discharge. Call made to [facility] with [facility] for charity HHPT/OT referral- per return call from Jeffrey Tucker pt does not meet guidelines for [facility] program- [facility] is checking to see if services can be provided under private pay.  1600- per Jeffrey Tucker with [facility]- pt has been accepted for private pay [facility] services  Final next level of care: [place] Barriers to Discharge: Barriers Resolved   Patient Goals and CMS Choice Patient states their goals for this hospitalization and ongoing recovery are:: return home and get stronger CMS Medicare.gov Compare Post Acute Care list provided to:: Patient Represenative (must comment)(wife) Choice offered to / list presented to : Spouse  Discharge Placement            Home with [facility]            Discharge Plan and Services   Discharge Planning Services: CM Consult, Medication Assistance Post Acute Care Choice:  Durable Medical Equipment, Home Health          DME Arranged: [place] rolling DME Agency: [employer] Date DME Agency Contacted: 09/24/19 Time DME Agency Contacted: 74 Representative spoke with at DME Agency: Jeffrey Tucker: PT, OT Jeffrey Tucker Agency: [place] ([place]) Date [place]: 09/24/19 Time [place]: 1430 Representative spoke with at [place]: Jeffrey Tucker ([place]) Interventions     Readmission Risk Interventions Readmission Risk Prevention Plan 09/24/2019  Transportation Screening Complete  PCP or Specialist Appt within 5-7 Days Complete  Home Care Screening Complete  Medication Review (Tucker CM) Complete  Some recent data might be hidden

## 2019-09-24 NOTE — Progress Notes (Addendum)
Progress Note  Patient Name: Jeffrey Tucker Date of Encounter: 09/24/2019  Primary Cardiologist: Ida Rogue, MD   Subjective   Feels the best he has since admission. BMs have slowed down. Slept 7 hours.   Inpatient Medications    Scheduled Meds: . amiodarone  200 mg Oral Daily  . aspirin EC  81 mg Oral Daily  . atorvastatin  80 mg Oral q1800  . benzonatate  100 mg Oral TID  . budesonide (PULMICORT) nebulizer solution  0.5 mg Nebulization BID  . calcium carbonate  1 tablet Oral TID  . colchicine  0.6 mg Oral BID  . ferrous sulfate  325 mg Oral Q breakfast  . ipratropium-albuterol  3 mL Nebulization BID  . levothyroxine  125 mcg Oral Daily  . pantoprazole  40 mg Oral BID   Continuous Infusions:  PRN Meds: acetaminophen **OR** acetaminophen, alum & mag hydroxide-simeth, calcium carbonate, ipratropium-albuterol, Melatonin, morphine injection, ondansetron **OR** ondansetron (ZOFRAN) IV, polyethylene glycol, traMADol, zolpidem   Vital Signs    Vitals:   09/23/19 2350 09/24/19 0500 09/24/19 0640 09/24/19 0832  BP: 115/80  120/89   Pulse: 93  84   Resp: (!) 24  (!) 25   Temp: 98.4 F (36.9 C)  97.7 F (36.5 C)   TempSrc: Oral  Oral   SpO2: 96%  96% 96%  Weight:  70.2 kg    Height:        Intake/Output Summary (Last 24 hours) at 09/24/2019 [id number] Last data filed at 09/24/2019 0700 Gross per 24 hour  Intake 1286.65 ml  Output -  Net 1286.65 ml   Last 3 Weights 09/24/2019 09/23/2019 09/22/2019  Weight (lbs) 154 lb 12.8 oz 142 lb 6.4 oz 153 lb 14.1 oz  Weight (kg) 70.217 kg 64.592 kg 69.8 kg      Telemetry    SR - Personally Reviewed  ECG    N/a  - Personally Reviewed  Physical Exam  Pleasant male, sitting up in chair.  GEN: No acute distress.   Neck: No JVD Cardiac: RRR, no murmurs, rubs, or gallops.  Respiratory: Clear to auscultation bilaterally. GI: Soft, nontender, non-distended  MS: No edema; No deformity. Neuro:  Nonfocal  Psych: Normal  affect   Labs    High Sensitivity Troponin:   Recent Labs  Lab 09/16/19 1336 09/16/19 1519  TROPONINIHS 149* 168*      Chemistry Recent Labs  Lab 09/21/19 0316 09/22/19 0625 09/23/19 1229  NA 135 135 137  K 3.1* 3.8 3.5  CL 100 104 103  CO2 22 21* 20*  GLUCOSE 72 100* 117*  BUN 16 9 9   CREATININE 0.93 0.90 1.02  CALCIUM 8.2* 8.3* 8.7*  PROT  --   --  7.7  ALBUMIN  --   --  2.7*  AST  --   --  100*  ALT  --   --  83*  ALKPHOS  --   --  227*  BILITOT  --   --  0.7  GFRNONAA >60 >60 >60  GFRAA >60 >60 >60  ANIONGAP 13 10 14      Hematology Recent Labs  Lab 09/21/19 0316 09/22/19 0625 09/23/19 0857  WBC 8.9 9.4 10.3  RBC 2.79* 2.99* 3.20*  HGB 8.0* 8.6* 9.1*  HCT 26.0* 27.9* 30.3*  MCV 93.2 93.3 94.7  MCH 28.7 28.8 28.4  MCHC 30.8 30.8 30.0  RDW 15.9* 16.4* 17.2*  PLT 503* 534* 608*    BNP Recent Labs  Lab 09/20/19  0345  BNP 604.5*     DDimer No results for input(s): DDIMER in the last 168 hours.   Radiology    No results found.  Cardiac Studies   TTE: 09/17/19  IMPRESSIONS  1. Left ventricular ejection fraction, by visual estimation, is 45 to 50%. The left ventricle has mildly decreased function. There is moderately increased left ventricular hypertrophy. 2. Indeterminate diastolic filling due to E-A fusion pattern of LV diastolic filling. 3. Inferior and inferoseptal hypokinesis 4. Global right ventricle has moderately reduced systolic function.The right ventricular size is small. No increase in right ventricular wall thickness. 5. Hypokinetic right ventricular apex. 6. RV small -apperas to be extrinsically compressed by an echogeneic fluid collection which is anterior and lateral to the RV. 7. Left atrial size was normal. 8. Right atrial size was normal. 9. The tricuspid valve is abnormal. Tricuspid valve regurgitation is mild. 10. Prosthetic aortic graft in the ascending aorta position. 11. Aortic valve regurgitation was not  visualized by color flow Doppler. 12. Aortic valve peak gradient measures 13.1 mmHg. 13. Aortic valve mean gradient measures 7.0 mmHg. 14. 27 mm Konect Resilia Aortic Valve Conduit. 15. The pulmonic valve was grossly normal. Pulmonic valve regurgitation is not visualized by color flow Doppler. 16. The mitral valve is grossly normal. Trace mitral valve regurgitation. 17. The inferior vena cava is dilated in size with <50% respiratory variability, suggesting right atrial pressure of 15 mmHg. 18. Trivial pericardial effusion is present. 19. No evidence of any gross valvular vegetations.   Patient Profile     63 y.o. male with PMH of CAD s/p CABG x 2 (SVG-OM, SVG-PDA 08/27/19), AAA, bicuspid aortic valve with severe valvular regrugitation s/p bentall procedure, pulmonary hypertension, former smoker, asthma, HTN, HLD, hypothyroidism, and BPH. He [facility] to [facility] with CP relieved with nitro x 3.   Assessment & Plan    1. Dressler Syndrome: Continue colchicine at current dose with plans for least 1 month coverage. If recurrent symptoms, would consider steroids.   2. CAD s/p CABG: maintained on ASA, and statin. BB stopped 2/2 to "lung spasms". Currently on amiodarone which does provide some beta-blocker effect.  3. Post op anemia: Stable hemoglobin.  (Status post Feraheme)  4. PAF: Had A. fib on October 25.  Now back in sinus rhythm after couple salvos of recurrence.  Now maintaining sinus rhythm on amiodarone but had an episode the morning of 10/28 of atrial bigeminy with blocked PACs.  Reduced amiodarone to 200 mg daily. Would not attempt beta-blocker or other AV nodal agent at this time. Plan to continue amiodarone at 200 mg daily for the first month.  Then stop -- For now, as long as he maintains sinus rhythm, we will plan to hold off on DOAC based on anemia.  If monitor does show signs of recurrent A. fib, may need to consider DOAC in the future.   -- Will plan for 30-day outpatient  monitor on discharge.  This will be to monitor for possible PAF also for the unusual rhythm this morning.   For questions or updates, please contact [place] Please consult [email or web address] for contact info under   Signed, Reino Bellis, NP  09/24/2019, 9:37 AM    ATTENDING ATTESTATION  I have seen, examined and evaluated the patient this AM along with Reino Bellis, NP-C .  After reviewing all the available data and chart, we discussed the patients laboratory, study & physical findings as well as symptoms in detail. I agree with her findings, examination  as well as impression recommendations as per our discussion.    He looks and feels 100% better than he did yesterday.  Weight is back up.  He is tolerating his breakfast.  He did have 2 loose stools but otherwise is feeling fine.  Breathing fine.  No chest pain.  Agree with discharge plan:  [facility] will sign off.   Medication Recommendations: Would continue colchicine for complete course of 6 weeks, continue amiodarone 200 mg daily for 1 month, continue aspirin and statin.  We will hold off on DOAC until seen by Dr. Rockey Situ Other recommendations (labs, testing, etc): None Follow up as an outpatient:  Will arrange f/u with Dr. Sharman Cheek, M.D., M.S. Interventional Cardiologist   Pager # [phone] Phone # [phone] [address] [place], [place] [address]

## 2019-09-24 NOTE — Progress Notes (Signed)
   09/24/19 0727  Clinical Encounter Type  Visited With Patient  Visit Type Initial  Referral From Nurse  Consult/Referral To Chaplain  Spiritual Encounters  Spiritual Needs Prayer  This chaplain responded to Pt. spiritual care consult for prayer.  The chaplain found the  Pt. pleasantly conversational and sitting up on the edge of the bed when the chaplain introduced herself.  The chaplain listened as the Pt. shared yesterday's struggles and today's brighter beginnings. The Pt. shared his request for prayer before he began his breakfast.  F/U spiritual care is available as needed.

## 2019-09-24 NOTE — Progress Notes (Signed)
Pt complained , stated having " lung spasm". When I assessed Pt, auscultated lung clear, his SPO2 96% on room air, RR 26, HR 88, sinus rhythm on monitor, BP 110/89. He requested to help him sit up, it helped breath better. Denied chest pain. After 10 minutes he lay down with no signs of immediate distress and requested staff do not disturb him until 6 am. His vital remained stable. Will continue to monitor.  Kennyth Lose, RN

## 2019-09-25 ENCOUNTER — Telehealth: Payer: Self-pay

## 2019-09-25 ENCOUNTER — Encounter (HOSPITAL_COMMUNITY): Payer: Self-pay | Admitting: Thoracic Surgery (Cardiothoracic Vascular Surgery)

## 2019-09-25 ENCOUNTER — Emergency Department (HOSPITAL_COMMUNITY): Payer: PRIVATE HEALTH INSURANCE

## 2019-09-25 ENCOUNTER — Encounter (HOSPITAL_COMMUNITY)
Admission: EM | Disposition: A | Discharge status: Home / Self Care | Payer: Self-pay | Source: Home / Self Care | Attending: Thoracic Surgery (Cardiothoracic Vascular Surgery)

## 2019-09-25 ENCOUNTER — Inpatient Hospital Stay (HOSPITAL_COMMUNITY)
Admission: EM | Admit: 2019-09-25 | Discharge: 2019-10-02 | DRG: 264 | Disposition: A | Discharge status: Home / Self Care | Payer: PRIVATE HEALTH INSURANCE | Attending: Thoracic Surgery (Cardiothoracic Vascular Surgery) | Admitting: Thoracic Surgery (Cardiothoracic Vascular Surgery)

## 2019-09-25 ENCOUNTER — Emergency Department (HOSPITAL_COMMUNITY): Payer: PRIVATE HEALTH INSURANCE | Admitting: Certified Registered"

## 2019-09-25 ENCOUNTER — Other Ambulatory Visit: Payer: Self-pay

## 2019-09-25 ENCOUNTER — Inpatient Hospital Stay (HOSPITAL_COMMUNITY): Payer: PRIVATE HEALTH INSURANCE

## 2019-09-25 ENCOUNTER — Inpatient Hospital Stay (HOSPITAL_COMMUNITY)
Admission: EM | Disposition: A | Discharge status: Home / Self Care | Payer: Self-pay | Source: Home / Self Care | Attending: Thoracic Surgery (Cardiothoracic Vascular Surgery)

## 2019-09-25 DIAGNOSIS — I2699 Other pulmonary embolism without acute cor pulmonale: Secondary | ICD-10-CM

## 2019-09-25 DIAGNOSIS — R570 Cardiogenic shock: Secondary | ICD-10-CM | POA: Diagnosis present

## 2019-09-25 DIAGNOSIS — D500 Iron deficiency anemia secondary to blood loss (chronic): Secondary | ICD-10-CM

## 2019-09-25 DIAGNOSIS — Z8709 Personal history of other diseases of the respiratory system: Secondary | ICD-10-CM

## 2019-09-25 DIAGNOSIS — I5033 Acute on chronic diastolic (congestive) heart failure: Secondary | ICD-10-CM | POA: Diagnosis present

## 2019-09-25 DIAGNOSIS — J90 Pleural effusion, not elsewhere classified: Secondary | ICD-10-CM | POA: Diagnosis present

## 2019-09-25 DIAGNOSIS — I314 Cardiac tamponade: Secondary | ICD-10-CM | POA: Diagnosis present

## 2019-09-25 DIAGNOSIS — J9811 Atelectasis: Secondary | ICD-10-CM

## 2019-09-25 DIAGNOSIS — D62 Acute posthemorrhagic anemia: Secondary | ICD-10-CM | POA: Diagnosis present

## 2019-09-25 DIAGNOSIS — I724 Aneurysm of artery of lower extremity: Secondary | ICD-10-CM

## 2019-09-25 DIAGNOSIS — E039 Hypothyroidism, unspecified: Secondary | ICD-10-CM | POA: Diagnosis present

## 2019-09-25 DIAGNOSIS — I2510 Atherosclerotic heart disease of native coronary artery without angina pectoris: Secondary | ICD-10-CM | POA: Diagnosis present

## 2019-09-25 DIAGNOSIS — Z20828 Contact with and (suspected) exposure to other viral communicable diseases: Secondary | ICD-10-CM | POA: Diagnosis present

## 2019-09-25 DIAGNOSIS — Z951 Presence of aortocoronary bypass graft: Secondary | ICD-10-CM | POA: Diagnosis not present

## 2019-09-25 DIAGNOSIS — R4182 Altered mental status, unspecified: Secondary | ICD-10-CM | POA: Diagnosis present

## 2019-09-25 DIAGNOSIS — I480 Paroxysmal atrial fibrillation: Secondary | ICD-10-CM | POA: Diagnosis not present

## 2019-09-25 DIAGNOSIS — Z825 Family history of asthma and other chronic lower respiratory diseases: Secondary | ICD-10-CM | POA: Diagnosis not present

## 2019-09-25 DIAGNOSIS — E785 Hyperlipidemia, unspecified: Secondary | ICD-10-CM | POA: Diagnosis present

## 2019-09-25 DIAGNOSIS — Z87891 Personal history of nicotine dependence: Secondary | ICD-10-CM

## 2019-09-25 DIAGNOSIS — I312 Hemopericardium, not elsewhere classified: Secondary | ICD-10-CM | POA: Diagnosis present

## 2019-09-25 DIAGNOSIS — Z79890 Hormone replacement therapy (postmenopausal): Secondary | ICD-10-CM

## 2019-09-25 DIAGNOSIS — E872 Acidosis: Secondary | ICD-10-CM | POA: Diagnosis not present

## 2019-09-25 DIAGNOSIS — T82838A Hemorrhage of vascular prosthetic devices, implants and grafts, initial encounter: Principal | ICD-10-CM | POA: Diagnosis present

## 2019-09-25 DIAGNOSIS — J45909 Unspecified asthma, uncomplicated: Secondary | ICD-10-CM | POA: Diagnosis present

## 2019-09-25 DIAGNOSIS — Z888 Allergy status to other drugs, medicaments and biological substances status: Secondary | ICD-10-CM

## 2019-09-25 DIAGNOSIS — Z8674 Personal history of sudden cardiac arrest: Secondary | ICD-10-CM | POA: Diagnosis not present

## 2019-09-25 DIAGNOSIS — I110 Hypertensive heart disease with heart failure: Secondary | ICD-10-CM | POA: Diagnosis present

## 2019-09-25 DIAGNOSIS — Z881 Allergy status to other antibiotic agents status: Secondary | ICD-10-CM

## 2019-09-25 DIAGNOSIS — D689 Coagulation defect, unspecified: Secondary | ICD-10-CM | POA: Diagnosis not present

## 2019-09-25 DIAGNOSIS — Z7982 Long term (current) use of aspirin: Secondary | ICD-10-CM

## 2019-09-25 DIAGNOSIS — I4581 Long QT syndrome: Secondary | ICD-10-CM | POA: Diagnosis present

## 2019-09-25 DIAGNOSIS — Y832 Surgical operation with anastomosis, bypass or graft as the cause of abnormal reaction of the patient, or of later complication, without mention of misadventure at the time of the procedure: Secondary | ICD-10-CM | POA: Diagnosis present

## 2019-09-25 DIAGNOSIS — I272 Pulmonary hypertension, unspecified: Secondary | ICD-10-CM | POA: Diagnosis present

## 2019-09-25 DIAGNOSIS — D696 Thrombocytopenia, unspecified: Secondary | ICD-10-CM | POA: Diagnosis not present

## 2019-09-25 DIAGNOSIS — Z79899 Other long term (current) drug therapy: Secondary | ICD-10-CM

## 2019-09-25 DIAGNOSIS — Y712 Prosthetic and other implants, materials and accessory cardiovascular devices associated with adverse incidents: Secondary | ICD-10-CM | POA: Diagnosis present

## 2019-09-25 HISTORY — PX: STERNOTOMY: SHX1057

## 2019-09-25 HISTORY — DX: Cardiac tamponade: I31.4

## 2019-09-25 HISTORY — PX: TEE WITHOUT CARDIOVERSION: SHX5443

## 2019-09-25 HISTORY — PX: HEMATOMA EVACUATION: SHX5118

## 2019-09-25 LAB — BASIC METABOLIC PANEL
Anion gap: 15 (ref 5–15)
Anion gap: 10 (ref 5–15)
BUN: 9 mg/dL (ref 8–23)
BUN: 10 mg/dL (ref 8–23)
CO2: 17 mmol/L — ABNORMAL LOW (ref 22–32)
CO2: 20 mmol/L — ABNORMAL LOW (ref 22–32)
Calcium: 8.5 mg/dL — ABNORMAL LOW (ref 8.9–10.3)
Calcium: 9 mg/dL (ref 8.9–10.3)
Chloride: 102 mmol/L (ref 98–111)
Chloride: 111 mmol/L (ref 98–111)
Creatinine, Ser: 1.15 mg/dL (ref 0.61–1.24)
Creatinine, Ser: 0.98 mg/dL (ref 0.61–1.24)
GFR calc Af Amer: >60 mL/min (ref >60)
GFR calc Af Amer: >60 mL/min (ref >60)
GFR calc non Af Amer: >60 mL/min (ref >60)
GFR calc non Af Amer: >60 mL/min (ref >60)
Glucose, Bld: 199 mg/dL — ABNORMAL HIGH (ref 70–99)
Glucose, Bld: 106 mg/dL — ABNORMAL HIGH (ref 70–99)
Potassium: 3.6 mmol/L (ref 3.5–5.1)
Potassium: 3.6 mmol/L (ref 3.5–5.1)
Sodium: 134 mmol/L — ABNORMAL LOW (ref 135–145)
Sodium: 141 mmol/L (ref 135–145)

## 2019-09-25 LAB — POCT I-STAT 7, (LYTES, BLD GAS, ICA,H+H)
Acid-base deficit: 11 mmol/L — ABNORMAL HIGH (ref 0.0–2.0)
Acid-base deficit: 1 mmol/L (ref 0.0–2.0)
Acid-base deficit: 16 mmol/L — ABNORMAL HIGH (ref 0.0–2.0)
Acid-base deficit: 9 mmol/L — ABNORMAL HIGH (ref 0.0–2.0)
Acid-base deficit: 8 mmol/L — ABNORMAL HIGH (ref 0.0–2.0)
Acid-base deficit: 5 mmol/L — ABNORMAL HIGH (ref 0.0–2.0)
Acid-base deficit: 3 mmol/L — ABNORMAL HIGH (ref 0.0–2.0)
Acid-base deficit: 10 mmol/L — ABNORMAL HIGH (ref 0.0–2.0)
Acid-base deficit: 4 mmol/L — ABNORMAL HIGH (ref 0.0–2.0)
Acid-base deficit: 5 mmol/L — ABNORMAL HIGH (ref 0.0–2.0)
Bicarbonate: 16 mmol/L — ABNORMAL LOW (ref 20.0–28.0)
Bicarbonate: 12.2 mmol/L — ABNORMAL LOW (ref 20.0–28.0)
Bicarbonate: 17.2 mmol/L — ABNORMAL LOW (ref 20.0–28.0)
Bicarbonate: 25.5 mmol/L (ref 20.0–28.0)
Bicarbonate: 17.9 mmol/L — ABNORMAL LOW (ref 20.0–28.0)
Bicarbonate: 20 mmol/L (ref 20.0–28.0)
Bicarbonate: 20.4 mmol/L (ref 20.0–28.0)
Bicarbonate: 13.6 mmol/L — ABNORMAL LOW (ref 20.0–28.0)
Bicarbonate: 19.2 mmol/L — ABNORMAL LOW (ref 20.0–28.0)
Bicarbonate: 19 mmol/L — ABNORMAL LOW (ref 20.0–28.0)
Calcium, Ion: 1.3 mmol/L (ref 1.15–1.40)
Calcium, Ion: 1.06 mmol/L — ABNORMAL LOW (ref 1.15–1.40)
Calcium, Ion: 1.22 mmol/L (ref 1.15–1.40)
Calcium, Ion: 1.35 mmol/L (ref 1.15–1.40)
Calcium, Ion: 0.77 mmol/L — CL (ref 1.15–1.40)
Calcium, Ion: 1.44 mmol/L — ABNORMAL HIGH (ref 1.15–1.40)
Calcium, Ion: 1.24 mmol/L (ref 1.15–1.40)
Calcium, Ion: 0.91 mmol/L — ABNORMAL LOW (ref 1.15–1.40)
Calcium, Ion: 1.28 mmol/L (ref 1.15–1.40)
Calcium, Ion: 1.13 mmol/L — ABNORMAL LOW (ref 1.15–1.40)
HCT: 23 % — ABNORMAL LOW (ref 39.0–52.0)
HCT: 20 % — ABNORMAL LOW (ref 39.0–52.0)
HCT: 22 % — ABNORMAL LOW (ref 39.0–52.0)
HCT: 26 % — ABNORMAL LOW (ref 39.0–52.0)
HCT: 30 % — ABNORMAL LOW (ref 39.0–52.0)
HCT: 29 % — ABNORMAL LOW (ref 39.0–52.0)
HCT: 25 % — ABNORMAL LOW (ref 39.0–52.0)
HCT: 18 % — ABNORMAL LOW (ref 39.0–52.0)
HCT: 25 % — ABNORMAL LOW (ref 39.0–52.0)
HCT: 23 % — ABNORMAL LOW (ref 39.0–52.0)
Hemoglobin: 7.8 g/dL — ABNORMAL LOW (ref 13.0–17.0)
Hemoglobin: 6.8 g/dL — CL (ref 13.0–17.0)
Hemoglobin: 7.5 g/dL — ABNORMAL LOW (ref 13.0–17.0)
Hemoglobin: 8.8 g/dL — ABNORMAL LOW (ref 13.0–17.0)
Hemoglobin: 10.2 g/dL — ABNORMAL LOW (ref 13.0–17.0)
Hemoglobin: 9.9 g/dL — ABNORMAL LOW (ref 13.0–17.0)
Hemoglobin: 8.5 g/dL — ABNORMAL LOW (ref 13.0–17.0)
Hemoglobin: 6.1 g/dL — CL (ref 13.0–17.0)
Hemoglobin: 8.5 g/dL — ABNORMAL LOW (ref 13.0–17.0)
Hemoglobin: 7.8 g/dL — ABNORMAL LOW (ref 13.0–17.0)
O2 Saturation: 100 %
O2 Saturation: 100 %
O2 Saturation: 100 %
O2 Saturation: 95 %
O2 Saturation: 97 %
O2 Saturation: 98 %
O2 Saturation: 94 %
O2 Saturation: 92 %
O2 Saturation: 95 %
O2 Saturation: 97 %
Patient temperature: 94.1
Patient temperature: 37.1
Patient temperature: 37.2
Patient temperature: 37.3
Patient temperature: 37.4
Potassium: 3.6 mmol/L (ref 3.5–5.1)
Potassium: 3.5 mmol/L (ref 3.5–5.1)
Potassium: 3.8 mmol/L (ref 3.5–5.1)
Potassium: 4.7 mmol/L (ref 3.5–5.1)
Potassium: 3.4 mmol/L — ABNORMAL LOW (ref 3.5–5.1)
Potassium: 3.4 mmol/L — ABNORMAL LOW (ref 3.5–5.1)
Potassium: 3.4 mmol/L — ABNORMAL LOW (ref 3.5–5.1)
Potassium: 3 mmol/L — ABNORMAL LOW (ref 3.5–5.1)
Potassium: 3.5 mmol/L (ref 3.5–5.1)
Potassium: 4.1 mmol/L (ref 3.5–5.1)
Sodium: 136 mmol/L (ref 135–145)
Sodium: 137 mmol/L (ref 135–145)
Sodium: 141 mmol/L (ref 135–145)
Sodium: 144 mmol/L (ref 135–145)
Sodium: 143 mmol/L (ref 135–145)
Sodium: 140 mmol/L (ref 135–145)
Sodium: 144 mmol/L (ref 135–145)
Sodium: 141 mmol/L (ref 135–145)
Sodium: 157 mmol/L — ABNORMAL HIGH (ref 135–145)
Sodium: 143 mmol/L (ref 135–145)
TCO2: 17 mmol/L — ABNORMAL LOW (ref 22–32)
TCO2: 13 mmol/L — ABNORMAL LOW (ref 22–32)
TCO2: 18 mmol/L — ABNORMAL LOW (ref 22–32)
TCO2: 27 mmol/L (ref 22–32)
TCO2: 19 mmol/L — ABNORMAL LOW (ref 22–32)
TCO2: 21 mmol/L — ABNORMAL LOW (ref 22–32)
TCO2: 21 mmol/L — ABNORMAL LOW (ref 22–32)
TCO2: 14 mmol/L — ABNORMAL LOW (ref 22–32)
TCO2: 20 mmol/L — ABNORMAL LOW (ref 22–32)
TCO2: 20 mmol/L — ABNORMAL LOW (ref 22–32)
pCO2 arterial: 37.2 mmHg (ref 32.0–48.0)
pCO2 arterial: 36.7 mmHg (ref 32.0–48.0)
pCO2 arterial: 38.9 mmHg (ref 32.0–48.0)
pCO2 arterial: 51.9 mmHg — ABNORMAL HIGH (ref 32.0–48.0)
pCO2 arterial: 37.6 mmHg (ref 32.0–48.0)
pCO2 arterial: 31.5 mmHg — ABNORMAL LOW (ref 32.0–48.0)
pCO2 arterial: 30.5 mmHg — ABNORMAL LOW (ref 32.0–48.0)
pCO2 arterial: 20.2 mmHg — ABNORMAL LOW (ref 32.0–48.0)
pCO2 arterial: 29.7 mmHg — ABNORMAL LOW (ref 32.0–48.0)
pCO2 arterial: 30.1 mmHg — ABNORMAL LOW (ref 32.0–48.0)
pH, Arterial: 7.241 — ABNORMAL LOW (ref 7.350–7.450)
pH, Arterial: 7.129 — CL (ref 7.350–7.450)
pH, Arterial: 7.254 — ABNORMAL LOW (ref 7.350–7.450)
pH, Arterial: 7.3 — ABNORMAL LOW (ref 7.350–7.450)
pH, Arterial: 7.286 — ABNORMAL LOW (ref 7.350–7.450)
pH, Arterial: 7.399 (ref 7.350–7.450)
pH, Arterial: 7.434 (ref 7.350–7.450)
pH, Arterial: 7.42 (ref 7.350–7.450)
pH, Arterial: 7.439 (ref 7.350–7.450)
pH, Arterial: 7.409 (ref 7.350–7.450)
pO2, Arterial: 334 mmHg — ABNORMAL HIGH (ref 83.0–108.0)
pO2, Arterial: 408 mmHg — ABNORMAL HIGH (ref 83.0–108.0)
pO2, Arterial: 413 mmHg — ABNORMAL HIGH (ref 83.0–108.0)
pO2, Arterial: 85 mmHg (ref 83.0–108.0)
pO2, Arterial: 96 mmHg (ref 83.0–108.0)
pO2, Arterial: 86 mmHg (ref 83.0–108.0)
pO2, Arterial: 68 mmHg — ABNORMAL LOW (ref 83.0–108.0)
pO2, Arterial: 60 mmHg — ABNORMAL LOW (ref 83.0–108.0)
pO2, Arterial: 73 mmHg — ABNORMAL LOW (ref 83.0–108.0)
pO2, Arterial: 95 mmHg (ref 83.0–108.0)

## 2019-09-25 LAB — GLUCOSE, CAPILLARY
Glucose-Capillary: 100 mg/dL — ABNORMAL HIGH (ref 70–99)
Glucose-Capillary: 159 mg/dL — ABNORMAL HIGH (ref 70–99)
Glucose-Capillary: 174 mg/dL — ABNORMAL HIGH (ref 70–99)
Glucose-Capillary: 86 mg/dL (ref 70–99)
Glucose-Capillary: 87 mg/dL (ref 70–99)
Glucose-Capillary: 88 mg/dL (ref 70–99)
Glucose-Capillary: 93 mg/dL (ref 70–99)
Glucose-Capillary: 93 mg/dL (ref 70–99)
Glucose-Capillary: 97 mg/dL (ref 70–99)

## 2019-09-25 LAB — CBC
HCT: 29.5 % — ABNORMAL LOW (ref 39.0–52.0)
HCT: 32.3 % — ABNORMAL LOW (ref 39.0–52.0)
HCT: 29.3 % — ABNORMAL LOW (ref 39.0–52.0)
Hemoglobin: 8.8 g/dL — ABNORMAL LOW (ref 13.0–17.0)
Hemoglobin: 10.5 g/dL — ABNORMAL LOW (ref 13.0–17.0)
Hemoglobin: 9.8 g/dL — ABNORMAL LOW (ref 13.0–17.0)
MCH: 28.9 pg (ref 26.0–34.0)
MCH: 29.6 pg (ref 26.0–34.0)
MCH: 29.8 pg (ref 26.0–34.0)
MCHC: 29.8 g/dL — ABNORMAL LOW (ref 30.0–36.0)
MCHC: 32.5 g/dL (ref 30.0–36.0)
MCHC: 33.4 g/dL (ref 30.0–36.0)
MCV: 96.7 fL (ref 80.0–100.0)
MCV: 91 fL (ref 80.0–100.0)
MCV: 89.1 fL (ref 80.0–100.0)
Platelets: 523 10*3/uL — ABNORMAL HIGH (ref 150–400)
Platelets: 80 10*3/uL — ABNORMAL LOW (ref 150–400)
Platelets: 93 10*3/uL — ABNORMAL LOW (ref 150–400)
RBC: 3.05 MIL/uL — ABNORMAL LOW (ref 4.22–5.81)
RBC: 3.55 MIL/uL — ABNORMAL LOW (ref 4.22–5.81)
RBC: 3.29 MIL/uL — ABNORMAL LOW (ref 4.22–5.81)
RDW: 17.4 % — ABNORMAL HIGH (ref 11.5–15.5)
RDW: 14.7 % (ref 11.5–15.5)
RDW: 15.4 % (ref 11.5–15.5)
WBC: 13.1 10*3/uL — ABNORMAL HIGH (ref 4.0–10.5)
WBC: 12.1 10*3/uL — ABNORMAL HIGH (ref 4.0–10.5)
WBC: 11.4 10*3/uL — ABNORMAL HIGH (ref 4.0–10.5)
nRBC: 0 % (ref 0.0–0.2)
nRBC: 0 % (ref 0.0–0.2)
nRBC: 0 % (ref 0.0–0.2)

## 2019-09-25 LAB — PREPARE RBC (CROSSMATCH)

## 2019-09-25 LAB — POCT I-STAT, CHEM 8
BUN: 9 mg/dL (ref 8–23)
BUN: 9 mg/dL (ref 8–23)
Calcium, Ion: 1.29 mmol/L (ref 1.15–1.40)
Calcium, Ion: 1.4 mmol/L (ref 1.15–1.40)
Chloride: 107 mmol/L (ref 98–111)
Chloride: 105 mmol/L (ref 98–111)
Creatinine, Ser: 0.9 mg/dL (ref 0.61–1.24)
Creatinine, Ser: 0.8 mg/dL (ref 0.61–1.24)
Glucose, Bld: 215 mg/dL — ABNORMAL HIGH (ref 70–99)
Glucose, Bld: 192 mg/dL — ABNORMAL HIGH (ref 70–99)
HCT: 24 % — ABNORMAL LOW (ref 39.0–52.0)
HCT: 26 % — ABNORMAL LOW (ref 39.0–52.0)
Hemoglobin: 8.2 g/dL — ABNORMAL LOW (ref 13.0–17.0)
Hemoglobin: 8.8 g/dL — ABNORMAL LOW (ref 13.0–17.0)
Potassium: 3.6 mmol/L (ref 3.5–5.1)
Potassium: 3.4 mmol/L — ABNORMAL LOW (ref 3.5–5.1)
Sodium: 136 mmol/L (ref 135–145)
Sodium: 140 mmol/L (ref 135–145)
TCO2: 17 mmol/L — ABNORMAL LOW (ref 22–32)
TCO2: 18 mmol/L — ABNORMAL LOW (ref 22–32)

## 2019-09-25 LAB — SARS CORONAVIRUS 2 (TAT 6-24 HRS): SARS Coronavirus 2: NEGATIVE

## 2019-09-25 LAB — TROPONIN I (HIGH SENSITIVITY)
Troponin I (High Sensitivity): 267 ng/L (ref <18)
Troponin I (High Sensitivity): 482 ng/L (ref <18)

## 2019-09-25 LAB — PROTIME-INR
INR: 1.5 — ABNORMAL HIGH (ref 0.8–1.2)
INR: 1.8 — ABNORMAL HIGH (ref 0.8–1.2)
Prothrombin Time: 18.3 seconds — ABNORMAL HIGH (ref 11.4–15.2)
Prothrombin Time: 21 seconds — ABNORMAL HIGH (ref 11.4–15.2)

## 2019-09-25 LAB — APTT
aPTT: 34 seconds (ref 24–36)
aPTT: 51 seconds — ABNORMAL HIGH (ref 24–36)

## 2019-09-25 LAB — FIBRINOGEN: Fibrinogen: 418 mg/dL (ref 210–475)

## 2019-09-25 LAB — ABO/RH: ABO/RH(D): A POS

## 2019-09-25 LAB — MAGNESIUM: Magnesium: 2.4 mg/dL (ref 1.7–2.4)

## 2019-09-25 SURGERY — CORONARY/GRAFT ACUTE MI REVASCULARIZATION
Anesthesia: LOCAL

## 2019-09-25 SURGERY — STERNOTOMY
Anesthesia: General | Site: Chest

## 2019-09-25 MED ORDER — SODIUM CHLORIDE 0.9% FLUSH
3.0000 mL | INTRAVENOUS | Status: DC | PRN
  Administered 2019-09-29 – 2019-09-30 (×3): 3 mL via INTRAVENOUS
  Filled 2019-09-25 (×3): qty 3

## 2019-09-25 MED ORDER — POTASSIUM CHLORIDE 10 MEQ/50ML IV SOLN
10.0000 meq | INTRAVENOUS | Status: AC
  Administered 2019-09-25 (×3): 10 meq via INTRAVENOUS
  Filled 2019-09-25 (×2): qty 50

## 2019-09-25 MED ORDER — SODIUM CHLORIDE 0.9 % IV SOLN
INTRAVENOUS | Status: DC
  Administered 2019-09-25: 07:00:00 via INTRAVENOUS

## 2019-09-25 MED ORDER — PHENYLEPHRINE 40 MCG/ML (10ML) SYRINGE FOR IV PUSH (FOR BLOOD PRESSURE SUPPORT)
PREFILLED_SYRINGE | INTRAVENOUS | Status: DC | PRN
  Administered 2019-09-25 (×2): 120 ug via INTRAVENOUS

## 2019-09-25 MED ORDER — VANCOMYCIN HCL IN DEXTROSE 1-5 GM/200ML-% IV SOLN
1000.0000 mg | Freq: Once | INTRAVENOUS | Status: AC
  Administered 2019-09-25: 1000 mg via INTRAVENOUS
  Filled 2019-09-25: qty 200

## 2019-09-25 MED ORDER — ALBUMIN HUMAN 5 % IV SOLN
250.0000 mL | INTRAVENOUS | Status: AC | PRN
  Administered 2019-09-25 (×4): 12.5 g via INTRAVENOUS
  Filled 2019-09-25: qty 750

## 2019-09-25 MED ORDER — METOPROLOL TARTRATE 5 MG/5ML IV SOLN
2.5000 mg | INTRAVENOUS | Status: DC | PRN
  Administered 2019-09-27: 2 mg via INTRAVENOUS
  Administered 2019-09-27: 3 mg via INTRAVENOUS
  Filled 2019-09-25 (×2): qty 5

## 2019-09-25 MED ORDER — LACTATED RINGERS IV SOLN: 500.0000 mL | Freq: Once | INTRAVENOUS | Status: DC | PRN

## 2019-09-25 MED ORDER — ACETAMINOPHEN 325 MG PO TABS
650.0000 mg | ORAL_TABLET | Freq: Once | ORAL | Status: AC
  Administered 2019-09-25: 650 mg via ORAL
  Filled 2019-09-25: qty 2

## 2019-09-25 MED ORDER — 0.9 % SODIUM CHLORIDE (POUR BTL) OPTIME
TOPICAL | Status: DC | PRN
  Administered 2019-09-25: 6000 mL

## 2019-09-25 MED ORDER — VANCOMYCIN HCL 10 G IV SOLR
1250.0000 mg | INTRAVENOUS | Status: DC
  Filled 2019-09-25: qty 1250

## 2019-09-25 MED ORDER — ACETAMINOPHEN 500 MG PO TABS: 1000.0000 mg | ORAL_TABLET | Freq: Four times a day (QID) | ORAL | Status: DC

## 2019-09-25 MED ORDER — SODIUM CHLORIDE 0.9 % IV SOLN
INTRAVENOUS | Status: DC | PRN
  Administered 2019-09-25: 500 mL

## 2019-09-25 MED ORDER — OXYCODONE HCL 5 MG PO TABS
5.0000 mg | ORAL_TABLET | ORAL | Status: DC | PRN
  Administered 2019-09-25: 5 mg via ORAL
  Filled 2019-09-25: qty 1

## 2019-09-25 MED ORDER — MIDAZOLAM HCL 2 MG/2ML IJ SOLN
INTRAMUSCULAR | Status: AC
  Filled 2019-09-25: qty 2

## 2019-09-25 MED ORDER — SODIUM CHLORIDE 0.9 % IV SOLN
1.5000 g | Freq: Two times a day (BID) | INTRAVENOUS | Status: AC
  Administered 2019-09-25 – 2019-09-26 (×4): 1.5 g via INTRAVENOUS
  Filled 2019-09-25 (×4): qty 1.5

## 2019-09-25 MED ORDER — EPINEPHRINE HCL 5 MG/250ML IV SOLN IN NS: 0.0000 ug/min | INTRAVENOUS | Status: DC

## 2019-09-25 MED ORDER — MIDAZOLAM HCL 2 MG/2ML IJ SOLN: 2.0000 mg | INTRAMUSCULAR | Status: DC | PRN

## 2019-09-25 MED ORDER — NITROGLYCERIN IN D5W 200-5 MCG/ML-% IV SOLN: 2.0000 ug/min | INTRAVENOUS | Status: DC

## 2019-09-25 MED ORDER — SODIUM CHLORIDE 0.9% FLUSH
3.0000 mL | Freq: Two times a day (BID) | INTRAVENOUS | Status: DC
  Administered 2019-09-26 – 2019-10-02 (×12): 3 mL via INTRAVENOUS

## 2019-09-25 MED ORDER — SODIUM CHLORIDE 0.9 % IV SOLN
INTRAVENOUS | Status: AC
  Filled 2019-09-25: qty 1.2

## 2019-09-25 MED ORDER — TRANEXAMIC ACID 1000 MG/10ML IV SOLN
1.5000 mg/kg/h | INTRAVENOUS | Status: DC
  Filled 2019-09-25: qty 25

## 2019-09-25 MED ORDER — VANCOMYCIN HCL 1000 MG IV SOLR
INTRAVENOUS | Status: DC | PRN
  Administered 2019-09-25: 1000 mg via INTRAVENOUS

## 2019-09-25 MED ORDER — HEPARIN SODIUM (PORCINE) 1000 UNIT/ML IJ SOLN
INTRAMUSCULAR | Status: DC | PRN
  Administered 2019-09-25: 24000 [IU] via INTRAVENOUS

## 2019-09-25 MED ORDER — CHLORHEXIDINE GLUCONATE 0.12 % MT SOLN
15.0000 mL | OROMUCOSAL | Status: AC
  Administered 2019-09-25: 15 mL via OROMUCOSAL
  Filled 2019-09-25: qty 15

## 2019-09-25 MED ORDER — LACTATED RINGERS IV SOLN
INTRAVENOUS | Status: DC | PRN
  Administered 2019-09-25 (×2): via INTRAVENOUS

## 2019-09-25 MED ORDER — DOPAMINE-DEXTROSE 3.2-5 MG/ML-% IV SOLN: 0.0000 ug/kg/min | INTRAVENOUS | Status: DC

## 2019-09-25 MED ORDER — TRAMADOL HCL 50 MG PO TABS
50.0000 mg | ORAL_TABLET | ORAL | Status: DC | PRN
  Administered 2019-09-25: 50 mg via ORAL
  Administered 2019-09-26 – 2019-09-28 (×7): 100 mg via ORAL
  Administered 2019-09-28: 50 mg via ORAL
  Administered 2019-09-29: 100 mg via ORAL
  Filled 2019-09-25 (×2): qty 2
  Filled 2019-09-25: qty 1
  Filled 2019-09-25 (×2): qty 2
  Filled 2019-09-25: qty 1
  Filled 2019-09-25: qty 2
  Filled 2019-09-25: qty 1
  Filled 2019-09-25: qty 2
  Filled 2019-09-25: qty 1
  Filled 2019-09-25: qty 2

## 2019-09-25 MED ORDER — NOREPINEPHRINE 4 MG/250ML-% IV SOLN: 0.0000 ug/min | INTRAVENOUS | Status: DC

## 2019-09-25 MED ORDER — SODIUM CHLORIDE 0.9 % IV SOLN: INTRAVENOUS | Status: DC

## 2019-09-25 MED ORDER — IOHEXOL 350 MG/ML SOLN
100.0000 mL | Freq: Once | INTRAVENOUS | Status: AC | PRN
  Administered 2019-09-25: 02:00:00 100 mL via INTRAVENOUS

## 2019-09-25 MED ORDER — PROPOFOL 10 MG/ML IV BOLUS
INTRAVENOUS | Status: AC
  Filled 2019-09-25: qty 20

## 2019-09-25 MED ORDER — ACETAMINOPHEN 160 MG/5ML PO SOLN: 1000.0000 mg | Freq: Four times a day (QID) | ORAL | Status: AC

## 2019-09-25 MED ORDER — CHLORHEXIDINE GLUCONATE 0.12% ORAL RINSE (MEDLINE KIT): 15.0000 mL | Freq: Two times a day (BID) | OROMUCOSAL | Status: DC

## 2019-09-25 MED ORDER — ACETAMINOPHEN 160 MG/5ML PO SOLN: 1000.0000 mg | Freq: Four times a day (QID) | ORAL | Status: DC

## 2019-09-25 MED ORDER — SODIUM CHLORIDE 0.9 % IV SOLN
INTRAVENOUS | Status: DC | PRN
  Administered 2019-09-25: 1.5 g via INTRAVENOUS

## 2019-09-25 MED ORDER — ACETAMINOPHEN 650 MG RE SUPP: 650.0000 mg | Freq: Once | RECTAL | Status: DC

## 2019-09-25 MED ORDER — ETOMIDATE 2 MG/ML IV SOLN
INTRAVENOUS | Status: AC | PRN
  Administered 2019-09-25: 20 mg via INTRAVENOUS

## 2019-09-25 MED ORDER — CALCIUM CHLORIDE 10 % IV SOLN
INTRAVENOUS | Status: DC | PRN
  Administered 2019-09-25: 700 mg via INTRAVENOUS
  Administered 2019-09-25: 500 mg via INTRAVENOUS
  Administered 2019-09-25: 250 mg via INTRAVENOUS
  Administered 2019-09-25 (×2): 500 mg via INTRAVENOUS
  Administered 2019-09-25: 250 mg via INTRAVENOUS
  Administered 2019-09-25: 300 mg via INTRAVENOUS

## 2019-09-25 MED ORDER — SODIUM CHLORIDE 0.45 % IV SOLN: INTRAVENOUS | Status: DC | PRN

## 2019-09-25 MED ORDER — NOREPINEPHRINE 4 MG/250ML-% IV SOLN
INTRAVENOUS | Status: AC
  Filled 2019-09-25: qty 250

## 2019-09-25 MED ORDER — POTASSIUM CHLORIDE 10 MEQ/50ML IV SOLN
INTRAVENOUS | Status: AC
  Administered 2019-09-25: 15:00:00 10 meq
  Filled 2019-09-25: qty 50

## 2019-09-25 MED ORDER — INSULIN ASPART 100 UNIT/ML ~~LOC~~ SOLN: 0.0000 [IU] | SUBCUTANEOUS | Status: DC

## 2019-09-25 MED ORDER — NITROGLYCERIN IN D5W 200-5 MCG/ML-% IV SOLN: 0.0000 ug/min | INTRAVENOUS | Status: DC

## 2019-09-25 MED ORDER — MILRINONE LACTATE IN DEXTROSE 20-5 MG/100ML-% IV SOLN
0.3000 ug/kg/min | INTRAVENOUS | Status: DC
  Filled 2019-09-25: qty 100

## 2019-09-25 MED ORDER — SODIUM CHLORIDE 0.9 % IV SOLN
INTRAVENOUS | Status: DC
  Filled 2019-09-25: qty 30

## 2019-09-25 MED ORDER — LACTATED RINGERS IV SOLN
INTRAVENOUS | Status: DC | PRN
  Administered 2019-09-25 (×2): via INTRAVENOUS

## 2019-09-25 MED ORDER — SUCCINYLCHOLINE CHLORIDE 20 MG/ML IJ SOLN
INTRAMUSCULAR | Status: AC | PRN
  Administered 2019-09-25: 100 mg via INTRAVENOUS

## 2019-09-25 MED ORDER — SODIUM CHLORIDE 0.9 % IV SOLN: 250.0000 mL | INTRAVENOUS | Status: DC

## 2019-09-25 MED ORDER — TRANEXAMIC ACID (OHS) BOLUS VIA INFUSION
15.0000 mg/kg | INTRAVENOUS | Status: AC
  Administered 2019-09-25: 987 mg via INTRAVENOUS
  Filled 2019-09-25: qty 987

## 2019-09-25 MED ORDER — LACTATED RINGERS IV SOLN: INTRAVENOUS | Status: DC

## 2019-09-25 MED ORDER — MAGNESIUM SULFATE 50 % IJ SOLN
40.0000 meq | INTRAMUSCULAR | Status: DC
  Filled 2019-09-25: qty 9.85

## 2019-09-25 MED ORDER — FENTANYL CITRATE (PF) 250 MCG/5ML IJ SOLN
INTRAMUSCULAR | Status: AC
  Filled 2019-09-25: qty 20

## 2019-09-25 MED ORDER — BISACODYL 10 MG RE SUPP: 10.0000 mg | Freq: Every day | RECTAL | Status: DC

## 2019-09-25 MED ORDER — INSULIN REGULAR(HUMAN) IN NACL 100-0.9 UT/100ML-% IV SOLN
INTRAVENOUS | Status: DC | PRN
  Administered 2019-09-25: 1 [IU]/h via INTRAVENOUS

## 2019-09-25 MED ORDER — POTASSIUM CHLORIDE 10 MEQ/50ML IV SOLN
10.0000 meq | INTRAVENOUS | Status: AC
  Administered 2019-09-25 (×3): 10 meq via INTRAVENOUS

## 2019-09-25 MED ORDER — FAMOTIDINE IN NACL 20-0.9 MG/50ML-% IV SOLN
20.0000 mg | Freq: Two times a day (BID) | INTRAVENOUS | Status: AC
  Administered 2019-09-25: 07:00:00 20 mg via INTRAVENOUS

## 2019-09-25 MED ORDER — DEXMEDETOMIDINE HCL IN NACL 400 MCG/100ML IV SOLN: 0.0000 ug/kg/h | INTRAVENOUS | Status: DC

## 2019-09-25 MED ORDER — ASPIRIN 300 MG RE SUPP
300.0000 mg | Freq: Once | RECTAL | Status: AC
  Administered 2019-09-25: 300 mg via RECTAL

## 2019-09-25 MED ORDER — HEPARIN SODIUM (PORCINE) 5000 UNIT/ML IJ SOLN: 4000.0000 [IU] | Freq: Once | INTRAMUSCULAR | Status: DC

## 2019-09-25 MED ORDER — DEXMEDETOMIDINE HCL IN NACL 400 MCG/100ML IV SOLN
INTRAVENOUS | Status: DC | PRN
  Administered 2019-09-25: .2 ug/kg/h via INTRAVENOUS

## 2019-09-25 MED ORDER — ROCURONIUM BROMIDE 10 MG/ML (PF) SYRINGE
PREFILLED_SYRINGE | INTRAVENOUS | Status: DC | PRN
  Administered 2019-09-25 (×2): 50 mg via INTRAVENOUS

## 2019-09-25 MED ORDER — MAGNESIUM SULFATE 4 GM/100ML IV SOLN
4.0000 g | Freq: Once | INTRAVENOUS | Status: AC
  Administered 2019-09-25: 07:00:00 4 g via INTRAVENOUS
  Filled 2019-09-25: qty 100

## 2019-09-25 MED ORDER — INSULIN REGULAR(HUMAN) IN NACL 100-0.9 UT/100ML-% IV SOLN: INTRAVENOUS | Status: DC

## 2019-09-25 MED ORDER — SODIUM CHLORIDE 0.9% FLUSH: 3.0000 mL | Freq: Once | INTRAVENOUS | Status: DC

## 2019-09-25 MED ORDER — VASOPRESSIN 20 UNIT/ML IV SOLN
INTRAVENOUS | Status: DC | PRN
  Administered 2019-09-25 (×2): 2 [IU] via INTRAVENOUS
  Administered 2019-09-25 (×2): 1 [IU] via INTRAVENOUS

## 2019-09-25 MED ORDER — VANCOMYCIN HCL 1000 MG IV SOLR
INTRAVENOUS | Status: DC | PRN
  Administered 2019-09-25: 1000 mg

## 2019-09-25 MED ORDER — ASPIRIN EC 325 MG PO TBEC
325.0000 mg | DELAYED_RELEASE_TABLET | Freq: Every day | ORAL | Status: DC
  Administered 2019-09-26 – 2019-09-27 (×2): 325 mg via ORAL
  Filled 2019-09-25 (×3): qty 1

## 2019-09-25 MED ORDER — COAGULATION FACTOR VIIA RECOMB 1 MG IV SOLR
90.0000 ug/kg | INTRAVENOUS | Status: AC
  Administered 2019-09-25: 07:00:00 6000 ug via INTRAVENOUS
  Filled 2019-09-25: qty 1

## 2019-09-25 MED ORDER — ONDANSETRON HCL 4 MG/2ML IJ SOLN
4.0000 mg | Freq: Four times a day (QID) | INTRAMUSCULAR | Status: DC | PRN
  Administered 2019-09-25: 4 mg via INTRAVENOUS
  Filled 2019-09-25: qty 2

## 2019-09-25 MED ORDER — PROTAMINE SULFATE 10 MG/ML IV SOLN
INTRAVENOUS | Status: DC | PRN
  Administered 2019-09-25 (×8): 30 mg via INTRAVENOUS

## 2019-09-25 MED ORDER — ACETAMINOPHEN 500 MG PO TABS
1000.0000 mg | ORAL_TABLET | Freq: Four times a day (QID) | ORAL | Status: AC
  Administered 2019-09-26 – 2019-09-30 (×18): 1000 mg via ORAL
  Filled 2019-09-25 (×18): qty 2

## 2019-09-25 MED ORDER — POTASSIUM CHLORIDE 2 MEQ/ML IV SOLN
80.0000 meq | INTRAVENOUS | Status: DC
  Filled 2019-09-25: qty 40

## 2019-09-25 MED ORDER — SODIUM CHLORIDE 0.9 % IV SOLN
1.5000 g | INTRAVENOUS | Status: DC
  Filled 2019-09-25: qty 1.5

## 2019-09-25 MED ORDER — SODIUM CHLORIDE 0.9 % IV SOLN
750.0000 mg | INTRAVENOUS | Status: DC
  Filled 2019-09-25 (×2): qty 750

## 2019-09-25 MED ORDER — PANTOPRAZOLE SODIUM 40 MG PO TBEC
40.0000 mg | DELAYED_RELEASE_TABLET | Freq: Every day | ORAL | Status: DC
  Administered 2019-09-27: 40 mg via ORAL
  Filled 2019-09-25 (×2): qty 1

## 2019-09-25 MED ORDER — DOPAMINE-DEXTROSE 3.2-5 MG/ML-% IV SOLN
0.0000 ug/kg/min | INTRAVENOUS | Status: DC
  Administered 2019-09-25: 3 ug/kg/min via INTRAVENOUS
  Filled 2019-09-25: qty 250

## 2019-09-25 MED ORDER — VASOPRESSIN 20 UNIT/ML IV SOLN
INTRAVENOUS | Status: AC
  Filled 2019-09-25: qty 1

## 2019-09-25 MED ORDER — NOREPINEPHRINE 4 MG/250ML-% IV SOLN
0.0000 ug/min | INTRAVENOUS | Status: DC
  Administered 2019-09-25: 02:00:00 10 ug/min via INTRAVENOUS

## 2019-09-25 MED ORDER — MORPHINE SULFATE (PF) 2 MG/ML IV SOLN
1.0000 mg | INTRAVENOUS | Status: DC | PRN
  Administered 2019-09-25 – 2019-10-01 (×4): 2 mg via INTRAVENOUS
  Filled 2019-09-25 (×2): qty 1

## 2019-09-25 MED ORDER — ORAL CARE MOUTH RINSE
15.0000 mL | OROMUCOSAL | Status: DC
  Administered 2019-09-25 (×2): 15 mL via OROMUCOSAL

## 2019-09-25 MED ORDER — FENTANYL CITRATE (PF) 100 MCG/2ML IJ SOLN
100.0000 ug | Freq: Once | INTRAMUSCULAR | Status: AC
  Administered 2019-09-25: 100 ug via INTRAVENOUS

## 2019-09-25 MED ORDER — ALBUMIN HUMAN 5 % IV SOLN
INTRAVENOUS | Status: AC
  Administered 2019-09-25: 11:00:00 12.5 g
  Filled 2019-09-25: qty 250

## 2019-09-25 MED ORDER — PLASMA-LYTE 148 IV SOLN
INTRAVENOUS | Status: DC
  Filled 2019-09-25: qty 2.5

## 2019-09-25 MED ORDER — BISACODYL 5 MG PO TBEC
10.0000 mg | DELAYED_RELEASE_TABLET | Freq: Every day | ORAL | Status: DC
  Administered 2019-09-26 – 2019-09-29 (×4): 10 mg via ORAL
  Filled 2019-09-25 (×4): qty 2

## 2019-09-25 MED ORDER — DEXMEDETOMIDINE HCL IN NACL 400 MCG/100ML IV SOLN
0.1000 ug/kg/h | INTRAVENOUS | Status: DC
  Filled 2019-09-25: qty 100

## 2019-09-25 MED ORDER — FENTANYL CITRATE (PF) 100 MCG/2ML IJ SOLN
INTRAMUSCULAR | Status: DC | PRN
  Administered 2019-09-25: 100 ug via INTRAVENOUS
  Administered 2019-09-25 (×2): 150 ug via INTRAVENOUS

## 2019-09-25 MED ORDER — MIDAZOLAM HCL 2 MG/2ML IJ SOLN: 5.0000 mg | Freq: Once | INTRAMUSCULAR | Status: DC

## 2019-09-25 MED ORDER — ASPIRIN 81 MG PO CHEW: 324.0000 mg | CHEWABLE_TABLET | Freq: Every day | ORAL | Status: DC

## 2019-09-25 MED ORDER — ACETAMINOPHEN 160 MG/5ML PO SOLN
650.0000 mg | Freq: Once | ORAL | Status: DC
  Filled 2019-09-25: qty 20.3

## 2019-09-25 MED ORDER — EPHEDRINE SULFATE 50 MG/ML IJ SOLN
INTRAMUSCULAR | Status: DC | PRN
  Administered 2019-09-25: 5 mg via INTRAVENOUS

## 2019-09-25 MED ORDER — VANCOMYCIN HCL 1000 MG IV SOLR
INTRAVENOUS | Status: AC
  Filled 2019-09-25: qty 1000

## 2019-09-25 MED ORDER — PHENYLEPHRINE HCL-NACL 20-0.9 MG/250ML-% IV SOLN
30.0000 ug/min | INTRAVENOUS | Status: DC
  Filled 2019-09-25: qty 250

## 2019-09-25 MED ORDER — MANNITOL 20 % IV SOLN
Freq: Once | INTRAVENOUS | Status: DC
  Filled 2019-09-25: qty 13

## 2019-09-25 MED ORDER — TRANEXAMIC ACID 1000 MG/10ML IV SOLN
INTRAVENOUS | Status: DC | PRN
  Administered 2019-09-25: 1.5 mg/kg/h via INTRAVENOUS

## 2019-09-25 MED ORDER — MIDAZOLAM HCL 2 MG/2ML IJ SOLN
INTRAMUSCULAR | Status: DC | PRN
  Administered 2019-09-25: 2 mg via INTRAVENOUS

## 2019-09-25 MED ORDER — VANCOMYCIN HCL 1000 MG IV SOLR
INTRAVENOUS | Status: DC
  Filled 2019-09-25: qty 1000

## 2019-09-25 MED ORDER — CHLORHEXIDINE GLUCONATE CLOTH 2 % EX PADS
6.0000 | MEDICATED_PAD | Freq: Every day | CUTANEOUS | Status: DC
  Administered 2019-09-25 – 2019-09-28 (×4): 6 via TOPICAL

## 2019-09-25 MED ORDER — PHENYLEPHRINE HCL-NACL 20-0.9 MG/250ML-% IV SOLN: 0.0000 ug/min | INTRAVENOUS | Status: DC

## 2019-09-25 MED ORDER — TRANEXAMIC ACID (OHS) PUMP PRIME SOLUTION
2.0000 mg/kg | INTRAVENOUS | Status: DC
  Filled 2019-09-25: qty 1.32

## 2019-09-25 MED ORDER — SODIUM CHLORIDE (PF) 0.9 % IJ SOLN
OROMUCOSAL | Status: DC | PRN
  Administered 2019-09-25 (×3): 4 mL via TOPICAL

## 2019-09-25 MED ORDER — INSULIN REGULAR BOLUS VIA INFUSION
0.0000 [IU] | Freq: Three times a day (TID) | INTRAVENOUS | Status: DC
  Filled 2019-09-25: qty 10

## 2019-09-25 MED ORDER — DOCUSATE SODIUM 100 MG PO CAPS
200.0000 mg | ORAL_CAPSULE | Freq: Every day | ORAL | Status: DC
  Administered 2019-09-26 – 2019-09-29 (×4): 200 mg via ORAL
  Filled 2019-09-25 (×4): qty 2

## 2019-09-25 SURGICAL SUPPLY — 96 items
ADH SKN CLS LQ APL DERMABOND (GAUZE/BANDAGES/DRESSINGS) ×1
APL SKNCLS STERI-STRIP NONHPOA (GAUZE/BANDAGES/DRESSINGS) ×1
ATTRACTOMAT 16X20 MAGNETIC DRP (DRAPES) ×1 IMPLANT
BENZOIN TINCTURE PRP APPL 2/3 (GAUZE/BANDAGES/DRESSINGS) ×2 IMPLANT
BLADE CLIPPER SURG (BLADE) ×1 IMPLANT
BLADE SURG 11 STRL SS (BLADE) ×2 IMPLANT
CANISTER SUCT 3000ML PPV (MISCELLANEOUS) ×2 IMPLANT
CANNULA AORTIC ROOT 9FR (CANNULA) ×1 IMPLANT
CANNULA EZ GLIDE AORTIC 21FR (CANNULA) ×1 IMPLANT
CANNULA FEM VENOUS REMOTE 22FR (CANNULA) ×1 IMPLANT
CANNULA OPTISITE PERFUSION 18F (CANNULA) ×1 IMPLANT
CANNULA SUMP PERICARDIAL (CANNULA) ×1 IMPLANT
CATH THORACIC 28FR (CATHETERS) ×2 IMPLANT
CATH THORACIC 28FR RT ANG (CATHETERS) IMPLANT
CATH THORACIC 36FR (CATHETERS) ×1 IMPLANT
CATH THORACIC 36FR RT ANG (CATHETERS) IMPLANT
CONN ST 1/4X3/8  BEN (MISCELLANEOUS) ×3
CONN ST 1/4X3/8 BEN (MISCELLANEOUS) IMPLANT
CONT SPEC 4OZ CLIKSEAL STRL BL (MISCELLANEOUS) IMPLANT
COVER SURGICAL LIGHT HANDLE (MISCELLANEOUS) ×4 IMPLANT
COVER WAND RF STERILE (DRAPES) ×1 IMPLANT
DERMABOND ADHESIVE PROPEN (GAUZE/BANDAGES/DRESSINGS) ×1
DERMABOND ADVANCED .7 DNX6 (GAUZE/BANDAGES/DRESSINGS) IMPLANT
DEVICE CLOSURE PERCLS PRGLD 6F (VASCULAR PRODUCTS) IMPLANT
DRAIN CHANNEL 28F RND 3/8 FF (WOUND CARE) ×2 IMPLANT
DRAIN CHANNEL 32F RND 10.7 FF (WOUND CARE) ×1 IMPLANT
DRAPE CARDIOVASCULAR INCISE (DRAPES) ×2
DRAPE LAPAROSCOPIC ABDOMINAL (DRAPES) ×1 IMPLANT
DRAPE SRG 135.5X100X77XCV INC (DRAPES) IMPLANT
DRSG AQUACEL AG ADV 3.5X14 (GAUZE/BANDAGES/DRESSINGS) ×1 IMPLANT
ELECT BLADE 4.0 EZ CLEAN MEGAD (MISCELLANEOUS) ×4
ELECT REM PT RETURN 9FT ADLT (ELECTROSURGICAL) ×2
ELECTRODE BLDE 4.0 EZ CLN MEGD (MISCELLANEOUS) ×1 IMPLANT
ELECTRODE REM PT RTRN 9FT ADLT (ELECTROSURGICAL) ×1 IMPLANT
GAUZE SPONGE 4X4 12PLY STRL (GAUZE/BANDAGES/DRESSINGS) ×1 IMPLANT
GLOVE BIO SURGEON STRL SZ 6 (GLOVE) ×1 IMPLANT
GLOVE BIO SURGEON STRL SZ 6.5 (GLOVE) ×3 IMPLANT
GLOVE BIO SURGEON STRL SZ7 (GLOVE) ×1 IMPLANT
GLOVE BIO SURGEON STRL SZ7.5 (GLOVE) ×1 IMPLANT
GLOVE BIOGEL PI IND STRL 6 (GLOVE) IMPLANT
GLOVE BIOGEL PI IND STRL 6.5 (GLOVE) IMPLANT
GLOVE BIOGEL PI INDICATOR 6 (GLOVE) ×2
GLOVE BIOGEL PI INDICATOR 6.5 (GLOVE) ×2
GLOVE ORTHO TXT STRL SZ7.5 (GLOVE) ×4 IMPLANT
GOWN STRL REUS W/ TWL XL LVL3 (GOWN DISPOSABLE) ×1 IMPLANT
GOWN STRL REUS W/TWL XL LVL3 (GOWN DISPOSABLE) ×12
HEMOSTAT POWDER SURGIFOAM 1G (HEMOSTASIS) ×3 IMPLANT
INSERT FOGARTY XLG (MISCELLANEOUS) ×1 IMPLANT
KIT BASIN OR (CUSTOM PROCEDURE TRAY) ×2 IMPLANT
KIT DILATOR VASC 18G NDL (KITS) ×1 IMPLANT
KIT DRAINAGE VACCUM ASSIST (KITS) ×1 IMPLANT
KIT SUCTION CATH 14FR (SUCTIONS) ×4 IMPLANT
KIT TURNOVER KIT B (KITS) ×2 IMPLANT
LINE VENT (MISCELLANEOUS) ×1 IMPLANT
NS IRRIG 1000ML POUR BTL (IV SOLUTION) ×7 IMPLANT
PACK CHEST (CUSTOM PROCEDURE TRAY) ×1 IMPLANT
PACK OPEN HEART (CUSTOM PROCEDURE TRAY) ×1 IMPLANT
PAD ARMBOARD 7.5X6 YLW CONV (MISCELLANEOUS) ×4 IMPLANT
PAD ELECT DEFIB RADIOL ZOLL (MISCELLANEOUS) ×2 IMPLANT
PERCLOSE PROGLIDE 6F (VASCULAR PRODUCTS) ×4
SET CARDIOPLEGIA MPS 5001102 (MISCELLANEOUS) ×1 IMPLANT
SHEATH PINNACLE 8F 10CM (SHEATH) ×1 IMPLANT
SPONGE LAP 18X18 X RAY DECT (DISPOSABLE) ×2 IMPLANT
SPONGE LAP 4X18 RFD (DISPOSABLE) ×1 IMPLANT
STRIP CLOSURE SKIN 1/2X4 (GAUZE/BANDAGES/DRESSINGS) ×2 IMPLANT
SUT ETHIBOND 2 0 SH (SUTURE) ×2
SUT ETHIBOND 2 0 SH 36X2 (SUTURE) IMPLANT
SUT ETHIBOND NAB MH 2-0 36IN (SUTURE) ×1 IMPLANT
SUT ETHIBOND X763 2 0 SH 1 (SUTURE) ×4 IMPLANT
SUT MNCRL AB 3-0 PS2 18 (SUTURE) ×4 IMPLANT
SUT PDS AB 1 CTX 36 (SUTURE) ×4 IMPLANT
SUT PROLENE 3 0 SH DA (SUTURE) ×7 IMPLANT
SUT PROLENE 3 0 SH1 36 (SUTURE) ×1 IMPLANT
SUT PROLENE 4 0 RB 1 (SUTURE) ×4
SUT PROLENE 4 0 SH DA (SUTURE) ×1 IMPLANT
SUT PROLENE 4-0 RB1 .5 CRCL 36 (SUTURE) IMPLANT
SUT PROLENE 5 0 C 1 36 (SUTURE) ×2 IMPLANT
SUT PROLENE 6 0 C 1 30 (SUTURE) ×2 IMPLANT
SUT SILK  1 MH (SUTURE) ×6
SUT SILK 1 MH (SUTURE) IMPLANT
SUT SILK 1 TIES 10X30 (SUTURE) ×1 IMPLANT
SUT VIC AB 2-0 CT1 36 (SUTURE) ×2 IMPLANT
SUT VIC AB 2-0 CTX 36 (SUTURE) ×2 IMPLANT
SWAB COLLECTION DEVICE MRSA (MISCELLANEOUS) IMPLANT
SWAB CULTURE ESWAB REG 1ML (MISCELLANEOUS) IMPLANT
SYR 50ML SLIP (SYRINGE) IMPLANT
SYSTEM SAHARA CHEST DRAIN ATS (WOUND CARE) IMPLANT
TAPE CLOTH SURG 4X10 WHT LF (GAUZE/BANDAGES/DRESSINGS) ×1 IMPLANT
TAPE PAPER 2X10 WHT MICROPORE (GAUZE/BANDAGES/DRESSINGS) ×1 IMPLANT
TOWEL GREEN STERILE (TOWEL DISPOSABLE) ×2 IMPLANT
TOWEL GREEN STERILE FF (TOWEL DISPOSABLE) ×2 IMPLANT
TRAP SPECIMEN MUCOUS 40CC (MISCELLANEOUS) IMPLANT
TRAY FOLEY MTR SLVR 14FR STAT (SET/KITS/TRAYS/PACK) ×2 IMPLANT
TUBE SUCT INTRACARD DLP 20F (MISCELLANEOUS) ×1 IMPLANT
WATER STERILE IRR 1000ML POUR (IV SOLUTION) ×4 IMPLANT
WIRE EMERALD 3MM-J .035X150CM (WIRE) ×1 IMPLANT

## 2019-09-25 NOTE — ED Triage Notes (Signed)
Pt came in [place], wife found husband on couch unresponsive, wife started CPR. EMS arrived, STEMI on EKG, unresponsive with pulse. Bagged here.   60/40 BP, HR90

## 2019-09-25 NOTE — Progress Notes (Signed)
Patient transported to CT and back with no complications.

## 2019-09-25 NOTE — Progress Notes (Signed)
Echocardiogram Echocardiogram Transesophageal has been performed.  Oneal Deputy Damiah Mcdonald 09/25/2019, 8:16 AM

## 2019-09-25 NOTE — ED Notes (Signed)
[facility] called to activate code stemi per Dr. Roxanne Mins

## 2019-09-25 NOTE — Transfer of Care (Signed)
Immediate Anesthesia Transfer of Care Note  Patient: TUG REUM  Procedure(s) Performed: REDO STERNOTOMY (N/A Chest) Transesophageal Echocardiogram (Tee) (N/A Chest) Evacuation Hematoma - Mediastinal Ligation of saphenous vein graft x two with Cardiopulmonary Bypass (N/A Chest)  Patient Location: ICU  Anesthesia Type:General  Level of Consciousness: Patient remains intubated per anesthesia plan  Airway & Oxygen Therapy: Patient remains intubated per anesthesia plan and Patient placed on Ventilator (see vital sign flow sheet for setting)  Post-op Assessment: Report given to RN and Post -op Vital signs reviewed and stable  Post vital signs: Reviewed and stable  Last Vitals:  Vitals Value Taken Time  BP 101/63 09/25/19 0631  Temp    Pulse 64 09/25/19 0633  Resp 16 09/25/19 0635  SpO2 100 % 09/25/19 0633  Vitals shown include unvalidated device data.  Last Pain: There were no vitals filed for this visit.     Rt at bedside, applied to vent, PRVC mode. VSS. Report to RN at bedside, verbalized current infusions. Patient left in stable condition.  Complications: No apparent anesthesia complications

## 2019-09-25 NOTE — ED Provider Notes (Addendum)
[facility] Provider Note   CSN: [id number] Arrival date & time: 09/25/19  0124    History   Chief Complaint Chief Complaint  Patient presents with  . Code STEMI  . unresponsive    HPI Jeffrey Tucker is a 63 y.o. male.   The history is provided by the EMS personnel. The history is limited by the condition of the patient (Altered mental status).  He was brought in by ambulance after he had been found by his wife having collapsed at home.  Wife did perform CPR.  EMS relates the patient has been obtunded and having periods of apnea.  He recently had aortic valve surgery and bypass surgery.  No other history is immediately available.  ECG in the ambulance did show STEMI.  No past medical history on file.  There are no active problems to display for this patient.   ** The histories are not reviewed yet. Please review them in the "History" navigator section and refresh this [place].      Home Medications    Prior to Admission medications   Not on File    Family History No family history on file.  Social History Social History   Tobacco Use  . Smoking status: Not on file  Substance Use Topics  . Alcohol use: Not on file  . Drug use: Not on file     Allergies   Patient has no allergy information on record.   Review of Systems Review of Systems  Unable to perform ROS: Mental status change     Physical Exam Updated Vital Signs BP (!) 123/111 (BP Location: Left Arm)   Pulse 95   Resp (!) 26   Ht 5\' 8"  (1.727 m)   Wt 65.8 kg   BMI 22.05 kg/m   Physical Exam Vitals signs and nursing note reviewed.    63 year old male, moaning and not responding to verbal or painful stimuli. Vital signs are significant for low blood pressure and rapid respiratory rate. Oxygen saturation is 100%, which is normal. Head is normocephalic and atraumatic. PERRLA, EOMI. Oropharynx is clear. Neck is nontender and supple without  adenopathy or JVD. Back is nontender and there is no CVA tenderness. Lungs are clear without rales, wheezes, or rhonchi. Chest is nontender. Heart has regular rate and rhythm without murmur. Abdomen is soft, flat, nontender without masses or hepatosplenomegaly and peristalsis is normoactive. Extremities have no cyanosis or edema, full range of motion is present. Skin is warm and dry without rash. Neurologic: Mental status is as noted above.  He moves all extremities equally.  ED Treatments / Results  Labs (all labs ordered are listed, but only abnormal results are displayed) Labs Reviewed  CBC - Abnormal; Notable for the following components:      Result Value   WBC 13.1 (*)    RBC 3.05 (*)    Hemoglobin 8.8 (*)    HCT 29.5 (*)    MCHC 29.8 (*)    RDW 17.4 (*)    Platelets 523 (*)    All other components within normal limits  SARS CORONAVIRUS 2 (TAT 6-24 HRS)  BASIC METABOLIC PANEL  TYPE AND SCREEN  ABO/RH  PREPARE RBC (CROSSMATCH)  TROPONIN I (HIGH SENSITIVITY)    EKG EKG Interpretation  Date/Time:  Friday September 25 2019 01:29:44 EDT Ventricular Rate:  94 PR Interval:    QRS Duration: 110 QT Interval:  356 QTC Calculation: 446 R Axis:   55  Text Interpretation: Sinus or ectopic atrial rhythm Abnormal R-wave progression, early transition Inferior infarct, acute (RCA) Probable RV involvement, suggest recording right precordial leads >>> Acute MI <<< When compared with ECG of 09/23/2019, ** ** ACUTE MI / STEMI ** ** is now present QT has shortened Confirmed by Delora Fuel ([id number]) on 09/25/2019 1:54:49 AM   Radiology Dg Chest Portable 1 View  Result Date: 09/25/2019 CLINICAL DATA:  Status post intubation EXAM: PORTABLE CHEST 1 VIEW COMPARISON:  09/16/2019 FINDINGS: Endotracheal tube is noted at the level of the aortic knob. Gastric catheter extends into the stomach the cardiac shadow remains enlarged. Postsurgical changes are noted. Right lung is clear and stable. Small  left pleural effusion is noted with underlying consolidation stable from the prior exam. No bony abnormality is noted. IMPRESSION: Left pleural effusion and underlying consolidation stable from the prior exam. Endotracheal tube and gastric catheter in satisfactory position. Electronically Signed   By: Inez Catalina M.D.   On: 09/25/2019 02:09   Ct Angio Chest/abd/pel For Dissection W And/or Wo Contrast  Result Date: 09/25/2019 CLINICAL DATA:  Found unresponsive EXAM: CT ANGIOGRAPHY CHEST, ABDOMEN AND PELVIS TECHNIQUE: Multidetector CT imaging through the chest, abdomen and pelvis was performed using the standard protocol during bolus administration of intravenous contrast. Multiplanar reconstructed images and MIPs were obtained and reviewed to evaluate the vascular anatomy. CONTRAST:  191mL OMNIPAQUE IOHEXOL 350 MG/ML SOLN COMPARISON:  09/16/2019, 01/29/2019 FINDINGS: CTA CHEST FINDINGS Cardiovascular: There are changes consistent with focal extravasation from the root of the aorta along the right lateral border of the ascending aorta. A focal area of enhancement is noted which measures 3.5 cm in greatest dimension consistent with the active extravasation from the aorta. This arises adjacent to a diminutive reimplanted coronary artery. The more distal ascending aorta appears within normal limits with normal tapering. The descending aorta is tortuous with atherosclerotic calcifications although no other focal aortic abnormality is seen. Enlarging pericardial effusion is seen when compared with the prior exam. Postsurgical changes consistent with ascending aorta reconstruction are seen. The pulmonary artery is significantly displaced by the enlarging pericardial effusion. Filling defect is noted within the right upper lobe pulmonary artery consistent with pulmonary embolus. No other focal emboli are noted. Mediastinum/Nodes: Gastric catheter is noted within the esophagus. Endotracheal tube is noted in satisfactory  position. The thoracic inlet is within normal limits. A portion of the density surrounding the aorta may represent some mediastinal hematoma as well. No significant lymphadenopathy is noted. Lungs/Pleura: Right lung is well aerated. A small focal area of pleural based density is noted which may be related to mild pulmonary infarct given the associated pulmonary embolus. Patchy atelectatic changes are noted in the right lung base as well. Consolidation of the left lower lobe with small effusion is seen. Some atelectatic changes in the left upper lobe are noted as well. Musculoskeletal: Degenerative changes of the thoracic spine are noted. No acute bony abnormality is seen. No acute rib abnormality is noted. Review of the MIP images confirms the above findings. CTA ABDOMEN AND PELVIS FINDINGS VASCULAR Aorta: The abdominal aorta is well visualized with atherosclerotic calcifications. No aneurysmal dilatation or dissection is seen. Celiac: Patent without evidence of aneurysm, dissection, vasculitis or significant stenosis. SMA: Patent without evidence of aneurysm, dissection, vasculitis or significant stenosis. Renals: Single renal arteries are noted bilaterally. IMA: The IMA is visualized but poorly opacified which may be related to stenosis at the origin. Iliacs: Iliac arteries are widely patent with mild atherosclerotic change. There  is a focal 2.6 cm area of slight enhancement adjacent to the right common femoral artery likely representing a small pseudoaneurysm. This was not present on a prior exam from 01/29/2019. Correlate with any recent intervention in the right common femoral artery. Veins: No specific venous abnormality is noted. Review of the MIP images confirms the above findings. NON-VASCULAR Hepatobiliary: Liver is within normal limits. Reflux into the hepatic veins is noted related to decreased cardiac output. Gallbladder has been surgically removed. Pancreas: Unremarkable. No pancreatic ductal  dilatation or surrounding inflammatory changes. Spleen: Normal in size without focal abnormality. Adrenals/Urinary Tract: Adrenal glands are within normal limits. Large cyst is again noted in the right kidney stable from prior exams. No renal calculi or obstructive changes are seen. The bladder is partially distended. Stomach/Bowel: Colon demonstrates mild fluid within. No obstructive or inflammatory changes of the large or small bowel are noted. The appendix is within normal limits. The stomach is unremarkable. Lymphatic: No significant lymphadenopathy is noted. Reproductive: Prostate is unremarkable. Other: No abdominal wall hernia or abnormality. No abdominopelvic ascites. Musculoskeletal: No acute or significant osseous findings.A bone island is noted in the right iliac bone. Review of the MIP images confirms the above findings. IMPRESSION: Changes consistent with active aortic extravasation with enlarging pericardial effusion. The extravasation occurs adjacent to the origin of a reimplanted coronary artery which is somewhat diminutive on the current exam. It previously coursed along the posterior interventricular septum. This may represent breakdown of the reimplantation site. Right upper lobe pulmonary arterial emboli new from the prior study. Left lower lobe consolidation with smaller areas of atelectasis in the right lower lobe and left upper lobe. Pseudoaneurysm arising from the right common femoral artery. This is new from a prior exam from March of 2020. Correlate with any recent intervention in the right common femoral artery. Critical Value/emergent results were called by telephone at the time of interpretation on 09/25/2019 at 2:24 am to Dr. Delora Fuel , who verbally acknowledged these results. Electronically Signed   By: Inez Catalina M.D.   On: 09/25/2019 02:43    Procedures Procedure Name: Intubation Date/Time: 09/25/2019 2:03 AM Performed by: Delora Fuel, MD Pre-anesthesia Checklist:  Patient identified, Patient being monitored, Emergency Drugs available, Timeout performed and Suction available Oxygen Delivery Method: Non-rebreather mask Preoxygenation: Pre-oxygenation with 100% oxygen Induction Type: Rapid sequence Ventilation: Mask ventilation without difficulty Laryngoscope Size: Glidescope and 3 Grade View: Grade I Tube size: 7.5 mm Number of attempts: 1 Airway Equipment and Method: Rigid stylet and Video-laryngoscopy Placement Confirmation: ETT inserted through vocal cords under direct vision,  CO2 detector and Breath sounds checked- equal and bilateral Secured at: 25 cm Tube secured with: ETT holder Comments: Post intubation x-ray shows adequate position of endotracheal tube and orogastric tube.       CRITICAL CARE Performed by: Delora Fuel Total critical care time: 85 minutes Critical care time was exclusive of separately billable procedures and treating other patients. Critical care was necessary to treat or prevent imminent or life-threatening deterioration. Critical care was time spent personally by me on the following activities: development of treatment plan with patient and/or surrogate as well as nursing, discussions with consultants, evaluation of patient's response to treatment, examination of patient, obtaining history from patient or surrogate, ordering and performing treatments and interventions, ordering and review of laboratory studies, ordering and review of radiographic studies, pulse oximetry and re-evaluation of patient's condition.  Medications Ordered in ED Medications  sodium chloride flush (NS) 0.9 % injection 3 mL (has  no administration in time range)  midazolam (VERSED) injection 5 mg (has no administration in time range)  norepinephrine (LEVOPHED) 4mg  in 218mL premix infusion (25 mcg/min Intravenous Rate/Dose Change 09/25/19 0206)  heparin injection 4,000 Units (has no administration in time range)  dexmedetomidine (PRECEDEX) 400  MCG/100ML (4 mcg/mL) infusion (has no administration in time range)  insulin regular, human (MYXREDLIN) 100 units/ 100 mL infusion (has no administration in time range)  EPINEPHrine (ADRENALIN) 4 mg in NS 250 mL (0.016 mg/mL) premix infusion (has no administration in time range)  DOPamine (INTROPIN) 800 mg in dextrose 5 % 250 mL (3.2 mg/mL) infusion (has no administration in time range)  milrinone (PRIMACOR) 20 MG/100 ML (0.2 mg/mL) infusion (has no administration in time range)  nitroGLYCERIN 50 mg in dextrose 5 % 250 mL (0.2 mg/mL) infusion (has no administration in time range)  phenylephrine (NEOSYNEPHRINE) 20-0.9 MG/250ML-% infusion (has no administration in time range)  magnesium sulfate (IV Push/IM) injection 40 mEq (has no administration in time range)  potassium chloride injection 80 mEq (has no administration in time range)  heparin 30,000 units/NS 1000 mL solution for CELLSAVER (has no administration in time range)  heparin 2,500 Units, papaverine 30 mg in electrolyte-148 (PLASMALYTE-148) 500 mL irrigation (has no administration in time range)  tranexamic acid (CYKLOKAPRON) pump prime solution 132 mg (has no administration in time range)  tranexamic acid (CYKLOKAPRON) bolus via infusion - over 30 minutes 987 mg (has no administration in time range)  tranexamic acid (CYKLOKAPRON) 2,500 mg in sodium chloride 0.9 % 250 mL (10 mg/mL) infusion (has no administration in time range)  vancomycin (VANCOCIN) 1,250 mg in sodium chloride 0.9 % 250 mL IVPB (has no administration in time range)  cefUROXime (ZINACEF) 1.5 g in sodium chloride 0.9 % 100 mL IVPB (has no administration in time range)  cefUROXime (ZINACEF) 750 mg in sodium chloride 0.9 % 100 mL IVPB (has no administration in time range)  etomidate (AMIDATE) injection (20 mg Intravenous Given 09/25/19 0133)  succinylcholine (ANECTINE) injection (100 mg Intravenous Given 09/25/19 0134)  fentaNYL (SUBLIMAZE) injection 100 mcg (100 mcg  Intravenous Given 09/25/19 0221)  aspirin suppository 300 mg (300 mg Rectal Given 09/25/19 0221)  iohexol (OMNIPAQUE) 350 MG/ML injection 100 mL (100 mLs Intravenous Contrast Given 09/25/19 0216)     Initial Impression / Assessment and Plan / ED Course  I have reviewed the triage vital signs and the nursing notes.  Pertinent labs & imaging results that were available during my care of the patient were reviewed by me and considered in my medical decision making (see chart for details).   Inferior wall STEMI in setting of recent cardiac surgery including aortic valve replacement and ascending aortic aneurysm repair as well as two-vessel bypass.  Old records were reviewed confirming above-noted procedure.  Coronary arteries receiving bypass grafts included obtuse marginal and posterior descending artery.  Catheterization had showed 80% lesions in proximal and mid circumflex, 70% lesion posterior descending artery, 70% lesion distal RCA.  Patient was promptly intubated.  Case has been discussed with Dr. Roxy Manns of cardiothoracic surgery who requests emergent CT angiogram be obtained to rule out dissection and, if negative, proceed to Cath Lab.  Case is also been discussed with Dr. Angelena Form of cardiology who is coming in to assist with his care.  Because of concern about possible dissection, aspirin and heparin are not given.  CT angiogram shows large pericardial effusion with probable ongoing extravasation from the aortic root -this was discussed directly with radiologist.  Dr. Roxy Manns and Dr. Julianne Handler are here and patient is being taken directly to the catheterization lab.  Final CT angiogram report has additional diagnoses of pulmonary emboli in the right upper lobe and pseudoaneurysm of the right common femoral artery.  Final Clinical Impressions(s) / ED Diagnoses   Final diagnoses:  Nontraumatic cardiac tamponade  Anemia due to blood loss    ED Discharge Orders    None       Delora Fuel, MD  [id number]    Delora Fuel, MD [id number] [phone]

## 2019-09-25 NOTE — Anesthesia Procedure Notes (Signed)
Central Venous Catheter Insertion Performed by: Oleta Mouse, MD Start/End10/30/2020 3:07 AM, 09/25/2019 3:17 AM Patient location: OR. Preanesthetic checklist: patient identified, IV checked, site marked, risks and benefits discussed, surgical consent, monitors and equipment checked, pre-op evaluation, timeout performed and anesthesia consent Lidocaine 1% used for infiltration and patient sedated Hand hygiene performed  and maximum sterile barriers used  Catheter size: 9 Fr Total catheter length 10. MAC introducer Procedure performed using ultrasound guided technique. Ultrasound Notes:anatomy identified, needle tip was noted to be adjacent to the nerve/plexus identified, no ultrasound evidence of intravascular and/or intraneural injection and image(s) printed for medical record Attempts: 1 Following insertion, line sutured, dressing applied and Biopatch. Post procedure assessment: blood return through all ports, free fluid flow and no air  Patient tolerated the procedure well with no immediate complications.

## 2019-09-25 NOTE — Brief Op Note (Addendum)
09/25/2019  5:35 AM  PATIENT:  Jeffrey Tucker  63 y.o. male  PRE-OPERATIVE DIAGNOSIS:  AORTIC EXTRAVASATION with ENLARGING PERICARDIAL EFFUSION  POST-OPERATIVE DIAGNOSIS:   AORTIC EXTRAVASATION with ENLARGING PERICARDIAL EFFUSION  PROCEDURE:  Transesophageal Echocardiogram ([place], EMERGENT REDO STERNOTOMY   SURGEON:  Surgeon(s) and Role:    Rexene Alberts, MD - Primary  PHYSICIAN ASSISTANT: Lars Pinks PA-C  ASSISTANTS: Bascom Levels RNFA  ANESTHESIA:   general  EBL:  Per perfusion and anesthesia record  > 3 liters  BLOOD ADMINISTERED:8 CC PRBC, 6 FFP and 1 PLTS  DRAINS: 2 Blake drains placed in the pleural spaces and a 36 French chest tube placed in the mediastihal space   COUNTS CORRECT:  YES  DICTATION: .Dragon Dictation  PLAN OF CARE: Admit to inpatient   PATIENT DISPOSITION:  ICU - intubated and hemodynamically stable.   Delay start of Pharmacological VTE agent (>24hrs) due to surgical blood loss or risk of bleeding: yes

## 2019-09-25 NOTE — Progress Notes (Signed)
[facility] DAILY ICU PROGRESS NOTE                   [address]            ,[place] [id number]          [phone]   Day of Surgery Procedure(s) (LRB): REDO STERNOTOMY (N/A) Transesophageal Echocardiogram (Tee) (N/A) Evacuation Hematoma - Mediastinal Ligation of saphenous vein graft x two with Cardiopulmonary Bypass (N/A)  Total Length of Stay:  LOS: 0 days   Subjective: Hypothermic- BH now in place Low cardcac index, around 1.0- on levophed with MAP in 70's CT drainage is significantly slowed Objective: Vital signs in last 24 hours: Temp:  [95.7 F (35.4 C)] 95.7 F (35.4 C) (10/30 0800) Pulse Rate:  [59-95] 71 (10/30 0808) Cardiac Rhythm: Normal sinus rhythm (10/30 0800) Resp:  [16-38] 16 (10/30 0808) BP: (75-123)/(54-111) 92/62 (10/30 0808) SpO2:  [92 %-100 %] 100 % (10/30 0808) Arterial Line BP: (106-123)/(71-75) 106/73 (10/30 0800) FiO2 (%):  [50 %-100 %] 50 % (10/30 0808) Weight:  [65.8 kg] 65.8 kg (10/30 0144)  Filed Weights   09/25/19 0144  Weight: 65.8 kg    Weight change:    Hemodynamic parameters for last 24 hours: CVP:  [8 mmHg] 8 mmHg  Intake/Output from previous day: 10/29 0701 - 10/30 0700 In: 7333 [I.V.:2700; Blood:4633] Out: 225 [Urine:225]  Intake/Output this shift: Total I/O In: -  Out: 498 [Urine:75; Chest Tube:423]  Current Meds: Scheduled Meds:  [START ON 09/26/2019] acetaminophen  1,000 mg Oral Q6H   Or   [START ON 09/26/2019] acetaminophen (TYLENOL) oral liquid 160 mg/5 mL  1,000 mg Per Tube Q6H   acetaminophen (TYLENOL) oral liquid 160 mg/5 mL  650 mg Per Tube Once   Or   acetaminophen  650 mg Rectal Once   [START ON 09/26/2019] aspirin EC  325 mg Oral Daily   Or   [START ON 09/26/2019] aspirin  324 mg Per Tube Daily   [START ON 09/26/2019] bisacodyl  10 mg Oral Daily   Or   [START ON 09/26/2019] bisacodyl  10 mg Rectal Daily   chlorhexidine  15 mL Mouth/Throat NOW   [START ON 09/26/2019] docusate  sodium  200 mg Oral Daily   insulin regular  0-10 Units Intravenous TID WC   [START ON 09/27/2019] pantoprazole  40 mg Oral Daily   [START ON 09/26/2019] sodium chloride flush  3 mL Intravenous Q12H   Continuous Infusions:  sodium chloride     sodium chloride 100 mL/hr at 09/25/19 0728   [START ON 09/26/2019] sodium chloride     sodium chloride     cefUROXime (ZINACEF)  IV     dexmedetomidine (PRECEDEX) IV infusion 0.5 mcg/kg/hr (09/25/19 0625)   famotidine (PEPCID) IV 20 mg (09/25/19 0644)   insulin 1.2 Units/hr (09/25/19 0625)   lactated ringers     lactated ringers 150 mL/hr at 09/25/19 0625   lactated ringers     magnesium sulfate 4 g (09/25/19 0654)   nitroGLYCERIN     norepinephrine (LEVOPHED) Adult infusion 17 mcg/min (09/25/19 0625)   phenylephrine (NEO-SYNEPHRINE) Adult infusion     potassium chloride 10 mEq (09/25/19 0842)   vancomycin     PRN Meds:.sodium chloride, lactated ringers, metoprolol tartrate, midazolam, morphine injection, ondansetron (ZOFRAN) IV, oxyCODONE, [START ON 09/26/2019] sodium chloride flush, traMADol  General appearance: sedated on vent Neurologic: follows simple commands, MAE x4 Heart: RRR Lungs: dim in lower fields Abdomen: soft Extremities: no  edema Wound: dressings CDI  Lab Results: CBC: Recent Labs    09/25/19 0141  09/25/19 0519 09/25/19 0553  WBC 13.1*  --   --   --   HGB 8.8*   < > 8.8* 10.2*  HCT 29.5*   < > 26.0* 30.0*  PLT 523*  --   --   --    < > = values in this interval not displayed.   BMET:  Recent Labs    09/25/19 0141 09/25/19 0331  09/25/19 0519 09/25/19 0553  NA 134* 136   136   < > 140 143  K 3.6 3.6   3.6   < > 3.4* 3.4*  CL 102 107  --  105  --   CO2 17*  --   --   --   --   GLUCOSE 199* 215*  --  192*  --   BUN 9 9  --  9  --   CREATININE 1.15 0.90  --  0.80  --   CALCIUM 8.5*  --   --   --   --    < > = values in this interval not displayed.    CMET: Lab Results  Component  Value Date   WBC 13.1 (H) 09/25/2019   HGB 10.2 (L) 09/25/2019   HCT 30.0 (L) 09/25/2019   PLT 523 (H) 09/25/2019   GLUCOSE 192 (H) 09/25/2019   NA 143 09/25/2019   K 3.4 (L) 09/25/2019   CL 105 09/25/2019   CREATININE 0.80 09/25/2019   BUN 9 09/25/2019   CO2 17 (L) 09/25/2019   INR 1.8 (H) 09/25/2019      PT/INR:  Recent Labs    09/25/19 0634  LABPROT 21.0*  INR 1.8*   Radiology: Dg Chest Port 1 View  Result Date: 09/25/2019 CLINICAL DATA:  Intubation.  Cardiac surgery. EXAM: PORTABLE CHEST 1 VIEW COMPARISON:  CT 09/25/2019.  Chest x-ray 09/25/2019. FINDINGS: Endotracheal tube tip 1.3 cm above the lower portion of the carina, proximal repositioning of approximately 2 cm should be considered. NG tube tip below left hemidiaphragm. Mediastinal drainage catheter noted over the mid chest. Pericardial drainage catheter noted over the left lower chest. Bilateral chest tubes are noted. No pneumothorax. Prior median sternotomy and cardiac valve replacement. Surgical clips right upper chest. Stable cardiomegaly. No pulmonary venous congestion. Low lung volumes with basilar atelectasis. Small left pleural effusion cannot be excluded. Mild right chest wall subcutaneous emphysema. IMPRESSION: 1. Endotracheal tube tip 1.3 cm above the lower portion of the carina, proximal repositioning of approximately 2 cm should be considered. 2. NG tube tip below left hemidiaphragm. Mediastinal drainage catheter noted over the mid chest. Pericardial change catheter noted over the left lower chest. Bilateral chest tubes are noted. No pneumothorax. 3. Prior median sternotomy and cardiac valve replacement. Stable cardiomegaly. No pulmonary venous congestion. 4. Low lung volumes with basilar atelectasis. Small left pleural effusion cannot be excluded. Electronically Signed   By: Marcello Moores  Register   On: 09/25/2019 07:01   Dg Chest Portable 1 View  Result Date: 09/25/2019 CLINICAL DATA:  Status post intubation EXAM:  PORTABLE CHEST 1 VIEW COMPARISON:  09/16/2019 FINDINGS: Endotracheal tube is noted at the level of the aortic knob. Gastric catheter extends into the stomach the cardiac shadow remains enlarged. Postsurgical changes are noted. Right lung is clear and stable. Small left pleural effusion is noted with underlying consolidation stable from the prior exam. No bony abnormality is noted. IMPRESSION: Left pleural effusion and  underlying consolidation stable from the prior exam. Endotracheal tube and gastric catheter in satisfactory position. Electronically Signed   By: Inez Catalina M.D.   On: 09/25/2019 02:09   Ct Angio Chest/abd/pel For Dissection W And/or Wo Contrast  Result Date: 09/25/2019 CLINICAL DATA:  Found unresponsive EXAM: CT ANGIOGRAPHY CHEST, ABDOMEN AND PELVIS TECHNIQUE: Multidetector CT imaging through the chest, abdomen and pelvis was performed using the standard protocol during bolus administration of intravenous contrast. Multiplanar reconstructed images and MIPs were obtained and reviewed to evaluate the vascular anatomy. CONTRAST:  170mL OMNIPAQUE IOHEXOL 350 MG/ML SOLN COMPARISON:  09/16/2019, 01/29/2019 FINDINGS: CTA CHEST FINDINGS Cardiovascular: There are changes consistent with focal extravasation from the root of the aorta along the right lateral border of the ascending aorta. A focal area of enhancement is noted which measures 3.5 cm in greatest dimension consistent with the active extravasation from the aorta. This arises adjacent to a diminutive reimplanted coronary artery. The more distal ascending aorta appears within normal limits with normal tapering. The descending aorta is tortuous with atherosclerotic calcifications although no other focal aortic abnormality is seen. Enlarging pericardial effusion is seen when compared with the prior exam. Postsurgical changes consistent with ascending aorta reconstruction are seen. The pulmonary artery is significantly displaced by the enlarging  pericardial effusion. Filling defect is noted within the right upper lobe pulmonary artery consistent with pulmonary embolus. No other focal emboli are noted. Mediastinum/Nodes: Gastric catheter is noted within the esophagus. Endotracheal tube is noted in satisfactory position. The thoracic inlet is within normal limits. A portion of the density surrounding the aorta may represent some mediastinal hematoma as well. No significant lymphadenopathy is noted. Lungs/Pleura: Right lung is well aerated. A small focal area of pleural based density is noted which may be related to mild pulmonary infarct given the associated pulmonary embolus. Patchy atelectatic changes are noted in the right lung base as well. Consolidation of the left lower lobe with small effusion is seen. Some atelectatic changes in the left upper lobe are noted as well. Musculoskeletal: Degenerative changes of the thoracic spine are noted. No acute bony abnormality is seen. No acute rib abnormality is noted. Review of the MIP images confirms the above findings. CTA ABDOMEN AND PELVIS FINDINGS VASCULAR Aorta: The abdominal aorta is well visualized with atherosclerotic calcifications. No aneurysmal dilatation or dissection is seen. Celiac: Patent without evidence of aneurysm, dissection, vasculitis or significant stenosis. SMA: Patent without evidence of aneurysm, dissection, vasculitis or significant stenosis. Renals: Single renal arteries are noted bilaterally. IMA: The IMA is visualized but poorly opacified which may be related to stenosis at the origin. Iliacs: Iliac arteries are widely patent with mild atherosclerotic change. There is a focal 2.6 cm area of slight enhancement adjacent to the right common femoral artery likely representing a small pseudoaneurysm. This was not present on a prior exam from 01/29/2019. Correlate with any recent intervention in the right common femoral artery. Veins: No specific venous abnormality is noted. Review of the  MIP images confirms the above findings. NON-VASCULAR Hepatobiliary: Liver is within normal limits. Reflux into the hepatic veins is noted related to decreased cardiac output. Gallbladder has been surgically removed. Pancreas: Unremarkable. No pancreatic ductal dilatation or surrounding inflammatory changes. Spleen: Normal in size without focal abnormality. Adrenals/Urinary Tract: Adrenal glands are within normal limits. Large cyst is again noted in the right kidney stable from prior exams. No renal calculi or obstructive changes are seen. The bladder is partially distended. Stomach/Bowel: Colon demonstrates mild fluid within. No  obstructive or inflammatory changes of the large or small bowel are noted. The appendix is within normal limits. The stomach is unremarkable. Lymphatic: No significant lymphadenopathy is noted. Reproductive: Prostate is unremarkable. Other: No abdominal wall hernia or abnormality. No abdominopelvic ascites. Musculoskeletal: No acute or significant osseous findings.A bone island is noted in the right iliac bone. Review of the MIP images confirms the above findings. IMPRESSION: Changes consistent with active aortic extravasation with enlarging pericardial effusion. The extravasation occurs adjacent to the origin of a reimplanted coronary artery which is somewhat diminutive on the current exam. It previously coursed along the posterior interventricular septum. This may represent breakdown of the reimplantation site. Right upper lobe pulmonary arterial emboli new from the prior study. Left lower lobe consolidation with smaller areas of atelectasis in the right lower lobe and left upper lobe. Pseudoaneurysm arising from the right common femoral artery. This is new from a prior exam from March of 2020. Correlate with any recent intervention in the right common femoral artery. Critical Value/emergent results were called by telephone at the time of interpretation on 09/25/2019 at 2:24 am to Dr.  Delora Fuel , who verbally acknowledged these results. Electronically Signed   By: Inez Catalina M.D.   On: 09/25/2019 02:43     Assessment/Plan: S/P Procedure(s) (LRB): REDO STERNOTOMY (N/A) Transesophageal Echocardiogram (Tee) (N/A) Evacuation Hematoma - Mediastinal Ligation of saphenous vein graft x two with Cardiopulmonary Bypass (N/A)  1 relatively stable overall 2 low cardiac index with elev SVR- titrate norepi, may need additional agent. Receiving albumin and volume. Was acidotic in OR- ABG looks good currently 3 bleeding appears controlled- cont CT's. ABL anemia has stabilized. INR and PTT a bit elevated - does not require products clinically currently 4 monitor thrombocytopenia closely 5 Warm patient with bare hugger 6 BUN /cr are normal, monitor closely, fair UOP 7 insulin management per routine 8 full vent management till more hemodynamically stable  John Giovanni PA-C 09/25/2019 9:03 AM  Pager [phone] not for patient use

## 2019-09-25 NOTE — Progress Notes (Signed)
Patient transported to OR without complications.

## 2019-09-25 NOTE — Progress Notes (Signed)
EVENING ROUNDS NOTE :     [address]       [place] [address]             [phone]                 Day of Surgery Procedure(s) (LRB): REDO STERNOTOMY (N/A) Transesophageal Echocardiogram (Tee) (N/A) Evacuation Hematoma - Mediastinal Ligation of saphenous vein graft x two with Cardiopulmonary Bypass (N/A)   Total Length of Stay:  LOS: 0 days  Events:  No events Extubated CI 2.4 On dopamine    BP 110/78   Pulse 89   Temp 99.3 F (37.4 C)   Resp 18   Ht 5\' 8"  (1.727 m)   Wt 65.8 kg   SpO2 95%   BMI 22.05 kg/m   CVP:  [8 mmHg-11 mmHg] 11 mmHg  Vent Mode: PSV;CPAP FiO2 (%):  [40 %-100 %] 40 % Set Rate:  [4 bmp-16 bmp] 4 bmp Vt Set:  [540 mL-550 mL] 540 mL PEEP:  [5 cmH20] 5 cmH20 Pressure Support:  [10 cmH20] 10 cmH20 Plateau Pressure:  [19 cmH20-26 cmH20] 20 cmH20  . sodium chloride    . sodium chloride 100 mL/hr at 09/25/19 1600  . [START ON 09/26/2019] sodium chloride    . sodium chloride    . cefUROXime (ZINACEF)  IV Stopped (09/25/19 1145)  . dexmedetomidine (PRECEDEX) IV infusion Stopped (09/25/19 0950)  . DOPamine 3 mcg/kg/min (09/25/19 1600)  . lactated ringers    . lactated ringers 20 mL/hr at 09/25/19 1600  . lactated ringers    . nitroGLYCERIN    . norepinephrine (LEVOPHED) Adult infusion Stopped (09/25/19 1229)  . phenylephrine (NEO-SYNEPHRINE) Adult infusion    . potassium chloride 10 mEq (09/25/19 1550)    I/O last 3 completed shifts: In: 7497.2 [I.V.:2837.1; [id number]; IV Piggyback:27.1] Out: 225 [Urine:225]   CBC Latest Ref Rng & Units 09/25/2019 09/25/2019 09/25/2019  WBC 4.0 - 10.5 K/uL - - 11.4(H)  Hemoglobin 13.0 - 17.0 g/dL 8.5(L) 6.1(LL) 9.8(L)  Hematocrit 39.0 - 52.0 % 25.0(L) 18.0(L) 29.3(L)  Platelets 150 - 400 K/uL - - 93(L)    BMP Latest Ref Rng & Units 09/25/2019 09/25/2019 09/25/2019  Glucose 70 - 99 mg/dL - - 106(H)  BUN 8 - 23 mg/dL - - 10  Creatinine 0.61 - 1.24 mg/dL - - 0.98  Sodium 135 -  145 mmol/L 141 157(H) 141  Potassium 3.5 - 5.1 mmol/L 3.5 3.0(L) 3.6  Chloride 98 - 111 mmol/L - - 111  CO2 22 - 32 mmol/L - - 20(L)  Calcium 8.9 - 10.3 mg/dL - - 9.0    ABG    Component Value Date/Time   PHART 7.420 09/25/2019 1419   PCO2ART 29.7 (L) 09/25/2019 1419   PO2ART 73.0 (L) 09/25/2019 1419   HCO3 19.2 (L) 09/25/2019 1419   TCO2 20 (L) 09/25/2019 1419   ACIDBASEDEF 4.0 (H) 09/25/2019 1419   O2SAT 95.0 09/25/2019 [place], MD 09/25/2019 4:41 PM

## 2019-09-25 NOTE — Procedures (Signed)
Extubation Procedure Note  Patient Details:   Name: Jeffrey Tucker DOB: [birth year] MRN: [id number]   Airway Documentation:    Vent end date: 09/25/19 Vent end time: 1245   Evaluation  O2 sats: stable throughout Complications: No apparent complications Patient did tolerate procedure well. Bilateral Breath Sounds: Clear, Diminished   Yes   Patient was extubated to a 4L [place] without any complications, dyspnea or stridor noted. Patient was instructed on IS x 5, highest goal reached was 550mL. Positive cuff leak.   Jensyn Cambria, Eddie North 09/25/2019, 12:45 PM

## 2019-09-25 NOTE — Anesthesia Procedure Notes (Signed)
Arterial Line Insertion Start/End10/30/2020 2:55 AM, 09/25/2019 3:01 AM  Patient location: OR. Preanesthetic checklist: patient identified, IV checked, site marked, risks and benefits discussed, surgical consent, monitors and equipment checked, pre-op evaluation, timeout performed and anesthesia consent Lidocaine 1% used for infiltration Right, radial was placed Catheter size: 20 G Hand hygiene performed  and maximum sterile barriers used   Attempts: 1 Procedure performed without using ultrasound guided technique. Following insertion, dressing applied and Biopatch. Post procedure assessment: normal and unchanged  Patient tolerated the procedure well with no immediate complications.

## 2019-09-25 NOTE — H&P (Signed)
[address]       [place] [address]             [phone]          CARDIOTHORACIC SURGERY HISTORY AND PHYSICAL EXAM  PCP is Venia Carbon, MD Primary Cardiologist is No primary care provider on file.  Reason for consultation:  Pericardial Tamponade  HPI:  Patient is a 63 year old male with history of bicuspid aortic valve, ascending aortic aneurysm, coronary artery disease, hypertension, pulmonary hypertension, former smoker, asthma, hyperlipidemia, hypothyroidism, and BPH who underwent biological Bentall aortic root replacement and coronary artery bypass grafting x2 on August 27, 2019 by Dr. Orvan Seen.  He was discharged from the hospital on the seventh postoperative day.  He was readmitted to the hospital September 16, 2019 with severe chest pain and tachycardia.  CT scan and transthoracic echocardiogram revealed slight increase in the amount of pericardial effusion and/or blood in the mediastinum.  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  During his hospitalization he had a brief episode of atrial fibrillation for which he was treated with amiodarone.  He promptly converted to sinus rhythm and was not started on oral anticoagulation.  On the morning of September 23, 2019 had a brief episode of hypotension which resolved without significant intervention.  He was discharged from the hospital yesterday.  Overnight the patient began to develop bloody drainage from his sternal wound.  According to his wife he otherwise felt well until early this morning she suddenly found him unresponsive.  She moved him to the floor and began CPR.  EMS was called and brought him directly to the emergency room where he was unresponsive on arrival.  EKG revealed diffuse ST segment changes suspicious for inferior wall ischemia.  Both cardiology and cardiothoracic surgical consultations were requested.  At my urging the patient was sent for stat CT angiogram.  Upon my arrival to  the emergency department CT angiogram had just been completed and the patient had been intubated and placed on mechanical ventilation.    Review of systems cannot be obtained.  History reviewed. No pertinent past medical history. in [facility] because patient has incorrect medical record number despite the fact the patient was discharged from this hospital yesterday  History reviewed. No pertinent surgical history. in Epic because patient has incorrect medical record number despite the fact the patient was discharged from this hospital yesterday  History reviewed. No pertinent family history. in [place] because patient has incorrect medical record number despite the fact the patient was discharged from this hospital yesterday  Social History   Socioeconomic History   Marital status: Married    Spouse name: Not on file   Number of children: Not on file   Years of education: Not on file   Highest education level: Not on file  Occupational History   Not on file  Social Needs   Financial resource strain: Not on file   Food insecurity    Worry: Not on file    Inability: Not on file   Transportation needs    Medical: Not on file    Non-medical: Not on file  Tobacco Use   Smoking status: Not on file  Substance and Sexual Activity   Alcohol use: Not on file   Drug use: Not on file   Sexual activity: Not on file  Lifestyle   Physical activity    Days per week: Not on file    Minutes per session: Not  on file   Stress: Not on file  Relationships   Social connections    Talks on phone: Not on file    Gets together: Not on file    Attends religious service: Not on file    Active member of club or organization: Not on file    Attends meetings of clubs or organizations: Not on file    Relationship status: Not on file   Intimate partner violence    Fear of current or ex partner: Not on file    Emotionally abused: Not on file    Physically abused: Not on file    Forced sexual  activity: Not on file  Other Topics Concern   Not on file  Social History Narrative   Not on file    Prior to Admission medications   Not on File    Current Facility-Administered Medications  Medication Dose Route Frequency Provider Last Rate Last Dose   cefUROXime (ZINACEF) 1.5 g in sodium chloride 0.9 % 100 mL IVPB  1.5 g Intravenous To OR Delora Fuel, MD       cefUROXime (ZINACEF) 750 mg in sodium chloride 0.9 % 100 mL IVPB  750 mg Intravenous To OR Delora Fuel, MD       dexmedetomidine (PRECEDEX) 400 MCG/100ML (4 mcg/mL) infusion  0.1-0.7 mcg/kg/hr Intravenous To OR Delora Fuel, MD       DOPamine (INTROPIN) 800 mg in dextrose 5 % 250 mL (3.2 mg/mL) infusion  0-10 mcg/kg/min Intravenous To OR Delora Fuel, MD       EPINEPHrine (ADRENALIN) 4 mg in NS 250 mL (0.016 mg/mL) premix infusion  0-10 mcg/min Intravenous To OR Delora Fuel, MD       heparin 2,500 Units, papaverine 30 mg in electrolyte-148 (PLASMALYTE-148) 500 mL irrigation   Irrigation To OR Delora Fuel, MD       heparin 30,000 units/NS 1000 mL solution for CELLSAVER   Other To OR Delora Fuel, MD       heparin injection 4,000 Units  4,000 Units Intravenous Once Delora Fuel, MD       insulin regular, human (MYXREDLIN) 100 units/ 100 mL infusion   Intravenous To OR Delora Fuel, MD       magnesium sulfate (IV Push/IM) injection 40 mEq  40 mEq Other To OR Delora Fuel, MD       midazolam (VERSED) injection 5 mg  5 mg Intravenous Once Delora Fuel, MD       milrinone (PRIMACOR) 20 MG/100 ML (0.2 mg/mL) infusion  0.3 mcg/kg/min Intravenous To OR Delora Fuel, MD       nitroGLYCERIN 50 mg in dextrose 5 % 250 mL (0.2 mg/mL) infusion  2-200 mcg/min Intravenous To OR Delora Fuel, MD       norepinephrine (LEVOPHED) 4mg  in 246mL premix infusion  0-40 mcg/min Intravenous Continuous Delora Fuel, MD 123456 mL/hr at 09/25/19 0206 25 mcg/min at 09/25/19 0206   phenylephrine (NEOSYNEPHRINE) 20-0.9 MG/250ML-% infusion   30-200 mcg/min Intravenous To OR Delora Fuel, MD       potassium chloride injection 80 mEq  80 mEq Other To OR Delora Fuel, MD       sodium chloride flush (NS) 0.9 % injection 3 mL  3 mL Intravenous Once Delora Fuel, MD       tranexamic acid (CYKLOKAPRON) 2,500 mg in sodium chloride 0.9 % 250 mL (10 mg/mL) infusion  1.5 mg/kg/hr Intravenous To OR Delora Fuel, MD       tranexamic acid (CYKLOKAPRON) bolus via  infusion - over 30 minutes 987 mg  15 mg/kg Intravenous To OR Delora Fuel, MD       tranexamic acid (CYKLOKAPRON) pump prime solution 132 mg  2 mg/kg Intracatheter To OR Delora Fuel, MD       vancomycin (VANCOCIN) 1,250 mg in sodium chloride 0.9 % 250 mL IVPB  1,250 mg Intravenous To OR Delora Fuel, MD       No current outpatient medications on file.  in [place] because patient has incorrect medical record number despite the fact the patient was discharged from this hospital yesterday  Not on File in [place] because patient has incorrect medical record number despite the fact the patient was discharged from this hospital yesterday      Physical Exam:   BP 92/65    Pulse 87    Resp (!) 38    Ht 5\' 8"  (1.727 m)    Wt 65.8 kg    SpO2 93%    BMI 22.05 kg/m   General:  Unresponsive to deep painful stimuli on vent  HEENT:  Unremarkable   Neck:   no JVD, no bruits  Chest:   Coarse but symmetrical  CV:   RRR  Abdomen:  Soft, non-distended  Extremities:  Mottled and cool  Rectal/GU  Deferred  Neuro:   Pupils pinpoint  Skin:   Cool, poor capillary refill  Diagnostic Tests:  Lab Results: Recent Labs    09/25/19 0141  WBC 13.1*  HGB 8.8*  HCT 29.5*  PLT 523*   BMET: No results for input(s): NA, K, CL, CO2, GLUCOSE, BUN, CREATININE, CALCIUM in the last 72 hours.  CBG (last 3)  No results for input(s): GLUCAP in the last 72 hours. PT/INR:  No results for input(s): LABPROT, INR in the last 72 hours.  CXR:  PORTABLE CHEST 1 VIEW  COMPARISON:   09/16/2019  FINDINGS: Endotracheal tube is noted at the level of the aortic knob. Gastric catheter extends into the stomach the cardiac shadow remains enlarged. Postsurgical changes are noted. Right lung is clear and stable. Small left pleural effusion is noted with underlying consolidation stable from the prior exam. No bony abnormality is noted.  IMPRESSION: Left pleural effusion and underlying consolidation stable from the prior exam.  Endotracheal tube and gastric catheter in satisfactory position.   Electronically Signed   By: Inez Catalina M.D.   On: 09/25/2019 02:09   CTA:  CT ANGIOGRAPHY CHEST, ABDOMEN AND PELVIS  TECHNIQUE: Multidetector CT imaging through the chest, abdomen and pelvis was performed using the standard protocol during bolus administration of intravenous contrast. Multiplanar reconstructed images and MIPs were obtained and reviewed to evaluate the vascular anatomy.  CONTRAST:  13mL OMNIPAQUE IOHEXOL 350 MG/ML SOLN  COMPARISON:  09/16/2019, 01/29/2019  FINDINGS: CTA CHEST FINDINGS  Cardiovascular: There are changes consistent with focal extravasation from the root of the aorta along the right lateral border of the ascending aorta. A focal area of enhancement is noted which measures 3.5 cm in greatest dimension consistent with the active extravasation from the aorta. This arises adjacent to a diminutive reimplanted coronary artery. The more distal ascending aorta appears within normal limits with normal tapering. The descending aorta is tortuous with atherosclerotic calcifications although no other focal aortic abnormality is seen. Enlarging pericardial effusion is seen when compared with the prior exam. Postsurgical changes consistent with ascending aorta reconstruction are seen.  The pulmonary artery is significantly displaced by the enlarging pericardial effusion. Filling defect is noted within the right upper  lobe pulmonary  artery consistent with pulmonary embolus. No other focal emboli are noted.  Mediastinum/Nodes: Gastric catheter is noted within the esophagus. Endotracheal tube is noted in satisfactory position. The thoracic inlet is within normal limits. A portion of the density surrounding the aorta may represent some mediastinal hematoma as well. No significant lymphadenopathy is noted.  Lungs/Pleura: Right lung is well aerated. A small focal area of pleural based density is noted which may be related to mild pulmonary infarct given the associated pulmonary embolus. Patchy atelectatic changes are noted in the right lung base as well. Consolidation of the left lower lobe with small effusion is seen. Some atelectatic changes in the left upper lobe are noted as well.  Musculoskeletal: Degenerative changes of the thoracic spine are noted. No acute bony abnormality is seen. No acute rib abnormality is noted.  Review of the MIP images confirms the above findings.  CTA ABDOMEN AND PELVIS FINDINGS  VASCULAR  Aorta: The abdominal aorta is well visualized with atherosclerotic calcifications. No aneurysmal dilatation or dissection is seen.  Celiac: Patent without evidence of aneurysm, dissection, vasculitis or significant stenosis.  SMA: Patent without evidence of aneurysm, dissection, vasculitis or significant stenosis.  Renals: Single renal arteries are noted bilaterally.  IMA: The IMA is visualized but poorly opacified which may be related to stenosis at the origin.  Iliacs: Iliac arteries are widely patent with mild atherosclerotic change. There is a focal 2.6 cm area of slight enhancement adjacent to the right common femoral artery likely representing a small pseudoaneurysm. This was not present on a prior exam from 01/29/2019. Correlate with any recent intervention in the right common femoral artery.  Veins: No specific venous abnormality is noted.  Review of the MIP  images confirms the above findings.  NON-VASCULAR  Hepatobiliary: Liver is within normal limits. Reflux into the hepatic veins is noted related to decreased cardiac output. Gallbladder has been surgically removed.  Pancreas: Unremarkable. No pancreatic ductal dilatation or surrounding inflammatory changes.  Spleen: Normal in size without focal abnormality.  Adrenals/Urinary Tract: Adrenal glands are within normal limits. Large cyst is again noted in the right kidney stable from prior exams. No renal calculi or obstructive changes are seen. The bladder is partially distended.  Stomach/Bowel: Colon demonstrates mild fluid within. No obstructive or inflammatory changes of the large or small bowel are noted. The appendix is within normal limits. The stomach is unremarkable.  Lymphatic: No significant lymphadenopathy is noted.  Reproductive: Prostate is unremarkable.  Other: No abdominal wall hernia or abnormality. No abdominopelvic ascites.  Musculoskeletal: No acute or significant osseous findings.A bone island is noted in the right iliac bone.  Review of the MIP images confirms the above findings.  IMPRESSION: Changes consistent with active aortic extravasation with enlarging pericardial effusion. The extravasation occurs adjacent to the origin of a reimplanted coronary artery which is somewhat diminutive on the current exam. It previously coursed along the posterior interventricular septum. This may represent breakdown of the reimplantation site.  Right upper lobe pulmonary arterial emboli new from the prior study.  Left lower lobe consolidation with smaller areas of atelectasis in the right lower lobe and left upper lobe.  Pseudoaneurysm arising from the right common femoral artery. This is new from a prior exam from March of 2020. Correlate with any recent intervention in the right common femoral artery.  Critical Value/emergent results were called  by telephone at the time of interpretation on 09/25/2019 at 2:24 am to Dr. Delora Fuel , who verbally acknowledged  these results.   Electronically Signed   By: Inez Catalina M.D.   On: 09/25/2019 02:43    Impression:  Pericardial tamponade with massive hemopericardium and possible ongoing bleeding having recently undergone biological Bentall aortic root replacement and coronary artery bypass grafting x2.  Patient suffered out of hospital cardiac arrest with unclear amount of downtime and remains in shock at this time.  His only meaningful chance for survival is emergent surgical exploration.   Plan:  To operating room immediately for emergent pericardial window and possible redo median sternotomy.  Patient's wife was briefly appraised of the circumstances and understands the grave nature of the patient's clinical problem.  All questions answered.   I spent in excess of 60 minutes during the conduct of this hospital consultation and >50% of this time involved direct face-to-face encounter for counseling and/or coordination of the patient's care.    Valentina Gu. Roxy Manns, MD 09/25/2019 2:46 AM

## 2019-09-25 NOTE — ED Notes (Signed)
Cardiothoracic paged per Dr. Roxanne Mins by Starleen Blue

## 2019-09-25 NOTE — Op Note (Signed)
CARDIOTHORACIC SURGERY OPERATIVE NOTE  Date of Procedure:  09/25/2019  Preoperative Diagnosis:   Pericardial Tamponade  Postoperative Diagnosis:   Pericardial Tamponade  Procedure:    Emergency Redo Median Sternotomy  Evacuation of Mediastinal Hematoma  Ligation of Saphenous Vein Grafts x2 using Cardiopulmonary Bypass    Surgeon: Valentina Gu. Roxy Manns, MD  Assistant: Nani Skillern, PA-C  Anesthesia: Laurie Panda, MD  Operative Findings:  Massive hemopericardium with old clot and acute hemorrhage  Active bleeding from proximal anastomosis of both saphenous vein grafts    BRIEF CLINICAL NOTE AND INDICATIONS FOR SURGERY  Patient is a 63 year old male with history of bicuspid aortic valve, ascending aortic aneurysm, coronary artery disease, hypertension, pulmonary hypertension, former smoker, asthma, hyperlipidemia, hypothyroidism, and BPH who underwent biological Bentall aortic root replacement and coronary artery bypass grafting x2 on August 27, 2019 by Dr. Orvan Seen.  He was discharged from the hospital on the seventh postoperative day.  He was readmitted to the hospital September 16, 2019 with severe chest pain and tachycardia.  CT scan and transthoracic echocardiogram revealed slight increase in the amount of pericardial effusion and/or blood in the mediastinum.  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  During his hospitalization he had a brief episode of atrial fibrillation for which he was treated with amiodarone.  He promptly converted to sinus rhythm and was not started on oral anticoagulation.  On the morning of September 23, 2019 had a brief episode of hypotension which resolved without significant intervention.  He was discharged from the hospital yesterday.  Overnight the patient began to develop bloody drainage from his sternal wound.  According to his wife he otherwise felt well until early this morning she suddenly found him unresponsive.  She moved him  to the floor and began CPR.  EMS was called and brought him directly to the emergency room where he was unresponsive on arrival.  EKG revealed diffuse ST segment changes suspicious for inferior wall ischemia.  Both cardiology and cardiothoracic surgical consultations were requested.  At my urging the patient was sent for stat CT angiogram.  Upon my arrival to the emergency department CT angiogram had just been completed and the patient had been intubated and placed on mechanical ventilation.    Review of the CT angiogram revealed massive hemopericardium with suggestion of active extravasation from the proximal aortic root, particularly surrounding the patent saphenous vein graft to the posterior descending coronary artery.  The patient's wife was appraised of the circumstances and the patient was immediately brought expeditiously to the operating room for emergent surgical exploration.     DETAILS OF THE OPERATIVE PROCEDURE  Preparation:  The patient is brought to the operating room on the above mentioned date and central monitoring was established by the anesthesia team including placement of right internal jugular central venous catheter and radial arterial line. The patient is placed in the supine position on the operating table.  Intravenous antibiotics are administered. General endotracheal anesthesia is induced. A Foley catheter is placed.  Baseline transesophageal echocardiogram was performed.  Findings were notable for massive hemopericardium with signs consistent with pericardial tamponade.  The patient's chest, abdomen, both groins, and both lower extremities are prepared and draped in a sterile manner. A time out procedure is performed.   Surgical Approach:  A redo median sternotomy incision was performed.  The sternal wires are removed.  There was already some ossification of the previous sternotomy, and the sternum was divided using an oscillating saw without difficulty.  Upon entry  into the mediastinum there was immediately a rush of bright red blood.  A retractor was gently placed and spread in the midline and the site of active bleeding identified at the proximal anastomosis of a saphenous vein graft off of the ascending aortic graft.  This is controlled with digital palpation.  Electrocautery was utilized to free up the sternum to allow for additional retraction.  There is a large amount of old clot which is partially fibrotic surrounding the ascending aorta as well as clot extending all the way around circumferentially the pericardial space.  Already at this point there is dense fibrous tissue as well such that the pericardial space is not easily developed.  Additional bleeding is noted at the proximal end of the saphenous vein graft to the left circumflex system.  Because of massive ongoing bleeding and worsening hypotension, a decision is made to proceed directly towards initiation of cardiopulmonary bypass.   Extracorporeal Cardiopulmonary Bypass:  Percutaneous venous access is obtained on the left side.  Using ultrasound guidance the left common femoral vein was cannulated using the Seldinger technique and single Perclose vascular closure devise was placed, after which time the patient is heparinized systemically.  A guidewire advanced through the femoral vein and up through the inferior vena cava into the right atrium using TEE guidance.  The femoral vein cannulated using a 22 Fr long femoral venous cannula.  The ascending aortic graft is cannulated for cardiopulmonary bypass.  Cardiopulmonary bypass is initiated.  During the last several minutes prior to initiation of cardiopulmonary bypass while cannulation was being performed the patient developed hypotension related to massive acute blood loss anemia.  He responded favorably to volume administration and other resuscitation efforts managed by the anesthesia team.  Once cardiopulmonary bypass was initiated the patient rapidly  recovered and blood pressure stabilized.   Evacuation of Mediastinal Hematoma and Ligation of Saphenous Vein Grafts:  Once the patient was on cardiopulmonary bypass attention was redirected to the size of massive bleeding originating from the proximal anastomoses of both vein grafts.  Each vein graft is ligated and divided and the proximal anastomosis oversewn with several pledgeted Prolene sutures.  Once this is been completed there is no sign of significant active bleeding.  The old clot is removed from surrounding the epicardial surface of the heart and the aortic root graft.  Parts of the anterior lateral wall of the heart are densely adherent to the pericardium and no attempt is made to completely free up the heart circumferentially.  However, using transesophageal echocardiogram resolution of the hemopericardium is confirmed.  The mediastinum is irrigated with copious saline solution containing vancomycin.   Procedure Completion:  The patient is weaned and disconnected from cardiopulmonary bypass.  The patient's rhythm at separation from bypass was sinus.  The patient was weaned from cardiopulmonary bypass without any inotropic support. Total cardiopulmonary bypass time for the operation was 29 minutes.  Followup transesophageal echocardiogram performed after separation from bypass revealed a well-seated aortic valve prosthesis that was functioning normally and without any sign of paravalvular leak.  Left ventricular function was normal.  The aortic and venous cannula were removed uneventfully.  The Perclose sutures were secured from the femoral venous cannulation site.  Protamine was administered to reverse the anticoagulation. The mediastinum was inspected for hemostasis and irrigated with saline solution. The mediastinum and both pleural spaces were drained using 4 chest tubes placed through separate stab incisions inferiorly.  The soft tissues anterior to the aorta were reapproximated  loosely. The  sternum is closed with double strength sternal wire. The soft tissues anterior to the sternum were closed in multiple layers and the skin is closed with a running subcuticular skin closure.  The post-bypass portion of the operation was notable for stable rhythm and hemodynamics.  The patient received 8 units packed red blood cells during the procedure due to acute blood loss anemia.  The patient received a total of 1 pack adult platelets and 8 units fresh frozen plasma due to coagulopathy and thrombocytopenia after separation from cardiopulmonary bypass and reversal of heparin with protamine.    Disposition:  The patient was transported to the surgical intensive care in critical but stable condition. There are no intraoperative complications. All sponge instrument and needle counts are verified correct at completion of the operation.    Valentina Gu. Roxy Manns MD 09/25/2019 6:14 AM

## 2019-09-25 NOTE — Anesthesia Preprocedure Evaluation (Signed)
Anesthesia Evaluation   Patient unresponsive    Reviewed: Unable to perform ROS - Chart review onlyPreop documentation limited or incomplete due to emergent nature of procedure.  Airway Mallampati: Intubated       Dental   Pulmonary           Cardiovascular      Neuro/Psych    GI/Hepatic   Endo/Other    Renal/GU      Musculoskeletal   Abdominal   Peds  Hematology   Anesthesia Other Findings S/p Bentall found down at home, underwent cpr by wife, hemopericardium noted  Reproductive/Obstetrics                             Anesthesia Physical Anesthesia Plan  ASA: V and emergent  Anesthesia Plan: General   Post-op Pain Management:    Induction: Intravenous  PONV Risk Score and Plan: 2 and Treatment may vary due to age or medical condition  Airway Management Planned: Oral ETT  Additional Equipment: Arterial line, CVP, TEE and Ultrasound Guidance Line Placement  Intra-op Plan:   Post-operative Plan: Post-operative intubation/ventilation  Informed Consent:     History available from chart only and Only emergency history available  Plan Discussed with: CRNA and Surgeon  Anesthesia Plan Comments:         Anesthesia Quick Evaluation

## 2019-09-25 NOTE — Telephone Encounter (Signed)
Left message for patient to call back to follow up on how he is feeling after hospital visit. Patient does not need TCM done. Patient needs follow up appointment scheduled with Dr Silvio Pate is able.

## 2019-09-25 NOTE — Progress Notes (Signed)
Chaplain responded to STEMi page at 1:49 pm.  Patient in Room 19. Chaplain greeted patient's wife at ER waiting area and brought her to Consult Room.  Chaplain was with patient's wife when doctors explained next steps.  Chaplain escorted pt's wife to [facility] waiting area. Will continue to be available. Rev. Tamsen Snider Pager [phone]

## 2019-09-25 NOTE — Anesthesia Procedure Notes (Signed)
Performed by: Geoge Lawrance S, CRNA       

## 2019-09-25 NOTE — ED Notes (Signed)
Patient taken to OR with RN, RT and MDs.

## 2019-09-26 ENCOUNTER — Inpatient Hospital Stay (HOSPITAL_COMMUNITY): Payer: PRIVATE HEALTH INSURANCE

## 2019-09-26 LAB — GLUCOSE, CAPILLARY
Glucose-Capillary: 100 mg/dL — ABNORMAL HIGH (ref 70–99)
Glucose-Capillary: 107 mg/dL — ABNORMAL HIGH (ref 70–99)
Glucose-Capillary: 121 mg/dL — ABNORMAL HIGH (ref 70–99)
Glucose-Capillary: 74 mg/dL (ref 70–99)
Glucose-Capillary: 81 mg/dL (ref 70–99)
Glucose-Capillary: 88 mg/dL (ref 70–99)
Glucose-Capillary: 96 mg/dL (ref 70–99)

## 2019-09-26 LAB — CBC
HCT: 28.6 % — ABNORMAL LOW (ref 39.0–52.0)
HCT: 27.9 % — ABNORMAL LOW (ref 39.0–52.0)
Hemoglobin: 9.4 g/dL — ABNORMAL LOW (ref 13.0–17.0)
Hemoglobin: 9.3 g/dL — ABNORMAL LOW (ref 13.0–17.0)
MCH: 29.8 pg (ref 26.0–34.0)
MCH: 30.2 pg (ref 26.0–34.0)
MCHC: 32.9 g/dL (ref 30.0–36.0)
MCHC: 33.3 g/dL (ref 30.0–36.0)
MCV: 90.8 fL (ref 80.0–100.0)
MCV: 90.6 fL (ref 80.0–100.0)
Platelets: 122 10*3/uL — ABNORMAL LOW (ref 150–400)
Platelets: 148 10*3/uL — ABNORMAL LOW (ref 150–400)
RBC: 3.15 MIL/uL — ABNORMAL LOW (ref 4.22–5.81)
RBC: 3.08 MIL/uL — ABNORMAL LOW (ref 4.22–5.81)
RDW: 16.8 % — ABNORMAL HIGH (ref 11.5–15.5)
RDW: 16.8 % — ABNORMAL HIGH (ref 11.5–15.5)
WBC: 10.1 10*3/uL (ref 4.0–10.5)
WBC: 12.7 10*3/uL — ABNORMAL HIGH (ref 4.0–10.5)
nRBC: 0 % (ref 0.0–0.2)
nRBC: 0 % (ref 0.0–0.2)

## 2019-09-26 LAB — PREPARE FRESH FROZEN PLASMA
Unit division: 0
Unit division: 0
Unit division: 0
Unit division: 0
Unit division: 0

## 2019-09-26 LAB — BASIC METABOLIC PANEL
Anion gap: 9 (ref 5–15)
Anion gap: 9 (ref 5–15)
BUN: 9 mg/dL (ref 8–23)
BUN: 14 mg/dL (ref 8–23)
CO2: 21 mmol/L — ABNORMAL LOW (ref 22–32)
CO2: 21 mmol/L — ABNORMAL LOW (ref 22–32)
Calcium: 8.3 mg/dL — ABNORMAL LOW (ref 8.9–10.3)
Calcium: 8 mg/dL — ABNORMAL LOW (ref 8.9–10.3)
Chloride: 110 mmol/L (ref 98–111)
Chloride: 108 mmol/L (ref 98–111)
Creatinine, Ser: 1.09 mg/dL (ref 0.61–1.24)
Creatinine, Ser: 1.16 mg/dL (ref 0.61–1.24)
GFR calc Af Amer: >60 mL/min (ref >60)
GFR calc Af Amer: >60 mL/min (ref >60)
GFR calc non Af Amer: >60 mL/min (ref >60)
GFR calc non Af Amer: >60 mL/min (ref >60)
Glucose, Bld: 86 mg/dL (ref 70–99)
Glucose, Bld: 110 mg/dL — ABNORMAL HIGH (ref 70–99)
Potassium: 4.3 mmol/L (ref 3.5–5.1)
Potassium: 3.7 mmol/L (ref 3.5–5.1)
Sodium: 140 mmol/L (ref 135–145)
Sodium: 138 mmol/L (ref 135–145)

## 2019-09-26 LAB — BPAM RBC
Blood Product Expiration Date: 202011042359
Blood Product Expiration Date: 202011242359
Blood Product Expiration Date: 202011272359
Blood Product Expiration Date: 202011272359
Blood Product Expiration Date: 202011272359
Blood Product Expiration Date: 202011272359
Blood Product Expiration Date: 202011282359
Blood Product Expiration Date: 202011282359
ISSUE DATE / TIME: 202010300312
ISSUE DATE / TIME: 202010300312
ISSUE DATE / TIME: 202010300312
ISSUE DATE / TIME: 202010300312
ISSUE DATE / TIME: 202010300400
ISSUE DATE / TIME: 202010300400
ISSUE DATE / TIME: 202010300400
ISSUE DATE / TIME: 202010300400
Unit Type and Rh: 6200
Unit Type and Rh: 6200
Unit Type and Rh: 6200
Unit Type and Rh: 6200
Unit Type and Rh: 6200
Unit Type and Rh: 6200
Unit Type and Rh: 6200
Unit Type and Rh: 6200

## 2019-09-26 LAB — TYPE AND SCREEN
ABO/RH(D): A POS
Antibody Screen: NEGATIVE
Unit division: 0
Unit division: 0
Unit division: 0
Unit division: 0
Unit division: 0
Unit division: 0
Unit division: 0
Unit division: 0

## 2019-09-26 LAB — PREPARE CRYOPRECIPITATE: Unit division: 0

## 2019-09-26 LAB — BPAM FFP
Blood Product Expiration Date: 202011012043
Blood Product Expiration Date: 202011012359
Blood Product Expiration Date: 202011012359
Blood Product Expiration Date: 202011012359
Blood Product Expiration Date: 202011042359
Blood Product Expiration Date: 202011042359
ISSUE DATE / TIME: 202010300405
ISSUE DATE / TIME: 202010300405
ISSUE DATE / TIME: 202010300405
ISSUE DATE / TIME: 202010300405
ISSUE DATE / TIME: 202010300503
ISSUE DATE / TIME: 202010300503
Unit Type and Rh: 6200
Unit Type and Rh: 6200
Unit Type and Rh: 6200
Unit Type and Rh: 6200
Unit Type and Rh: 6200
Unit Type and Rh: 6200

## 2019-09-26 LAB — PREPARE PLATELET PHERESIS: Unit division: 0

## 2019-09-26 LAB — GI PATHOGEN PANEL BY PCR, STOOL

## 2019-09-26 LAB — BPAM PLATELET PHERESIS
Blood Product Expiration Date: 202010312359
ISSUE DATE / TIME: 202010300507
Unit Type and Rh: 6200

## 2019-09-26 LAB — BPAM CRYOPRECIPITATE
Blood Product Expiration Date: 202010301100
ISSUE DATE / TIME: 202010300527
Unit Type and Rh: 6200

## 2019-09-26 LAB — MAGNESIUM
Magnesium: 1.9 mg/dL (ref 1.7–2.4)
Magnesium: 1.7 mg/dL (ref 1.7–2.4)

## 2019-09-26 MED ORDER — FUROSEMIDE 10 MG/ML IJ SOLN
40.0000 mg | Freq: Once | INTRAMUSCULAR | Status: AC
  Administered 2019-09-26: 11:00:00 40 mg via INTRAVENOUS
  Filled 2019-09-26: qty 4

## 2019-09-26 NOTE — Progress Notes (Signed)
      [address]       [place] [address]             [phone]                 1 Day Post-Op Procedure(s) (LRB): REDO STERNOTOMY (N/A) Transesophageal Echocardiogram (Tee) (N/A) Evacuation Hematoma - Mediastinal Ligation of saphenous vein graft x two with Cardiopulmonary Bypass (N/A)   Events: No events overnight _______________________________________________________________ Vitals: BP 113/83 (BP Location: Right Arm)   Pulse 91   Temp 98.6 F (37 C) (Oral)   Resp 18   Ht 5\' 8"  (1.727 m)   Wt 65.8 kg   SpO2 96%   BMI 22.05 kg/m   - Neuro: alert NAD  - Cardiovascular: sinus  Drips: none. CVP:  [11 mmHg] 11 mmHg  - Pulm: EWOB, on RA  ABG    Component Value Date/Time   PHART 7.409 09/25/2019 1837   PCO2ART 30.1 (L) 09/25/2019 1837   PO2ART 95.0 09/25/2019 1837   HCO3 19.0 (L) 09/25/2019 1837   TCO2 20 (L) 09/25/2019 1837   ACIDBASEDEF 5.0 (H) 09/25/2019 1837   O2SAT 97.0 09/25/2019 1837    - Abd: soft, ND - Extremity: edematous.  Left upper extremity edema  .Intake/Output      10/30 0701 - 10/31 0700 10/31 0701 - 11/01 0700   I.V. (mL/kg) 2062 (31.3)    Blood     IV Piggyback 735.2    Total Intake(mL/kg) 2797.2 (42.5)    Urine (mL/kg/hr) 1235 (0.8) 50 (0.3)   Chest Tube 1551 30   Total Output 2786 80   Net +11.2 -80           _______________________________________________________________ Labs: CBC Latest Ref Rng & Units 09/26/2019 09/25/2019 09/25/2019  WBC 4.0 - 10.5 K/uL 10.1 - -  Hemoglobin 13.0 - 17.0 g/dL 9.4(L) 7.8(L) 8.5(L)  Hematocrit 39.0 - 52.0 % 28.6(L) 23.0(L) 25.0(L)  Platelets 150 - 400 K/uL 122(L) - -   CMP Latest Ref Rng & Units 09/26/2019 09/25/2019 09/25/2019  Glucose 70 - 99 mg/dL 86 - -  BUN 8 - 23 mg/dL 9 - -  Creatinine 0.61 - 1.24 mg/dL 1.09 - -  Sodium 135 - 145 mmol/L 140 143 141  Potassium 3.5 - 5.1 mmol/L 4.3 4.1 3.5  Chloride 98 - 111 mmol/L 110 - -  CO2 22 - 32 mmol/L 21(L) - -  Calcium  8.9 - 10.3 mg/dL 8.3(L) - -    CXR: Left effusion, right atelectasis  _______________________________________________________________  Assessment and Plan: POD 1 s/p evacuation of hemopericardium and ligation of vein grafts  Neuro: pain controlled CV: will remove A-line, and central line Pulm: pulm toilet Renal: creat stable.  Good uop.  Gentle diuresis today GI: advancing diet Heme: stable ID: afebrile Endo: on SSI Dispo: continue ICU care.  Pt states that he does not want to go to 4E.  Melodie Bouillon, MD 09/26/2019 9:38 AM

## 2019-09-26 NOTE — Progress Notes (Signed)
RN states aware of d/c CVC order.

## 2019-09-26 NOTE — Progress Notes (Signed)
OK to give albumin for volume management per Dr. Roxy Manns.

## 2019-09-27 ENCOUNTER — Inpatient Hospital Stay (HOSPITAL_COMMUNITY): Payer: PRIVATE HEALTH INSURANCE

## 2019-09-27 DIAGNOSIS — D62 Acute posthemorrhagic anemia: Secondary | ICD-10-CM | POA: Diagnosis present

## 2019-09-27 LAB — GLUCOSE, CAPILLARY
Glucose-Capillary: 86 mg/dL (ref 70–99)
Glucose-Capillary: 107 mg/dL — ABNORMAL HIGH (ref 70–99)
Glucose-Capillary: 101 mg/dL — ABNORMAL HIGH (ref 70–99)
Glucose-Capillary: 82 mg/dL (ref 70–99)

## 2019-09-27 LAB — CBC
HCT: 26.7 % — ABNORMAL LOW (ref 39.0–52.0)
Hemoglobin: 9 g/dL — ABNORMAL LOW (ref 13.0–17.0)
MCH: 30.1 pg (ref 26.0–34.0)
MCHC: 33.7 g/dL (ref 30.0–36.0)
MCV: 89.3 fL (ref 80.0–100.0)
Platelets: 169 10*3/uL (ref 150–400)
RBC: 2.99 MIL/uL — ABNORMAL LOW (ref 4.22–5.81)
RDW: 16.6 % — ABNORMAL HIGH (ref 11.5–15.5)
WBC: 12.5 10*3/uL — ABNORMAL HIGH (ref 4.0–10.5)
nRBC: 0 % (ref 0.0–0.2)

## 2019-09-27 LAB — BASIC METABOLIC PANEL
Anion gap: 8 (ref 5–15)
BUN: 15 mg/dL (ref 8–23)
CO2: 21 mmol/L — ABNORMAL LOW (ref 22–32)
Calcium: 7.9 mg/dL — ABNORMAL LOW (ref 8.9–10.3)
Chloride: 107 mmol/L (ref 98–111)
Creatinine, Ser: 0.92 mg/dL (ref 0.61–1.24)
GFR calc Af Amer: >60 mL/min (ref >60)
GFR calc non Af Amer: >60 mL/min (ref >60)
Glucose, Bld: 87 mg/dL (ref 70–99)
Potassium: 3.6 mmol/L (ref 3.5–5.1)
Sodium: 136 mmol/L (ref 135–145)

## 2019-09-27 MED ORDER — LEVOTHYROXINE SODIUM 25 MCG PO TABS: 125.0000 ug | ORAL_TABLET | Freq: Every day | ORAL | Status: DC

## 2019-09-27 MED ORDER — LEVOTHYROXINE SODIUM 25 MCG PO TABS
125.0000 ug | ORAL_TABLET | Freq: Every day | ORAL | Status: DC
  Administered 2019-09-27 – 2019-09-28 (×2): 125 ug via ORAL
  Filled 2019-09-27 (×2): qty 1

## 2019-09-27 MED ORDER — FUROSEMIDE 40 MG PO TABS
40.0000 mg | ORAL_TABLET | Freq: Every day | ORAL | Status: DC
  Administered 2019-09-27: 40 mg via ORAL
  Filled 2019-09-27: qty 1

## 2019-09-27 MED ORDER — METOPROLOL TARTRATE 12.5 MG HALF TABLET
12.5000 mg | ORAL_TABLET | Freq: Two times a day (BID) | ORAL | Status: DC
  Administered 2019-09-27 (×2): 12.5 mg via ORAL
  Filled 2019-09-27 (×3): qty 1

## 2019-09-27 MED ORDER — POTASSIUM CHLORIDE CRYS ER 20 MEQ PO TBCR
40.0000 meq | EXTENDED_RELEASE_TABLET | Freq: Once | ORAL | Status: AC
  Administered 2019-09-27: 40 meq via ORAL
  Filled 2019-09-27: qty 2

## 2019-09-27 MED ORDER — ATORVASTATIN CALCIUM 80 MG PO TABS: 80.0000 mg | ORAL_TABLET | Freq: Every day | ORAL | Status: DC

## 2019-09-27 NOTE — Progress Notes (Signed)
      [address]       [place] [address]             [phone]                 2 Days Post-Op Procedure(s) (LRB): REDO STERNOTOMY (N/A) Transesophageal Echocardiogram (Tee) (N/A) Evacuation Hematoma - Mediastinal Ligation of saphenous vein graft x two with Cardiopulmonary Bypass (N/A)   Events: Doing well afib X 2 yesterday, converted with IV metop Left upper ext swelling slightly improved _______________________________________________________________ Vitals: BP 114/82 (BP Location: Right Arm)   Pulse 77   Temp 98.1 F (36.7 C) (Oral)   Resp 14   Ht 5\' 8"  (1.727 m)   Wt 76.7 kg   SpO2 96%   BMI 25.71 kg/m   - Neuro: alert NAD  - Cardiovascular: sinus  Drips: none.    - PulmMarzetta Merino, on RA  ABG    Component Value Date/Time   PHART 7.409 09/25/2019 1837   PCO2ART 30.1 (L) 09/25/2019 1837   PO2ART 95.0 09/25/2019 1837   HCO3 19.0 (L) 09/25/2019 1837   TCO2 20 (L) 09/25/2019 1837   ACIDBASEDEF 5.0 (H) 09/25/2019 1837   O2SAT 97.0 09/25/2019 1837    - Abd: soft, ND - Extremity: edematous.  Left upper extremity edema improved  .Intake/Output      10/31 0701 - 11/01 0700 11/01 0701 - 11/02 0700   P.O. 720    I.V. (mL/kg) 52.2 (0.7)    IV Piggyback 100.1    Total Intake(mL/kg) 872.2 (11.4)    Urine (mL/kg/hr) 60 (0)    Chest Tube 280    Total Output 340    Net +532.2         Urine Occurrence 1525 x       _______________________________________________________________ Labs: CBC Latest Ref Rng & Units 09/27/2019 09/26/2019 09/26/2019  WBC 4.0 - 10.5 K/uL 12.5(H) 12.7(H) 10.1  Hemoglobin 13.0 - 17.0 g/dL 9.0(L) 9.3(L) 9.4(L)  Hematocrit 39.0 - 52.0 % 26.7(L) 27.9(L) 28.6(L)  Platelets 150 - 400 K/uL 169 148(L) 122(L)   CMP Latest Ref Rng & Units 09/27/2019 09/26/2019 09/26/2019  Glucose 70 - 99 mg/dL 87 110(H) 86  BUN 8 - 23 mg/dL 15 14 9   Creatinine 0.61 - 1.24 mg/dL 0.92 1.16 1.09  Sodium 135 - 145 mmol/L 136 138 140   Potassium 3.5 - 5.1 mmol/L 3.6 3.7 4.3  Chloride 98 - 111 mmol/L 107 108 110  CO2 22 - 32 mmol/L 21(L) 21(L) 21(L)  Calcium 8.9 - 10.3 mg/dL 7.9(L) 8.0(L) 8.3(L)    CXR: Left effusion, right atelectasis  _______________________________________________________________  Assessment and Plan: POD 2 s/p evacuation of hemopericardium and ligation of vein grafts  Neuro: pain controlled CV: will start BB for afib.  Checking left upper ext [place].  Will keep CT for now Pulm: pulm toilet Renal: creat stable.  Good uop.  Continue diuresis GI: advancing diet Heme: stable ID: afebrile Endo: on SSI Dispo: continue ICU care.  Pt states that he does not want to go to 4E.  Melodie Bouillon, MD 09/27/2019 9:02 AM

## 2019-09-27 NOTE — Progress Notes (Signed)
     [address]       [place] [address]             [phone]       No events LUE swelling continues to improve Wife would like an echo prior to discharge  [place]

## 2019-09-28 ENCOUNTER — Inpatient Hospital Stay (HOSPITAL_COMMUNITY): Payer: PRIVATE HEALTH INSURANCE

## 2019-09-28 ENCOUNTER — Telehealth: Payer: Self-pay | Admitting: Cardiovascular Disease

## 2019-09-28 DIAGNOSIS — R609 Edema, unspecified: Secondary | ICD-10-CM

## 2019-09-28 LAB — CBC
HCT: 28.9 % — ABNORMAL LOW (ref 39.0–52.0)
Hemoglobin: 9.4 g/dL — ABNORMAL LOW (ref 13.0–17.0)
MCH: 29.5 pg (ref 26.0–34.0)
MCHC: 32.5 g/dL (ref 30.0–36.0)
MCV: 90.6 fL (ref 80.0–100.0)
Platelets: 213 10*3/uL (ref 150–400)
RBC: 3.19 MIL/uL — ABNORMAL LOW (ref 4.22–5.81)
RDW: 16.7 % — ABNORMAL HIGH (ref 11.5–15.5)
WBC: 9.3 10*3/uL (ref 4.0–10.5)
nRBC: 0 % (ref 0.0–0.2)

## 2019-09-28 LAB — BASIC METABOLIC PANEL
Anion gap: 7 (ref 5–15)
BUN: 14 mg/dL (ref 8–23)
CO2: 23 mmol/L (ref 22–32)
Calcium: 7.7 mg/dL — ABNORMAL LOW (ref 8.9–10.3)
Chloride: 107 mmol/L (ref 98–111)
Creatinine, Ser: 0.76 mg/dL (ref 0.61–1.24)
GFR calc Af Amer: >60 mL/min (ref >60)
GFR calc non Af Amer: >60 mL/min (ref >60)
Glucose, Bld: 71 mg/dL (ref 70–99)
Potassium: 3.7 mmol/L (ref 3.5–5.1)
Sodium: 137 mmol/L (ref 135–145)

## 2019-09-28 LAB — MRSA PCR SCREENING: MRSA by PCR: NEGATIVE

## 2019-09-28 MED ORDER — FERROUS SULFATE 325 (65 FE) MG PO TABS
325.0000 mg | ORAL_TABLET | Freq: Every day | ORAL | Status: DC
  Administered 2019-09-28 – 2019-10-01 (×4): 325 mg via ORAL
  Filled 2019-09-28 (×4): qty 1

## 2019-09-28 MED ORDER — ATORVASTATIN CALCIUM 80 MG PO TABS
80.0000 mg | ORAL_TABLET | Freq: Every day | ORAL | Status: DC
  Administered 2019-09-28 – 2019-10-01 (×4): 80 mg via ORAL
  Filled 2019-09-28 (×4): qty 1

## 2019-09-28 MED ORDER — ASPIRIN EC 81 MG PO TBEC
81.0000 mg | DELAYED_RELEASE_TABLET | Freq: Every day | ORAL | Status: DC
  Administered 2019-09-28 – 2019-10-02 (×5): 81 mg via ORAL
  Filled 2019-09-28 (×5): qty 1

## 2019-09-28 MED ORDER — POTASSIUM CHLORIDE CRYS ER 20 MEQ PO TBCR
20.0000 meq | EXTENDED_RELEASE_TABLET | ORAL | Status: AC
  Administered 2019-09-28 (×3): 20 meq via ORAL
  Filled 2019-09-28 (×3): qty 1

## 2019-09-28 MED ORDER — POTASSIUM CHLORIDE CRYS ER 20 MEQ PO TBCR
40.0000 meq | EXTENDED_RELEASE_TABLET | Freq: Two times a day (BID) | ORAL | Status: AC
  Administered 2019-09-28 – 2019-09-29 (×4): 40 meq via ORAL
  Filled 2019-09-28 (×4): qty 2

## 2019-09-28 MED ORDER — LEVOTHYROXINE SODIUM 25 MCG PO TABS
125.0000 ug | ORAL_TABLET | Freq: Every day | ORAL | Status: DC
  Administered 2019-09-28 – 2019-10-02 (×5): 125 ug via ORAL
  Filled 2019-09-28 (×6): qty 1

## 2019-09-28 MED ORDER — PANTOPRAZOLE SODIUM 40 MG PO TBEC
40.0000 mg | DELAYED_RELEASE_TABLET | Freq: Two times a day (BID) | ORAL | Status: DC
  Administered 2019-09-28 – 2019-10-02 (×7): 40 mg via ORAL
  Filled 2019-09-28 (×8): qty 1

## 2019-09-28 MED ORDER — FERROUS SULFATE 325 (65 FE) MG PO TBEC: 325.0000 mg | DELAYED_RELEASE_TABLET | Freq: Every day | ORAL | Status: DC

## 2019-09-28 MED ORDER — AMIODARONE HCL 200 MG PO TABS
200.0000 mg | ORAL_TABLET | Freq: Every day | ORAL | Status: DC
  Administered 2019-09-28 – 2019-10-02 (×5): 200 mg via ORAL
  Filled 2019-09-28 (×5): qty 1

## 2019-09-28 MED ORDER — ~~LOC~~ CARDIAC SURGERY, PATIENT & FAMILY EDUCATION
Freq: Once | Status: AC
  Administered 2019-09-28: 15:00:00

## 2019-09-28 MED ORDER — FUROSEMIDE 10 MG/ML IJ SOLN
40.0000 mg | Freq: Two times a day (BID) | INTRAMUSCULAR | Status: AC
  Administered 2019-09-28 – 2019-09-29 (×4): 40 mg via INTRAVENOUS
  Filled 2019-09-28 (×4): qty 4

## 2019-09-28 NOTE — Telephone Encounter (Signed)
Spoke with patients wife per release form and she reports patient is currently in hospital at [facility]. She wanted to inquire about heart monitor that was ordered for patient and advised that when he is discharged from the hospital they will assist with getting that ordered for him. Recommended that she discuss with discharging physician and that I would also make Dr. Rockey Situ aware of his admission. She also wanted to talk with scheduling to see if they could assist with appointment change. Will send to scheduling to assist with this as well.

## 2019-09-28 NOTE — Progress Notes (Signed)
      [address]       [place] [address]             [phone]        CARDIOTHORACIC SURGERY PROGRESS NOTE   R3 Days Post-Op Procedure(s) (LRB): REDO STERNOTOMY (N/A) Transesophageal Echocardiogram (Tee) (N/A) Evacuation Hematoma - Mediastinal Ligation of saphenous vein graft x two with Cardiopulmonary Bypass (N/A)  Subjective: Reports feeling "great"  Objective: Vital signs: BP Readings from Last 1 Encounters:  09/28/19 117/82   Pulse Readings from Last 1 Encounters:  09/28/19 79   Resp Readings from Last 1 Encounters:  09/28/19 17   Temp Readings from Last 1 Encounters:  09/28/19 97.9 F (36.6 C) (Oral)    Hemodynamics:    Physical Exam:  Rhythm:   sinus  Breath sounds: clear  Heart sounds:  RRR  Incisions:  Dressings dry, intact  Abdomen:  Soft, non-distended, non-tender=  Extremities:  Warm, well-perfused  Chest tubes:  low volume thin serosanguinous output, no air leak    Intake/Output from previous day: 11/01 0701 - 11/02 0700 In: 480 [P.O.:480] Out: 860 [Urine:650; Chest Tube:210] Intake/Output this shift: No intake/output data recorded.  Lab Results:  CBC: Recent Labs    09/27/19 0231 09/28/19 0307  WBC 12.5* 9.3  HGB 9.0* 9.4*  HCT 26.7* 28.9*  PLT 169 213    BMET:  Recent Labs    09/27/19 0231 09/28/19 0307  NA 136 137  K 3.6 3.7  CL 107 107  CO2 21* 23  GLUCOSE 87 71  BUN 15 14  CREATININE 0.92 0.76  CALCIUM 7.9* 7.7*     PT/INR:  No results for input(s): LABPROT, INR in the last 72 hours.  CBG (last 3)  Recent Labs    09/27/19 0757 09/27/19 1216 09/27/19 1611  GLUCAP 107* 101* 82    ABG    Component Value Date/Time   PHART 7.409 09/25/2019 1837   PCO2ART 30.1 (L) 09/25/2019 1837   PO2ART 95.0 09/25/2019 1837   HCO3 19.0 (L) 09/25/2019 1837   TCO2 20 (L) 09/25/2019 1837   ACIDBASEDEF 5.0 (H) 09/25/2019 1837   O2SAT 97.0 09/25/2019 1837    CXR: PORTABLE CHEST 1 VIEW  COMPARISON:   September 27, 2019  FINDINGS: Bilateral chest tubes and a mediastinal drain are again noted. The heart size is stable from prior study. The patient is status post prior median sternotomy. There is no pneumothorax. There are small bilateral pleural effusions with adjacent airspace opacities. There is no acute displaced fracture. Mild vascular congestion is noted.  IMPRESSION: Stable appearance of the chest with no evidence for pneumothorax.   Electronically Signed   By: Constance Holster M.D.   On: 09/28/2019 05:55  Assessment/Plan: S/P Procedure(s) (LRB): REDO STERNOTOMY (N/A) Transesophageal Echocardiogram (Tee) (N/A) Evacuation Hematoma - Mediastinal Ligation of saphenous vein graft x two with Cardiopulmonary Bypass (N/A)  Overall doing well POD3 Maintaining NSR w/ stable BP Breathing comfortably w/ O2 sats 97-100% on RA Expected post op acute blood loss anemia, mild, stable Acute on chronic diastolic CHF with expected post-op volume excess, weight reportedly 11 kg > preop baseline Post-op Afib - single brief episode over the weekend   Mobilize  Diuresis  D/C chest tubes  Continue ASA, beta blocker  Restart statin, amiodarone  Consider Coumadin if PAF recurs but will hold off for now due to risk of bleeding   Rexene Alberts, MD 09/28/2019 10:24 AM

## 2019-09-28 NOTE — Progress Notes (Signed)
Left upper ext venous duplex  has been completed. Refer to [facility] under chart review to view preliminary results.   09/28/2019  11:02 AM Reyna Lorenzi, Bonnye Fava

## 2019-09-28 NOTE — Telephone Encounter (Signed)
Please call wife, states patient needs a monitor ordered from his last visit to the hospital.

## 2019-09-28 NOTE — Telephone Encounter (Signed)
Patient calling to check status of monitor.  She wants to know if patient is to come to office or it will be mailed.    Please call.

## 2019-09-29 ENCOUNTER — Inpatient Hospital Stay (HOSPITAL_COMMUNITY): Payer: PRIVATE HEALTH INSURANCE

## 2019-09-29 ENCOUNTER — Encounter (HOSPITAL_COMMUNITY): Payer: Self-pay | Admitting: Thoracic Surgery (Cardiothoracic Vascular Surgery)

## 2019-09-29 DIAGNOSIS — R609 Edema, unspecified: Secondary | ICD-10-CM

## 2019-09-29 LAB — CBC
HCT: 30 % — ABNORMAL LOW (ref 39.0–52.0)
Hemoglobin: 9.6 g/dL — ABNORMAL LOW (ref 13.0–17.0)
MCH: 29.8 pg (ref 26.0–34.0)
MCHC: 32 g/dL (ref 30.0–36.0)
MCV: 93.2 fL (ref 80.0–100.0)
Platelets: 313 10*3/uL (ref 150–400)
RBC: 3.22 MIL/uL — ABNORMAL LOW (ref 4.22–5.81)
RDW: 16.7 % — ABNORMAL HIGH (ref 11.5–15.5)
WBC: 9.8 10*3/uL (ref 4.0–10.5)
nRBC: 0 % (ref 0.0–0.2)

## 2019-09-29 LAB — BASIC METABOLIC PANEL
Anion gap: 13 (ref 5–15)
BUN: 12 mg/dL (ref 8–23)
CO2: 24 mmol/L (ref 22–32)
Calcium: 7.9 mg/dL — ABNORMAL LOW (ref 8.9–10.3)
Chloride: 100 mmol/L (ref 98–111)
Creatinine, Ser: 0.73 mg/dL (ref 0.61–1.24)
GFR calc Af Amer: >60 mL/min (ref >60)
GFR calc non Af Amer: >60 mL/min (ref >60)
Glucose, Bld: 95 mg/dL (ref 70–99)
Potassium: 4.3 mmol/L (ref 3.5–5.1)
Sodium: 137 mmol/L (ref 135–145)

## 2019-09-29 NOTE — Telephone Encounter (Signed)
He is still in the hospital. Stepping down from ICU today. Feeling better. Has an appetite. Feeling positive about the outlook. Advised him Dr Silvio Pate would like to see him after he is discharged, but that he would be the last one to see since he has several specialists to follow-up with.

## 2019-09-29 NOTE — Progress Notes (Addendum)
[address]       [place] [address]             [phone]      4 Days Post-Op Procedure(s) (LRB): REDO STERNOTOMY (N/A) Transesophageal Echocardiogram (Tee) (N/A) Evacuation Hematoma - Mediastinal Ligation of saphenous vein graft x two with Cardiopulmonary Bypass (N/A) Subjective: conts to feel well  Objective: Vital signs in last 24 hours: Temp:  [97.7 F (36.5 C)-99.1 F (37.3 C)] 98.3 F (36.8 C) (11/03 0400) Pulse Rate:  [78-88] 80 (11/03 0523) Cardiac Rhythm: Normal sinus rhythm (11/03 0523) Resp:  [13-23] 18 (11/03 0523) BP: (102-136)/(78-91) 120/83 (11/03 0523) SpO2:  [95 %-100 %] 97 % (11/03 0038) Weight:  [71.6 kg] 71.6 kg (11/03 0500)  Hemodynamic parameters for last 24 hours:    Intake/Output from previous day: 11/02 0701 - 11/03 0700 In: 720 [P.O.:720] Out: 4100 [Urine:4100] Intake/Output this shift: No intake/output data recorded.  General appearance: alert, cooperative and no distress Heart: regular rate and rhythm and soft systolic murmur Lungs: dim left>right base Abdomen: benign Extremities: + LE edema Wound: incis healing well  Lab Results: Recent Labs    09/28/19 0307 09/29/19 0344  WBC 9.3 9.8  HGB 9.4* 9.6*  HCT 28.9* 30.0*  PLT 213 313   BMET:  Recent Labs    09/28/19 0307 09/29/19 0344  NA 137 137  K 3.7 4.3  CL 107 100  CO2 23 24  GLUCOSE 71 95  BUN 14 12  CREATININE 0.76 0.73  CALCIUM 7.7* 7.9*    PT/INR: No results for input(s): LABPROT, INR in the last 72 hours. ABG    Component Value Date/Time   PHART 7.409 09/25/2019 1837   HCO3 19.0 (L) 09/25/2019 1837   TCO2 20 (L) 09/25/2019 1837   ACIDBASEDEF 5.0 (H) 09/25/2019 1837   O2SAT 97.0 09/25/2019 1837   CBG (last 3)  Recent Labs    09/27/19 0757 09/27/19 1216 09/27/19 1611  GLUCAP 107* 101* 82    Meds Scheduled Meds: . acetaminophen (TYLENOL) oral liquid 160 mg/5 mL  1,000 mg Oral Q6H   Or  . acetaminophen  1,000 mg Oral  Q6H  . amiodarone  200 mg Oral Daily  . aspirin EC  81 mg Oral Daily  . atorvastatin  80 mg Oral QHS  . bisacodyl  10 mg Oral Daily   Or  . bisacodyl  10 mg Rectal Daily  . Chlorhexidine Gluconate Cloth  6 each Topical Daily  . docusate sodium  200 mg Oral Daily  . ferrous sulfate  325 mg Oral Q lunch  . furosemide  40 mg Intravenous BID  . levothyroxine  125 mcg Oral QAC breakfast  . pantoprazole  40 mg Oral BID  . potassium chloride  40 mEq Oral BID  . sodium chloride flush  3 mL Intravenous Q12H   Continuous Infusions: . sodium chloride    . lactated ringers Stopped (09/26/19 0936)   PRN Meds:.metoprolol tartrate, morphine injection, ondansetron (ZOFRAN) IV, oxyCODONE, sodium chloride flush, traMADol  Xrays Dg Chest 2 View  Result Date: 09/29/2019 CLINICAL DATA:  Heart surgery. EXAM: CHEST - 2 VIEW COMPARISON:  10/16/2019. FINDINGS: Interim removal of bilateral chest tubes and mediastinal drainage catheter. No pneumothorax. Prior cardiac valve replacement. Cardiomegaly. Bibasilar atelectasis/infiltrates and small bilateral pleural effusions again noted. Stable left upper lobe pleural-parenchymal thickening most consistent scarring. No acute bony abnormality. Surgical clips right upper chest and right upper quadrant. IMPRESSION: 1. Interim removal  of bilateral chest tubes and mediastinal drainage catheters. 2. Bibasilar atelectasis/infiltrates and bilateral pleural effusions, unchanged. Stable left upper lobe pleural-parenchymal thickening consistent scarring. 3. Prior cardiac valve replacement. Stable cardiomegaly. No pulmonary venous congestion. Electronically Signed   By: Marcello Moores  Register   On: 09/29/2019 05:48   Dg Chest Port 1 View  Result Date: 09/28/2019 CLINICAL DATA:  Chest tube. EXAM: PORTABLE CHEST 1 VIEW COMPARISON:  September 27, 2019 FINDINGS: Bilateral chest tubes and a mediastinal drain are again noted. The heart size is stable from prior study. The patient is status post  prior median sternotomy. There is no pneumothorax. There are small bilateral pleural effusions with adjacent airspace opacities. There is no acute displaced fracture. Mild vascular congestion is noted. IMPRESSION: Stable appearance of the chest with no evidence for pneumothorax. Electronically Signed   By: Constance Holster M.D.   On: 09/28/2019 05:55   Vas [place] Upper Extremity Venous Duplex  Result Date: 09/28/2019 UPPER VENOUS STUDY  Indications: Edema Other Indications: S/P Redo Sternotomy on 09/25/19. Comparison Study: No priors. Performing Technologist: Oda Cogan RDMS, RVT  Examination Guidelines: A complete evaluation includes B-mode imaging, spectral Doppler, color Doppler, and power Doppler as needed of all accessible portions of each vessel. Bilateral testing is considered an integral part of a complete examination. Limited examinations for reoccurring indications may be performed as noted.  Right Findings: +----------+------------+---------+-----------+----------+-------+ RIGHT     CompressiblePhasicitySpontaneousPropertiesSummary +----------+------------+---------+-----------+----------+-------+ Subclavian    Full       Yes       Yes                      +----------+------------+---------+-----------+----------+-------+  Left Findings: +----------+------------+---------+-----------+----------+-----------------+ LEFT      CompressiblePhasicitySpontaneousProperties     Summary      +----------+------------+---------+-----------+----------+-----------------+ IJV           Full       Yes       Yes                                +----------+------------+---------+-----------+----------+-----------------+ Subclavian    Full       Yes       Yes                                +----------+------------+---------+-----------+----------+-----------------+ Axillary      Full       Yes       Yes                                 +----------+------------+---------+-----------+----------+-----------------+ Brachial      Full       Yes       Yes                                +----------+------------+---------+-----------+----------+-----------------+ Radial        Full                                                    +----------+------------+---------+-----------+----------+-----------------+ Ulnar         Full                                                    +----------+------------+---------+-----------+----------+-----------------+  Cephalic      None                                  Age Indeterminate +----------+------------+---------+-----------+----------+-----------------+ Basilic       Full                                                    +----------+------------+---------+-----------+----------+-----------------+ Superficial vein thrombus in the left cephalic vein at the level of the mid upper arm extending into th proximal forearm.  Summary:  Right: No evidence of thrombosis in the subclavian.  Left: Findings consistent with age indeterminate superficial vein thrombosis involving the left cephalic vein.  *See table(s) above for measurements and observations.    Preliminary     Assessment/Plan: S/P Procedure(s) (LRB): REDO STERNOTOMY (N/A) Transesophageal Echocardiogram (Tee) (N/A) Evacuation Hematoma - Mediastinal Ligation of saphenous vein graft x two with Cardiopulmonary Bypass (N/A)  1 conts to improve POD#4 2 hemodyn stable, conts with sinus rhythm without recurrence of afib 3 sats good on RA 4 normal renal fxn 5 H/H improved 6 no fevers or leukocytosis 7 BS adeq controlled 8 conts with some volume overload, good UOP- cont lasix 9 CXR stable in appearance- cont pulm toilet 10 doing very well with ambulation, poss tx to 2 C 11 poss home in next 24-48 hours  LOS: 4 days    John Giovanni PA-C 09/29/2019 Pager [phone]- not for patient use  I have seen and examined  the patient and agree with the assessment and plan as outlined.  Doing well.  Will get ultrasound right groin to r/o pseudoaneurysm seen on CTA at time of admission.  Rexene Alberts, MD 09/29/2019 2:40 PM

## 2019-09-29 NOTE — Plan of Care (Signed)
  Problem: Cardiac: Goal: Will achieve and/or maintain hemodynamic stability Outcome: Progressing   Problem: Education: Goal: Knowledge of the prescribed therapeutic regimen will improve Outcome: Progressing   Problem: Education: Goal: Knowledge of disease or condition will improve Outcome: Progressing

## 2019-09-29 NOTE — Progress Notes (Signed)
Right pseudoaneurysm evaluation has been completed. Preliminary results can be found in CV Proc through chart review.   09/29/19 4:14 PM Jeffrey Tucker RVT

## 2019-09-30 ENCOUNTER — Telehealth: Payer: Self-pay | Admitting: *Deleted

## 2019-09-30 ENCOUNTER — Encounter (HOSPITAL_COMMUNITY): Payer: Self-pay | Admitting: Cardiology

## 2019-09-30 ENCOUNTER — Other Ambulatory Visit: Payer: Self-pay | Admitting: Cardiothoracic Surgery

## 2019-09-30 DIAGNOSIS — I729 Aneurysm of unspecified site: Secondary | ICD-10-CM

## 2019-09-30 DIAGNOSIS — Z953 Presence of xenogenic heart valve: Secondary | ICD-10-CM

## 2019-09-30 DIAGNOSIS — I2510 Atherosclerotic heart disease of native coronary artery without angina pectoris: Secondary | ICD-10-CM

## 2019-09-30 DIAGNOSIS — I314 Cardiac tamponade: Secondary | ICD-10-CM

## 2019-09-30 NOTE — Telephone Encounter (Signed)
[employer] to ship a 30 day cardiac event monitor to the patients home.  Instructions reviewed briefly as they are included in the monitor kit. 

## 2019-09-30 NOTE — Progress Notes (Signed)
CARDIAC REHAB PHASE I   Came to walk with pt, lunch tray recently delivered. Pt getting to 1000 on IS. Plans to walk with pt around 1300.   [id number] Rufina Falco, RN BSN 09/30/2019 11:56 AM

## 2019-09-30 NOTE — Progress Notes (Addendum)
      [address]       [place] [address]             [phone]        5 Days Post-Op Procedure(s) (LRB): REDO STERNOTOMY (N/A) Transesophageal Echocardiogram (Tee) (N/A) Evacuation Hematoma - Mediastinal Ligation of saphenous vein graft x two with Cardiopulmonary Bypass (N/A)  Subjective: Patient had a "coughing spell-non productive" last evening. He sat more upright and it stopped. His stools are somewhat loose as well.  Objective: Vital signs in last 24 hours: Temp:  [97.5 F (36.4 C)-99 F (37.2 C)] 98.1 F (36.7 C) (11/04 0418) Pulse Rate:  [84-96] 84 (11/04 0418) Cardiac Rhythm: Normal sinus rhythm (11/04 0702) Resp:  [16-20] 16 (11/04 0418) BP: (107-131)/(80-89) 131/86 (11/04 0418) SpO2:  [96 %-100 %] 96 % (11/04 0418) Weight:  [68.5 kg] 68.5 kg (11/04 0500)  Pre op weight 65.8 kg Current Weight  09/30/19 68.5 kg       Intake/Output from previous day: 11/03 0701 - 11/04 0700 In: 520 [P.O.:520] Out: 1740 [Urine:1740]   Physical Exam:  Cardiovascular: RRR, no murmur Pulmonary: Clear to auscultation bilaterally Abdomen: Soft, non tender, bowel sounds present. Extremities: No lower extremity edema. Wounds: Clean and dry.  No erythema or signs of infection. Middle chest tube wound open;Suture was undone so I tied it.  Lab Results: CBC: Recent Labs    09/28/19 0307 09/29/19 0344  WBC 9.3 9.8  HGB 9.4* 9.6*  HCT 28.9* 30.0*  PLT 213 313   BMET:  Recent Labs    09/28/19 0307 09/29/19 0344  NA 137 137  K 3.7 4.3  CL 107 100  CO2 23 24  GLUCOSE 71 95  BUN 14 12  CREATININE 0.76 0.73  CALCIUM 7.7* 7.9*    PT/INR:  Lab Results  Component Value Date   INR 1.8 (H) 09/25/2019   INR 1.5 (H) 09/25/2019   ABG:  INR: Will add last result for INR, ABG once components are confirmed Will add last 4 CBG results once components are confirmed  Assessment/Plan:  1. CV - SR with HR in the 70's. On Amiodarone 200 mg daily, ec  asa 81 mg daily 2.  Pulmonary - On room air. Encourage incentive spirometer 3. Hypothyroidism-continue Levothyroxine 125 mcg daily 4.  Acute blood loss anemia - H and H yesterday stable at 9.6 and 30. Continue oral ferrous 5. Right pseudoaneurysm seen on CTA (upon admission)- [place] yesterday showed an area with well defined borders measuring 2.4 cm x 1.7 cm was visualized arising off of the Common femoral artery with ultrasound characteristics of a pseudoaneurysm. As discussed with Dr. Roxy Manns, will cardiology to evaluate (possible decompression) 6. Stop stool softeners  Donielle M ZimmermanPA-C 09/30/2019,7:21 AM    I have seen and examined the patient and agree with the assessment and plan as outlined.  Will ask Cardiology team to assess and determine whether or not post-cath pseudoaneurysm should be compressed or observed.  Rexene Alberts, MD 09/30/2019 8:09 AM

## 2019-09-30 NOTE — Progress Notes (Addendum)
CARDIAC REHAB PHASE I   PRE:  Rate/Rhythm: 71 SR  BP:  Sitting: 118/88      SaO2: 97 RA  MODE:  Ambulation: for 25 min straight  POST:  Rate/Rhythm: 93 SR  BP:  Sitting: 133/88    SaO2: 96 RA  Pt ambulated for 37mins in hallway standby assist without a device with a steady gait. Pt able to walk and talk throughout the walk, slight SOB upon return to room. Encouraged continued ambulation, but stressed importance of not overdoing it. Pt states he is feeling much better and has a "ferocious" appetite. Will continue to follow. Pt requesting Dr. Roxy Manns call wife before d/c to answer some questions.  PO:[phone] Rufina Falco, RN BSN 09/30/2019 1:41 PM

## 2019-09-30 NOTE — Plan of Care (Signed)
  Problem: Education: Goal: Knowledge of disease or condition will improve Outcome: Progressing Goal: Knowledge of the prescribed therapeutic regimen will improve Outcome: Progressing   Problem: Cardiac: Goal: Will achieve and/or maintain hemodynamic stability Outcome: Progressing   Problem: Respiratory: Goal: Respiratory status will improve Outcome: Progressing   Problem: Urinary Elimination: Goal: Ability to achieve and maintain adequate renal perfusion and functioning will improve Outcome: Progressing

## 2019-09-30 NOTE — Consult Note (Addendum)
Cardiology Consultation:   Patient ID: Jeffrey Tucker MRN: [id number]; DOB: July 04, 1956  Admit date: 09/25/2019 Date of Consult: 09/30/2019  Primary Care Provider: Venia Carbon, MD Primary Cardiologist: Ida Rogue, MD  Primary Electrophysiologist:  None    Patient Profile:   Jeffrey Tucker is a 63 y.o. male with a hx of PMH of CAD s/p CABG x 2 (SVG-OM, SVG-PDA 08/27/19), AAA, bicuspid aortic valve with severe valvular regrugitation s/p bentall procedure, pulmonary hypertension, former smoker, asthma, HTN, HLD, hypothyroidism, and BPH and return visit with dressler's syndrome who is being seen today for the evaluation of possible pseudoaneurysm at the request of Dr. Roxy Manns.  History of Present Illness:   Jeffrey Tucker with complex recent hx.  He has hx bicuspid aortic valve, AAA, CAD, HTN, pulmonary HTN, former smoker, asthma, HLD, hypothyroidism and underwent biological Bentall aortic root replacement and CABG X 2 08/26/09.  BENTALL PROCEDURE with a 27 mm KONECT Resilia Aortic Valved Conduit. (N/A) CORONARY ARTERY BYPASS GRAFTING  SVG->OM, SVG->PDA.   He was discharged on day 7.  Readmitted 09/16/19  With chest pain, dressler's syndrome and tachycardia.   CT scan and transthoracic echocardiogram revealed slight increase in the amount of pericardial effusion and/or blood in the mediastinum.  That admit he did have PAF and converted quickly to SR  and treated with amiodarone, but not started on oral anticoagulation.     He was discharged 09/24/19 feeling good.  Plan for outpt monitor to eval for further a fib.   The night of discharge he had bloody drainage from his sternal wound.   Then his wife found unresponsive, she performed CPR,  EMS called and brought to [facility] , he was unresponsive on arrival.    EKG with diffuse ST segment changes, suspicious for inf. Wall ischemia.   CT angio was done, pt intubated, and placed on Vent.    CT withactive aortic extravasation with enlarging pericardial  effusion. The extravasation occurs adjacent to the origin of a reimplanted coronary artery which is somewhat diminutive on the current exam. It previously coursed along the posterior interventricular septum. This may represent breakdown of the reimplantation site.  Right upper lobe pulmonary arterial emboli new from the prior study.  Pseudoaneurysm arising from the right common femoral artery. This is new from a prior exam from March of 2020.  Pericardial tamponade with massive hemopericardium and possible ongoing bleeding having recently undergone biological Bentall aortic root replacement and coronary artery bypass grafting x2.  Patient suffered out of hospital cardiac arrest with unclear amount of downtime and remains in shock in ER  His only meaningful chance for survival is emergent surgical exploration.    He went emergently to OR.  Ligation of Saphenous Vein Grafts x2 using Cardiopulmonary Bypass.  Evacuation of Mediastinal Hematoma. Emergency Redo Median Sternotomy  Now Post op day 5.    He has no Rt groin pain, he had no pain with walking prior to this admit.     Heart Pathway Score:     Past Medical History:  Diagnosis Date   Pericardial tamponade 09/25/2019    Past Surgical History:  Procedure Laterality Date   HEMATOMA EVACUATION N/A 09/25/2019   Procedure: Evacuation Hematoma - Mediastinal Ligation of saphenous vein graft x two with Cardiopulmonary Bypass;  Surgeon: Rexene Alberts, MD;  Location: [facility] OR;  Service: Thoracic;  Laterality: N/A;   STERNOTOMY N/A 09/25/2019   Procedure: REDO STERNOTOMY;  Surgeon: Rexene Alberts, MD;  Location: [place];  Service:  Thoracic;  Laterality: N/A;   TEE WITHOUT CARDIOVERSION N/A 09/25/2019   Procedure: Transesophageal Echocardiogram (Tee);  Surgeon: Rexene Alberts, MD;  Location: [facility] OR;  Service: Thoracic;  Laterality: N/A;     Home Medications:  Prior to Admission medications   Medication Sig Start Date End Date Taking?  Authorizing Provider  albuterol (VENTOLIN HFA) 108 (90 Base) MCG/ACT inhaler Inhale 2 puffs into the lungs 3 (three) times daily as needed for wheezing or shortness of breath. 04/28/19  Yes [provider]  amiodarone (PACERONE) 200 MG tablet Take 200 mg by mouth daily.   Yes [provider]  aspirin EC 81 MG tablet Take 81 mg by mouth daily.   Yes [provider]  atorvastatin (LIPITOR) 80 MG tablet Take 80 mg by mouth at bedtime.  09/02/19  Yes [provider]  benzonatate (TESSALON) 100 MG capsule Take 100 mg by mouth 3 (three) times daily.   Yes [provider]  cetirizine (ZYRTEC) 10 MG tablet Take 10 mg by mouth daily.   Yes [provider]  colchicine 0.6 MG tablet Take 0.6 mg by mouth 2 (two) times daily.   Yes [provider]  ferrous sulfate 325 (65 FE) MG EC tablet Take 325 mg by mouth daily with breakfast.   Yes [provider]  Fluticasone-Salmeterol (ADVAIR) 100-50 MCG/DOSE AEPB Inhale 1 puff into the lungs 2 (two) times daily.   Yes [provider]  Glucosamine 500 MG CAPS Take 500 mg by mouth daily.   Yes [provider]  levothyroxine (SYNTHROID) 125 MCG tablet Take 125 mcg by mouth daily. 08/14/19  Yes [provider]  pantoprazole (PROTONIX) 40 MG tablet Take 40 mg by mouth 2 (two) times daily.   Yes [provider]  polyethylene glycol (MIRALAX / GLYCOLAX) 17 g packet Take 17 g by mouth daily as needed for mild constipation.   Yes [provider]    Inpatient Medications: Scheduled Meds:  acetaminophen (TYLENOL) oral liquid 160 mg/5 mL  1,000 mg Oral Q6H   Or   acetaminophen  1,000 mg Oral Q6H   amiodarone  200 mg Oral Daily   aspirin EC  81 mg Oral Daily   atorvastatin  80 mg Oral QHS   Chlorhexidine Gluconate Cloth  6 each Topical Daily   ferrous sulfate  325 mg Oral Q lunch   levothyroxine  125 mcg Oral QAC breakfast   pantoprazole  40 mg Oral BID    sodium chloride flush  3 mL Intravenous Q12H   Continuous Infusions:  sodium chloride     lactated ringers Stopped (09/26/19 0936)   PRN Meds: metoprolol tartrate, morphine injection, ondansetron (ZOFRAN) IV, oxyCODONE, sodium chloride flush, traMADol  Allergies:    Allergies  Allergen Reactions   Tetracyclines & Related Other (See Comments)    Joint pain   Metoprolol Tartrate    Amlodipine Other (See Comments)    10mg  dose causes lower extremity swelling. Pt can tolerate 5mg  dose.    Social History:   Social History   Socioeconomic History   Marital status: Married    Spouse name: Not on file   Number of children: Not on file   Years of education: Not on file   Highest education level: Not on file  Occupational History   Not on file  Social Needs   Financial resource strain: Not on file   Food insecurity    Worry: Not on file    Inability: Not on  file   Transportation needs    Medical: Not on file    Non-medical: Not on file  Tobacco Use   Smoking status: Not on file  Substance and Sexual Activity   Alcohol use: Not on file   Drug use: Not on file   Sexual activity: Not on file  Lifestyle   Physical activity    Days per week: Not on file    Minutes per session: Not on file   Stress: Not on file  Relationships   Social connections    Talks on phone: Not on file    Gets together: Not on file    Attends religious service: Not on file    Active member of club or organization: Not on file    Attends meetings of clubs or organizations: Not on file    Relationship status: Not on file   Intimate partner violence    Fear of current or ex partner: Not on file    Emotionally abused: Not on file    Physically abused: Not on file    Forced sexual activity: Not on file  Other Topics Concern   Not on file  Social History Narrative   Not on file    Family History:    Family History  Problem Relation Age of Onset   Alcohol abuse  Mother    Asthma Mother    Alcohol abuse Father      ROS:  Please see the history of present illness. Hx of ASTHMA  General:no colds or fevers, no weight changes Skin:no rashes or ulcers HEENT:no blurred vision, no congestion CV:see HPI PUL:see HPI GI:no recent diarrhea no constipation or melena, no indigestion GU:no hematuria, no dysuria MS:no joint pain, no claudication Neuro:no syncope, no lightheadedness Endo:no diabetes, + thyroid disease  All other ROS reviewed and negative.     Physical Exam/Data:   Vitals:   09/30/19 0500 09/30/19 0822 09/30/19 1149 09/30/19 1553  BP:  126/77 (!) 144/104 122/87  Pulse:  83 83 84  Resp:      Temp:  98.4 F (36.9 C) 98 F (36.7 C) 98.6 F (37 C)  TempSrc:  Oral Oral Oral  SpO2:      Weight: 68.5 kg     Height:        Intake/Output Summary (Last 24 hours) at 09/30/2019 1600 Last data filed at 09/30/2019 1200 Gross per 24 hour  Intake 420 ml  Output --  Net 420 ml   Last 3 Weights 09/30/2019 09/29/2019 09/28/2019  Weight (lbs) 151 lb 0.2 oz 157 lb 13.6 oz 169 lb 1.5 oz  Weight (kg) 68.5 kg 71.6 kg 76.7 kg     Body mass index is 22.96 kg/m.  Per Dr,. Inocencia Murtaugh  General:  Well nourished, well developed, in no acute distress, sitting up in bed on computer. HEENT: normal Lymph: no adenopathy Neck: no JVD Endocrine:  No thryomegaly Vascular: No carotid bruits; pedal pulses 2+ bilaterally without bruits  Cardiac:  normal S1, S2; RRR; + murmur no gallup rub or click, Rt groin per Dr. Claiborne Billings Lungs:  clear to auscultation bilaterally, no wheezing, rhonchi or rales  Abd: soft, nontender, no hepatomegaly  Ext: no edema Musculoskeletal:  No deformities, BUE and BLE strength normal and equal Skin: warm and dry  Neuro:  CNs 2-12 intact, no focal abnormalities noted Psych:  Normal affect   EKG:  The EKG was personally reviewed and demonstrates:  ST with LVH prolonged Qtc and T wave inversion I,II,  V4-6  Increased from Oct 26 Telemetry:   Telemetry was personally reviewed and demonstrates:  SR  Relevant CV Studies: Echo 09/17/19  IMPRESSIONS    1. Left ventricular ejection fraction, by visual estimation, is 45 to 50%. The left ventricle has mildly decreased function. There is moderately increased left ventricular hypertrophy.  2. Indeterminate diastolic filling due to E-A fusion pattern of LV diastolic filling.  3. Inferior and inferoseptal hypokinesis  4. Global right ventricle has moderately reduced systolic function.The right ventricular size is small. No increase in right ventricular wall thickness.  5. Hypokinetic right ventricular apex.  6. RV small -apperas to be extrinsically compressed by an echogeneic fluid collection which is anterior and lateral to the RV.  7. Left atrial size was normal.  8. Right atrial size was normal.  9. The tricuspid valve is abnormal. Tricuspid valve regurgitation is mild. 10. Prosthetic aortic graft in the ascending aorta position. 11. Aortic valve regurgitation was not visualized by color flow Doppler. 12. Aortic valve peak gradient measures 13.1 mmHg. 13. Aortic valve mean gradient measures 7.0 mmHg. 14. 27 mm Konect Resilia Aortic Valve Conduit. 15. The pulmonic valve was grossly normal. Pulmonic valve regurgitation is not visualized by color flow Doppler. 16. The mitral valve is grossly normal. Trace mitral valve regurgitation. 17. The inferior vena cava is dilated in size with <50% respiratory variability, suggesting right atrial pressure of 15 mmHg. 18. Trivial pericardial effusion is present. 19. No evidence of any gross valvular vegetations.  FINDINGS  Left Ventricle: Left ventricular ejection fraction, by visual estimation, is 45 to 50%. The left ventricle has mildly decreased function. There is moderately increased left ventricular hypertrophy. Spectral Doppler shows Indeterminate diastolic filling  due to E-A fusion pattern of LV diastolic filling.  Right Ventricle:  The right ventricular size is small. No increase in right ventricular wall thickness. Global RV systolic function is has moderately reduced systolic function. The motion of the right ventricle apex hypokinetic. RV small -apperas to be  extrinsically compressed by an echogeneic fluid collection which is anterior and lateral to the RV.  Left Atrium: Left atrial size was normal in size.  Right Atrium: Right atrial size was normal in size  Pericardium: Trivial pericardial effusion is present. There is a small pleural effusion in the left lateral region.  Mitral Valve: The mitral valve is grossly normal. Trace mitral valve regurgitation.  Tricuspid Valve: The tricuspid valve is abnormal. Tricuspid valve regurgitation is mild by color flow Doppler.  Aortic Valve: The aortic valve has been repaired/replaced. Aortic valve regurgitation was not visualized by color flow Doppler. Aortic valve mean gradient measures 7.0 mmHg. Aortic valve peak gradient measures 13.1 mmHg. Aortic valve area, by VTI  measures 1.36 cm. Bioprosthetic aortic valve valve is present in the aortic position.  Pulmonic Valve: The pulmonic valve was grossly normal. Pulmonic valve regurgitation is not visualized by color flow Doppler.  Aorta: The aortic root and ascending aorta are structurally normal, with no evidence of dilitation. There is a prosthetic graft seen in the position of the ascending aorta. 27 mm Konect Resilia Aortic Valve Conduit.  Venous: The inferior vena cava is dilated in size with less than 50% respiratory variability, suggesting right atrial pressure of 15 mmHg.  IAS/Shunts: No atrial level shunt detected by color flow Doppler.  Groin lower ext art doppler of Rt groin with Findings:  An area with well defined borders measuring 2.4 cm x 1.7 cm was visualized arising off of the Common femoral artery  with ultrasound characteristics of a pseudoaneurysm.  Laboratory Data:  High Sensitivity  Troponin:   Recent Labs  Lab 09/25/19 0141 09/25/19 0340  TROPONINIHS 267* 482*     Chemistry Recent Labs  Lab 09/27/19 0231 09/28/19 0307 09/29/19 0344  NA 136 137 137  K 3.6 3.7 4.3  CL 107 107 100  CO2 21* 23 24  GLUCOSE 87 71 95  BUN 15 14 12   CREATININE 0.92 0.76 0.73  CALCIUM 7.9* 7.7* 7.9*  GFRNONAA >60 >60 >60  GFRAA >60 >60 >60  ANIONGAP 8 7 13     No results for input(s): PROT, ALBUMIN, AST, ALT, ALKPHOS, BILITOT in the last 168 hours. Hematology Recent Labs  Lab 09/27/19 0231 09/28/19 0307 09/29/19 0344  WBC 12.5* 9.3 9.8  RBC 2.99* 3.19* 3.22*  HGB 9.0* 9.4* 9.6*  HCT 26.7* 28.9* 30.0*  MCV 89.3 90.6 93.2  MCH 30.1 29.5 29.8  MCHC 33.7 32.5 32.0  RDW 16.6* 16.7* 16.7*  PLT 169 213 313   BNPNo results for input(s): BNP, PROBNP in the last 168 hours.  DDimer No results for input(s): DDIMER in the last 168 hours.   Radiology/Studies:  Dg Chest 2 View  Result Date: 09/29/2019 CLINICAL DATA:  Heart surgery. EXAM: CHEST - 2 VIEW COMPARISON:  10/16/2019. FINDINGS: Interim removal of bilateral chest tubes and mediastinal drainage catheter. No pneumothorax. Prior cardiac valve replacement. Cardiomegaly. Bibasilar atelectasis/infiltrates and small bilateral pleural effusions again noted. Stable left upper lobe pleural-parenchymal thickening most consistent scarring. No acute bony abnormality. Surgical clips right upper chest and right upper quadrant. IMPRESSION: 1. Interim removal of bilateral chest tubes and mediastinal drainage catheters. 2. Bibasilar atelectasis/infiltrates and bilateral pleural effusions, unchanged. Stable left upper lobe pleural-parenchymal thickening consistent scarring. 3. Prior cardiac valve replacement. Stable cardiomegaly. No pulmonary venous congestion. Electronically Signed   By: Marcello Moores  Register   On: 09/29/2019 05:48   Dg Chest Port 1 View  Result Date: 09/28/2019 CLINICAL DATA:  Chest tube. EXAM: PORTABLE CHEST 1 VIEW COMPARISON:   September 27, 2019 FINDINGS: Bilateral chest tubes and a mediastinal drain are again noted. The heart size is stable from prior study. The patient is status post prior median sternotomy. There is no pneumothorax. There are small bilateral pleural effusions with adjacent airspace opacities. There is no acute displaced fracture. Mild vascular congestion is noted. IMPRESSION: Stable appearance of the chest with no evidence for pneumothorax. Electronically Signed   By: Constance Holster M.D.   On: 09/28/2019 05:55   Dg Chest Port 1 View  Result Date: 09/27/2019 CLINICAL DATA:  Atelectasis EXAM: PORTABLE CHEST 1 VIEW COMPARISON:  Chest x-rays dated 09/26/2019 and 09/25/2019. FINDINGS: Bilateral chest tubes are stable in position. Stable bibasilar atelectasis, LEFT slightly greater than RIGHT. Probable small bilateral pleural effusions. No new lung findings. Heart size and mediastinal contours are stable. No pneumothorax is seen. IMPRESSION: 1. Stable chest x-ray. Bilateral chest tubes are stable in position. No pneumothorax. 2. Stable bibasilar atelectasis, LEFT slightly greater than RIGHT. Electronically Signed   By: Franki Cabot M.D.   On: 09/27/2019 08:16   Vas [place] Groin Pseudoaneurysm  Result Date: 09/29/2019  ARTERIAL PSEUDOANEURYSM  Exam: Right groin Indications: Patient complains of palpable knot. History: S/p catheterization. Comparison Study: 09/25/2019 - CTA Chest Performing Technologist: Oliver Hum RVT  Examination Guidelines: A complete evaluation includes B-mode imaging, spectral Doppler, color Doppler, and power Doppler as needed of all accessible portions of each vessel. Bilateral testing is considered an integral part of a complete  examination. Limited examinations for reoccurring indications may be performed as noted. +------------+----------+---------+------+----------+  Right Duplex PSV (cm/s) Waveform  Plaque Comment(s)  +------------+----------+---------+------+----------+  CFA                      triphasic                    +------------+----------+---------+------+----------+ Triphasic flow is noted in the posterior tibial, peroneal, and anterior tibial arteries.  Findings: An area with well defined borders measuring 2.4 cm x 1.7 cm was visualized arising off of the Common femoral artery with ultrasound characteristics of a pseudoaneurysm.  Diagnosing physician: Harold Barban MD Electronically signed by Harold Barban MD on 09/29/2019 at 5:39:06 PM.   --------------------------------------------------------------------------------    Final    Vas [place] Upper Extremity Venous Duplex  Result Date: 09/29/2019 UPPER VENOUS STUDY  Indications: Edema Other Indications: S/P Redo Sternotomy on 09/25/19. Comparison Study: No priors. Performing Technologist: Oda Cogan RDMS, RVT  Examination Guidelines: A complete evaluation includes B-mode imaging, spectral Doppler, color Doppler, and power Doppler as needed of all accessible portions of each vessel. Bilateral testing is considered an integral part of a complete examination. Limited examinations for reoccurring indications may be performed as noted.  Right Findings: +----------+------------+---------+-----------+----------+-------+  RIGHT      Compressible Phasicity Spontaneous Properties Summary  +----------+------------+---------+-----------+----------+-------+  Subclavian     Full        Yes        Yes                         +----------+------------+---------+-----------+----------+-------+  Left Findings: +----------+------------+---------+-----------+----------+-----------------+  LEFT       Compressible Phasicity Spontaneous Properties      Summary       +----------+------------+---------+-----------+----------+-----------------+  IJV            Full        Yes        Yes                                   +----------+------------+---------+-----------+----------+-----------------+  Subclavian     Full        Yes        Yes                                    +----------+------------+---------+-----------+----------+-----------------+  Axillary       Full        Yes        Yes                                   +----------+------------+---------+-----------+----------+-----------------+  Brachial       Full        Yes        Yes                                   +----------+------------+---------+-----------+----------+-----------------+  Radial         Full                                                         +----------+------------+---------+-----------+----------+-----------------+  Ulnar          Full                                                         +----------+------------+---------+-----------+----------+-----------------+  Cephalic       None                                      Age Indeterminate  +----------+------------+---------+-----------+----------+-----------------+  Basilic        Full                                                         +----------+------------+---------+-----------+----------+-----------------+ Superficial vein thrombus in the left cephalic vein at the level of the mid upper arm extending into th proximal forearm.  Summary:  Right: No evidence of thrombosis in the subclavian.  Left: Findings consistent with age indeterminate superficial vein thrombosis involving the left cephalic vein.  *See table(s) above for measurements and observations.  Diagnosing physician: Ruta Hinds MD Electronically signed by Ruta Hinds MD on 09/29/2019 at 9:45:18 AM.    Final     Assessment and Plan:   1. Rt common femoral artery pseudoaneurysm, presumed from cath on 08/21/19.  Found with CTA of chest and abd. Measuring 2.4 cm x 1.7 cm  Dr. Fletcher Anon has reviewed - neck is wide.  Would ask vascular to consult for their opinion due to width of neck.  Not sure if can be compressed and may be too wide for thrombin.  Dr. Claiborne Billings has seen.  2. 5 days post op for redo sternotomy evacuation hematoma, Mediastinal Ligation of saphenous vein  graft x two with Cardiopulmonary Bypass (N/A) (acute episode with CPR) 3. Brief a fib 09/17/19 on amiodarone 200 mg daily, on ASA , maintaining SR- such brief episode no anticoagulation to have event monitor arranged as outpt.  4. S/p biological Bentall aortic root replacement and coronary artery bypass grafting x2 on August 27, 2019 by Dr. Orvan Seen  5. CAD to LCX and RCA no angina.continue statin-lipitor 80 mg, ASA  Consider BB with loss of bypass grafts though on last admit Dr. Ellyn Hack was hesitant to add with with amiodarone and non-conducted PACs.   6. Acute blood loss anemia with hgb 9.6 on Iron po. Has rec'd 8 units PRBC      For questions or updates, please contact [place] Please consult [email or web address] for contact info under   Signed, Cecilie Kicks, NP  09/30/2019 4:00 PM    Patient seen and examined. Agree with assessment and plan.  I reviewed the patient's records and his prior surgery and recent emergent surgery.  We are asked to comment on the patient's right common femoral artery pseudoaneurysm at the site of his initial catheterization from August 21, 2019 which was incidentally found on his most recent CT scan.  The patient denies any pain in his right groin.  He denied any difficulty with walking.  Ultrasound was reviewed with Dr. Fletcher Anon.  The pseudoaneurysm measures 2.4 cm x 1.7 cm.  This aneurysm has been most likely present  since the catheterization and is now 43 weeks old reducing the likelihood of spontaneous closure.  However, there is concern regarding the width of the neck extending from the femoral artery which measures 0.424 cm.  According  to Dr. Fletcher Anon, the neck appears somewhat wide.  The presence of a wide neck can be associated with potential risk of thrombin entering  the femoral artery.  For this reason, Dr. Fletcher Anon has requested that the vascular surgeons also assess this risk and if is felt to be wide then surgical suture ligation may the preferred treatment.  If  they feel this can be closed with compression and thrombin injection then Dr. Fletcher Anon would be willing to schedule this to be done electively since the patient denies any recent pain or discomfort.  On exam his femoral pulse is wide with the presence of a bruit consistent with the pseudoaneurysm and with my palpable pressure he did not notice any significant discomfort.  Distal pulses in the right lower extremity were normal.  The patient is presently maintaining sinus rhythm on amiodarone.  With ligation of his vein grafts from surgery, consider low-dose metoprolol for potential future anti-ischemic benefit in light of his native CAD.  Troy Sine, MD, [facility] 09/30/2019 5:08 PM

## 2019-09-30 NOTE — Telephone Encounter (Signed)
Okay to order a monitor when he is ready

## 2019-10-01 ENCOUNTER — Inpatient Hospital Stay (HOSPITAL_COMMUNITY): Payer: PRIVATE HEALTH INSURANCE

## 2019-10-01 DIAGNOSIS — I724 Aneurysm of artery of lower extremity: Secondary | ICD-10-CM

## 2019-10-01 MED ORDER — MORPHINE SULFATE (PF) 2 MG/ML IV SOLN
INTRAVENOUS | Status: AC
  Filled 2019-10-01: qty 1

## 2019-10-01 MED ORDER — THROMBIN FOR PERCUTANEOUS TREATMENT OF PSEUDOANEURYSM (5000UNITS/10ML)
Freq: Once | PERCUTANEOUS | Status: DC
  Filled 2019-10-01: qty 1

## 2019-10-01 MED ORDER — POTASSIUM CHLORIDE CRYS ER 20 MEQ PO TBCR
20.0000 meq | EXTENDED_RELEASE_TABLET | Freq: Every day | ORAL | Status: DC
  Administered 2019-10-01 – 2019-10-02 (×2): 20 meq via ORAL
  Filled 2019-10-01 (×2): qty 1

## 2019-10-01 MED ORDER — CARVEDILOL 3.125 MG PO TABS
3.1250 mg | ORAL_TABLET | Freq: Two times a day (BID) | ORAL | Status: DC
  Administered 2019-10-01: 3.125 mg via ORAL
  Filled 2019-10-01: qty 1

## 2019-10-01 MED ORDER — FUROSEMIDE 40 MG PO TABS
40.0000 mg | ORAL_TABLET | Freq: Every day | ORAL | Status: DC
  Administered 2019-10-01: 40 mg via ORAL
  Filled 2019-10-01 (×2): qty 1

## 2019-10-01 MED ORDER — LIDOCAINE HCL (PF) 1 % IJ SOLN
INTRAMUSCULAR | Status: AC
  Filled 2019-10-01: qty 30

## 2019-10-01 MED ORDER — THROMBIN FOR PERCUTANEOUS TREATMENT OF PSEUDOANEURYSM (5000UNITS/10ML)
5000.0000 [IU] | Freq: Once | PERCUTANEOUS | Status: DC
  Filled 2019-10-01: qty 1

## 2019-10-01 NOTE — Progress Notes (Signed)
Pt c/o pain on right side when taking deep breath, he states that this happened before and he had pleural effusion. Notified tessa conte pa, new orders rec'd for CXR,

## 2019-10-01 NOTE — Progress Notes (Signed)
Paged tessa conte PA regarding pt having spasms in abdomen. Says this happened with the lopressor. Orders rec'd to d/c coreg. Will reevaluate in am.

## 2019-10-01 NOTE — Progress Notes (Addendum)
[address]ley BeachSuite [address]       [place] [address]             [phone]      6 Days Post-Op Procedure(s) (LRB): REDO STERNOTOMY (N/A) Transesophageal Echocardiogram (Tee) (N/A) Evacuation Hematoma - Mediastinal Ligation of saphenous vein graft x two with Cardiopulmonary Bypass (N/A) Subjective: Feels pretty well, concerned about pseudo-aneurysm and what the plan is.  Objective: Vital signs in last 24 hours: Temp:  [98 F (36.7 C)-98.8 F (37.1 C)] 98.7 F (37.1 C) (11/05 0342) Pulse Rate:  [83-84] 84 (11/04 1553) Cardiac Rhythm: Heart block (11/05 0702) Resp:  [16-19] 16 (11/05 0342) BP: (122-144)/(77-104) 131/88 (11/05 0342) SpO2:  [96 %] 96 % (11/04 1954) Weight:  [69.4 kg] 69.4 kg (11/05 0422)  Hemodynamic parameters for last 24 hours:    Intake/Output from previous day: 11/04 0701 - 11/05 0700 In: 420 [P.O.:420] Out: -  Intake/Output this shift: No intake/output data recorded.  General appearance: alert, cooperative and no distress Heart: regular rate and rhythm and soft systolic murmur Lungs: mildly dim in bases Abdomen: benign Extremities: + edema left LE Wound: incis healing well  Lab Results: Recent Labs    09/29/19 0344  WBC 9.8  HGB 9.6*  HCT 30.0*  PLT 313   BMET:  Recent Labs    09/29/19 0344  NA 137  K 4.3  CL 100  CO2 24  GLUCOSE 95  BUN 12  CREATININE 0.73  CALCIUM 7.9*    PT/INR: No results for input(s): LABPROT, INR in the last 72 hours. ABG    Component Value Date/Time   PHART 7.409 09/25/2019 1837   HCO3 19.0 (L) 09/25/2019 1837   TCO2 20 (L) 09/25/2019 1837   ACIDBASEDEF 5.0 (H) 09/25/2019 1837   O2SAT 97.0 09/25/2019 1837   CBG (last 3)  No results for input(s): GLUCAP in the last 72 hours.  Meds Scheduled Meds: . amiodarone  200 mg Oral Daily  . aspirin EC  81 mg Oral Daily  . atorvastatin  80 mg Oral QHS  . Chlorhexidine Gluconate Cloth  6 each Topical Daily  . ferrous sulfate  325 mg Oral Q  lunch  . levothyroxine  125 mcg Oral QAC breakfast  . pantoprazole  40 mg Oral BID  . sodium chloride flush  3 mL Intravenous Q12H   Continuous Infusions: . sodium chloride    . lactated ringers Stopped (09/26/19 0936)   PRN Meds:.metoprolol tartrate, morphine injection, ondansetron (ZOFRAN) IV, oxyCODONE, sodium chloride flush, traMADol  Xrays Vas [place] Groin Pseudoaneurysm  Result Date: 09/29/2019  ARTERIAL PSEUDOANEURYSM  Exam: Right groin Indications: Patient complains of palpable knot. History: S/p catheterization. Comparison Study: 09/25/2019 - CTA Chest Performing Technologist: Oliver Hum RVT  Examination Guidelines: A complete evaluation includes B-mode imaging, spectral Doppler, color Doppler, and power Doppler as needed of all accessible portions of each vessel. Bilateral testing is considered an integral part of a complete examination. Limited examinations for reoccurring indications may be performed as noted. +------------+----------+---------+------+----------+ Right DuplexPSV (cm/s)Waveform PlaqueComment(s) +------------+----------+---------+------+----------+ CFA                   triphasic                 +------------+----------+---------+------+----------+ Triphasic flow is noted in the posterior tibial, peroneal, and anterior tibial arteries.  Findings: An area with well defined borders measuring 2.4 cm x 1.7 cm was visualized arising off of the Common femoral artery with  ultrasound characteristics of a pseudoaneurysm.  Diagnosing physician: Harold Barban MD Electronically signed by Harold Barban MD on 09/29/2019 at 5:39:06 PM.   --------------------------------------------------------------------------------    Final     Assessment/Plan: S/P Procedure(s) (LRB): REDO STERNOTOMY (N/A) Transesophageal Echocardiogram (Tee) (N/A) Evacuation Hematoma - Mediastinal Ligation of saphenous vein graft x two with Cardiopulmonary Bypass (N/A)  1 doing well overall 2  hemodyn stable in sinus rhythm 3 Vasc surgeon to see re: right groin pseudoaneurysm options 4 L>R LE edema- will d/w MD 5 no new labs 6 home soon?   LOS: 6 days    [facility] 10/01/2019 Pager [phone]- not for patient use   I have seen and examined the patient and agree with the assessment and plan as outlined.  Await plan per Cardiology and Vascular Surgery.  Continue low dose lasix and potassium.  Consider adding low dose beta blocker or ACE-I but will defer to Cardiology team.  Patient denies knowing why he is listed as being allergic to metoprolol and his medical record has still not been merged with previous recent hospitalizations.  Otherwise ready for d/c home.   Rexene Alberts, MD 10/01/2019 9:25 AM

## 2019-10-01 NOTE — Progress Notes (Signed)
Right groin pseudoaneurysm compression has been completed. Preliminary results can be found in CV Proc through chart review.   10/01/19 12:21 PM Carlos Levering RVT

## 2019-10-01 NOTE — Progress Notes (Signed)
CARDIAC REHAB PHASE I   Came back to check on pt. Pt very pleased with results of procedure, states he is on bedrest til 6p. Pt continues to recount his journey and is grateful for the events that kept him alive. Pt hopeful for d/c tomorrow. Will f/u tomorrow and review d/c education.  [id number] Rufina Falco, RN BSN 10/01/2019 2:28 PM

## 2019-10-01 NOTE — Progress Notes (Signed)
Pharmacy note: allergy clarification  63 yo male with recent CABG and bentall.  He has a reported allergy to metoprolol and is unable to provide details.    I spoke with his wife today and she reported metoprolol was discontinued by Dr. Rockey Situ after Mr. Bouwman experienced abdominal spasms and cough. She stated that there was discussion that the reaction may have been associated with a specific manufacturer of metoprolol  A clinic note from 09/15/19 (from a diferent account; MRN [id number]) also mentions spasmatic cough/asthma possible from metoprolol and the medication was discontinued at that time.   Plan -Spoke with Dr. Claiborne Billings, will start carvedilol -May be best to avoid metoprolol in the future but is difficult to say whether this medication was the cause of the abdominal spasms and cough   Hildred Laser, PharmD Clinical Pharmacist **Pharmacist phone directory can now be found on [email or web address] ([email or web address]).  Listed under [place].

## 2019-10-01 NOTE — Progress Notes (Signed)
Right common femoral pseudoaneurysm measured about 2 cm x 1 cm.  After 30 minutes of ultrasound compression with vascular lab about 80 to 90% of the pseudoaneurysm is now thrombosed.  Remaining segment that has flow measures about 1 cm x 0.4 cm.  We will get another duplex tomorrow.  Would not thrombin inject the remaining segment given it sits right on the artery with no neck.  If persistent flow would be hopeful that this will thrombose over the next month or so.   Marty Heck, MD [facility] Office: [phone] Pager: [phone]

## 2019-10-01 NOTE — Consult Note (Addendum)
Hospital Consult    Reason for Consult: Right common femoral artery pseudoaneurysm Requesting Physician: Dr. Dorian Pod MRN #:  [id number]  History of Present Illness: This is a 63 y.o. male who is being seen in consultation for evaluation of right groin common femoral artery pseudoaneurysm.  He is 6 weeks status post cardiac catheterization via right groin approach.  Patient noted to have a knot in his right groin however is not painful for causing any discomfort otherwise.  He denies any rest pain or tissue changes right lower extremity.  Patient was readmitted for mediastinal hematoma evacuation.  Patient would like pseudoaneurysm fixed prior to discharge home.  By ultrasound pseudoaneurysm measures 2 x 1 cm with a short neck.  He is not on any blood thinners.  Past Medical History:  Diagnosis Date  . Pericardial tamponade 09/25/2019    Past Surgical History:  Procedure Laterality Date  . HEMATOMA EVACUATION N/A 09/25/2019   Procedure: Evacuation Hematoma - Mediastinal Ligation of saphenous vein graft x two with Cardiopulmonary Bypass;  Surgeon: Rexene Alberts, MD;  Location: [facility];  Service: Thoracic;  Laterality: N/A;  . STERNOTOMY N/A 09/25/2019   Procedure: REDO STERNOTOMY;  Surgeon: Rexene Alberts, MD;  Location: [facility];  Service: Thoracic;  Laterality: N/A;  . TEE WITHOUT CARDIOVERSION N/A 09/25/2019   Procedure: Transesophageal Echocardiogram (Tee);  Surgeon: Rexene Alberts, MD;  Location: [facility] OR;  Service: Thoracic;  Laterality: N/A;    Allergies  Allergen Reactions  . Tetracyclines & Related Other (See Comments)    Joint pain  . Metoprolol Tartrate   . Amlodipine Other (See Comments)    10mg  dose causes lower extremity swelling. Pt can tolerate 5mg  dose.    Prior to Admission medications   Medication Sig Start Date End Date Taking? Authorizing Provider  albuterol (VENTOLIN HFA) 108 (90 Base) MCG/ACT inhaler Inhale 2 puffs into the lungs 3 (three) times daily as needed  for wheezing or shortness of breath. 04/28/19  Yes [provider]  amiodarone (PACERONE) 200 MG tablet Take 200 mg by mouth daily.   Yes [provider]  aspirin EC 81 MG tablet Take 81 mg by mouth daily.   Yes [provider]  atorvastatin (LIPITOR) 80 MG tablet Take 80 mg by mouth at bedtime.  09/02/19  Yes [provider]  benzonatate (TESSALON) 100 MG capsule Take 100 mg by mouth 3 (three) times daily.   Yes [provider]  cetirizine (ZYRTEC) 10 MG tablet Take 10 mg by mouth daily.   Yes [provider]  colchicine 0.6 MG tablet Take 0.6 mg by mouth 2 (two) times daily.   Yes [provider]  ferrous sulfate 325 (65 FE) MG EC tablet Take 325 mg by mouth daily with breakfast.   Yes [provider]  Fluticasone-Salmeterol (ADVAIR) 100-50 MCG/DOSE AEPB Inhale 1 puff into the lungs 2 (two) times daily.   Yes [provider]  Glucosamine 500 MG CAPS Take 500 mg by mouth daily.   Yes [provider]  levothyroxine (SYNTHROID) 125 MCG tablet Take 125 mcg by mouth daily. 08/14/19  Yes [provider]  pantoprazole (PROTONIX) 40 MG tablet Take 40 mg by mouth 2 (two) times daily.   Yes [provider]  polyethylene glycol (MIRALAX / GLYCOLAX) 17 g packet Take 17 g by mouth daily as needed for mild constipation.   Yes [provider]    Social History   Socioeconomic History  . Marital status: Married  Spouse name: Not on file  . Number of children: Not on file  . Years of education: Not on file  . Highest education level: Not on file  Occupational History  . Not on file  Social Needs  . Financial resource strain: Not on file  . Food insecurity    Worry: Not on file    Inability: Not on file  . Transportation needs    Medical: Not on file    Non-medical: Not on file  Tobacco Use  . Smoking status: Not on file  Substance and Sexual Activity  . Alcohol use: Not on file  .  Drug use: Not on file  . Sexual activity: Not on file  Lifestyle  . Physical activity    Days per week: Not on file    Minutes per session: Not on file  . Stress: Not on file  Relationships  . Social Herbalist on phone: Not on file    Gets together: Not on file    Attends religious service: Not on file    Active member of club or organization: Not on file    Attends meetings of clubs or organizations: Not on file    Relationship status: Not on file  . Intimate partner violence    Fear of current or ex partner: Not on file    Emotionally abused: Not on file    Physically abused: Not on file    Forced sexual activity: Not on file  Other Topics Concern  . Not on file  Social History Narrative  . Not on file     Family History  Problem Relation Age of Onset  . Alcohol abuse Mother   . Asthma Mother   . Alcohol abuse Father     ROS: Otherwise negative unless mentioned in HPI  Physical Examination  Vitals:   10/01/19 0342 10/01/19 0831  BP: 131/88 130/82  Pulse:  80  Resp: 16 19  Temp: 98.7 F (37.1 C) 98.6 F (37 C)  SpO2:  99%   Body mass index is 23.26 kg/m.  General:  WDWN in NAD Gait: Not observed HENT: WNL, normocephalic Pulmonary: normal non-labored breathing, without Rales, rhonchi,  wheezing Cardiac: regular Abdomen:  soft, NT/ND, no masses Skin: without rashes Vascular Exam/Pulses: Palpable and symmetrical PT pulses Extremities: without ischemic changes, without Gangrene , without cellulitis; without open wounds;  Musculoskeletal: no muscle wasting or atrophy  Neurologic: A&O X 3 Psychiatric:  The pt has Normal affect. Lymph:  Unremarkable  CBC    Component Value Date/Time   WBC 9.8 09/29/2019 0344   RBC 3.22 (L) 09/29/2019 0344   HGB 9.6 (L) 09/29/2019 0344   HCT 30.0 (L) 09/29/2019 0344   PLT 313 09/29/2019 0344   MCV 93.2 09/29/2019 0344   MCH 29.8 09/29/2019 0344   MCHC 32.0 09/29/2019 0344   RDW 16.7 (H) 09/29/2019 0344     BMET    Component Value Date/Time   NA 137 09/29/2019 0344   K 4.3 09/29/2019 0344   CL 100 09/29/2019 0344   CO2 24 09/29/2019 0344   GLUCOSE 95 09/29/2019 0344   BUN 12 09/29/2019 0344   CREATININE 0.73 09/29/2019 0344   CALCIUM 7.9 (L) 09/29/2019 0344   GFRNONAA >60 09/29/2019 0344   GFRAA >60 09/29/2019 0344    COAGS: Lab Results  Component Value Date   INR 1.8 (H) 09/25/2019   INR 1.5 (H) 09/25/2019     ASSESSMENT/PLAN: This is a  63 y.o. male with 2 x 1 cm common femoral artery pseudoaneurysm after cardiac cath 6 weeks prior  - At this point it is unlikely for pseudoaneurysm to resolve on its own - Plan is for attempt at pseudoaneurysm compression; if unsuccessful and anatomy is favorable we can then try thrombin injection; risk of thrombin injection was discussed with the patient in this will be aborted if it is felt that the anatomy does not favor thrombin injection -If not amendable by compression or thrombin injection we can then discuss open repair in operating room -Consent ordered and all of patient's and patient's wife's questions were answered -We will proceed with attempted compression   Dagoberto Ligas PA-C [facility] [phone]  I have seen and evaluated the patient. I agree with the PA note as documented above.  63 year old male seen with small 1 x 2 cm false aneurysm off the right common femoral artery.  Fairly short neck.  He is not having any pain on exam.  Has a palpable femoral as well as pedal pulses.  I have reviewed his imaging and we will attempt ultrasound compression with vas lab in Cath Lab holding.  I may attempt thrombin injection if this does not work but will closely evaluate with [place] given short neck.  Discussed with patient and his wife that if all these fail we can make a decision about open repair in the OR.  This has been persistent for 6 weeks since his cardiac cath in [facility] via right femoral approach.   Marty Heck, MD [facility] Office: [phone] Pager: [phone]

## 2019-10-01 NOTE — Discharge Summary (Signed)
Physician Discharge Summary  Patient ID: JAMILE DEPETRIS MRN: [id number] DOB/AGE: [birth year] 63 y.o.  Admit date: 09/25/2019 Discharge date: 10/02/2019  Admission Diagnoses:Pericardial tamponade  Discharge Diagnoses:  Principal Problem:   Pericardial tamponade Active Problems:   Pleural effusion   Acute blood loss anemia   Cardiogenic shock (HCC)   Acute on chronic diastolic heart failure (HCC) Right femoral pseudoaneurysm  Patient Active Problem List   Diagnosis Date Noted  . Acute blood loss anemia   . Pericardial tamponade 09/25/2019  . Pleural effusion 09/25/2019  . Cardiogenic shock ([place]) 09/25/2019  . Acute on chronic diastolic heart failure ([place]) 09/25/2019  Right femoral pseudoaneurysm H/O severe aortic regurgitation/Root dilation Hypothyroidism Hypertriglyceridemia Renal stones CAD BPH Asthma   HPI: at time of consultation  Patient is a 63 year old male with history of bicuspid aortic valve, ascending aortic aneurysm, coronary artery disease, hypertension, pulmonary hypertension, former smoker, asthma, hyperlipidemia, hypothyroidism, and BPH who underwent biological Bentall aortic root replacement and coronary artery bypass grafting x2 on August 27, 2019 by Dr. Orvan Seen.  He was discharged from the hospital on the seventh postoperative day.  He was readmitted to the hospital September 16, 2019 with severe chest pain and tachycardia.  CT scan and transthoracic echocardiogram revealed slight increase in the amount of pericardial effusion and/or blood in the mediastinum.  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  During his hospitalization he had a brief episode of atrial fibrillation for which he was treated with amiodarone.  He promptly converted to sinus rhythm and was not started on oral anticoagulation.  On the morning of September 23, 2019 had a brief episode of hypotension which resolved without significant intervention.  He was discharged from the  hospital yesterday.  Overnight the patient began to develop bloody drainage from his sternal wound.  According to his wife he otherwise felt well until early this morning she suddenly found him unresponsive.  She moved him to the floor and began CPR.  EMS was called and brought him directly to the emergency room where he was unresponsive on arrival.  EKG revealed diffuse ST segment changes suspicious for inferior wall ischemia.  Both cardiology and cardiothoracic surgical consultations were requested.  At my urging the patient was sent for stat CT angiogram.  Upon my arrival to the emergency department CT angiogram had just been completed and the patient had been intubated and placed on mechanical ventilation. He was seen urgently by Dr Roxy Manns and was taken urgently to the Operating Room for exploration and repair.  Discharged Condition: good  Hospital Course: Patient was admitted to the emergency department and taken promptly to the operating room where he underwent the below described procedure.  He tolerated it well was taken to the surgical intensive care unit in stable condition.  Postoperative hospital course:  The patient has overall done well.  He has maintained stable hemodynamics.  He was weaned from the ventilator without difficulty after his emergent surgery.  All routine lines, monitors and drainage devices have been discontinued in the standard fashion.  Oxygen has been weaned and he maintains good saturations on room air.  His incisions are healing well without evidence of infection.  He was noted on initial CT scan to have a right femoral pseudoaneurysm and vascular surgical consultation was obtained.  This is felt to be chronic from his cardiac catheterization.  He underwent ultrasound compression for 30 minutes on 11 5 by Dr. Carlis Abbott with about 90% thrombosis.  A repeat duplex on 11 6  showed it did reopen and it is felt that he will require potential surgical repair as an outpatient.  He will  follow up with Dr. Carlis Abbott in this regard.  His hemoglobin and hematocrit are stable at 9.6 and 30 respectively.  His renal function is within normal limits and he makes good urine output.  He is tolerating routine activities commensurate for level of postoperative convalescence.  His blood sugars are under good control.  He is not having any fevers or leukocytosis.  At the time of discharge the patient is felt to be quite stable.   Consults: cardiology and vascular surgery  Significant Diagnostic Studies: Chest CT scan , TEE, vascular lab   Treatments: surgery:  CARDIOTHORACIC SURGERY OPERATIVE NOTE  Date of Procedure:                09/25/2019  Preoperative Diagnosis:        Pericardial Tamponade he is doing well home tomorrow  Postoperative Diagnosis:      Pericardial Tamponade  Procedure:        Emergency Redo Median Sternotomy  Evacuation of Mediastinal Hematoma  Ligation of Saphenous Vein Grafts x2 using Cardiopulmonary Bypass                          Surgeon:        Valentina Gu. Roxy Manns, MD  Assistant:       Nani Skillern, PA-C  Anesthesia:    Laurie Panda, MD  Operative Findings: ? Massive hemopericardium with old clot and acute hemorrhage ? Active bleeding from proximal anastomosis of both saphenous vein grafts   Discharge Exam: Blood pressure 116/86, pulse 95, temperature 99.1 F (37.3 C), temperature source Oral, resp. rate 14, height 5\' 8"  (1.727 m), weight 68 kg, SpO2 92 %.  General appearance: alert, cooperative and no distress Heart: regular rate and rhythm Lungs: clear to auscultation bilaterally Abdomen: benign Extremities: edema a little better bilat Wound: incis healing well  Disposition: Discharge disposition: 01-Home or Self Care       Discharge Instructions    Discharge patient   Complete by: As directed    Discharge disposition: 01-Home or Self Care   Discharge patient date: 10/02/2019     Allergies as of 10/02/2019       Reactions   Tetracyclines & Related Other (See Comments)   Joint pain   Beta Adrenergic Blockers    Patient experienced abdomen vasospasms with Metoprolol succinate/tartrate and carvedilol.   Metoprolol Tartrate    Asthma and cough   Amlodipine Other (See Comments)   10mg  dose causes lower extremity swelling. Pt can tolerate 5mg  dose.      Medication List    STOP taking these medications   colchicine 0.6 MG tablet     TAKE these medications   albuterol 108 (90 Base) MCG/ACT inhaler Commonly known as: VENTOLIN HFA Inhale 2 puffs into the lungs 3 (three) times daily as needed for wheezing or shortness of breath.   amiodarone 200 MG tablet Commonly known as: PACERONE Take 200 mg by mouth daily.   aspirin EC 81 MG tablet Take 81 mg by mouth daily.   atorvastatin 80 MG tablet Commonly known as: LIPITOR Take 80 mg by mouth at bedtime.   benzonatate 100 MG capsule Commonly known as: TESSALON Take 100 mg by mouth 3 (three) times daily.   cetirizine 10 MG tablet Commonly known as: ZYRTEC Take 10 mg by mouth daily.   ferrous  sulfate 325 (65 FE) MG EC tablet Take 325 mg by mouth daily with breakfast.   Fluticasone-Salmeterol 100-50 MCG/DOSE Aepb Commonly known as: ADVAIR Inhale 1 puff into the lungs 2 (two) times daily.   furosemide 40 MG tablet Commonly known as: LASIX Take 1 tablet (40 mg total) by mouth daily. Start taking on: October 03, 2019   Glucosamine 500 MG Caps Take 500 mg by mouth daily.   levothyroxine 125 MCG tablet Commonly known as: SYNTHROID Take 125 mcg by mouth daily.   pantoprazole 40 MG tablet Commonly known as: PROTONIX Take 40 mg by mouth 2 (two) times daily.   polyethylene glycol 17 g packet Commonly known as: MIRALAX / GLYCOLAX Take 17 g by mouth daily as needed for mild constipation.   potassium chloride SA 20 MEQ tablet Commonly known as: KLOR-CON Take 1 tablet (20 mEq total) by mouth daily. Start taking on: October 03, 2019    traMADol 50 MG tablet Commonly known as: ULTRAM Take 1 tablet (50 mg total) by mouth every 6 (six) hours as needed for up to 7 days for moderate pain.      Follow-up Information    Rexene Alberts, MD Follow up.   Specialty: Cardiothoracic Surgery Why: Please see discharge paperwork for follow-up appointment with Dr. Roxy Manns.  Obtain a chest x-ray at [facility] 1/2-hour prior to this appointment.  It is located in the same office complex. Contact information: [address] [place]   [address] [phone]        Minna Merritts, MD Follow up.   Specialty: Cardiology Why: as previously scheduled Contact information: [place] [address] [phone]           Signed: Gaspar Bidding 10/02/2019, 1:14 PM

## 2019-10-01 NOTE — Progress Notes (Signed)
CARDIAC REHAB PHASE I   Pt currently heading to cath lab. Walking independently today without issue. Will follow-up after procedure to check with pt.  Rufina Falco, RN BSN 10/01/2019 10:58 AM

## 2019-10-01 NOTE — Plan of Care (Signed)
  Problem: Education: Goal: Knowledge of disease or condition will improve Outcome: Progressing Goal: Knowledge of the prescribed therapeutic regimen will improve Outcome: Progressing   Problem: Cardiac: Goal: Will achieve and/or maintain hemodynamic stability Outcome: Progressing   Problem: Respiratory: Goal: Respiratory status will improve Outcome: Progressing   Problem: Urinary Elimination: Goal: Ability to achieve and maintain adequate renal perfusion and functioning will improve Outcome: Progressing

## 2019-10-01 NOTE — Discharge Instructions (Signed)
Coronary Artery Bypass Grafting, Care After This sheet gives you information about how to care for yourself after your procedure. Your doctor may also give you more specific instructions. If you have problems or questions, call your doctor. What can I expect after the procedure? After the procedure, it is common to:  Feel sick to your stomach (nauseous).  Not want to eat as much as normal (lack of appetite).  Have trouble pooping (constipation).  Have weakness and tiredness (fatigue).  Feel sad (depressed) or grouchy (irritable).  Have pain or discomfort around the cuts from surgery (incisions). Follow these instructions at home: Medicines  Take over-the-counter and prescription medicines only as told by your doctor. Do not stop taking medicines or start any new medicines unless your doctor says it is okay.  If you were prescribed an antibiotic medicine, take it as told by your doctor. Do not stop taking the antibiotic even if you start to feel better. Incision care   Follow instructions from your doctor about how to take care of your cuts from surgery. Make sure you: ? Wash your hands with soap and water before and after you change your bandage (dressing). If you cannot use soap and water, use hand sanitizer. ? Change your bandage as told by your doctor. ? Leave stitches (sutures), skin glue, or skin tape (adhesive) strips in place. They may need to stay in place for 2 weeks or longer. If tape strips get loose and curl up, you may trim the loose edges. Do not remove tape strips completely unless your doctor says it is okay.  Make sure the surgery cuts are clean, dry, and protected.  Check your cut areas every day for signs of infection. Check for: ? More redness, swelling, or pain. ? More fluid or blood. ? Warmth. ? Pus or a bad smell.  If cuts were made in your legs: ? Avoid crossing your legs. ? Avoid sitting for long periods of time. Change positions every 30  minutes. ? Raise (elevate) your legs when you are sitting. Bathing  Do not take baths, swim, or use a hot tub until your doctor says it is okay.  You may take a shower. Pat the surgery cuts dry. Do not rub the cuts to dry.  Ask your doctor when you can shower. Eating and drinking   Eat foods that are high in fiber, such as beans, nuts, whole grains, and raw fruits and vegetables. Any meats you eat should be lean cut. Avoid canned, processed, and fried foods. This can help prevent trouble pooping. This is also a part of a heart-healthy diet.  Drink enough fluid to keep your pee (urine) pale yellow.  Do not drink alcohol until you are fully recovered. Ask your doctor when it is safe to drink alcohol. Activity  Rest and limit your activity as told by your doctor. You may be told to: ? Stop any activity right away if you have chest pain, shortness of breath, irregular heartbeats, or dizziness. Get help right away if you have any of these symptoms. ? Move around often for short periods or take short walks as told by your doctor. Slowly increase your activities. ? Avoid lifting, pushing, or pulling anything that is heavier than 10 lb (4.5 kg) for at least 6 weeks or as told by your doctor.  Do physical therapy or a cardiac rehab (cardiac rehabilitation) program as told by your doctor. ? Physical therapy involves doing exercises to maintain movement and build strength and  endurance. ? A cardiac rehab program includes:  Exercise training.  Education.  Counseling.  Do not drive until your doctor says it is okay.  Ask your doctor when you can go back to work.  Ask your doctor when you can be sexually active. General instructions  Do not drive or use heavy machinery while taking prescription pain medicine.  Do not use any products that contain nicotine or tobacco. These include cigarettes, e-cigarettes, and chewing tobacco. If you need help quitting, ask your doctor.  Take 2-3 deep  breaths every few hours during the day while you get better. This helps expand your lungs and prevent problems.  If you were given a device called an incentive spirometer, use it several times a day to practice deep breathing. Support your chest with a pillow or your arms when you take deep breaths or cough.  Wear compression stockings as told by your doctor.  Weigh yourself every day. This helps to see if your body is holding (retaining) fluid that may make your heart and lungs work harder.  Keep all follow-up visits as told by your doctor. This is important. Contact a doctor if:  You have more redness, swelling, or pain around any cut.  You have more fluid or blood coming from any cut.  Any cut feels warm to the touch.  You have pus or a bad smell coming from any cut.  You have a fever.  You have swelling in your ankles or legs.  You have pain in your legs.  You gain 2 lb (0.9 kg) or more a day.  You feel sick to your stomach or you throw up (vomit).  You have watery poop (diarrhea). Get help right away if:  You have chest pain that goes to your jaw or arms.  You are short of breath.  You have a fast or irregular heartbeat.  You notice a "clicking" in your breastbone (sternum) when you move.  You have any signs of a stroke. "BE FAST" is an easy way to remember the main warning signs: ? B - Balance. Signs are dizziness, sudden trouble walking, or loss of balance. ? E - Eyes. Signs are trouble seeing or a change in how you see. ? F - Face. Signs are sudden weakness or loss of feeling of the face, or the face or eyelid drooping on one side. ? A - Arms. Signs are weakness or loss of feeling in an arm. This happens suddenly and usually on one side of the body. ? S - Speech. Signs are sudden trouble speaking, slurred speech, or trouble understanding what people say. ? T - Time. Time to call emergency services. Write down what time symptoms started.  You have other signs of  a stroke, such as: ? A sudden, very bad headache with no known cause. ? Feeling sick to your stomach. ? Throwing up. ? Jerky movements you cannot control (seizure). These symptoms may be an emergency. Do not wait to see if the symptoms will go away. Get medical help right away. Call your local emergency services (911 in the [place] Do not drive yourself to the hospital. Summary  After the procedure, it is common to have pain or discomfort in the cuts from surgery (incisions).  Do not take baths, swim, or use a hot tub until your doctor says it is okay.  Slowly increase your activities. You may need physical therapy or cardiac rehab.  Weigh yourself every day. This helps to see if your  body is holding fluid. This information is not intended to replace advice given to you by your health care provider. Make sure you discuss any questions you have with your health care provider. Document Released: 11/17/2013 Document Revised: 07/22/2018 Document Reviewed: 07/22/2018 Elsevier Patient Education  2020 [employer].

## 2019-10-02 ENCOUNTER — Ambulatory Visit: Payer: Self-pay | Admitting: Cardiothoracic Surgery

## 2019-10-02 ENCOUNTER — Inpatient Hospital Stay (HOSPITAL_COMMUNITY): Payer: PRIVATE HEALTH INSURANCE

## 2019-10-02 DIAGNOSIS — I724 Aneurysm of artery of lower extremity: Secondary | ICD-10-CM

## 2019-10-02 MED ORDER — TRAMADOL HCL 50 MG PO TABS: 50.0000 mg | ORAL_TABLET | Freq: Four times a day (QID) | ORAL | 0 refills | Status: DC | PRN

## 2019-10-02 MED ORDER — POTASSIUM CHLORIDE CRYS ER 20 MEQ PO TBCR: 20.0000 meq | EXTENDED_RELEASE_TABLET | Freq: Every day | ORAL | 0 refills | Status: DC

## 2019-10-02 MED ORDER — FUROSEMIDE 40 MG PO TABS: 40.0000 mg | ORAL_TABLET | Freq: Every day | ORAL | 0 refills | Status: DC

## 2019-10-02 MED FILL — FUROSEMIDE 40 MG TABLET: 40 | 7 days supply | Qty: 7 | Fill #0

## 2019-10-02 MED FILL — traMADol HCL 50 MG TABS: 50 | 7 days supply | Qty: 28 | Fill #0

## 2019-10-02 MED FILL — POTASSIUM CL ER 20 MEQ TABL: 20 | 7 days supply | Qty: 7 | Fill #0

## 2019-10-02 MED FILL — Cefuroxime Sodium For Inj 750 MG: INTRAMUSCULAR | Qty: 750 | Status: AC

## 2019-10-02 MED FILL — Lidocaine HCl Local Preservative Free (PF) Inj 2%: INTRAMUSCULAR | Qty: 15 | Status: AC

## 2019-10-02 MED FILL — Heparin Sodium (Porcine) Inj 1000 Unit/ML: INTRAMUSCULAR | Qty: 30 | Status: AC

## 2019-10-02 MED FILL — Vancomycin HCl For IV Soln 1 GM (Base Equivalent): INTRAVENOUS | Qty: 1000 | Status: AC

## 2019-10-02 MED FILL — Heparin Sodium (Porcine) Inj 1000 Unit/ML: INTRAMUSCULAR | Qty: 2500 | Status: AC

## 2019-10-02 MED FILL — Potassium Chloride Inj 2 mEq/ML: INTRAVENOUS | Qty: 20 | Status: AC

## 2019-10-02 MED FILL — Sodium Chloride IV Soln 0.9%: INTRAVENOUS | Qty: 4000 | Status: AC

## 2019-10-02 MED FILL — Electrolyte-R (PH 7.4) Solution: INTRAVENOUS | Qty: 1000 | Status: AC

## 2019-10-02 MED FILL — Sodium Bicarbonate IV Soln 8.4%: INTRAVENOUS | Qty: 100 | Status: AC

## 2019-10-02 NOTE — Progress Notes (Addendum)
[address]       [place] [id number]             [phone]      7 Days Post-Op Procedure(s) (LRB): REDO STERNOTOMY (N/A) Transesophageal Echocardiogram (Tee) (N/A) Evacuation Hematoma - Mediastinal Ligation of saphenous vein graft x two with Cardiopulmonary Bypass (N/A) Subjective: Feels pretty well, no new c/o  Objective: Vital signs in last 24 hours: Temp:  [98.6 F (37 C)-99 F (37.2 C)] 98.6 F (37 C) (11/06 0403) Pulse Rate:  [79-90] 90 (11/05 2246) Cardiac Rhythm: Normal sinus rhythm (11/06 0704) Resp:  [14-20] 18 (11/06 0403) BP: (115-132)/(79-98) 124/87 (11/06 0403) SpO2:  [96 %-99 %] 99 % (11/05 2246) Weight:  [68 kg] 68 kg (11/06 0604)  Hemodynamic parameters for last 24 hours:    Intake/Output from previous day: 11/05 0701 - 11/06 0700 In: 720 [P.O.:720] Out: 875 [Urine:875] Intake/Output this shift: No intake/output data recorded.  General appearance: alert, cooperative and no distress Heart: regular rate and rhythm Lungs: clear to auscultation bilaterally Abdomen: benign Extremities: edema a little better bilat Wound: incis healing well  Lab Results: No results for input(s): WBC, HGB, HCT, PLT in the last 72 hours. BMET: No results for input(s): NA, K, CL, CO2, GLUCOSE, BUN, CREATININE, CALCIUM in the last 72 hours.  PT/INR: No results for input(s): LABPROT, INR in the last 72 hours. ABG    Component Value Date/Time   PHART 7.409 09/25/2019 1837   HCO3 19.0 (L) 09/25/2019 1837   TCO2 20 (L) 09/25/2019 1837   ACIDBASEDEF 5.0 (H) 09/25/2019 1837   O2SAT 97.0 09/25/2019 1837   CBG (last 3)  No results for input(s): GLUCAP in the last 72 hours.  Meds Scheduled Meds:  amiodarone  200 mg Oral Daily   aspirin EC  81 mg Oral Daily   atorvastatin  80 mg Oral QHS   Chlorhexidine Gluconate Cloth  6 each Topical Daily   ferrous sulfate  325 mg Oral Q lunch   furosemide  40 mg Oral Daily   levothyroxine  125 mcg  Oral QAC breakfast   pantoprazole  40 mg Oral BID   potassium chloride  20 mEq Oral Daily   sodium chloride flush  3 mL Intravenous Q12H   Continuous Infusions:  sodium chloride     lactated ringers Stopped (09/26/19 0936)   PRN Meds:.metoprolol tartrate, morphine injection, ondansetron (ZOFRAN) IV, oxyCODONE, sodium chloride flush, traMADol  Xrays Dg Chest Port 1 View  Result Date: 10/01/2019 CLINICAL DATA:  RIGHT chest pain. EXAM: PORTABLE CHEST 1 VIEW COMPARISON:  09/29/2019 and prior radiographs FINDINGS: Cardiomegaly and cardiac valve replacement again noted. No significant changes noted in the mediastinal silhouette. A small LEFT pleural effusion and trace RIGHT pleural effusion noted with bibasilar opacities/atelectasis again identified. No evidence of pneumothorax. IMPRESSION: 1. Unchanged chest radiograph with small LEFT pleural effusion and trace RIGHT pleural effusion with bibasilar opacities/atelectasis. 2. Cardiomegaly without significant change in cardiomediastinal silhouette Electronically Signed   By: Margarette Canada M.D.   On: 10/01/2019 18:30   Vas [place] Groin Pseudoaneurysm  Result Date: 10/01/2019  ARTERIAL PSEUDOANEURYSM  Exam: Right groin Indications: Patient complains of palpable knot. Performing Technologist: Oliver Hum RVT  Examination Guidelines: A complete evaluation includes B-mode imaging, spectral Doppler, color Doppler, and power Doppler as needed of all accessible portions of each vessel. Bilateral testing is considered an integral part of a complete examination. Limited examinations for reoccurring indications may be performed as  noted. +------------+----------+---------+------+----------+  Right Duplex PSV (cm/s) Waveform  Plaque Comment(s)  +------------+----------+---------+------+----------+  CFA                     triphasic                    +------------+----------+---------+------+----------+ Triphasic waveforms were noted in the posterior tibial,  peroneal, and anterior tibial arteries before and after 30 minutes of compression.  Findings: An area with well defined borders was visualized arising off of the Common femoral artery with ultrasound characteristics of a pseudoaneurysm. After 30 minutes of compression, 80-90% of the pseudoaneurysm is thrombosed. Residual pseudoaneurysm measures 1.3 cm long by 0.5 cm high.  Diagnosing physician: Monica Martinez MD Electronically signed by Monica Martinez MD on 10/01/2019 at 3:34:30 PM.   --------------------------------------------------------------------------------    Final     Assessment/Plan: S/P Procedure(s) (LRB): REDO STERNOTOMY (N/A) Transesophageal Echocardiogram (Tee) (N/A) Evacuation Hematoma - Mediastinal Ligation of saphenous vein graft x two with Cardiopulmonary Bypass (N/A)   1 doing well, hemodyn stable in sinus rhythm 2 sats fine on RA 3 no new labs or xrays 4 plans per vasc surgery noted to repeat study today 5 poss home later today   LOS: 7 days    John Giovanni [facility] 10/02/2019 Pager [phone]  I have seen and examined the patient and agree with the assessment and plan as outlined.  Rexene Alberts, MD 10/02/2019 7:44 AM

## 2019-10-02 NOTE — Progress Notes (Signed)
Right pseudoaneurysm duplex completed. Refer to "CV Proc" under chart review to view preliminary results.  10/02/2019 10:16 AM Kelby Aline., MHA, RVT, RDCS, RDMS

## 2019-10-02 NOTE — Plan of Care (Signed)
  Problem: Education: Goal: Knowledge of disease or condition will improve Outcome: Progressing Goal: Knowledge of the prescribed therapeutic regimen will improve Outcome: Progressing   Problem: Cardiac: Goal: Will achieve and/or maintain hemodynamic stability Outcome: Progressing   Problem: Respiratory: Goal: Respiratory status will improve Outcome: Progressing   Problem: Urinary Elimination: Goal: Ability to achieve and maintain adequate renal perfusion and functioning will improve Outcome: Progressing

## 2019-10-02 NOTE — Progress Notes (Signed)
Patient provided with verbal discharge instructions. Paper copy given to patient. No further questions by either wife or patient. RN attempted to reach medical records at the request of pt and family to have records merged. RN not able to directly reach anyone in medical records. Pt and wife provided with both MSN numbers for patient as well as phone number to reach medical records to have pt record merged. Prescriptions brought to bedside by TOC.  VSS at discharge. Sutures removed per orders. IV's removed. Belongings taken by patient. Patient d/c by tech via wheelchair to [employer]	[place] entrance to private vehicle

## 2019-10-02 NOTE — Progress Notes (Signed)
CARDIAC REHAB PHASE I   Reviewed d/c ed with pt and wife. Encouraged continued walks, IS use and sternal precautions. Reviewed site care, restrictions and exercise guidelines. Pt and wife anxiously awaiting results from [place].   [id number] Rufina Falco, RN BSN 10/02/2019 11:16 AM

## 2019-10-02 NOTE — Progress Notes (Signed)
Pt very anxious this morning. Requesting to find out how long d/c would take. Spoke with PA. Advise patient and family of timeframe for d/c. Will continue to monitor.

## 2019-10-02 NOTE — TOC Transition Note (Addendum)
Transition of Care [facility]) - CM/SW Discharge Note   Patient Details  Name: Jeffrey Tucker MRN: [id number] Date of Birth: [birth year]  [facility] CM/SW Contact:  Zenon Mayo, RN Phone Number: 10/02/2019, 9:58 AM   Clinical Narrative:    Patient for dc today, TOC to fill his medications today.  NCM assisted patient with Match Letter for 3.00 each.  Patient will have transportation at dc.  He would like to keep his PCP.   Final next level of care: Home/Self Care Barriers to Discharge: No Barriers Identified   Patient Goals and CMS Choice Patient states their goals for this hospitalization and ongoing recovery are:: to return to a normal life   Choice offered to / list presented to : NA  Discharge Placement                       Discharge Plan and Services                DME Arranged: (NA)         HH Arranged: NA          Social Determinants of Health (SDOH) Interventions     Readmission Risk Interventions No flowsheet data found.

## 2019-10-02 NOTE — Progress Notes (Signed)
[facility]  Subjective  -no pain in groin.   Objective 116/86 95 99.1 F (37.3 C) (Oral) 14 92%  Intake/Output Summary (Last 24 hours) at 10/02/2019 1243 Last data filed at 10/02/2019 0820 Gross per 24 hour  Intake 600 ml  Output 725 ml  Net -125 ml    Right common femoral pulse palpable Right DP PT palpable Small palpable knot in right groin  Laboratory Lab Results: No results for input(s): WBC, HGB, HCT, PLT in the last 72 hours. BMET No results for input(s): NA, K, CL, CO2, GLUCOSE, BUN, CREATININE, CALCIUM in the last 72 hours.  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  Ultimately vascular surgery was asked to evaluate this yesterday.  He underwent ultrasound compression for 30 minutes and had about 90% thrombosis.  We repeated a duplex today and part of the pseudoaneurysm has reopened.  That being said it still very small.  Only measures about 1 x 2 cm.  He is completely asymptomatic.  Discussed with him and his wife that I am comfortable with him going home.  I will plan to see him a week from Tuesday in clinic with a repeat duplex.  I do not think this is injectable given no neck and would not inject thrombin.  Discussed we can make a decision about elective repair in clinic if he wants to have this repaired in the operating room.  Okay for discharge from my standpoint.  Marty Heck 10/02/2019 12:43 PM --

## 2019-10-05 ENCOUNTER — Telehealth: Payer: Self-pay

## 2019-10-05 ENCOUNTER — Ambulatory Visit (INDEPENDENT_AMBULATORY_CARE_PROVIDER_SITE_OTHER): Payer: PRIVATE HEALTH INSURANCE

## 2019-10-05 ENCOUNTER — Encounter (HOSPITAL_COMMUNITY): Payer: Self-pay | Admitting: *Deleted

## 2019-10-05 DIAGNOSIS — I480 Paroxysmal atrial fibrillation: Secondary | ICD-10-CM | POA: Diagnosis not present

## 2019-10-05 NOTE — Telephone Encounter (Signed)
Attempted to schedule.  Patient declined sooner with APP .  Added to waitlist will call if we can see sooner than scheduled.

## 2019-10-05 NOTE — Telephone Encounter (Signed)
Patient contacted regarding discharge from [facility] on 11/6.  Patient understands to follow up with provider TBD at [facility]. Patient understands discharge instructions? {yes  Patient understands medications and regiment? {yes  Patient understands to bring all medications to this visit? {yes  Pt has f/u for CT surgery 11/30 with Dr. Roxy Manns (see 2nd chart [id number])  His wife makes the appts, pt reports she will call us to schedule f/u with Dr. Rockey Situ. Pt has monitor. Will activate at home.

## 2019-10-05 NOTE — Telephone Encounter (Signed)
Called patient to check on how he was doing. Patient states he is doing as good as he can be, doing better. His spirits are higher. He states he was told at the hospital that they fixed everything this time and hopefully will not have any more issues. Patient is trying to just do what he was told at the hospital, taking medications, walking, etc. No concerns or issues at this time. Patient has follow up appointment on 10/09/2019 with Dr Silvio Pate.

## 2019-10-06 ENCOUNTER — Encounter: Payer: Self-pay | Admitting: Cardiovascular Disease

## 2019-10-06 ENCOUNTER — Ambulatory Visit (INDEPENDENT_AMBULATORY_CARE_PROVIDER_SITE_OTHER): Payer: Self-pay | Admitting: Cardiovascular Disease

## 2019-10-06 ENCOUNTER — Other Ambulatory Visit: Payer: Self-pay

## 2019-10-06 VITALS — BP 128/80 | HR 82 | Ht 66.0 in | Wt 148.0 lb

## 2019-10-06 DIAGNOSIS — I480 Paroxysmal atrial fibrillation: Secondary | ICD-10-CM

## 2019-10-06 DIAGNOSIS — I25118 Atherosclerotic heart disease of native coronary artery with other forms of angina pectoris: Secondary | ICD-10-CM

## 2019-10-06 DIAGNOSIS — Z953 Presence of xenogenic heart valve: Secondary | ICD-10-CM

## 2019-10-06 NOTE — Progress Notes (Signed)
Cardiology Office Note  Date:  10/06/2019   ID:  Jeffrey Tucker, DOB Jun 29, 1956, MRN [id number]  PCP:  Venia Carbon, MD   Chief Complaint  Patient presents with  . Other    hospital follow up. Patient states he is doing good! Meds reviewed verbally with patient.     HPI:  Jeffrey Tucker is a very pleasant 63 year old gentleman with  moderate aortic valve regurgitation, mild to moderately dilated aortic root 4.1 cm  ascending aorta 4.2 cm, Echo 2020: There is dilatation of the aortic root 4.9 cm and of the ascending aorta 4.4 cm, aortic arch 3.7 cm.  possible bicuspid aortic valve.  He presents for follow-up after aortic valve replacement aortic root replacement, CABG, readmission for bleed  Details of recent hospitalization reviewed with him  biological Bentall aortic root replacement and coronary artery bypass grafting x2 on August 27, 2019 by Dr. Orvan Seen.   discharged from the hospital on the seventh postoperative day.  readmitted to the hospital September 16, 2019 with severe chest pain and tachycardia. CT scan and transthoracic echocardiogram revealed slight increase in the amount of pericardial effusion and/or blood in the mediastinum.  brief episode of atrial fibrillation , treated with amiodarone. September 23, 2019 had a brief episode of hypotension which resolved without significant intervention.   develop bloody drainage from his sternal wound. According to his wife he otherwise felt well until early this morning she suddenly found him unresponsive. She moved him to the floor and began CPR. EMS was called and brought him directly to the emergency room where he was unresponsive on arrival  intubated and placed on mechanical ventilation. He was seen urgently by Dr Roxy Manns and was taken urgently to the Operating Room for exploration and repair.  CT scan to have a right femoral pseudoaneurysm and vascular surgical consultation was obtained.  This is felt to be chronic from his  cardiac catheterization.   pseudoaneurysm measures 2 x 1 cm with a short neck.  will require potential surgical repair as an outpatient.  Restarted metoprolol Having some spasm in ABD,cough Stopped metoprolol 11/6 Sat/sund lots of sx in his abdomen, seems to have tapered off yesterday and today  Wife who presents with him today reports he looks much better today Lab work discussed with him, hematocrit appears to be trending upward slowly He is on iron  EKG personally reviewed by myself on todays visit Shows normal sinus rhythm rate 82 bpm ST-T wave abnormality V4 through V6, 1 and aVL   PMH:   has a past medical history of Allergy, Aortic root dilatation ([place]), Asthma, Bicuspid aortic valve, BPH (benign prostatic hypertrophy), CAD (coronary artery disease), GERD (gastroesophageal reflux disease), History of kidney stones, Hypertension, Hypertriglyceridemia, Hypothyroidism, Pericardial tamponade (09/25/2019), and Severe aortic regurgitation.  PSH:    Past Surgical History:  Procedure Laterality Date  . BENTALL PROCEDURE N/A 08/27/2019   Procedure: BENTALL PROCEDURE with a 27 mm KONECT Resilia Aortic Valved Conduit and 30 mm x 30 cm Hemashield Platinum straight graft.;  Surgeon: Wonda Olds, MD;  [facility];  Service: Open Heart Surgery;  Laterality: N/A;  . CARDIAC CATHETERIZATION  2006   [place]  . CARDIOVASCULAR STRESS TEST  2006   [place]  . CHOLECYSTECTOMY  2006  . CORONARY ARTERY BYPASS GRAFT N/A 08/27/2019   Procedure: CORONARY ARTERY BYPASS GRAFTING (CABG) times 2 using right saphenous endoscopic vein harvesting to OM2 nad PDA.;  Surgeon: Wonda Olds, MD;  [facility];  Service:  Open Heart Surgery;  Laterality: N/A;  . CYSTOSCOPY W/ URETERAL STENT PLACEMENT  05/24/2017   Procedure: left;  Surgeon: Nickie Retort, MD;  Location: [facility];  Service: Urology;;  . Consuela Mimes W/ URETERAL STENT PLACEMENT Left 06/11/2017   Procedure: CYSTOSCOPY WITH STENT  REPLACEMENT;  Surgeon: Hollice Espy, MD;  Location: [facility];  Service: Urology;  Laterality: Left;  . CYSTOSCOPY WITH BIOPSY Left 06/11/2017   Procedure: RENAL PELVIS TUMOR WITH BIOPSY WITH POSSIBLE LASER ABLATION;  Surgeon: Hollice Espy, MD;  Location: [facility];  Service: Urology;  Laterality: Left;  . CYSTOSCOPY WITH STENT PLACEMENT Left 05/24/2017   Procedure: , cystoscopy,left ureteral stent placement and left ureteroscopy attempted, retrograde pylogram;  Surgeon: Nickie Retort, MD;  Location: [facility];  Service: Urology;  Laterality: Left;  . EYE SURGERY Bilateral    Cataract Extraction with IOL  . HEMATOMA EVACUATION N/A 09/25/2019   Procedure: Evacuation Hematoma - Mediastinal Ligation of saphenous vein graft x two with Cardiopulmonary Bypass;  Surgeon: Rexene Alberts, MD;  Location: [place];  Service: Thoracic;  Laterality: N/A;  . IR THORACENTESIS ASP PLEURAL SPACE W/IMG GUIDE  09/02/2019  . RIGHT/LEFT HEART CATH AND CORONARY ANGIOGRAPHY N/A 08/21/2019   Procedure: RIGHT/LEFT HEART CATH AND CORONARY ANGIOGRAPHY;  Surgeon: Minna Merritts, MD;  Location: [place] [facility];  Service: Cardiovascular;  Laterality: N/A;  . STERNOTOMY N/A 09/25/2019   Procedure: REDO STERNOTOMY;  Surgeon: Rexene Alberts, MD;  Location: [place];  Service: Thoracic;  Laterality: N/A;  . TEE WITHOUT CARDIOVERSION N/A 08/27/2019   Procedure: TRANSESOPHAGEAL ECHOCARDIOGRAM (TEE);  Surgeon: Wonda Olds, MD;  Location: [place];  Service: Open Heart Surgery;  Laterality: N/A;  . TEE WITHOUT CARDIOVERSION N/A 09/25/2019   Procedure: Transesophageal Echocardiogram (Tee);  Surgeon: Rexene Alberts, MD;  Location: [place];  Service: Thoracic;  Laterality: N/A;  . URETEROSCOPY WITH HOLMIUM LASER LITHOTRIPSY Left 06/11/2017   Procedure: URETEROSCOPY WITH HOLMIUM LASER LITHOTRIPSY;  Surgeon: Hollice Espy, MD;  Location: [facility];  Service: Urology;  Laterality: Left;  Marland Kitchen VEIN LIGATION AND STRIPPING Bilateral  1997    Current Outpatient Medications  Medication Sig Dispense Refill  . acetaminophen (TYLENOL) 325 MG tablet Take 2 tablets (650 mg total) by mouth every 6 (six) hours as needed for mild pain.    Marland Kitchen albuterol (VENTOLIN HFA) 108 (90 Base) MCG/ACT inhaler Inhale 2 puffs into the lungs 3 (three) times daily as needed for wheezing or shortness of breath.    Marland Kitchen amiodarone (PACERONE) 200 MG tablet Take 1 tablet (200 mg total) by mouth daily. 30 tablet 0  . aspirin EC 81 MG EC tablet Take 1 tablet (81 mg total) by mouth daily. 30 tablet 0  . atorvastatin (LIPITOR) 80 MG tablet Take 1 tablet (80 mg total) by mouth daily at 6 PM. 30 tablet 0  . benzonatate (TESSALON PERLES) 100 MG capsule Take 1 capsule (100 mg total) by mouth 3 (three) times daily. 30 capsule 2  . cetirizine (ZYRTEC) 10 MG tablet Take 10 mg by mouth daily.    . ferrous sulfate 325 (65 FE) MG EC tablet Take 325 mg by mouth daily with breakfast.    . Fluticasone-Salmeterol (ADVAIR) 100-50 MCG/DOSE AEPB Inhale 1 puff into the lungs 2 (two) times daily. 60 each 11  . furosemide (LASIX) 40 MG tablet Take 1 tablet (40 mg total) by mouth daily. 7 tablet 0  . Glucosamine 500 MG CAPS Take 500 mg by mouth daily.    Marland Kitchen  levothyroxine (SYNTHROID) 125 MCG tablet Take 1 tablet by mouth once daily (Patient taking differently: Take 125 mcg by mouth daily before breakfast. ) 90 tablet 3  . potassium chloride SA (KLOR-CON) 20 MEQ tablet Take 1 tablet (20 mEq total) by mouth daily. 7 tablet 0   No current facility-administered medications for this visit.      Allergies:   Tetracycline hcl, Tetracyclines & related, Beta adrenergic blockers, Metoprolol tartrate, Amlodipine, and Amlodipine   Social History:  The patient  reports that he quit smoking about 40 years ago. His smoking use included cigarettes. He has never used smokeless tobacco. He reports that he does not drink alcohol or use drugs.   Family History:   family history includes Alcohol abuse  in his father and mother; Asthma in his mother; Liver cancer in his paternal grandmother; Prostate cancer in his father.    Review of Systems: Review of Systems  Constitutional: Negative.   HENT: Negative.   Respiratory: Negative.   Cardiovascular: Negative.   Gastrointestinal: Negative.   Musculoskeletal: Negative.   Neurological: Negative.   Psychiatric/Behavioral: Negative.   All other systems reviewed and are negative.   PHYSICAL EXAM: VS:  BP 128/80 (BP Location: Left Arm, Patient Position: Sitting, Cuff Size: Normal)   Pulse 82   Ht 5\' 6"  (1.676 m)   Wt 148 lb (67.1 kg)   BMI 23.89 kg/m  , BMI Body mass index is 23.89 kg/m. Constitutional:  oriented to person, place, and time. No distress.  HENT:  Head: Grossly normal Eyes:  no discharge. No scleral icterus.  Neck: No JVD, no carotid bruits  Cardiovascular: Regular rate and rhythm, 3/6 diastolic murmur Pulmonary/Chest: Clear to auscultation bilaterally, no wheezes or rails Abdominal: Soft.  no distension.  no tenderness.  Musculoskeletal: Normal range of motion Neurological:  normal muscle tone. Coordination normal. No atrophy Skin: Skin warm and dry Psychiatric: normal affect, pleasant   Recent Labs: 09/16/2019: TSH 10.228 09/20/2019: B Natriuretic Peptide 604.5 09/23/2019: ALT 83 09/26/2019: Magnesium 1.7 09/29/2019: BUN 12; Creatinine, Ser 0.73; Hemoglobin 9.6; Platelets 313; Potassium 4.3; Sodium 137    Lipid Panel Lab Results  Component Value Date   CHOL 184 04/02/2019   HDL 29.60 (L) 04/02/2019   LDLCALC 103 (H) 05/26/2012   TRIG 207.0 (H) 04/02/2019      Wt Readings from Last 3 Encounters:  10/06/19 148 lb (67.1 kg)  10/02/19 149 lb 14.6 oz (68 kg)  09/24/19 154 lb 12.8 oz (70.2 kg)     ASSESSMENT AND PLAN:   Bicuspid aortic valve - Plan: EKG 12-Lead Discussed recent events, long hospitalization x2 He has follow-up with Dr. Roxy Manns in the next week or 2 -We will plan for cardiac rehab at  week 4  Aortic valve insufficiency, etiology of cardiac valve disease unspecified -    Ascending aorta dilatation ([place]) - Plan: EKG 12- Underwent Bentall aortic root replacement Will need periodic imaging  Essential hypertension Recent weight loss after 2 hospitalizations.  Appears to be down at least 8 pounds Still not eating that much Blood pressure adequate No medications changes at this time Recommend he closely monitor blood pressure at home and call us with numbers which might improve as weight trends upward -Wife does not want to add beta-blockers feels it is causing his abdominal cramping  CAD with stable angina Will review films with interventional cardiology He is not particularly interested in intervention at this time prefers to get stronger before any PCI   Paroxysmal atrial  fibrillation  He has a monitor in place, scheduled to return in 1 week, will discuss data with him when available Not on anticoagulation   Total encounter time more than 45 minutes  Greater than 50% was spent in counseling and coordination of care with the patient   Disposition:   F/U  3 months   No orders of the defined types were placed in this encounter.    Signed, Esmond Plants, M.D., Ph.D. 10/06/2019  [place], [place]

## 2019-10-06 NOTE — Patient Instructions (Addendum)
I will get in touch with cardiac rehab I will talk with Dr. Arida/end about coronary issue   Monitor blood pressure, mychart numbers   Medication Instructions:  No changes  If you need a refill on your cardiac medications before your next appointment, please call your pharmacy.    Lab work: No new labs needed   If you have labs (blood work) drawn today and your tests are completely normal, you will receive your results only by: Marland Kitchen MyChart Message (if you have MyChart) OR . A paper copy in the mail If you have any lab test that is abnormal or we need to change your treatment, we will call you to review the results.   Testing/Procedures: No new testing needed   Follow-Up: At [facility], you and your health needs are our priority.  As part of our continuing mission to provide you with exceptional heart care, we have created designated Provider Care Teams.  These Care Teams include your primary Cardiologist (physician) and Advanced Practice Providers (APPs -  Physician Assistants and Nurse Practitioners) who all work together to provide you with the care you need, when you need it.  . You will need a follow up appointment in 3 months   . Providers on your designated Care Team:   . Murray Hodgkins, NP . Christell Faith, PA-C . Marrianne Mood, PA-C  Any Other Special Instructions Will Be Listed Below (If Applicable).  For educational health videos Log in to : [email or web address] Or : [email or web address], password : [email or web address]

## 2019-10-06 NOTE — Telephone Encounter (Signed)
Okay Hopefully he will have an upward trajectory from now on

## 2019-10-07 ENCOUNTER — Other Ambulatory Visit: Payer: Self-pay

## 2019-10-07 DIAGNOSIS — I724 Aneurysm of artery of lower extremity: Secondary | ICD-10-CM

## 2019-10-09 ENCOUNTER — Ambulatory Visit (INDEPENDENT_AMBULATORY_CARE_PROVIDER_SITE_OTHER): Payer: Self-pay | Admitting: Internal Medicine

## 2019-10-09 ENCOUNTER — Other Ambulatory Visit: Payer: Self-pay

## 2019-10-09 ENCOUNTER — Encounter: Payer: Self-pay | Admitting: Internal Medicine

## 2019-10-09 VITALS — BP 116/78 | HR 86 | Temp 98.6°F | Ht 66.0 in | Wt 152.0 lb

## 2019-10-09 DIAGNOSIS — I4891 Unspecified atrial fibrillation: Secondary | ICD-10-CM

## 2019-10-09 DIAGNOSIS — I77810 Thoracic aortic ectasia: Secondary | ICD-10-CM

## 2019-10-09 DIAGNOSIS — I970 Postcardiotomy syndrome: Secondary | ICD-10-CM

## 2019-10-09 LAB — RENAL FUNCTION PANEL
Albumin: 4 g/dL (ref 3.5–5.2)
BUN: 12 mg/dL (ref 6–23)
CO2: 29 mEq/L (ref 19–32)
Calcium: 9 mg/dL (ref 8.4–10.5)
Chloride: 102 mEq/L (ref 96–112)
Creatinine, Ser: 0.79 mg/dL (ref 0.40–1.50)
GFR: 98.81 mL/min (ref >60.00)
Glucose, Bld: 86 mg/dL (ref 70–99)
Phosphorus: 4.4 mg/dL (ref 2.3–4.6)
Potassium: 4.7 mEq/L (ref 3.5–5.1)
Sodium: 140 mEq/L (ref 135–145)

## 2019-10-09 LAB — TSH: TSH: 42.81 u[IU]/mL — ABNORMAL HIGH (ref 0.35–4.50)

## 2019-10-09 LAB — T4, FREE: Free T4: 0.7 ng/dL (ref 0.60–1.60)

## 2019-10-09 LAB — CBC
HCT: 32.1 % — ABNORMAL LOW (ref 39.0–52.0)
Hemoglobin: 10.6 g/dL — ABNORMAL LOW (ref 13.0–17.0)
MCHC: 33 g/dL (ref 30.0–36.0)
MCV: 93.3 fl (ref 78.0–100.0)
Platelets: 514 10*3/uL — ABNORMAL HIGH (ref 150.0–400.0)
RBC: 3.44 Mil/uL — ABNORMAL LOW (ref 4.22–5.81)
RDW: 18.6 % — ABNORMAL HIGH (ref 11.5–15.5)
WBC: 8.6 10*3/uL (ref 4.0–10.5)

## 2019-10-09 NOTE — Assessment & Plan Note (Addendum)
Post op No apparent recurrence Has monitor on now

## 2019-10-09 NOTE — Anesthesia Postprocedure Evaluation (Signed)
Anesthesia Post Note  Patient: Jeffrey Tucker  Procedure(s) Performed: REDO STERNOTOMY (N/A Chest) Transesophageal Echocardiogram Darden Dates) (N/A Chest) Evacuation Hematoma - Mediastinal Ligation of saphenous vein graft x two with Cardiopulmonary Bypass (N/A Chest)     Patient location during evaluation: SICU Anesthesia Type: General Level of consciousness: sedated Pain management: pain level controlled Vital Signs Assessment: post-procedure vital signs reviewed and stable Respiratory status: patient remains intubated per anesthesia plan Cardiovascular status: stable Postop Assessment: no apparent nausea or vomiting Anesthetic complications: no    Last Vitals:  Vitals:   10/02/19 0820 10/02/19 1202  BP: 122/83 116/86  Pulse: 95   Resp: 14   Temp: 36.7 C 37.3 C  SpO2: 92%     Last Pain:  Vitals:   10/02/19 1202  TempSrc: Oral  PainSc:                  Yiannis Tulloch

## 2019-10-09 NOTE — Assessment & Plan Note (Signed)
Fortunately the valve and aortic surgery has been preserved

## 2019-10-09 NOTE — Assessment & Plan Note (Signed)
Then ongoing bleeding requiring repeat surgery Doing better now Will need consideration for stents now due to ligation of bleeding vein grafts

## 2019-10-09 NOTE — Progress Notes (Signed)
Subjective:    Patient ID: Jeffrey Tucker, male    DOB: 1956/07/26, 63 y.o.   MRN: [id number]  HPI Here with wife for hospital follow up Reviewed both hospitalizations---after the initial one for surgery  Most recently started with bloody drainage from the sternal wound Then he lost consciousness---after he said "I see stars" Wife did CPR after getting him on floor and called 911 Was breathing again by the time rescue came (but no palpable pulse) Was in ER---then taken emergently to OR Vein graft was leaking--so had to be removed (2 vessels apparently) Now will need stents Valve repair and aorta are still okay  No chest pain No SOB No therapy yet---has been referred to cardiac rehab  Multiple transfusion when in hospital  Atrial fib post op  Has monitor on now  Current Outpatient Medications on File Prior to Visit  Medication Sig Dispense Refill  . acetaminophen (TYLENOL) 325 MG tablet Take 2 tablets (650 mg total) by mouth every 6 (six) hours as needed for mild pain.    Marland Kitchen albuterol (VENTOLIN HFA) 108 (90 Base) MCG/ACT inhaler Inhale 2 puffs into the lungs 3 (three) times daily as needed for wheezing or shortness of breath.    Marland Kitchen amiodarone (PACERONE) 200 MG tablet Take 1 tablet (200 mg total) by mouth daily. 30 tablet 0  . aspirin EC 81 MG EC tablet Take 1 tablet (81 mg total) by mouth daily. 30 tablet 0  . atorvastatin (LIPITOR) 80 MG tablet Take 1 tablet (80 mg total) by mouth daily at 6 PM. 30 tablet 0  . benzonatate (TESSALON PERLES) 100 MG capsule Take 1 capsule (100 mg total) by mouth 3 (three) times daily. 30 capsule 2  . cetirizine (ZYRTEC) 10 MG tablet Take 10 mg by mouth daily.    . ferrous sulfate 325 (65 FE) MG EC tablet Take 325 mg by mouth daily with breakfast.    . Fluticasone-Salmeterol (ADVAIR) 100-50 MCG/DOSE AEPB Inhale 1 puff into the lungs 2 (two) times daily. 60 each 11  . Glucosamine 500 MG CAPS Take 500 mg by mouth daily.    Marland Kitchen levothyroxine (SYNTHROID)  125 MCG tablet Take 1 tablet by mouth once daily (Patient taking differently: Take 125 mcg by mouth daily before breakfast. ) 90 tablet 3   No current facility-administered medications on file prior to visit.     Allergies  Allergen Reactions  . Tetracycline Hcl Other (See Comments)    Joint pain (and stiffened them, also)  . Tetracyclines & Related Other (See Comments)    Joint pain  . Beta Adrenergic Blockers     Patient experienced abdomen vasospasms with Metoprolol succinate/tartrate and carvedilol.  . Metoprolol Tartrate     Asthma and cough  . Amlodipine Swelling    Lower extremities and body became swollen; can tolerate 5 mg doses, however  . Amlodipine Other (See Comments)    10mg  dose causes lower extremity swelling. Pt can tolerate 5mg  dose.    Past Medical History:  Diagnosis Date  . Allergy    allergic rhinitis  . Aortic root dilatation ([place])    a. s/p surgical repair 08/27/2019  . Asthma    mostly in childhood  . Bicuspid aortic valve    a.  Status post bioprosthetic replacement 08/27/2019  . BPH (benign prostatic hypertrophy)   . CAD (coronary artery disease)    a. s/p 2-v CABG 08/2019 (SVG-OM, SVG--rPDA)  . GERD (gastroesophageal reflux disease)   . History of kidney  stones   . Hypertension   . Hypertriglyceridemia   . Hypothyroidism   . Pericardial tamponade 09/25/2019  . Severe aortic regurgitation    a. bicuspid aortic valve, b. s/p bio Bentall procedure 08/2019    Past Surgical History:  Procedure Laterality Date  . BENTALL PROCEDURE N/A 08/27/2019   Procedure: BENTALL PROCEDURE with a 27 mm KONECT Resilia Aortic Valved Conduit and 30 mm x 30 cm Hemashield Platinum straight graft.;  Surgeon: Wonda Olds, MD;  [facility];  Service: Open Heart Surgery;  Laterality: N/A;  . CARDIAC CATHETERIZATION  2006   [place]  . CARDIOVASCULAR STRESS TEST  2006   [place]  . CHOLECYSTECTOMY  2006  . CORONARY ARTERY BYPASS GRAFT N/A 08/27/2019    Procedure: CORONARY ARTERY BYPASS GRAFTING (CABG) times 2 using right saphenous endoscopic vein harvesting to OM2 nad PDA.;  Surgeon: Wonda Olds, MD;  Location: [place];  Service: Open Heart Surgery;  Laterality: N/A;  . CYSTOSCOPY W/ URETERAL STENT PLACEMENT  05/24/2017   Procedure: left;  Surgeon: Nickie Retort, MD;  Location: [facility];  Service: Urology;;  . Consuela Mimes W/ URETERAL STENT PLACEMENT Left 06/11/2017   Procedure: CYSTOSCOPY WITH STENT REPLACEMENT;  Surgeon: Hollice Espy, MD;  Location: [facility];  Service: Urology;  Laterality: Left;  . CYSTOSCOPY WITH BIOPSY Left 06/11/2017   Procedure: RENAL PELVIS TUMOR WITH BIOPSY WITH POSSIBLE LASER ABLATION;  Surgeon: Hollice Espy, MD;  Location: [facility];  Service: Urology;  Laterality: Left;  . CYSTOSCOPY WITH STENT PLACEMENT Left 05/24/2017   Procedure: , cystoscopy,left ureteral stent placement and left ureteroscopy attempted, retrograde pylogram;  Surgeon: Nickie Retort, MD;  Location: [facility];  Service: Urology;  Laterality: Left;  . EYE SURGERY Bilateral    Cataract Extraction with IOL  . HEMATOMA EVACUATION N/A 09/25/2019   Procedure: Evacuation Hematoma - Mediastinal Ligation of saphenous vein graft x two with Cardiopulmonary Bypass;  Surgeon: Rexene Alberts, MD;  Location: [place];  Service: Thoracic;  Laterality: N/A;  . IR THORACENTESIS ASP PLEURAL SPACE W/IMG GUIDE  09/02/2019  . RIGHT/LEFT HEART CATH AND CORONARY ANGIOGRAPHY N/A 08/21/2019   Procedure: RIGHT/LEFT HEART CATH AND CORONARY ANGIOGRAPHY;  Surgeon: Minna Merritts, MD;  Location: [place] [facility];  Service: Cardiovascular;  Laterality: N/A;  . STERNOTOMY N/A 09/25/2019   Procedure: REDO STERNOTOMY;  Surgeon: Rexene Alberts, MD;  Location: [place];  Service: Thoracic;  Laterality: N/A;  . TEE WITHOUT CARDIOVERSION N/A 08/27/2019   Procedure: TRANSESOPHAGEAL ECHOCARDIOGRAM (TEE);  Surgeon: Wonda Olds, MD;  Location: [place];  Service: Open  Heart Surgery;  Laterality: N/A;  . TEE WITHOUT CARDIOVERSION N/A 09/25/2019   Procedure: Transesophageal Echocardiogram (Tee);  Surgeon: Rexene Alberts, MD;  Location: [place];  Service: Thoracic;  Laterality: N/A;  . URETEROSCOPY WITH HOLMIUM LASER LITHOTRIPSY Left 06/11/2017   Procedure: URETEROSCOPY WITH HOLMIUM LASER LITHOTRIPSY;  Surgeon: Hollice Espy, MD;  Location: [facility];  Service: Urology;  Laterality: Left;  Marland Kitchen VEIN LIGATION AND STRIPPING Bilateral 1997    Family History  Problem Relation Age of Onset  . Alcohol abuse Mother   . Asthma Mother   . Alcohol abuse Father   . Prostate cancer Father   . Liver cancer Paternal Grandmother   . Colon cancer Neg Hx   . Coronary artery disease Neg Hx   . Hypertension Neg Hx   . Diabetes Neg Hx   . Bladder Cancer Neg Hx   . Kidney cancer  Neg Hx     Review of Systems Eating well now Swelling in legs is better now--on lasix for 7 days Bowels are fine Voids fine---stream is fine    Objective:   Physical Exam  Constitutional: He appears well-developed. No distress.  Neck: No thyromegaly present.  Cardiovascular: Normal rate, regular rhythm and normal heart sounds. Exam reveals no gallop.  No murmur heard. Respiratory: Effort normal. No respiratory distress. He has no wheezes. He has no rales. He exhibits no tenderness.  Dullness and decreased breath sounds right base  Musculoskeletal:        General: No edema.  Lymphadenopathy:    He has no cervical adenopathy.  Skin:  Benign seb keratosis on back (spot wife was concerned about)           Assessment & Plan:

## 2019-10-13 ENCOUNTER — Other Ambulatory Visit: Payer: Self-pay | Admitting: *Deleted

## 2019-10-13 ENCOUNTER — Ambulatory Visit (HOSPITAL_COMMUNITY)
Admission: RE | Admit: 2019-10-13 | Discharge: 2019-10-13 | Disposition: A | Discharge status: Home / Self Care | Payer: Self-pay | Source: Ambulatory Visit | Attending: Vascular Surgery | Admitting: Vascular Surgery

## 2019-10-13 ENCOUNTER — Encounter: Payer: Self-pay | Admitting: Vascular Surgery

## 2019-10-13 ENCOUNTER — Ambulatory Visit: Payer: Self-pay | Admitting: Cardiovascular Disease

## 2019-10-13 ENCOUNTER — Ambulatory Visit (INDEPENDENT_AMBULATORY_CARE_PROVIDER_SITE_OTHER): Payer: Self-pay | Admitting: Vascular Surgery

## 2019-10-13 ENCOUNTER — Encounter (HOSPITAL_COMMUNITY): Payer: Self-pay

## 2019-10-13 ENCOUNTER — Other Ambulatory Visit: Payer: Self-pay

## 2019-10-13 ENCOUNTER — Encounter: Payer: Self-pay | Admitting: *Deleted

## 2019-10-13 DIAGNOSIS — I729 Aneurysm of unspecified site: Secondary | ICD-10-CM | POA: Insufficient documentation

## 2019-10-13 DIAGNOSIS — I724 Aneurysm of artery of lower extremity: Secondary | ICD-10-CM | POA: Insufficient documentation

## 2019-10-13 NOTE — Progress Notes (Signed)
Patient name: Jeffrey Tucker MRN: [id number] DOB: 1956-05-01 Sex: male  REASON FOR VISIT: Hospital follow-up for right femoral pseudoaneurysm  HPI: Jeffrey Tucker is a 63 y.o. male with complex medical course following aortic root replacement and CABGx2 on 08/27/19 that presents for hospital follow-up for right femoral pseudoaneurysm.  Patient initially had heart cath via right femoral access at [facility] about 7 weeks ago.  Ultimately vascular surgery was called after his heart surgery here to evaluate a small right femoral pseudoaneurysm during his hospitalization following his CABG.  Ultimately his pseudoaneurysm had no identifiable neck and we were not comfortable with thrombin injection.  He did undergo ultrasound compression for at least 30 minutes with partial thrombosis.  Given that this pseudoaneurysm had been present for 6 weeks we elected to let him go home with short interval follow-up.  No new issues today.  No groin pain.  Leg feels good.  Past Medical History:  Diagnosis Date  . Allergy    allergic rhinitis  . Aortic root dilatation ([place])    a. s/p surgical repair 08/27/2019  . Asthma    mostly in childhood  . Bicuspid aortic valve    a.  Status post bioprosthetic replacement 08/27/2019  . BPH (benign prostatic hypertrophy)   . CAD (coronary artery disease)    a. s/p 2-v CABG 08/2019 (SVG-OM, SVG--rPDA)  . GERD (gastroesophageal reflux disease)   . History of kidney stones   . Hypertension   . Hypertriglyceridemia   . Hypothyroidism   . Pericardial tamponade 09/25/2019  . Severe aortic regurgitation    a. bicuspid aortic valve, b. s/p bio Bentall procedure 08/2019    Past Surgical History:  Procedure Laterality Date  . BENTALL PROCEDURE N/A 08/27/2019   Procedure: BENTALL PROCEDURE with a 27 mm KONECT Resilia Aortic Valved Conduit and 30 mm x 30 cm Hemashield Platinum straight graft.;  Surgeon: Wonda Olds, MD;  Location: [facility];  Service: Open Heart  Surgery;  Laterality: N/A;  . CARDIAC CATHETERIZATION  2006   [place]  . CARDIOVASCULAR STRESS TEST  2006   [place]  . CHOLECYSTECTOMY  2006  . CORONARY ARTERY BYPASS GRAFT N/A 08/27/2019   Procedure: CORONARY ARTERY BYPASS GRAFTING (CABG) times 2 using right saphenous endoscopic vein harvesting to OM2 nad PDA.;  Surgeon: Wonda Olds, MD;  Location: [place];  Service: Open Heart Surgery;  Laterality: N/A;  . CYSTOSCOPY W/ URETERAL STENT PLACEMENT  05/24/2017   Procedure: left;  Surgeon: Nickie Retort, MD;  Location: [facility];  Service: Urology;;  . Consuela Mimes W/ URETERAL STENT PLACEMENT Left 06/11/2017   Procedure: CYSTOSCOPY WITH STENT REPLACEMENT;  Surgeon: Hollice Espy, MD;  Location: [facility];  Service: Urology;  Laterality: Left;  . CYSTOSCOPY WITH BIOPSY Left 06/11/2017   Procedure: RENAL PELVIS TUMOR WITH BIOPSY WITH POSSIBLE LASER ABLATION;  Surgeon: Hollice Espy, MD;  Location: [facility];  Service: Urology;  Laterality: Left;  . CYSTOSCOPY WITH STENT PLACEMENT Left 05/24/2017   Procedure: , cystoscopy,left ureteral stent placement and left ureteroscopy attempted, retrograde pylogram;  Surgeon: Nickie Retort, MD;  Location: [facility];  Service: Urology;  Laterality: Left;  . EYE SURGERY Bilateral    Cataract Extraction with IOL  . HEMATOMA EVACUATION N/A 09/25/2019   Procedure: Evacuation Hematoma - Mediastinal Ligation of saphenous vein graft x two with Cardiopulmonary Bypass;  Surgeon: Rexene Alberts, MD;  Location: [facility];  Service: Thoracic;  Laterality: N/A;  . IR THORACENTESIS ASP PLEURAL SPACE  W/IMG GUIDE  09/02/2019  . RIGHT/LEFT HEART CATH AND CORONARY ANGIOGRAPHY N/A 08/21/2019   Procedure: RIGHT/LEFT HEART CATH AND CORONARY ANGIOGRAPHY;  Surgeon: Minna Merritts, MD;  Location: [place] [facility];  Service: Cardiovascular;  Laterality: N/A;  . STERNOTOMY N/A 09/25/2019   Procedure: REDO STERNOTOMY;  Surgeon: Rexene Alberts, MD;  Location: [place];   Service: Thoracic;  Laterality: N/A;  . TEE WITHOUT CARDIOVERSION N/A 08/27/2019   Procedure: TRANSESOPHAGEAL ECHOCARDIOGRAM (TEE);  Surgeon: Wonda Olds, MD;  Location: [place];  Service: Open Heart Surgery;  Laterality: N/A;  . TEE WITHOUT CARDIOVERSION N/A 09/25/2019   Procedure: Transesophageal Echocardiogram (Tee);  Surgeon: Rexene Alberts, MD;  Location: [place];  Service: Thoracic;  Laterality: N/A;  . URETEROSCOPY WITH HOLMIUM LASER LITHOTRIPSY Left 06/11/2017   Procedure: URETEROSCOPY WITH HOLMIUM LASER LITHOTRIPSY;  Surgeon: Hollice Espy, MD;  Location: [facility];  Service: Urology;  Laterality: Left;  Marland Kitchen VEIN LIGATION AND STRIPPING Bilateral 1997    Family History  Problem Relation Age of Onset  . Alcohol abuse Mother   . Asthma Mother   . Alcohol abuse Father   . Prostate cancer Father   . Liver cancer Paternal Grandmother   . Colon cancer Neg Hx   . Coronary artery disease Neg Hx   . Hypertension Neg Hx   . Diabetes Neg Hx   . Bladder Cancer Neg Hx   . Kidney cancer Neg Hx     SOCIAL HISTORY: Social History   Tobacco Use  . Smoking status: Former Smoker    Types: Cigarettes    Quit date: 11/26/1978    Years since quitting: 40.9  . Smokeless tobacco: Never Used  . Tobacco comment: social smoked till 1980's  Substance Use Topics  . Alcohol use: No    Alcohol/week: 0.0 standard drinks    Comment: none since 1984    Allergies  Allergen Reactions  . Tetracycline Hcl Other (See Comments)    Joint pain (and stiffened them, also)  . Tetracyclines & Related Other (See Comments)    Joint pain  . Beta Adrenergic Blockers     Patient experienced abdomen vasospasms with Metoprolol succinate/tartrate and carvedilol.  . Metoprolol Tartrate     Asthma and cough  . Amlodipine Swelling    Lower extremities and body became swollen; can tolerate 5 mg doses, however  . Amlodipine Other (See Comments)    10mg  dose causes lower extremity swelling. Pt can tolerate 5mg   dose.    Current Outpatient Medications  Medication Sig Dispense Refill  . acetaminophen (TYLENOL) 325 MG tablet Take 2 tablets (650 mg total) by mouth every 6 (six) hours as needed for mild pain.    Marland Kitchen albuterol (VENTOLIN HFA) 108 (90 Base) MCG/ACT inhaler Inhale 2 puffs into the lungs 3 (three) times daily as needed for wheezing or shortness of breath.    Marland Kitchen amiodarone (PACERONE) 200 MG tablet Take 1 tablet (200 mg total) by mouth daily. 30 tablet 0  . aspirin EC 81 MG EC tablet Take 1 tablet (81 mg total) by mouth daily. 30 tablet 0  . atorvastatin (LIPITOR) 80 MG tablet Take 1 tablet (80 mg total) by mouth daily at 6 PM. 30 tablet 0  . benzonatate (TESSALON PERLES) 100 MG capsule Take 1 capsule (100 mg total) by mouth 3 (three) times daily. 30 capsule 2  . cetirizine (ZYRTEC) 10 MG tablet Take 10 mg by mouth daily.    . ferrous sulfate 325 (65 FE)  MG EC tablet Take 325 mg by mouth daily with breakfast.    . Fluticasone-Salmeterol (ADVAIR) 100-50 MCG/DOSE AEPB Inhale 1 puff into the lungs 2 (two) times daily. 60 each 11  . Glucosamine 500 MG CAPS Take 500 mg by mouth daily.    Marland Kitchen levothyroxine (SYNTHROID) 125 MCG tablet Take 1 tablet by mouth once daily (Patient taking differently: Take 125 mcg by mouth daily before breakfast. ) 90 tablet 3   No current facility-administered medications for this visit.     REVIEW OF SYSTEMS:  [X]  denotes positive finding, [ ]  denotes negative finding Cardiac  Comments:  Chest pain or chest pressure:    Shortness of breath upon exertion:    Short of breath when lying flat:    Irregular heart rhythm:        Vascular    Pain in calf, thigh, or hip brought on by ambulation:    Pain in feet at night that wakes you up from your sleep:     Blood clot in your veins:    Leg swelling:         Pulmonary    Oxygen at home:    Productive cough:     Wheezing:         Neurologic    Sudden weakness in arms or legs:     Sudden numbness in arms or legs:      Sudden onset of difficulty speaking or slurred speech:    Temporary loss of vision in one eye:     Problems with dizziness:         Gastrointestinal    Blood in stool:     Vomited blood:         Genitourinary    Burning when urinating:     Blood in urine:        Psychiatric    Major depression:         Hematologic    Bleeding problems:    Problems with blood clotting too easily:        Skin    Rashes or ulcers:        Constitutional    Fever or chills:      PHYSICAL EXAM: Vitals:   10/13/19 0833  BP: 134/89  Pulse: 83  Resp: 20  Temp: 97.8 F (36.6 C)  SpO2: 98%  Weight: 146 lb (66.2 kg)  Height: 5\' 6"  (1.676 m)    GENERAL: The patient is a well-nourished male, in no acute distress. The vital signs are documented above. CARDIAC: There is a regular rate and rhythm.  VASCULAR:  Right femoral pulse 2+ palpable.  Palpable small pseudoaneurysm in right groin. Right dorsalis pedis and posterior tibial 2+ palpable  DATA:   Right femoral pseudoaneurysm partially thrombosed with residual diameter of 1.6 x 2.5 cm.  There remains essentially no neck.  Assessment/Plan:  63 year old male with partially thrombosed right femoral pseudoaneurysm that is about 67 weeks old.  Residual diameter measures 1.6 x 2.5 cm after [place] compression last week in the hospital..  Discussed again with him and his wife that I would not be comfortable with thrombin injection given essentially no neck.  We have already attempted ultrasound compression.  This point in time we have recommended open surgical repair in the operating room with a groin incision.  We will schedule this after Thanksgiving.  I think his risk for rupture of pseudoaneurysm is exceedingly low given this has been present for 7 weeks with no change  and it is partially thrombosed.  The residual diameter is fairly small.  Risks and benefits of surgery discussed with patient and his wife.   Marty Heck, MD [facility] Office: [phone] Pager: [phone]

## 2019-10-15 ENCOUNTER — Telehealth: Payer: Self-pay | Admitting: Cardiovascular Disease

## 2019-10-15 NOTE — Telephone Encounter (Signed)
Spoke with patient regarding his mychart message. We reviewed heart healthy diet, low sodium, and monitoring blood pressures. Discussed diet and how the ham is not a good choice due to sodium and his current health. We discussed blood pressure monitoring and importance of keeping a log of those readings and if he consistently trends 140/90 or higher then we would definitely need to know this. Advised that I would send him a mychart message with information on blood pressure monitoring and low sodium diet. He was very appreciative for the call verses mychart reply. He verbalized understanding of our conversation, agreement with plan, and had no further questions at this time.

## 2019-10-15 NOTE — Patient Instructions (Addendum)
How to Take Your Blood Pressure You can take your blood pressure at home with a machine. You may need to check your blood pressure at home:  To check if you have high blood pressure (hypertension).  To check your blood pressure over time.  To make sure your blood pressure medicine is working. Supplies needed: You will need a blood pressure machine, or monitor. You can buy one at a drugstore or online. When choosing one:  Choose one with an arm cuff.  Choose one that wraps around your upper arm. Only one finger should fit between your arm and the cuff.  Do not choose one that measures your blood pressure from your wrist or finger. Your doctor can suggest a monitor. How to prepare Avoid these things for 30 minutes before checking your blood pressure:  Drinking caffeine.  Drinking alcohol.  Eating.  Smoking.  Exercising. Five minutes before checking your blood pressure:  Pee.  Sit in a dining chair. Avoid sitting in a soft couch or armchair.  Be quiet. Do not talk. How to take your blood pressure Follow the instructions that came with your machine. If you have a digital blood pressure monitor, these may be the instructions: 1. Sit up straight. 2. Place your feet on the floor. Do not cross your ankles or legs. 3. Rest your left arm at the level of your heart. You may rest it on a table, desk, or chair. 4. Pull up your shirt sleeve. 5. Wrap the blood pressure cuff around the upper part of your left arm. The cuff should be 1 inch (2.5 cm) above your elbow. It is best to wrap the cuff around bare skin. 6. Fit the cuff snugly around your arm. You should be able to place only one finger between the cuff and your arm. 7. Put the cord inside the groove of your elbow. 8. Press the power button. 9. Sit quietly while the cuff fills with air and loses air. 10. Write down the numbers on the screen. 11. Wait 2-3 minutes and then repeat steps 1-10. What do the numbers mean? Two  numbers make up your blood pressure. The first number is called systolic pressure. The second is called diastolic pressure. An example of a blood pressure reading is "120 over 80" (or 120/80). If you are an adult and do not have a medical condition, use this guide to find out if your blood pressure is normal: Normal  First number: below 120.  Second number: below 80. Elevated  First number: 120-129.  Second number: below 80. Hypertension stage 1  First number: 130-139.  Second number: 80-89. Hypertension stage 2  First number: 140 or above.  Second number: 90 or above. Your blood pressure is above normal even if only the top or bottom number is above normal. Follow these instructions at home:  Check your blood pressure as often as your doctor tells you to.  Take your monitor to your next doctor's appointment. Your doctor will: ? Make sure you are using it correctly. ? Make sure it is working right.  Make sure you understand what your blood pressure numbers should be.  Tell your doctor if your medicines are causing side effects. Contact a doctor if:  Your blood pressure keeps being high. Get help right away if:  Your first blood pressure number is higher than 180.  Your second blood pressure number is higher than 120. This information is not intended to replace advice given to you by your health   care provider. Make sure you discuss any questions you have with your health care provider. Document Released: 10/25/2008 Document Revised: 10/25/2017 Document Reviewed: 04/20/2016 Elsevier Patient Education  2020 [place].  Blood Pressure Record Sheet To take your blood pressure, you will need a blood pressure machine. You can buy a blood pressure machine (blood pressure monitor) at your clinic, drug store, or online. When choosing one, consider:  An automatic monitor that has an arm cuff.  A cuff that wraps snugly around your upper arm. You should be able to fit only  one finger between your arm and the cuff.  A device that stores blood pressure reading results.  Do not choose a monitor that measures your blood pressure from your wrist or finger. Follow your health care provider's instructions for how to take your blood pressure. To use this form:  Get one reading in the morning (a.m.) before you take any medicines.  Get one reading in the evening (p.m.) before supper.  Take at least 2 readings with each blood pressure check. This makes sure the results are correct. Wait 1-2 minutes between measurements.  Write down the results in the spaces on this form.  Repeat this once a week, or as told by your health care provider.  Make a follow-up appointment with your health care provider to discuss the results. Blood pressure log Date: _______________________  a.m. _____________________(1st reading) _____________________(2nd reading)  p.m. _____________________(1st reading) _____________________(2nd reading) Date: _______________________  a.m. _____________________(1st reading) _____________________(2nd reading)  p.m. _____________________(1st reading) _____________________(2nd reading) Date: _______________________  a.m. _____________________(1st reading) _____________________(2nd reading)  p.m. _____________________(1st reading) _____________________(2nd reading) Date: _______________________  a.m. _____________________(1st reading) _____________________(2nd reading)  p.m. _____________________(1st reading) _____________________(2nd reading) Date: _______________________  a.m. _____________________(1st reading) _____________________(2nd reading)  p.m. _____________________(1st reading) _____________________(2nd reading) This information is not intended to replace advice given to you by your health care provider. Make sure you discuss any questions you have with your health care provider. Document Released: 08/11/2003 Document Revised:  01/10/2018 Document Reviewed: 11/12/2017 Elsevier Patient Education  2020 [place] Heart-healthy meal planning includes:  Eating less unhealthy fats.  Eating more healthy fats.  Making other changes in your diet. Talk with your doctor or a diet specialist (dietitian) to create an eating plan that is right for you. What is my plan? Your doctor may recommend an eating plan that includes:  Total fat: ______% or less of total calories a day.  Saturated fat: ______% or less of total calories a day.  Cholesterol: less than _________mg a day. What are tips for following this plan? Cooking Avoid frying your food. Try to bake, boil, grill, or broil it instead. You can also reduce fat by:  Removing the skin from poultry.  Removing all visible fats from meats.  Steaming vegetables in water or broth. Meal planning   At meals, divide your plate into four equal parts: ? Fill one-half of your plate with vegetables and green salads. ? Fill one-fourth of your plate with whole grains. ? Fill one-fourth of your plate with lean protein foods.  Eat 4-5 servings of vegetables per day. A serving of vegetables is: ? 1 cup of raw or cooked vegetables. ? 2 cups of raw leafy greens.  Eat 4-5 servings of fruit per day. A serving of fruit is: ? 1 medium whole fruit. ?  cup of dried fruit. ?  cup of fresh, frozen, or canned fruit. ?  cup of 100% fruit juice.  Eat more foods that have  soluble fiber. These are apples, broccoli, carrots, beans, peas, and barley. Try to get 20-30 g of fiber per day.  Eat 4-5 servings of nuts, legumes, and seeds per week: ? 1 serving of dried beans or legumes equals  cup after being cooked. ? 1 serving of nuts is  cup. ? 1 serving of seeds equals 1 tablespoon. General information  Eat more home-cooked food. Eat less restaurant, buffet, and fast food.  Limit or avoid alcohol.  Limit foods that are high in starch and  sugar.  Avoid fried foods.  Lose weight if you are overweight.  Keep track of how much salt (sodium) you eat. This is important if you have high blood pressure. Ask your doctor to tell you more about this.  Try to add vegetarian meals each week. Fats  Choose healthy fats. These include olive oil and canola oil, flaxseeds, walnuts, almonds, and seeds.  Eat more omega-3 fats. These include salmon, mackerel, sardines, tuna, flaxseed oil, and ground flaxseeds. Try to eat fish at least 2 times each week.  Check food labels. Avoid foods with trans fats or high amounts of saturated fat.  Limit saturated fats. ? These are often found in animal products, such as meats, butter, and cream. ? These are also found in plant foods, such as palm oil, palm kernel oil, and coconut oil.  Avoid foods with partially hydrogenated oils in them. These have trans fats. Examples are stick margarine, some tub margarines, cookies, crackers, and other baked goods. What foods can I eat? Fruits All fresh, canned (in natural juice), or frozen fruits. Vegetables Fresh or frozen vegetables (raw, steamed, roasted, or grilled). Green salads. Grains Most grains. Choose whole wheat and whole grains most of the time. Rice and pasta, including brown rice and pastas made with whole wheat. Meats and other proteins Lean, well-trimmed beef, veal, pork, and lamb. Chicken and [place] without skin. All fish and shellfish. Wild duck, rabbit, pheasant, and venison. Egg whites or low-cholesterol egg substitutes. Dried beans, peas, lentils, and tofu. Seeds and most nuts. Dairy Low-fat or nonfat cheeses, including ricotta and mozzarella. Skim or 1% milk that is liquid, powdered, or evaporated. Buttermilk that is made with low-fat milk. Nonfat or low-fat yogurt. Fats and oils Non-hydrogenated (trans-free) margarines. Vegetable oils, including soybean, sesame, sunflower, olive, peanut, safflower, corn, canola, and cottonseed. Salad  dressings or mayonnaise made with a vegetable oil. Beverages Mineral water. Coffee and tea. Diet carbonated beverages. Sweets and desserts Sherbet, gelatin, and fruit ice. Small amounts of dark chocolate. Limit all sweets and desserts. Seasonings and condiments All seasonings and condiments. The items listed above may not be a complete list of foods and drinks you can eat. Contact a dietitian for more options. What foods should I avoid? Fruits Canned fruit in heavy syrup. Fruit in cream or butter sauce. Fried fruit. Limit coconut. Vegetables Vegetables cooked in cheese, cream, or butter sauce. Fried vegetables. Grains Breads that are made with saturated or trans fats, oils, or whole milk. Croissants. Sweet rolls. Donuts. High-fat crackers, such as cheese crackers. Meats and other proteins Fatty meats, such as hot dogs, ribs, sausage, bacon, rib-eye roast or steak. High-fat deli meats, such as salami and bologna. Caviar. Domestic duck and goose. Organ meats, such as liver. Dairy Cream, sour cream, cream cheese, and creamed cottage cheese. Whole-milk cheeses. Whole or 2% milk that is liquid, evaporated, or condensed. Whole buttermilk. Cream sauce or high-fat cheese sauce. Yogurt that is made from whole milk. Fats and oils Meat  fat, or shortening. Cocoa butter, hydrogenated oils, palm oil, coconut oil, palm kernel oil. Solid fats and shortenings, including bacon fat, salt pork, lard, and butter. Nondairy cream substitutes. Salad dressings with cheese or sour cream. Beverages Regular sodas and juice drinks with added sugar. Sweets and desserts Frosting. Pudding. Cookies. Cakes. Pies. Milk chocolate or white chocolate. Buttered syrups. Full-fat ice cream or ice cream drinks. The items listed above may not be a complete list of foods and drinks to avoid. Contact a dietitian for more information. Summary  Heart-healthy meal planning includes eating less unhealthy fats, eating more healthy fats,  and making other changes in your diet.  Eat a balanced diet. This includes fruits and vegetables, low-fat or nonfat dairy, lean protein, nuts and legumes, whole grains, and heart-healthy oils and fats. This information is not intended to replace advice given to you by your health care provider. Make sure you discuss any questions you have with your health care provider. Document Released: 05/13/2012 Document Revised: 01/16/2018 Document Reviewed: 12/20/2017 Elsevier Patient Education  [place].  Low-Sodium Eating Plan Sodium, which is an element that makes up salt, helps you maintain a healthy balance of fluids in your body. Too much sodium can increase your blood pressure and cause fluid and waste to be held in your body. Your health care provider or dietitian may recommend following this plan if you have high blood pressure (hypertension), kidney disease, liver disease, or heart failure. Eating less sodium can help lower your blood pressure, reduce swelling, and protect your heart, liver, and kidneys. What are tips for following this plan? General guidelines  Most people on this plan should limit their sodium intake to 1,500-2,000 mg (milligrams) of sodium each day. Reading food labels   The Nutrition Facts label lists the amount of sodium in one serving of the food. If you eat more than one serving, you must multiply the listed amount of sodium by the number of servings.  Choose foods with less than 140 mg of sodium per serving.  Avoid foods with 300 mg of sodium or more per serving. Shopping  Look for lower-sodium products, often labeled as "low-sodium" or "no salt added."  Always check the sodium content even if foods are labeled as "unsalted" or "no salt added".  Buy fresh foods. ? Avoid canned foods and premade or frozen meals. ? Avoid canned, cured, or processed meats  Buy breads that have less than 80 mg of sodium per slice. Cooking  Eat more home-cooked food and  less restaurant, buffet, and fast food.  Avoid adding salt when cooking. Use salt-free seasonings or herbs instead of table salt or sea salt. Check with your health care provider or pharmacist before using salt substitutes.  Cook with plant-based oils, such as canola, sunflower, or olive oil. Meal planning  When eating at a restaurant, ask that your food be prepared with less salt or no salt, if possible.  Avoid foods that contain MSG (monosodium glutamate). MSG is sometimes added to [place] food, bouillon, and some canned foods. What foods are recommended? The items listed may not be a complete list. Talk with your dietitian about what dietary choices are best for you. Grains Low-sodium cereals, including oats, puffed wheat and rice, and shredded wheat. Low-sodium crackers. Unsalted rice. Unsalted pasta. Low-sodium bread. Whole-grain breads and whole-grain pasta. Vegetables Fresh or frozen vegetables. "No salt added" canned vegetables. "No salt added" tomato sauce and paste. Low-sodium or reduced-sodium tomato and vegetable juice. Fruits Fresh, frozen, or  canned fruit. Fruit juice. Meats and other protein foods Fresh or frozen (no salt added) meat, poultry, seafood, and fish. Low-sodium canned tuna and salmon. Unsalted nuts. Dried peas, beans, and lentils without added salt. Unsalted canned beans. Eggs. Unsalted nut butters. Dairy Milk. Soy milk. Cheese that is naturally low in sodium, such as ricotta cheese, fresh mozzarella, or Swiss cheese Low-sodium or reduced-sodium cheese. Cream cheese. Yogurt. Fats and oils Unsalted butter. Unsalted margarine with no trans fat. Vegetable oils such as canola or olive oils. Seasonings and other foods Fresh and dried herbs and spices. Salt-free seasonings. Low-sodium mustard and ketchup. Sodium-free salad dressing. Sodium-free light mayonnaise. Fresh or refrigerated horseradish. Lemon juice. Vinegar. Homemade, reduced-sodium, or low-sodium soups.  Unsalted popcorn and pretzels. Low-salt or salt-free chips. What foods are not recommended? The items listed may not be a complete list. Talk with your dietitian about what dietary choices are best for you. Grains Instant hot cereals. Bread stuffing, pancake, and biscuit mixes. Croutons. Seasoned rice or pasta mixes. Noodle soup cups. Boxed or frozen macaroni and cheese. Regular salted crackers. Self-rising flour. Vegetables Sauerkraut, pickled vegetables, and relishes. Olives. [place] fries. Onion rings. Regular canned vegetables (not low-sodium or reduced-sodium). Regular canned tomato sauce and paste (not low-sodium or reduced-sodium). Regular tomato and vegetable juice (not low-sodium or reduced-sodium). Frozen vegetables in sauces. Meats and other protein foods Meat or fish that is salted, canned, smoked, spiced, or pickled. Bacon, ham, sausage, hotdogs, corned beef, chipped beef, packaged lunch meats, salt pork, jerky, pickled herring, anchovies, regular canned tuna, sardines, salted nuts. Dairy Processed cheese and cheese spreads. Cheese curds. Blue cheese. Feta cheese. String cheese. Regular cottage cheese. Buttermilk. Canned milk. Fats and oils Salted butter. Regular margarine. Ghee. Bacon fat. Seasonings and other foods Onion salt, garlic salt, seasoned salt, table salt, and sea salt. Canned and packaged gravies. Worcestershire sauce. Tartar sauce. Barbecue sauce. Teriyaki sauce. Soy sauce, including reduced-sodium. Steak sauce. Fish sauce. Oyster sauce. Cocktail sauce. Horseradish that you find on the shelf. Regular ketchup and mustard. Meat flavorings and tenderizers. Bouillon cubes. Hot sauce and Tabasco sauce. Premade or packaged marinades. Premade or packaged taco seasonings. Relishes. Regular salad dressings. Salsa. Potato and tortilla chips. Corn chips and puffs. Salted popcorn and pretzels. Canned or dried soups. Pizza. Frozen entrees and pot pies. Summary  Eating less sodium can  help lower your blood pressure, reduce swelling, and protect your heart, liver, and kidneys.  Most people on this plan should limit their sodium intake to 1,500-2,000 mg (milligrams) of sodium each day.  Canned, boxed, and frozen foods are high in sodium. Restaurant foods, fast foods, and pizza are also very high in sodium. You also get sodium by adding salt to food.  Try to cook at home, eat more fresh fruits and vegetables, and eat less fast food, canned, processed, or prepared foods. This information is not intended to replace advice given to you by your health care provider. Make sure you discuss any questions you have with your health care provider. Document Released: 05/04/2002 Document Revised: 10/25/2017 Document Reviewed: 11/05/2016 Elsevier Patient Education  2020 [place] DASH stands for "Dietary Approaches to Stop Hypertension." The DASH eating plan is a healthy eating plan that has been shown to reduce high blood pressure (hypertension). It may also reduce your risk for type 2 diabetes, heart disease, and stroke. The DASH eating plan may also help with weight loss. What are tips for following this plan?  General guidelines  Avoid eating  more than 2,300 mg (milligrams) of salt (sodium) a day. If you have hypertension, you may need to reduce your sodium intake to 1,500 mg a day.  Limit alcohol intake to no more than 1 drink a day for nonpregnant women and 2 drinks a day for men. One drink equals 12 oz of beer, 5 oz of wine, or 1 oz of hard liquor.  Work with your health care provider to maintain a healthy body weight or to lose weight. Ask what an ideal weight is for you.  Get at least 30 minutes of exercise that causes your heart to beat faster (aerobic exercise) most days of the week. Activities may include walking, swimming, or biking.  Work with your health care provider or diet and nutrition specialist (dietitian) to adjust your eating plan to your  individual calorie needs. Reading food labels   Check food labels for the amount of sodium per serving. Choose foods with less than 5 percent of the Daily Value of sodium. Generally, foods with less than 300 mg of sodium per serving fit into this eating plan.  To find whole grains, look for the word "whole" as the first word in the ingredient list. Shopping  Buy products labeled as "low-sodium" or "no salt added."  Buy fresh foods. Avoid canned foods and premade or frozen meals. Cooking  Avoid adding salt when cooking. Use salt-free seasonings or herbs instead of table salt or sea salt. Check with your health care provider or pharmacist before using salt substitutes.  Do not fry foods. Cook foods using healthy methods such as baking, boiling, grilling, and broiling instead.  Cook with heart-healthy oils, such as olive, canola, soybean, or sunflower oil. Meal planning  Eat a balanced diet that includes: ? 5 or more servings of fruits and vegetables each day. At each meal, try to fill half of your plate with fruits and vegetables. ? Up to 6-8 servings of whole grains each day. ? Less than 6 oz of lean meat, poultry, or fish each day. A 3-oz serving of meat is about the same size as a deck of cards. One egg equals 1 oz. ? 2 servings of low-fat dairy each day. ? A serving of nuts, seeds, or beans 5 times each week. ? Heart-healthy fats. Healthy fats called Omega-3 fatty acids are found in foods such as flaxseeds and coldwater fish, like sardines, salmon, and mackerel.  Limit how much you eat of the following: ? Canned or prepackaged foods. ? Food that is high in trans fat, such as fried foods. ? Food that is high in saturated fat, such as fatty meat. ? Sweets, desserts, sugary drinks, and other foods with added sugar. ? Full-fat dairy products.  Do not salt foods before eating.  Try to eat at least 2 vegetarian meals each week.  Eat more home-cooked food and less restaurant,  buffet, and fast food.  When eating at a restaurant, ask that your food be prepared with less salt or no salt, if possible. What foods are recommended? The items listed may not be a complete list. Talk with your dietitian about what dietary choices are best for you. Grains Whole-grain or whole-wheat bread. Whole-grain or whole-wheat pasta. Brown rice. [place]. Bulgur. Whole-grain and low-sodium cereals. Pita bread. Low-fat, low-sodium crackers. Whole-wheat flour tortillas. Vegetables Fresh or frozen vegetables (raw, steamed, roasted, or grilled). Low-sodium or reduced-sodium tomato and vegetable juice. Low-sodium or reduced-sodium tomato sauce and tomato paste. Low-sodium or reduced-sodium canned vegetables. Fruits All fresh,  dried, or frozen fruit. Canned fruit in natural juice (without added sugar). Meat and other protein foods Skinless chicken or [place]. Ground chicken or [place]. Pork with fat trimmed off. Fish and seafood. Egg whites. Dried beans, peas, or lentils. Unsalted nuts, nut butters, and seeds. Unsalted canned beans. Lean cuts of beef with fat trimmed off. Low-sodium, lean deli meat. Dairy Low-fat (1%) or fat-free (skim) milk. Fat-free, low-fat, or reduced-fat cheeses. Nonfat, low-sodium ricotta or cottage cheese. Low-fat or nonfat yogurt. Low-fat, low-sodium cheese. Fats and oils Soft margarine without trans fats. Vegetable oil. Low-fat, reduced-fat, or light mayonnaise and salad dressings (reduced-sodium). Canola, safflower, olive, soybean, and sunflower oils. Avocado. Seasoning and other foods Herbs. Spices. Seasoning mixes without salt. Unsalted popcorn and pretzels. Fat-free sweets. What foods are not recommended? The items listed may not be a complete list. Talk with your dietitian about what dietary choices are best for you. Grains Baked goods made with fat, such as croissants, muffins, or some breads. Dry pasta or rice meal packs. Vegetables Creamed or fried  vegetables. Vegetables in a cheese sauce. Regular canned vegetables (not low-sodium or reduced-sodium). Regular canned tomato sauce and paste (not low-sodium or reduced-sodium). Regular tomato and vegetable juice (not low-sodium or reduced-sodium). Angie Fava. Olives. Fruits Canned fruit in a light or heavy syrup. Fried fruit. Fruit in cream or butter sauce. Meat and other protein foods Fatty cuts of meat. Ribs. Fried meat. [place] Salines. Sausage. Bologna and other processed lunch meats. Salami. Fatback. Hotdogs. Bratwurst. Salted nuts and seeds. Canned beans with added salt. Canned or smoked fish. Whole eggs or egg yolks. Chicken or [place] with skin. Dairy Whole or 2% milk, cream, and half-and-half. Whole or full-fat cream cheese. Whole-fat or sweetened yogurt. Full-fat cheese. Nondairy creamers. Whipped toppings. Processed cheese and cheese spreads. Fats and oils Butter. Stick margarine. Lard. Shortening. Ghee. Bacon fat. Tropical oils, such as coconut, palm kernel, or palm oil. Seasoning and other foods Salted popcorn and pretzels. Onion salt, garlic salt, seasoned salt, table salt, and sea salt. Worcestershire sauce. Tartar sauce. Barbecue sauce. Teriyaki sauce. Soy sauce, including reduced-sodium. Steak sauce. Canned and packaged gravies. Fish sauce. Oyster sauce. Cocktail sauce. Horseradish that you find on the shelf. Ketchup. Mustard. Meat flavorings and tenderizers. Bouillon cubes. Hot sauce and Tabasco sauce. Premade or packaged marinades. Premade or packaged taco seasonings. Relishes. Regular salad dressings. Where to find more information:  National Heart, Lung, and [place]: https://wilson-eaton.com/  American Heart Association: www.heart.org Summary  The DASH eating plan is a healthy eating plan that has been shown to reduce high blood pressure (hypertension). It may also reduce your risk for type 2 diabetes, heart disease, and stroke.  With the DASH eating plan, you should limit salt (sodium)  intake to 2,300 mg a day. If you have hypertension, you may need to reduce your sodium intake to 1,500 mg a day.  When on the DASH eating plan, aim to eat more fresh fruits and vegetables, whole grains, lean proteins, low-fat dairy, and heart-healthy fats.  Work with your health care provider or diet and nutrition specialist (dietitian) to adjust your eating plan to your individual calorie needs. This information is not intended to replace advice given to you by your health care provider. Make sure you discuss any questions you have with your health care provider. Document Released: 11/01/2011 Document Revised: 10/25/2017 Document Reviewed: 11/05/2016 Elsevier Patient Education  2020 [place].  Heart Failure Eating Plan Heart failure, also called congestive heart failure, occurs when your heart does not  pump blood well enough to meet your body's needs for oxygen-rich blood. Heart failure is a long-term (chronic) condition. Living with heart failure can be challenging. However, following your health care provider's instructions about a healthy lifestyle and working with a diet and nutrition specialist (dietitian) to choose the right foods may help to improve your symptoms. What are tips for following this plan? Reading food labels  Check food labels for the amount of sodium per serving. Choose foods that have less than 140 mg (milligrams) of sodium in each serving.  Check food labels for the number of calories per serving. This is important if you need to limit your daily calorie intake to lose weight.  Check food labels for the serving size. If you eat more than one serving, you will be eating more sodium and calories than what is listed on the label.  Look for foods that are labeled as "sodium-free," "very low sodium," or "low sodium." ? Foods labeled as "reduced sodium" or "lightly salted" may still have more sodium than what is recommended for you. Cooking  Avoid adding salt when  cooking. Ask your health care provider or dietitian before using salt substitutes.  Season food with salt-free seasonings, spices, or herbs. Check the label of seasoning mixes to make sure they do not contain salt.  Cook with heart-healthy oils, such as olive, canola, soybean, or sunflower oil.  Do not fry foods. Cook foods using low-fat methods, such as baking, boiling, grilling, and broiling.  Limit unhealthy fats when cooking by: ? Removing the skin from poultry, such as chicken. ? Removing all visible fats from meats. ? Skimming the fat off from stews, soups, and gravies before serving them. Meal planning   Limit your intake of: ? Processed, canned, or pre-packaged foods. ? Foods that are high in trans fat, such as fried foods. ? Sweets, desserts, sugary drinks, and other foods with added sugar. ? Full-fat dairy products, such as whole milk.  Eat a balanced diet that includes: ? 4-5 servings of fruit each day and 4-5 servings of vegetables each day. At each meal, try to fill half of your plate with fruits and vegetables. ? Up to 6-8 servings of whole grains each day. ? Up to 2 servings of lean meat, poultry, or fish each day. One serving of meat is equal to 3 oz. This is about the same size as a deck of cards. ? 2 servings of low-fat dairy each day. ? Heart-healthy fats. Healthy fats called omega-3 fatty acids are found in foods such as flaxseed and cold-water fish like sardines, salmon, and mackerel.  Aim to eat 25-35 g (grams) of fiber a day. Foods that are high in fiber include apples, broccoli, carrots, beans, peas, and whole grains.  Do not add salt or condiments that contain salt (such as soy sauce) to foods before eating.  When eating at a restaurant, ask that your food be prepared with less salt or no salt, if possible.  Try to eat 2 or more vegetarian meals each week.  Eat more home-cooked food and eat less restaurant, buffet, and fast food. General information  Do  not eat more than 2,300 mg of salt (sodium) a day. The amount of sodium that is recommended for you may be lower, depending on your condition.  Maintain a healthy body weight as directed. Ask your health care provider what a healthy weight is for you. ? Check your weight every day. ? Work with your health care provider and  dietitian to make a plan that is right for you to lose weight or maintain your current weight.  Limit how much fluid you drink. Ask your health care provider or dietitian how much fluid you can have each day.  Limit or avoid alcohol as told by your health care provider or dietitian. Recommended foods The items listed may not be a complete list. Talk with your dietitian about what dietary choices are best for you. Fruits All fresh, frozen, and canned fruits. Dried fruits, such as raisins, prunes, and cranberries. Vegetables All fresh vegetables. Vegetables that are frozen without sauce or added salt. Low-sodium or sodium-free canned vegetables. Grains Bread with less than 80 mg of sodium per slice. Whole-wheat pasta, quinoa, and brown rice. Oats and oatmeal. Barley. [place]. Grits and cream of wheat. Whole-grain and whole-wheat cold cereal. Meats and other protein foods Lean cuts of meat. Skinless chicken and [place]. Fish with high omega-3 fatty acids, such as salmon, sardines, and other cold-water fishes. Eggs. Dried beans, peas, and edamame. Unsalted nuts and nut butters. Dairy Low-fat or nonfat (skim) milk and dried milk. Rice milk, soy milk, and almond milk. Low-fat or nonfat yogurt. Small amounts of reduced-sodium block cheese. Low-sodium cottage cheese. Fats and oils Olive, canola, soybean, flaxseed, or sunflower oil. Avocado. Sweets and desserts Apple sauce. Granola bars. Sugar-free pudding and gelatin. Frozen fruit bars. Seasoning and other foods Fresh and dried herbs. Lemon or lime juice. Vinegar. Low-sodium ketchup. Salt-free marinades, salad dressings, sauces,  and seasonings. The items listed above may not be a complete list of foods and beverages you can eat. Contact a dietitian for more information. Foods to avoid The items listed may not be a complete list. Talk with your dietitian about what dietary choices are best for you. Fruits Fruits that are dried with sodium-containing preservatives. Vegetables Canned vegetables. Frozen vegetables with sauce or seasonings. Creamed vegetables. [place] fries. Onion rings. Pickled vegetables and sauerkraut. Grains Bread with more than 80 mg of sodium per slice. Hot or cold cereal with more than 140 mg sodium per serving. Salted pretzels and crackers. Pre-packaged breadcrumbs. Bagels, croissants, and biscuits. Meats and other protein foods Ribs and chicken wings. Bacon, ham, pepperoni, bologna, salami, and packaged luncheon meats. Hot dogs, bratwurst, and sausage. Canned meat. Smoked meat and fish. Salted nuts and seeds. Dairy Whole milk, half-and-half, and cream. Buttermilk. Processed cheese, cheese spreads, and cheese curds. Regular cottage cheese. Feta cheese. Shredded cheese. String cheese. Fats and oils Butter, lard, shortening, ghee, and bacon fat. Canned and packaged gravies. Seasoning and other foods Onion salt, garlic salt, table salt, and sea salt. Marinades. Regular salad dressings. Relishes, pickles, and olives. Meat flavorings and tenderizers, and bouillon cubes. Horseradish, ketchup, and mustard. Worcestershire sauce. Teriyaki sauce, soy sauce (including reduced sodium). Hot sauce and Tabasco sauce. Steak sauce, fish sauce, oyster sauce, and cocktail sauce. Taco seasonings. Barbecue sauce. Tartar sauce. The items listed above may not be a complete list of foods and beverages you should avoid. Contact a dietitian for more information. Summary  A heart failure eating plan includes changes that limit your intake of sodium and unhealthy fat, and it may help you lose weight or maintain a healthy weight.  Your health care provider may also recommend limiting how much fluid you drink.  Most people with heart failure should eat no more than 2,300 mg of salt (sodium) a day. The amount of sodium that is recommended for you may be lower, depending on your condition.  Contact your health  care provider or dietitian before making any major changes to your diet. This information is not intended to replace advice given to you by your health care provider. Make sure you discuss any questions you have with your health care provider. Document Released: 03/29/2017 Document Revised: 01/08/2019 Document Reviewed: 03/29/2017 Elsevier Patient Education  2020 [place].  Heart Failure Exacerbation  Heart failure is a condition in which the heart does not fill up with enough blood, and therefore does not pump enough blood and oxygen to the body. When this happens, parts of the body do not get the blood and oxygen they need to function properly. This can cause symptoms such as breathing problems, fatigue, swelling, and confusion. Heart failure exacerbation refers to heart failure symptoms that get worse. The symptoms may get worse suddenly or develop slowly over time. Heart failure exacerbation is a serious medical problem that should be treated right away. What are the causes? A heart failure exacerbation can be triggered by:  Not taking your heart failure medicines correctly.  Infections.  Eating an unhealthy diet or a diet that is high in salt (sodium).  Drinking too much fluid.  Drinking alcohol.  Taking illegal drugs, such as cocaine or methamphetamine.  Not exercising. Other causes include:  Other heart conditions such as an irregular heartbeat (arrhythmia).  Anemia.  Other medical problems, such as kidney failure. Sometimes the cause of the exacerbation is not known. What are the signs or symptoms? When heart failure symptoms suddenly or slowly get worse, this may be a sign of heart failure  exacerbation. Symptoms of heart failure include:  Breathing problems or shortness of breath.  Chronic coughing or wheezing.  Fatigue.  Nausea or lack of appetite.  Feeling light-headed.  Confusion or memory loss.  Increased heart rate or irregular heartbeat.  Buildup of fluid in the legs, ankles, feet, or abdomen.  Difficulty breathing when lying down. How is this diagnosed? This condition is diagnosed based on:  Your symptoms and medical history.  A physical exam. You may also have tests, including:  Electrocardiogram (ECG). This test measures the electrical activity of your heart.  Echocardiogram. This test uses sound waves to take a picture of your heart to see how well it works.  Blood tests.  Imaging tests, such as: ? Chest X-ray. ? MRI. ? Ultrasound.  Stress test. This test examines how well your heart functions when you exercise. Your heart is monitored while you exercise on a treadmill or exercise bike. If you cannot exercise, medicines may be used to increase your heartbeat in place of exercise.  Cardiac catheterization. During this test, a thin, flexible tube (catheter) is inserted into a blood vessel and threaded up to your heart. This test allows your health care provider to check the arteries that lead to your heart (coronary arteries).  Right heart catheterization. During this test, the pressure in your heart is measured. How is this treated? This condition may be treated by:  Adjusting your heart medicines.  Maintaining a healthy lifestyle. This includes: ? Eating a heart-healthy diet that is low in sodium. ? Not using any products that contain nicotine or tobacco, such as cigarettes and e-cigarettes. ? Regular exercise. ? Monitoring your fluid intake. ? Monitoring your weight and reporting changes to your health care provider.  Treating sleep apnea, if you have this condition.  Surgery. This may include: ? Implanting a device that helps both  sides of your heart contract at the same time (cardiac resynchronization therapy device).  This can help with heart function and relieve heart failure symptoms. ? Implanting a device that can correct heart rhythm problems (implantable cardioverter defibrillator). ? Connecting a device to your heart to help it pump blood (ventricular assist device). ? Heart transplant. Follow these instructions at home: Medicines  Take over-the-counter and prescription medicines only as told by your health care provider.  Do not stop taking your medicines or change the amount you take. If you are having problems or side effects from your medicines, talk to your health care provider.  If you are having difficulty paying for your medicines, contact a social worker or your clinic. There are many programs to assist with medicine costs.  Talk to your health care provider before starting any new medicines or supplements.  Make sure your health care provider and pharmacist have a list of all the medicines you are taking. Eating and drinking   Avoid drinking alcohol.  Eat a heart-healthy diet as told by your health care provider. This includes: ? Plenty of fruits and vegetables. ? Lean proteins. ? Low-fat dairy. ? Whole grains. ? Foods that are low in sodium. Activity   Exercise regularly as told by your health care provider. Balance exercise with rest.  Ask your health care provider what activities are safe for you. This includes sexual activity, exercise, and daily tasks at home or work. Lifestyle  Do not use any products that contain nicotine or tobacco, such as cigarettes and e-cigarettes. If you need help quitting, ask your health care provider.  Maintain a healthy weight. Ask your health care provider what weight is healthy for you.  Consider joining a patient support group. This can help with emotional problems you may have, such as stress and anxiety. General instructions  Talk to your health  care provider about flu and pneumonia vaccines.  Keep a list of medicines that you are taking. This may help in emergency situations.  Keep all follow-up visits as told by your health care provider. This is important. Contact a health care provider if:  You have questions about your medicines or you miss a dose.  You feel anxious, depressed, or stressed.  You have swelling in your feet, ankles, legs, or abdomen.  You have shortness of breath during activity or exercise.  You have a cough.  You have a fever.  You have trouble sleeping.  You gain 2-3 lb (1-1.4 kg) in 24 hours or 5 lb (2.3 kg) in a week. Get help right away if:  You have chest pain.  You have shortness of breath while resting.  You have severe fatigue.  You are confused.  You have severe dizziness.  You have a rapid or irregular heartbeat.  You have nausea or you vomit.  You have a cough that is worse at night or you cannot lie flat.  You have a cough that will not go away.  You have severe depression or sadness. Summary  When heart failure symptoms get worse, it is called heart failure exacerbation.  Common causes of this condition include taking medicines incorrectly, infections, and drinking alcohol.  This condition may be treated by adjusting medicines, maintaining a healthy lifestyle, or surgery.  Do not stop taking your medicines or change the amount you take. If you are having problems or side effects from your medicines, talk to your health care provider. This information is not intended to replace advice given to you by your health care provider. Make sure you discuss any questions you have  with your health care provider. Document Released: 03/26/2017 Document Revised: 10/25/2017 Document Reviewed: 03/26/2017 Elsevier Patient Education  2020 [employer].

## 2019-10-18 LAB — ECHO INTRAOPERATIVE TEE
Height: 68 in
Weight: 2320 oz

## 2019-10-26 ENCOUNTER — Other Ambulatory Visit (HOSPITAL_COMMUNITY): Payer: Self-pay

## 2019-10-26 ENCOUNTER — Ambulatory Visit (INDEPENDENT_AMBULATORY_CARE_PROVIDER_SITE_OTHER): Payer: Self-pay | Admitting: Surgical

## 2019-10-26 ENCOUNTER — Other Ambulatory Visit (HOSPITAL_COMMUNITY)
Admission: RE | Admit: 2019-10-26 | Discharge: 2019-10-26 | Disposition: A | Discharge status: Home / Self Care | Payer: PRIVATE HEALTH INSURANCE | Source: Ambulatory Visit | Attending: Vascular Surgery | Admitting: Vascular Surgery

## 2019-10-26 ENCOUNTER — Ambulatory Visit
Admission: RE | Admit: 2019-10-26 | Discharge: 2019-10-26 | Disposition: A | Discharge status: Home / Self Care | Payer: PRIVATE HEALTH INSURANCE | Source: Ambulatory Visit | Attending: Cardiothoracic Surgery | Admitting: Cardiothoracic Surgery

## 2019-10-26 ENCOUNTER — Other Ambulatory Visit: Payer: Self-pay

## 2019-10-26 ENCOUNTER — Ambulatory Visit: Payer: Self-pay | Admitting: Thoracic Surgery (Cardiothoracic Vascular Surgery)

## 2019-10-26 VITALS — BP 133/73 | HR 75 | Temp 97.7°F | Resp 20 | Ht 66.0 in | Wt 152.0 lb

## 2019-10-26 DIAGNOSIS — Z20828 Contact with and (suspected) exposure to other viral communicable diseases: Secondary | ICD-10-CM | POA: Insufficient documentation

## 2019-10-26 DIAGNOSIS — I314 Cardiac tamponade: Secondary | ICD-10-CM

## 2019-10-26 DIAGNOSIS — Z953 Presence of xenogenic heart valve: Secondary | ICD-10-CM

## 2019-10-26 DIAGNOSIS — Z01812 Encounter for preprocedural laboratory examination: Secondary | ICD-10-CM | POA: Insufficient documentation

## 2019-10-26 DIAGNOSIS — Z09 Encounter for follow-up examination after completed treatment for conditions other than malignant neoplasm: Secondary | ICD-10-CM

## 2019-10-26 NOTE — Assessment & Plan Note (Signed)
Steady recovery

## 2019-10-26 NOTE — Patient Instructions (Signed)
Instructions giver

## 2019-10-26 NOTE — Progress Notes (Signed)
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, ascending aortic aneurysm, 2-vessel Coronary Artery Disease  Postoperative Diagnosis:    Same  Procedure:       Biobentall procedure (27 mm KONECT Resilia bioprosthetic) Distal hemiarch reconstruction with 30 mm Dacron graft under hypothermic circulatory arrest (18 min) with 17 min antegrade cerebral perfusion); right axillary cannulation Coronary Artery Bypass Grafting x 2              Saphenous Vein Graft to Posterior Descending Coronary Artery, Saphenous Vein Graft to Obtuse Marginal Branch of Left Circumflex Coronary Artery; Endoscopic Vein Harvest from right Thigh and Lower Leg; completion graft surveillance with indocyanine green fluorescence imaging (SPY)  Surgeon:        B. Murvin Natal, MD  Assistant:       Enid Cutter MD  Anesthesia:    get  Operative Findings: ? depressed left ventricular systolic function; dilated and thickened LV ? good quality saphenous vein conduit ? good quality target vessels for grafting  CARDIOTHORACIC SURGERY OPERATIVE NOTE  Date of Procedure:09/25/2019  Preoperative Diagnosis:  Pericardial Tamponade he is doing well home tomorrow  Postoperative Diagnosis:  Pericardial Tamponade  Procedure:   Emergency Redo Median Sternotomy  Evacuation of Mediastinal Hematoma  Ligation of Saphenous Vein Grafts x2 using Cardiopulmonary Bypass  Surgeon:Clarence H. Roxy Manns, MD  Assistant:Donielle Liston Alba, PA-C   Anesthesia:Chris Ermalene Postin, MD  Operative Findings: ? Massive hemopericardium with old clot and acute hemorrhage ? Active bleeding from proximal anastomosis of both saphenous vein grafts  History of Present Illness:    The patient is a 63 year old man status post the above procedures seen in the office on today's date and routine postsurgical follow-up.  Overall, he reports that he is doing quite well.  He is not having any significant shortness of breath or chest pain.  His incisions are healing well, with only slight drainage from the original [facility] site.  He denies fevers, chills or other significant constitutional symptoms.  He is not having palpitations.  He is having only lower extremity edema.  Overall he seems to be quite pleased with his recovery.      Past Medical History:  Diagnosis Date  . Allergy    allergic rhinitis  . Aortic root dilatation ([place])    a. s/p surgical repair 08/27/2019  . Asthma    mostly in childhood  . Bicuspid aortic valve    a.  Status post bioprosthetic replacement 08/27/2019  . BPH (benign prostatic hypertrophy)   . CAD (coronary artery disease)    a. s/p 2-v CABG 08/2019 (SVG-OM, SVG--rPDA)  . GERD (gastroesophageal reflux disease)   . History of kidney stones   . Hypertension   . Hypertriglyceridemia   . Hypothyroidism   . Pericardial tamponade 09/25/2019  . Severe aortic regurgitation    a. bicuspid aortic valve, b. s/p bio Bentall procedure 08/2019     Social History   Tobacco Use  Smoking Status Former Smoker  . Types:  Cigarettes  . Quit date: 11/26/1978  . Years since quitting: 40.9  Smokeless Tobacco Never Used  Tobacco Comment   social smoked till 58's    Social History   Substance and Sexual Activity  Alcohol Use No  . Alcohol/week: 0.0 standard drinks   Comment: none since 1984     Allergies  Allergen Reactions  . Tetracycline Hcl Other (See Comments)    Joint pain (and stiffened them, also)  . Beta Adrenergic  Blockers     Patient experienced abdomen vasospasms with Metoprolol succinate/tartrate and carvedilol.  . Metoprolol Tartrate     Asthma and cough  . Amlodipine Other (See Comments)    10mg  dose causes lower extremity swelling. Pt can tolerate 5mg  dose.    Current Outpatient Medications  Medication Sig Dispense Refill  . acetaminophen (TYLENOL) 325 MG tablet Take 2 tablets (650 mg total) by mouth every 6 (six) hours as needed for mild pain.    Marland Kitchen albuterol (VENTOLIN HFA) 108 (90 Base) MCG/ACT inhaler Inhale 2 puffs into the lungs 3 (three) times daily as needed for wheezing or shortness of breath.    Marland Kitchen amiodarone (PACERONE) 200 MG tablet Take 1 tablet (200 mg total) by mouth daily. 30 tablet 0  . aspirin EC 81 MG EC tablet Take 1 tablet (81 mg total) by mouth daily. 30 tablet 0  . atorvastatin (LIPITOR) 80 MG tablet Take 1 tablet (80 mg total) by mouth daily at 6 PM. 30 tablet 0  . benzonatate (TESSALON PERLES) 100 MG capsule Take 1 capsule (100 mg total) by mouth 3 (three) times daily. 30 capsule 2  . cetirizine (ZYRTEC) 10 MG tablet Take 10 mg by mouth daily.    . ferrous sulfate 325 (65 FE) MG EC tablet Take 325 mg by mouth daily with breakfast.    . Fluticasone-Salmeterol (ADVAIR) 100-50 MCG/DOSE AEPB Inhale 1 puff into the lungs 2 (two) times daily. 60 each 11  . Glucosamine-Chondroitin (COSAMIN DS PO) Take 1 tablet by mouth daily.    Marland Kitchen levothyroxine (SYNTHROID) 125 MCG tablet Take 1 tablet by mouth once daily (Patient taking differently: Take 125 mcg by mouth daily before breakfast. ) 90 tablet 3   No current facility-administered medications for this visit.        Physical Exam: BP 133/73   Pulse 75   Temp 97.7 F (36.5 C) (Skin)   Resp 20   Ht 5\' 6"  (1.676 m)   Wt 152 lb (68.9 kg)   SpO2 97% Comment: RA  BMI 24.53 kg/m   General appearance: alert, cooperative and no distress Heart: regular rate and rhythm Lungs: clear to auscultation bilaterally Abdomen: benign  Extremities: trace edema Wound: incis healing well   Diagnostic Studies & Laboratory data:     Recent Radiology Findings:   Dg Chest 2 View  Result Date: 10/26/2019 CLINICAL DATA:  Post aortic valve replacement. EXAM: CHEST - 2 VIEW COMPARISON:  October 01, 2019 FINDINGS: The heart size remains enlarged. The patient is status post prior valve replacement and median sternotomy. There is persistent blunting of the left costophrenic angle which has improved from prior study. There is no pneumothorax. There is scarring in the left upper lobe which is similar to prior study. There is a trace right-sided effusion, improved from prior study. IMPRESSION: 1. Improving bilateral pleural effusions. 2. Persistent cardiomegaly. Electronically Signed   By: Constance Holster M.D.   On: 10/26/2019 14:55      Recent Lab Findings: Lab Results  Component Value Date   WBC 8.6 10/09/2019   HGB 10.6 (L) 10/09/2019   HCT 32.1 (L) 10/09/2019   PLT 514.0 (H) 10/09/2019   GLUCOSE 86 10/09/2019   CHOL 184 04/02/2019   TRIG 207.0 (H) 04/02/2019   HDL 29.60 (L) 04/02/2019   LDLDIRECT 107.0 04/02/2019   LDLCALC 103 (H) 05/26/2012   ALT 83 (H) 09/23/2019   AST 100 (H) 09/23/2019   NA 140 10/09/2019   K 4.7 10/09/2019   CL 102 10/09/2019   CREATININE 0.79 10/09/2019   BUN 12 10/09/2019   CO2 29 10/09/2019   TSH 42.81 (H) 10/09/2019   INR 1.8 (H) 09/25/2019   HGBA1C 5.5 08/25/2019      Assessment / Plan: The patient is doing quite well overall in his recovery following both of the above described procedures.  His cardiologist, Dr. Rockey Situ does feel that he would benefit from stenting the coronaries that are no longer bypass due to proximal ligation of the grafts due to the bleeding.  We will see the patient again in approximately 6 weeks to reevaluate his overall recovery.  He is scheduled to have his small pseudoaneurysm of the femoral artery repaired by Dr. Loletta Specter later this week.  I gave him further  instructions regarding activity progression as well as driving.  He knows to obtain medical care should he should have any new or worsening cardiac symptoms.      Medication Changes: No orders of the defined types were placed in this encounter.     Jeffrey Giovanni, PA-C 10/26/2019 3:18 PM

## 2019-10-27 LAB — NOVEL CORONAVIRUS, NAA (HOSP ORDER, SEND-OUT TO REF LAB; TAT 18-24 HRS): SARS-CoV-2, NAA: NOT DETECTED

## 2019-10-28 ENCOUNTER — Other Ambulatory Visit: Payer: Self-pay

## 2019-10-28 ENCOUNTER — Encounter (HOSPITAL_COMMUNITY): Payer: Self-pay | Admitting: *Deleted

## 2019-10-28 NOTE — Progress Notes (Signed)
Anesthesia Chart Review: Same day workup  Hx of bicuspid aortic valve,ascending aortic aneurysm,CAD, s/p biological Bentall aortic root replacement and coronary artery bypass grafting x2 on 08/27/19. He was discharged from the hospital on the seventh postoperative day. He was readmitted to the hospital September 16, 2019 with severe chest pain and tachycardia. CT scan and transthoracic echocardiogram revealed slight increase in the amount of pericardial effusion and/or blood in the mediastinum. 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 During his hospitalization he had a brief episode of atrial fibrillation for which he was treated with amiodarone. He promptly converted to sinus rhythm and was not started on oral anticoagulation. On the morning of September 23, 2019 had a brief episode of hypotension which resolved without significant intervention. He was discharged from the hospital 09/14/19. On 09/25/19 pt suffered out of hospital arrest. EMS was called and brought him directly to the emergency room where he was unresponsive on arrival. EKG revealed diffuse ST segment changes suspicious for inferior wall ischemia. He was seen urgently by Dr Roxy Manns and was taken urgently to the OR for Emergency Redo Median Sternotomy, Evacuation of Mediastinal Hematoma, Ligation of Saphenous Vein Grafts x2 using Cardiopulmonary Bypass.  Followed up with cardiologist Dr. Rockey Situ 10/06/19. Per OV note he has stable angina and may need PCI to address the coronaries that are no longer bypass due to proximal ligation of the grafts due to the bleeding. The pt would like to wait/recover before undergoing any further cardiac intervention. Dr. Rockey Situ advised to f/u in 3 months.  Pt followed up with [facility] 10/26/19. Per OV note "The patient is doing quite well overall in his recovery following both of the above described procedures.  His cardiologist, Dr. Rockey Situ does feel that he would benefit from stenting the  coronaries that are no longer bypass due to proximal ligation of the grafts due to the bleeding.  We will see the patient again in approximately 6 weeks to reevaluate his overall recovery.  He is scheduled to have his small pseudoaneurysm of the femoral artery repaired by Dr. Loletta Specter later this week."  Will need DOS labs and eval.   EKG 10/06/19: NSR. Rate 82. Possible LAE. LVH with repol abnormality.    Wynonia Musty [facility]/Anesthesiology Phone [phone] 10/28/2019 11:30 AM

## 2019-10-28 NOTE — Telephone Encounter (Signed)
On last clinic visit, He was recovering from surgery A cath with PCI might be needed at some point Timing was up to him. He may want to meet with Dr. Fletcher Anon or End to go over films in clinic

## 2019-10-28 NOTE — Progress Notes (Signed)
Pt denies any acute cardiopulmonary issues. Pt stated that he is under the care of Dr. Rockey Situ, Cardiology and Dr. Silvio Pate, PCP. Pt denies having a stress test. Pt made aware to stop taking Glucosamine, vitamins, fish oil and herbal medications. Do not take any NSAIDs ie: Ibuprofen, Advil, Naproxen (Aleve), Motrin, BC and Goody Powder. Pt verbalized understanding of all pre-op instructions. PA, Anesthesiology, asked to review pt history.

## 2019-10-28 NOTE — Anesthesia Preprocedure Evaluation (Addendum)
Anesthesia Evaluation  Patient identified by MRN, date of birth, ID band Patient awake    Reviewed: Allergy & Precautions, NPO status , Patient's Chart, lab work & pertinent test results  Airway Mallampati: II  TM Distance: >3 FB Neck ROM: Full    Dental no notable dental hx. (+) Teeth Intact   Pulmonary asthma , former smoker,    Pulmonary exam normal breath sounds clear to auscultation       Cardiovascular hypertension, + CAD and + CABG  Normal cardiovascular exam+ Valvular Problems/Murmurs  Rhythm:Regular Rate:Normal  09/17/19 Echo  EF 45-50%    Neuro/Psych negative neurological ROS     GI/Hepatic negative GI ROS, Neg liver ROS, Contrast Agents:  ,  Endo/Other  Hypothyroidism   Renal/GU negative Renal ROSK+ 4.7 Cr 0.79     Musculoskeletal negative musculoskeletal ROS (+)   Abdominal   Peds  Hematology HLD Hgb 10.6 Plt 581   Anesthesia Other Findings right femoral pseudoaneurysm  All: B Blockers , amlodipine, Tetracycline  Reproductive/Obstetrics                          Anesthesia Physical Anesthesia Plan  ASA: III  Anesthesia Plan: General   Post-op Pain Management:    Induction: Intravenous  PONV Risk Score and Plan: 3 and Treatment may vary due to age or medical condition, Ondansetron and Dexamethasone  Airway Management Planned: LMA  Additional Equipment: None  Intra-op Plan:   Post-operative Plan:   Informed Consent: I have reviewed the patients History and Physical, chart, labs and discussed the procedure including the risks, benefits and alternatives for the proposed anesthesia with the patient or authorized representative who has indicated his/her understanding and acceptance.     Dental advisory given  Plan Discussed with: CRNA  Anesthesia Plan Comments: (Per PA-C: Hx of bicuspid aortic valve,ascending aortic aneurysm,CAD, s/p biological Bentall aortic  root replacement and coronary artery bypass grafting x2 on 08/27/19. He was discharged from the hospital on the seventh postoperative day. He was readmitted to the hospital September 16, 2019 with severe chest pain and tachycardia. CT scan and transthoracic echocardiogram revealed slight increase in the amount of pericardial effusion and/or blood in the mediastinum. 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 During his hospitalization he had a brief episode of atrial fibrillation for which he was treated with amiodarone. He promptly converted to sinus rhythm and was not started on oral anticoagulation. On the morning of September 23, 2019 had a brief episode of hypotension which resolved without significant intervention. He was discharged from the hospital 09/14/19. On 09/25/19 pt suffered out of hospital arrest. EMS was called and brought him directly to the emergency room where he was unresponsive on arrival. EKG revealed diffuse ST segment changes suspicious for inferior wall ischemia. He was seen urgently by Dr Roxy Manns and was taken urgently to the OR for Emergency Redo Median Sternotomy, Evacuation of Mediastinal Hematoma, Ligation of Saphenous Vein Grafts x2 using Cardiopulmonary Bypass.  Followed up with cardiologist Dr. Rockey Situ 10/06/19. Per OV note he has stable angina and may need PCI to address the coronaries that are no longer bypass due to proximal ligation of the grafts due to the bleeding. The pt would like to wait/recover before undergoing any further cardiac intervention. Dr. Rockey Situ advised to f/u in 3 months.  Pt followed up with [facility] 10/26/19. Per OV note "The patient is doing quite well overall in his recovery following both of the above described procedures.  His cardiologist, Dr. Rockey Situ does feel that he would benefit from stenting the coronaries that are no longer bypass due to proximal ligation of the grafts due to the bleeding.  We will see the patient again in  approximately 6 weeks to reevaluate his overall recovery.  He is scheduled to have his small pseudoaneurysm of the femoral artery repaired by Dr. Loletta Specter later this week."  Will need DOS labs and eval.   EKG 10/06/19: NSR. Rate 82. Possible LAE. LVH with repol abnormality. )      Anesthesia Quick Evaluation

## 2019-10-29 ENCOUNTER — Encounter (HOSPITAL_COMMUNITY)
Admission: RE | Disposition: A | Discharge status: Home / Self Care | Payer: Self-pay | Source: Home / Self Care | Attending: Vascular Surgery

## 2019-10-29 ENCOUNTER — Inpatient Hospital Stay (HOSPITAL_COMMUNITY): Payer: PRIVATE HEALTH INSURANCE | Admitting: Physician Assistant

## 2019-10-29 ENCOUNTER — Encounter (HOSPITAL_COMMUNITY): Payer: Self-pay

## 2019-10-29 ENCOUNTER — Inpatient Hospital Stay (HOSPITAL_COMMUNITY)
Admission: RE | Admit: 2019-10-29 | Discharge: 2019-10-30 | DRG: 253 | Disposition: A | Discharge status: Home / Self Care | Payer: PRIVATE HEALTH INSURANCE | Attending: Vascular Surgery | Admitting: Vascular Surgery

## 2019-10-29 DIAGNOSIS — Z9842 Cataract extraction status, left eye: Secondary | ICD-10-CM | POA: Diagnosis not present

## 2019-10-29 DIAGNOSIS — Z951 Presence of aortocoronary bypass graft: Secondary | ICD-10-CM | POA: Diagnosis not present

## 2019-10-29 DIAGNOSIS — J309 Allergic rhinitis, unspecified: Secondary | ICD-10-CM | POA: Diagnosis present

## 2019-10-29 DIAGNOSIS — Z87891 Personal history of nicotine dependence: Secondary | ICD-10-CM

## 2019-10-29 DIAGNOSIS — Z7951 Long term (current) use of inhaled steroids: Secondary | ICD-10-CM | POA: Diagnosis not present

## 2019-10-29 DIAGNOSIS — Z888 Allergy status to other drugs, medicaments and biological substances status: Secondary | ICD-10-CM

## 2019-10-29 DIAGNOSIS — Z825 Family history of asthma and other chronic lower respiratory diseases: Secondary | ICD-10-CM

## 2019-10-29 DIAGNOSIS — I110 Hypertensive heart disease with heart failure: Secondary | ICD-10-CM | POA: Diagnosis present

## 2019-10-29 DIAGNOSIS — I5032 Chronic diastolic (congestive) heart failure: Secondary | ICD-10-CM | POA: Diagnosis present

## 2019-10-29 DIAGNOSIS — Z87442 Personal history of urinary calculi: Secondary | ICD-10-CM

## 2019-10-29 DIAGNOSIS — Z881 Allergy status to other antibiotic agents status: Secondary | ICD-10-CM

## 2019-10-29 DIAGNOSIS — Z79890 Hormone replacement therapy (postmenopausal): Secondary | ICD-10-CM | POA: Diagnosis not present

## 2019-10-29 DIAGNOSIS — Z9841 Cataract extraction status, right eye: Secondary | ICD-10-CM

## 2019-10-29 DIAGNOSIS — Z961 Presence of intraocular lens: Secondary | ICD-10-CM | POA: Diagnosis present

## 2019-10-29 DIAGNOSIS — Z8042 Family history of malignant neoplasm of prostate: Secondary | ICD-10-CM

## 2019-10-29 DIAGNOSIS — Z8679 Personal history of other diseases of the circulatory system: Secondary | ICD-10-CM | POA: Diagnosis not present

## 2019-10-29 DIAGNOSIS — E781 Pure hyperglyceridemia: Secondary | ICD-10-CM | POA: Diagnosis present

## 2019-10-29 DIAGNOSIS — I724 Aneurysm of artery of lower extremity: Principal | ICD-10-CM | POA: Diagnosis present

## 2019-10-29 DIAGNOSIS — Z20828 Contact with and (suspected) exposure to other viral communicable diseases: Secondary | ICD-10-CM | POA: Diagnosis present

## 2019-10-29 DIAGNOSIS — J4520 Mild intermittent asthma, uncomplicated: Secondary | ICD-10-CM | POA: Diagnosis present

## 2019-10-29 DIAGNOSIS — Z7982 Long term (current) use of aspirin: Secondary | ICD-10-CM | POA: Diagnosis not present

## 2019-10-29 DIAGNOSIS — I2510 Atherosclerotic heart disease of native coronary artery without angina pectoris: Secondary | ICD-10-CM | POA: Diagnosis present

## 2019-10-29 DIAGNOSIS — Z952 Presence of prosthetic heart valve: Secondary | ICD-10-CM

## 2019-10-29 DIAGNOSIS — Z79899 Other long term (current) drug therapy: Secondary | ICD-10-CM

## 2019-10-29 DIAGNOSIS — Z800 Family history of malignant neoplasm of digestive organs: Secondary | ICD-10-CM | POA: Diagnosis not present

## 2019-10-29 DIAGNOSIS — Z9049 Acquired absence of other specified parts of digestive tract: Secondary | ICD-10-CM

## 2019-10-29 DIAGNOSIS — E039 Hypothyroidism, unspecified: Secondary | ICD-10-CM | POA: Diagnosis present

## 2019-10-29 HISTORY — DX: Presence of spectacles and contact lenses: Z97.3

## 2019-10-29 HISTORY — PX: FALSE ANEURYSM REPAIR: SHX5152

## 2019-10-29 LAB — URINALYSIS, ROUTINE W REFLEX MICROSCOPIC
Bacteria, UA: NONE SEEN
Bilirubin Urine: NEGATIVE
Glucose, UA: NEGATIVE mg/dL
Ketones, ur: NEGATIVE mg/dL
Leukocytes,Ua: NEGATIVE
Nitrite: NEGATIVE
Protein, ur: NEGATIVE mg/dL
Specific Gravity, Urine: 1.023 (ref 1.005–1.030)
pH: 5 (ref 5.0–8.0)

## 2019-10-29 LAB — CBC
HCT: 38.4 % — ABNORMAL LOW (ref 39.0–52.0)
Hemoglobin: 12.2 g/dL — ABNORMAL LOW (ref 13.0–17.0)
MCH: 30.3 pg (ref 26.0–34.0)
MCHC: 31.8 g/dL (ref 30.0–36.0)
MCV: 95.3 fL (ref 80.0–100.0)
Platelets: 328 10*3/uL (ref 150–400)
RBC: 4.03 MIL/uL — ABNORMAL LOW (ref 4.22–5.81)
RDW: 15.7 % — ABNORMAL HIGH (ref 11.5–15.5)
WBC: 8.4 10*3/uL (ref 4.0–10.5)
nRBC: 0 % (ref 0.0–0.2)

## 2019-10-29 LAB — COMPREHENSIVE METABOLIC PANEL
ALT: 16 U/L (ref 0–44)
AST: 20 U/L (ref 15–41)
Albumin: 3.8 g/dL (ref 3.5–5.0)
Alkaline Phosphatase: 119 U/L (ref 38–126)
Anion gap: 11 (ref 5–15)
BUN: 8 mg/dL (ref 8–23)
CO2: 25 mmol/L (ref 22–32)
Calcium: 8.9 mg/dL (ref 8.9–10.3)
Chloride: 102 mmol/L (ref 98–111)
Creatinine, Ser: 0.73 mg/dL (ref 0.61–1.24)
GFR calc Af Amer: >60 mL/min (ref >60)
GFR calc non Af Amer: >60 mL/min (ref >60)
Glucose, Bld: 92 mg/dL (ref 70–99)
Potassium: 4 mmol/L (ref 3.5–5.1)
Sodium: 138 mmol/L (ref 135–145)
Total Bilirubin: 0.9 mg/dL (ref 0.3–1.2)
Total Protein: 7.6 g/dL (ref 6.5–8.1)

## 2019-10-29 LAB — SURGICAL PCR SCREEN
MRSA, PCR: NEGATIVE
Staphylococcus aureus: NEGATIVE

## 2019-10-29 LAB — PROTIME-INR
INR: 1.1 (ref 0.8–1.2)
Prothrombin Time: 14.4 seconds (ref 11.4–15.2)

## 2019-10-29 LAB — APTT: aPTT: 33 seconds (ref 24–36)

## 2019-10-29 SURGERY — REPAIR, PSEUDOANEURYSM
Anesthesia: General | Site: Leg Upper | Laterality: Right

## 2019-10-29 MED ORDER — DEXAMETHASONE SODIUM PHOSPHATE 10 MG/ML IJ SOLN
INTRAMUSCULAR | Status: DC | PRN
  Administered 2019-10-29: 10 mg via INTRAVENOUS

## 2019-10-29 MED ORDER — MIDAZOLAM HCL 2 MG/2ML IJ SOLN
INTRAMUSCULAR | Status: AC
  Filled 2019-10-29: qty 2

## 2019-10-29 MED ORDER — SODIUM CHLORIDE 0.9 % IV SOLN: INTRAVENOUS | Status: DC

## 2019-10-29 MED ORDER — DEXAMETHASONE SODIUM PHOSPHATE 10 MG/ML IJ SOLN
INTRAMUSCULAR | Status: AC
  Filled 2019-10-29: qty 1

## 2019-10-29 MED ORDER — FERROUS SULFATE 325 (65 FE) MG PO TABS
325.0000 mg | ORAL_TABLET | Freq: Every day | ORAL | Status: DC
  Administered 2019-10-30: 325 mg via ORAL
  Filled 2019-10-29: qty 1

## 2019-10-29 MED ORDER — MUPIROCIN 2 % EX OINT
TOPICAL_OINTMENT | CUTANEOUS | Status: AC
  Administered 2019-10-29: 1 via TOPICAL
  Filled 2019-10-29: qty 22

## 2019-10-29 MED ORDER — PROPOFOL 10 MG/ML IV BOLUS
INTRAVENOUS | Status: DC | PRN
  Administered 2019-10-29: 140 mg via INTRAVENOUS

## 2019-10-29 MED ORDER — FENTANYL CITRATE (PF) 250 MCG/5ML IJ SOLN
INTRAMUSCULAR | Status: AC
  Filled 2019-10-29: qty 5

## 2019-10-29 MED ORDER — SODIUM CHLORIDE 0.9 % IV SOLN: 500.0000 mL | Freq: Once | INTRAVENOUS | Status: DC | PRN

## 2019-10-29 MED ORDER — AMIODARONE HCL 200 MG PO TABS
200.0000 mg | ORAL_TABLET | Freq: Every day | ORAL | Status: DC
  Administered 2019-10-30: 200 mg via ORAL
  Filled 2019-10-29: qty 1

## 2019-10-29 MED ORDER — CEFAZOLIN SODIUM-DEXTROSE 2-4 GM/100ML-% IV SOLN
2.0000 g | INTRAVENOUS | Status: AC
  Administered 2019-10-29: 2 g via INTRAVENOUS

## 2019-10-29 MED ORDER — CHLORHEXIDINE GLUCONATE CLOTH 2 % EX PADS: 6.0000 | MEDICATED_PAD | Freq: Once | CUTANEOUS | Status: DC

## 2019-10-29 MED ORDER — PROPOFOL 10 MG/ML IV BOLUS
INTRAVENOUS | Status: AC
  Filled 2019-10-29: qty 20

## 2019-10-29 MED ORDER — MUPIROCIN 2 % EX OINT
1.0000 "application " | TOPICAL_OINTMENT | Freq: Once | CUTANEOUS | Status: AC
  Administered 2019-10-29: 09:00:00 1 via TOPICAL

## 2019-10-29 MED ORDER — DOCUSATE SODIUM 100 MG PO CAPS
100.0000 mg | ORAL_CAPSULE | Freq: Every day | ORAL | Status: DC
  Administered 2019-10-30: 100 mg via ORAL
  Filled 2019-10-29: qty 1

## 2019-10-29 MED ORDER — OXYCODONE-ACETAMINOPHEN 5-325 MG PO TABS: 1.0000 | ORAL_TABLET | ORAL | Status: DC | PRN

## 2019-10-29 MED ORDER — CEFAZOLIN SODIUM-DEXTROSE 2-4 GM/100ML-% IV SOLN
2.0000 g | Freq: Three times a day (TID) | INTRAVENOUS | Status: AC
  Administered 2019-10-29 – 2019-10-30 (×2): 2 g via INTRAVENOUS
  Filled 2019-10-29 (×2): qty 100

## 2019-10-29 MED ORDER — LACTATED RINGERS IV SOLN
INTRAVENOUS | Status: DC
  Administered 2019-10-29 (×2): via INTRAVENOUS

## 2019-10-29 MED ORDER — PANTOPRAZOLE SODIUM 40 MG PO TBEC
40.0000 mg | DELAYED_RELEASE_TABLET | Freq: Every day | ORAL | Status: DC
  Administered 2019-10-29 – 2019-10-30 (×2): 40 mg via ORAL
  Filled 2019-10-29 (×2): qty 1

## 2019-10-29 MED ORDER — ONDANSETRON HCL 4 MG/2ML IJ SOLN: 4.0000 mg | Freq: Four times a day (QID) | INTRAMUSCULAR | Status: DC | PRN

## 2019-10-29 MED ORDER — ONDANSETRON HCL 4 MG/2ML IJ SOLN: 4.0000 mg | Freq: Once | INTRAMUSCULAR | Status: DC | PRN

## 2019-10-29 MED ORDER — FENTANYL CITRATE (PF) 100 MCG/2ML IJ SOLN
INTRAMUSCULAR | Status: DC | PRN
  Administered 2019-10-29: 50 ug via INTRAVENOUS

## 2019-10-29 MED ORDER — SODIUM CHLORIDE 0.9 % IV SOLN
INTRAVENOUS | Status: DC
  Administered 2019-10-29 – 2019-10-30 (×2): via INTRAVENOUS

## 2019-10-29 MED ORDER — ONDANSETRON HCL 4 MG/2ML IJ SOLN
INTRAMUSCULAR | Status: DC | PRN
  Administered 2019-10-29: 4 mg via INTRAVENOUS

## 2019-10-29 MED ORDER — LIDOCAINE 2% (20 MG/ML) 5 ML SYRINGE
INTRAMUSCULAR | Status: DC | PRN
  Administered 2019-10-29: 100 mg via INTRAVENOUS

## 2019-10-29 MED ORDER — 0.9 % SODIUM CHLORIDE (POUR BTL) OPTIME
TOPICAL | Status: DC | PRN
  Administered 2019-10-29: 1000 mL

## 2019-10-29 MED ORDER — ATORVASTATIN CALCIUM 80 MG PO TABS
80.0000 mg | ORAL_TABLET | Freq: Every day | ORAL | Status: DC
  Administered 2019-10-29: 80 mg via ORAL
  Filled 2019-10-29: qty 1

## 2019-10-29 MED ORDER — ACETAMINOPHEN 325 MG PO TABS: 325.0000 mg | ORAL_TABLET | ORAL | Status: DC | PRN

## 2019-10-29 MED ORDER — ACETAMINOPHEN 160 MG/5ML PO SOLN: 325.0000 mg | ORAL | Status: DC | PRN

## 2019-10-29 MED ORDER — ALUM & MAG HYDROXIDE-SIMETH 200-200-20 MG/5ML PO SUSP: 15.0000 mL | ORAL | Status: DC | PRN

## 2019-10-29 MED ORDER — POTASSIUM CHLORIDE CRYS ER 20 MEQ PO TBCR: 20.0000 meq | EXTENDED_RELEASE_TABLET | Freq: Every day | ORAL | Status: DC | PRN

## 2019-10-29 MED ORDER — SODIUM CHLORIDE 0.9 % IV SOLN
INTRAVENOUS | Status: DC | PRN
  Administered 2019-10-29: 500 mL

## 2019-10-29 MED ORDER — ACETAMINOPHEN 500 MG PO TABS
1000.0000 mg | ORAL_TABLET | Freq: Once | ORAL | Status: AC
  Administered 2019-10-29: 1000 mg via ORAL
  Filled 2019-10-29: qty 2

## 2019-10-29 MED ORDER — PHENOL 1.4 % MT LIQD: 1.0000 | OROMUCOSAL | Status: DC | PRN

## 2019-10-29 MED ORDER — ACETAMINOPHEN 325 MG RE SUPP: 325.0000 mg | RECTAL | Status: DC | PRN

## 2019-10-29 MED ORDER — LABETALOL HCL 5 MG/ML IV SOLN: 10.0000 mg | INTRAVENOUS | Status: DC | PRN

## 2019-10-29 MED ORDER — ACETAMINOPHEN 500 MG PO TABS: 1000.0000 mg | ORAL_TABLET | Freq: Once | ORAL | Status: DC

## 2019-10-29 MED ORDER — HEPARIN SODIUM (PORCINE) 5000 UNIT/ML IJ SOLN
5000.0000 [IU] | Freq: Three times a day (TID) | INTRAMUSCULAR | Status: DC
  Administered 2019-10-30: 5000 [IU] via SUBCUTANEOUS
  Filled 2019-10-29: qty 1

## 2019-10-29 MED ORDER — ASPIRIN EC 81 MG PO TBEC
81.0000 mg | DELAYED_RELEASE_TABLET | Freq: Every day | ORAL | Status: DC
  Administered 2019-10-30: 81 mg via ORAL
  Filled 2019-10-29 (×2): qty 1

## 2019-10-29 MED ORDER — ALBUTEROL SULFATE (2.5 MG/3ML) 0.083% IN NEBU: 3.0000 mL | INHALATION_SOLUTION | Freq: Three times a day (TID) | RESPIRATORY_TRACT | Status: DC | PRN

## 2019-10-29 MED ORDER — POLYETHYLENE GLYCOL 3350 17 G PO PACK: 17.0000 g | PACK | Freq: Every day | ORAL | Status: DC | PRN

## 2019-10-29 MED ORDER — PHENYLEPHRINE 40 MCG/ML (10ML) SYRINGE FOR IV PUSH (FOR BLOOD PRESSURE SUPPORT)
PREFILLED_SYRINGE | INTRAVENOUS | Status: AC
  Filled 2019-10-29: qty 10

## 2019-10-29 MED ORDER — LIDOCAINE 2% (20 MG/ML) 5 ML SYRINGE
INTRAMUSCULAR | Status: AC
  Filled 2019-10-29: qty 5

## 2019-10-29 MED ORDER — ONDANSETRON HCL 4 MG/2ML IJ SOLN
INTRAMUSCULAR | Status: AC
  Filled 2019-10-29: qty 2

## 2019-10-29 MED ORDER — FENTANYL CITRATE (PF) 100 MCG/2ML IJ SOLN: 25.0000 ug | INTRAMUSCULAR | Status: DC | PRN

## 2019-10-29 MED ORDER — HYDRALAZINE HCL 20 MG/ML IJ SOLN: 5.0000 mg | INTRAMUSCULAR | Status: DC | PRN

## 2019-10-29 MED ORDER — MAGNESIUM SULFATE 2 GM/50ML IV SOLN: 2.0000 g | Freq: Every day | INTRAVENOUS | Status: DC | PRN

## 2019-10-29 MED ORDER — LEVOTHYROXINE SODIUM 25 MCG PO TABS
125.0000 ug | ORAL_TABLET | Freq: Every day | ORAL | Status: DC
  Administered 2019-10-30: 125 ug via ORAL
  Filled 2019-10-29: qty 1

## 2019-10-29 MED ORDER — GUAIFENESIN-DM 100-10 MG/5ML PO SYRP: 15.0000 mL | ORAL_SOLUTION | ORAL | Status: DC | PRN

## 2019-10-29 MED ORDER — SODIUM CHLORIDE 0.9 % IV SOLN
INTRAVENOUS | Status: AC
  Filled 2019-10-29: qty 1.2

## 2019-10-29 SURGICAL SUPPLY — 49 items
ADH SKN CLS APL DERMABOND .7 (GAUZE/BANDAGES/DRESSINGS) ×1
ADH SKN CLS LQ APL DERMABOND (GAUZE/BANDAGES/DRESSINGS) ×1
BANDAGE ESMARK 6X9 LF (GAUZE/BANDAGES/DRESSINGS) IMPLANT
BNDG CMPR 9X6 STRL LF SNTH (GAUZE/BANDAGES/DRESSINGS)
BNDG ELASTIC 4X5.8 VLCR STR LF (GAUZE/BANDAGES/DRESSINGS) IMPLANT
BNDG ESMARK 6X9 LF (GAUZE/BANDAGES/DRESSINGS)
CANISTER SUCT 3000ML PPV (MISCELLANEOUS) ×3 IMPLANT
CLIP VESOCCLUDE MED 24/CT (CLIP) ×3 IMPLANT
CLIP VESOCCLUDE SM WIDE 24/CT (CLIP) ×3 IMPLANT
COVER WAND RF STERILE (DRAPES) ×3 IMPLANT
CUFF TOURN SGL QUICK 18X4 (TOURNIQUET CUFF) IMPLANT
CUFF TOURN SGL QUICK 24 (TOURNIQUET CUFF)
CUFF TOURN SGL QUICK 34 (TOURNIQUET CUFF)
CUFF TOURN SGL QUICK 42 (TOURNIQUET CUFF) IMPLANT
CUFF TRNQT CYL 24X4X16.5-23 (TOURNIQUET CUFF) IMPLANT
CUFF TRNQT CYL 34X4.125X (TOURNIQUET CUFF) IMPLANT
DERMABOND ADHESIVE PROPEN (GAUZE/BANDAGES/DRESSINGS) ×2
DERMABOND ADVANCED (GAUZE/BANDAGES/DRESSINGS) ×2
DERMABOND ADVANCED .7 DNX12 (GAUZE/BANDAGES/DRESSINGS) ×1 IMPLANT
DERMABOND ADVANCED .7 DNX6 (GAUZE/BANDAGES/DRESSINGS) IMPLANT
DRAIN CHANNEL 15F RND FF W/TCR (WOUND CARE) IMPLANT
ELECT REM PT RETURN 9FT ADLT (ELECTROSURGICAL) ×3
ELECTRODE REM PT RTRN 9FT ADLT (ELECTROSURGICAL) ×1 IMPLANT
EVACUATOR SILICONE 100CC (DRAIN) IMPLANT
FELT TEFLON 1X6 (MISCELLANEOUS) ×2 IMPLANT
GLOVE BIO SURGEON STRL SZ7.5 (GLOVE) ×3 IMPLANT
GLOVE BIOGEL PI IND STRL 8 (GLOVE) ×1 IMPLANT
GLOVE BIOGEL PI INDICATOR 8 (GLOVE) ×2
GOWN STRL REUS W/ TWL LRG LVL3 (GOWN DISPOSABLE) ×2 IMPLANT
GOWN STRL REUS W/ TWL XL LVL3 (GOWN DISPOSABLE) ×2 IMPLANT
GOWN STRL REUS W/TWL LRG LVL3 (GOWN DISPOSABLE) ×6
GOWN STRL REUS W/TWL XL LVL3 (GOWN DISPOSABLE) ×6
KIT BASIN OR (CUSTOM PROCEDURE TRAY) ×3 IMPLANT
KIT TURNOVER KIT B (KITS) ×3 IMPLANT
NS IRRIG 1000ML POUR BTL (IV SOLUTION) ×6 IMPLANT
PACK PERIPHERAL VASCULAR (CUSTOM PROCEDURE TRAY) ×3 IMPLANT
PAD ARMBOARD 7.5X6 YLW CONV (MISCELLANEOUS) ×6 IMPLANT
STAPLER VISISTAT 35W (STAPLE) IMPLANT
SUT MNCRL AB 4-0 PS2 18 (SUTURE) ×3 IMPLANT
SUT PROLENE 5 0 C 1 24 (SUTURE) ×5 IMPLANT
SUT PROLENE 6 0 BV (SUTURE) IMPLANT
SUT VIC AB 2-0 CT1 27 (SUTURE) ×3
SUT VIC AB 2-0 CT1 TAPERPNT 27 (SUTURE) ×1 IMPLANT
SUT VIC AB 3-0 SH 27 (SUTURE) ×6
SUT VIC AB 3-0 SH 27X BRD (SUTURE) ×1 IMPLANT
TOWEL GREEN STERILE (TOWEL DISPOSABLE) ×3 IMPLANT
TRAY FOLEY MTR SLVR 16FR STAT (SET/KITS/TRAYS/PACK) ×3 IMPLANT
UNDERPAD 30X30 (UNDERPADS AND DIAPERS) ×3 IMPLANT
WATER STERILE IRR 1000ML POUR (IV SOLUTION) ×3 IMPLANT

## 2019-10-29 NOTE — Transfer of Care (Signed)
Immediate Anesthesia Transfer of Care Note  Patient: Jeffrey Tucker  Procedure(s) Performed: REPAIR FALSE ANEURYSM RIGHT FEMORAL (Right Leg Upper)  Patient Location: PACU  Anesthesia Type:General  Level of Consciousness: responds to stimulation  Airway & Oxygen Therapy: Patient Spontanous Breathing and Patient connected to face mask oxygen  Post-op Assessment: Report given to RN and Post -op Vital signs reviewed and stable  Post vital signs: Reviewed and stable  Last Vitals:  Vitals Value Taken Time  BP 141/82 10/29/19 1141  Temp    Pulse 57 10/29/19 1144  Resp 12 10/29/19 1144  SpO2 100 % 10/29/19 1144  Vitals shown include unvalidated device data.  Last Pain:  Vitals:   10/29/19 0832  TempSrc:   PainSc: 2       Patients Stated Pain Goal: 1 ([id number])  Complications: No apparent anesthesia complications

## 2019-10-29 NOTE — Op Note (Signed)
Date: October 29, 2019  Preoperative diagnosis: Right common femoral artery pseudoaneurysm  Postoperative diagnosis: Same  Procedure: Open repair of right common femoral artery pseudoaneurysm  Surgeon: Dr. Marty Heck, MD  Assistant: Arlee Muslim, PA  Indication: Patient is a 63 year old male with complex medical history following aortic root replacement and CABG x2 on 08/27/2019.  Prior to his CABG he underwent a cardiac cath at [facility] regional via right common femoral artery access. Ultimately during his recent hospitalization vascular surgery was consulted for right common femoral artery pseudoaneurysm that measured about 2.5 cm and from his cardiac cath on 08/21/19 .  There was essentially no neck and this was not amenable to thrombin injection.  We did attempt ultrasound compression for over 30 minutes with only partial thrombosis in the pseudoaneurysm.  He presents today for surgical repair.  Risk and benefits were discussed with the patient.  Findings: There was significant scar in the right groin from the mynx closure device.  Ultimately the pseudoaneurysm cavity was encountered and it was very indurated.  The artery itself where the pseudoaneurysm was located was fairly fragile.  Attempted to put a figure-of-eight 5-0 Prolene in the artery and ultimately this caused the artery to tear slightly.  Ultimately placed two pledgeted sutures with hemostasis and repair of pseudoaneurysm.  He had palpable pedal pulses distally after repair.  Anesthesia: General  Details: Patient was taken to the operating room after informed consent was obtained.  Placed on operative table in supine position.  Right groin was then prepped and draped in usual sterile fashion.  Preoperative timeout was performed identify patient, procedure and site.  Initially made a vertical groin incision in the right groin over the pseudoaneurysm that was palpable.  Dissected down through subcutaneous tissue with Bovie  cautery.  Initially opened the femoral sheath just under the inguinal ligament and got proximal control the common femoral artery circumferentially and placed a vessel loop.  We then dissected inferiorly where we encountered the pseudoaneurysm cavity and ultimately entered the cavity where there was fair amount of chronic thrombus.  We did then encounter bright pulsatile arterial bleeding from the anterior wall of the artery at the level of profunda take off.  Use finger for digital hemostasis over top of the arteriotomy, I then dissected distal in order to get better visualization of the anterior wall the artery and distal control.  I attempted to place a 5-0 Prolene figure-of-eight fashion in the arteriotomy and this caused the artery to tear slightly and artery seemed fairly fragile given the chronic nature of the pseudoaneurysm.  Ultimately used an additional 5-0 Prolene with two pledgets to get good hemostasis and repair of the pseudoaneurysm.  We further explored and did not see any other obvious defects in the artery.  We identified the SFA and the profunda.  The groin was copiously irrigated.  We used Doppler and there was excellent triphasic waveforms in the common femoral SFA and profunda.  Patient still had a palpable pulse in the right foot after we had our team check the right foot.  Ultimately groin was then closed in multiple layers of 2-0 Vicryl, 3-0 Vicryl, 4-0 Monocryl in the skin and Dermabond.  He was taken to PACU in stable condition.  EBL: [id number] mL  Complications: None  Condition: Stable  Marty Heck, MD [facility] Office: [phone] Pager: [place]

## 2019-10-29 NOTE — Anesthesia Procedure Notes (Signed)
Procedure Name: LMA Insertion Date/Time: 10/29/2019 10:29 AM Performed by: Babs Bertin, CRNA Pre-anesthesia Checklist: Patient identified, Emergency Drugs available, Suction available and Patient being monitored Patient Re-evaluated:Patient Re-evaluated prior to induction Oxygen Delivery Method: Circle System Utilized Preoxygenation: Pre-oxygenation with 100% oxygen Induction Type: IV induction Ventilation: Mask ventilation without difficulty LMA: LMA inserted LMA Size: 4.0 Number of attempts: 1 Airway Equipment and Method: Bite block Placement Confirmation: positive ETCO2 Tube secured with: Tape Dental Injury: Teeth and Oropharynx as per pre-operative assessment

## 2019-10-29 NOTE — Progress Notes (Signed)
Pt received from PACU. Pt has level 0 R groin. Pt denies pain. Vitals stable. CHG bath given. Telebox applied/ccmd notified. Pt assisted to bathroom and meal ordered. Pt oriented to room and call bell within reach. Will continue to monitor.  Jerald Kief, RN

## 2019-10-29 NOTE — H&P (Signed)
History and Physical Interval Note:  10/29/2019 10:05 AM  Jeffrey Tucker  has presented today for surgery, with the diagnosis of right femoral pseudoaneurysm.  The various methods of treatment have been discussed with the patient and family. After consideration of risks, benefits and other options for treatment, the patient has consented to  Procedure(s): REPAIR FALSE ANEURYSM RIGHT FEMORAL (Right) as a surgical intervention.  The patient's history has been reviewed, patient examined, no change in status, stable for surgery.  I have reviewed the patient's chart and labs.  Questions were answered to the patient's satisfaction.    Repair right femoral pseudoaneurysm.  Marty Heck  Patient name: Jeffrey Tucker            MRN: [id number]        DOB: December 12, 1955          Sex: male  REASON FOR VISIT: Hospital follow-up for right femoral pseudoaneurysm  HPI: Jeffrey Tucker is a 63 y.o. male with complex medical course following aortic root replacement and CABGx2 on 08/27/19 that presents for hospital follow-up for right femoral pseudoaneurysm.  Patient initially had heart cath via right femoral access at [facility] about 7 weeks ago.  Ultimately vascular surgery was called after his heart surgery here to evaluate a small right femoral pseudoaneurysm during his hospitalization following his CABG.  Ultimately his pseudoaneurysm had no identifiable neck and we were not comfortable with thrombin injection.  He did undergo ultrasound compression for at least 30 minutes with partial thrombosis.  Given that this pseudoaneurysm had been present for 6 weeks we elected to let him go home with short interval follow-up.  No new issues today.  No groin pain.  Leg feels good.      Past Medical History:  Diagnosis Date  . Allergy    allergic rhinitis  . Aortic root dilatation ([place])    a. s/p surgical repair 08/27/2019  . Asthma    mostly in childhood  . Bicuspid aortic valve    a.   Status post bioprosthetic replacement 08/27/2019  . BPH (benign prostatic hypertrophy)   . CAD (coronary artery disease)    a. s/p 2-v CABG 08/2019 (SVG-OM, SVG--rPDA)  . GERD (gastroesophageal reflux disease)   . History of kidney stones   . Hypertension   . Hypertriglyceridemia   . Hypothyroidism   . Pericardial tamponade 09/25/2019  . Severe aortic regurgitation    a. bicuspid aortic valve, b. s/p bio Bentall procedure 08/2019    Past Surgical History:  Procedure Laterality Date  . BENTALL PROCEDURE N/A 08/27/2019   Procedure: BENTALL PROCEDURE with a 27 mm KONECT Resilia Aortic Valved Conduit and 30 mm x 30 cm Hemashield Platinum straight graft.;  Surgeon: Wonda Olds, MD;  Location: [facility];  Service: Open Heart Surgery;  Laterality: N/A;  . CARDIAC CATHETERIZATION  2006   [place]  . CARDIOVASCULAR STRESS TEST  2006   [place]  . CHOLECYSTECTOMY  2006  . CORONARY ARTERY BYPASS GRAFT N/A 08/27/2019   Procedure: CORONARY ARTERY BYPASS GRAFTING (CABG) times 2 using right saphenous endoscopic vein harvesting to OM2 nad PDA.;  Surgeon: Wonda Olds, MD;  Location: [place];  Service: Open Heart Surgery;  Laterality: N/A;  . CYSTOSCOPY W/ URETERAL STENT PLACEMENT  05/24/2017   Procedure: left;  Surgeon: Nickie Retort, MD;  Location: [facility];  Service: Urology;;  . Consuela Mimes W/ URETERAL STENT PLACEMENT Left 06/11/2017   Procedure: CYSTOSCOPY WITH STENT REPLACEMENT;  Surgeon: Erlene Quan,  Caryl Pina, MD;  Location: [facility];  Service: Urology;  Laterality: Left;  . CYSTOSCOPY WITH BIOPSY Left 06/11/2017   Procedure: RENAL PELVIS TUMOR WITH BIOPSY WITH POSSIBLE LASER ABLATION;  Surgeon: Hollice Espy, MD;  Location: [facility];  Service: Urology;  Laterality: Left;  . CYSTOSCOPY WITH STENT PLACEMENT Left 05/24/2017   Procedure: , cystoscopy,left ureteral stent placement and left ureteroscopy attempted, retrograde pylogram;  Surgeon: Nickie Retort, MD;   Location: [facility];  Service: Urology;  Laterality: Left;  . EYE SURGERY Bilateral    Cataract Extraction with IOL  . HEMATOMA EVACUATION N/A 09/25/2019   Procedure: Evacuation Hematoma - Mediastinal Ligation of saphenous vein graft x two with Cardiopulmonary Bypass;  Surgeon: Rexene Alberts, MD;  Location: [place];  Service: Thoracic;  Laterality: N/A;  . IR THORACENTESIS ASP PLEURAL SPACE W/IMG GUIDE  09/02/2019  . RIGHT/LEFT HEART CATH AND CORONARY ANGIOGRAPHY N/A 08/21/2019   Procedure: RIGHT/LEFT HEART CATH AND CORONARY ANGIOGRAPHY;  Surgeon: Minna Merritts, MD;  Location: [place] [facility];  Service: Cardiovascular;  Laterality: N/A;  . STERNOTOMY N/A 09/25/2019   Procedure: REDO STERNOTOMY;  Surgeon: Rexene Alberts, MD;  Location: [place];  Service: Thoracic;  Laterality: N/A;  . TEE WITHOUT CARDIOVERSION N/A 08/27/2019   Procedure: TRANSESOPHAGEAL ECHOCARDIOGRAM (TEE);  Surgeon: Wonda Olds, MD;  Location: [place];  Service: Open Heart Surgery;  Laterality: N/A;  . TEE WITHOUT CARDIOVERSION N/A 09/25/2019   Procedure: Transesophageal Echocardiogram (Tee);  Surgeon: Rexene Alberts, MD;  Location: [place];  Service: Thoracic;  Laterality: N/A;  . URETEROSCOPY WITH HOLMIUM LASER LITHOTRIPSY Left 06/11/2017   Procedure: URETEROSCOPY WITH HOLMIUM LASER LITHOTRIPSY;  Surgeon: Hollice Espy, MD;  Location: [facility];  Service: Urology;  Laterality: Left;  Marland Kitchen VEIN LIGATION AND STRIPPING Bilateral 1997         Family History  Problem Relation Age of Onset  . Alcohol abuse Mother   . Asthma Mother   . Alcohol abuse Father   . Prostate cancer Father   . Liver cancer Paternal Grandmother   . Colon cancer Neg Hx   . Coronary artery disease Neg Hx   . Hypertension Neg Hx   . Diabetes Neg Hx   . Bladder Cancer Neg Hx   . Kidney cancer Neg Hx     SOCIAL HISTORY: Social History        Tobacco Use  . Smoking status: Former Smoker    Types: Cigarettes     Quit date: 11/26/1978    Years since quitting: 40.9  . Smokeless tobacco: Never Used  . Tobacco comment: social smoked till 1980's  Substance Use Topics  . Alcohol use: No    Alcohol/week: 0.0 standard drinks    Comment: none since 1984         Allergies  Allergen Reactions  . Tetracycline Hcl Other (See Comments)    Joint pain (and stiffened them, also)  . Tetracyclines & Related Other (See Comments)    Joint pain  . Beta Adrenergic Blockers     Patient experienced abdomen vasospasms with Metoprolol succinate/tartrate and carvedilol.  . Metoprolol Tartrate     Asthma and cough  . Amlodipine Swelling    Lower extremities and body became swollen; can tolerate 5 mg doses, however  . Amlodipine Other (See Comments)    10mg  dose causes lower extremity swelling. Pt can tolerate 5mg  dose.          Current Outpatient Medications  Medication Sig Dispense  Refill  . acetaminophen (TYLENOL) 325 MG tablet Take 2 tablets (650 mg total) by mouth every 6 (six) hours as needed for mild pain.    Marland Kitchen albuterol (VENTOLIN HFA) 108 (90 Base) MCG/ACT inhaler Inhale 2 puffs into the lungs 3 (three) times daily as needed for wheezing or shortness of breath.    Marland Kitchen amiodarone (PACERONE) 200 MG tablet Take 1 tablet (200 mg total) by mouth daily. 30 tablet 0  . aspirin EC 81 MG EC tablet Take 1 tablet (81 mg total) by mouth daily. 30 tablet 0  . atorvastatin (LIPITOR) 80 MG tablet Take 1 tablet (80 mg total) by mouth daily at 6 PM. 30 tablet 0  . benzonatate (TESSALON PERLES) 100 MG capsule Take 1 capsule (100 mg total) by mouth 3 (three) times daily. 30 capsule 2  . cetirizine (ZYRTEC) 10 MG tablet Take 10 mg by mouth daily.    . ferrous sulfate 325 (65 FE) MG EC tablet Take 325 mg by mouth daily with breakfast.    . Fluticasone-Salmeterol (ADVAIR) 100-50 MCG/DOSE AEPB Inhale 1 puff into the lungs 2 (two) times daily. 60 each 11  . Glucosamine 500 MG CAPS Take 500 mg by  mouth daily.    Marland Kitchen levothyroxine (SYNTHROID) 125 MCG tablet Take 1 tablet by mouth once daily (Patient taking differently: Take 125 mcg by mouth daily before breakfast. ) 90 tablet 3   No current facility-administered medications for this visit.     REVIEW OF SYSTEMS:  [X]  denotes positive finding, [ ]  denotes negative finding Cardiac  Comments:  Chest pain or chest pressure:    Shortness of breath upon exertion:    Short of breath when lying flat:    Irregular heart rhythm:        Vascular    Pain in calf, thigh, or hip brought on by ambulation:    Pain in feet at night that wakes you up from your sleep:     Blood clot in your veins:    Leg swelling:         Pulmonary    Oxygen at home:    Productive cough:     Wheezing:         Neurologic    Sudden weakness in arms or legs:     Sudden numbness in arms or legs:     Sudden onset of difficulty speaking or slurred speech:    Temporary loss of vision in one eye:     Problems with dizziness:         Gastrointestinal    Blood in stool:     Vomited blood:         Genitourinary    Burning when urinating:     Blood in urine:        Psychiatric    Major depression:         Hematologic    Bleeding problems:    Problems with blood clotting too easily:        Skin    Rashes or ulcers:        Constitutional    Fever or chills:      PHYSICAL EXAM:    Vitals:   10/13/19 0833  BP: 134/89  Pulse: 83  Resp: 20  Temp: 97.8 F (36.6 C)  SpO2: 98%  Weight: 146 lb (66.2 kg)  Height: 5\' 6"  (1.676 m)    GENERAL: The patient is a well-nourished male, in no acute distress. The vital signs are  documented above. CARDIAC: There is a regular rate and rhythm.  VASCULAR:  Right femoral pulse 2+ palpable.  Palpable small pseudoaneurysm in right groin. Right dorsalis pedis and posterior tibial 2+ palpable  DATA:   Right  femoral pseudoaneurysm partially thrombosed with residual diameter of 1.6 x 2.5 cm.  There remains essentially no neck.  Assessment/Plan:  63 year old male with partially thrombosed right femoral pseudoaneurysm that is about 29 weeks old.  Residual diameter measures 1.6 x 2.5 cm after [place] compression last week in the hospital..  Discussed again with him and his wife that I would not be comfortable with thrombin injection given essentially no neck.  We have already attempted ultrasound compression.  This point in time we have recommended open surgical repair in the operating room with a groin incision.  We will schedule this after Thanksgiving.  I think his risk for rupture of pseudoaneurysm is exceedingly low given this has been present for 7 weeks with no change and it is partially thrombosed.  The residual diameter is fairly small.  Risks and benefits of surgery discussed with patient and his wife.   Marty Heck, MD [facility] [place] Office: [phone] Pager: [phone]

## 2019-10-30 ENCOUNTER — Telehealth: Payer: Self-pay

## 2019-10-30 ENCOUNTER — Encounter (HOSPITAL_COMMUNITY): Payer: Self-pay | Admitting: Vascular Surgery

## 2019-10-30 LAB — TYPE AND SCREEN
ABO/RH(D): A POS
Antibody Screen: NEGATIVE

## 2019-10-30 LAB — BASIC METABOLIC PANEL
Anion gap: 11 (ref 5–15)
BUN: 11 mg/dL (ref 8–23)
CO2: 24 mmol/L (ref 22–32)
Calcium: 8.5 mg/dL — ABNORMAL LOW (ref 8.9–10.3)
Chloride: 102 mmol/L (ref 98–111)
Creatinine, Ser: 0.73 mg/dL (ref 0.61–1.24)
GFR calc Af Amer: >60 mL/min (ref >60)
GFR calc non Af Amer: >60 mL/min (ref >60)
Glucose, Bld: 156 mg/dL — ABNORMAL HIGH (ref 70–99)
Potassium: 4 mmol/L (ref 3.5–5.1)
Sodium: 137 mmol/L (ref 135–145)

## 2019-10-30 LAB — CBC
HCT: 34 % — ABNORMAL LOW (ref 39.0–52.0)
Hemoglobin: 11 g/dL — ABNORMAL LOW (ref 13.0–17.0)
MCH: 30.4 pg (ref 26.0–34.0)
MCHC: 32.4 g/dL (ref 30.0–36.0)
MCV: 93.9 fL (ref 80.0–100.0)
Platelets: 322 10*3/uL (ref 150–400)
RBC: 3.62 MIL/uL — ABNORMAL LOW (ref 4.22–5.81)
RDW: 15.4 % (ref 11.5–15.5)
WBC: 14.7 10*3/uL — ABNORMAL HIGH (ref 4.0–10.5)
nRBC: 0 % (ref 0.0–0.2)

## 2019-10-30 MED ORDER — TRAMADOL HCL 50 MG PO TABS: 50.0000 mg | ORAL_TABLET | Freq: Four times a day (QID) | ORAL | 0 refills | Status: DC | PRN

## 2019-10-30 NOTE — Progress Notes (Signed)
Nsg Discharge Note  Admit Date:  10/29/2019 Discharge date: 10/30/2019   Donnetta Simpers to be D/C'd home per MD order.  AVS completed. Patient/caregiver able to verbalize understanding.  Discharge Medication: Allergies as of 10/30/2019      Reactions   Tetracycline Hcl Other (See Comments)   Joint pain (and stiffened them, also)   Beta Adrenergic Blockers    Patient experienced abdomen vasospasms with Metoprolol succinate/tartrate and carvedilol.   Metoprolol Tartrate    Asthma and cough   Amlodipine Other (See Comments)   10mg  dose causes lower extremity swelling. Pt can tolerate 5mg  dose.      Medication List    TAKE these medications   acetaminophen 325 MG tablet Commonly known as: TYLENOL Take 2 tablets (650 mg total) by mouth every 6 (six) hours as needed for mild pain.   albuterol 108 (90 Base) MCG/ACT inhaler Commonly known as: VENTOLIN HFA Inhale 2 puffs into the lungs 3 (three) times daily as needed for wheezing or shortness of breath.   amiodarone 200 MG tablet Commonly known as: PACERONE Take 1 tablet (200 mg total) by mouth daily.   aspirin 81 MG EC tablet Take 1 tablet (81 mg total) by mouth daily.   atorvastatin 80 MG tablet Commonly known as: LIPITOR Take 1 tablet (80 mg total) by mouth daily at 6 PM.   benzonatate 100 MG capsule Commonly known as: Tessalon Perles Take 1 capsule (100 mg total) by mouth 3 (three) times daily.   cetirizine 10 MG tablet Commonly known as: ZYRTEC Take 10 mg by mouth daily.   COSAMIN DS PO Take 1 tablet by mouth daily.   ferrous sulfate 325 (65 FE) MG EC tablet Take 325 mg by mouth daily with breakfast.   Fluticasone-Salmeterol 100-50 MCG/DOSE Aepb Commonly known as: ADVAIR Inhale 1 puff into the lungs 2 (two) times daily.   levothyroxine 125 MCG tablet Commonly known as: SYNTHROID Take 1 tablet by mouth once daily What changed: when to take this   traMADol 50 MG tablet Commonly known as: Ultram Take 1 tablet  (50 mg total) by mouth every 6 (six) hours as needed.       Discharge Assessment: Vitals:   10/30/19 0456 10/30/19 0735  BP: 120/77 124/80  Pulse: 62 62  Resp: 15 18  Temp: 97.8 F (36.6 C) 98.2 F (36.8 C)  SpO2: 95% 95%   Skin clean, dry and intact without evidence of skin break down, no evidence of skin tears noted. IV catheter discontinued intact. Site without signs and symptoms of complications - no redness or edema noted at insertion site, patient denies c/o pain - only slight tenderness at site.  Dressing with slight pressure applied.  D/c Instructions-Education: Discharge instructions given to patient/family with verbalized understanding. D/c education completed with patient/family including follow up instructions, medication list, d/c activities limitations if indicated, with other d/c instructions as indicated by MD - patient able to verbalize understanding, all questions fully answered. Patient instructed to return to ED, call 911, or call MD for any changes in condition.  Patient escorted via [place], and D/C home via private auto.  Atilano Ina, RN 10/30/2019 12:07 PM

## 2019-10-30 NOTE — Anesthesia Postprocedure Evaluation (Signed)
Anesthesia Post Note  Patient: Jeffrey Tucker  Procedure(s) Performed: REPAIR FALSE ANEURYSM RIGHT FEMORAL (Right Leg Upper)     Patient location during evaluation: PACU Anesthesia Type: General Level of consciousness: awake and alert Pain management: pain level controlled Vital Signs Assessment: post-procedure vital signs reviewed and stable Respiratory status: spontaneous breathing, nonlabored ventilation, respiratory function stable and patient connected to nasal cannula oxygen Cardiovascular status: blood pressure returned to baseline and stable Postop Assessment: no apparent nausea or vomiting Anesthetic complications: no    Last Vitals:  Vitals:   10/30/19 0456 10/30/19 0735  BP: 120/77 124/80  Pulse: 62 62  Resp: 15 18  Temp: 36.6 C 36.8 C  SpO2: 95% 95%    Last Pain:  Vitals:   10/30/19 0735  TempSrc: Oral  PainSc:    Pain Goal: Patients Stated Pain Goal: 1 (10/29/19 0832)                 Barnet Glasgow

## 2019-10-30 NOTE — Progress Notes (Addendum)
  Progress Note    10/30/2019 7:31 AM 1 Day Post-Op  Subjective:  Says he really doesn't have any pain.  Has not walked in the hallway yet.  Afebrile HR 60'-70's NSR [id number] systolic  [id number] RA   Vitals:   10/30/19 0040 10/30/19 0456  BP: 133/79 120/77  Pulse: 73 62  Resp: 15 15  Temp: 97.9 F (36.6 C) 97.8 F (36.6 C)  SpO2: 94% 95%    Physical Exam: Cardiac:  regular Lungs:   Non labored Incisions:  Right groin incision is clean and dry Extremities:  Easily palpable right DP/PT pulses   CBC    Component Value Date/Time   WBC 14.7 (H) 10/30/2019 0414   RBC 3.62 (L) 10/30/2019 0414   HGB 11.0 (L) 10/30/2019 0414   HCT 34.0 (L) 10/30/2019 0414   PLT 322 10/30/2019 0414   MCV 93.9 10/30/2019 0414   MCH 30.4 10/30/2019 0414   MCHC 32.4 10/30/2019 0414   RDW 15.4 10/30/2019 0414   LYMPHSABS 1.1 09/24/2019 1021   MONOABS 0.8 09/24/2019 1021   EOSABS 0.1 09/24/2019 1021   BASOSABS 0.1 09/24/2019 1021    BMET    Component Value Date/Time   NA 137 10/30/2019 0414   K 4.0 10/30/2019 0414   CL 102 10/30/2019 0414   CO2 24 10/30/2019 0414   GLUCOSE 156 (H) 10/30/2019 0414   BUN 11 10/30/2019 0414   CREATININE 0.73 10/30/2019 0414   CREATININE 0.81 02/01/2017 1644   CALCIUM 8.5 (L) 10/30/2019 0414   GFRNONAA >60 10/30/2019 0414   GFRAA >60 10/30/2019 0414    INR    Component Value Date/Time   INR 1.1 10/29/2019 0851     Intake/Output Summary (Last 24 hours) at 10/30/2019 0731 Last data filed at 10/29/2019 1553 Gross per 24 hour  Intake 1252.65 ml  Output 200 ml  Net 1052.65 ml     Assessment:  63 y.o. male is s/p:  Open repair of right common femoral artery pseudoaneurysm  1 Day Post-Op  Plan: -pt with easily palpable right DP/PT pulses -groin looks good without hematoma.   -discussed groin wound care with pt  -needs to walk in hallway -discharge today-his wife can come pick him up around [place], PA-C [facility] [phone] 10/30/2019 7:31 AM   I have seen and evaluated the patient. I agree with the PA note as documented above.  Postop day 1 status post repair of right common femoral artery pseudoaneurysm.  Doing very well this morning.  Pain well controlled.  Right groin incision looks excellent.  He has palpable pedal pulses.  Plan for discharge today after he ambulates in the hall.  We will arrange wound check in 3 weeks in clinic.  Marty Heck, MD [facility] [place] Office: [phone] Pager: [phone]

## 2019-10-30 NOTE — Discharge Instructions (Signed)
 [facility]  Discharge instructions  Lower Extremity Surgery  Please refer to the following instruction for your post-procedure care. Your surgeon or physician assistant will discuss any changes with you.  Activity  You are encouraged to walk as much as you can. You can slowly return to normal activities during the month after your surgery. Avoid strenuous activity and heavy lifting until your doctor tells you it's OK. Avoid activities such as vacuuming or swinging a golf club. Do not drive until your doctor give the OK and you are no longer taking prescription pain medications. It is also normal to have difficulty with sleep habits, eating and bowel movement after surgery. These will go away with time.  Bathing/Showering  Shower daily after you go home. Do not soak in a bathtub, hot tub, or swim until the incision heals completely.  Incision Care  Clean your incision with mild soap and water. Shower every day. Pat the area dry with a clean towel. You do not need a bandage unless otherwise instructed. Do not apply any ointments or creams to your incision. If you have open wounds you will be instructed how to care for them or a visiting nurse may be arranged for you. If you have staples or sutures along your incision they will be removed at your post-op appointment. You may have skin glue on your incision. Do not peel it off. It will come off on its own in about one week.  Wash the groin wound with soap and water daily and pat dry. (No tub bath-only shower)  Then put a dry gauze or washcloth in the groin to keep this area dry to help prevent wound infection.  Do this daily and as needed.  Do not use Vaseline or neosporin on your incisions.  Only use soap and water on your incisions and then protect and keep dry.  Diet  Resume your normal diet. There are no special food restrictions following this procedure. A low fat/ low cholesterol diet is recommended for  all patients with vascular disease. In order to heal from your surgery, it is CRITICAL to get adequate nutrition. Your body requires vitamins, minerals, and protein. Vegetables are the best source of vitamins and minerals. Vegetables also provide the perfect balance of protein. Processed food has little nutritional value, so try to avoid this.  Medications  Resume taking all your medications unless your doctor or physician assistant tells you not to. If your incision is causing pain, you may take over-the-counter pain relievers such as acetaminophen (Tylenol). If you were prescribed a stronger pain medication, please aware these medication can cause nausea and constipation. Prevent nausea by taking the medication with a snack or meal. Avoid constipation by drinking plenty of fluids and eating foods with high amount of fiber, such as fruits, vegetables, and grains. Take Colace 100 mg (an over-the-counter stool softener) twice a day as needed for constipation.  Do not take Tylenol if you are taking prescription pain medications.  Follow Up  Our office will schedule a follow up appointment 2-3 weeks following discharge.  Please call us immediately for any of the following conditions  Severe or worsening pain in your legs or feet while at rest or while walking Increase pain, redness, warmth, or drainage (pus) from your incision site(s) Fever of 101 degree or higher The swelling in your leg with the bypass suddenly worsens and becomes more painful than when you were in the hospital If you have been   instructed to feel your graft pulse then you should do so every day. If you can no longer feel this pulse, call the office immediately. Not all patients are given this instruction.  Leg swelling is common after leg bypass surgery.  The swelling should improve over a few months following surgery. To improve the swelling, you may elevate your legs above the level of your heart while you are sitting or  resting. Your surgeon or physician assistant may ask you to apply an ACE wrap or wear compression (TED) stockings to help to reduce swelling.  Reduce your risk of vascular disease  Stop smoking. If you would like help call [facility] at 1-800-QUIT-NOW (1-800-784-8669) or [place] at 336-586-4000.  Manage your cholesterol Maintain a desired weight Control your diabetes weight Control your diabetes Keep your blood pressure down  If you have any questions, please call the office at [phone] 

## 2019-10-30 NOTE — Discharge Summary (Signed)
Discharge Summary     TC ALFARO [birth year] 63 y.o. male  [id number]  Admission Date: 10/29/2019  Discharge Date: 10/30/2019  Physician: Marty Heck, MD  Admission Diagnosis: right femoral pseudoaneurysm  HPI:   This is a 63 y.o. male with complex medical course followingaortic root replacement and CABGx2 on 08/27/19 thatpresents for hospital follow-up for right femoral pseudoaneurysm. Patient initially had heart cath via right femoral access at [facility] about 7 weeks ago. Ultimately vascular surgery wascalledafter his heart surgery hereto evaluate a small right femoral pseudoaneurysm during his hospitalization following his CABG. Ultimately his pseudoaneurysm had no identifiable neck and we were not comfortable with thrombin injection. He did undergo ultrasound compression for at least 30 minutes with partial thrombosis. Given that this pseudoaneurysm hadbeen present for 6 weeks we elected to let him go home with short interval follow-up.  No new issues today. No groin pain. Leg feels good.  Hospital Course:  The patient was admitted to the hospital and taken to the operating room on 10/29/2019 and underwent: Open repair of right common femoral artery pseudoaneurysm    Findings: There was significant scar in the right groin from the mynx closure device.  Ultimately the pseudoaneurysm cavity was encountered and it was very indurated.  The artery itself where the pseudoaneurysm was located was fairly fragile.  Attempted to put a figure-of-eight 5-0 Prolene in the artery and ultimately this caused the artery to tear slightly.  Ultimately placed two pledgeted sutures with hemostasis and repair of pseudoaneurysm.  He had palpable pedal pulses distally after repair.  The pt tolerated the procedure well and was transported to the PACU in good condition.   By POD 1, he was doing well with easily palpable right DP/PT pulses.  His incision looked good.    The remainder of the hospital course consisted of increasing mobilization and increasing intake of solids without difficulty.  CBC    Component Value Date/Time   WBC 14.7 (H) 10/30/2019 0414   RBC 3.62 (L) 10/30/2019 0414   HGB 11.0 (L) 10/30/2019 0414   HCT 34.0 (L) 10/30/2019 0414   PLT 322 10/30/2019 0414   MCV 93.9 10/30/2019 0414   MCH 30.4 10/30/2019 0414   MCHC 32.4 10/30/2019 0414   RDW 15.4 10/30/2019 0414   LYMPHSABS 1.1 09/24/2019 1021   MONOABS 0.8 09/24/2019 1021   EOSABS 0.1 09/24/2019 1021   BASOSABS 0.1 09/24/2019 1021    BMET    Component Value Date/Time   NA 137 10/30/2019 0414   K 4.0 10/30/2019 0414   CL 102 10/30/2019 0414   CO2 24 10/30/2019 0414   GLUCOSE 156 (H) 10/30/2019 0414   BUN 11 10/30/2019 0414   CREATININE 0.73 10/30/2019 0414   CREATININE 0.81 02/01/2017 1644   CALCIUM 8.5 (L) 10/30/2019 0414   GFRNONAA >60 10/30/2019 0414   GFRAA >60 10/30/2019 0414     Discharge Instructions    Discharge patient   Complete by: As directed    Discharge disposition: 01-Home or Self Care   Discharge patient date: 10/30/2019      Discharge Diagnosis:  right femoral pseudoaneurysm  Secondary Diagnosis: Patient Active Problem List   Diagnosis Date Noted  . Femoral artery pseudo-aneurysm, right ([place]) 10/29/2019  . Pseudoaneurysm ([place]) 10/13/2019  . Acute blood loss anemia   . Pericardial tamponade 09/25/2019  . Pleural effusion 09/25/2019  . Cardiogenic shock ([place]) 09/25/2019  . Acute on chronic diastolic heart failure ([place]) 09/25/2019  . Rapid atrial fibrillation (  [place])   . Coronary artery disease of bypass graft of native heart with stable angina pectoris ([place])   . Anemia   . 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 S/P aortic valve replacement with bioprosthetic valve 08/27/2019  . Seborrheic dermatitis of scalp 06/17/2019  . Arthralgia 04/01/2019  . Preventative health care 12/26/2017  . Allergic asthma 09/09/2017  . Essential  hypertension, benign 02/28/2017  . Advance care planning 09/30/2016  . Mild intermittent asthma 08/21/2016  . Hypertriglyceridemia   . Benign prostatic hyperplasia   . Ascending aorta dilatation (H[facility]) 08/21/2013  . Aortic valve regurgitation 07/20/2011  . Bicuspid aortic valve 07/02/2011  . Mild intermittent asthma without complication 0000000  . ALLERGIC RHINITIS 04/27/2009  . Hypothyroidism 04/14/2008   Past Medical History:  Diagnosis Date  . Allergy    allergic rhinitis  . Aortic root dilatation ([place])    a. s/p surgical repair 08/27/2019  . Asthma    mostly in childhood  . Bicuspid aortic valve    a.  Status post bioprosthetic replacement 08/27/2019  . BPH (benign prostatic hypertrophy)   . CAD (coronary artery disease)    a. s/p 2-v CABG 08/2019 (SVG-OM, SVG--rPDA) *Grafts subsequently ligated due to bleeding  . GERD (gastroesophageal reflux disease)   . History of kidney stones   . Hypertension   . Hypertriglyceridemia   . Hypothyroidism   . Pericardial tamponade 09/25/2019  . Severe aortic regurgitation    a. bicuspid aortic valve, b. s/p bio Bentall procedure 08/2019  . Wears glasses      Allergies as of 10/30/2019      Reactions   Tetracycline Hcl Other (See Comments)   Joint pain (and stiffened them, also)   Beta Adrenergic Blockers    Patient experienced abdomen vasospasms with Metoprolol succinate/tartrate and carvedilol.   Metoprolol Tartrate    Asthma and cough   Amlodipine Other (See Comments)   10mg  dose causes lower extremity swelling. Pt can tolerate 5mg  dose.      Medication List    TAKE these medications   acetaminophen 325 MG tablet Commonly known as: TYLENOL Take 2 tablets (650 mg total) by mouth every 6 (six) hours as needed for mild pain.   albuterol 108 (90 Base) MCG/ACT inhaler Commonly known as: VENTOLIN HFA Inhale 2 puffs into the lungs 3 (three) times daily as needed for wheezing or shortness of breath.   amiodarone 200 MG  tablet Commonly known as: PACERONE Take 1 tablet (200 mg total) by mouth daily.   aspirin 81 MG EC tablet Take 1 tablet (81 mg total) by mouth daily.   atorvastatin 80 MG tablet Commonly known as: LIPITOR Take 1 tablet (80 mg total) by mouth daily at 6 PM.   benzonatate 100 MG capsule Commonly known as: Tessalon Perles Take 1 capsule (100 mg total) by mouth 3 (three) times daily.   cetirizine 10 MG tablet Commonly known as: ZYRTEC Take 10 mg by mouth daily.   COSAMIN DS PO Take 1 tablet by mouth daily.   ferrous sulfate 325 (65 FE) MG EC tablet Take 325 mg by mouth daily with breakfast.   Fluticasone-Salmeterol 100-50 MCG/DOSE Aepb Commonly known as: ADVAIR Inhale 1 puff into the lungs 2 (two) times daily.   levothyroxine 125 MCG tablet Commonly known as: SYNTHROID Take 1 tablet by mouth once daily What changed: when to take this   traMADol 50 MG tablet Commonly known as: Ultram Take 1 tablet (50 mg total) by mouth every 6 (six) hours  as needed.       Discharge Instructions: [facility] Discharge instructions Lower Extremity Bypass Surgery  Please refer to the following instruction for your post-procedure care. Your surgeon or physician assistant will discuss any changes with you.  Activity  You are encouraged to walk as much as you can. You can slowly return to normal activities during the month after your surgery. Avoid strenuous activity and heavy lifting until your doctor tells you it's OK. Avoid activities such as vacuuming or swinging a golf club. Do not drive until your doctor give the OK and you are no longer taking prescription pain medications. It is also normal to have difficulty with sleep habits, eating and bowel movement after surgery. These will go away with time.  Bathing/Showering  You may shower after you go home. Do not soak in a bathtub, hot tub, or swim until the incision heals completely.  Incision Care   Clean your incision with mild soap and water. Shower every day. Pat the area dry with a clean towel. You do not need a bandage unless otherwise instructed. Do not apply any ointments or creams to your incision. If you have open wounds you will be instructed how to care for them or a visiting nurse may be arranged for you. If you have staples or sutures along your incision they will be removed at your post-op appointment. You may have skin glue on your incision. Do not peel it off. It will come off on its own in about one week.  Wash the groin wound with soap and water daily and pat dry. (No tub bath-only shower)  Then put a dry gauze or washcloth in the groin to keep this area dry to help prevent wound infection.  Do this daily and as needed.  Do not use Vaseline or neosporin on your incisions.  Only use soap and water on your incisions and then protect and keep dry.  Diet  Resume your normal diet. There are no special food restrictions following this procedure. A low fat/ low cholesterol diet is recommended for all patients with vascular disease. In order to heal from your surgery, it is CRITICAL to get adequate nutrition. Your body requires vitamins, minerals, and protein. Vegetables are the best source of vitamins and minerals. Vegetables also provide the perfect balance of protein. Processed food has little nutritional value, so try to avoid this.  Medications  Resume taking all your medications unless your doctor or Physician Assistant tells you not to. If your incision is causing pain, you may take over-the-counter pain relievers such as acetaminophen (Tylenol). If you were prescribed a stronger pain medication, please aware these medication can cause nausea and constipation. Prevent nausea by taking the medication with a snack or meal. Avoid constipation by drinking plenty of fluids and eating foods with high amount of fiber, such as fruits, vegetables, and grains. Take Colace 100 mg (an  over-the-counter stool softener) twice a day as needed for constipation.  Do not take Tylenol if you are taking prescription pain medications.  Follow Up  Our office will schedule a follow up appointment 2-3 weeks following discharge.  Please call us immediately for any of the following conditions  .Severe or worsening pain in your legs or feet while at rest or while walking .Increase pain, redness, warmth, or drainage (pus) from your incision site(s) . Fever of 101 degree or higher . The swelling in your leg with the bypass suddenly worsens and becomes more  painful than when you were in the hospital . If you have been instructed to feel your graft pulse then you should do so every day. If you can no longer feel this pulse, call the office immediately. Not all patients are given this instruction. .  Leg swelling is common after leg bypass surgery.  The swelling should improve over a few months following surgery. To improve the swelling, you may elevate your legs above the level of your heart while you are sitting or resting. Your surgeon or physician assistant may ask you to apply an ACE wrap or wear compression (TED) stockings to help to reduce swelling.  Reduce your risk of vascular disease  Stop smoking. If you would like help call [facility] at 1-800-QUIT-NOW 531-190-2676) or [place] at 734-872-9373.  . Manage your cholesterol . Maintain a desired weight . Control your diabetes weight . Control your diabetes . Keep your blood pressure down .  If you have any questions, please call the office at [phone]   Prescriptions given: 1.  Tramadol#20 No Refill   Disposition: home  Patient's condition: is Good  Follow up: 1. Dr. Carlis Abbott in 3 weeks   Leontine Locket, PA-C [facility] [phone] 10/30/2019  7:40 AM  - For VQI Registry use ---   Post-op:  Wound infection: No  Graft infection: No  Transfusion: No    If yes, n/a units given New  Arrhythmia: No Ipsilateral amputation: No, [ ]  Minor, [ ]  BKA, [ ]  AKA Discharge patency: [x ] Primary, [ ]  Primary assisted, [ ]  Secondary, [ ]  Occluded Patency judged by: [ ]  Dopper only, [ ]  Palpable graft pulse, [x]  Palpable distal pulse, [ ]  ABI inc. > 0.15, [ ]  Duplex Discharge ABI: R not done, L  D/C Ambulatory Status: Ambulatory  Complications: MI: No, [ ]  Troponin only, [ ]  EKG or Clinical CHF: No Resp failure:No, [ ]  Pneumonia, [ ]  Ventilator Chg in renal function: No, [ ]  Inc. Cr > 0.5, [ ]  Temp. Dialysis,  [ ]  Permanent dialysis Stroke: No, [ ]  Minor, [ ]  Major Return to OR: No  Reason for return to OR: [ ]  Bleeding, [ ]  Infection, [ ]  Thrombosis, [ ]  Revision  Discharge medications: Statin use:  yes ASA use:  yes Plavix use:  no Beta blocker use: no CCB use:  No ACEI use:   no ARB use:  no Coumadin use: no

## 2019-10-30 NOTE — Telephone Encounter (Signed)
Transition Care Management Follow-up Telephone Call  Date of discharge and from where[facility]  How have you been since you were released from the hospital?  Patient states that he is sore in his groin area but overall is doing great.  Any questions or concerns? No   Items Reviewed:  Did the pt receive and understand the discharge instructions provided? Yes   Medications obtained and verified? Yes   Any new allergies since your discharge? No   Dietary orders reviewed? Yes  Do you have support at home? Yes   Functional Questionnaire: (I = Independent and D = Dependent) ADLs: I  Bathing/Dressing- I  Meal Prep- I  Eating- I  Maintaining continence- I  Transferring/Ambulation- I  Managing Meds- I  Follow up appointments reviewed:   PCP Hospital f/u appt confirmed? Per Dr. Silvio Pate, patient does not need follow up at this time  [facility] f/u appt confirmed? Yes  Patient stated that the office is going to call him with the appointment information in the coming days.   Are transportation arrangements needed? No   If their condition worsens, is the pt aware to call PCP or go to the Emergency Dept.? Yes  Was the patient provided with contact information for the PCP's office or ED? Yes  Was to pt encouraged to call back with questions or concerns? Yes

## 2019-11-02 ENCOUNTER — Other Ambulatory Visit: Payer: Self-pay

## 2019-11-02 MED ORDER — ATORVASTATIN CALCIUM 80 MG PO TABS: 80.0000 mg | ORAL_TABLET | Freq: Every day | ORAL | 11 refills | Status: DC

## 2019-11-02 NOTE — Telephone Encounter (Signed)
*  STAT* If patient is at the pharmacy, call can be transferred to refill team.   1. Which medications need to be refilled? (please list name of each medication and dose if known) Amiodarone  2. Which pharmacy/location (including street and city if local pharmacy) is medication to be sent to? [facility]  3. Do they need a 30 day or 90 day supply? [place]

## 2019-11-02 NOTE — Telephone Encounter (Signed)
Ok to refill under Dr. Gollan. Thank you! 

## 2019-11-02 NOTE — Telephone Encounter (Signed)
We can refill. Thanks

## 2019-11-03 MED ORDER — AMIODARONE HCL 200 MG PO TABS: 200.0000 mg | ORAL_TABLET | Freq: Every day | ORAL | 0 refills | Status: DC

## 2019-11-11 ENCOUNTER — Other Ambulatory Visit: Payer: Self-pay | Admitting: Cardiovascular Disease

## 2019-12-03 ENCOUNTER — Ambulatory Visit (INDEPENDENT_AMBULATORY_CARE_PROVIDER_SITE_OTHER): Payer: Self-pay | Admitting: Internal Medicine

## 2019-12-03 ENCOUNTER — Encounter: Payer: Self-pay | Admitting: Internal Medicine

## 2019-12-03 ENCOUNTER — Other Ambulatory Visit: Payer: Self-pay

## 2019-12-03 VITALS — BP 132/80 | HR 61 | Ht 66.0 in | Wt 152.0 lb

## 2019-12-03 DIAGNOSIS — Z953 Presence of xenogenic heart valve: Secondary | ICD-10-CM

## 2019-12-03 DIAGNOSIS — I712 Thoracic aortic aneurysm, without rupture, unspecified: Secondary | ICD-10-CM

## 2019-12-03 DIAGNOSIS — I25118 Atherosclerotic heart disease of native coronary artery with other forms of angina pectoris: Secondary | ICD-10-CM

## 2019-12-03 DIAGNOSIS — L97211 Non-pressure chronic ulcer of right calf limited to breakdown of skin: Secondary | ICD-10-CM

## 2019-12-03 NOTE — Progress Notes (Signed)
Follow-up Outpatient Visit Date: 12/03/2019  Primary Care Provider: Venia Carbon, MD [place] [address]   Primary Cardiologist: Esmond Plants, MD PhD  Chief Complaint: Discuss coronary artery disease  HPI:  Mr. Rutkowski is a 64 y.o. male with history of coronary artery disease involving the LCx and RCA, bicuspid aortic valve and a sending aortic aneurysm status post Bentall procedure, pulmonary hypertension, systemic hypertension, hypertriglyceridemia, hypothyroidism, and tobacco use, who presents for discussion of interventional options for management of coronary artery disease.  He underwent Bentall procedure with bioprosthetic aortic valve as well as two-vessel CABG on 08/27/2019.  He was readmitted on 09/16/2019 with severe chest pain and tachycardia.  He was noted to have a slight increase in pericardial effusion and was treated for post pericardiotomy syndrome.  He also had a brief episode of atrial fibrillation during that admission and was treated with amiodarone.  On the morning of 09/25/2019, he was found unresponsive by his wife.  He had noted a small amount of bloody drainage from his sternal wound the day before.  He was successfully resuscitated and brought to [facility], where he underwent emergent redo sternotomy.  He was found to have massive hemopericardium and required ligation of both saphenous vein grafts.  Postoperative course was also complicated by right common femoral artery pseudoaneurysm thought to be due to diagnostic catheterization prior to cardiac surgery requiring surgical repair.  Today, Mr. Dibella reports that he is feeling "better than ever."  He has almost completely recovered from his cardiac surgeries in October.  He notes that he did not have any symptoms before surgery but is concerned about the possibility of having a heart attack, given that both bypass grafts had to be ligated during his second surgery.  Mr. Mcgovern denies chest pain,  shortness of breath, palpitations, lightheadedness, and edema.  He typically walks an hour per day.  Mr. Larmon only complaint is of a non-healing wound with slight drainage at the right SVG harvest site.  It is not painful.  He denies fevers and chills.  His wife has also mentioned that she is able to hear his heart beat several inches from his chest.  Mr. Herridge is also intermittently aware of his pulse in the back of his head.  He is due for follow-up with Dr. Roxy Manns (cardiac surgery) next week.  --------------------------------------------------------------------------------------------------  Past Medical History:  Diagnosis Date  . Allergy    allergic rhinitis  . Aortic root dilatation ([place])    a. s/p surgical repair 08/27/2019  . Asthma    mostly in childhood  . Bicuspid aortic valve    a.  Status post bioprosthetic replacement 08/27/2019  . BPH (benign prostatic hypertrophy)   . CAD (coronary artery disease)    a. s/p 2-v CABG 08/2019 (SVG-OM, SVG--rPDA) *Grafts subsequently ligated due to bleeding  . GERD (gastroesophageal reflux disease)   . History of kidney stones   . Hypertension   . Hypertriglyceridemia   . Hypothyroidism   . Pericardial tamponade 09/25/2019  . Severe aortic regurgitation    a. bicuspid aortic valve, b. s/p bio Bentall procedure 08/2019  . Wears glasses    Past Surgical History:  Procedure Laterality Date  . BENTALL PROCEDURE N/A 08/27/2019   Procedure: BENTALL PROCEDURE with a 27 mm KONECT Resilia Aortic Valved Conduit and 30 mm x 30 cm Hemashield Platinum straight graft.;  Surgeon: Wonda Olds, MD;  Location: [facility];  Service: Open Heart Surgery;  Laterality:  N/A;  . CARDIAC CATHETERIZATION  2006   [place]  . CARDIOVASCULAR STRESS TEST  2006   [place]  . CHOLECYSTECTOMY  2006  . CORONARY ARTERY BYPASS GRAFT N/A 08/27/2019   Procedure: CORONARY ARTERY BYPASS GRAFTING (CABG) times 2 using right saphenous endoscopic vein harvesting to OM2 nad PDA.;   Surgeon: Wonda Olds, MD;  Location: [place];  Service: Open Heart Surgery;  Laterality: N/A;  . CYSTOSCOPY W/ URETERAL STENT PLACEMENT  05/24/2017   Procedure: left;  Surgeon: Nickie Retort, MD;  Location: [facility];  Service: Urology;;  . Consuela Mimes W/ URETERAL STENT PLACEMENT Left 06/11/2017   Procedure: CYSTOSCOPY WITH STENT REPLACEMENT;  Surgeon: Hollice Espy, MD;  Location: [facility];  Service: Urology;  Laterality: Left;  . CYSTOSCOPY WITH BIOPSY Left 06/11/2017   Procedure: RENAL PELVIS TUMOR WITH BIOPSY WITH POSSIBLE LASER ABLATION;  Surgeon: Hollice Espy, MD;  Location: [facility];  Service: Urology;  Laterality: Left;  . CYSTOSCOPY WITH STENT PLACEMENT Left 05/24/2017   Procedure: , cystoscopy,left ureteral stent placement and left ureteroscopy attempted, retrograde pylogram;  Surgeon: Nickie Retort, MD;  Location: [facility];  Service: Urology;  Laterality: Left;  . EYE SURGERY Bilateral    Cataract Extraction with IOL  . FALSE ANEURYSM REPAIR  10/29/2019   REPAIR FALSE ANEURYSM RIGHT FEMORAL   . FALSE ANEURYSM REPAIR Right 10/29/2019   Procedure: REPAIR FALSE ANEURYSM RIGHT FEMORAL;  Surgeon: Marty Heck, MD;  Location: [place];  Service: Vascular;  Laterality: Right;  . HEMATOMA EVACUATION N/A 09/25/2019   Procedure: Evacuation Hematoma - Mediastinal Ligation of saphenous vein graft x two with Cardiopulmonary Bypass;  Surgeon: Rexene Alberts, MD;  Location: [place];  Service: Thoracic;  Laterality: N/A;  . IR THORACENTESIS ASP PLEURAL SPACE W/IMG GUIDE  09/02/2019  . RIGHT/LEFT HEART CATH AND CORONARY ANGIOGRAPHY N/A 08/21/2019   Procedure: RIGHT/LEFT HEART CATH AND CORONARY ANGIOGRAPHY;  Surgeon: Minna Merritts, MD;  Location: [place] [facility];  Service: Cardiovascular;  Laterality: N/A;  . STERNOTOMY N/A 09/25/2019   Procedure: REDO STERNOTOMY;  Surgeon: Rexene Alberts, MD;  Location: [place];  Service: Thoracic;  Laterality: N/A;  . TEE WITHOUT  CARDIOVERSION N/A 08/27/2019   Procedure: TRANSESOPHAGEAL ECHOCARDIOGRAM (TEE);  Surgeon: Wonda Olds, MD;  Location: [place];  Service: Open Heart Surgery;  Laterality: N/A;  . TEE WITHOUT CARDIOVERSION N/A 09/25/2019   Procedure: Transesophageal Echocardiogram (Tee);  Surgeon: Rexene Alberts, MD;  Location: [place];  Service: Thoracic;  Laterality: N/A;  . URETEROSCOPY WITH HOLMIUM LASER LITHOTRIPSY Left 06/11/2017   Procedure: URETEROSCOPY WITH HOLMIUM LASER LITHOTRIPSY;  Surgeon: Hollice Espy, MD;  Location: [facility];  Service: Urology;  Laterality: Left;  Marland Kitchen VEIN LIGATION AND STRIPPING Bilateral 1997    Current Meds  Medication Sig  . acetaminophen (TYLENOL) 325 MG tablet Take 2 tablets (650 mg total) by mouth every 6 (six) hours as needed for mild pain.  Marland Kitchen albuterol (VENTOLIN HFA) 108 (90 Base) MCG/ACT inhaler Inhale 2 puffs into the lungs 3 (three) times daily as needed for wheezing or shortness of breath.  Marland Kitchen amiodarone (PACERONE) 200 MG tablet Take 1 tablet (200 mg total) by mouth daily.  Marland Kitchen aspirin EC 81 MG EC tablet Take 1 tablet (81 mg total) by mouth daily.  Marland Kitchen atorvastatin (LIPITOR) 80 MG tablet Take 1 tablet (80 mg total) by mouth daily at 6 PM.  . cetirizine (ZYRTEC) 10 MG tablet Take 10 mg by mouth daily.  Marland Kitchen  ferrous sulfate 325 (65 FE) MG EC tablet Take 325 mg by mouth daily with breakfast.  . Fluticasone-Salmeterol (ADVAIR) 100-50 MCG/DOSE AEPB Inhale 1 puff into the lungs 2 (two) times daily.  . Glucosamine-Chondroitin (COSAMIN DS PO) Take 1 tablet by mouth daily.  Marland Kitchen levothyroxine (SYNTHROID) 125 MCG tablet Take 1 tablet by mouth once daily (Patient taking differently: Take 125 mcg by mouth daily before breakfast. )  . traMADol (ULTRAM) 50 MG tablet Take 1 tablet (50 mg total) by mouth every 6 (six) hours as needed.    Allergies: Tetracycline hcl, Beta adrenergic blockers, Metoprolol tartrate, and Amlodipine  Social History   Tobacco Use  . Smoking status: Former  Smoker    Types: Cigarettes    Quit date: 11/26/1978    Years since quitting: 41.0  . Smokeless tobacco: Never Used  . Tobacco comment: social smoked till 1980's  Substance Use Topics  . Alcohol use: No    Alcohol/week: 0.0 standard drinks    Comment: none since 1984  . Drug use: No    Family History  Problem Relation Age of Onset  . Alcohol abuse Mother   . Asthma Mother   . Alcohol abuse Father   . Prostate cancer Father   . Liver cancer Paternal Grandmother   . Colon cancer Neg Hx   . Coronary artery disease Neg Hx   . Hypertension Neg Hx   . Diabetes Neg Hx   . Bladder Cancer Neg Hx   . Kidney cancer Neg Hx     Review of Systems: A 12-system review of systems was performed and was negative except as noted in the HPI.  --------------------------------------------------------------------------------------------------  Physical Exam: BP 132/80 (BP Location: Left Arm, Patient Position: Sitting, Cuff Size: Normal)   Pulse 61   Ht 5\' 6"  (1.676 m)   Wt 152 lb (68.9 kg)   SpO2 98%   BMI 24.53 kg/m   General:  NAD. HEENT: No conjunctival pallor or scleral icterus. Facemask in place. Neck: Supple without lymphadenopathy, thyromegaly, JVD, or HJR. Lungs: Normal work of breathing. Clear to auscultation bilaterally without wheezes or crackles. Heart: Regular rate and rhythm without murmurs, rubs, or gallops. S2 is quite pronounced but crisp.  Median sternotomy is well-healed. Abd: Bowel sounds present. Soft, NT/ND without hepatosplenomegaly Ext: No lower extremity edema. Radial, PT, and DP pulses are 2+ bilaterally. Skin: Warm and dry without rash.  Right calf endoscopic SVG harvest incision shows superficial ulceration with minimal serous drainage.  There is some induration without fluctuance.  No tenderness or surrounding erythema.  EKG:  Normal sinus rhythm with possible left atrial enlargement and LVH with abnormal repolarization.  No significant change since  10/06/2019.  Lab Results  Component Value Date   WBC 14.7 (H) 10/30/2019   HGB 11.0 (L) 10/30/2019   HCT 34.0 (L) 10/30/2019   MCV 93.9 10/30/2019   PLT 322 10/30/2019    Lab Results  Component Value Date   NA 137 10/30/2019   K 4.0 10/30/2019   CL 102 10/30/2019   CO2 24 10/30/2019   BUN 11 10/30/2019   CREATININE 0.73 10/30/2019   GLUCOSE 156 (H) 10/30/2019   ALT 16 10/29/2019    Lab Results  Component Value Date   CHOL 184 04/02/2019   HDL 29.60 (L) 04/02/2019   LDLCALC 103 (H) 05/26/2012   LDLDIRECT 107.0 04/02/2019   TRIG 207.0 (H) 04/02/2019   CHOLHDL 6 04/02/2019    --------------------------------------------------------------------------------------------------  ASSESSMENT AND PLAN: Coronary artery disease: Mr.  Vanauken was incidentally noted to have two vessel coronary artery disease involving the mid LCx and distal RCA/rPDA.  Two vessel CABG was performed at time of AVR and ascending aortic aneurysm repair, though both bypass grafts were ligated in the setting of emergent reexploration of the chest due to massive hemopericardium secondary to hemorrhage from one of the bypass grafts.  Mr. Bredehoft LCx and RCA/rPDA lesions are now unrevascularized.  However, he denies ever having angina (both before cardiac surgery and now).  We reviewed his preoperative coronary angiogram in the office today as well as indications for percutaneous revascularization (acute coronary syndrome, high-risk findings on stress testing or angiography, and angina refractory to aggressive medical therapy).  At this time, Mr. Odonald does not meet any of these indications.  He is concerned about the potential for having a heart attack in the future.  We discussed the importance of secondary prevention with statin therapy, antiplatelet therapy, blood pressure control, and lifestyle modifications.  We have agreed to pursue a pharmacologic myocardial perfusion stress test to exclude high risk ischemia.  For  now, I recommend continued medical therapy of his asymptomatic two-vessel coronary artery disease.  Should PCI be pursued in the future, I think it would be reasonable to treat the LCx and distal RCA.  I suspect that the rPDA may be too small for stent placement.  Thoracic aortic aneurysm status post repair and bioprosthetic aortic valve replacement: No symptoms.  Continue follow-up with Dr. Rockey Situ and cardiac surgery.  Right calf ulcer: Right SVG harvest site remains incompletely healed with slight serous drainage.  He does not have evidence of surrounding cellulitis.  As the ulcer has been present for a couple of months and Mr. Lashway is scheduled for follow-up with Dr. Roxy Manns early next week, I will defer further evaluation/management to Dr. Roxy Manns.  Follow-up: Return to clinic in 1 month.  60 minutes were spent on this encounter, of which more then 50% was spent face-to-face with the patient reviewing his prior studies and complex history.  Nelva Bush, MD 12/05/2019 11:23 AM

## 2019-12-03 NOTE — Patient Instructions (Signed)
Medication Instructions:  Your physician recommends that you continue on your current medications as directed. Please refer to the Current Medication list given to you today.  *If you need a refill on your cardiac medications before your next appointment, please call your pharmacy*  Lab Work: none If you have labs (blood work) drawn today and your tests are completely normal, you will receive your results only by: Marland Kitchen MyChart Message (if you have MyChart) OR . A paper copy in the mail If you have any lab test that is abnormal or we need to change your treatment, we will call you to review the results.  Testing/Procedures: Your physician has requested that you have a lexiscan myoview. For further information please visit [email or web address]. Please follow instruction sheet, as given.  [place]  Your caregiver has ordered a Stress Test with nuclear imaging. The purpose of this test is to evaluate the blood supply to your heart muscle. This procedure is referred to as a "Non-Invasive Stress Test." This is because other than having an IV started in your vein, nothing is inserted or "invades" your body. Cardiac stress tests are done to find areas of poor blood flow to the heart by determining the extent of coronary artery disease (CAD). Some patients exercise on a treadmill, which naturally increases the blood flow to your heart, while others who are  unable to walk on a treadmill due to physical limitations have a pharmacologic/chemical stress agent called Lexiscan . This medicine will mimic walking on a treadmill by temporarily increasing your coronary blood flow.   Please note: these test may take anywhere between 2-4 hours to complete  PLEASE REPORT TO [facility] AT THE FIRST DESK WILL DIRECT YOU WHERE TO GO  Date of Procedure:_____________________________________  Arrival Time for Procedure:______________________________   PLEASE NOTIFY THE OFFICE AT  LEAST 24 HOURS IN ADVANCE IF YOU ARE UNABLE TO KEEP YOUR APPOINTMENT.  [phone] AND  PLEASE NOTIFY NUCLEAR MEDICINE AT [facility] AT LEAST 24 HOURS IN ADVANCE IF YOU ARE UNABLE TO KEEP YOUR APPOINTMENT. [phone]  How to prepare for your Myoview test:  1. Do not eat or drink after midnight 2. No caffeine for 24 hours prior to test 3. No smoking 24 hours prior to test. 4. Your medication may be taken with water.  If your doctor stopped a medication because of this test, do not take that medication. 5. Ladies, please do not wear dresses.  Skirts or pants are appropriate. Please wear a short sleeve shirt. 6. No perfume, cologne or lotion. 7. Wear comfortable walking shoes.  Follow-Up: At [facility], you and your health needs are our priority.  As part of our continuing mission to provide you with exceptional heart care, we have created designated Provider Care Teams.  These Care Teams include your primary Cardiologist (physician) and Advanced Practice Providers (APPs -  Physician Assistants and Nurse Practitioners) who all work together to provide you with the care you need, when you need it.  Your next appointment:   1 month(s) with Dr End.  The format for your next appointment:   In Person  Provider:    DR Harrell Gave END   Cardiac Nuclear Scan A cardiac nuclear scan is a test that measures blood flow to the heart when a person is resting and when he or she is exercising. The test looks for problems such as:  Not enough blood reaching a portion of the heart.  The heart muscle not working  normally. You may need this test if:  You have heart disease.  You have had abnormal lab results.  You have had heart surgery or a balloon procedure to open up blocked arteries (angioplasty).  You have chest pain.  You have shortness of breath. In this test, a radioactive dye (tracer) is injected into your bloodstream. After the tracer has traveled to your heart, an imaging device is  used to measure how much of the tracer is absorbed by or distributed to various areas of your heart. This procedure is usually done at a hospital and takes 2-4 hours. Tell a health care provider about:  Any allergies you have.  All medicines you are taking, including vitamins, herbs, eye drops, creams, and over-the-counter medicines.  Any problems you or family members have had with anesthetic medicines.  Any blood disorders you have.  Any surgeries you have had.  Any medical conditions you have.  Whether you are pregnant or may be pregnant. What are the risks? Generally, this is a safe procedure. However, problems may occur, including:  Serious chest pain and heart attack. This is only a risk if the stress portion of the test is done.  Rapid heartbeat.  Sensation of warmth in your chest. This usually passes quickly.  Allergic reaction to the tracer. What happens before the procedure?  Ask your health care provider about changing or stopping your regular medicines. This is especially important if you are taking diabetes medicines or blood thinners.  Follow instructions from your health care provider about eating or drinking restrictions.  Remove your jewelry on the day of the procedure. What happens during the procedure?  An IV will be inserted into one of your veins.  Your health care provider will inject a small amount of radioactive tracer through the IV.  You will wait for 20-40 minutes while the tracer travels through your bloodstream.  Your heart activity will be monitored with an electrocardiogram (ECG).  You will lie down on an exam table.  Images of your heart will be taken for about 15-20 minutes.  You may also have a stress test. For this test, one of the following may be done: ? You will exercise on a treadmill or stationary bike. While you exercise, your heart's activity will be monitored with an ECG, and your blood pressure will be checked. ? You will be  given medicines that will increase blood flow to parts of your heart. This is done if you are unable to exercise.  When blood flow to your heart has peaked, a tracer will again be injected through the IV.  After 20-40 minutes, you will get back on the exam table and have more images taken of your heart.  Depending on the type of tracer used, scans may need to be repeated 3-4 hours later.  Your IV line will be removed when the procedure is over. The procedure may vary among health care providers and hospitals. What happens after the procedure?  Unless your health care provider tells you otherwise, you may return to your normal schedule, including diet, activities, and medicines.  Unless your health care provider tells you otherwise, you may increase your fluid intake. This will help to flush the contrast dye from your body. Drink enough fluid to keep your urine pale yellow.  Ask your health care provider, or the department that is doing the test: ? When will my results be ready? ? How will I get my results? Summary  A cardiac nuclear scan  measures the blood flow to the heart when a person is resting and when he or she is exercising.  Tell your health care provider if you are pregnant.  Before the procedure, ask your health care provider about changing or stopping your regular medicines. This is especially important if you are taking diabetes medicines or blood thinners.  After the procedure, unless your health care provider tells you otherwise, increase your fluid intake. This will help flush the contrast dye from your body.  After the procedure, unless your health care provider tells you otherwise, you may return to your normal schedule, including diet, activities, and medicines. This information is not intended to replace advice given to you by your health care provider. Make sure you discuss any questions you have with your health care provider. Document Revised: 04/28/2018 Document  Reviewed: 04/28/2018 Elsevier Patient Education  [place].

## 2019-12-04 ENCOUNTER — Other Ambulatory Visit: Payer: Self-pay | Admitting: Thoracic Surgery (Cardiothoracic Vascular Surgery)

## 2019-12-04 DIAGNOSIS — Q231 Congenital insufficiency of aortic valve: Secondary | ICD-10-CM

## 2019-12-05 DIAGNOSIS — I712 Thoracic aortic aneurysm, without rupture, unspecified: Secondary | ICD-10-CM | POA: Insufficient documentation

## 2019-12-05 DIAGNOSIS — I25118 Atherosclerotic heart disease of native coronary artery with other forms of angina pectoris: Secondary | ICD-10-CM | POA: Insufficient documentation

## 2019-12-05 DIAGNOSIS — L97211 Non-pressure chronic ulcer of right calf limited to breakdown of skin: Secondary | ICD-10-CM | POA: Insufficient documentation

## 2019-12-07 ENCOUNTER — Encounter: Payer: Self-pay | Admitting: Thoracic Surgery (Cardiothoracic Vascular Surgery)

## 2019-12-07 ENCOUNTER — Ambulatory Visit
Admission: RE | Admit: 2019-12-07 | Discharge: 2019-12-07 | Disposition: A | Discharge status: Home / Self Care | Payer: PRIVATE HEALTH INSURANCE | Source: Ambulatory Visit | Attending: Thoracic Surgery (Cardiothoracic Vascular Surgery) | Admitting: Thoracic Surgery (Cardiothoracic Vascular Surgery)

## 2019-12-07 ENCOUNTER — Ambulatory Visit (INDEPENDENT_AMBULATORY_CARE_PROVIDER_SITE_OTHER): Payer: Self-pay | Admitting: Thoracic Surgery (Cardiothoracic Vascular Surgery)

## 2019-12-07 ENCOUNTER — Other Ambulatory Visit: Payer: Self-pay

## 2019-12-07 VITALS — BP 132/82 | HR 77 | Temp 97.6°F | Resp 20 | Ht 66.0 in | Wt 152.0 lb

## 2019-12-07 DIAGNOSIS — Q231 Congenital insufficiency of aortic valve: Secondary | ICD-10-CM

## 2019-12-07 DIAGNOSIS — Z953 Presence of xenogenic heart valve: Secondary | ICD-10-CM

## 2019-12-07 NOTE — Progress Notes (Signed)
[address]       [place] [address]             [phone]     CARDIOTHORACIC SURGERY OFFICE NOTE  Primary Cardiologist is Ida Rogue, MD PCP is Venia Carbon, MD   HPI:  Patient is a 64 year old male with history of bicuspid aortic valve, ascending aortic aneurysm, coronary artery disease, hypertension, pulmonary hypertension, former smoker, asthma, hyperlipidemia, hypothyroidism, and BPH who underwent biological Bentall aortic root replacement and coronary artery bypass grafting x2 on August 27, 2019 by Dr. Orvan Seen.  He was discharged from the hospital on the seventh postoperative day.  He was readmitted to the hospital September 16, 2019 with severe chest pain and tachycardia.  CT scan and transthoracic echocardiogram revealed slight increase in the amount of pericardial effusion and/or blood in the mediastinum.  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  During his hospitalization he had a brief episode of atrial fibrillation for which he was treated with amiodarone.  He promptly converted to sinus rhythm and was not started on oral anticoagulation.  On the morning of September 23, 2019 had a brief episode of hypotension which resolved without significant intervention.  He was discharged from the hospital later that same day day.  Overnight the patient began to develop bloody drainage from his sternal wound.  The following morning the patient's wife found him unresponsive.  She moved him to the floor and began CPR.  EMS was called and brought him directly to the emergency room where he was unresponsive on arrival.  EKG revealed diffuse ST segment changes suspicious for inferior wall ischemia.    Cardiac gated CT angiogram revealed pericardial tamponade with massive hemopericardium and possible ongoing bleeding from at least one of the proximal vein graft anastomoses from his previous surgery.  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  The patient was taken directly to the operating room for emergency redo sternotomy for evacuation of hemopericardium and ligation of both saphenous vein grafts.  Patient subsequently recovered remarkably uneventfully and was discharged from the hospital on the seventh postoperative day.  Prior to hospital discharge the patient was seen in consultation by Dr. Carlis Abbott from the vascular surgery team for management of the patient's false aneurysm.  The patient was last seen here in our office on October 26, 2019 at which time he was doing well.  Ultimately the patient underwent elective repair of the false aneurysm of the right common femoral artery on October 29, 2019.  Patient has been seen in follow-up on multiple occasions by Dr. Rockey Situ and most recently by Dr. Saunders Revel on December 03, 2019.    Patient returns to our office today for routine follow-up.  He reports that he is doing exceptionally well he feels better than he has felt in a long time.  He does not have any significant residual soreness in his chest.  His exercise tolerance is quite good and he specifically denies any symptoms of exertional chest pain, chest tightness, or shortness of breath.  He has not had any palpitations or dizzy spells.  He states that he has had some intermittent drainage from the saphenous vein harvest incision in his right lower leg, although this has crusted up and has not been draining recently.   Current Outpatient Medications  Medication Sig Dispense Refill  . acetaminophen (TYLENOL) 325 MG tablet Take  2 tablets (650 mg total) by mouth every 6 (six) hours as needed for mild pain.    Marland Kitchen albuterol (VENTOLIN HFA) 108 (90 Base) MCG/ACT inhaler Inhale 2 puffs into the lungs 3 (three) times daily as needed for wheezing or shortness of breath.    Marland Kitchen amiodarone (PACERONE) 200 MG tablet Take 1 tablet (200 mg total) by mouth daily. 90  tablet 0  . aspirin EC 81 MG EC tablet Take 1 tablet (81 mg total) by mouth daily. 30 tablet 0  . atorvastatin (LIPITOR) 80 MG tablet Take 1 tablet (80 mg total) by mouth daily at 6 PM. 30 tablet 11  . cetirizine (ZYRTEC) 10 MG tablet Take 10 mg by mouth daily.    . ferrous sulfate 325 (65 FE) MG EC tablet Take 325 mg by mouth daily with breakfast.    . Fluticasone-Salmeterol (ADVAIR) 100-50 MCG/DOSE AEPB Inhale 1 puff into the lungs 2 (two) times daily. 60 each 11  . Glucosamine-Chondroitin (COSAMIN DS PO) Take 1 tablet by mouth daily.    Marland Kitchen levothyroxine (SYNTHROID) 125 MCG tablet Take 1 tablet by mouth once daily (Patient taking differently: Take 125 mcg by mouth daily before breakfast. ) 90 tablet 3  . traMADol (ULTRAM) 50 MG tablet Take 1 tablet (50 mg total) by mouth every 6 (six) hours as needed. 20 tablet 0   No current facility-administered medications for this visit.      Physical Exam:   BP 132/82   Pulse 77   Temp 97.6 F (36.4 C) (Skin)   Resp 20   Ht 5\' 6"  (1.676 m)   Wt 152 lb (68.9 kg)   SpO2 96% Comment: RA  BMI 24.53 kg/m   General:  Well-appearing  Chest:   Clear to auscultation  CV:   Regular rate and rhythm without murmur  Incisions:  Completely healed, sternum is stable  Abdomen:  Soft nontender  Extremities:  Warm and well-perfused.  Area of chronic inflammation at the inferior aspect of the saphenous vein harvest incision right lower leg has crusted over and healed over but with anatomical characteristics consistent with likely resolving stitch abscess   Diagnostic Tests:  n/a   Impression:  Patient is currently doing well approximately 3 months status post biological Bentall aortic root replacement and coronary artery bypass grafting x2 using a stented bioprosthetic tissue valve complicated by delayed bleeding with massive mediastinal hematoma and pericardial tamponade requiring emergent redo sternotomy for evacuation of mediastinal hematoma.  Both  saphenous vein grafts placed at the time of the patient's original surgery had to be ligated at the time of the second operation.  Patient has recovered remarkably well and is back to normal physical activity.  He has not had any recurrent atrial fibrillation since hospital discharge and only had a brief episode of atrial fib in the hospital following his first operation.  He remains on amiodarone at this time.  He does not report any exertional chest pain or chest tightness or other symptoms which might be construed as angina pectoris.  One of the ligated saphenous vein graft was placed to the very terminal portion of the posterior descending coronary artery of the right coronary artery which is very small.  The second vein graft that was ligated was placed to obtuse marginal branch of the circumflex which was a larger vessel but did not have critical disease.  The patient has recently been seen in follow-up by Dr. Saunders Revel who plans noninvasive stress testing in the near future.  The patient has had some intermittent drainage from his right lower extremity endoscopic vein harvest incision and on exam he has what appears to be a resolved stitch abscess.  At present the incision appears to be healing and no intervention is necessary, but the stitch could be easily resolved in our office if signs of infection recur.   Plan:  We have not recommended any changes in the patient's current medications.  I think it would be reasonable to stop amiodarone but will defer decision-making to Dr. Rockey Situ and Dr. Saunders Revel.  I agree with Dr. Darnelle Bos plan for noninvasive stress testing and continued medical therapy for the patient's coronary artery disease as long as the patient does not have symptoms of angina pectoris.  I have encouraged the patient to continue to increase his physical activity as tolerated.  The patient has been reminded regarding the importance of dental hygiene and the lifelong need for antibiotic prophylaxis for all  dental cleanings and other related invasive procedures.  The patient will continue to follow-up regularly with Dr. Rockey Situ and return to our office for routine follow-up next fall, approximately 1 year following his surgery.  He will call and return sooner should specific problems or questions arise.  Specifically, if the patient develops recurrent drainage, redness, or other signs of infection surrounding his lower extremity saphenous vein harvest incision he will call our office so that he can undergo suture removal by one of our physician assistants.    Valentina Gu. Roxy Manns, MD 12/07/2019 2:50 PM

## 2019-12-07 NOTE — Patient Instructions (Addendum)
Continue all previous medications without any changes at this time.  I would consider stopping amiodarone but do not stop without discussing with Dr. Cathie Hoops may resume unrestricted physical activity without any particular limitations at this time.  Make every effort to stay physically active, get some type of exercise on a regular basis, and stick to a "heart healthy diet".  The long term benefits for regular exercise and a healthy diet are critically important to your overall health and wellbeing.  Endocarditis is a potentially serious infection of heart valves or inside lining of the heart.  It occurs more commonly in patients with diseased heart valves (such as patient's with aortic or mitral valve disease) and in patients who have undergone heart valve repair or replacement.  Certain surgical and dental procedures may put you at risk, such as dental cleaning, other dental procedures, or any surgery involving the respiratory, urinary, gastrointestinal tract, gallbladder or prostate gland.   To minimize your chances for develooping endocarditis, maintain good oral health and seek prompt medical attention for any infections involving the mouth, teeth, gums, skin or urinary tract.    Always notify your doctor or dentist about your underlying heart valve condition before having any invasive procedures. You will need to take antibiotics before certain procedures, including all routine dental cleanings or other dental procedures.  Your cardiologist or dentist should prescribe these antibiotics for you to be taken ahead of time.

## 2019-12-11 ENCOUNTER — Encounter
Admission: RE | Admit: 2019-12-11 | Discharge: 2019-12-11 | Disposition: A | Discharge status: Home / Self Care | Payer: Self-pay | Source: Ambulatory Visit | Attending: Internal Medicine | Admitting: Internal Medicine

## 2019-12-11 ENCOUNTER — Other Ambulatory Visit: Payer: Self-pay

## 2019-12-11 DIAGNOSIS — I25118 Atherosclerotic heart disease of native coronary artery with other forms of angina pectoris: Secondary | ICD-10-CM | POA: Insufficient documentation

## 2019-12-11 DIAGNOSIS — I712 Thoracic aortic aneurysm, without rupture, unspecified: Secondary | ICD-10-CM

## 2019-12-11 LAB — NM MYOCAR MULTI W/SPECT W/WALL MOTION / EF
LV dias vol: 122 mL (ref 62–150)
LV sys vol: 65 mL
Peak HR: 78 {beats}/min
Percent HR: 49 %
Rest HR: 56 {beats}/min
SDS: 12
SRS: 1
SSS: 9
TID: 0.94

## 2019-12-11 MED ORDER — REGADENOSON 0.4 MG/5ML IV SOLN
0.4000 mg | Freq: Once | INTRAVENOUS | Status: AC
  Administered 2019-12-11: 0.4 mg via INTRAVENOUS
  Filled 2019-12-11: qty 5

## 2019-12-11 MED ORDER — TECHNETIUM TC 99M TETROFOSMIN IV KIT
10.0000 | PACK | Freq: Once | INTRAVENOUS | Status: AC | PRN
  Administered 2019-12-11: 10.79 via INTRAVENOUS

## 2019-12-11 MED ORDER — TECHNETIUM TC 99M TETROFOSMIN IV KIT
31.5500 | PACK | Freq: Once | INTRAVENOUS | Status: AC | PRN
  Administered 2019-12-11: 31.55 via INTRAVENOUS

## 2019-12-16 ENCOUNTER — Telehealth: Payer: Self-pay | Admitting: Internal Medicine

## 2019-12-16 NOTE — Telephone Encounter (Signed)
Myoview results were discussed with Jeffrey Tucker, demonstrating normal perfusion.  Though he was noted to have two-vessel CAD involving the LCx and distal RCA/PDA, we have agreed to defer PCI given lack of ischemia and symptoms.  Jeffrey Tucker should continue aggressive secondary prevention and follow-up with Dr. Rockey Situ.  We will cancel his follow-up visit with me next month and instead have him see Dr. Rockey Situ in 4 to 6 weeks for routine follow-up.  It may be worthwhile to repeat some sort of functional testing in 1 to 2 years to ensure that he is not developing silent ischemia.  Nelva Bush, MD [facility]

## 2019-12-16 NOTE — Telephone Encounter (Signed)
I left message for Jeffrey Tucker to call the office to discuss results of myocardial perfusion stress test last week.  Nelva Bush, MD [address] [facility]

## 2019-12-16 NOTE — Telephone Encounter (Signed)
Patient returning call.

## 2019-12-17 NOTE — Telephone Encounter (Signed)
Scheduling,  Can you reach out to schedule patient to see Dr Rockey Situ in 4-6 weeks? I have already cancelled the f/u with Dr End.  Thanks!

## 2019-12-23 ENCOUNTER — Telehealth (HOSPITAL_COMMUNITY): Payer: Self-pay

## 2019-12-23 NOTE — Telephone Encounter (Signed)

## 2019-12-24 ENCOUNTER — Ambulatory Visit (INDEPENDENT_AMBULATORY_CARE_PROVIDER_SITE_OTHER): Payer: Self-pay | Admitting: Physician Assistant

## 2019-12-24 ENCOUNTER — Other Ambulatory Visit: Payer: Self-pay

## 2019-12-24 VITALS — BP 133/85 | HR 66 | Temp 98.6°F | Resp 16 | Ht 66.0 in | Wt 157.0 lb

## 2019-12-24 DIAGNOSIS — I724 Aneurysm of artery of lower extremity: Secondary | ICD-10-CM

## 2019-12-24 NOTE — Progress Notes (Signed)
POST OPERATIVE OFFICE NOTE    CC:  F/u for surgery  HPI:  This is a 64 y.o. male who is s/p open repair of right CFA psa on 12/3//2020 by Dr. Carlis Abbott.  He presents today for follow up.  He states his right groin is still a little swollen.  He does not have any pain.  He states that his incision has healed nicely.  He does not have any non healing wounds or claudication.  He states that he is doing well from his open heart surgery as well and trying to regain his upper body strength.   Allergies  Allergen Reactions  . Tetracycline Hcl Other (See Comments)    Joint pain (and stiffened them, also)  . Beta Adrenergic Blockers     Patient experienced abdomen vasospasms with Metoprolol succinate/tartrate and carvedilol.  . Metoprolol Tartrate     Asthma and cough  . Amlodipine Other (See Comments)    10mg  dose causes lower extremity swelling. Pt can tolerate 5mg  dose.    Current Outpatient Medications  Medication Sig Dispense Refill  . acetaminophen (TYLENOL) 325 MG tablet Take 2 tablets (650 mg total) by mouth every 6 (six) hours as needed for mild pain.    Marland Kitchen albuterol (VENTOLIN HFA) 108 (90 Base) MCG/ACT inhaler Inhale 2 puffs into the lungs 3 (three) times daily as needed for wheezing or shortness of breath.    Marland Kitchen amiodarone (PACERONE) 200 MG tablet Take 1 tablet (200 mg total) by mouth daily. 90 tablet 0  . aspirin EC 81 MG EC tablet Take 1 tablet (81 mg total) by mouth daily. 30 tablet 0  . atorvastatin (LIPITOR) 80 MG tablet Take 1 tablet (80 mg total) by mouth daily at 6 PM. 30 tablet 11  . cetirizine (ZYRTEC) 10 MG tablet Take 10 mg by mouth daily.    . ferrous sulfate 325 (65 FE) MG EC tablet Take 325 mg by mouth daily with breakfast.    . Fluticasone-Salmeterol (ADVAIR) 100-50 MCG/DOSE AEPB Inhale 1 puff into the lungs 2 (two) times daily. 60 each 11  . Glucosamine-Chondroitin (COSAMIN DS PO) Take 1 tablet by mouth daily.    Marland Kitchen levothyroxine (SYNTHROID) 125 MCG tablet Take 1 tablet  by mouth once daily (Patient taking differently: Take 125 mcg by mouth daily before breakfast. ) 90 tablet 3  . traMADol (ULTRAM) 50 MG tablet Take 1 tablet (50 mg total) by mouth every 6 (six) hours as needed. 20 tablet 0   No current facility-administered medications for this visit.     ROS:  See HPI  Physical Exam:  Today's Vitals   12/24/19 1353  BP: 133/85  Pulse: 66  Resp: 16  Temp: 98.6 F (37 C)  SpO2: 97%  Weight: 157 lb (71.2 kg)  Height: 5\' 6"  (1.676 m)   Body mass index is 25.34 kg/m.   Incision:  Healed nicely Extremities:  Easily palpable DP pulses bilaterally.  Easily palpable femoral pulses bilaterally.  The right groin is soft with some mild fullness.  There is no tenderness present.  There is no bruit present.    Assessment/Plan:  This is a 64 y.o. male who is s/p: Open repair of right CFA psa by Dr. Carlis Abbott on 10/29/2019  -pt doing well and incision has healed nicely.  His bilateral femoral and DP pulses are easily palpable.   -he does have some fullness in the right groin that is soft and non tender.  This is most likely a hematoma and  will continue to resolve.  If this has not fully resolved over the next 4-8 weeks, he will call to be seen.  Otherwise, he will f/u as needed.     Leontine Locket, PA-C [facility] [phone]  Clinic MD:  [facility]

## 2020-01-07 ENCOUNTER — Encounter: Payer: Self-pay | Admitting: Thoracic Surgery (Cardiothoracic Vascular Surgery)

## 2020-01-11 ENCOUNTER — Encounter: Payer: Self-pay | Admitting: Thoracic Surgery (Cardiothoracic Vascular Surgery)

## 2020-01-11 ENCOUNTER — Ambulatory Visit: Payer: Self-pay | Admitting: Cardiovascular Disease

## 2020-01-11 ENCOUNTER — Ambulatory Visit (INDEPENDENT_AMBULATORY_CARE_PROVIDER_SITE_OTHER): Payer: Self-pay | Admitting: Thoracic Surgery (Cardiothoracic Vascular Surgery)

## 2020-01-11 ENCOUNTER — Other Ambulatory Visit: Payer: Self-pay

## 2020-01-11 VITALS — BP 150/90 | HR 69 | Temp 97.8°F | Resp 20 | Ht 66.0 in | Wt 156.0 lb

## 2020-01-11 DIAGNOSIS — Z09 Encounter for follow-up examination after completed treatment for conditions other than malignant neoplasm: Secondary | ICD-10-CM

## 2020-01-11 DIAGNOSIS — Z953 Presence of xenogenic heart valve: Secondary | ICD-10-CM

## 2020-01-11 MED ORDER — MICONAZOLE NITRATE 2 % EX CREA: TOPICAL_CREAM | Freq: Two times a day (BID) | CUTANEOUS | Status: DC

## 2020-01-11 NOTE — Patient Instructions (Addendum)
Keep affected area clean and dry.  Use soap and water.  Apply antifungal cream 2x daily  Call for follow up appointment if rash does not resolve

## 2020-01-11 NOTE — Progress Notes (Signed)
[address]       [place] [address]             [phone]     CARDIOTHORACIC SURGERY OFFICE NOTE  Primary Cardiologist is Ida Rogue, MD PCP is Venia Carbon, MD   HPI:  Patient is a 64 year old male with history of bicuspid aortic valve,ascending aortic aneurysm,coronary artery disease, hypertension,pulmonary hypertension, former smoker, asthma, hyperlipidemia, hypothyroidism,andBPHwho underwent biological Bentall aortic root replacement and coronary artery bypass grafting x2 on August 27, 2019 by Dr. Orvan Seen and later underwent emergency redo median sternotomy for evacuation of mediastinal hematoma and ligation of saphenous vein grafts x2 on September 25, 2019 for pericardial tamponade related to bleeding from the proximal anastomosis of the saphenous vein grafts placed at the time of the patient's original surgery.  Patient was last seen here in our office on December 07, 2019 at which time he was doing well.    Patient called our office and presents for an unscheduled appointment because of the development of erythematous rash surrounding right lower leg endoscopic vein harvest incision performed at the time of the patient's original surgery on August 27, 2019.  Patient states that this has been gradually getting worse over the last several weeks.  The rash has become itchy with occasional moist drainage.  Patient has not had any associated fevers and otherwise feels well.   Current Outpatient Medications  Medication Sig Dispense Refill  . acetaminophen (TYLENOL) 325 MG tablet Take 2 tablets (650 mg total) by mouth every 6 (six) hours as needed for mild pain.    Marland Kitchen albuterol (VENTOLIN HFA) 108 (90 Base) MCG/ACT inhaler Inhale 2 puffs into the lungs 3 (three) times daily as needed for wheezing or shortness of breath.    Marland Kitchen aspirin EC 81 MG EC tablet Take 1 tablet (81 mg total) by mouth daily. 30 tablet 0  . atorvastatin (LIPITOR) 80 MG tablet Take 1  tablet (80 mg total) by mouth daily at 6 PM. 30 tablet 11  . cetirizine (ZYRTEC) 10 MG tablet Take 10 mg by mouth daily.    . ferrous sulfate 325 (65 FE) MG EC tablet Take 325 mg by mouth daily with breakfast.    . Fluticasone-Salmeterol (ADVAIR) 100-50 MCG/DOSE AEPB Inhale 1 puff into the lungs 2 (two) times daily. 60 each 11  . Glucosamine-Chondroitin (COSAMIN DS PO) Take 1 tablet by mouth daily.    Marland Kitchen levothyroxine (SYNTHROID) 125 MCG tablet Take 1 tablet by mouth once daily (Patient taking differently: Take 125 mcg by mouth daily before breakfast. ) 90 tablet 3  . loratadine (CLARITIN) 10 MG tablet Take 10 mg by mouth daily.    . traMADol (ULTRAM) 50 MG tablet Take 1 tablet (50 mg total) by mouth every 6 (six) hours as needed. 20 tablet 0   No current facility-administered medications for this visit.      Physical Exam:   BP (!) 150/90   Pulse 69   Temp 97.8 F (36.6 C) (Skin)   Resp 20   Ht 5\' 6"  (1.676 m)   Wt 156 lb (70.8 kg)   SpO2 98% Comment: RA  BMI 25.18 kg/m   General:  Well-appearing  Chest:   Clear to auscultation  CV:   Regular rate and rhythm  Incisions:  Scaly erythematous maculopapular rash surrounding right lower leg endoscopic vein harvest incision consistent with likely Candida infection  Abdomen:  Soft nontender  Extremities:  Warm and well-perfused, no  edema  Diagnostic Tests:  n/a   Impression:  Likely cutaneous yeast infection right lower leg  Plan:  Topical miconazole cream to be applied twice daily for 2 weeks.  Patient will call our office and return for follow-up visit if the rash does not resolve.  I spent in excess of 10 minutes during the conduct of this office consultation and >50% of this time involved direct face-to-face encounter with the patient for counseling and/or coordination of their care.    Valentina Gu. Roxy Manns, MD 01/11/2020 2:31 PM

## 2020-01-12 ENCOUNTER — Other Ambulatory Visit: Payer: Self-pay | Admitting: *Deleted

## 2020-01-12 DIAGNOSIS — B379 Candidiasis, unspecified: Secondary | ICD-10-CM

## 2020-01-12 MED ORDER — MICONAZOLE NITRATE 2 % EX CREA: 1.0000 "application " | TOPICAL_CREAM | Freq: Two times a day (BID) | CUTANEOUS | 1 refills | Status: DC

## 2020-01-13 ENCOUNTER — Ambulatory Visit: Payer: Self-pay | Admitting: Internal Medicine

## 2020-01-24 NOTE — Progress Notes (Signed)
Cardiology Office Note  Date:  01/25/2020   ID:  KANTON BETTIN, DOB 1955-12-09, MRN [id number]  PCP:  Venia Carbon, MD   Chief Complaint  Patient presents with  . Other    Follow up post MYOVIEW. Meds reviewed verbally with patient.     HPI:  Mr. Gilkison is a very pleasant 64 year old gentleman with  moderate aortic valve regurgitation, mild to moderately dilated aortic root 4.1 cm  ascending aorta 4.2 cm, Echo 2020: There is dilatation of the aortic root 4.9 cm and of the ascending aorta 4.4 cm, aortic arch 3.7 cm.  possible bicuspid aortic valve.  He presents for follow-up after aortic valve replacement aortic root replacement, CABG, readmission for bleed   biological Bentall aortic root replacement and coronary artery bypass grafting x2 on August 27, 2019 by Dr. Orvan Seen.   discharged from the hospital on the seventh postoperative day.  readmitted to the hospital September 16, 2019 with severe chest pain and tachycardia. CT scan and transthoracic echocardiogram revealed slight increase in the amount of pericardial effusion and/or blood in the mediastinum.  brief episode of atrial fibrillation , treated with amiodarone. September 23, 2019 had a brief episode of hypotension which resolved without significant intervention.   developed bloody drainage from his sternal wound.  wife found him unresponsive. She began CPR.  EMS was called and brought him directly to the emergency room where he was unresponsive on arrival  intubated and placed on mechanical ventilation. He was seen urgently by Dr Roxy Manns and was taken urgently to the Operating Room for exploration and repair.ligation of grafts  CT scan to have a right femoral pseudoaneurysm and vascular surgical consultation was obtained.    pseudoaneurysm measures 2 x 1 cm with a short neck.  surgical repair   Overall doing well today Compliant with lipitor  Recent stress test with no ischemia, results discussed  Doing upper body  exercise Walking 10 miles a week  No EKG today  PMH:   has a past medical history of Allergy, Aortic root dilatation (HCC), Asthma, Bicuspid aortic valve, BPH (benign prostatic hypertrophy), CAD (coronary artery disease), GERD (gastroesophageal reflux disease), History of kidney stones, Hypertension, Hypertriglyceridemia, Hypothyroidism, Pericardial tamponade (09/25/2019), Severe aortic regurgitation, and Wears glasses.  PSH:    Past Surgical History:  Procedure Laterality Date  . BENTALL PROCEDURE N/A 08/27/2019   Procedure: BENTALL PROCEDURE with a 27 mm KONECT Resilia Aortic Valved Conduit and 30 mm x 30 cm Hemashield Platinum straight graft.;  Surgeon: Wonda Olds, MD;  Location: [facility];  Service: Open Heart Surgery;  Laterality: N/A;  . CARDIAC CATHETERIZATION  2006   [place]  . CARDIOVASCULAR STRESS TEST  2006   [place]  . CHOLECYSTECTOMY  2006  . CORONARY ARTERY BYPASS GRAFT N/A 08/27/2019   Procedure: CORONARY ARTERY BYPASS GRAFTING (CABG) times 2 using right saphenous endoscopic vein harvesting to OM2 nad PDA.;  Surgeon: Wonda Olds, MD;  Location: [place];  Service: Open Heart Surgery;  Laterality: N/A;  . CYSTOSCOPY W/ URETERAL STENT PLACEMENT  05/24/2017   Procedure: left;  Surgeon: Nickie Retort, MD;  Location: [facility] ORS;  Service: Urology;;  . Consuela Mimes W/ URETERAL STENT PLACEMENT Left 06/11/2017   Procedure: CYSTOSCOPY WITH STENT REPLACEMENT;  Surgeon: Hollice Espy, MD;  Location: [facility] ORS;  Service: Urology;  Laterality: Left;  . CYSTOSCOPY WITH BIOPSY Left 06/11/2017   Procedure: RENAL PELVIS TUMOR WITH BIOPSY WITH POSSIBLE LASER ABLATION;  Surgeon: Hollice Espy, MD;  Location: [facility]  ORS;  Service: Urology;  Laterality: Left;  . CYSTOSCOPY WITH STENT PLACEMENT Left 05/24/2017   Procedure: , cystoscopy,left ureteral stent placement and left ureteroscopy attempted, retrograde pylogram;  Surgeon: Nickie Retort, MD;  Location: [facility] ORS;  Service: Urology;   Laterality: Left;  . EYE SURGERY Bilateral    Cataract Extraction with IOL  . FALSE ANEURYSM REPAIR  10/29/2019   REPAIR FALSE ANEURYSM RIGHT FEMORAL   . FALSE ANEURYSM REPAIR Right 10/29/2019   Procedure: REPAIR FALSE ANEURYSM RIGHT FEMORAL;  Surgeon: Marty Heck, MD;  Location: [place];  Service: Vascular;  Laterality: Right;  . HEMATOMA EVACUATION N/A 09/25/2019   Procedure: Evacuation Hematoma - Mediastinal Ligation of saphenous vein graft x two with Cardiopulmonary Bypass;  Surgeon: Rexene Alberts, MD;  Location: [place];  Service: Thoracic;  Laterality: N/A;  . IR THORACENTESIS ASP PLEURAL SPACE W/IMG GUIDE  09/02/2019  . RIGHT/LEFT HEART CATH AND CORONARY ANGIOGRAPHY N/A 08/21/2019   Procedure: RIGHT/LEFT HEART CATH AND CORONARY ANGIOGRAPHY;  Surgeon: Minna Merritts, MD;  Location: [place] CV LAB;  Service: Cardiovascular;  Laterality: N/A;  . STERNOTOMY N/A 09/25/2019   Procedure: REDO STERNOTOMY;  Surgeon: Rexene Alberts, MD;  Location: [place];  Service: Thoracic;  Laterality: N/A;  . TEE WITHOUT CARDIOVERSION N/A 08/27/2019   Procedure: TRANSESOPHAGEAL ECHOCARDIOGRAM (TEE);  Surgeon: Wonda Olds, MD;  Location: [place];  Service: Open Heart Surgery;  Laterality: N/A;  . TEE WITHOUT CARDIOVERSION N/A 09/25/2019   Procedure: Transesophageal Echocardiogram (Tee);  Surgeon: Rexene Alberts, MD;  Location: [place];  Service: Thoracic;  Laterality: N/A;  . URETEROSCOPY WITH HOLMIUM LASER LITHOTRIPSY Left 06/11/2017   Procedure: URETEROSCOPY WITH HOLMIUM LASER LITHOTRIPSY;  Surgeon: Hollice Espy, MD;  Location: [facility] ORS;  Service: Urology;  Laterality: Left;  Marland Kitchen VEIN LIGATION AND STRIPPING Bilateral 1997    Current Outpatient Medications  Medication Sig Dispense Refill  . albuterol (VENTOLIN HFA) 108 (90 Base) MCG/ACT inhaler Inhale 2 puffs into the lungs 3 (three) times daily as needed for wheezing or shortness of breath.    Marland Kitchen aspirin EC 81 MG EC tablet Take 1 tablet  (81 mg total) by mouth daily. 30 tablet 0  . atorvastatin (LIPITOR) 80 MG tablet Take 1 tablet (80 mg total) by mouth daily at 6 PM. 30 tablet 11  . cetirizine (ZYRTEC) 10 MG tablet Take 10 mg by mouth daily.    . ferrous sulfate 325 (65 FE) MG EC tablet Take 325 mg by mouth daily with breakfast.    . Fluticasone-Salmeterol (ADVAIR) 100-50 MCG/DOSE AEPB Inhale 1 puff into the lungs 2 (two) times daily. 60 each 11  . levothyroxine (SYNTHROID) 125 MCG tablet Take 1 tablet by mouth once daily (Patient taking differently: Take 125 mcg by mouth daily before breakfast. ) 90 tablet 3  . loratadine (CLARITIN) 10 MG tablet Take 10 mg by mouth daily.    . miconazole (MICOTIN) 2 % cream Apply 1 application topically 2 (two) times daily. To right lower leg x 2 weeks 28.35 g 1   No current facility-administered medications for this visit.     Allergies:   Tetracycline hcl, Beta adrenergic blockers, Metoprolol tartrate, and Amlodipine   Social History:  The patient  reports that he quit smoking about 41 years ago. His smoking use included cigarettes. He has never used smokeless tobacco. He reports that he does not drink alcohol or use drugs.   Family History:   family history includes Alcohol abuse  in his father and mother; Asthma in his mother; Liver cancer in his paternal grandmother; Prostate cancer in his father.    Review of Systems: Review of Systems  Constitutional: Negative.   HENT: Negative.   Respiratory: Negative.   Cardiovascular: Negative.   Gastrointestinal: Negative.   Musculoskeletal: Negative.   Neurological: Negative.   Psychiatric/Behavioral: Negative.   All other systems reviewed and are negative.   PHYSICAL EXAM: VS:  BP 116/60 (BP Location: Left Arm, Patient Position: Sitting, Cuff Size: Normal)   Pulse 85   Ht 5\' 6"  (1.676 m)   Wt 156 lb (70.8 kg)   SpO2 98%   BMI 25.18 kg/m  , BMI Body mass index is 25.18 kg/m. Constitutional:  oriented to person, place, and time.  No distress.  HENT:  Head: Grossly normal Eyes:  no discharge. No scleral icterus.  Neck: No JVD, no carotid bruits  Cardiovascular: Regular rate and rhythm, 3/6 diastolic murmur Pulmonary/Chest: Clear to auscultation bilaterally, no wheezes or rails Abdominal: Soft.  no distension.  no tenderness.  Musculoskeletal: Normal range of motion Neurological:  normal muscle tone. Coordination normal. No atrophy Skin: Skin warm and dry Psychiatric: normal affect, pleasant   Recent Labs: 09/20/2019: B Natriuretic Peptide 604.5 09/26/2019: Magnesium 1.7 10/09/2019: TSH 42.81 10/29/2019: ALT 16 10/30/2019: BUN 11; Creatinine, Ser 0.73; Hemoglobin 11.0; Platelets 322; Potassium 4.0; Sodium 137    Lipid Panel Lab Results  Component Value Date   CHOL 184 04/02/2019   HDL 29.60 (L) 04/02/2019   LDLCALC 103 (H) 05/26/2012   TRIG 207.0 (H) 04/02/2019      Wt Readings from Last 3 Encounters:  01/25/20 156 lb (70.8 kg)  01/11/20 156 lb (70.8 kg)  12/24/19 157 lb (71.2 kg)     ASSESSMENT AND PLAN:  Bicuspid aortic valve -  Aortic valve insufficiency, Bioprosthetic valve with complications as detailed above  Ascending aorta dilatation ([place]) - Plan: EKG 12- Bentall aortic root replacement Has follow-up with surgery in several months time  CAD with stable angina Evaluated by interventional cardiology Stress test with no high risk ischemia Stressed importance of aggressive lipid control LDL of 60 He needs to have TSH checked with primary care, will see if they can add on lipid panel  Paroxysmal atrial fibrillation  Maintaining normal sinus rhythm   Total encounter time more than 25 minutes  Greater than 50% was spent in counseling and coordination of care with the patient   Disposition:   F/U  12 months   No orders of the defined types were placed in this encounter.    Signed, Esmond Plants, M.D., Ph.D. 01/25/2020  [place],  [place]

## 2020-01-25 ENCOUNTER — Ambulatory Visit (INDEPENDENT_AMBULATORY_CARE_PROVIDER_SITE_OTHER): Payer: Self-pay | Admitting: Cardiovascular Disease

## 2020-01-25 ENCOUNTER — Other Ambulatory Visit: Payer: Self-pay

## 2020-01-25 ENCOUNTER — Encounter: Payer: Self-pay | Admitting: Cardiovascular Disease

## 2020-01-25 VITALS — BP 116/60 | HR 85 | Ht 66.0 in | Wt 156.0 lb

## 2020-01-25 DIAGNOSIS — I25118 Atherosclerotic heart disease of native coronary artery with other forms of angina pectoris: Secondary | ICD-10-CM

## 2020-01-25 DIAGNOSIS — I480 Paroxysmal atrial fibrillation: Secondary | ICD-10-CM

## 2020-01-25 DIAGNOSIS — Z953 Presence of xenogenic heart valve: Secondary | ICD-10-CM

## 2020-01-25 DIAGNOSIS — I712 Thoracic aortic aneurysm, without rupture, unspecified: Secondary | ICD-10-CM

## 2020-01-25 NOTE — Patient Instructions (Addendum)
Talk with Dr. Silvio Pate about labs Lipid panel  Medication Instructions:  No changes  If you need a refill on your cardiac medications before your next appointment, please call your pharmacy.   Lab work: No new labs needed   If you have labs (blood work) drawn today and your tests are completely normal, you will receive your results only by: Marland Kitchen MyChart Message (if you have MyChart) OR . A paper copy in the mail If you have any lab test that is abnormal or we need to change your treatment, we will call you to review the results.   Testing/Procedures: No new testing needed   Follow-Up: At [facility], you and your health needs are our priority.  As part of our continuing mission to provide you with exceptional heart care, we have created designated Provider Care Teams.  These Care Teams include your primary Cardiologist (physician) and Advanced Practice Providers (APPs -  Physician Assistants and Nurse Practitioners) who all work together to provide you with the care you need, when you need it.  . You will need a follow up appointment in 12 months  . Providers on your designated Care Team:   . Murray Hodgkins, NP . Christell Faith, PA-C . Marrianne Mood, PA-C  Any Other Special Instructions Will Be Listed Below (If Applicable).  For educational health videos Log in to : [email or web address] Or : [email or web address], password : [email or web address]

## 2020-01-26 ENCOUNTER — Other Ambulatory Visit: Payer: Self-pay | Admitting: Internal Medicine

## 2020-01-26 DIAGNOSIS — R946 Abnormal results of thyroid function studies: Secondary | ICD-10-CM

## 2020-01-26 NOTE — Progress Notes (Signed)
Please set up lab work in the next month or so

## 2020-01-26 NOTE — Progress Notes (Signed)
Spoke to pt. Made lab appt.

## 2020-02-02 ENCOUNTER — Other Ambulatory Visit: Payer: Self-pay | Admitting: Cardiovascular Disease

## 2020-02-02 ENCOUNTER — Encounter: Payer: Self-pay | Admitting: Thoracic Surgery (Cardiothoracic Vascular Surgery)

## 2020-02-03 MED ORDER — FLUCONAZOLE 150 MG PO TABS: 150.0000 mg | ORAL_TABLET | Freq: Once | ORAL | 1 refills | Status: AC

## 2020-02-08 MED ORDER — FLUTICASONE-SALMETEROL 100-50 MCG/DOSE IN AEPB: 1.0000 | INHALATION_SPRAY | Freq: Two times a day (BID) | RESPIRATORY_TRACT | 11 refills | Status: DC

## 2020-02-25 ENCOUNTER — Other Ambulatory Visit (INDEPENDENT_AMBULATORY_CARE_PROVIDER_SITE_OTHER): Payer: Self-pay

## 2020-02-25 ENCOUNTER — Other Ambulatory Visit: Payer: Self-pay

## 2020-02-25 DIAGNOSIS — R946 Abnormal results of thyroid function studies: Secondary | ICD-10-CM

## 2020-02-25 LAB — LIPID PANEL
Cholesterol: 125 mg/dL (ref 0–200)
HDL: 28.5 mg/dL — ABNORMAL LOW (ref >39.00)
LDL Cholesterol: 60 mg/dL (ref 0–99)
NonHDL: 96.49
Total CHOL/HDL Ratio: 4
Triglycerides: 184 mg/dL — ABNORMAL HIGH (ref 0.0–149.0)
VLDL: 36.8 mg/dL (ref 0.0–40.0)

## 2020-02-25 LAB — T4, FREE: Free T4: 0.66 ng/dL (ref 0.60–1.60)

## 2020-02-25 LAB — TSH: TSH: 23.91 u[IU]/mL — ABNORMAL HIGH (ref 0.35–4.50)

## 2020-03-07 MED ORDER — LEVOTHYROXINE SODIUM 150 MCG PO TABS: 150.0000 ug | ORAL_TABLET | Freq: Every day | ORAL | 3 refills | Status: DC

## 2020-05-19 ENCOUNTER — Ambulatory Visit (INDEPENDENT_AMBULATORY_CARE_PROVIDER_SITE_OTHER): Payer: Self-pay | Admitting: Internal Medicine

## 2020-05-19 ENCOUNTER — Encounter: Payer: Self-pay | Admitting: Internal Medicine

## 2020-05-19 ENCOUNTER — Other Ambulatory Visit: Payer: Self-pay

## 2020-05-19 VITALS — BP 136/86 | HR 62 | Temp 97.7°F | Ht 66.0 in | Wt 165.0 lb

## 2020-05-19 DIAGNOSIS — Z0000 Encounter for general adult medical examination without abnormal findings: Secondary | ICD-10-CM

## 2020-05-19 DIAGNOSIS — I2510 Atherosclerotic heart disease of native coronary artery without angina pectoris: Secondary | ICD-10-CM

## 2020-05-19 DIAGNOSIS — E039 Hypothyroidism, unspecified: Secondary | ICD-10-CM

## 2020-05-19 DIAGNOSIS — J4520 Mild intermittent asthma, uncomplicated: Secondary | ICD-10-CM

## 2020-05-19 MED ORDER — ATORVASTATIN CALCIUM 40 MG PO TABS
40.0000 mg | ORAL_TABLET | Freq: Every day | ORAL | 3 refills | Status: DC
Start: 2020-05-19 — End: 2021-05-25

## 2020-05-19 NOTE — Assessment & Plan Note (Signed)
Will recheck labs on higher thyroid dose

## 2020-05-19 NOTE — Assessment & Plan Note (Signed)
Quiet on inhaler

## 2020-05-19 NOTE — Assessment & Plan Note (Signed)
On ASA Recommended going back on statin--will try lower dose

## 2020-05-19 NOTE — Assessment & Plan Note (Signed)
Healthy Colon due 2023 Recommended COVID vaccine--he prefers not Flu vaccine in fall--will consider Defer PSA

## 2020-05-19 NOTE — Progress Notes (Signed)
Subjective:    Patient ID: Jeffrey Tucker, male    DOB: 1956-04-21, 64 y.o.   MRN: [id number]  HPI Here for physical  Feeling pretty back to normal physically Had been exercising Walks a lot, resistance work, Social research officer, government Now retired  Wife is concerned he may have scoliosis Only occasional back pain with walking (dull lower pain) No abnormality in gait  Has had some slight dizziness ---"uneasy" feeling when walking Veered off to right once Had dizzy spell about 2 months ago also  He stopped the statin Read "all these bad things" Using a natural product now  Current Outpatient Medications on File Prior to Visit  Medication Sig Dispense Refill   albuterol (VENTOLIN HFA) 108 (90 Base) MCG/ACT inhaler Inhale 2 puffs into the lungs 3 (three) times daily as needed for wheezing or shortness of breath.     aspirin EC 81 MG EC tablet Take 1 tablet (81 mg total) by mouth daily. 30 tablet 0   Fluticasone-Salmeterol (ADVAIR) 100-50 MCG/DOSE AEPB Inhale 1 puff into the lungs 2 (two) times daily. 60 each 11   levocetirizine (XYZAL) 5 MG tablet Take 5 mg by mouth every evening.     levothyroxine (SYNTHROID) 150 MCG tablet Take 1 tablet (150 mcg total) by mouth daily. 90 tablet 3   No current facility-administered medications on file prior to visit.    Allergies  Allergen Reactions   Tetracycline Hcl Other (See Comments)    Joint pain (and stiffened them, also)   Beta Adrenergic Blockers     Patient experienced abdomen vasospasms with Metoprolol succinate/tartrate and carvedilol.   Metoprolol Tartrate     Asthma and cough   Amlodipine Other (See Comments)    10mg  dose causes lower extremity swelling. Pt can tolerate 5mg  dose.    Past Medical History:  Diagnosis Date   Allergy    allergic rhinitis   Aortic root dilatation (HCC)    a. s/p surgical repair 08/27/2019   Asthma    mostly in childhood   Bicuspid aortic valve    a.  Status post bioprosthetic replacement  08/27/2019   BPH (benign prostatic hypertrophy)    CAD (coronary artery disease)    a. s/p 2-v CABG 08/2019 (SVG-OM, SVG--rPDA) *Grafts subsequently ligated due to bleeding   GERD (gastroesophageal reflux disease)    History of kidney stones    Hypertension    Hypertriglyceridemia    Hypothyroidism    Pericardial tamponade 09/25/2019   Severe aortic regurgitation    a. bicuspid aortic valve, b. s/p bio Bentall procedure 08/2019   Wears glasses     Past Surgical History:  Procedure Laterality Date   BENTALL PROCEDURE N/A 08/27/2019   Procedure: BENTALL PROCEDURE with a 27 mm KONECT Resilia Aortic Valved Conduit and 30 mm x 30 cm Hemashield Platinum straight graft.;  Surgeon: Wonda Olds, MD;  Location: [place];  Service: Open Heart Surgery;  Laterality: N/A;   CARDIAC CATHETERIZATION  2006   [place]   CARDIOVASCULAR STRESS TEST  2006   [place]   CHOLECYSTECTOMY  2006   CORONARY ARTERY BYPASS GRAFT N/A 08/27/2019   Procedure: CORONARY ARTERY BYPASS GRAFTING (CABG) times 2 using right saphenous endoscopic vein harvesting to OM2 nad PDA.;  Surgeon: Wonda Olds, MD;  Location: [place];  Service: Open Heart Surgery;  Laterality: N/A;   CYSTOSCOPY W/ URETERAL STENT PLACEMENT  05/24/2017   Procedure: left;  Surgeon: Nickie Retort, MD;  Location: [facility];  Service: Urology;;  CYSTOSCOPY W/ URETERAL STENT PLACEMENT Left 06/11/2017   Procedure: CYSTOSCOPY WITH STENT REPLACEMENT;  Surgeon: Hollice Espy, MD;  Location: [facility];  Service: Urology;  Laterality: Left;   CYSTOSCOPY WITH BIOPSY Left 06/11/2017   Procedure: RENAL PELVIS TUMOR WITH BIOPSY WITH POSSIBLE LASER ABLATION;  Surgeon: Hollice Espy, MD;  Location: [facility];  Service: Urology;  Laterality: Left;   CYSTOSCOPY WITH STENT PLACEMENT Left 05/24/2017   Procedure: , cystoscopy,left ureteral stent placement and left ureteroscopy attempted, retrograde pylogram;  Surgeon: Nickie Retort, MD;   Location: [facility];  Service: Urology;  Laterality: Left;   EYE SURGERY Bilateral    Cataract Extraction with IOL   FALSE ANEURYSM REPAIR  10/29/2019   REPAIR FALSE ANEURYSM RIGHT FEMORAL    FALSE ANEURYSM REPAIR Right 10/29/2019   Procedure: REPAIR FALSE ANEURYSM RIGHT FEMORAL;  Surgeon: Marty Heck, MD;  Location: [place];  Service: Vascular;  Laterality: Right;   HEMATOMA EVACUATION N/A 09/25/2019   Procedure: Evacuation Hematoma - Mediastinal Ligation of saphenous vein graft x two with Cardiopulmonary Bypass;  Surgeon: Rexene Alberts, MD;  Location: [place];  Service: Thoracic;  Laterality: N/A;   IR THORACENTESIS ASP PLEURAL SPACE W/IMG GUIDE  09/02/2019   RIGHT/LEFT HEART CATH AND CORONARY ANGIOGRAPHY N/A 08/21/2019   Procedure: RIGHT/LEFT HEART CATH AND CORONARY ANGIOGRAPHY;  Surgeon: Minna Merritts, MD;  Location: [place] CV LAB;  Service: Cardiovascular;  Laterality: N/A;   STERNOTOMY N/A 09/25/2019   Procedure: REDO STERNOTOMY;  Surgeon: Rexene Alberts, MD;  Location: [place];  Service: Thoracic;  Laterality: N/A;   TEE WITHOUT CARDIOVERSION N/A 08/27/2019   Procedure: TRANSESOPHAGEAL ECHOCARDIOGRAM (TEE);  Surgeon: Wonda Olds, MD;  Location: [place];  Service: Open Heart Surgery;  Laterality: N/A;   TEE WITHOUT CARDIOVERSION N/A 09/25/2019   Procedure: Transesophageal Echocardiogram (Tee);  Surgeon: Rexene Alberts, MD;  Location: [place];  Service: Thoracic;  Laterality: N/A;   URETEROSCOPY WITH HOLMIUM LASER LITHOTRIPSY Left 06/11/2017   Procedure: URETEROSCOPY WITH HOLMIUM LASER LITHOTRIPSY;  Surgeon: Hollice Espy, MD;  Location: [facility];  Service: Urology;  Laterality: Left;   VEIN LIGATION AND STRIPPING Bilateral 1997    Family History  Problem Relation Age of Onset   Alcohol abuse Mother    Asthma Mother    Alcohol abuse Father    Prostate cancer Father    Liver cancer Paternal Grandmother    Colon cancer Neg Hx    Coronary artery  disease Neg Hx    Hypertension Neg Hx    Diabetes Neg Hx    Bladder Cancer Neg Hx    Kidney cancer Neg Hx     Social History   Socioeconomic History   Marital status: Married    Spouse name: Nobel Brar   Number of children: 2   Years of education: Not on file   Highest education level: Not on file  Occupational History   Occupation: Sales promotion account executive   Occupation:      Comment:     Occupation: Adult nurse estate    Comment: Retired  Tobacco Use   Smoking status: Former Smoker    Types: Cigarettes    Quit date: 11/26/1978    Years since quitting: 41.5   Smokeless tobacco: Never Used   Tobacco comment: social smoked till 1980's  Vaping Use   Vaping Use: Never used  Substance and Sexual Activity   Alcohol use: No    Alcohol/week: 0.0 standard drinks    Comment:  none since 1984   Drug use: No   Sexual activity: Not on file  Other Topics Concern   Not on file  Social History Narrative   ** Merged History Encounter **       Social Determinants of Health   Financial Resource Strain:    Difficulty of Paying Living Expenses:   Food Insecurity:    Worried About Charity fundraiser in the Last Year:    Arboriculturist in the Last Year:   Transportation Needs:    Film/video editor (Medical):    Lack of Transportation (Non-Medical):   Physical Activity:    Days of Exercise per Week:    Minutes of Exercise per Session:   Stress:    Feeling of Stress :   Social Connections:    Frequency of Communication with Friends and Family:    Frequency of Social Gatherings with Friends and Family:    Attends Religious Services:    Active Member of Clubs or Organizations:    Attends Music therapist:    Marital Status:   Intimate Partner Violence:    Fear of Current or Ex-Partner:    Emotionally Abused:    Physically Abused:    Sexually Abused:    Review of Systems  Constitutional: Negative for fatigue.        Weight up some Wears seat belt  HENT: Negative for dental problem and tinnitus.        Mild high frequency hearing loss Keeps up with dentist  Eyes: Negative for visual disturbance.       No diplopia or unilateral vision loss  Respiratory: Negative for cough, chest tightness and shortness of breath.   Cardiovascular: Negative for chest pain, palpitations and leg swelling.       Can feel heart at times  Gastrointestinal: Negative for abdominal pain, blood in stool and constipation.  Endocrine: Negative for polydipsia and polyuria.  Genitourinary:       Some slow stream--not a bother No sexual problems  Musculoskeletal: Negative for arthralgias and joint swelling.  Skin: Negative for rash.  Allergic/Immunologic: Positive for environmental allergies. Negative for immunocompromised state.       Doing better with xzyal  Neurological: Positive for dizziness. Negative for syncope and headaches.  Hematological: Does not bruise/bleed easily.       ?swelling over medial clavicles  Psychiatric/Behavioral: Negative for dysphoric mood and sleep disturbance. The patient is not nervous/anxious.        Does snore but no apnea       Objective:   Physical Exam  Constitutional: He is oriented to person, place, and time. No distress.  HENT:  Head: Normocephalic and atraumatic.  Right Ear: Tympanic membrane and ear canal normal.  Left Ear: Tympanic membrane and ear canal normal.  Mouth/Throat: Mucous membranes are moist.  Eyes: Pupils are equal, round, and reactive to light. Conjunctivae are normal.  Neck:  Puffiness without nodes in supraclavicular areas  Cardiovascular: Normal rate, regular rhythm and normal pulses. Exam reveals no gallop.  No murmur heard. Prominent A2  Respiratory: Effort normal and breath sounds normal. He has no wheezes. He has no rales.  GI: Soft. Normal appearance. There is no abdominal tenderness.  Musculoskeletal:     Cervical back: Neck supple.     Right lower leg:  No edema.     Left lower leg: No edema.     Comments: No scoliosis  Lymphadenopathy:    He has no cervical adenopathy.  Neurological: He is alert and oriented to person, place, and time.  Skin: Skin is warm. No rash noted.  Psychiatric: His behavior is normal. Mood normal.           Assessment & Plan:

## 2020-05-20 LAB — COMPREHENSIVE METABOLIC PANEL
ALT: 16 U/L (ref 0–53)
AST: 20 U/L (ref 0–37)
Albumin: 4.4 g/dL (ref 3.5–5.2)
Alkaline Phosphatase: 110 U/L (ref 39–117)
BUN: 12 mg/dL (ref 6–23)
CO2: 24 mEq/L (ref 19–32)
Calcium: 9.5 mg/dL (ref 8.4–10.5)
Chloride: 102 mEq/L (ref 96–112)
Creatinine, Ser: 0.87 mg/dL (ref 0.40–1.50)
GFR: 88.23 mL/min (ref >60.00)
Glucose, Bld: 84 mg/dL (ref 70–99)
Potassium: 4.6 mEq/L (ref 3.5–5.1)
Sodium: 140 mEq/L (ref 135–145)
Total Bilirubin: 0.5 mg/dL (ref 0.2–1.2)
Total Protein: 7.4 g/dL (ref 6.0–8.3)

## 2020-05-20 LAB — CBC
HCT: 41.6 % (ref 39.0–52.0)
Hemoglobin: 13.9 g/dL (ref 13.0–17.0)
MCHC: 33.5 g/dL (ref 30.0–36.0)
MCV: 94.1 fl (ref 78.0–100.0)
Platelets: 294 10*3/uL (ref 150.0–400.0)
RBC: 4.41 Mil/uL (ref 4.22–5.81)
RDW: 14.1 % (ref 11.5–15.5)
WBC: 5.7 10*3/uL (ref 4.0–10.5)

## 2020-05-20 LAB — T4, FREE: Free T4: 1.33 ng/dL (ref 0.60–1.60)

## 2020-05-20 LAB — TSH: TSH: 0.51 u[IU]/mL (ref 0.35–4.50)

## 2020-08-29 ENCOUNTER — Encounter: Payer: Self-pay | Admitting: Thoracic Surgery (Cardiothoracic Vascular Surgery)

## 2020-08-29 ENCOUNTER — Telehealth (INDEPENDENT_AMBULATORY_CARE_PROVIDER_SITE_OTHER): Payer: Self-pay | Admitting: Thoracic Surgery (Cardiothoracic Vascular Surgery)

## 2020-08-29 DIAGNOSIS — Z953 Presence of xenogenic heart valve: Secondary | ICD-10-CM

## 2020-08-29 NOTE — Progress Notes (Signed)
[address]       [place] [address]             [facility] VIRTUAL OFFICE NOTE  Referring Provider is Rockey Situ, Kathlene November, MD Primary Cardiologist is Ida Rogue, MD PCP is Venia Carbon, MD   HPI:  I spoke with Donnetta Simpers (DOB August 13, 1956 ) via telephone on 08/29/2020 at 2:17 PM and verified that I was speaking with the correct person using more than one form of identification.  We discussed the fact that I was contacting them from my office and they were located at home, as well as the reason(s) for conducting our visit virtually instead of in-person.  The patient expressed understanding the circumstances and agreed to proceed as described.   Patient is a 64 year old male with history of bicuspid aortic valve,ascending aortic aneurysm,coronary artery disease, hypertension,pulmonary hypertension, former smoker, asthma, hyperlipidemia, hypothyroidism,andBPHwho underwent biological Bentall aortic root replacement and coronary artery bypass grafting x2 on August 27, 2019 by Dr. Orvan Seen and later underwent emergency redo median sternotomy for evacuation of mediastinal hematoma and ligation of saphenous vein grafts x2 on September 25, 2019 for pericardial tamponade related to bleeding from the proximal anastomosis of the saphenous vein grafts placed at the time of the patient's original surgery.    His postoperative recovery was uneventful and he later underwent a nuclear stress test which was felt to be low risk for coronary ischemia.  Patient was last seen here in our office on January 11, 2020 at which time he was doing well.    I spoke with the patient over the telephone today to check and see how he is getting along.  He states that he feels like he is doing better and in better shape than he has been in years.  He exercises on a daily basis and he reports absolutely no limitations.  He specifically denies any symptoms of  exertional shortness of breath or exertional chest discomfort even with strenuous exertion.  He is delighted with his outcome.  He continues to follow-up regularly with his primary care physician and he will be scheduled for routine follow-up visit with Dr. Rockey Situ at some point within the next few months.   Current Outpatient Medications  Medication Sig Dispense Refill  . albuterol (VENTOLIN HFA) 108 (90 Base) MCG/ACT inhaler Inhale 2 puffs into the lungs 3 (three) times daily as needed for wheezing or shortness of breath.    Marland Kitchen aspirin EC 81 MG EC tablet Take 1 tablet (81 mg total) by mouth daily. 30 tablet 0  . atorvastatin (LIPITOR) 40 MG tablet Take 1 tablet (40 mg total) by mouth daily. 90 tablet 3  . Fluticasone-Salmeterol (ADVAIR) 100-50 MCG/DOSE AEPB Inhale 1 puff into the lungs 2 (two) times daily. 60 each 11  . levocetirizine (XYZAL) 5 MG tablet Take 5 mg by mouth every evening.    Marland Kitchen levothyroxine (SYNTHROID) 150 MCG tablet Take 1 tablet (150 mcg total) by mouth daily. 90 tablet 3   No current facility-administered medications for this visit.     Diagnostic Tests:  n/a   Impression:  Patient is doing very well approximately 1 year status post Bentall aortic root replacement using a bioprosthetic tissue valve synthetic root conduit and coronary artery bypass grafting x2 which was later complicated by pericardial tamponade requiring emergency sternotomy for ligation of coronary artery bypass graft.  Nuclear stress test performed January of this year  was felt to be low risk for myocardial ischemia and currently the patient is doing remarkably well on continued medical therapy.    Plan:  We have not recommended any change the patient's current medications.  He will continue to follow-up intermittently with Dr. Rockey Situ.  In the future he will call and return to see [place] in our office as needed.  All questions have been answered.    I discussed limitations of evaluation and  management via telephone.  The patient was advised to call back for repeat telephone consultation or to seek an in-person evaluation if questions arise or the patient's clinical condition changes in any significant manner.  I spent in excess of 10 minutes of non-face-to-face time during the conduct of this telephone virtual office consultation, including pre-visit review of the patient's records and direct conversation with the patient.      Valentina Gu. Roxy Manns, MD 08/29/2020 2:17 PM

## 2021-01-30 NOTE — Progress Notes (Signed)
Cardiology Office Note  Date:  01/31/2021   ID:  Jeffrey Tucker, DOB 01-Jun-1956, MRN [id number]  PCP:  Venia Carbon, MD   Chief Complaint  Patient presents with  . 12 month follow up     "doing well." Medications reviewed by the patient verbally.     HPI:  Jeffrey Tucker is a very pleasant 65 year old gentleman with  moderate aortic valve regurgitation, Echo 2020:  dilatation of the aortic root 4.9 cm and of the ascending aorta 4.4 cm, aortic arch 3.7 cm. bicuspid aortic valve.   biological Bentall aortic root replacement and coronary artery bypass grafting x2 on August 27, 2019 He presents for follow-up after aortic valve replacement aortic root replacement, CABG, readmission for bleed  In follow-up today reports he is doing very well Retired, Regular exercise program Denies chest pain concerning for angina  Chronic bradycardia, no sx  Doing upper body exercise Walking 10 miles a week  Lab work reviewed TSH 42,  Thyroid supplement increased 150 , TSH 0.5  Weight up, Trying to lose weight  Compliant with lipitor Writing a book on retirement  Prior stress test with no ischemia reviewed again  EKG personally reviewed by myself on todays visit Normal sinus rhythm nonspecific ST-T wave abnormality  Lab Results  Component Value Date   CHOL 125 02/25/2020   HDL 28.50 (L) 02/25/2020   LDLCALC 60 02/25/2020   TRIG 184.0 (H) 02/25/2020   Prior cardiac history reviewed   biological Bentall aortic root replacement and coronary artery bypass grafting x2 on August 27, 2019 by Dr. Orvan Seen.   discharged from the hospital on the seventh postoperative day.  readmitted to the hospital September 16, 2019 with severe chest pain and tachycardia. CT scan and transthoracic echocardiogram revealed slight increase in the amount of pericardial effusion and/or blood in the mediastinum.  brief episode of atrial fibrillation , treated with amiodarone. September 23, 2019 had a brief episode of  hypotension which resolved without significant intervention.   developed bloody drainage from his sternal wound.  wife found him unresponsive. She began CPR.  EMS was called and brought him directly to the emergency room where he was unresponsive on arrival  intubated and placed on mechanical ventilation. He was seen urgently by Dr Roxy Manns and was taken urgently to the Operating Room for exploration and repair.ligation of grafts  CT scan to have a right femoral pseudoaneurysm and vascular surgical consultation was obtained.    pseudoaneurysm measures 2 x 1 cm with a short neck.  surgical repair    PMH:   has a past medical history of Allergy, Aortic root dilatation ([place]), Asthma, Bicuspid aortic valve, BPH (benign prostatic hypertrophy), CAD (coronary artery disease), GERD (gastroesophageal reflux disease), History of kidney stones, Hypertension, Hypertriglyceridemia, Hypothyroidism, Pericardial tamponade (09/25/2019), Severe aortic regurgitation, and Wears glasses.  PSH:    Past Surgical History:  Procedure Laterality Date  . BENTALL PROCEDURE N/A 08/27/2019   Procedure: BENTALL PROCEDURE with a 27 mm KONECT Resilia Aortic Valved Conduit and 30 mm x 30 cm Hemashield Platinum straight graft.;  Surgeon: Wonda Olds, MD;  Location: [facility];  Service: Open Heart Surgery;  Laterality: N/A;  . CARDIAC CATHETERIZATION  2006   [place]  . CARDIOVASCULAR STRESS TEST  2006   [place]  . CHOLECYSTECTOMY  2006  . CORONARY ARTERY BYPASS GRAFT N/A 08/27/2019   Procedure: CORONARY ARTERY BYPASS GRAFTING (CABG) times 2 using right saphenous endoscopic vein harvesting to OM2 nad PDA.;  Surgeon: Fredrich Romans  Z, MD;  Location: [facility];  Service: Open Heart Surgery;  Laterality: N/A;  . CYSTOSCOPY W/ URETERAL STENT PLACEMENT  05/24/2017   Procedure: left;  Surgeon: Nickie Retort, MD;  Location: [facility];  Service: Urology;;  . Jeffrey Tucker W/ URETERAL STENT PLACEMENT Left 06/11/2017   Procedure:  CYSTOSCOPY WITH STENT REPLACEMENT;  Surgeon: Hollice Espy, MD;  Location: [facility];  Service: Urology;  Laterality: Left;  . CYSTOSCOPY WITH BIOPSY Left 06/11/2017   Procedure: RENAL PELVIS TUMOR WITH BIOPSY WITH POSSIBLE LASER ABLATION;  Surgeon: Hollice Espy, MD;  Location: [facility];  Service: Urology;  Laterality: Left;  . CYSTOSCOPY WITH STENT PLACEMENT Left 05/24/2017   Procedure: , cystoscopy,left ureteral stent placement and left ureteroscopy attempted, retrograde pylogram;  Surgeon: Nickie Retort, MD;  Location: [facility];  Service: Urology;  Laterality: Left;  . EYE SURGERY Bilateral    Cataract Extraction with IOL  . FALSE ANEURYSM REPAIR  10/29/2019   REPAIR FALSE ANEURYSM RIGHT FEMORAL   . FALSE ANEURYSM REPAIR Right 10/29/2019   Procedure: REPAIR FALSE ANEURYSM RIGHT FEMORAL;  Surgeon: Marty Heck, MD;  Location: [facility];  Service: Vascular;  Laterality: Right;  . HEMATOMA EVACUATION N/A 09/25/2019   Procedure: Evacuation Hematoma - Mediastinal Ligation of saphenous vein graft x two with Cardiopulmonary Bypass;  Surgeon: Rexene Alberts, MD;  Location: [facility];  Service: Thoracic;  Laterality: N/A;  . IR THORACENTESIS ASP PLEURAL SPACE W/IMG GUIDE  09/02/2019  . RIGHT/LEFT HEART CATH AND CORONARY ANGIOGRAPHY N/A 08/21/2019   Procedure: RIGHT/LEFT HEART CATH AND CORONARY ANGIOGRAPHY;  Surgeon: Minna Merritts, MD;  Location: [facility];  Service: Cardiovascular;  Laterality: N/A;  . STERNOTOMY N/A 09/25/2019   Procedure: REDO STERNOTOMY;  Surgeon: Rexene Alberts, MD;  Location: [facility];  Service: Thoracic;  Laterality: N/A;  . TEE WITHOUT CARDIOVERSION N/A 08/27/2019   Procedure: TRANSESOPHAGEAL ECHOCARDIOGRAM (TEE);  Surgeon: Wonda Olds, MD;  Location: [facility];  Service: Open Heart Surgery;  Laterality: N/A;  . TEE WITHOUT CARDIOVERSION N/A 09/25/2019   Procedure: Transesophageal Echocardiogram (Tee);  Surgeon: Rexene Alberts, MD;  Location: [facility];   Service: Thoracic;  Laterality: N/A;  . URETEROSCOPY WITH HOLMIUM LASER LITHOTRIPSY Left 06/11/2017   Procedure: URETEROSCOPY WITH HOLMIUM LASER LITHOTRIPSY;  Surgeon: Hollice Espy, MD;  Location: [facility];  Service: Urology;  Laterality: Left;  Marland Kitchen VEIN LIGATION AND STRIPPING Bilateral 1997    Current Outpatient Medications  Medication Sig Dispense Refill  . albuterol (VENTOLIN HFA) 108 (90 Base) MCG/ACT inhaler Inhale 2 puffs into the lungs 3 (three) times daily as needed for wheezing or shortness of breath.    Marland Kitchen aspirin EC 81 MG EC tablet Take 1 tablet (81 mg total) by mouth daily. 30 tablet 0  . atorvastatin (LIPITOR) 40 MG tablet Take 1 tablet (40 mg total) by mouth daily. 90 tablet 3  . Fluticasone-Salmeterol (ADVAIR) 100-50 MCG/DOSE AEPB Inhale 1 puff into the lungs 2 (two) times daily. 60 each 11  . levocetirizine (XYZAL) 5 MG tablet Take 5 mg by mouth every evening.    Marland Kitchen levothyroxine (SYNTHROID) 150 MCG tablet Take 1 tablet (150 mcg total) by mouth daily. 90 tablet 3   No current facility-administered medications for this visit.     Allergies:   Tetracycline hcl, Beta adrenergic blockers, Metoprolol tartrate, and Amlodipine   Social History:  The patient  reports that he quit smoking about 42 years ago. His smoking use included cigarettes. He has  never used smokeless tobacco. He reports that he does not drink alcohol and does not use drugs.   Family History:   family history includes Alcohol abuse in his father and mother; Asthma in his mother; Liver cancer in his paternal grandmother; Prostate cancer in his father.    Review of Systems: Review of Systems  Constitutional: Negative.   HENT: Negative.   Respiratory: Negative.   Cardiovascular: Negative.   Gastrointestinal: Negative.   Musculoskeletal: Negative.   Neurological: Negative.   Psychiatric/Behavioral: Negative.   All other systems reviewed and are negative.   PHYSICAL EXAM: VS:  BP 130/76 (BP Location: Left  Arm, Patient Position: Sitting, Cuff Size: Normal)   Pulse (!) 51   Ht 5\' 6"  (1.676 m)   Wt 166 lb (75.3 kg)   SpO2 98%   BMI 26.79 kg/m  , BMI Body mass index is 26.79 kg/m. Constitutional:  oriented to person, place, and time. No distress.  HENT:  Head: Grossly normal Eyes:  no discharge. No scleral icterus.  Neck: No JVD, no carotid bruits  Cardiovascular: Regular rate and rhythm, no murmurs appreciated Pulmonary/Chest: Clear to auscultation bilaterally, no wheezes or rails Abdominal: Soft.  no distension.  no tenderness.  Musculoskeletal: Normal range of motion Neurological:  normal muscle tone. Coordination normal. No atrophy Skin: Skin warm and dry Psychiatric: normal affect, pleasant   Recent Labs: 05/19/2020: ALT 16; BUN 12; Creatinine, Ser 0.87; Hemoglobin 13.9; Platelets 294.0; Potassium 4.6; Sodium 140; TSH 0.51    Lipid Panel Lab Results  Component Value Date   CHOL 125 02/25/2020   HDL 28.50 (L) 02/25/2020   LDLCALC 60 02/25/2020   TRIG 184.0 (H) 02/25/2020      Wt Readings from Last 3 Encounters:  01/31/21 166 lb (75.3 kg)  05/19/20 165 lb (74.8 kg)  01/25/20 156 lb (70.8 kg)     ASSESSMENT AND PLAN:  Bicuspid aortic valve -  Aortic valve insufficiency, Bioprosthetic valve  Echo pending  Cardiomyopathy In setting of cardiac arrest post surgery, low ejection fraction at that time Repeat echo ordered  Ascending aorta dilatation ([place]) - Plan: EKG 12- Bentall aortic root replacement Echo ordered  CAD with stable angina Currently with no symptoms of angina. No further workup at this time. Continue current medication regimen. Prior stress test low risk  Paroxysmal atrial fibrillation  Maintaining normal sinus rhythm   Total encounter time more than 25 minutes  Greater than 50% was spent in counseling and coordination of care with the patient   Orders Placed This Encounter  Procedures  . EKG 12-Lead     Signed, Esmond Plants, M.D.,  Ph.D. 01/31/2021  [place], [facility]

## 2021-01-31 ENCOUNTER — Encounter: Payer: Self-pay | Admitting: Cardiovascular Disease

## 2021-01-31 ENCOUNTER — Other Ambulatory Visit: Payer: Self-pay

## 2021-01-31 ENCOUNTER — Ambulatory Visit (INDEPENDENT_AMBULATORY_CARE_PROVIDER_SITE_OTHER): Payer: Medicare Other | Admitting: Cardiovascular Disease

## 2021-01-31 VITALS — BP 130/76 | HR 51 | Ht 66.0 in | Wt 166.0 lb

## 2021-01-31 DIAGNOSIS — I2510 Atherosclerotic heart disease of native coronary artery without angina pectoris: Secondary | ICD-10-CM | POA: Diagnosis not present

## 2021-01-31 DIAGNOSIS — I480 Paroxysmal atrial fibrillation: Secondary | ICD-10-CM

## 2021-01-31 DIAGNOSIS — Z951 Presence of aortocoronary bypass graft: Secondary | ICD-10-CM | POA: Diagnosis not present

## 2021-01-31 DIAGNOSIS — I712 Thoracic aortic aneurysm, without rupture, unspecified: Secondary | ICD-10-CM

## 2021-01-31 DIAGNOSIS — I724 Aneurysm of artery of lower extremity: Secondary | ICD-10-CM

## 2021-01-31 DIAGNOSIS — Z953 Presence of xenogenic heart valve: Secondary | ICD-10-CM

## 2021-01-31 DIAGNOSIS — Q231 Congenital insufficiency of aortic valve: Secondary | ICD-10-CM

## 2021-01-31 DIAGNOSIS — I25118 Atherosclerotic heart disease of native coronary artery with other forms of angina pectoris: Secondary | ICD-10-CM | POA: Diagnosis not present

## 2021-01-31 DIAGNOSIS — I351 Nonrheumatic aortic (valve) insufficiency: Secondary | ICD-10-CM

## 2021-01-31 DIAGNOSIS — I77810 Thoracic aortic ectasia: Secondary | ICD-10-CM

## 2021-01-31 DIAGNOSIS — I428 Other cardiomyopathies: Secondary | ICD-10-CM

## 2021-01-31 NOTE — Patient Instructions (Addendum)
Medication Instructions:  No changes  Lab work: No new labs needed  Testing/Procedures: Echo for CAD, CABG, cardiomyopathy  Your physician has requested that you have an echocardiogram. Echocardiography is a painless test that uses sound waves to create images of your heart. It provides your doctor with information about the size and shape of your heart and how well your heart's chambers and valves are working. This procedure takes approximately one hour. There are no restrictions for this procedure.  There is a possibility that an IV may need to be started during your test to inject an image enhancing agent. This is done to obtain more optimal pictures of your heart. Therefore we ask that you do at least drink some water prior to coming in to hydrate your veins.     Follow-Up:  . You will need a follow up appointment in 12 months  . Providers on your designated Care Team:   . Murray Hodgkins, NP . Christell Faith, PA-C . Marrianne Mood, PA-C  Any Other Special Instructions Will Be Listed Below (If Applicable).  COVID-19 Vaccine Information can be found at: [email or web address].co.uk For questions related to vaccine distribution or appointments, please email [email or web address] or call [phone].

## 2021-02-14 ENCOUNTER — Other Ambulatory Visit: Payer: Self-pay

## 2021-02-14 ENCOUNTER — Emergency Department (HOSPITAL_BASED_OUTPATIENT_CLINIC_OR_DEPARTMENT_OTHER): Payer: Medicare Other

## 2021-02-14 ENCOUNTER — Observation Stay (HOSPITAL_BASED_OUTPATIENT_CLINIC_OR_DEPARTMENT_OTHER)
Admission: EM | Admit: 2021-02-14 | Discharge: 2021-02-16 | Disposition: A | Discharge status: Home / Self Care | Payer: Medicare Other | Attending: Internal Medicine | Admitting: Internal Medicine

## 2021-02-14 ENCOUNTER — Encounter (HOSPITAL_BASED_OUTPATIENT_CLINIC_OR_DEPARTMENT_OTHER): Payer: Self-pay | Admitting: *Deleted

## 2021-02-14 DIAGNOSIS — R29810 Facial weakness: Secondary | ICD-10-CM | POA: Insufficient documentation

## 2021-02-14 DIAGNOSIS — D329 Benign neoplasm of meninges, unspecified: Secondary | ICD-10-CM | POA: Diagnosis not present

## 2021-02-14 DIAGNOSIS — Z954 Presence of other heart-valve replacement: Secondary | ICD-10-CM | POA: Diagnosis not present

## 2021-02-14 DIAGNOSIS — R2689 Other abnormalities of gait and mobility: Secondary | ICD-10-CM | POA: Insufficient documentation

## 2021-02-14 DIAGNOSIS — I25810 Atherosclerosis of coronary artery bypass graft(s) without angina pectoris: Secondary | ICD-10-CM | POA: Insufficient documentation

## 2021-02-14 DIAGNOSIS — I10 Essential (primary) hypertension: Secondary | ICD-10-CM | POA: Diagnosis not present

## 2021-02-14 DIAGNOSIS — G459 Transient cerebral ischemic attack, unspecified: Principal | ICD-10-CM | POA: Diagnosis present

## 2021-02-14 DIAGNOSIS — Z87891 Personal history of nicotine dependence: Secondary | ICD-10-CM | POA: Insufficient documentation

## 2021-02-14 DIAGNOSIS — K219 Gastro-esophageal reflux disease without esophagitis: Secondary | ICD-10-CM | POA: Diagnosis not present

## 2021-02-14 DIAGNOSIS — I2510 Atherosclerotic heart disease of native coronary artery without angina pectoris: Secondary | ICD-10-CM | POA: Diagnosis present

## 2021-02-14 DIAGNOSIS — Z79899 Other long term (current) drug therapy: Secondary | ICD-10-CM | POA: Diagnosis not present

## 2021-02-14 DIAGNOSIS — Z7951 Long term (current) use of inhaled steroids: Secondary | ICD-10-CM | POA: Diagnosis not present

## 2021-02-14 DIAGNOSIS — Z20822 Contact with and (suspected) exposure to covid-19: Secondary | ICD-10-CM | POA: Insufficient documentation

## 2021-02-14 DIAGNOSIS — J4520 Mild intermittent asthma, uncomplicated: Secondary | ICD-10-CM | POA: Diagnosis present

## 2021-02-14 DIAGNOSIS — E039 Hypothyroidism, unspecified: Secondary | ICD-10-CM | POA: Diagnosis not present

## 2021-02-14 DIAGNOSIS — H538 Other visual disturbances: Secondary | ICD-10-CM | POA: Diagnosis present

## 2021-02-14 DIAGNOSIS — R41841 Cognitive communication deficit: Secondary | ICD-10-CM | POA: Diagnosis not present

## 2021-02-14 DIAGNOSIS — E785 Hyperlipidemia, unspecified: Secondary | ICD-10-CM | POA: Insufficient documentation

## 2021-02-14 DIAGNOSIS — Z953 Presence of xenogenic heart valve: Secondary | ICD-10-CM

## 2021-02-14 NOTE — ED Notes (Signed)
Pt placed in a gown and hooked up to the monitor with the 5 lead, BP cuff and pulse ox 

## 2021-02-14 NOTE — ED Provider Notes (Signed)
[facility]ERGENCY DEPARTMENT Provider Note   CSN: [id number] Arrival date & time: 02/14/21  2142     History Chief Complaint  Patient presents with  . Hypertension    Jeffrey Tucker is a 65 y.o. male.  HPI     This is a 65 year old male with extensive cardiac history including aortic valve repair, CABG, subsequent pericardial tamponade who presents with an episode of blurry vision and facial numbness.  Patient reports that around 7:30 PM he had acute onset of blurry vision.  He believes that it was both eyes.  He describes not being able to focus.  He also had some numbness of the right side of his lip.  He describes it as similar to having dental work done.  He states his symptoms lasted approximately 3 minutes and self resolved.  Did not have any headache at that time.  Denies facial droop, speech difficulty, weakness, numbness, dizziness.  He takes a baby aspirin daily but no other anticoagulants.  He states he did take his blood pressure and noted it to be high with a systolic greater than 782.  He states this is significantly abnormal for him.  He reports that he is now back to his baseline.  Denies any recent illnesses or fevers.  Past Medical History:  Diagnosis Date  . Allergy    allergic rhinitis  . Aortic root dilatation ([place])    a. s/p surgical repair 08/27/2019  . Asthma    mostly in childhood  . Bicuspid aortic valve    a.  Status post bioprosthetic replacement 08/27/2019  . BPH (benign prostatic hypertrophy)   . CAD (coronary artery disease)    a. s/p 2-v CABG 08/2019 (SVG-OM, SVG--rPDA) *Grafts subsequently ligated due to bleeding  . GERD (gastroesophageal reflux disease)   . History of kidney stones   . Hypertension   . Hypertriglyceridemia   . Hypothyroidism   . Pericardial tamponade 09/25/2019  . Severe aortic regurgitation    a. bicuspid aortic valve, b. s/p bio Bentall procedure 08/2019  . Wears glasses     Patient Active Problem List    Diagnosis Date Noted  . Coronary artery disease of native artery of native heart with stable angina pectoris ([place]) 12/05/2019  . Thoracic aortic aneurysm without rupture ([place]) 12/05/2019  . Acute blood loss anemia   . Acute on chronic diastolic heart failure ([place]) 09/25/2019  . Coronary atherosclerosis of native coronary artery   . Anemia   . S/P aortic valve replacement with bioprosthetic valve 08/27/2019  . Seborrheic dermatitis of scalp 06/17/2019  . Arthralgia 04/01/2019  . Preventative health care 12/26/2017  . Allergic asthma 09/09/2017  . Essential hypertension, benign 02/28/2017  . Advance care planning 09/30/2016  . Mild intermittent asthma 08/21/2016  . Hypertriglyceridemia   . Benign prostatic hyperplasia   . Ascending aorta dilatation ([facility]) 08/21/2013  . Aortic valve regurgitation 07/20/2011  . Bicuspid aortic valve 07/02/2011  . Mild intermittent asthma without complication 95/62/1308  . ALLERGIC RHINITIS 04/27/2009  . Hypothyroidism 04/14/2008    Past Surgical History:  Procedure Laterality Date  . BENTALL PROCEDURE N/A 08/27/2019   Procedure: BENTALL PROCEDURE with a 27 mm KONECT Resilia Aortic Valved Conduit and 30 mm x 30 cm Hemashield Platinum straight graft.;  Surgeon: Wonda Olds, MD;  Location: [facility];  Service: Open Heart Surgery;  Laterality: N/A;  . CARDIAC CATHETERIZATION  2006   [place]  . CARDIOVASCULAR STRESS TEST  2006   [place]  .  CHOLECYSTECTOMY  2006  . CORONARY ARTERY BYPASS GRAFT N/A 08/27/2019   Procedure: CORONARY ARTERY BYPASS GRAFTING (CABG) times 2 using right saphenous endoscopic vein harvesting to OM2 nad PDA.;  Surgeon: Wonda Olds, MD;  Location: [facility];  Service: Open Heart Surgery;  Laterality: N/A;  . CYSTOSCOPY W/ URETERAL STENT PLACEMENT  05/24/2017   Procedure: left;  Surgeon: Nickie Retort, MD;  Location: [facility];  Service: Urology;;  . Consuela Mimes W/ URETERAL STENT PLACEMENT Left 06/11/2017   Procedure: CYSTOSCOPY  WITH STENT REPLACEMENT;  Surgeon: Hollice Espy, MD;  Location: [facility];  Service: Urology;  Laterality: Left;  . CYSTOSCOPY WITH BIOPSY Left 06/11/2017   Procedure: RENAL PELVIS TUMOR WITH BIOPSY WITH POSSIBLE LASER ABLATION;  Surgeon: Hollice Espy, MD;  Location: [facility];  Service: Urology;  Laterality: Left;  . CYSTOSCOPY WITH STENT PLACEMENT Left 05/24/2017   Procedure: , cystoscopy,left ureteral stent placement and left ureteroscopy attempted, retrograde pylogram;  Surgeon: Nickie Retort, MD;  Location: [facility];  Service: Urology;  Laterality: Left;  . EYE SURGERY Bilateral    Cataract Extraction with IOL  . FALSE ANEURYSM REPAIR  10/29/2019   REPAIR FALSE ANEURYSM RIGHT FEMORAL   . FALSE ANEURYSM REPAIR Right 10/29/2019   Procedure: REPAIR FALSE ANEURYSM RIGHT FEMORAL;  Surgeon: Marty Heck, MD;  Location: [facility];  Service: Vascular;  Laterality: Right;  . HEMATOMA EVACUATION N/A 09/25/2019   Procedure: Evacuation Hematoma - Mediastinal Ligation of saphenous vein graft x two with Cardiopulmonary Bypass;  Surgeon: Rexene Alberts, MD;  Location: [facility];  Service: Thoracic;  Laterality: N/A;  . IR THORACENTESIS ASP PLEURAL SPACE W/IMG GUIDE  09/02/2019  . RIGHT/LEFT HEART CATH AND CORONARY ANGIOGRAPHY N/A 08/21/2019   Procedure: RIGHT/LEFT HEART CATH AND CORONARY ANGIOGRAPHY;  Surgeon: Minna Merritts, MD;  Location: [facility];  Service: Cardiovascular;  Laterality: N/A;  . STERNOTOMY N/A 09/25/2019   Procedure: REDO STERNOTOMY;  Surgeon: Rexene Alberts, MD;  Location: [facility];  Service: Thoracic;  Laterality: N/A;  . TEE WITHOUT CARDIOVERSION N/A 08/27/2019   Procedure: TRANSESOPHAGEAL ECHOCARDIOGRAM (TEE);  Surgeon: Wonda Olds, MD;  Location: [facility];  Service: Open Heart Surgery;  Laterality: N/A;  . TEE WITHOUT CARDIOVERSION N/A 09/25/2019   Procedure: Transesophageal Echocardiogram (Tee);  Surgeon: Rexene Alberts, MD;  Location: [facility] [facility];  Service:  Thoracic;  Laterality: N/A;  . URETEROSCOPY WITH HOLMIUM LASER LITHOTRIPSY Left 06/11/2017   Procedure: URETEROSCOPY WITH HOLMIUM LASER LITHOTRIPSY;  Surgeon: Hollice Espy, MD;  Location: [facility];  Service: Urology;  Laterality: Left;  Marland Kitchen VEIN LIGATION AND STRIPPING Bilateral 1997       Family History  Problem Relation Age of Onset  . Alcohol abuse Mother   . Asthma Mother   . Alcohol abuse Father   . Prostate cancer Father   . Liver cancer Paternal Grandmother   . Colon cancer Neg Hx   . Coronary artery disease Neg Hx   . Hypertension Neg Hx   . Diabetes Neg Hx   . Bladder Cancer Neg Hx   . Kidney cancer Neg Hx     Social History   Tobacco Use  . Smoking status: Former Smoker    Types: Cigarettes    Quit date: 11/26/1978    Years since quitting: 42.2  . Smokeless tobacco: Never Used  . Tobacco comment: social smoked till 1980's  Vaping Use  . Vaping Use: Never used  Substance Use Topics  . Alcohol use:  No    Alcohol/week: 0.0 standard drinks    Comment: none since 1984  . Drug use: No    Home Medications Prior to Admission medications   Medication Sig Start Date End Date Taking? Authorizing Provider  aspirin EC 81 MG EC tablet Take 1 tablet (81 mg total) by mouth daily. 09/25/19  Yes Swayze, Ava, DO  atorvastatin (LIPITOR) 40 MG tablet Take 1 tablet (40 mg total) by mouth daily. 05/19/20  Yes Venia Carbon, MD  Fluticasone-Salmeterol (ADVAIR) 100-50 MCG/DOSE AEPB Inhale 1 puff into the lungs 2 (two) times daily. 02/08/20  Yes Venia Carbon, MD  levocetirizine (XYZAL) 5 MG tablet Take 5 mg by mouth every evening.   Yes [provider]  levothyroxine (SYNTHROID) 150 MCG tablet Take 1 tablet (150 mcg total) by mouth daily. 03/07/20  Yes Venia Carbon, MD  albuterol (VENTOLIN HFA) 108 (90 Base) MCG/ACT inhaler Inhale 2 puffs into the lungs 3 (three) times daily as needed for wheezing or shortness of breath. 04/28/19   [provider]     Allergies    Tetracycline hcl, Beta adrenergic blockers, Metoprolol tartrate, and Amlodipine  Review of Systems   Review of Systems  Constitutional: Negative for fever.  Eyes: Positive for visual disturbance.  Respiratory: Negative for shortness of breath.   Cardiovascular: Negative for chest pain.  Gastrointestinal: Negative for abdominal pain, nausea and vomiting.  Neurological: Positive for numbness. Negative for dizziness, facial asymmetry, weakness and headaches.  All other systems reviewed and are negative.   Physical Exam Updated Vital Signs BP (!) 156/82 (BP Location: Left Arm)   Pulse (!) 48   Temp 98 F (36.7 C) (Oral)   Resp 14   Ht 1.676 m (5\' 6" )   Wt 75.3 kg   SpO2 100%   BMI 26.79 kg/m   Physical Exam Vitals and nursing note reviewed.  Constitutional:      Appearance: He is well-developed.  HENT:     Head: Normocephalic and atraumatic.     Nose: Nose normal.     Mouth/Throat:     Mouth: Mucous membranes are moist.  Eyes:     Pupils: Pupils are equal, round, and reactive to light.  Cardiovascular:     Rate and Rhythm: Normal rate and regular rhythm.     Heart sounds: Murmur heard.    Pulmonary:     Effort: Pulmonary effort is normal. No respiratory distress.     Breath sounds: Normal breath sounds. No wheezing.  Abdominal:     General: Bowel sounds are normal.     Palpations: Abdomen is soft.     Tenderness: There is no abdominal tenderness. There is no rebound.  Musculoskeletal:     Cervical back: Neck supple.  Lymphadenopathy:     Cervical: No cervical adenopathy.  Skin:    General: Skin is warm and dry.  Neurological:     Mental Status: He is alert and oriented to person, place, and time.     Comments: Cranial nerves II through XII intact, visual fields intact, 5 out of 5 strength in all 4 extremities, no dysmetria to finger-nose-finger, no drift  Psychiatric:        Mood and Affect: Mood normal.     ED Results / Procedures /  Treatments   Labs (all labs ordered are listed, but only abnormal results are displayed) Labs Reviewed  CBC - Abnormal; Notable for the following components:      Result Value   RBC  4.18 (*)    All other components within normal limits  COMPREHENSIVE METABOLIC PANEL - Abnormal; Notable for the following components:   Calcium 8.7 (*)    All other components within normal limits  RESP PANEL BY RT-PCR (FLU A&B, COVID) ARPGX2  ETHANOL  PROTIME-INR  APTT  DIFFERENTIAL  RAPID URINE DRUG SCREEN, HOSP PERFORMED  URINALYSIS, ROUTINE W REFLEX MICROSCOPIC    EKG EKG Interpretation  Date/Time:  Tuesday February 14 2021 21:56:51 EDT Ventricular Rate:  55 PR Interval:  208 QRS Duration: 102 QT Interval:  424 QTC Calculation: 405 R Axis:   22 Text Interpretation: Sinus bradycardia Otherwise normal ECG Confirmed by Thayer Jew [phone]) on 02/14/2021 11:22:59 PM   Radiology CT HEAD WO CONTRAST  Result Date: 02/14/2021 CLINICAL DATA:  Sudden onset blurred vision and lip numbness on right side since resolved. Elevated blood pressure. Hx HTN. EXAM: CT HEAD WITHOUT CONTRAST TECHNIQUE: Contiguous axial images were obtained from the base of the skull through the vertex without intravenous contrast. COMPARISON:  None. FINDINGS: Brain: No evidence of large-territorial acute infarction. No parenchymal hemorrhage. No mass lesion. No extra-axial collection. No mass effect or midline shift. No hydrocephalus. Basilar cisterns are patent. Vascular: No hyperdense vessel. Atherosclerotic calcifications are present within the cavernous internal carotid arteries. Skull: No acute fracture or focal lesion. Old right lamina papyracea fracture. Sinuses/Orbits: Mucosal thickening of bilateral ethmoid, maxillary, frontal, sphenoid sinuses. The mastoid air cells are clear. Bilateral lens replacement. Otherwise the orbits are unremarkable. Other: None. IMPRESSION: 1. No acute intracranial abnormality. 2. Mucosal thickening  of all the paranasal sinuses. Electronically Signed   By: Iven Finn M.D.   On: 02/14/2021 23:43    Procedures Procedures   Medications Ordered in ED Medications - No data to display  ED Course  I have reviewed the triage vital signs and the nursing notes.  Pertinent labs & imaging results that were available during my care of the patient were reviewed by me and considered in my medical decision making (see chart for details).  Clinical Course as of 02/15/21 0149  Tue Feb 14, 2021  2351 Requested nonemergent MD to MD neurology consult via teleneurology.  Was informed that because the patient has not been evaluated previously, he will need a teleneuro assessment.  This is reasonable.  Primary question is whether or not patient needs inpatient versus outpatient TIA work-up.  ABCD 2 score is 2. [CH]    Clinical Course User Index [CH] Riki Gehring, Barbette Hair, MD   MDM Rules/Calculators/A&P                          Patient presents with self-limited episode of visual changes and lip numbness.  He is nontoxic on my exam.  Initial vital signs notable for a blood pressure 162/81.  His history does sound concerning for possible TIA although his neurologic exam now is normal.  Technically has an ABCD 2 score of 2.  He takes a baby aspirin daily.  Stroke work-up initiated.  See clinical course above.  He was evaluated by teleneurology.  Awaiting teleneurology to write their note but per communication with nursing, recommended he be admitted for full TIA work-up.  CT scan reviewed without obvious bleed.  Labs reviewed and showed no significant metabolic derangements or other abnormality.  Covid testing is pending  Final Clinical Impression(s) / ED Diagnoses Final diagnoses:  TIA (transient ischemic attack)    Rx / DC Orders ED Discharge Orders  None       Merryl Hacker, MD 02/15/21 [phone]

## 2021-02-14 NOTE — ED Notes (Signed)
All incisions closed and healed at this time

## 2021-02-14 NOTE — ED Triage Notes (Addendum)
Sudden onset of blurred vision 2 hours ago. His lip became numb on the right side. It lasted 5 minutes and resolved without intervention. His BP was elevated 160/85. He has no numbness on arrival to ER.

## 2021-02-15 ENCOUNTER — Observation Stay (HOSPITAL_COMMUNITY): Payer: Medicare Other

## 2021-02-15 ENCOUNTER — Observation Stay (HOSPITAL_BASED_OUTPATIENT_CLINIC_OR_DEPARTMENT_OTHER): Payer: Medicare Other

## 2021-02-15 DIAGNOSIS — R29810 Facial weakness: Secondary | ICD-10-CM | POA: Diagnosis not present

## 2021-02-15 DIAGNOSIS — Z87891 Personal history of nicotine dependence: Secondary | ICD-10-CM | POA: Diagnosis not present

## 2021-02-15 DIAGNOSIS — K219 Gastro-esophageal reflux disease without esophagitis: Secondary | ICD-10-CM | POA: Diagnosis not present

## 2021-02-15 DIAGNOSIS — R2689 Other abnormalities of gait and mobility: Secondary | ICD-10-CM | POA: Diagnosis not present

## 2021-02-15 DIAGNOSIS — E039 Hypothyroidism, unspecified: Secondary | ICD-10-CM

## 2021-02-15 DIAGNOSIS — Z79899 Other long term (current) drug therapy: Secondary | ICD-10-CM | POA: Diagnosis not present

## 2021-02-15 DIAGNOSIS — I25810 Atherosclerosis of coronary artery bypass graft(s) without angina pectoris: Secondary | ICD-10-CM | POA: Diagnosis not present

## 2021-02-15 DIAGNOSIS — R41841 Cognitive communication deficit: Secondary | ICD-10-CM | POA: Diagnosis not present

## 2021-02-15 DIAGNOSIS — I10 Essential (primary) hypertension: Secondary | ICD-10-CM

## 2021-02-15 DIAGNOSIS — H538 Other visual disturbances: Secondary | ICD-10-CM | POA: Diagnosis present

## 2021-02-15 DIAGNOSIS — G459 Transient cerebral ischemic attack, unspecified: Secondary | ICD-10-CM | POA: Diagnosis present

## 2021-02-15 DIAGNOSIS — Z953 Presence of xenogenic heart valve: Secondary | ICD-10-CM

## 2021-02-15 DIAGNOSIS — E785 Hyperlipidemia, unspecified: Secondary | ICD-10-CM | POA: Diagnosis not present

## 2021-02-15 DIAGNOSIS — I2510 Atherosclerotic heart disease of native coronary artery without angina pectoris: Secondary | ICD-10-CM

## 2021-02-15 DIAGNOSIS — Z954 Presence of other heart-valve replacement: Secondary | ICD-10-CM | POA: Diagnosis not present

## 2021-02-15 DIAGNOSIS — D329 Benign neoplasm of meninges, unspecified: Secondary | ICD-10-CM | POA: Diagnosis not present

## 2021-02-15 DIAGNOSIS — J4520 Mild intermittent asthma, uncomplicated: Secondary | ICD-10-CM

## 2021-02-15 DIAGNOSIS — Z7951 Long term (current) use of inhaled steroids: Secondary | ICD-10-CM | POA: Diagnosis not present

## 2021-02-15 DIAGNOSIS — Z20822 Contact with and (suspected) exposure to covid-19: Secondary | ICD-10-CM | POA: Diagnosis not present

## 2021-02-15 LAB — URINALYSIS, ROUTINE W REFLEX MICROSCOPIC
Bilirubin Urine: NEGATIVE
Glucose, UA: NEGATIVE mg/dL
Hgb urine dipstick: NEGATIVE
Ketones, ur: NEGATIVE mg/dL
Leukocytes,Ua: NEGATIVE
Nitrite: NEGATIVE
Protein, ur: NEGATIVE mg/dL
Specific Gravity, Urine: 1.025 (ref 1.005–1.030)
pH: 6 (ref 5.0–8.0)

## 2021-02-15 LAB — CBC
HCT: 39 % (ref 39.0–52.0)
Hemoglobin: 13.3 g/dL (ref 13.0–17.0)
MCH: 31.8 pg (ref 26.0–34.0)
MCHC: 34.1 g/dL (ref 30.0–36.0)
MCV: 93.3 fL (ref 80.0–100.0)
Platelets: 242 10*3/uL (ref 150–400)
RBC: 4.18 MIL/uL — ABNORMAL LOW (ref 4.22–5.81)
RDW: 13 % (ref 11.5–15.5)
WBC: 6.8 10*3/uL (ref 4.0–10.5)
nRBC: 0 % (ref 0.0–0.2)

## 2021-02-15 LAB — PROTIME-INR
INR: 1.1 (ref 0.8–1.2)
Prothrombin Time: 13.5 seconds (ref 11.4–15.2)

## 2021-02-15 LAB — COMPREHENSIVE METABOLIC PANEL
ALT: 21 U/L (ref 0–44)
AST: 23 U/L (ref 15–41)
Albumin: 4 g/dL (ref 3.5–5.0)
Alkaline Phosphatase: 101 U/L (ref 38–126)
Anion gap: 7 (ref 5–15)
BUN: 16 mg/dL (ref 8–23)
CO2: 27 mmol/L (ref 22–32)
Calcium: 8.7 mg/dL — ABNORMAL LOW (ref 8.9–10.3)
Chloride: 103 mmol/L (ref 98–111)
Creatinine, Ser: 0.66 mg/dL (ref 0.61–1.24)
GFR, Estimated: >60 mL/min (ref >60)
Glucose, Bld: 93 mg/dL (ref 70–99)
Potassium: 3.5 mmol/L (ref 3.5–5.1)
Sodium: 137 mmol/L (ref 135–145)
Total Bilirubin: 0.3 mg/dL (ref 0.3–1.2)
Total Protein: 7 g/dL (ref 6.5–8.1)

## 2021-02-15 LAB — ECHOCARDIOGRAM COMPLETE
AV Mean grad: 9 mmHg
AV Peak grad: 14.9 mmHg
Ao pk vel: 1.93 m/s
Area-P 1/2: 2.16 cm2
Height: 66 in
S' Lateral: 2.5 cm
Weight: 2649.05 oz

## 2021-02-15 LAB — APTT: aPTT: 31 seconds (ref 24–36)

## 2021-02-15 LAB — DIFFERENTIAL
Abs Immature Granulocytes: 0.01 10*3/uL (ref 0.00–0.07)
Basophils Absolute: 0.1 10*3/uL (ref 0.0–0.1)
Basophils Relative: 1 %
Eosinophils Absolute: 0.3 10*3/uL (ref 0.0–0.5)
Eosinophils Relative: 4 %
Immature Granulocytes: 0 %
Lymphocytes Relative: 40 %
Lymphs Abs: 2.7 10*3/uL (ref 0.7–4.0)
Monocytes Absolute: 0.6 10*3/uL (ref 0.1–1.0)
Monocytes Relative: 9 %
Neutro Abs: 3.1 10*3/uL (ref 1.7–7.7)
Neutrophils Relative %: 46 %

## 2021-02-15 LAB — HEMOGLOBIN A1C
Hgb A1c MFr Bld: 5.7 % — ABNORMAL HIGH (ref 4.8–5.6)
Mean Plasma Glucose: 116.89 mg/dL

## 2021-02-15 LAB — RAPID URINE DRUG SCREEN, HOSP PERFORMED
Amphetamines: NOT DETECTED
Barbiturates: NOT DETECTED
Benzodiazepines: NOT DETECTED
Cocaine: NOT DETECTED
Opiates: NOT DETECTED
Tetrahydrocannabinol: NOT DETECTED

## 2021-02-15 LAB — RESP PANEL BY RT-PCR (FLU A&B, COVID) ARPGX2
Influenza A by PCR: NEGATIVE
Influenza B by PCR: NEGATIVE
SARS Coronavirus 2 by RT PCR: NEGATIVE

## 2021-02-15 LAB — HIV ANTIBODY (ROUTINE TESTING W REFLEX): HIV Screen 4th Generation wRfx: NONREACTIVE

## 2021-02-15 LAB — ETHANOL: Alcohol, Ethyl (B): <10 mg/dL (ref <10)

## 2021-02-15 LAB — TSH: TSH: 0.183 u[IU]/mL — ABNORMAL LOW (ref 0.350–4.500)

## 2021-02-15 MED ORDER — STROKE: EARLY STAGES OF RECOVERY BOOK
Freq: Once | Status: AC
  Filled 2021-02-15: qty 1

## 2021-02-15 MED ORDER — ATORVASTATIN CALCIUM 40 MG PO TABS
40.0000 mg | ORAL_TABLET | Freq: Every day | ORAL | Status: DC
  Administered 2021-02-15 – 2021-02-16 (×2): 40 mg via ORAL
  Filled 2021-02-15 (×2): qty 1

## 2021-02-15 MED ORDER — SODIUM CHLORIDE 0.9 % IV SOLN
1.0000 g | Freq: Once | INTRAVENOUS | Status: AC
  Administered 2021-02-15: 1 g via INTRAVENOUS
  Filled 2021-02-15: qty 10

## 2021-02-15 MED ORDER — IOHEXOL 350 MG/ML SOLN
75.0000 mL | Freq: Once | INTRAVENOUS | Status: AC | PRN
  Administered 2021-02-15: 75 mL via INTRAVENOUS

## 2021-02-15 MED ORDER — LEVOTHYROXINE SODIUM 75 MCG PO TABS
150.0000 ug | ORAL_TABLET | Freq: Every day | ORAL | Status: DC
  Administered 2021-02-15 – 2021-02-16 (×2): 150 ug via ORAL
  Filled 2021-02-15 (×2): qty 2

## 2021-02-15 MED ORDER — ASPIRIN EC 81 MG PO TBEC
81.0000 mg | DELAYED_RELEASE_TABLET | Freq: Every day | ORAL | Status: DC
  Administered 2021-02-15 – 2021-02-16 (×2): 81 mg via ORAL
  Filled 2021-02-15 (×2): qty 1

## 2021-02-15 MED ORDER — ACETAMINOPHEN 650 MG RE SUPP: 650.0000 mg | RECTAL | Status: DC | PRN

## 2021-02-15 MED ORDER — CLOPIDOGREL BISULFATE 75 MG PO TABS
75.0000 mg | ORAL_TABLET | Freq: Every day | ORAL | Status: DC
  Administered 2021-02-16: 75 mg via ORAL
  Filled 2021-02-15 (×2): qty 1

## 2021-02-15 MED ORDER — CETIRIZINE HCL 10 MG PO TABS
10.0000 mg | ORAL_TABLET | Freq: Every evening | ORAL | Status: DC
  Administered 2021-02-15: 10 mg via ORAL
  Filled 2021-02-15 (×2): qty 1

## 2021-02-15 MED ORDER — ENOXAPARIN SODIUM 40 MG/0.4ML ~~LOC~~ SOLN
40.0000 mg | SUBCUTANEOUS | Status: DC
  Administered 2021-02-15 – 2021-02-16 (×2): 40 mg via SUBCUTANEOUS
  Filled 2021-02-15 (×2): qty 0.4

## 2021-02-15 MED ORDER — ACETAMINOPHEN 325 MG PO TABS: 650.0000 mg | ORAL_TABLET | ORAL | Status: DC | PRN

## 2021-02-15 MED ORDER — MOMETASONE FURO-FORMOTEROL FUM 100-5 MCG/ACT IN AERO
2.0000 | INHALATION_SPRAY | Freq: Two times a day (BID) | RESPIRATORY_TRACT | Status: DC
  Administered 2021-02-15 – 2021-02-16 (×2): 2 via RESPIRATORY_TRACT
  Filled 2021-02-15: qty 8.8

## 2021-02-15 MED ORDER — ACETAMINOPHEN 160 MG/5ML PO SOLN: 650.0000 mg | ORAL | Status: DC | PRN

## 2021-02-15 NOTE — Consult Note (Signed)
Neurology Consultation - Neuro hospitalist team  Reason for Consult: TIA Referring Physician: Dr. Tamala Julian, Hospitalist  CC: Blurred vision and numbness  History is obtained from: Chart review, patient  HPI: Jeffrey Tucker is a 65 y.o. male with a past medical history of coronary artery disease status post two-vessel CABG in 2020, hypertension, hypothyroidism, severe aortic regurgitation status post Bentall procedure, hyperlipidemia, bicuspid aortic valve status post bioprosthetic valve replacement in 09/15/2019, presenting to the emergency room for evaluation of an episode of numbness around his mouth and blurred vision. He reports that the symptoms lasted about 30 seconds.  He was in his usual state of health around 7:30 PM last night when he had a sudden onset of numbness around his lips-sensation of Novocain-like feeling and blurred vision-could not see anything out of both eyes that lasted about 30 seconds and then started to become better and got better within a few minutes.  He has never had the symptoms before.  No history of stroke in the past.  No headache before or after the symptoms.  No history of migraine. Has an extensive cardiac history as above. Came into the hospital and was admitted for TIA work-up.  Neurological consultation obtained for TIA work-up.  Denies any chest pain.  Denies shortness of breath.  Denies any preceding fevers chills.  Denies any shortness of breath.  Denies any abdominal pain nausea vomiting.  Denies any bleeding or clotting tendencies. Not on any anticoagulants Takes a baby aspirin.   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  ROS: Performed and negative except as noted in HPI  Past Medical History:  Diagnosis Date  . Allergy    allergic rhinitis  . Aortic root dilatation ([place])    a. s/p  surgical repair 08/27/2019  . Asthma    mostly in childhood  . Bicuspid aortic valve    a.  Status post bioprosthetic replacement 08/27/2019  . BPH (benign prostatic hypertrophy)   . CAD (coronary artery disease)    a. s/p 2-v CABG 08/2019 (SVG-OM, SVG--rPDA) *Grafts subsequently ligated due to bleeding  . GERD (gastroesophageal reflux disease)   . History of kidney stones   . Hypertension   . Hypertriglyceridemia   . Hypothyroidism   . Pericardial tamponade 09/25/2019  . Severe aortic regurgitation    a. bicuspid aortic valve, b. s/p bio Bentall procedure 08/2019  . Wears glasses     Family History  Problem Relation Age of Onset  . Alcohol abuse Mother   . Asthma Mother   . Alcohol abuse Father   . Prostate cancer Father   . Liver cancer Paternal Grandmother   . Colon cancer Neg Hx   . Coronary artery disease Neg Hx   . Hypertension Neg Hx   . Diabetes Neg Hx   . Bladder Cancer Neg Hx   . Kidney cancer Neg Hx    Social History:   reports that he quit smoking about 42 years ago. His smoking use included cigarettes. He has never used smokeless tobacco. He reports that he does not drink alcohol and does not use drugs.  Medications  Current Facility-Administered Medications:  .  acetaminophen (TYLENOL) tablet 650 mg, 650 mg, Oral, Q4H PRN **OR** acetaminophen (TYLENOL) 160 MG/5ML solution 650 mg, 650 mg, Per Tube, Q4H PRN **OR** acetaminophen (TYLENOL) suppository 650 mg, 650 mg, Rectal, Q4H PRN, Tamala Julian,  Rondell A, MD .  aspirin EC tablet 81 mg, 81 mg, Oral, Daily, Smith, Rondell A, MD, 81 mg at 02/15/21 1225 .  atorvastatin (LIPITOR) tablet 40 mg, 40 mg, Oral, Daily, Smith, Rondell A, MD, 40 mg at 02/15/21 1225 .  cetirizine (ZYRTEC) tablet 10 mg, 10 mg, Oral, QPM, Smith, Rondell A, MD, 10 mg at 02/15/21 1930 .  clopidogrel (PLAVIX) tablet 75 mg, 75 mg, Oral, Daily, Smith, Rondell A, MD .  enoxaparin (LOVENOX) injection 40 mg, 40 mg, Subcutaneous, Q24H, Smith, Rondell A, MD, 40  mg at 02/15/21 1226 .  levothyroxine (SYNTHROID) tablet 150 mcg, 150 mcg, Oral, Q0600, Fuller Plan A, MD, 150 mcg at 02/15/21 1225 .  mometasone-formoterol (DULERA) 100-5 MCG/ACT inhaler 2 puff, 2 puff, Inhalation, BID, Fuller Plan A, MD, 2 puff at 02/15/21 1931   Exam: Current vital signs: BP (!) 144/82 (BP Location: Left Arm)   Pulse (!) 59   Temp 98.1 F (36.7 C) (Oral)   Resp 15   Ht 5\' 6"  (1.676 m)   Wt 75.1 kg   SpO2 100%   BMI 26.72 kg/m  Vital signs in last 24 hours: Temp:  [97.9 F (36.6 C)-98.2 F (36.8 C)] 98.1 F (36.7 C) (03/23 1616) Pulse Rate:  [47-67] 59 (03/23 1616) Resp:  [12-20] 15 (03/23 1616) BP: (114-162)/(71-84) 144/82 (03/23 1616) SpO2:  [94 %-100 %] 100 % (03/23 1616) Weight:  [75.1 kg-75.3 kg] 75.1 kg (03/23 0620)  GENERAL: Awake, alert in NAD HEENT: - Normocephalic and atraumatic, dry mm, no LN++, no Thyromegally LUNGS - Clear to auscultation bilaterally with no wheezes CV - S1S2 RRR, no m/r/g, equal pulses bilaterally. ABDOMEN - Soft, nontender, nondistended with normoactive BS Ext: warm, well perfused, intact peripheral pulses, no edema  NEURO:  Mental Status: AA&Ox3  Language: speech is nondysarthric naming, repetition, fluency, and comprehension intact. Cranial Nerves: PERRL EOMI, visual fields full, no facial asymmetry, facial sensation intact, hearing intact, tongue/uvula/soft palate midline, normal sternocleidomastoid and trapezius muscle strength. No evidence of tongue atrophy or fibrillations Motor: 5/5 without any drift in all fours Tone: is normal and bulk is normal Sensation- Intact to light touch bilaterally Coordination: FTN intact bilaterally, no ataxia in BLE. Gait- deferred NIH stroke scale 0  Labs I have reviewed labs in epic and the results pertinent to this consultation are:  CBC    Component Value Date/Time   WBC 6.8 02/14/2021 2347   RBC 4.18 (L) 02/14/2021 2347   HGB 13.3 02/14/2021 2347   HCT 39.0  02/14/2021 2347   PLT 242 02/14/2021 2347   MCV 93.3 02/14/2021 2347   MCH 31.8 02/14/2021 2347   MCHC 34.1 02/14/2021 2347   RDW 13.0 02/14/2021 2347   LYMPHSABS 2.7 02/14/2021 2347   MONOABS 0.6 02/14/2021 2347   EOSABS 0.3 02/14/2021 2347   BASOSABS 0.1 02/14/2021 2347    CMP     Component Value Date/Time   NA 137 02/14/2021 2347   K 3.5 02/14/2021 2347   CL 103 02/14/2021 2347   CO2 27 02/14/2021 2347   GLUCOSE 93 02/14/2021 2347   BUN 16 02/14/2021 2347   CREATININE 0.66 02/14/2021 2347   CREATININE 0.81 02/01/2017 1644   CALCIUM 8.7 (L) 02/14/2021 2347   PROT 7.0 02/14/2021 2347   ALBUMIN 4.0 02/14/2021 2347   AST 23 02/14/2021 2347   ALT 21 02/14/2021 2347   ALKPHOS 101 02/14/2021 2347   BILITOT 0.3 02/14/2021 2347   GFRNONAA >60 02/14/2021 2347   GFRAA >  60 10/30/2019 0414    Lipid Panel     Component Value Date/Time   CHOL 125 02/25/2020 0943   TRIG 184.0 (H) 02/25/2020 0943   HDL 28.50 (L) 02/25/2020 0943   CHOLHDL 4 02/25/2020 0943   VLDL 36.8 02/25/2020 0943   LDLCALC 60 02/25/2020 0943   LDLDIRECT 107.0 04/02/2019 1003   Imaging I have reviewed the images obtained: CT head-no acute intracranial findings CTA head and neck-no large vessel occlusion or hemodynamically significant stenosis or evidence of dissection in the head or neck. Apparent compression of the left brachiocephalic vein between the ascending aorta and the sternum. Diffuse paranasal sinus disease.  MRI examination of the brain-no acute stroke.  Multiple areas of chronic microhemorrhage in the brain, 1 cm calcific dural based mass in the right parietal lobe posteriorly likely a meningioma.   2D echocardiogram LVEF 60 to 65% with no regional wall motion abnormalities.  Moderate left ventricular hypertrophy.  Right ventricular systolic function moderately reduced.  There is a 27 mm valve present in the aortic position.  Aortic valve regurgitation is trivial.  Mitral valve is normal.  LA  size is normal  Assessment:  65 year old with above past medical history presenting for brief and transient episode of facial/lip numbness and blurred vision with complete resolution of symptoms. Given his extensive cardiac history, a TIA work-up is not unreasonable.  Impression: Evaluate for TIA  Recommendations: Dual antiplatelets-aspirin 81 and Plavix 75 for 3 weeks followed by aspirin only. A1c Lipid panel-goal LDL less than 70.  Continue home statin. Vessel imaging studies of been completed PT OT ST evaluation Cardiac monitoring May need outpatient 30-day cardiac monitoring.  Stroke team to follow.  -- Amie Portland, MD Neurologist [facility] Pager: [phone]

## 2021-02-15 NOTE — Progress Notes (Signed)
Wait to give plavix after neuro see patient - per primary team

## 2021-02-15 NOTE — Progress Notes (Signed)
OT Cancellation Note  Patient Details Name: Jeffrey Tucker MRN: [id number] DOB: [birth year]   Cancelled Treatment:    Reason Eval/Treat Not Completed: OT screened, no needs identified, will sign off. Discussed with PT. Pt feels symptoms have resolved. MRI (-).  [place], OT/L   Acute OT Clinical Specialist Acute Rehabilitation Services Pager [phone] Office [phone]  02/15/2021, 2:47 PM

## 2021-02-15 NOTE — ED Notes (Signed)
Report given to Musician at [facility].

## 2021-02-15 NOTE — ED Notes (Signed)
Tele neuro cart in room

## 2021-02-15 NOTE — Evaluation (Signed)
Speech Language Pathology Evaluation Patient Details Name: Jeffrey Tucker MRN: [id number] DOB: Jan 28, 1956 Today's Date: 02/15/2021 Time: 2694-8546 SLP Time Calculation (min) (ACUTE ONLY): 15.88 min  Problem List:  Patient Active Problem List   Diagnosis Date Noted  . TIA (transient ischemic attack) 02/15/2021  . Coronary artery disease of native artery of native heart with stable angina pectoris ([place]) 12/05/2019  . Thoracic aortic aneurysm without rupture ([place]) 12/05/2019  . Acute blood loss anemia   . Acute on chronic diastolic heart failure ([place]) 09/25/2019  . Coronary atherosclerosis of native coronary artery   . Anemia   . S/P aortic valve replacement with bioprosthetic valve 08/27/2019  . Seborrheic dermatitis of scalp 06/17/2019  . Arthralgia 04/01/2019  . Preventative health care 12/26/2017  . Allergic asthma 09/09/2017  . Essential hypertension, benign 02/28/2017  . Advance care planning 09/30/2016  . Mild intermittent asthma 08/21/2016  . Hypertriglyceridemia   . Benign prostatic hyperplasia   . Ascending aorta dilatation ([facility]) 08/21/2013  . Aortic valve regurgitation 07/20/2011  . Bicuspid aortic valve 07/02/2011  . Mild intermittent asthma without complication 27/01/5008  . ALLERGIC RHINITIS 04/27/2009  . Hypothyroidism 04/14/2008   Past Medical History:  Past Medical History:  Diagnosis Date  . Allergy    allergic rhinitis  . Aortic root dilatation ([place])    a. s/p surgical repair 08/27/2019  . Asthma    mostly in childhood  . Bicuspid aortic valve    a.  Status post bioprosthetic replacement 08/27/2019  . BPH (benign prostatic hypertrophy)   . CAD (coronary artery disease)    a. s/p 2-v CABG 08/2019 (SVG-OM, SVG--rPDA) *Grafts subsequently ligated due to bleeding  . GERD (gastroesophageal reflux disease)   . History of kidney stones   . Hypertension   . Hypertriglyceridemia   . Hypothyroidism   . Pericardial tamponade 09/25/2019  . Severe aortic  regurgitation    a. bicuspid aortic valve, b. s/p bio Bentall procedure 08/2019  . Wears glasses    Past Surgical History:  Past Surgical History:  Procedure Laterality Date  . BENTALL PROCEDURE N/A 08/27/2019   Procedure: BENTALL PROCEDURE with a 27 mm KONECT Resilia Aortic Valved Conduit and 30 mm x 30 cm Hemashield Platinum straight graft.;  Surgeon: Wonda Olds, MD;  Location: [facility];  Service: Open Heart Surgery;  Laterality: N/A;  . CARDIAC CATHETERIZATION  2006   [place]  . CARDIOVASCULAR STRESS TEST  2006   [place]  . CHOLECYSTECTOMY  2006  . CORONARY ARTERY BYPASS GRAFT N/A 08/27/2019   Procedure: CORONARY ARTERY BYPASS GRAFTING (CABG) times 2 using right saphenous endoscopic vein harvesting to OM2 nad PDA.;  Surgeon: Wonda Olds, MD;  Location: [place];  Service: Open Heart Surgery;  Laterality: N/A;  . CYSTOSCOPY W/ URETERAL STENT PLACEMENT  05/24/2017   Procedure: left;  Surgeon: Nickie Retort, MD;  Location: [facility];  Service: Urology;;  . Consuela Mimes W/ URETERAL STENT PLACEMENT Left 06/11/2017   Procedure: CYSTOSCOPY WITH STENT REPLACEMENT;  Surgeon: Hollice Espy, MD;  Location: [facility];  Service: Urology;  Laterality: Left;  . CYSTOSCOPY WITH BIOPSY Left 06/11/2017   Procedure: RENAL PELVIS TUMOR WITH BIOPSY WITH POSSIBLE LASER ABLATION;  Surgeon: Hollice Espy, MD;  Location: [facility];  Service: Urology;  Laterality: Left;  . CYSTOSCOPY WITH STENT PLACEMENT Left 05/24/2017   Procedure: , cystoscopy,left ureteral stent placement and left ureteroscopy attempted, retrograde pylogram;  Surgeon: Nickie Retort, MD;  Location: [facility];  Service: Urology;  Laterality:  Left;  . EYE SURGERY Bilateral    Cataract Extraction with IOL  . FALSE ANEURYSM REPAIR  10/29/2019   REPAIR FALSE ANEURYSM RIGHT FEMORAL   . FALSE ANEURYSM REPAIR Right 10/29/2019   Procedure: REPAIR FALSE ANEURYSM RIGHT FEMORAL;  Surgeon: Marty Heck, MD;  Location: [place];  Service:  Vascular;  Laterality: Right;  . HEMATOMA EVACUATION N/A 09/25/2019   Procedure: Evacuation Hematoma - Mediastinal Ligation of saphenous vein graft x two with Cardiopulmonary Bypass;  Surgeon: Rexene Alberts, MD;  Location: [place];  Service: Thoracic;  Laterality: N/A;  . IR THORACENTESIS ASP PLEURAL SPACE W/IMG GUIDE  09/02/2019  . RIGHT/LEFT HEART CATH AND CORONARY ANGIOGRAPHY N/A 08/21/2019   Procedure: RIGHT/LEFT HEART CATH AND CORONARY ANGIOGRAPHY;  Surgeon: Minna Merritts, MD;  Location: [place] [facility];  Service: Cardiovascular;  Laterality: N/A;  . STERNOTOMY N/A 09/25/2019   Procedure: REDO STERNOTOMY;  Surgeon: Rexene Alberts, MD;  Location: [place];  Service: Thoracic;  Laterality: N/A;  . TEE WITHOUT CARDIOVERSION N/A 08/27/2019   Procedure: TRANSESOPHAGEAL ECHOCARDIOGRAM (TEE);  Surgeon: Wonda Olds, MD;  Location: [place];  Service: Open Heart Surgery;  Laterality: N/A;  . TEE WITHOUT CARDIOVERSION N/A 09/25/2019   Procedure: Transesophageal Echocardiogram ([place]);  Surgeon: Rexene Alberts, MD;  Location: [place];  Service: Thoracic;  Laterality: N/A;  . URETEROSCOPY WITH HOLMIUM LASER LITHOTRIPSY Left 06/11/2017   Procedure: URETEROSCOPY WITH HOLMIUM LASER LITHOTRIPSY;  Surgeon: Hollice Espy, MD;  Location: [facility];  Service: Urology;  Laterality: Left;  Marland Kitchen VEIN LIGATION AND STRIPPING Bilateral 1997   HPI:  Pt is a 65 year old male with extensive cardiac history including aortic valve repair, CABG, subsequent pericardial tamponade who presented with an episode of blurry vision and facial numbness. MRI brain was negative for acute changes. Multiple areas of chronic microhemorrhage in the brain.   Assessment / Plan / Recommendation Clinical Impression  Pt participated in speech/language/cognition evaluation evaluation with his wife and daughter present. Pt denied any baseline or acute changes in the aforementioned areas. [facility] Mental Status Examination  was completed to evaluate the pt's cognitive-linguistic skills. He achieved a score of 28/30 which is within the normal limits of 27 or more out of 30. No speech or language deficits were noted and his performance on informal cognitive-linguistic tasks was within functional limits. Further skilled SLP services are not clinically indicated at this time. Pt and his family were educated regarding this; all parties verbalized understanding as well as agreement with plan of care.    SLP Assessment  SLP Recommendation/Assessment: Patient does not need any further Speech Lanaguage Pathology Services SLP Visit Diagnosis: Cognitive communication deficit (R41.841)    Follow Up Recommendations  None    Frequency and Duration           SLP Evaluation Cognition  Overall Cognitive Status: Within Functional Limits for tasks assessed Arousal/Alertness: Awake/alert Orientation Level: Oriented X4 Attention: Focused;Sustained Focused Attention: Appears intact Sustained Attention: Appears intact Memory: Appears intact (Immediate: 5/5; delayed: 5/5) Awareness: Appears intact Problem Solving: Appears intact Executive Function: Reasoning;Sequencing;Organizing Reasoning: Appears intact Sequencing: Appears intact (Clock drawing: 4/4) Organizing: Appears intact (Backward digit span: 3/3)       Comprehension  Auditory Comprehension Overall Auditory Comprehension: Appears within functional limits for tasks assessed Yes/No Questions: Within Functional Limits Commands: Within Functional Limits Conversation: Complex Visual Recognition/Discrimination Discrimination: Within Function Limits Reading Comprehension Reading Status: Within funtional limits    Expression Expression Primary Mode of  Expression: Verbal Verbal Expression Overall Verbal Expression: Appears within functional limits for tasks assessed Initiation: No impairment Automatic Speech: Counting;Day of week;Month of year (WNL) Level of  Generative/Spontaneous Verbalization: Conversation Repetition: No impairment Naming: No impairment Pragmatics: No impairment   Oral / Motor  Oral Motor/Sensory Function Overall Oral Motor/Sensory Function: Within functional limits (pt reported ~ 5% numbness on the right) Motor Speech Overall Motor Speech: Appears within functional limits for tasks assessed Respiration: Within functional limits Phonation: Normal Resonance: Within functional limits Articulation: Within functional limitis Intelligibility: Intelligible Motor Planning: Witnin functional limits Motor Speech Errors: Not applicable   Lougenia Morrissey I. Hardin Negus, [place], [place] Office number [phone] Pager [place] 02/15/2021, 2:22 PM

## 2021-02-15 NOTE — ED Notes (Signed)
[facility] at bedside to transfer patient to [facility]. Pt A&O x4, no acute distress noted.

## 2021-02-15 NOTE — Telephone Encounter (Signed)
Dr Silvio Pate spoke to pt in the hospital.

## 2021-02-15 NOTE — Progress Notes (Signed)
  Echocardiogram 2D Echocardiogram with strain and 3D has been performed.  Darlina Sicilian M 02/15/2021, 11:39 AM

## 2021-02-15 NOTE — Plan of Care (Signed)
Pt arrived to unit a&oX4, vital signs stable. NIH scale complete. Pt states he hasn't had numbness since the first occurrence when he was at home. Will continue to monitor. Problem: Education: Goal: Knowledge of General Education information will improve Description: Including pain rating scale, medication(s)/side effects and non-pharmacologic comfort measures Outcome: Progressing   Problem: Health Behavior/Discharge Planning: Goal: Ability to manage health-related needs will improve Outcome: Progressing   Problem: Clinical Measurements: Goal: Diagnostic test results will improve Outcome: Progressing   Problem: Activity: Goal: Risk for activity intolerance will decrease Outcome: Progressing   Problem: Nutrition: Goal: Adequate nutrition will be maintained Outcome: Progressing

## 2021-02-15 NOTE — Evaluation (Signed)
Physical Therapy Evaluation and Discharge Patient Details Name: Jeffrey Tucker MRN: [id number] DOB: September 08, 1956 Today's Date: 02/15/2021   History of Present Illness  Pt is a 65 y/o male admitted 3/22 secondary to blurry vision and R lip numbness. MRI negative for acute infarct. Thought to be secondary to TIA. PMH includes CAD s/p CABG, HTN, and asthma.  Clinical Impression  Patient evaluated by Physical Therapy with no further acute PT needs identified. All education has been completed and the patient has no further questions. Pt overall steady with mobility tasks. No LOB noted. Scored 24 on DGI indicating low fall risk. Educated about "BE FAST" acronym in recognizing CVA symptoms. Pt reports he feels he is back to baseline. See below for any follow-up Physical Therapy or equipment needs. PT is signing off. Thank you for this referral. If needs change, please re-consult.      Follow Up Recommendations No PT follow up    Equipment Recommendations  None recommended by PT    Recommendations for Other Services       Precautions / Restrictions Precautions Precautions: None Restrictions Weight Bearing Restrictions: No      Mobility  Bed Mobility Overal bed mobility: Independent                  Transfers Overall transfer level: Independent                  Ambulation/Gait Ambulation/Gait assistance: Independent Gait Distance (Feet): 200 Feet Assistive device: None Gait Pattern/deviations: WFL(Within Functional Limits) Gait velocity: WFL   General Gait Details: No LOB noted during DGI tasks. Good gait speed. Pt reports feeling like he is at baseline.  Stairs Stairs: Yes Stairs assistance: Independent Stair Management: No rails;Alternating pattern;Forwards Number of Stairs: 10 General stair comments: Overall steady with no LOB noted.  Wheelchair Mobility    Modified Rankin (Stroke Patients Only) Modified Rankin (Stroke Patients Only) Pre-Morbid Rankin  Score: No symptoms Modified Rankin: No symptoms     Balance Overall balance assessment: Independent                               Standardized Balance Assessment Standardized Balance Assessment : Dynamic Gait Index   Dynamic Gait Index Level Surface: Normal Change in Gait Speed: Normal Gait with Horizontal Head Turns: Normal Gait with Vertical Head Turns: Normal Gait and Pivot Turn: Normal Step Over Obstacle: Normal Step Around Obstacles: Normal Steps: Normal Total Score: 24       Pertinent Vitals/Pain Pain Assessment: No/denies pain    Home Living Family/patient expects to be discharged to:: Private residence Living Arrangements: Spouse/significant other Available Help at Discharge: Family;Available 24 hours/day Type of Home: House Home Access: Level entry     Home Layout: Two level Home Equipment: Shower seat      Prior Function Level of Independence: Independent               Hand Dominance        Extremity/Trunk Assessment   Upper Extremity Assessment Upper Extremity Assessment: Overall WFL for tasks assessed    Lower Extremity Assessment Lower Extremity Assessment: Overall WFL for tasks assessed    Cervical / Trunk Assessment Cervical / Trunk Assessment: Normal  Communication   Communication: No difficulties  Cognition Arousal/Alertness: Awake/alert Behavior During Therapy: WFL for tasks assessed/performed Overall Cognitive Status: Within Functional Limits for tasks assessed  General Comments General comments (skin integrity, edema, etc.): Pt's wife and daughter present. Educated about "BE FAST" acronym in recognizing CVA symptoms.    Exercises     Assessment/Plan    PT Assessment Patent does not need any further PT services  PT Problem List         PT Treatment Interventions      PT Goals (Current goals can be found in the Care Plan section)  Acute Rehab PT  Goals Patient Stated Goal: to go home PT Goal Formulation: All assessment and education complete, DC therapy Time For Goal Achievement: 02/15/21 Potential to Achieve Goals: Good    Frequency     Barriers to discharge        Co-evaluation               AM-PAC PT "6 Clicks" Mobility  Outcome Measure Help needed turning from your back to your side while in a flat bed without using bedrails?: None Help needed moving from lying on your back to sitting on the side of a flat bed without using bedrails?: None Help needed moving to and from a bed to a chair (including a wheelchair)?: None Help needed standing up from a chair using your arms (e.g., wheelchair or bedside chair)?: None Help needed to walk in hospital room?: None Help needed climbing 3-5 steps with a railing? : None 6 Click Score: 24    End of Session Equipment Utilized During Treatment: Gait belt Activity Tolerance: Patient tolerated treatment well Patient left: in bed;with call bell/phone within reach;with family/visitor present Nurse Communication: Mobility status PT Visit Diagnosis: Other abnormalities of gait and mobility (R26.89)    Time: 6222-9798 PT Time Calculation (min) (ACUTE ONLY): 13 min   Charges:   PT Evaluation $PT Eval Low Complexity: 1 Low          Lou Miner, DPT  Acute Rehabilitation Services  Pager: [phone] Office: [phone]   Rudean Hitt 02/15/2021, 2:36 PM

## 2021-02-15 NOTE — H&P (Addendum)
History and Physical    Jeffrey Tucker RDE:[id number] DOB: 08/21/1956 DOA: 02/14/2021  Referring MD/NP/PA: Eugenie Norrie, MD PCP: Venia Carbon, MD  Patient coming from: Transfer from [facility]  Chief Complaint: Blurry vision and lip numbness.  I have personally briefly reviewed patient's old medical records in [place]   HPI: CID AGENA is a 65 y.o. male with medical history significant of hypertension, CAD s/p CABG in 2020, bicuspid aortic valve s/p replacement, asthma, and hypothyroidism who presents with complaints of blurry vision and lip numbness last night around 7:30 PM.  He was at [place] study when he first noticed symptoms.  Complained of being unable to see anything out of either eye.  Only being right side of his lip went numb like he had gone to the dentist.  Symptoms lasted couple minutes before gradually improving.  He had gone to check his blood pressure and noted that it was elevated at 160/85.  Denies having any focal weakness, headache, slurred speech, chest pain, dizziness, or palpitations.  He regularly takes a baby aspirin daily but is not on any other anticoagulation.  Denied having similar symptoms like this previously in the past.  ED Course: Upon admission into the emergency department patient was seen to be afebrile, pulse 47-67, blood pressure 114/73-162/81, and all other vital signs maintained.  CT scan of the brain showed no acute abnormalities.  Patient was seen by teleneurology.  Labs significant for calcium 8.7.  He was not a candidate for TPA due to quickly resolving symptoms.  TRH called to admit for completion of stroke work-up  Review of Systems  Constitutional: Negative for fever and malaise/fatigue.  HENT: Negative for ear discharge and nosebleeds.   Eyes: Positive for blurred vision. Negative for pain.  Respiratory: Negative for cough and shortness of breath.   Cardiovascular: Negative for chest pain, palpitations and leg swelling.   Gastrointestinal: Negative for abdominal pain, nausea and vomiting.  Genitourinary: Negative for dysuria and hematuria.  Musculoskeletal: Negative for joint pain and myalgias.  Skin: Negative for itching and rash.  Neurological: Positive for sensory change. Negative for dizziness, focal weakness, loss of consciousness and headaches.  Psychiatric/Behavioral: Negative for substance abuse. The patient is not nervous/anxious.     Past Medical History:  Diagnosis Date  . Allergy    allergic rhinitis  . Aortic root dilatation ([place])    a. s/p surgical repair 08/27/2019  . Asthma    mostly in childhood  . Bicuspid aortic valve    a.  Status post bioprosthetic replacement 08/27/2019  . BPH (benign prostatic hypertrophy)   . CAD (coronary artery disease)    a. s/p 2-v CABG 08/2019 (SVG-OM, SVG--rPDA) *Grafts subsequently ligated due to bleeding  . GERD (gastroesophageal reflux disease)   . History of kidney stones   . Hypertension   . Hypertriglyceridemia   . Hypothyroidism   . Pericardial tamponade 09/25/2019  . Severe aortic regurgitation    a. bicuspid aortic valve, b. s/p bio Bentall procedure 08/2019  . Wears glasses     Past Surgical History:  Procedure Laterality Date  . BENTALL PROCEDURE N/A 08/27/2019   Procedure: BENTALL PROCEDURE with a 27 mm KONECT Resilia Aortic Valved Conduit and 30 mm x 30 cm Hemashield Platinum straight graft.;  Surgeon: Wonda Olds, MD;  Location: [facility];  Service: Open Heart Surgery;  Laterality: N/A;  . CARDIAC CATHETERIZATION  2006   [place]  . CARDIOVASCULAR STRESS TEST  2006   [place]  .  CHOLECYSTECTOMY  2006  . CORONARY ARTERY BYPASS GRAFT N/A 08/27/2019   Procedure: CORONARY ARTERY BYPASS GRAFTING (CABG) times 2 using right saphenous endoscopic vein harvesting to OM2 nad PDA.;  Surgeon: Wonda Olds, MD;  Location: [place];  Service: Open Heart Surgery;  Laterality: N/A;  . CYSTOSCOPY W/ URETERAL STENT PLACEMENT  05/24/2017   Procedure:  left;  Surgeon: Nickie Retort, MD;  Location: [facility]S;  Service: Urology;;  . Consuela Mimes W/ URETERAL STENT PLACEMENT Left 06/11/2017   Procedure: CYSTOSCOPY WITH STENT REPLACEMENT;  Surgeon: Hollice Espy, MD;  Location: [facility];  Service: Urology;  Laterality: Left;  . CYSTOSCOPY WITH BIOPSY Left 06/11/2017   Procedure: RENAL PELVIS TUMOR WITH BIOPSY WITH POSSIBLE LASER ABLATION;  Surgeon: Hollice Espy, MD;  Location: [facility];  Service: Urology;  Laterality: Left;  . CYSTOSCOPY WITH STENT PLACEMENT Left 05/24/2017   Procedure: , cystoscopy,left ureteral stent placement and left ureteroscopy attempted, retrograde pylogram;  Surgeon: Nickie Retort, MD;  Location: [facility];  Service: Urology;  Laterality: Left;  . EYE SURGERY Bilateral    Cataract Extraction with IOL  . FALSE ANEURYSM REPAIR  10/29/2019   REPAIR FALSE ANEURYSM RIGHT FEMORAL   . FALSE ANEURYSM REPAIR Right 10/29/2019   Procedure: REPAIR FALSE ANEURYSM RIGHT FEMORAL;  Surgeon: Marty Heck, MD;  Location: [place];  Service: Vascular;  Laterality: Right;  . HEMATOMA EVACUATION N/A 09/25/2019   Procedure: Evacuation Hematoma - Mediastinal Ligation of saphenous vein graft x two with Cardiopulmonary Bypass;  Surgeon: Rexene Alberts, MD;  Location: [place];  Service: Thoracic;  Laterality: N/A;  . IR THORACENTESIS ASP PLEURAL SPACE W/IMG GUIDE  09/02/2019  . RIGHT/LEFT HEART CATH AND CORONARY ANGIOGRAPHY N/A 08/21/2019   Procedure: RIGHT/LEFT HEART CATH AND CORONARY ANGIOGRAPHY;  Surgeon: Minna Merritts, MD;  Location: [place];  Service: Cardiovascular;  Laterality: N/A;  . STERNOTOMY N/A 09/25/2019   Procedure: REDO STERNOTOMY;  Surgeon: Rexene Alberts, MD;  Location: [place];  Service: Thoracic;  Laterality: N/A;  . TEE WITHOUT CARDIOVERSION N/A 08/27/2019   Procedure: TRANSESOPHAGEAL ECHOCARDIOGRAM (TEE);  Surgeon: Wonda Olds, MD;  Location: [place];  Service: Open Heart Surgery;  Laterality:  N/A;  . TEE WITHOUT CARDIOVERSION N/A 09/25/2019   Procedure: Transesophageal Echocardiogram (Tee);  Surgeon: Rexene Alberts, MD;  Location: [place];  Service: Thoracic;  Laterality: N/A;  . URETEROSCOPY WITH HOLMIUM LASER LITHOTRIPSY Left 06/11/2017   Procedure: URETEROSCOPY WITH HOLMIUM LASER LITHOTRIPSY;  Surgeon: Hollice Espy, MD;  Location: [facility];  Service: Urology;  Laterality: Left;  Marland Kitchen VEIN LIGATION AND STRIPPING Bilateral 1997     reports that he quit smoking about 42 years ago. His smoking use included cigarettes. He has never used smokeless tobacco. He reports that he does not drink alcohol and does not use drugs.  Allergies  Allergen Reactions  . Tetracycline Hcl Other (See Comments)    Joint pain (and stiffened them, also)  . Beta Adrenergic Blockers     Patient experienced abdomen vasospasms with Metoprolol succinate/tartrate and carvedilol.  . Metoprolol Tartrate     Asthma and cough  . Amlodipine Other (See Comments)    10mg  dose causes lower extremity swelling. Pt can tolerate 5mg  dose.    Family History  Problem Relation Age of Onset  . Alcohol abuse Mother   . Asthma Mother   . Alcohol abuse Father   . Prostate cancer Father   . Liver cancer Paternal Grandmother   .  Colon cancer Neg Hx   . Coronary artery disease Neg Hx   . Hypertension Neg Hx   . Diabetes Neg Hx   . Bladder Cancer Neg Hx   . Kidney cancer Neg Hx     Prior to Admission medications   Medication Sig Start Date End Date Taking? Authorizing Provider  albuterol (VENTOLIN HFA) 108 (90 Base) MCG/ACT inhaler Inhale 2 puffs into the lungs 3 (three) times daily as needed for wheezing or shortness of breath. 04/28/19  Yes [provider]  aspirin EC 81 MG EC tablet Take 1 tablet (81 mg total) by mouth daily. 09/25/19  Yes Swayze, Ava, DO  atorvastatin (LIPITOR) 40 MG tablet Take 1 tablet (40 mg total) by mouth daily. 05/19/20  Yes Venia Carbon, MD  Fluticasone-Salmeterol (ADVAIR)  100-50 MCG/DOSE AEPB Inhale 1 puff into the lungs 2 (two) times daily. 02/08/20  Yes Venia Carbon, MD  levocetirizine (XYZAL) 5 MG tablet Take 5 mg by mouth every evening.   Yes [provider]  levothyroxine (SYNTHROID) 150 MCG tablet Take 1 tablet (150 mcg total) by mouth daily. 03/07/20  Yes Venia Carbon, MD    Physical Exam:  Constitutional: Older male currently in no acute distress Vitals:   02/15/21 0445 02/15/21 0615 02/15/21 0620 02/15/21 0759  BP:  140/84  127/74  Pulse: (!) 50 (!) 52  67  Resp: 14   15  Temp:   98 F (36.7 C) 97.9 F (36.6 C)  TempSrc:   Oral Oral  SpO2: 99% 97%  97%  Weight:   75.1 kg   Height:   5\' 6"  (4.481 m)    Eyes: PERRL, lids and conjunctivae normal ENMT: Mucous membranes are moist. Posterior pharynx clear of any exudate or lesions.Normal dentition.  Neck: normal, supple, no masses, no thyromegaly Respiratory: clear to auscultation bilaterally, no wheezing, no crackles. Normal respiratory effort. No accessory muscle use.  Cardiovascular: , no murmurs / rubs / gallops. No extremity edema. 2+ pedal pulses. No carotid bruits.  Abdomen: no tenderness, no masses palpated. No hepatosplenomegaly. Bowel sounds positive.  Musculoskeletal: no clubbing / cyanosis. No joint deformity upper and lower extremities. Good ROM, no contractures. Normal muscle tone.  Skin: no rashes, lesions, ulcers. No induration Neurologic: CN 2-12 grossly intact. Sensation intact,  Strength 5 out of 5 in both upper extremities Psychiatric: Normal judgment and insight. Alert and oriented x 3. Normal mood.     Labs on Admission: I have personally reviewed following labs and imaging studies  CBC: Recent Labs  Lab 02/14/21 2347  WBC 6.8  NEUTROABS 3.1  HGB 13.3  HCT 39.0  MCV 93.3  PLT 856   Basic Metabolic Panel: Recent Labs  Lab 02/14/21 2347  NA 137  K 3.5  CL 103  CO2 27  GLUCOSE 93  BUN 16  CREATININE 0.66  CALCIUM 8.7*   GFR: Estimated  Creatinine Clearance: 83.1 mL/min (by C-G formula based on SCr of 0.66 mg/dL). Liver Function Tests: Recent Labs  Lab 02/14/21 2347  AST 23  ALT 21  ALKPHOS 101  BILITOT 0.3  PROT 7.0  ALBUMIN 4.0   No results for input(s): LIPASE, AMYLASE in the last 168 hours. No results for input(s): AMMONIA in the last 168 hours. Coagulation Profile: Recent Labs  Lab 02/14/21 2347  INR 1.1   Cardiac Enzymes: No results for input(s): CKTOTAL, CKMB, CKMBINDEX, TROPONINI in the last 168 hours. BNP (last 3 results) No results for input(s): PROBNP in  the last 8760 hours. HbA1C: No results for input(s): HGBA1C in the last 72 hours. CBG: No results for input(s): GLUCAP in the last 168 hours. Lipid Profile: No results for input(s): CHOL, HDL, LDLCALC, TRIG, CHOLHDL, LDLDIRECT in the last 72 hours. Thyroid Function Tests: No results for input(s): TSH, T4TOTAL, FREET4, T3FREE, THYROIDAB in the last 72 hours. Anemia Panel: No results for input(s): VITAMINB12, FOLATE, FERRITIN, TIBC, IRON, RETICCTPCT in the last 72 hours. Urine analysis:    Component Value Date/Time   COLORURINE YELLOW 02/15/2021 0002   APPEARANCEUR CLEAR 02/15/2021 0002   APPEARANCEUR Cloudy (A) 06/19/2017 0828   LABSPEC 1.025 02/15/2021 0002   PHURINE 6.0 02/15/2021 0002   GLUCOSEU NEGATIVE 02/15/2021 0002   HGBUR NEGATIVE 02/15/2021 0002   BILIRUBINUR NEGATIVE 02/15/2021 0002   BILIRUBINUR Negative 06/19/2017 0828   KETONESUR NEGATIVE 02/15/2021 0002   PROTEINUR NEGATIVE 02/15/2021 0002   NITRITE NEGATIVE 02/15/2021 0002   LEUKOCYTESUR NEGATIVE 02/15/2021 0002   Sepsis Labs: Recent Results (from the past 240 hour(s))  Resp Panel by RT-PCR (Flu A&B, Covid) Nasopharyngeal Swab     Status: None   Collection Time: 02/15/21  1:35 AM   Specimen: Nasopharyngeal Swab; Nasopharyngeal(NP) swabs in vial transport medium  Result Value Ref Range Status   SARS Coronavirus 2 by RT PCR NEGATIVE NEGATIVE Final    Comment:  (NOTE) SARS-CoV-2 target nucleic acids are NOT DETECTED.  The SARS-CoV-2 RNA is generally detectable in upper respiratory specimens during the acute phase of infection. The lowest concentration of SARS-CoV-2 viral copies this assay can detect is 138 copies/mL. A negative result does not preclude SARS-Cov-2 infection and should not be used as the sole basis for treatment or other patient management decisions. A negative result may occur with  improper specimen collection/handling, submission of specimen other than nasopharyngeal swab, presence of viral mutation(s) within the areas targeted by this assay, and inadequate number of viral copies(<138 copies/mL). A negative result must be combined with clinical observations, patient history, and epidemiological information. The expected result is Negative.  Fact Sheet for Patients:  EntrepreneurPulse.com.au  Fact Sheet for Healthcare Providers:  IncredibleEmployment.be  This test is no t yet approved or cleared by the [place] FDA and  has been authorized for detection and/or diagnosis of SARS-CoV-2 by FDA under an Emergency Use Authorization (EUA). This EUA will remain  in effect (meaning this test can be used) for the duration of the COVID-19 declaration under Section 564(b)(1) of the Act, 21 U.S.C.section 360bbb-3(b)(1), unless the authorization is terminated  or revoked sooner.       Influenza A by PCR NEGATIVE NEGATIVE Final   Influenza B by PCR NEGATIVE NEGATIVE Final    Comment: (NOTE) The Xpert Xpress SARS-CoV-2/FLU/RSV plus assay is intended as an aid in the diagnosis of influenza from Nasopharyngeal swab specimens and should not be used as a sole basis for treatment. Nasal washings and aspirates are unacceptable for Xpert Xpress SARS-CoV-2/FLU/RSV testing.  Fact Sheet for Patients: EntrepreneurPulse.com.au  Fact Sheet for Healthcare  Providers: IncredibleEmployment.be  This test is not yet approved or cleared by the [place] FDA and has been authorized for detection and/or diagnosis of SARS-CoV-2 by FDA under an Emergency Use Authorization (EUA). This EUA will remain in effect (meaning this test can be used) for the duration of the COVID-19 declaration under Section 564(b)(1) of the Act, 21 U.S.C. section 360bbb-3(b)(1), unless the authorization is terminated or revoked.  Performed at [facility], [address] [place],  [place] [address]      Radiological Exams on Admission: CT HEAD WO CONTRAST  Result Date: 02/14/2021 CLINICAL DATA:  Sudden onset blurred vision and lip numbness on right side since resolved. Elevated blood pressure. Hx HTN. EXAM: CT HEAD WITHOUT CONTRAST TECHNIQUE: Contiguous axial images were obtained from the base of the skull through the vertex without intravenous contrast. COMPARISON:  None. FINDINGS: Brain: No evidence of large-territorial acute infarction. No parenchymal hemorrhage. No mass lesion. No extra-axial collection. No mass effect or midline shift. No hydrocephalus. Basilar cisterns are patent. Vascular: No hyperdense vessel. Atherosclerotic calcifications are present within the cavernous internal carotid arteries. Skull: No acute fracture or focal lesion. Old right lamina papyracea fracture. Sinuses/Orbits: Mucosal thickening of bilateral ethmoid, maxillary, frontal, sphenoid sinuses. The mastoid air cells are clear. Bilateral lens replacement. Otherwise the orbits are unremarkable. Other: None. IMPRESSION: 1. No acute intracranial abnormality. 2. Mucosal thickening of all the paranasal sinuses. Electronically Signed   By: Iven Finn M.D.   On: 02/14/2021 23:43    EKG: Independently reviewed.  Sinus bradycardia 55 bpm with first-degree heart block  Assessment/Plan TIA (transient ischemic attack): Acute.  Patient presents with complaints of blurry  vision and right-sided lip numbness.  CT scan of the brain negative for any acute abnormalities.  He was not a TPA candidate.  Patient takes a daily aspirin and is not on any anticoagulants.  Hemoglobin A1c 5.7.  MRI   brain significant for no acute infarct, multiple areas of chronic microhemorrhage related to hypertension, and 1 cm calcified dural mass in the right parietal lobe. -Admit to telemetry bed -Stroke order set initiated -Neuro checks -Check CTA of head and neck -PT/OT/Speech to eval and treat -Check echocardiogram  -Check lipid panel in a.m. -Dual antiplatelet therapy of ASA and Plavix -Appreciate neurology consultative services, will follow-up for any further recommendations   Mild intermittent asthma: Patient clear to auscultation on physical exam.  Asthma appears to be relatively well controlled -Albuterol nebs as needed -Pharmacy substitution of Dulera for Advair  Essential hypertension: Blood pressures 114/73-162/81.  Patient not on medication for treatment. -Continue to monitor  CAD s/p CABG s/p aortic valve replacement -Continue aspirin and statin  Hyperlipidemia -Follow-up lipid panel -Continue atorvastatin 40 mg daily  Hypothyroidism -Check TSH -Continue levothyroxine  DVT prophylaxis: Lovenox Code Status: full Family Communication: Wife and daughter updated at bedside Disposition Plan: Likely discharge home once medically Consults called: Neurology Admission status: Observation  Norval Morton MD [facility]   If 7PM-7AM, please contact night-coverage   02/15/2021, 9:16 AM

## 2021-02-16 DIAGNOSIS — G459 Transient cerebral ischemic attack, unspecified: Secondary | ICD-10-CM | POA: Diagnosis not present

## 2021-02-16 DIAGNOSIS — I10 Essential (primary) hypertension: Secondary | ICD-10-CM | POA: Diagnosis not present

## 2021-02-16 LAB — LIPID PANEL
Cholesterol: 84 mg/dL (ref 0–200)
HDL: 24 mg/dL — ABNORMAL LOW (ref >40)
LDL Cholesterol: 38 mg/dL (ref 0–99)
Total CHOL/HDL Ratio: 3.5 RATIO
Triglycerides: 108 mg/dL (ref <150)
VLDL: 22 mg/dL (ref 0–40)

## 2021-02-16 MED ORDER — CLOPIDOGREL BISULFATE 75 MG PO TABS
75.0000 mg | ORAL_TABLET | Freq: Every day | ORAL | 0 refills | Status: AC
Start: 2021-02-17 — End: 2021-03-09

## 2021-02-16 NOTE — Progress Notes (Signed)
Jeffrey Tucker discharged home per MD order. D/C instructions discussed with the patient and wife and questions answered.  An After Visit Summary was printed and given to the patient.  Patient escorted via [place], and D/C home via private auto. Patient belongings and valuables sent with patient. Chai Routh J Taunja Brickner  02/16/2021 2:28 PM

## 2021-02-16 NOTE — Progress Notes (Signed)
STROKE TEAM PROGRESS NOTE   SUBJECTIVE (INTERVAL HISTORY) His wife is at the bedside.  Overall his condition is completely resolved. Pt and wife recounted HPI with me. Pt had 2-3 min of blurry vision and hard to focus and then right perioral numbness. Currently at baseline. Stroke work up neg. TSH low.   OBJECTIVE Temp:  [97.8 F (36.6 C)-98.1 F (36.7 C)] 98.1 F (36.7 C) (03/24 1212) Pulse Rate:  [50-60] 60 (03/24 1212) Cardiac Rhythm: Normal sinus rhythm (03/24 0800) Resp:  [15-18] 17 (03/24 0802) BP: (93-149)/(64-83) 131/83 (03/24 1212) SpO2:  [94 %-100 %] 97 % (03/24 1212)  No results for input(s): GLUCAP in the last 168 hours. Recent Labs  Lab 02/14/21 2347  NA 137  K 3.5  CL 103  CO2 27  GLUCOSE 93  BUN 16  CREATININE 0.66  CALCIUM 8.7*   Recent Labs  Lab 02/14/21 2347  AST 23  ALT 21  ALKPHOS 101  BILITOT 0.3  PROT 7.0  ALBUMIN 4.0   Recent Labs  Lab 02/14/21 2347  WBC 6.8  NEUTROABS 3.1  HGB 13.3  HCT 39.0  MCV 93.3  PLT 242   No results for input(s): CKTOTAL, CKMB, CKMBINDEX, TROPONINI in the last 168 hours. Recent Labs    02/14/21 2347  LABPROT 13.5  INR 1.1   Recent Labs    02/15/21 0002  COLORURINE YELLOW  LABSPEC 1.025  PHURINE 6.0  GLUCOSEU NEGATIVE  HGBUR NEGATIVE  BILIRUBINUR NEGATIVE  KETONESUR NEGATIVE  PROTEINUR NEGATIVE  NITRITE NEGATIVE  LEUKOCYTESUR NEGATIVE       Component Value Date/Time   CHOL 84 02/16/2021 0139   TRIG 108 02/16/2021 0139   HDL 24 (L) 02/16/2021 0139   CHOLHDL 3.5 02/16/2021 0139   VLDL 22 02/16/2021 0139   LDLCALC 38 02/16/2021 0139   Lab Results  Component Value Date   HGBA1C 5.7 (H) 02/15/2021      Component Value Date/Time   LABOPIA NONE DETECTED 02/15/2021 0002   COCAINSCRNUR NONE DETECTED 02/15/2021 0002   LABBENZ NONE DETECTED 02/15/2021 0002   AMPHETMU NONE DETECTED 02/15/2021 0002   THCU NONE DETECTED 02/15/2021 0002   LABBARB NONE DETECTED 02/15/2021 0002    Recent Labs   Lab 02/14/21 2347  ETH <10    I have personally reviewed the radiological images below and agree with the radiology interpretations.  CT ANGIO HEAD W OR WO CONTRAST  Result Date: 02/15/2021 CLINICAL DATA:  Stroke/TIA. Evaluate intracranial and extracranial arteries EXAM: CT ANGIOGRAPHY HEAD AND NECK TECHNIQUE: Multidetector CT imaging of the head and neck was performed using the standard protocol during bolus administration of intravenous contrast. Multiplanar CT image reconstructions and MIPs were obtained to evaluate the vascular anatomy. Carotid stenosis measurements (when applicable) are obtained utilizing NASCET criteria, using the distal internal carotid diameter as the denominator. CONTRAST:  67mL OMNIPAQUE IOHEXOL 350 MG/ML SOLN COMPARISON:  MRI head February 15, 2021.  CT head February 14, 2021. FINDINGS: CT HEAD FINDINGS Brain: No evidence of acute infarction, hemorrhage, hydrocephalus or extra-axial collection. A 9 mm calcified right parafalcine parietal lesion likely represent small meningioma. Vascular: No hyperdense vessel or unexpected calcification. Skull: Normal. Negative for fracture or focal lesion. Sinuses: Diffuse paranasal sinus disease. Orbits: No acute finding. Review of the MIP images confirms the above findings CTA NECK FINDINGS Aortic arch: Standard branching. Imaged portion shows mild ectasia of the arch measuring approximately 3.6 cm. No evidence of dissection. No significant stenosis of the major arch vessel origins. Right  carotid system: Minimal atherosclerotic changes of the right carotid bifurcation without hemodynamically significant stenosis. Left carotid system: Minimal atherosclerotic changes of the left carotid bifurcation without hemodynamically significant stenosis. Vertebral arteries: No evidence of dissection, stenosis (50% or greater) or occlusion. Skeleton: Postsurgical changes from prior sternotomy. Other neck: Negative. Upper chest: There appears to be compression  of the left brachiocephalic vein between the ascending aorta and the sternum. Review of the MIP images confirms the above findings CTA HEAD FINDINGS Anterior circulation: No significant stenosis, proximal occlusion, aneurysm, or vascular malformation. Posterior circulation: No significant stenosis, proximal occlusion, aneurysm, or vascular malformation. Venous sinuses: As permitted by contrast timing, patent. Review of the MIP images confirms the above findings IMPRESSION: 1. No acute intracranial findings. 2. No large vessel occlusion, hemodynamically significant stenosis, or evidence of dissection in the head or neck. 3. Apparent compression of the left brachiocephalic vein between the ascending aorta and the sternum. 4. Diffuse paranasal sinus disease. Electronically Signed   By: Pedro Earls M.D.   On: 02/15/2021 18:34   CT HEAD WO CONTRAST  Result Date: 02/14/2021 CLINICAL DATA:  Sudden onset blurred vision and lip numbness on right side since resolved. Elevated blood pressure. Hx HTN. EXAM: CT HEAD WITHOUT CONTRAST TECHNIQUE: Contiguous axial images were obtained from the base of the skull through the vertex without intravenous contrast. COMPARISON:  None. FINDINGS: Brain: No evidence of large-territorial acute infarction. No parenchymal hemorrhage. No mass lesion. No extra-axial collection. No mass effect or midline shift. No hydrocephalus. Basilar cisterns are patent. Vascular: No hyperdense vessel. Atherosclerotic calcifications are present within the cavernous internal carotid arteries. Skull: No acute fracture or focal lesion. Old right lamina papyracea fracture. Sinuses/Orbits: Mucosal thickening of bilateral ethmoid, maxillary, frontal, sphenoid sinuses. The mastoid air cells are clear. Bilateral lens replacement. Otherwise the orbits are unremarkable. Other: None. IMPRESSION: 1. No acute intracranial abnormality. 2. Mucosal thickening of all the paranasal sinuses. Electronically  Signed   By: Iven Finn M.D.   On: 02/14/2021 23:43   CT ANGIO NECK W OR WO CONTRAST  Result Date: 02/15/2021 CLINICAL DATA:  Stroke/TIA. Evaluate intracranial and extracranial arteries EXAM: CT ANGIOGRAPHY HEAD AND NECK TECHNIQUE: Multidetector CT imaging of the head and neck was performed using the standard protocol during bolus administration of intravenous contrast. Multiplanar CT image reconstructions and MIPs were obtained to evaluate the vascular anatomy. Carotid stenosis measurements (when applicable) are obtained utilizing NASCET criteria, using the distal internal carotid diameter as the denominator. CONTRAST:  47mL OMNIPAQUE IOHEXOL 350 MG/ML SOLN COMPARISON:  MRI head February 15, 2021.  CT head February 14, 2021. FINDINGS: CT HEAD FINDINGS Brain: No evidence of acute infarction, hemorrhage, hydrocephalus or extra-axial collection. A 9 mm calcified right parafalcine parietal lesion likely represent small meningioma. Vascular: No hyperdense vessel or unexpected calcification. Skull: Normal. Negative for fracture or focal lesion. Sinuses: Diffuse paranasal sinus disease. Orbits: No acute finding. Review of the MIP images confirms the above findings CTA NECK FINDINGS Aortic arch: Standard branching. Imaged portion shows mild ectasia of the arch measuring approximately 3.6 cm. No evidence of dissection. No significant stenosis of the major arch vessel origins. Right carotid system: Minimal atherosclerotic changes of the right carotid bifurcation without hemodynamically significant stenosis. Left carotid system: Minimal atherosclerotic changes of the left carotid bifurcation without hemodynamically significant stenosis. Vertebral arteries: No evidence of dissection, stenosis (50% or greater) or occlusion. Skeleton: Postsurgical changes from prior sternotomy. Other neck: Negative. Upper chest: There appears to be compression of the  left brachiocephalic vein between the ascending aorta and the sternum.  Review of the MIP images confirms the above findings CTA HEAD FINDINGS Anterior circulation: No significant stenosis, proximal occlusion, aneurysm, or vascular malformation. Posterior circulation: No significant stenosis, proximal occlusion, aneurysm, or vascular malformation. Venous sinuses: As permitted by contrast timing, patent. Review of the MIP images confirms the above findings IMPRESSION: 1. No acute intracranial findings. 2. No large vessel occlusion, hemodynamically significant stenosis, or evidence of dissection in the head or neck. 3. Apparent compression of the left brachiocephalic vein between the ascending aorta and the sternum. 4. Diffuse paranasal sinus disease. Electronically Signed   By: Pedro Earls M.D.   On: 02/15/2021 18:34   MR BRAIN WO CONTRAST  Result Date: 02/15/2021 CLINICAL DATA:  Stroke follow-up. Blurred vision and facial numbness. EXAM: MRI HEAD WITHOUT CONTRAST TECHNIQUE: Multiplanar, multiecho pulse sequences of the brain and surrounding structures were obtained without intravenous contrast. COMPARISON:  CT head 02/14/2021 FINDINGS: Brain: Negative for acute infarct. No significant chronic ischemic change. Ventricle size normal. Multiple areas of chronic microhemorrhage in both cerebral hemispheres and in the right cerebellum. Approximately 10 foci of microhemorrhage are noted. Focal dural calcification in the high right parietal lobe adjacent to the falx measures approximately 1 cm and is noted on CT. This is most likely meningioma. No edema in the adjacent brain. No other mass lesion. Vascular: Normal arterial flow voids. Skull and upper cervical spine: Negative Sinuses/Orbits: Moderate mucosal edema throughout the paranasal sinuses. Bilateral cataract extraction Other: None IMPRESSION: Negative for acute infarct. Multiple areas of chronic microhemorrhage in the brain. Correlate with history of hypertension 1 cm calcific dural base mass in the right parietal  lobe posteriorly likely a meningioma. Electronically Signed   By: Franchot Gallo M.D.   On: 02/15/2021 13:27   ECHOCARDIOGRAM COMPLETE  Result Date: 02/15/2021    ECHOCARDIOGRAM REPORT   Patient Name:   Jeffrey Tucker Date of Exam: 02/15/2021 Medical Rec #:  [id number]      Height:       66.0 in Accession #:    [id number]     Weight:       165.6 lb Date of Birth:  04-14-1956      BSA:          1.845 m Patient Age:    60 years       BP:           127/74 mmHg Patient Gender: M              HR:           59 bpm. Exam Location:  Inpatient Procedure: 2D Echo, 3D Echo, Cardiac Doppler, Color Doppler and Strain Analysis Indications:    TIA G45.9  History:        Patient has prior history of Echocardiogram examinations, most                 recent 09/25/2019. CAD, Prior CABG; Risk Factors:Hypertension.                 GERD. Aortic root dilation.  Sonographer:    Darlina Sicilian RDCS Referring Phys: [id number] [place]  1. Left ventricular ejection fraction, by estimation, is 60 to 65%. The left ventricle has normal function. The left ventricle has no regional wall motion abnormalities. There is moderate left ventricular hypertrophy. Left ventricular diastolic parameters are indeterminate. The average left ventricular global longitudinal strain is -17.6 %.  2.  Right ventricular systolic function is moderately reduced. The right ventricular size is normal. Tricuspid regurgitation signal is inadequate for assessing PA pressure.  3. There is a 27 mm KONECT Resilia valve present in the aortic position. Aortic valve regurgitation is trivial. Aortic valve mean gradient measures 9.0 mmHg, stable from prior echo  4. The mitral valve is normal in structure. No evidence of mitral valve regurgitation.  5. The inferior vena cava is normal in size with greater than 50% respiratory variability, suggesting right atrial pressure of 3 mmHg. FINDINGS  Left Ventricle: Left ventricular ejection fraction, by estimation, is 60 to  65%. The left ventricle has normal function. The left ventricle has no regional wall motion abnormalities. The average left ventricular global longitudinal strain is -17.6 %. The left ventricular internal cavity size was normal in size. There is moderate left ventricular hypertrophy. Left ventricular diastolic parameters are indeterminate. Right Ventricle: The right ventricular size is normal. No increase in right ventricular wall thickness. Right ventricular systolic function is moderately reduced. Tricuspid regurgitation signal is inadequate for assessing PA pressure. Left Atrium: Left atrial size was normal in size. Right Atrium: Right atrial size was normal in size. Pericardium: There is no evidence of pericardial effusion. Mitral Valve: The mitral valve is normal in structure. No evidence of mitral valve regurgitation. Tricuspid Valve: The tricuspid valve is normal in structure. Tricuspid valve regurgitation is trivial. Aortic Valve: The aortic valve has been repaired/replaced. Aortic valve regurgitation is trivial. Aortic valve mean gradient measures 9.0 mmHg. Aortic valve peak gradient measures 14.9 mmHg. There is a 27 mm KONECT Resilia valve present in the aortic position. Pulmonic Valve: The pulmonic valve was not well visualized. Pulmonic valve regurgitation is trivial. Aorta: The aortic root and ascending aorta are structurally normal, with no evidence of dilitation. Venous: The inferior vena cava is normal in size with greater than 50% respiratory variability, suggesting right atrial pressure of 3 mmHg. IAS/Shunts: The interatrial septum was not well visualized.  LEFT VENTRICLE PLAX 2D LVIDd:         4.40 cm Diastology LVIDs:         2.50 cm LV e' medial:    3.77 cm/s LV PW:         1.20 cm LV E/e' medial:  22.5 LV IVS:        1.30 cm LV e' lateral:   10.50 cm/s                        LV E/e' lateral: 8.1                         2D Longitudinal Strain                        2D Strain GLS Avg:     -17.6 %  RIGHT VENTRICLE RV S prime:     6.97 cm/s TAPSE (M-mode): 1.1 cm LEFT ATRIUM             Index       RIGHT ATRIUM           Index LA diam:        4.20 cm 2.28 cm/m  RA Area:     16.20 cm LA Vol (A2C):   34.7 ml 18.80 ml/m RA Volume:   43.30 ml  23.46 ml/m LA Vol (A4C):   24.0 ml 13.00 ml/m LA Biplane Vol: 29.3 ml  15.88 ml/m  AORTIC VALVE AV Vmax:           193.00 cm/s AV Vmean:          123.000 cm/s AV VTI:            0.351 m AV Peak Grad:      14.9 mmHg AV Mean Grad:      9.0 mmHg LVOT Vmax:         121.00 cm/s LVOT Vmean:        73.900 cm/s LVOT VTI:          0.217 m LVOT/AV VTI ratio: 0.62  AORTA Ao Asc diam: 3.50 cm MITRAL VALVE MV Area (PHT): 2.16 cm    SHUNTS MV Decel Time: 352 msec    Systemic VTI: 0.22 m MV E velocity: 84.80 cm/s MV A velocity: 79.70 cm/s MV E/A ratio:  1.06 Oswaldo Milian MD Electronically signed by Oswaldo Milian MD Signature Date/Time: 02/15/2021/2:56:43 PM    Final     PHYSICAL EXAM  Temp:  [97.8 F (36.6 C)-98.1 F (36.7 C)] 98.1 F (36.7 C) (03/24 1212) Pulse Rate:  [50-60] 60 (03/24 1212) Resp:  [15-18] 17 (03/24 0802) BP: (93-149)/(64-83) 131/83 (03/24 1212) SpO2:  [94 %-100 %] 97 % (03/24 1212)  General - Well nourished, well developed, in no apparent distress.  Ophthalmologic - fundi not visualized due to noncooperation.  Cardiovascular - Regular rhythm and rate.  Mental Status -  Level of arousal and orientation to time, place, and person were intact. Language including expression, naming, repetition, comprehension was assessed and found intact. Attention span and concentration were normal. Fund of Knowledge was assessed and was intact.  Cranial Nerves II - XII - II - Visual field intact OU. III, IV, VI - Extraocular movements intact. V - Facial sensation intact bilaterally. VII - Facial movement intact bilaterally VIII - Hearing & vestibular intact bilaterally X - Palate elevates symmetrically. XI - Chin turning & shoulder shrug  intact bilaterally. XII - Tongue protrusion intact.  Motor Strength - The patient's strength was normal in all extremities and pronator drift was absent.  Bulk was normal and fasciculations were absent.   Motor Tone - Muscle tone was assessed at the neck and appendages and was normal.  Reflexes - The patient's reflexes were symmetrical in all extremities and he had no pathological reflexes.  Sensory - Light touch, temperature/pinprick were assessed and were symmetrical.    Coordination - The patient had normal movements in the hands and feet with no ataxia or dysmetria.  Tremor was absent.  Gait and Station - deferred.   ASSESSMENT/PLAN Jeffrey Tucker is a 65 y.o. male with history of CAD s/p CABG in 2020, AVR, HTN, HLD admitted for episode of blurry vision and right perioral numbness. No tPA given due to resolved symptoms.    TIA vs. Migraine equivalent   MRI  No acute infarct. Small miningioma at posterior medial parietal region  CT negative for acute finding  CTA head and neck unremarkable  2D Echo  EF 60-65%  LDL 38  HgbA1c 5.7  lovenox for VTE prophylaxis  aspirin 81 mg daily prior to admission, now on aspirin 81 mg daily and clopidogrel 75 mg daily DAPT for 3 weeks and then ASA alone  Patient counseled to be compliant with his antithrombotic medications  Ongoing aggressive stroke risk factor management  Therapy recommendations:  none  Disposition:  pending  Hypertension . Stable  Long term BP goal normotensive  Hyperlipidemia  Home meds:  lipitor 40   LDL 38, goal < 70  Now on lipitor 40  Continue statin at discharge  Other Stroke Risk Factors  Advanced age  Coronary artery disease s/p CABG in 2020  Other Active Problems  Hypothyroidism on synthroid with now low TSH - follow up with PCP to decrease synthroid dose - pt is aware  Hospital day # 0  Neurology will sign off. Please call with questions. Pt will follow up with stroke clinic NP  at [facility] in about 4 weeks. Thanks for the consult.   Rosalin Hawking, MD PhD Stroke Neurology 02/16/2021 1:27 PM    To contact Stroke Continuity provider, please refer to [email or web address] After hours, contact General Neurology

## 2021-02-19 NOTE — Discharge Summary (Signed)
[facility] Discharge Summary   Patient: Jeffrey Tucker:[id number]  PCP: Venia Carbon, MD  Date of admission: 02/14/2021   Date of discharge: 02/16/2021     Discharge Diagnoses:  Principal diagnosis TIA   Active Problems:   Hypothyroidism   Mild intermittent asthma without complication   Mild intermittent asthma   Essential hypertension, benign   S/P aortic valve replacement with bioprosthetic valve   Coronary atherosclerosis of native coronary artery   TIA (transient ischemic attack)  Admitted From: home Disposition:  Home   Recommendations for Outpatient Follow-up:  1. PCP: Follow-up with neurology as recommended 2. Follow up LABS/TEST:  ZIO monitor 3. New Meds: Plavix 3 weeks 4. Changed meds: none 5. Stopped meds: none   Follow-up Information    Venia Carbon, MD. Schedule an appointment as soon as possible for a visit in 1 week(s).   Specialties: Internal Medicine, Pediatrics Contact information: [place] [address] [phone]        [facility]. Schedule an appointment as soon as possible for a visit in 1 month(s).   Specialty: Neurology Contact information: [address] [place] [phone]             Discharge Instructions    Ambulatory referral to Neurology   Complete by: As directed    An appointment is requested in approximately: 4 weeks   Diet - low sodium heart healthy   Complete by: As directed    Increase activity slowly   Complete by: As directed       Diet recommendation: Cardiac diet  Activity: The patient is advised to gradually reintroduce usual activities, as tolerated  Discharge Condition: stable  Code Status: Full code   History of present illness: As per the H and P dictated on admission, " Jeffrey Tucker is a 65 y.o. male with medical history significant of hypertension, CAD s/p CABG in 2020, bicuspid aortic valve s/p  replacement, asthma, and hypothyroidism who presents with complaints of blurry vision and lip numbness last night around 7:30 PM.  He was at [employer] [facility] when he first noticed symptoms.  Complained of being unable to see anything out of either eye.  Only being right side of his lip went numb like he had gone to the dentist.  Symptoms lasted couple minutes before gradually improving.  He had gone to check his blood pressure and noted that it was elevated at 160/85.  Denies having any focal weakness, headache, slurred speech, chest pain, dizziness, or palpitations.  He regularly takes a baby aspirin daily but is not on any other anticoagulation.  Denied having similar symptoms like this previously in the past.  ED Course: Upon admission into the emergency department patient was seen to be afebrile, pulse 47-67, blood pressure 114/73-162/81, and all other vital signs maintained.  CT scan of the brain showed no acute abnormalities.  Patient was seen by teleneurology.  Labs significant for calcium 8.7.  He was not a candidate for TPA due to quickly resolving symptoms.  TRH called to admit for completion of stroke work-up"  Hospital Course:  Summary of his active problems in the hospital is as following.   TIA (transient ischemic attack) versus complicated migraine. CT head negative for any acute abnormality. Symptoms are improving therefore TPA candidate. MRI brain negative for acute infarct. CTA head and neck unremarkable. Echocardiogram shows preserved EF without valvular normality. LDL 38. Hemoglobin is a 5.7. Prior  to admission, discharge 81 mg aspirin daily and Plavix 75 mg for 3 weeks. Patient had a 30-day event monitor after her CABG which was unremarkable. Neurology recommended to complete work-up with ZIO monitor. PT OT and speech recommended no further therapy intervention needed outpatient.  Meningioma. At posterior medial parietal region. Asymptomatic. No further work-up recommended  by  neurology  Mild intermittent asthma:  Patient clear to auscultation on physical exam.  Asthma appears to be relatively well controlled  Essential hypertension:  Blood pressure stable without any medication.  CAD s/p CABG s/p aortic valve replacement -Continue aspirin and statin  Hyperlipidemia -Continue atorvastatin 40 mg daily  Hypothyroidism -Continue levothyroxine TSH is relatively low.  Recommend repeat TSH and free T4 with PCP.  Body mass index is 26.72 kg/m.   Patient was seen by physical therapy, who recommended No therapy needed on discharge. On the day of the discharge the patient's vitals were stable, and no other acute medical condition were reported by patient. The patient was felt safe to be discharge at Home with no therapy needed on discharge.  Consultants: Neurology  Procedures: Echocardiogram   DISCHARGE MEDICATION: Allergies as of 02/16/2021      Reactions   Tetracycline Hcl Other (See Comments)   Joint pain (and stiffened them, also)   Beta Adrenergic Blockers    Patient experienced abdomen vasospasms with Metoprolol succinate/tartrate and carvedilol.   Metoprolol Tartrate    Asthma and cough   Amlodipine Other (See Comments)   10mg  dose causes lower extremity swelling. Pt can tolerate 5mg  dose.      Medication List    TAKE these medications   albuterol 108 (90 Base) MCG/ACT inhaler Commonly known as: VENTOLIN HFA Inhale 2 puffs into the lungs 3 (three) times daily as needed for wheezing or shortness of breath.   aspirin 81 MG EC tablet Take 1 tablet (81 mg total) by mouth daily.   atorvastatin 40 MG tablet Commonly known as: LIPITOR Take 1 tablet (40 mg total) by mouth daily.   clopidogrel 75 MG tablet Commonly known as: PLAVIX Take 1 tablet (75 mg total) by mouth daily for 20 days.   Fluticasone-Salmeterol 100-50 MCG/DOSE Aepb Commonly known as: ADVAIR Inhale 1 puff into the lungs 2 (two) times daily.   levocetirizine 5 MG  tablet Commonly known as: XYZAL Take 5 mg by mouth every evening.   levothyroxine 150 MCG tablet Commonly known as: SYNTHROID Take 1 tablet (150 mcg total) by mouth daily.       Discharge Exam: Filed Weights   02/14/21 2146 02/15/21 0620  Weight: 75.3 kg 75.1 kg   Vitals:   02/16/21 0851 02/16/21 1212  BP:  131/83  Pulse:  60  Resp:    Temp:  98.1 F (36.7 C)  SpO2: 94% 97%   General: Appear in no distress, no Rash; Oral Mucosa Clear, moist. no Abnormal Neck Mass Or lumps, Conjunctiva normal  Cardiovascular: S1 and S2 Present, no Murmur Respiratory: good respiratory effort, Bilateral Air entry present and CTA, no Crackles, no wheezes Abdomen: Bowel Sound present, Soft and no tenderness Extremities: no Pedal edema Neurology: alert and oriented to time, place, and person affect appropriate. no new focal deficit  The results of significant diagnostics from this hospitalization (including imaging, microbiology, ancillary and laboratory) are listed below for reference.    Significant Diagnostic Studies: CT ANGIO HEAD W OR WO CONTRAST  Result Date: 02/15/2021 CLINICAL DATA:  Stroke/TIA. Evaluate intracranial and extracranial arteries EXAM: CT ANGIOGRAPHY HEAD AND  NECK TECHNIQUE: Multidetector CT imaging of the head and neck was performed using the standard protocol during bolus administration of intravenous contrast. Multiplanar CT image reconstructions and MIPs were obtained to evaluate the vascular anatomy. Carotid stenosis measurements (when applicable) are obtained utilizing NASCET criteria, using the distal internal carotid diameter as the denominator. CONTRAST:  71mL OMNIPAQUE IOHEXOL 350 MG/ML SOLN COMPARISON:  MRI head February 15, 2021.  CT head February 14, 2021. FINDINGS: CT HEAD FINDINGS Brain: No evidence of acute infarction, hemorrhage, hydrocephalus or extra-axial collection. A 9 mm calcified right parafalcine parietal lesion likely represent small meningioma. Vascular: No  hyperdense vessel or unexpected calcification. Skull: Normal. Negative for fracture or focal lesion. Sinuses: Diffuse paranasal sinus disease. Orbits: No acute finding. Review of the MIP images confirms the above findings CTA NECK FINDINGS Aortic arch: Standard branching. Imaged portion shows mild ectasia of the arch measuring approximately 3.6 cm. No evidence of dissection. No significant stenosis of the major arch vessel origins. Right carotid system: Minimal atherosclerotic changes of the right carotid bifurcation without hemodynamically significant stenosis. Left carotid system: Minimal atherosclerotic changes of the left carotid bifurcation without hemodynamically significant stenosis. Vertebral arteries: No evidence of dissection, stenosis (50% or greater) or occlusion. Skeleton: Postsurgical changes from prior sternotomy. Other neck: Negative. Upper chest: There appears to be compression of the left brachiocephalic vein between the ascending aorta and the sternum. Review of the MIP images confirms the above findings CTA HEAD FINDINGS Anterior circulation: No significant stenosis, proximal occlusion, aneurysm, or vascular malformation. Posterior circulation: No significant stenosis, proximal occlusion, aneurysm, or vascular malformation. Venous sinuses: As permitted by contrast timing, patent. Review of the MIP images confirms the above findings IMPRESSION: 1. No acute intracranial findings. 2. No large vessel occlusion, hemodynamically significant stenosis, or evidence of dissection in the head or neck. 3. Apparent compression of the left brachiocephalic vein between the ascending aorta and the sternum. 4. Diffuse paranasal sinus disease. Electronically Signed   By: Pedro Earls M.D.   On: 02/15/2021 18:34   CT HEAD WO CONTRAST  Result Date: 02/14/2021 CLINICAL DATA:  Sudden onset blurred vision and lip numbness on right side since resolved. Elevated blood pressure. Hx HTN. EXAM: CT HEAD  WITHOUT CONTRAST TECHNIQUE: Contiguous axial images were obtained from the base of the skull through the vertex without intravenous contrast. COMPARISON:  None. FINDINGS: Brain: No evidence of large-territorial acute infarction. No parenchymal hemorrhage. No mass lesion. No extra-axial collection. No mass effect or midline shift. No hydrocephalus. Basilar cisterns are patent. Vascular: No hyperdense vessel. Atherosclerotic calcifications are present within the cavernous internal carotid arteries. Skull: No acute fracture or focal lesion. Old right lamina papyracea fracture. Sinuses/Orbits: Mucosal thickening of bilateral ethmoid, maxillary, frontal, sphenoid sinuses. The mastoid air cells are clear. Bilateral lens replacement. Otherwise the orbits are unremarkable. Other: None. IMPRESSION: 1. No acute intracranial abnormality. 2. Mucosal thickening of all the paranasal sinuses. Electronically Signed   By: Iven Finn M.D.   On: 02/14/2021 23:43   CT ANGIO NECK W OR WO CONTRAST  Result Date: 02/15/2021 CLINICAL DATA:  Stroke/TIA. Evaluate intracranial and extracranial arteries EXAM: CT ANGIOGRAPHY HEAD AND NECK TECHNIQUE: Multidetector CT imaging of the head and neck was performed using the standard protocol during bolus administration of intravenous contrast. Multiplanar CT image reconstructions and MIPs were obtained to evaluate the vascular anatomy. Carotid stenosis measurements (when applicable) are obtained utilizing NASCET criteria, using the distal internal carotid diameter as the denominator. CONTRAST:  49mL OMNIPAQUE IOHEXOL  350 MG/ML SOLN COMPARISON:  MRI head February 15, 2021.  CT head February 14, 2021. FINDINGS: CT HEAD FINDINGS Brain: No evidence of acute infarction, hemorrhage, hydrocephalus or extra-axial collection. A 9 mm calcified right parafalcine parietal lesion likely represent small meningioma. Vascular: No hyperdense vessel or unexpected calcification. Skull: Normal. Negative for fracture  or focal lesion. Sinuses: Diffuse paranasal sinus disease. Orbits: No acute finding. Review of the MIP images confirms the above findings CTA NECK FINDINGS Aortic arch: Standard branching. Imaged portion shows mild ectasia of the arch measuring approximately 3.6 cm. No evidence of dissection. No significant stenosis of the major arch vessel origins. Right carotid system: Minimal atherosclerotic changes of the right carotid bifurcation without hemodynamically significant stenosis. Left carotid system: Minimal atherosclerotic changes of the left carotid bifurcation without hemodynamically significant stenosis. Vertebral arteries: No evidence of dissection, stenosis (50% or greater) or occlusion. Skeleton: Postsurgical changes from prior sternotomy. Other neck: Negative. Upper chest: There appears to be compression of the left brachiocephalic vein between the ascending aorta and the sternum. Review of the MIP images confirms the above findings CTA HEAD FINDINGS Anterior circulation: No significant stenosis, proximal occlusion, aneurysm, or vascular malformation. Posterior circulation: No significant stenosis, proximal occlusion, aneurysm, or vascular malformation. Venous sinuses: As permitted by contrast timing, patent. Review of the MIP images confirms the above findings IMPRESSION: 1. No acute intracranial findings. 2. No large vessel occlusion, hemodynamically significant stenosis, or evidence of dissection in the head or neck. 3. Apparent compression of the left brachiocephalic vein between the ascending aorta and the sternum. 4. Diffuse paranasal sinus disease. Electronically Signed   By: Pedro Earls M.D.   On: 02/15/2021 18:34   MR BRAIN WO CONTRAST  Result Date: 02/15/2021 CLINICAL DATA:  Stroke follow-up. Blurred vision and facial numbness. EXAM: MRI HEAD WITHOUT CONTRAST TECHNIQUE: Multiplanar, multiecho pulse sequences of the brain and surrounding structures were obtained without  intravenous contrast. COMPARISON:  CT head 02/14/2021 FINDINGS: Brain: Negative for acute infarct. No significant chronic ischemic change. Ventricle size normal. Multiple areas of chronic microhemorrhage in both cerebral hemispheres and in the right cerebellum. Approximately 10 foci of microhemorrhage are noted. Focal dural calcification in the high right parietal lobe adjacent to the falx measures approximately 1 cm and is noted on CT. This is most likely meningioma. No edema in the adjacent brain. No other mass lesion. Vascular: Normal arterial flow voids. Skull and upper cervical spine: Negative Sinuses/Orbits: Moderate mucosal edema throughout the paranasal sinuses. Bilateral cataract extraction Other: None IMPRESSION: Negative for acute infarct. Multiple areas of chronic microhemorrhage in the brain. Correlate with history of hypertension 1 cm calcific dural base mass in the right parietal lobe posteriorly likely a meningioma. Electronically Signed   By: Franchot Gallo M.D.   On: 02/15/2021 13:27   ECHOCARDIOGRAM COMPLETE  Result Date: 02/15/2021    ECHOCARDIOGRAM REPORT   Patient Name:   Jeffrey Tucker Date of Exam: 02/15/2021 Medical Rec #:  [id number]      Height:       66.0 in Accession #:    [id number]     Weight:       165.6 lb Date of Birth:  08/12/1956      BSA:          1.845 m Patient Age:    4 years       BP:           127/74 mmHg Patient Gender: M  HR:           59 bpm. Exam Location:  Inpatient Procedure: 2D Echo, 3D Echo, Cardiac Doppler, Color Doppler and Strain Analysis Indications:    TIA G45.9  History:        Patient has prior history of Echocardiogram examinations, most                 recent 09/25/2019. CAD, Prior CABG; Risk Factors:Hypertension.                 GERD. Aortic root dilation.  Sonographer:    Darlina Sicilian RDCS Referring Phys: [id number] [place]  1. Left ventricular ejection fraction, by estimation, is 60 to 65%. The left ventricle has normal  function. The left ventricle has no regional wall motion abnormalities. There is moderate left ventricular hypertrophy. Left ventricular diastolic parameters are indeterminate. The average left ventricular global longitudinal strain is -17.6 %.  2. Right ventricular systolic function is moderately reduced. The right ventricular size is normal. Tricuspid regurgitation signal is inadequate for assessing PA pressure.  3. There is a 27 mm KONECT Resilia valve present in the aortic position. Aortic valve regurgitation is trivial. Aortic valve mean gradient measures 9.0 mmHg, stable from prior echo  4. The mitral valve is normal in structure. No evidence of mitral valve regurgitation.  5. The inferior vena cava is normal in size with greater than 50% respiratory variability, suggesting right atrial pressure of 3 mmHg. FINDINGS  Left Ventricle: Left ventricular ejection fraction, by estimation, is 60 to 65%. The left ventricle has normal function. The left ventricle has no regional wall motion abnormalities. The average left ventricular global longitudinal strain is -17.6 %. The left ventricular internal cavity size was normal in size. There is moderate left ventricular hypertrophy. Left ventricular diastolic parameters are indeterminate. Right Ventricle: The right ventricular size is normal. No increase in right ventricular wall thickness. Right ventricular systolic function is moderately reduced. Tricuspid regurgitation signal is inadequate for assessing PA pressure. Left Atrium: Left atrial size was normal in size. Right Atrium: Right atrial size was normal in size. Pericardium: There is no evidence of pericardial effusion. Mitral Valve: The mitral valve is normal in structure. No evidence of mitral valve regurgitation. Tricuspid Valve: The tricuspid valve is normal in structure. Tricuspid valve regurgitation is trivial. Aortic Valve: The aortic valve has been repaired/replaced. Aortic valve regurgitation is trivial.  Aortic valve mean gradient measures 9.0 mmHg. Aortic valve peak gradient measures 14.9 mmHg. There is a 27 mm KONECT Resilia valve present in the aortic position. Pulmonic Valve: The pulmonic valve was not well visualized. Pulmonic valve regurgitation is trivial. Aorta: The aortic root and ascending aorta are structurally normal, with no evidence of dilitation. Venous: The inferior vena cava is normal in size with greater than 50% respiratory variability, suggesting right atrial pressure of 3 mmHg. IAS/Shunts: The interatrial septum was not well visualized.  LEFT VENTRICLE PLAX 2D LVIDd:         4.40 cm Diastology LVIDs:         2.50 cm LV e' medial:    3.77 cm/s LV PW:         1.20 cm LV E/e' medial:  22.5 LV IVS:        1.30 cm LV e' lateral:   10.50 cm/s                        LV E/e' lateral: 8.1  2D Longitudinal Strain                        2D Strain GLS Avg:     -17.6 % RIGHT VENTRICLE RV S prime:     6.97 cm/s TAPSE (M-mode): 1.1 cm LEFT ATRIUM             Index       RIGHT ATRIUM           Index LA diam:        4.20 cm 2.28 cm/m  RA Area:     16.20 cm LA Vol (A2C):   34.7 ml 18.80 ml/m RA Volume:   43.30 ml  23.46 ml/m LA Vol (A4C):   24.0 ml 13.00 ml/m LA Biplane Vol: 29.3 ml 15.88 ml/m  AORTIC VALVE AV Vmax:           193.00 cm/s AV Vmean:          123.000 cm/s AV VTI:            0.351 m AV Peak Grad:      14.9 mmHg AV Mean Grad:      9.0 mmHg LVOT Vmax:         121.00 cm/s LVOT Vmean:        73.900 cm/s LVOT VTI:          0.217 m LVOT/AV VTI ratio: 0.62  AORTA Ao Asc diam: 3.50 cm MITRAL VALVE MV Area (PHT): 2.16 cm    SHUNTS MV Decel Time: 352 msec    Systemic VTI: 0.22 m MV E velocity: 84.80 cm/s MV A velocity: 79.70 cm/s MV E/A ratio:  1.06 Oswaldo Milian MD Electronically signed by Oswaldo Milian MD Signature Date/Time: 02/15/2021/2:56:43 PM    Final     Microbiology: Recent Results (from the past 240 hour(s))  Resp Panel by RT-PCR (Flu A&B, Covid)  Nasopharyngeal Swab     Status: None   Collection Time: 02/15/21  1:35 AM   Specimen: Nasopharyngeal Swab; Nasopharyngeal(NP) swabs in vial transport medium  Result Value Ref Range Status   SARS Coronavirus 2 by RT PCR NEGATIVE NEGATIVE Final    Comment: (NOTE) SARS-CoV-2 target nucleic acids are NOT DETECTED.  The SARS-CoV-2 RNA is generally detectable in upper respiratory specimens during the acute phase of infection. The lowest concentration of SARS-CoV-2 viral copies this assay can detect is 138 copies/mL. A negative result does not preclude SARS-Cov-2 infection and should not be used as the sole basis for treatment or other patient management decisions. A negative result may occur with  improper specimen collection/handling, submission of specimen other than nasopharyngeal swab, presence of viral mutation(s) within the areas targeted by this assay, and inadequate number of viral copies(<138 copies/mL). A negative result must be combined with clinical observations, patient history, and epidemiological information. The expected result is Negative.  Fact Sheet for Patients:  EntrepreneurPulse.com.au  Fact Sheet for Healthcare Providers:  IncredibleEmployment.be  This test is no t yet approved or cleared by the [place] FDA and  has been authorized for detection and/or diagnosis of SARS-CoV-2 by FDA under an Emergency Use Authorization (EUA). This EUA will remain  in effect (meaning this test can be used) for the duration of the COVID-19 declaration under Section 564(b)(1) of the Act, 21 U.S.C.section 360bbb-3(b)(1), unless the authorization is terminated  or revoked sooner.       Influenza A by PCR NEGATIVE NEGATIVE Final   Influenza B by PCR  NEGATIVE NEGATIVE Final    Comment: (NOTE) The Xpert Xpress SARS-CoV-2/FLU/RSV plus assay is intended as an aid in the diagnosis of influenza from Nasopharyngeal swab specimens and should not be  used as a sole basis for treatment. Nasal washings and aspirates are unacceptable for Xpert Xpress SARS-CoV-2/FLU/RSV testing.  Fact Sheet for Patients: EntrepreneurPulse.com.au  Fact Sheet for Healthcare Providers: IncredibleEmployment.be  This test is not yet approved or cleared by the [place] FDA and has been authorized for detection and/or diagnosis of SARS-CoV-2 by FDA under an Emergency Use Authorization (EUA). This EUA will remain in effect (meaning this test can be used) for the duration of the COVID-19 declaration under Section 564(b)(1) of the Act, 21 U.S.C. section 360bbb-3(b)(1), unless the authorization is terminated or revoked.  Performed at [facility], [address] [place], [place] [address]      Labs: CBC: Recent Labs  Lab 02/14/21 2347  WBC 6.8  NEUTROABS 3.1  HGB 13.3  HCT 39.0  MCV 93.3  PLT 209   Basic Metabolic Panel: Recent Labs  Lab 02/14/21 2347  NA 137  K 3.5  CL 103  CO2 27  GLUCOSE 93  BUN 16  CREATININE 0.66  CALCIUM 8.7*   Liver Function Tests: Recent Labs  Lab 02/14/21 2347  AST 23  ALT 21  ALKPHOS 101  BILITOT 0.3  PROT 7.0  ALBUMIN 4.0   CBG: No results for input(s): GLUCAP in the last 168 hours.  Time spent: 35 minutes  Signed:  Berle Mull  [facility] 02/16/2021 9:06 AM

## 2021-02-20 ENCOUNTER — Telehealth: Payer: Self-pay

## 2021-02-20 NOTE — Telephone Encounter (Signed)
Transition Care Management Follow-up Telephone Call  Date of discharge and from where: [facility]/16/[facility]   How have you been since you were released from the hospital? Patient states that he is feeling great.  Any questions or concerns? Yes, Patient states that the hospital doctor mentioned the dosage on his thyroid medication may be too high. Patient wants Dr. Silvio Pate to let him know of this needs to be adjusted.  Items Reviewed:  Did the pt receive and understand the discharge instructions provided? Yes   Medications obtained and verified? Yes   Other? No   Any new allergies since your discharge? No   Dietary orders reviewed? Yes  Do you have support at home? Yes   Home Care and Equipment/Supplies: Were home health services ordered? not applicable If so, what is the name of the agency? N/A  Has the agency set up a time to come to the patient's home? not applicable Were any new equipment or medical supplies ordered?  No What is the name of the medical supply agency? N/A Were you able to get the supplies/equipment? not applicable Do you have any questions related to the use of the equipment or supplies? No  Functional Questionnaire: (I = Independent and D = Dependent) ADLs: I  Bathing/Dressing- I  Meal Prep- I  Eating- I  Maintaining continence- I  Transferring/Ambulation- I  Managing Meds- I  Follow up appointments reviewed:   PCP Hospital f/u appt confirmed? No  Patient states he feels fine and does not feel the need to come in at this time.  [facility] f/u appt confirmed? N/A   Are transportation arrangements needed? No   If their condition worsens, is the pt aware to call PCP or go to the Emergency Dept.? Yes  Was the patient provided with contact information for the PCP's office or ED? Yes  Was to pt encouraged to call back with questions or concerns? Yes

## 2021-02-20 NOTE — Telephone Encounter (Signed)
Transition Care Management Unsuccessful Follow-up Telephone Call  Date of discharge and from where:  [facility]16/[facility]   Attempts:  1st Attempt  Reason for unsuccessful TCM follow-up call:  Left voice message

## 2021-02-20 NOTE — Telephone Encounter (Signed)
Okay---thanks for checking on him though

## 2021-02-23 ENCOUNTER — Other Ambulatory Visit: Payer: Medicare Other

## 2021-02-27 ENCOUNTER — Other Ambulatory Visit: Payer: Self-pay | Admitting: Internal Medicine

## 2021-03-21 ENCOUNTER — Encounter: Payer: Self-pay | Admitting: Adult Health

## 2021-03-21 ENCOUNTER — Ambulatory Visit (INDEPENDENT_AMBULATORY_CARE_PROVIDER_SITE_OTHER): Payer: Medicare Other | Admitting: Adult Health

## 2021-03-21 VITALS — BP 133/76 | HR 55 | Ht 66.0 in | Wt 163.0 lb

## 2021-03-21 DIAGNOSIS — G43109 Migraine with aura, not intractable, without status migrainosus: Secondary | ICD-10-CM | POA: Diagnosis not present

## 2021-03-21 DIAGNOSIS — H539 Unspecified visual disturbance: Secondary | ICD-10-CM | POA: Diagnosis not present

## 2021-03-21 DIAGNOSIS — G459 Transient cerebral ischemic attack, unspecified: Secondary | ICD-10-CM | POA: Diagnosis not present

## 2021-03-21 NOTE — Progress Notes (Signed)
[facility] [address] [place]. [place] [address] [phone]       HOSPITAL FOLLOW UP NOTE  [name] Date of Birth:  1956-06-25 Medical Record Number:  [id number]   Reason for Referral:  hospital stroke follow up    SUBJECTIVE:   CHIEF COMPLAINT:  Chief Complaint  Patient presents with  . Follow-up    RM 14 alone  Pt is well, had blurred vision and numbness R arm briefly but it has resolved. No current symptoms.     HPI:   Jeffrey Tucker is a 65 y.o. male with history of CAD s/p CABG in 2020, AVR, HTN, HLD admitted on 02/14/2021 for episode of blurry vision and right perioral numbness.  Personally reviewed hospitalization pertinent progress notes, lab work and imaging with summary provided.  MRI negative for acute infarct and symptoms likely related to TIA vs migraine equivalent.  Recommended DAPT for 3 weeks and aspirin alone.  HTN stable.  LDL 38 on atorvastatin 20 mg daily.  No prior stroke history.  Evaluated by therapies without therapy needs and discharged home in stable condition.  TIA vs. Migraine equivalent   MRI  No acute infarct. Small miningioma at posterior medial parietal region  CT negative for acute finding  CTA head and neck unremarkable  2D Echo  EF 60-65%  LDL 38  HgbA1c 5.7  lovenox for VTE prophylaxis  aspirin 81 mg daily prior to admission, now on aspirin 81 mg daily and clopidogrel 75 mg daily DAPT for 3 weeks and then ASA alone  Patient counseled to be compliant with his antithrombotic medications  Ongoing aggressive stroke risk factor management  Therapy recommendations:  none  Disposition:  home  Today, 03/21/2021, Jeffrey Tucker is being seen for hospital follow-up unaccompanied  He was initially doing well upon discharge but reports yesterday morning had an episode of transient right arm numbness and right eye small visual impairment -describes octagon type shape in middle of vision towards the right  where it was blurred within that area but denies actual visual loss.  Right arm numbness subsided after approximately 2 minutes and vision improved after approximately 10 minutes.  No other associated symptoms or headache.  He has not had any reoccurring event.  He denies any prior history of migraines. He is unsure if he has a family history of migraines.  Completed 3 weeks DAPT and remains on aspirin alone as well as atorvastatin tolerating without side effects Blood pressure today 133/76 - monitors at home and typically 110-120s/70s  No further concerns at this time    ROS:   14 system review of systems performed and negative with exception of those listed in HPI  PMH:  Past Medical History:  Diagnosis Date  . Allergy    allergic rhinitis  . Aortic root dilatation ([place])    a. s/p surgical repair 08/27/2019  . Asthma    mostly in childhood  . Bicuspid aortic valve    a.  Status post bioprosthetic replacement 08/27/2019  . BPH (benign prostatic hypertrophy)   . CAD (coronary artery disease)    a. s/p 2-v CABG 08/2019 (SVG-OM, SVG--rPDA) *Grafts subsequently ligated due to bleeding  . GERD (gastroesophageal reflux disease)   . History of kidney stones   . Hypertension   . Hypertriglyceridemia   . Hypothyroidism   . Pericardial tamponade 09/25/2019  . Severe aortic regurgitation    a. bicuspid aortic valve, b. s/p bio Bentall procedure 08/2019  .  Wears glasses     PSH:  Past Surgical History:  Procedure Laterality Date  . BENTALL PROCEDURE N/A 08/27/2019   Procedure: BENTALL PROCEDURE with a 27 mm KONECT Resilia Aortic Valved Conduit and 30 mm x 30 cm Hemashield Platinum straight graft.;  Surgeon: Wonda Olds, MD;  Location: [facility];  Service: Open Heart Surgery;  Laterality: N/A;  . CARDIAC CATHETERIZATION  2006   [place]  . CARDIOVASCULAR STRESS TEST  2006   [place]  . CHOLECYSTECTOMY  2006  . CORONARY ARTERY BYPASS GRAFT N/A 08/27/2019   Procedure: CORONARY ARTERY  BYPASS GRAFTING (CABG) times 2 using right saphenous endoscopic vein harvesting to OM2 nad PDA.;  Surgeon: Wonda Olds, MD;  Location: [facility];  Service: Open Heart Surgery;  Laterality: N/A;  . CYSTOSCOPY W/ URETERAL STENT PLACEMENT  05/24/2017   Procedure: left;  Surgeon: Nickie Retort, MD;  Location: [facility];  Service: Urology;;  . Consuela Mimes W/ URETERAL STENT PLACEMENT Left 06/11/2017   Procedure: CYSTOSCOPY WITH STENT REPLACEMENT;  Surgeon: Hollice Espy, MD;  Location: [facility];  Service: Urology;  Laterality: Left;  . CYSTOSCOPY WITH BIOPSY Left 06/11/2017   Procedure: RENAL PELVIS TUMOR WITH BIOPSY WITH POSSIBLE LASER ABLATION;  Surgeon: Hollice Espy, MD;  Location: [facility];  Service: Urology;  Laterality: Left;  . CYSTOSCOPY WITH STENT PLACEMENT Left 05/24/2017   Procedure: , cystoscopy,left ureteral stent placement and left ureteroscopy attempted, retrograde pylogram;  Surgeon: Nickie Retort, MD;  Location: [facility];  Service: Urology;  Laterality: Left;  . EYE SURGERY Bilateral    Cataract Extraction with IOL  . FALSE ANEURYSM REPAIR  10/29/2019   REPAIR FALSE ANEURYSM RIGHT FEMORAL   . FALSE ANEURYSM REPAIR Right 10/29/2019   Procedure: REPAIR FALSE ANEURYSM RIGHT FEMORAL;  Surgeon: Marty Heck, MD;  Location: [facility];  Service: Vascular;  Laterality: Right;  . HEMATOMA EVACUATION N/A 09/25/2019   Procedure: Evacuation Hematoma - Mediastinal Ligation of saphenous vein graft x two with Cardiopulmonary Bypass;  Surgeon: Rexene Alberts, MD;  Location: [facility];  Service: Thoracic;  Laterality: N/A;  . IR THORACENTESIS ASP PLEURAL SPACE W/IMG GUIDE  09/02/2019  . RIGHT/LEFT HEART CATH AND CORONARY ANGIOGRAPHY N/A 08/21/2019   Procedure: RIGHT/LEFT HEART CATH AND CORONARY ANGIOGRAPHY;  Surgeon: Minna Merritts, MD;  Location: [place];  Service: Cardiovascular;  Laterality: N/A;  . STERNOTOMY N/A 09/25/2019   Procedure: REDO STERNOTOMY;  Surgeon: Rexene Alberts, MD;  Location: [facility];  Service: Thoracic;  Laterality: N/A;  . TEE WITHOUT CARDIOVERSION N/A 08/27/2019   Procedure: TRANSESOPHAGEAL ECHOCARDIOGRAM (TEE);  Surgeon: Wonda Olds, MD;  Location: [facility];  Service: Open Heart Surgery;  Laterality: N/A;  . TEE WITHOUT CARDIOVERSION N/A 09/25/2019   Procedure: Transesophageal Echocardiogram (Tee);  Surgeon: Rexene Alberts, MD;  Location: [facility];  Service: Thoracic;  Laterality: N/A;  . URETEROSCOPY WITH HOLMIUM LASER LITHOTRIPSY Left 06/11/2017   Procedure: URETEROSCOPY WITH HOLMIUM LASER LITHOTRIPSY;  Surgeon: Hollice Espy, MD;  Location: [facility];  Service: Urology;  Laterality: Left;  Marland Kitchen VEIN LIGATION AND STRIPPING Bilateral 1997    Social History:  Social History   Socioeconomic History  . Marital status: Married    Spouse name: Jeris Roser  . Number of children: 2  . Years of education: Not on file  . Highest education level: Not on file  Occupational History  . Occupation: Sales promotion account executive  . Occupation:      Comment:    .  Occupation: Adult nurse estate    Comment: Retired  Tobacco Use  . Smoking status: Former Smoker    Types: Cigarettes    Quit date: 11/26/1978    Years since quitting: 42.3  . Smokeless tobacco: Never Used  . Tobacco comment: social smoked till 1980's  Vaping Use  . Vaping Use: Never used  Substance and Sexual Activity  . Alcohol use: No    Alcohol/week: 0.0 standard drinks    Comment: none since 1984  . Drug use: No  . Sexual activity: Not on file  Other Topics Concern  . Not on file  Social History Narrative   ** Merged History Encounter **       Social Determinants of Health   Financial Resource Strain: Not on file  Food Insecurity: Not on file  Transportation Needs: Not on file  Physical Activity: Not on file  Stress: Not on file  Social Connections: Not on file  Intimate Partner Violence: Not on file    Family History:  Family History  Problem Relation Age of  Onset  . Alcohol abuse Mother   . Asthma Mother   . Alcohol abuse Father   . Prostate cancer Father   . Liver cancer Paternal Grandmother   . Colon cancer Neg Hx   . Coronary artery disease Neg Hx   . Hypertension Neg Hx   . Diabetes Neg Hx   . Bladder Cancer Neg Hx   . Kidney cancer Neg Hx     Medications:   Current Outpatient Medications on File Prior to Visit  Medication Sig Dispense Refill  . albuterol (VENTOLIN HFA) 108 (90 Base) MCG/ACT inhaler Inhale 2 puffs into the lungs 3 (three) times daily as needed for wheezing or shortness of breath.    Marland Kitchen aspirin EC 81 MG EC tablet Take 1 tablet (81 mg total) by mouth daily. 30 tablet 0  . atorvastatin (LIPITOR) 40 MG tablet Take 1 tablet (40 mg total) by mouth daily. 90 tablet 3  . Fluticasone-Salmeterol (ADVAIR) 100-50 MCG/DOSE AEPB INHALE ONE PUFF BY MOUTH TWICE A DAY 60 each 11  . levocetirizine (XYZAL) 5 MG tablet Take 5 mg by mouth every evening.    Marland Kitchen levothyroxine (SYNTHROID) 150 MCG tablet Take 1 tablet (150 mcg total) by mouth daily. 90 tablet 3   No current facility-administered medications on file prior to visit.    Allergies:   Allergies  Allergen Reactions  . Tetracycline Hcl Other (See Comments)    Joint pain (and stiffened them, also)  . Beta Adrenergic Blockers     Patient experienced abdomen vasospasms with Metoprolol succinate/tartrate and carvedilol.  . Metoprolol Tartrate     Asthma and cough  . Amlodipine Other (See Comments)    10mg  dose causes lower extremity swelling. Pt can tolerate 5mg  dose.      OBJECTIVE:  Physical Exam  Vitals:   03/21/21 1459  BP: 133/76  Pulse: (!) 55  Weight: 163 lb (73.9 kg)  Height: 5\' 6"  (1.676 m)   Body mass index is 26.31 kg/m. No exam data present  Post stroke PHQ 2/9 Depression screen PHQ 2/9 03/21/2021  Decreased Interest 0  Down, Depressed, Hopeless 0  PHQ - 2 Score 0  Some recent data might be hidden     General: well developed, well nourished,   very pleasant middle-age Caucasian male, seated, in no evident distress Head: head normocephalic and atraumatic.   Neck: supple with no carotid or supraclavicular bruits Cardiovascular: regular rate and  rhythm, no murmurs Musculoskeletal: no deformity Skin:  no rash/petichiae Vascular:  Normal pulses all extremities   Neurologic Exam Mental Status: Awake and fully alert.   Fluent speech and language.  Oriented to place and time. Recent and remote memory intact. Attention span, concentration and fund of knowledge appropriate. Mood and affect appropriate.  Cranial Nerves: Fundoscopic exam reveals sharp disc margins. Pupils equal, briskly reactive to light. Extraocular movements full without nystagmus. Visual fields full to confrontation. Hearing intact. Facial sensation intact. Face, tongue, palate moves normally and symmetrically.  Motor: Normal bulk and tone. Normal strength in all tested extremity muscles Sensory.: intact to touch , pinprick , position and vibratory sensation.  Coordination: Rapid alternating movements normal in all extremities. Finger-to-nose and heel-to-shin performed accurately bilaterally. Gait and Station: Arises from chair without difficulty. Stance is normal. Gait demonstrates normal stride length and balance without use of assistive device. Tandem walk and heel toe without difficulty.  Reflexes: 1+ and symmetric. Toes downgoing.     NIHSS  0 Modified Rankin  0      ASSESSMENT: Jeffrey Tucker is a 65 y.o. year old male presented with episode of blurred vision and right perioral numbness on 02/14/2021 likely TIA vs migraine equivalent as MRI negative for acute infarct.  Reports additional episode yesterday, 4/25, which consisted of transient right arm numbness that subsided after 2 minutes and right eye small area of blurred vision that subsided after approximately 10 minutes. Vascular risk factors include HTN, HLD, CAD s/p CABG 2020 and advanced age.       PLAN:  1. Transient visual impairment and numbness: a. Per symptoms, seems more consistent with migraine equivalent although unable to completely rule out TIA  b. Advised patient to continue to monitor for any additional symptoms and to notify office if this occurs.  Educational information provided to patient regarding migraine equivalent c. As unable to rule out TIA, recommend continued use of aspirin 81 mg daily and atorvastatin 40 mg daily for stroke prevention.  Also discussed secondary stroke prevention measures and importance of close PCP follow up for aggressive stroke risk factor management including HTN with BP goal<130/90 and HLD with LDL goal<70 d. He is aware to call 911 immediately with any new or prolonged stroke type symptoms    Follow up in 4 months or call earlier if needed   CC:  [facility] provider: Dr. Leonie Man PCP: Venia Carbon, MD    I spent 45 minutes of face-to-face and non-face-to-face time with patient.  This included previsit chart review including recent hospitalization pertinent progress notes, lab work and imaging, lab review, study review, order entry, electronic health record documentation, patient education regarding recent episode and possible etiologies, importance of managing stroke risk factors, continued monitoring symptoms at home and answered all other questions to patient satisfaction  Frann Rider, [facility]  [facility] [address] [place], [place] [address]  Phone [phone] Fax [phone] Note: This document was prepared with digital dictation and possible smart phrase technology. Any transcriptional errors that result from this process are unintentional.

## 2021-03-21 NOTE — Patient Instructions (Addendum)
Unknown cause behind your symptoms - possibly complicated migraine vs TIA. Complicated migraines can occur without the actual headache - but as we cannot say for sure one way or another, we more treat this as a TIA or mini-stroke  Continue aspirin 81 mg daily  and atorvastatin  for secondary stroke prevention  Continue to follow up with PCP regarding cholesterol and blood pressure management  Maintain strict control of hypertension with blood pressure goal below 130/90 and cholesterol with LDL cholesterol (bad cholesterol) goal below 70 mg/dL.     Followup in the future with me in 4 months or call earlier if needed     Thank you for coming to see [place] at [facility]. I hope we have been able to provide you high quality care today.  You may receive a patient satisfaction survey over the next few weeks. We would appreciate your feedback and comments so that we may continue to improve ourselves and the health of our patients.

## 2021-03-23 NOTE — Progress Notes (Signed)
I agree with the above plan 

## 2021-03-24 NOTE — Consult Note (Signed)
[facility] Tele[facility]urology Consult Services  Stat Consult  Date of Service:   02/14/2021 23:49:03  Diagnosis:     .  G45.9 - Transient cerebral ischemic attack, unspecified  Impression: Patient is 77yoM with hx of HTN, HLD, CAD who p/w transient episode of blurry vision and right lip numbness. Currently he is at his baseline and NIHSS:0. CTH negative for acute abnormalities. Recommend to complete TIA work up given extensive cardiac history.  CT HEAD: Showed No Acute Hemorrhage or Acute Core Infarct  Our recommendations are outlined below.  Diagnostic Studies: Recommend MRI brain without contrast  Laboratory Studies: Recommend Lipid panel Hemoglobin A1c  Nursing Recommendations: Telemetry, IV Fluids, avoid dextrose containing fluids, Maintain euglycemia Neuro checks q4 hrs x 24 hrs and then per shift Head of bed 30 degrees  Consultations: Recommend Speech therapy if failed dysphagia screen Physical therapy/Occupational therapy  DVT Prophylaxis: Choice of Primary Team  Disposition: Neurology will follow   Metrics: [facility] Notification Time: 02/14/2021 23:47:24 Stamp Time: 02/14/2021 23:49:03 Callback Response Time: 02/14/2021 23:48:54   ----------------------------------------------------------------------------------------------------  Chief Complaint: Transient blurry vision and right lip numbness  History of Present Illness: Patient is a 65 year old Male.  Patient is 37yoM with hx of HTN, HLD, CAD who p/w transient episode of blurry vision and right lip numbness. He reports he was with friends during bible study when he suddenly felt he could not see or focus his vision. Then 30 seconds later he felt the right lower lip became numb. He continued to have these symptoms for about 3-5 min and it resolved. He had no associated headache, no dizziness, no n/v, no confusion or focal weakness. He measured his BP about 54min later and it was  160/85 He eventually was brought to ED for evaluation given his extensive cardia history. On my evaluation he is awake and alert, following commands. His vision is back to baseline. No further facial numbness. He denied similar symptoms in the past. No hx of strokes. He had CABG that failed about 61months ago with emergent repair. He also had valve replacement surgery.    Past Medical History:     . Hypertension     . Hyperlipidemia     . Coronary Artery Disease  Anticoagulant use:  No  Antiplatelet use: Yes ASA 81mg     Examination: BP(127/71), Pulse(58), Blood Glucose(93) 1A: Level of Consciousness - Alert; keenly responsive + 0 1B: Ask Month and Age - Both Questions Right + 0 1C: Blink Eyes & Squeeze Hands - Performs Both Tasks + 0 2: Test Horizontal Extraocular Movements - Normal + 0 3: Test Visual Fields - No Visual Loss + 0 4: Test Facial Palsy (Use Grimace if Obtunded) - Normal symmetry + 0 5A: Test Left Arm Motor Drift - No Drift for 10 Seconds + 0 5B: Test Right Arm Motor Drift - No Drift for 10 Seconds + 0 6A: Test Left Leg Motor Drift - No Drift for 5 Seconds + 0 6B: Test Right Leg Motor Drift - No Drift for 5 Seconds + 0 7: Test Limb Ataxia (FNF/Heel-Shin) - No Ataxia + 0 8: Test Sensation - Normal; No sensory loss + 0 9: Test Language/Aphasia - Normal; No aphasia + 0 10: Test Dysarthria - Normal + 0 11: Test Extinction/Inattention - No abnormality + 0  NIHSS Score: 0     Patient / Family was informed the Neurology Consult would occur via TeleHealth consult by way of interactive audio and video telecommunications and consented to receiving  care in this manner.  Patient is being evaluated for possible acute neurologic impairment and high probability of imminent or life - threatening deterioration.I spent total of 35 minutes providing care to this patient, including time for face to face visit via telemedicine, review of medical records, imaging studies and discussion  of findings with providers, the patient and / or family.   Dr Beryl Meager   [facility] [phone]  Case [id number]

## 2021-03-29 ENCOUNTER — Other Ambulatory Visit: Payer: Self-pay | Admitting: Internal Medicine

## 2021-05-22 ENCOUNTER — Ambulatory Visit (INDEPENDENT_AMBULATORY_CARE_PROVIDER_SITE_OTHER): Payer: Medicare Other | Admitting: Internal Medicine

## 2021-05-22 ENCOUNTER — Other Ambulatory Visit: Payer: Self-pay

## 2021-05-22 ENCOUNTER — Encounter: Payer: Self-pay | Admitting: Internal Medicine

## 2021-05-22 VITALS — BP 120/76 | HR 62 | Temp 97.2°F | Ht 66.0 in | Wt 159.0 lb

## 2021-05-22 DIAGNOSIS — J4520 Mild intermittent asthma, uncomplicated: Secondary | ICD-10-CM | POA: Diagnosis not present

## 2021-05-22 DIAGNOSIS — Z125 Encounter for screening for malignant neoplasm of prostate: Secondary | ICD-10-CM | POA: Diagnosis not present

## 2021-05-22 DIAGNOSIS — Z0000 Encounter for general adult medical examination without abnormal findings: Secondary | ICD-10-CM | POA: Diagnosis not present

## 2021-05-22 DIAGNOSIS — N401 Enlarged prostate with lower urinary tract symptoms: Secondary | ICD-10-CM

## 2021-05-22 DIAGNOSIS — I2510 Atherosclerotic heart disease of native coronary artery without angina pectoris: Secondary | ICD-10-CM | POA: Diagnosis not present

## 2021-05-22 DIAGNOSIS — G459 Transient cerebral ischemic attack, unspecified: Secondary | ICD-10-CM

## 2021-05-22 DIAGNOSIS — I712 Thoracic aortic aneurysm, without rupture, unspecified: Secondary | ICD-10-CM

## 2021-05-22 DIAGNOSIS — E039 Hypothyroidism, unspecified: Secondary | ICD-10-CM

## 2021-05-22 DIAGNOSIS — J3089 Other allergic rhinitis: Secondary | ICD-10-CM

## 2021-05-22 NOTE — Assessment & Plan Note (Signed)
Vs atypical migraine On ASA and statin

## 2021-05-22 NOTE — Assessment & Plan Note (Signed)
Not ready for meds tamsulosin if worsens

## 2021-05-22 NOTE — Progress Notes (Deleted)
Hearing Screening   500Hz  1000Hz  2000Hz  4000Hz   Right ear 20 20 20 20   Left ear 20 20 20 20   Vision Screening - Comments:: Eye exam was completed 12/27/2020 at [facility]

## 2021-05-22 NOTE — Assessment & Plan Note (Signed)
I have personally reviewed the Medicare Annual Wellness questionnaire and have noted 1. The patient's medical and social history 2. Their use of alcohol, tobacco or illicit drugs 3. Their current medications and supplements 4. The patient's functional ability including ADL's, fall risks, home safety risks and hearing or visual             impairment. 5. Diet and physical activities 6. Evidence for depression or mood disorders  The patients weight, height, BMI and visual acuity have been recorded in the chart I have made referrals, counseling and provided education to the patient based review of the above and I have provided the pt with a written personalized care plan for preventive services.  I have provided you with a copy of your personalized plan for preventive services. Please take the time to review along with your updated medication list.  Prefers no COVID, flu or shingrix vaccines Colon due next year Will check PSA after discussion Exercises regularly

## 2021-05-22 NOTE — Assessment & Plan Note (Signed)
Seems euthyroid on the levothyoxine

## 2021-05-22 NOTE — Progress Notes (Signed)
Hearing Screening   500Hz  1000Hz  2000Hz  4000Hz   Right ear 20 20 20 20   Left ear 20 20 20 20    Vision Screening   Right eye Left eye Both eyes  Without correction     With correction 20/15 20/15 20/15   Comments: Eye exam was completed 12/27/2020 at [facility]

## 2021-05-22 NOTE — Assessment & Plan Note (Signed)
Will have him try doubling the xyzal at night Consider trying montelukast

## 2021-05-22 NOTE — Assessment & Plan Note (Signed)
No angina Saphenous grafts tied off due to bleeding--but no symptoms On ASA and statin

## 2021-05-22 NOTE — Progress Notes (Signed)
Subjective:    Patient ID: Jeffrey Tucker, male    DOB: 09-12-1956, 65 y.o.   MRN: [id number]  HPI Here for Welcome to Medicare visit and follow up of chronic health conditions This visit occurred during the SARS-CoV-2 public health emergency.  Safety protocols were in place, including screening questions prior to the visit, additional usage of staff PPE, and extensive cleaning of exam room while observing appropriate contact time as indicated for disinfecting solutions.   Reviewed form and advanced directives Reviewed other doctors No tobacco Rare glass of wine Exercising regularly--walks and resistance  Vision is fine Hearing is fine No falls No depression or anhedonia Independent with instrumental ADLs No memory problems  Had TIA in March---vision changes MRI normal----?migraine variant Saw neurologist  BP is good Mostly 116/70's when he checks No chest pain No palpitations No SOB or problems with exercise tolerance No edema  Having episodic frequent urination Nocturia x 2-3  Not ready for meds yet  Asthma controlled No regular cough No wheezing--but does have some congestion in throat in the morning Takes xyzal every day  Current Outpatient Medications on File Prior to Visit  Medication Sig Dispense Refill   albuterol (VENTOLIN HFA) 108 (90 Base) MCG/ACT inhaler Inhale 2 puffs into the lungs 3 (three) times daily as needed for wheezing or shortness of breath.     aspirin EC 81 MG EC tablet Take 1 tablet (81 mg total) by mouth daily. 30 tablet 0   atorvastatin (LIPITOR) 40 MG tablet Take 1 tablet (40 mg total) by mouth daily. 90 tablet 3   Fluticasone-Salmeterol (ADVAIR) 100-50 MCG/DOSE AEPB INHALE ONE PUFF BY MOUTH TWICE A DAY 60 each 11   levocetirizine (XYZAL) 5 MG tablet Take 5 mg by mouth every evening.     levothyroxine (SYNTHROID) 150 MCG tablet TAKE ONE TABLET BY MOUTH DAILY 90 tablet 0   No current facility-administered medications on file prior to  visit.    Allergies  Allergen Reactions   Tetracycline Hcl Other (See Comments)    Joint pain (and stiffened them, also)   Beta Adrenergic Blockers     Patient experienced abdomen vasospasms with Metoprolol succinate/tartrate and carvedilol.   Metoprolol Tartrate     Asthma and cough   Amlodipine Other (See Comments)    10mg  dose causes lower extremity swelling. Pt can tolerate 5mg  dose.    Past Medical History:  Diagnosis Date   Allergy    allergic rhinitis   Aortic root dilatation (HCC)    a. s/p surgical repair 08/27/2019   Asthma    mostly in childhood   Bicuspid aortic valve    a.  Status post bioprosthetic replacement 08/27/2019   BPH (benign prostatic hypertrophy)    CAD (coronary artery disease)    a. s/p 2-v CABG 08/2019 (SVG-OM, SVG--rPDA) *Grafts subsequently ligated due to bleeding   GERD (gastroesophageal reflux disease)    History of kidney stones    Hypertension    Hypertriglyceridemia    Hypothyroidism    Pericardial tamponade 09/25/2019   Severe aortic regurgitation    a. bicuspid aortic valve, b. s/p bio Bentall procedure 08/2019   Wears glasses     Past Surgical History:  Procedure Laterality Date   BENTALL PROCEDURE N/A 08/27/2019   Procedure: BENTALL PROCEDURE with a 27 mm KONECT Resilia Aortic Valved Conduit and 30 mm x 30 cm Hemashield Platinum straight graft.;  Surgeon: [facility], MD;  Location: [facility];  Service: Open Heart Surgery;  Laterality: N/A;   CARDIAC CATHETERIZATION  2006   [place]   CARDIOVASCULAR STRESS TEST  2006   [place]   CHOLECYSTECTOMY  2006   CORONARY ARTERY BYPASS GRAFT N/A 08/27/2019   Procedure: CORONARY ARTERY BYPASS GRAFTING (CABG) times 2 using right saphenous endoscopic vein harvesting to OM2 nad PDA.;  Surgeon: [facility], MD;  Location: [place];  Service: Open Heart Surgery;  Laterality: N/A;   CYSTOSCOPY W/ URETERAL STENT PLACEMENT  05/24/2017   Procedure: left;  Surgeon: Nickie Retort, MD;   Location: [facility];  Service: Urology;;   CYSTOSCOPY W/ URETERAL STENT PLACEMENT Left 06/11/2017   Procedure: CYSTOSCOPY WITH STENT REPLACEMENT;  Surgeon: Hollice Espy, MD;  Location: [facility];  Service: Urology;  Laterality: Left;   CYSTOSCOPY WITH BIOPSY Left 06/11/2017   Procedure: RENAL PELVIS TUMOR WITH BIOPSY WITH POSSIBLE LASER ABLATION;  Surgeon: Hollice Espy, MD;  Location: [facility];  Service: Urology;  Laterality: Left;   CYSTOSCOPY WITH STENT PLACEMENT Left 05/24/2017   Procedure: , cystoscopy,left ureteral stent placement and left ureteroscopy attempted, retrograde pylogram;  Surgeon: Nickie Retort, MD;  Location: [facility];  Service: Urology;  Laterality: Left;   EYE SURGERY Bilateral    Cataract Extraction with IOL   FALSE ANEURYSM REPAIR  10/29/2019   REPAIR FALSE ANEURYSM RIGHT FEMORAL    FALSE ANEURYSM REPAIR Right 10/29/2019   Procedure: REPAIR FALSE ANEURYSM RIGHT FEMORAL;  Surgeon: Marty Heck, MD;  Location: [place];  Service: Vascular;  Laterality: Right;   HEMATOMA EVACUATION N/A 09/25/2019   Procedure: Evacuation Hematoma - Mediastinal Ligation of saphenous vein graft x two with Cardiopulmonary Bypass;  Surgeon: Rexene Alberts, MD;  Location: [place];  Service: Thoracic;  Laterality: N/A;   IR THORACENTESIS ASP PLEURAL SPACE W/IMG GUIDE  09/02/2019   RIGHT/LEFT HEART CATH AND CORONARY ANGIOGRAPHY N/A 08/21/2019   Procedure: RIGHT/LEFT HEART CATH AND CORONARY ANGIOGRAPHY;  Surgeon: Minna Merritts, MD;  Location: [place] [facility];  Service: Cardiovascular;  Laterality: N/A;   STERNOTOMY N/A 09/25/2019   Procedure: REDO STERNOTOMY;  Surgeon: Rexene Alberts, MD;  Location: [place];  Service: Thoracic;  Laterality: N/A;   TEE WITHOUT CARDIOVERSION N/A 08/27/2019   Procedure: TRANSESOPHAGEAL ECHOCARDIOGRAM (TEE);  Surgeon: [facility], MD;  Location: [place];  Service: Open Heart Surgery;  Laterality: N/A;   TEE WITHOUT CARDIOVERSION N/A 09/25/2019    Procedure: Transesophageal Echocardiogram (Tee);  Surgeon: Rexene Alberts, MD;  Location: [place];  Service: Thoracic;  Laterality: N/A;   URETEROSCOPY WITH HOLMIUM LASER LITHOTRIPSY Left 06/11/2017   Procedure: URETEROSCOPY WITH HOLMIUM LASER LITHOTRIPSY;  Surgeon: Hollice Espy, MD;  Location: [facility];  Service: Urology;  Laterality: Left;   VEIN LIGATION AND STRIPPING Bilateral 1997    Family History  Problem Relation Age of Onset   Alcohol abuse Mother    Asthma Mother    Alcohol abuse Father    Prostate cancer Father    Liver cancer Paternal Grandmother    Colon cancer Neg Hx    Coronary artery disease Neg Hx    Hypertension Neg Hx    Diabetes Neg Hx    Bladder Cancer Neg Hx    Kidney cancer Neg Hx     Social History   Socioeconomic History   Marital status: Married    Spouse name: Lumir Demetriou   Number of children: 2   Years of education: Not on file   Highest education level: Not on file  Occupational History   Occupation: Sales promotion account executive   Occupation:      Comment:     Occupation: Adult nurse estate    Comment: Retired  Tobacco Use   Smoking status: Former    Pack years: 0.00    Types: Cigarettes    Quit date: 11/26/1978    Years since quitting: 42.5   Smokeless tobacco: Never   Tobacco comments:    social smoked till 1980's  Vaping Use   Vaping Use: Never used  Substance and Sexual Activity   Alcohol use: No    Alcohol/week: 0.0 standard drinks    Comment: none since 1984   Drug use: No   Sexual activity: Not on file  Other Topics Concern   Not on file  Social History Narrative   Has living will   Wife is health care POA---friends Joe and Thompson Grayer are alternates   Would accept resuscitation   Would accept feeding tube at first---but not if prolonged    Social Determinants of Health   Financial Resource Strain: Not on file  Food Insecurity: Not on file  Transportation Needs: Not on file  Physical Activity: Not on file  Stress: Not  on file  Social Connections: Not on file  Intimate Partner Violence: Not on file   Review of Systems Appetite is very good Weight down slightly over past year Sleeps well No more trouble with heartburn since heart surgery No swallowing problems Wears seat belt Teeth are good No suspicious skin lesions----wants growth on back checked, not new though Bowels are fine--no blood No sig back or joint pains    Objective:   Physical Exam Constitutional:      Appearance: Normal appearance.  HENT:     Mouth/Throat:     Comments: No lesions Eyes:     Conjunctiva/sclera: Conjunctivae normal.     Pupils: Pupils are equal, round, and reactive to light.  Cardiovascular:     Rate and Rhythm: Normal rate and regular rhythm.     Pulses: Normal pulses.     Heart sounds:    No gallop.     Comments: Gr 2/6 aortic systolic murmur and prominent S2 Pulmonary:     Effort: Pulmonary effort is normal.     Breath sounds: Normal breath sounds. No wheezing or rales.  Abdominal:     Palpations: Abdomen is soft.     Tenderness: There is no abdominal tenderness.  Musculoskeletal:     Cervical back: Neck supple.  Lymphadenopathy:     Cervical: No cervical adenopathy.  Skin:    General: Skin is warm.     Findings: No rash.     Comments: ~5-6cm lipoma in left low back  Neurological:     Mental Status: He is alert and oriented to person, place, and time.     Comments: President--- "Waymond Cera, Obama" [phone] [id number] Recall 3/3  Psychiatric:        Mood and Affect: Mood normal.        Behavior: Behavior normal.           Assessment & Plan:

## 2021-05-22 NOTE — Assessment & Plan Note (Signed)
Repaired aortic valve Still with murmur/prominent S2 Cardiology follow up yearly

## 2021-05-22 NOTE — Assessment & Plan Note (Signed)
Doing well on fluticasone/salmeterol

## 2021-05-23 ENCOUNTER — Other Ambulatory Visit: Payer: Self-pay

## 2021-05-23 LAB — CBC
HCT: 39.5 % (ref 39.0–52.0)
Hemoglobin: 13.5 g/dL (ref 13.0–17.0)
MCHC: 34.2 g/dL (ref 30.0–36.0)
MCV: 92.1 fl (ref 78.0–100.0)
Platelets: 235 10*3/uL (ref 150.0–400.0)
RBC: 4.29 Mil/uL (ref 4.22–5.81)
RDW: 13.6 % (ref 11.5–15.5)
WBC: 6.8 10*3/uL (ref 4.0–10.5)

## 2021-05-23 LAB — COMPREHENSIVE METABOLIC PANEL
ALT: 22 U/L (ref 0–53)
AST: 22 U/L (ref 0–37)
Albumin: 4.4 g/dL (ref 3.5–5.2)
Alkaline Phosphatase: 97 U/L (ref 39–117)
BUN: 20 mg/dL (ref 6–23)
CO2: 30 mEq/L (ref 19–32)
Calcium: 9.4 mg/dL (ref 8.4–10.5)
Chloride: 104 mEq/L (ref 96–112)
Creatinine, Ser: 0.99 mg/dL (ref 0.40–1.50)
GFR: 79.99 mL/min (ref >60.00)
Glucose, Bld: 78 mg/dL (ref 70–99)
Potassium: 4.1 mEq/L (ref 3.5–5.1)
Sodium: 141 mEq/L (ref 135–145)
Total Bilirubin: 0.7 mg/dL (ref 0.2–1.2)
Total Protein: 7.1 g/dL (ref 6.0–8.3)

## 2021-05-23 LAB — LIPID PANEL
Cholesterol: 132 mg/dL (ref 0–200)
HDL: 29.3 mg/dL — ABNORMAL LOW (ref >39.00)
NonHDL: 102.79
Total CHOL/HDL Ratio: 5
Triglycerides: 261 mg/dL — ABNORMAL HIGH (ref 0.0–149.0)
VLDL: 52.2 mg/dL — ABNORMAL HIGH (ref 0.0–40.0)

## 2021-05-23 LAB — T4, FREE: Free T4: 1.01 ng/dL (ref 0.60–1.60)

## 2021-05-23 LAB — PSA, MEDICARE: PSA: 0.71 ng/ml (ref 0.10–4.00)

## 2021-05-23 LAB — TSH: TSH: 0.07 u[IU]/mL — ABNORMAL LOW (ref 0.35–4.50)

## 2021-05-23 LAB — LDL CHOLESTEROL, DIRECT: Direct LDL: 66 mg/dL

## 2021-05-23 IMAGING — DX DG CHEST 1V PORT
1 series · 1 of 1 positions shown · non-contrast
Comparison: Radiograph 08/25/2019

CLINICAL DATA: Status post aortic valve replacement

EXAM:
PORTABLE CHEST 1 VIEW

[chest]
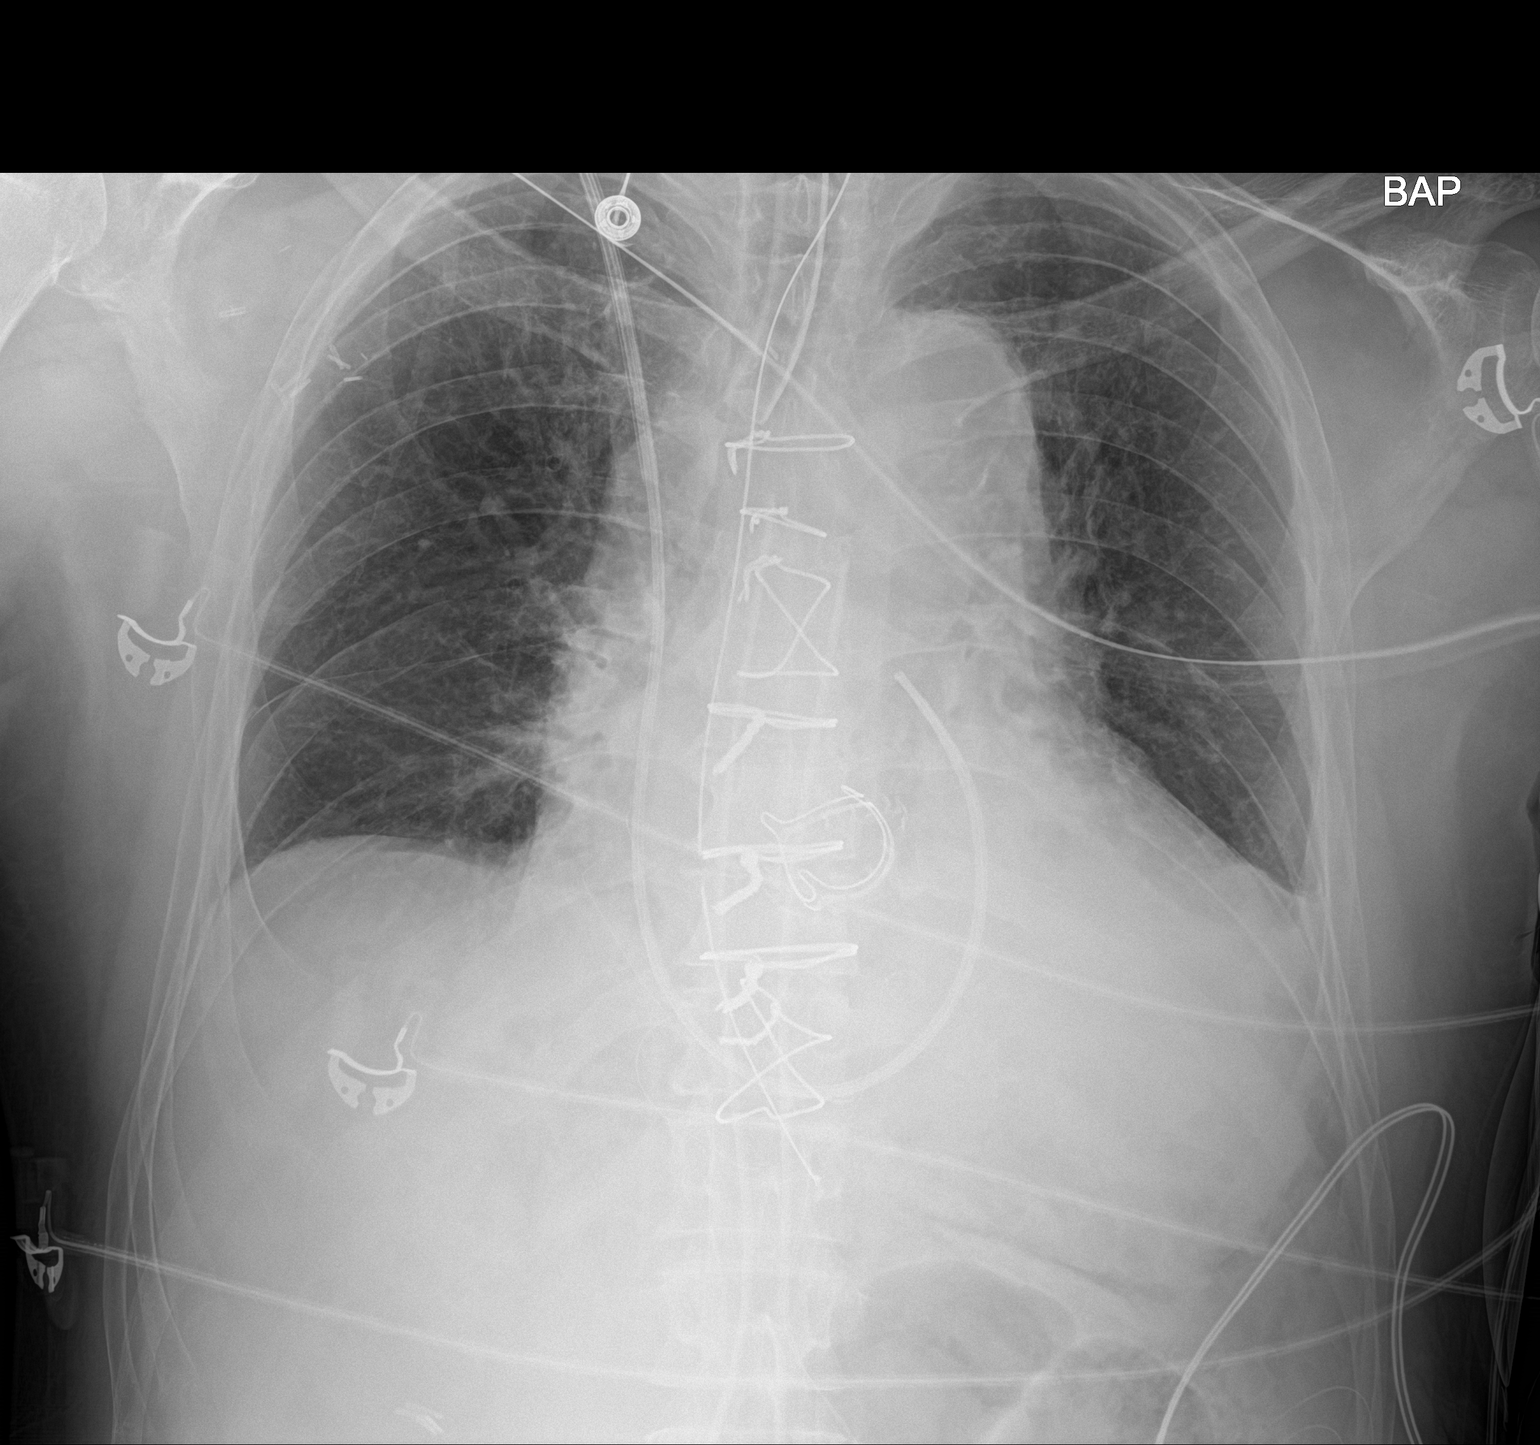

[1 of 1 positions shown; findings below may reference images not displayed]

FINDINGS: Endotracheal tube 3.2 cm from carina. NG tube with tip at the GE
junction. Swan-Ganz catheter tip in main pulmonary artery.
Mediastinal drains noted.

Stable cardiac silhouette. Small LEFT effusion and basilar
atelectasis. No pulmonary edema. No pneumothorax.
IMPRESSION: 1. Support apparatus appears in good position.
2. Small LEFT effusion and atelectasis.
3. No pneumothorax or pulmonary edema.

## 2021-05-24 IMAGING — DX DG CHEST 1V PORT
1 series · 1 of 1 positions shown · non-contrast
Comparison: 08/27/2019

CLINICAL DATA: Status post aortic valve surgery

EXAM:
PORTABLE CHEST 1 VIEW

[chest]
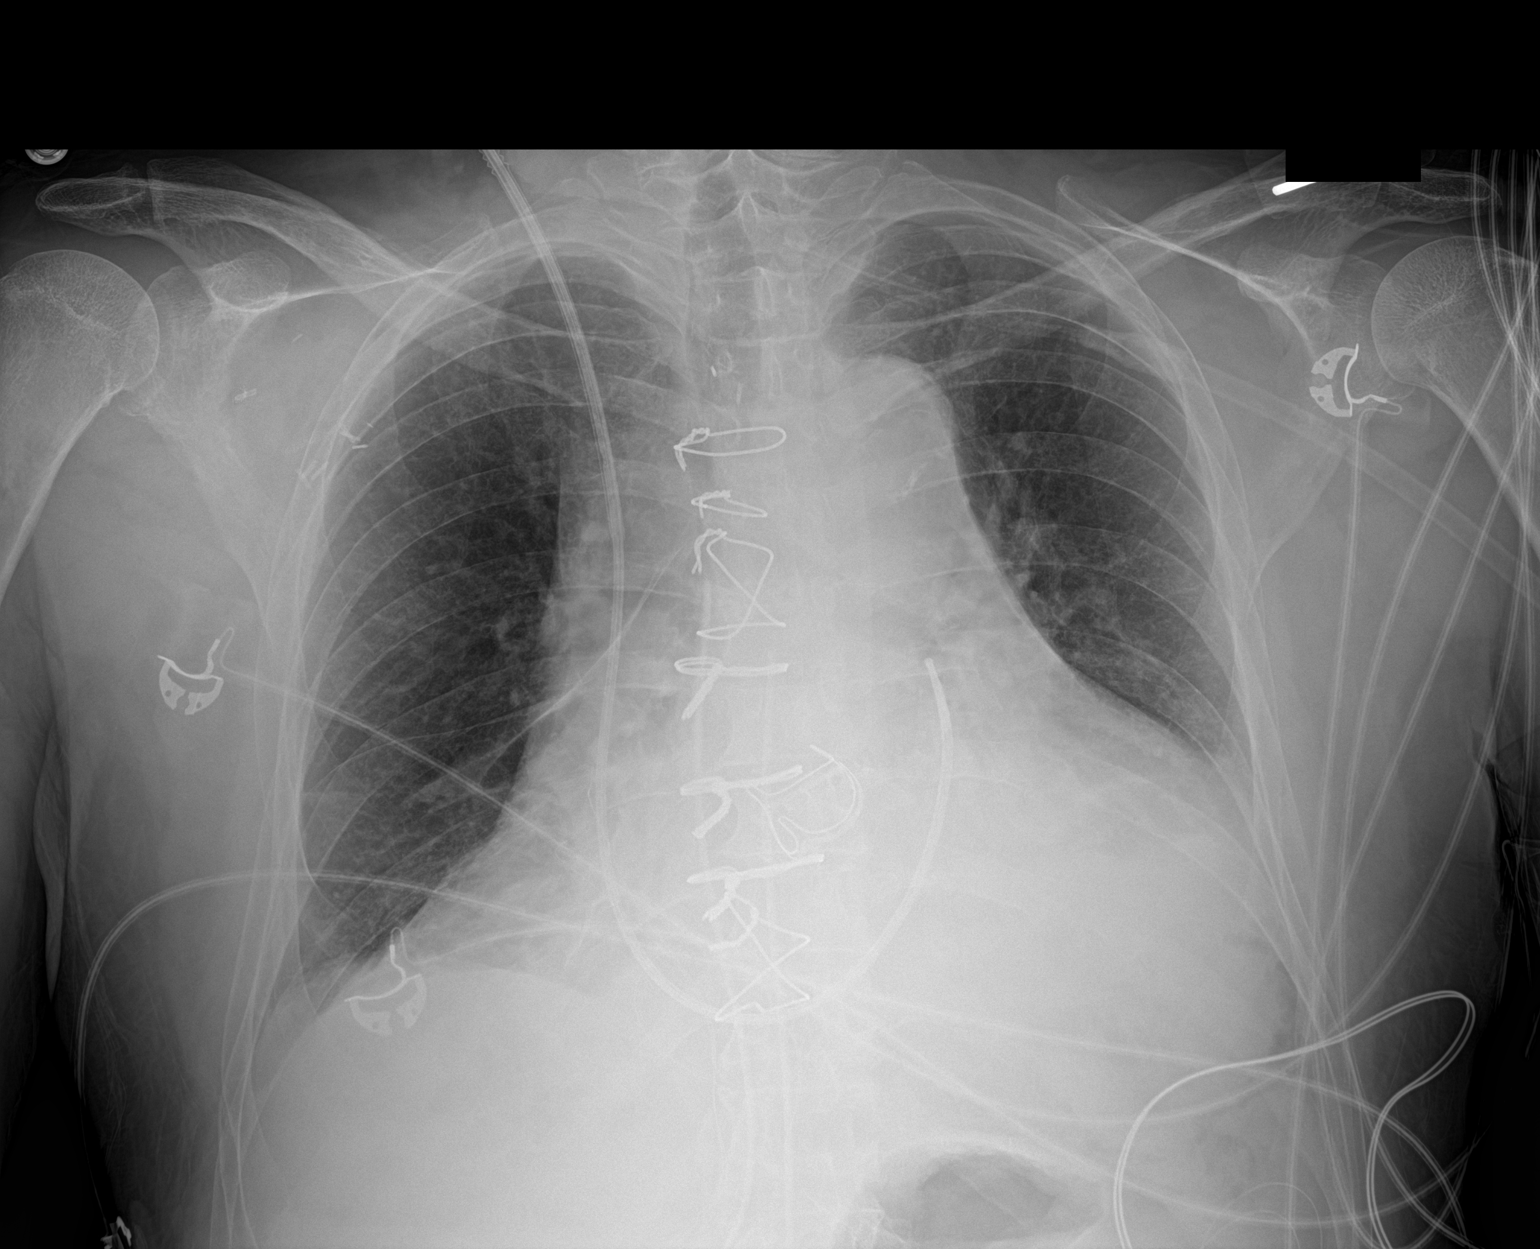

[1 of 1 positions shown; findings below may reference images not displayed]

FINDINGS: Cardiac shadow is enlarged but stable. Postsurgical changes are
again seen. Swan-Ganz catheter is noted in satisfactory position.
Two central mediastinal drains are noted and stable. Endotracheal
tube and nasogastric catheter have been removed in the interval. The
lungs are well aerated with mild left basilar atelectasis slightly
increased in the interval from the prior exam. No pneumothorax is
noted.
IMPRESSION: Increasing left basilar atelectasis.

Tubes and lines as described.

## 2021-05-24 MED ORDER — LEVOTHYROXINE SODIUM 125 MCG PO TABS: 125.0000 ug | ORAL_TABLET | Freq: Every day | ORAL | 3 refills | Status: DC

## 2021-05-25 ENCOUNTER — Other Ambulatory Visit: Payer: Self-pay | Admitting: Internal Medicine

## 2021-05-25 IMAGING — DX DG CHEST 1V PORT
1 series · 1 of 1 positions shown · non-contrast
Comparison: 08/28/2019

CLINICAL DATA: Chest tube placement.

EXAM:
PORTABLE CHEST 1 VIEW

[chest ap]
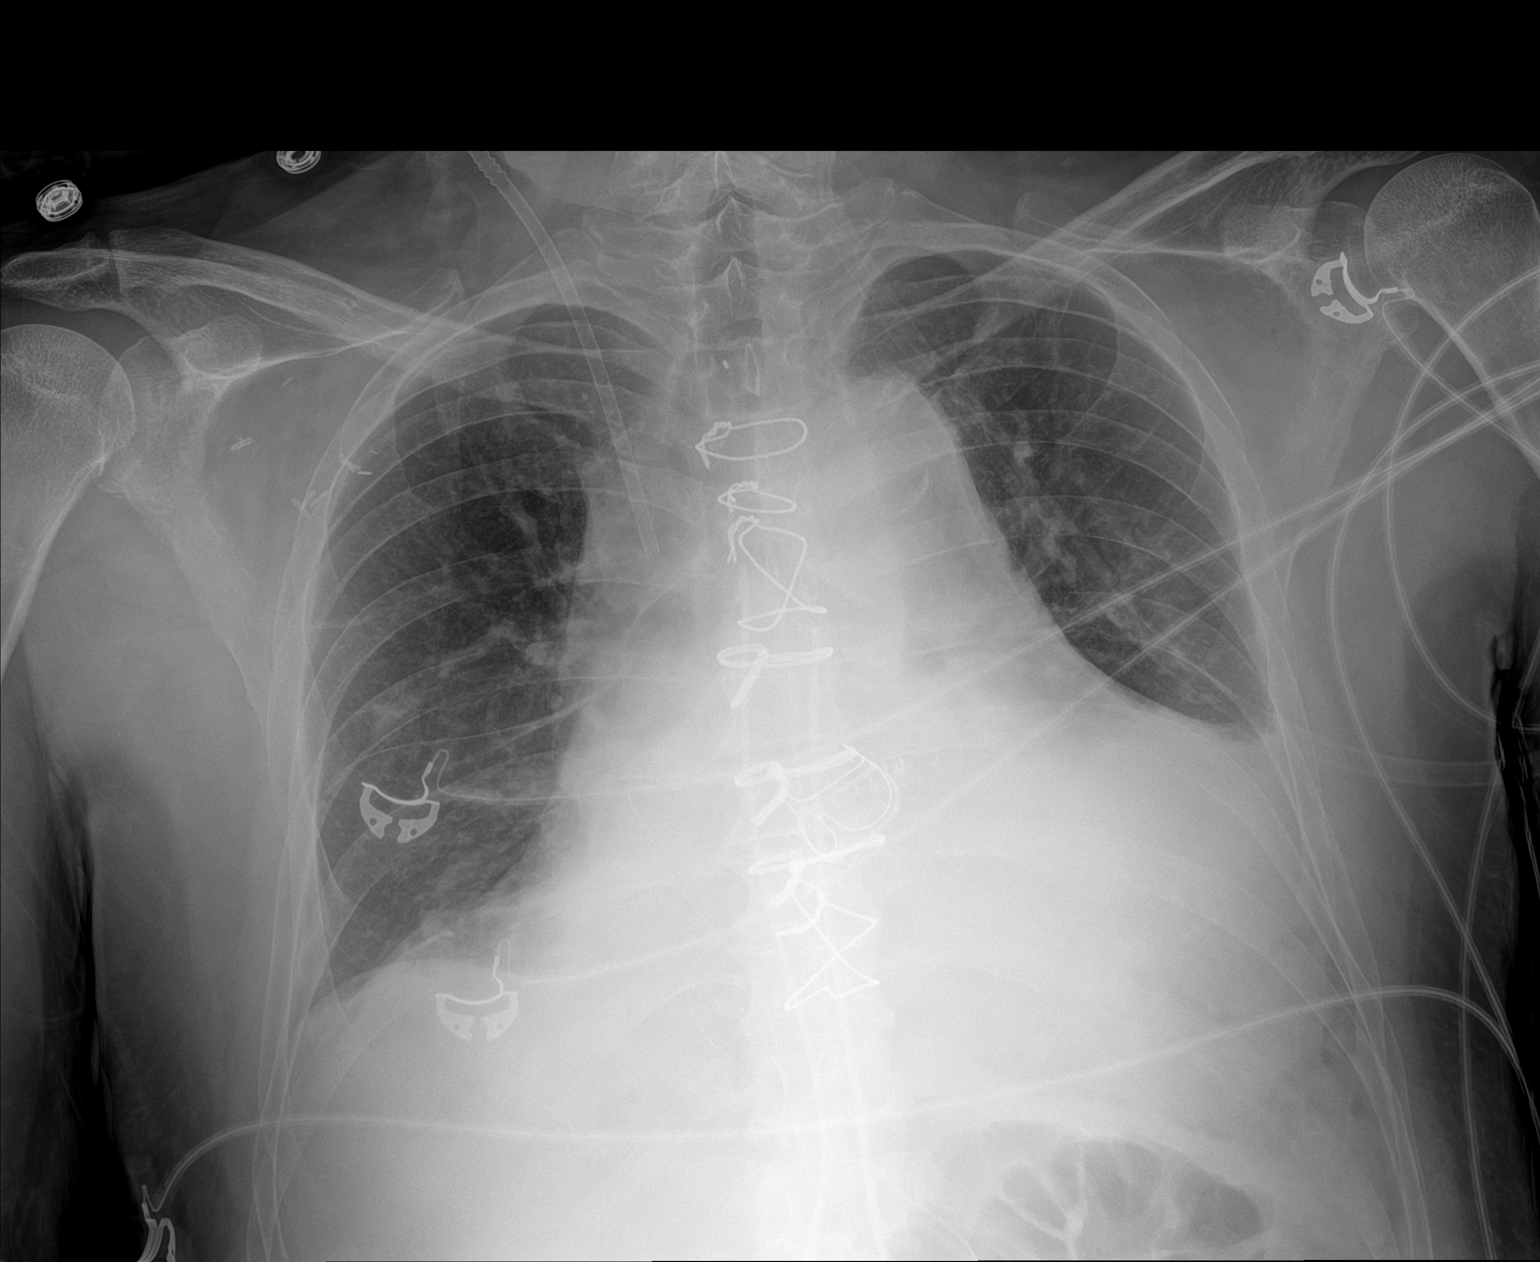

[1 of 1 positions shown; findings below may reference images not displayed]

FINDINGS: Right jugular central venous sheath. Removal of the Swan-Ganz
catheter.

Stable left basilar pleuroparenchymal disease. Mild bilateral
interstitial thickening. Stable enlarged cardiac silhouette. Prior
median sternotomy and aortic valve repair. No acute osseous
abnormality.
IMPRESSION: 1. Interval removal of the Swan-Ganz catheter. Right jugular central
venous sheath in satisfactory position.
2. Persistent left basilar pleuroparenchymal disease likely
reflecting a small pleural effusion and atelectasis.

## 2021-05-26 IMAGING — DX DG CHEST 1V PORT
1 series · 1 of 1 positions shown · non-contrast
Comparison: 08/29/2019

CLINICAL DATA: Chest tube in place.  Bentall procedure 08/27/2019.

EXAM:
PORTABLE CHEST 1 VIEW

[chest]
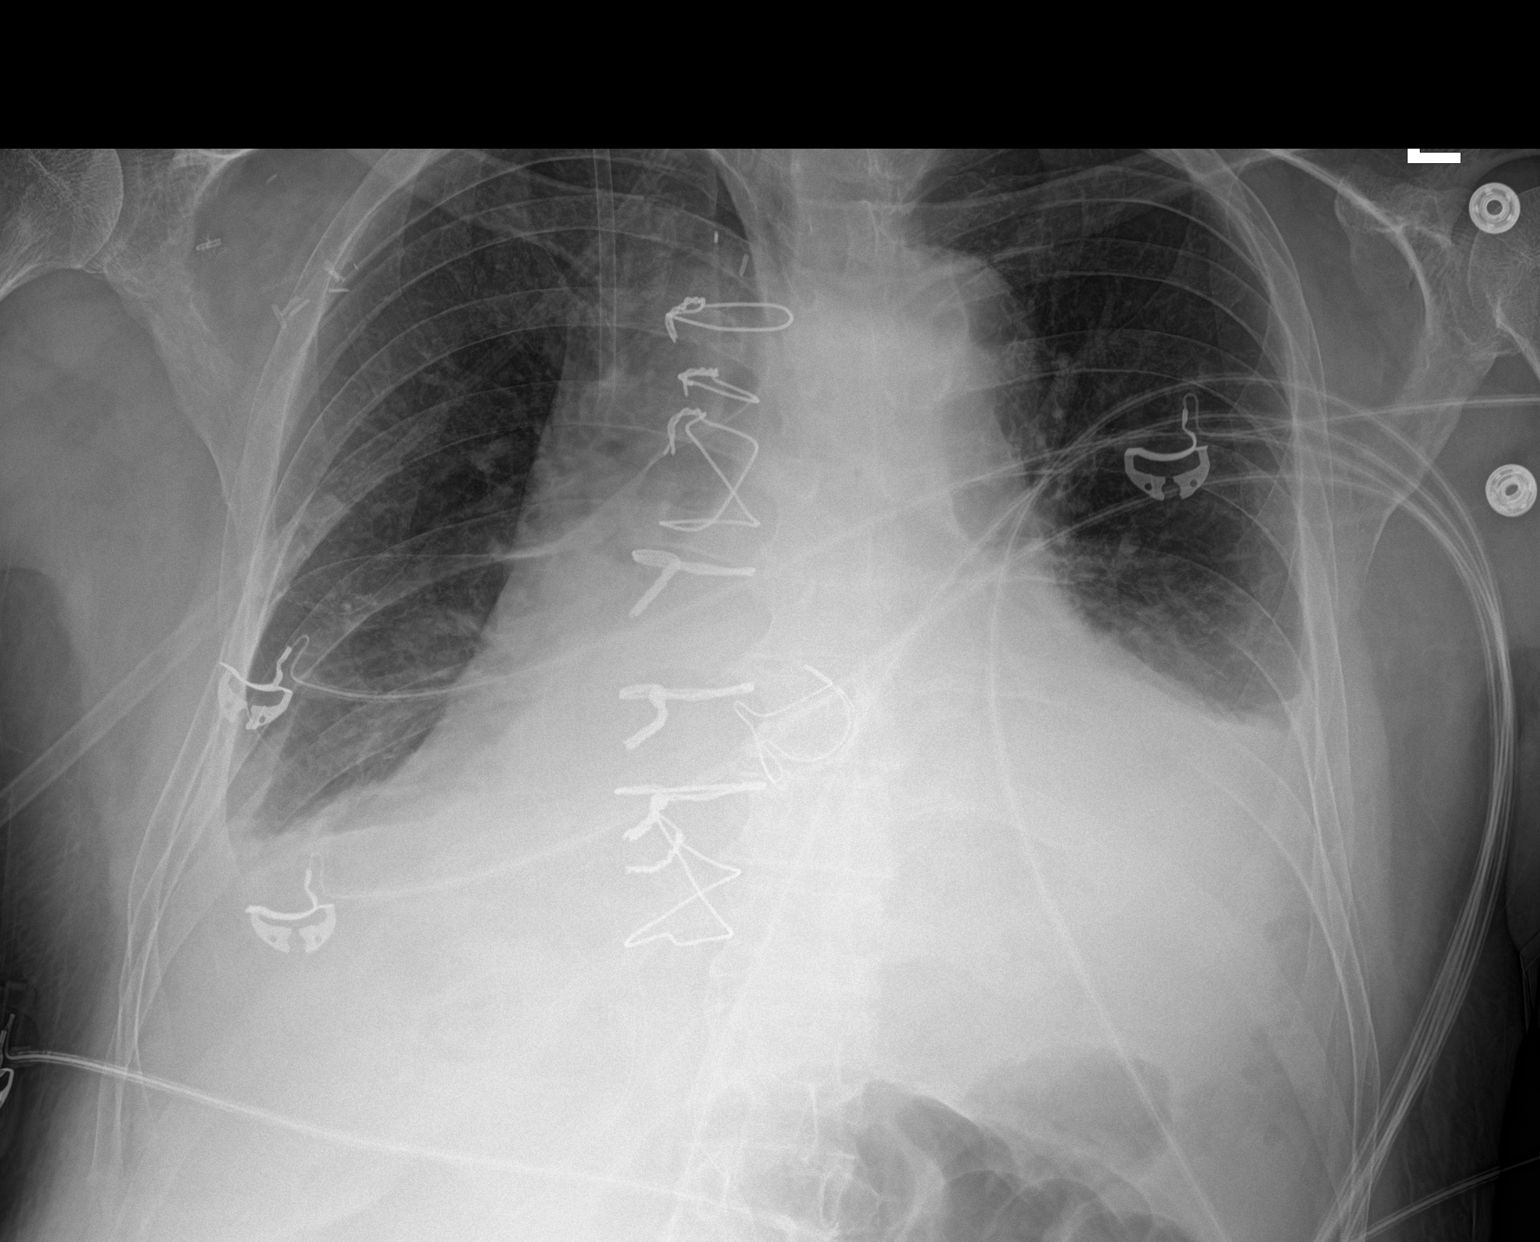

[1 of 1 positions shown; findings below may reference images not displayed]

FINDINGS: Patient is rotated to the right. Right IJ central venous sheath
unchanged with tip over the SVC. Lungs are hypoinflated with stable
left base opacification likely small to moderate effusion with
associated basilar atelectasis. Slight worsening small right
effusion with associated basilar atelectasis. No pneumothorax.
Stable cardiomegaly. Remainder of the exam is unchanged.
IMPRESSION: Stable moderate left effusion with associated basilar atelectasis.
Slight worsening small right effusion with associated right basilar
atelectasis.

Stable cardiomegaly.

Right IJ central venous sheath unchanged.

## 2021-05-27 IMAGING — DX DG CHEST 2V
2 series · 2 of 2 positions shown · non-contrast
Comparison: 08/30/2019

CLINICAL DATA: Status post coronary bypass grafting

EXAM:
CHEST - 2 VIEW

[chest pa]
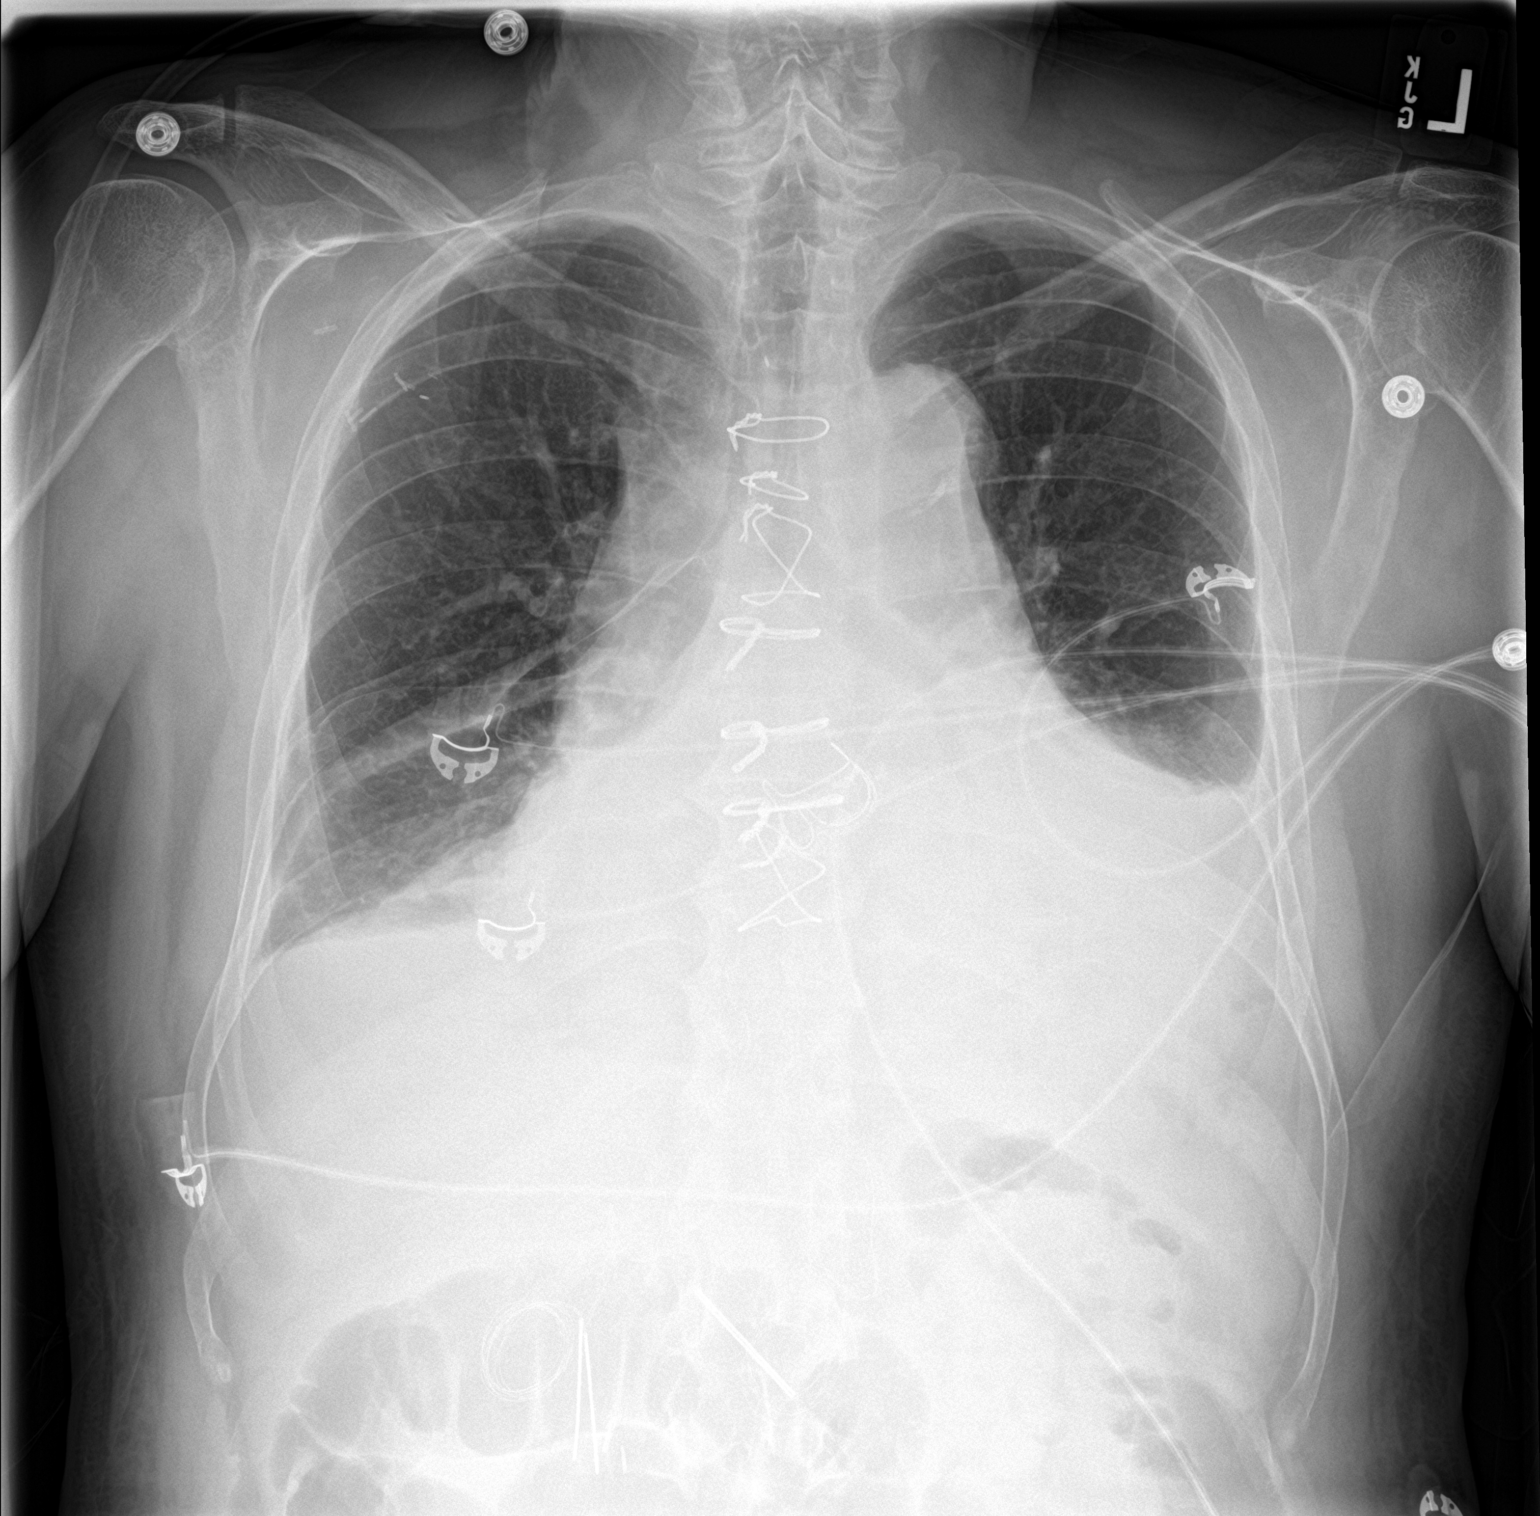

[chest lat]
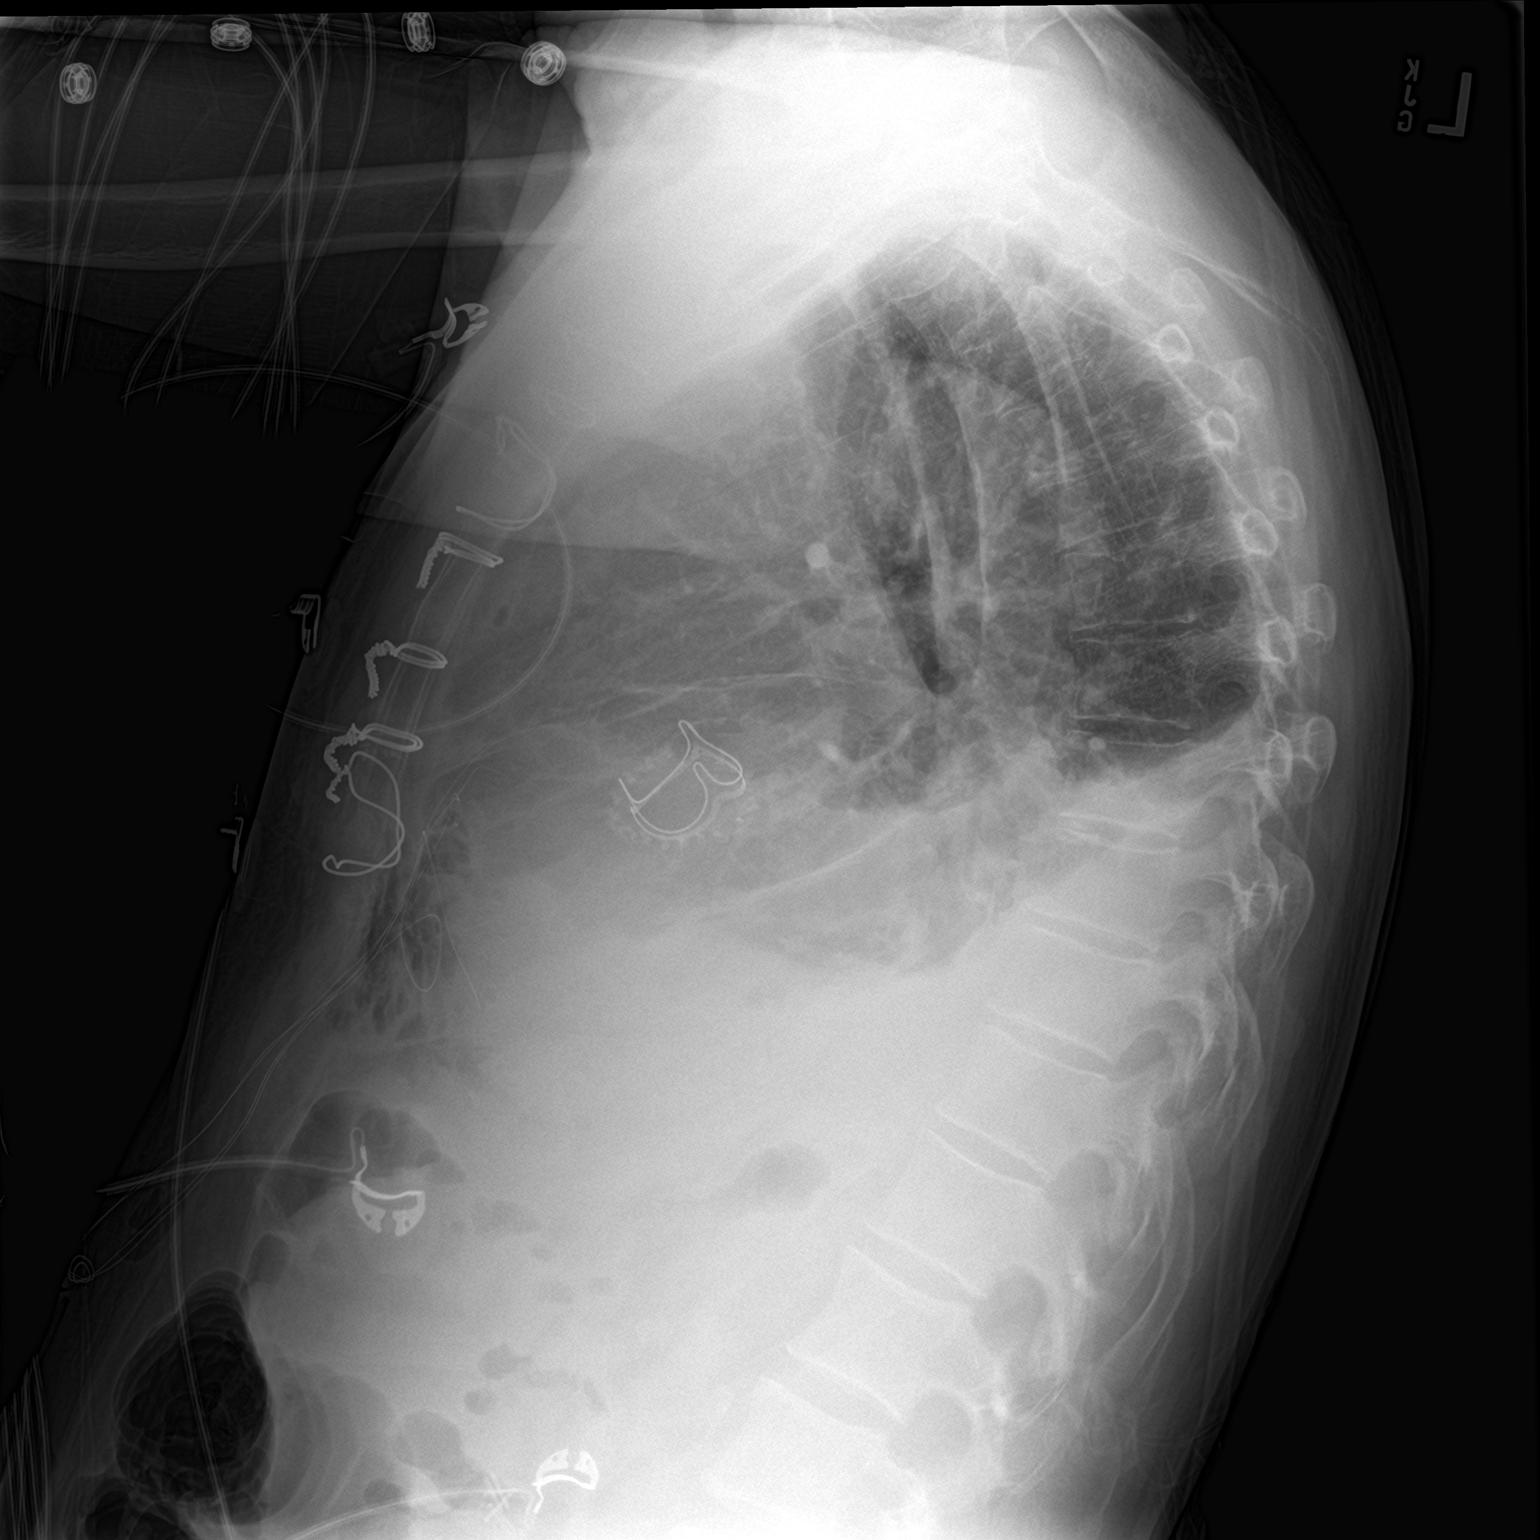

[2 of 2 positions shown; findings below may reference images not displayed]

FINDINGS: Cardiac shadow is stable but enlarged. Postsurgical changes are
again seen. Aortic calcifications are again noted and stable. Stable
left pleural effusion is noted. Underlying atelectasis is likely
present. Similar changes but to a much lesser degree are noted on
the right similar to that seen on the prior exam. No pneumothorax is
noted. No bony abnormality is seen. Right jugular sheath has been
removed in the interval.
IMPRESSION: Bilateral basilar atelectasis and effusions left significantly
greater than right but stable from the previous exam.

## 2021-07-27 ENCOUNTER — Ambulatory Visit: Payer: Medicare Other | Admitting: Adult Health

## 2021-09-15 ENCOUNTER — Telehealth: Payer: Self-pay | Admitting: Internal Medicine

## 2021-09-15 NOTE — Telephone Encounter (Signed)
I will keep an eye out for it.

## 2021-09-15 NOTE — Telephone Encounter (Signed)
Pt called in stating that he needs a medical form filled out for his employer [employer]. Pt will upload form into my chart

## 2021-10-12 ENCOUNTER — Encounter: Payer: Self-pay | Admitting: Internal Medicine

## 2021-11-04 ENCOUNTER — Encounter: Payer: Self-pay | Admitting: Internal Medicine

## 2021-11-14 ENCOUNTER — Ambulatory Visit (INDEPENDENT_AMBULATORY_CARE_PROVIDER_SITE_OTHER): Payer: Medicare Other | Admitting: Internal Medicine

## 2021-11-14 ENCOUNTER — Other Ambulatory Visit: Payer: Self-pay

## 2021-11-14 ENCOUNTER — Encounter: Payer: Self-pay | Admitting: Internal Medicine

## 2021-11-14 VITALS — BP 120/78 | HR 55 | Temp 97.7°F | Ht 66.0 in | Wt 165.0 lb

## 2021-11-14 DIAGNOSIS — L570 Actinic keratosis: Secondary | ICD-10-CM | POA: Insufficient documentation

## 2021-11-14 DIAGNOSIS — L821 Other seborrheic keratosis: Secondary | ICD-10-CM

## 2021-11-14 DIAGNOSIS — I2510 Atherosclerotic heart disease of native coronary artery without angina pectoris: Secondary | ICD-10-CM | POA: Diagnosis not present

## 2021-11-14 NOTE — Progress Notes (Signed)
Subjective:    Patient ID: Jeffrey Tucker, male    DOB: 05/07/1956, 65 y.o.   MRN: [id number]  HPI Here due to concerning skin lesions  Has spot in right frontal hairline--present for a month or so Also with spot on back Wife notes they have changed color Unsure how long the one on back has been there  No Rx  Current Outpatient Medications on File Prior to Visit  Medication Sig Dispense Refill   albuterol (VENTOLIN HFA) 108 (90 Base) MCG/ACT inhaler Inhale 2 puffs into the lungs 3 (three) times daily as needed for wheezing or shortness of breath.     aspirin EC 81 MG EC tablet Take 1 tablet (81 mg total) by mouth daily. 30 tablet 0   atorvastatin (LIPITOR) 40 MG tablet TAKE ONE TABLET BY MOUTH DAILY 90 tablet 3   Fluticasone-Salmeterol (ADVAIR) 100-50 MCG/DOSE AEPB INHALE ONE PUFF BY MOUTH TWICE A DAY 60 each 11   levocetirizine (XYZAL) 5 MG tablet Take 5 mg by mouth every evening.     levothyroxine (SYNTHROID) 125 MCG tablet Take 1 tablet (125 mcg total) by mouth daily. 90 tablet 3   No current facility-administered medications on file prior to visit.    Allergies  Allergen Reactions   Tetracycline Hcl Other (See Comments)    Joint pain (and stiffened them, also)   Beta Adrenergic Blockers     Patient experienced abdomen vasospasms with Metoprolol succinate/tartrate and carvedilol.   Metoprolol Tartrate     Asthma and cough   Amlodipine Other (See Comments)    10mg  dose causes lower extremity swelling. Pt can tolerate 5mg  dose.    Past Medical History:  Diagnosis Date   Allergy    allergic rhinitis   Aortic root dilatation (HCC)    a. s/p surgical repair 08/27/2019   Asthma    mostly in childhood   Bicuspid aortic valve    a.  Status post bioprosthetic replacement 08/27/2019   BPH (benign prostatic hypertrophy)    CAD (coronary artery disease)    a. s/p 2-v CABG 08/2019 (SVG-OM, SVG--rPDA) *Grafts subsequently ligated due to bleeding   GERD (gastroesophageal  reflux disease)    History of kidney stones    Hypertension    Hypertriglyceridemia    Hypothyroidism    Pericardial tamponade 09/25/2019   Severe aortic regurgitation    a. bicuspid aortic valve, b. s/p bio Bentall procedure 08/2019   Wears glasses     Past Surgical History:  Procedure Laterality Date   BENTALL PROCEDURE N/A 08/27/2019   Procedure: BENTALL PROCEDURE with a 27 mm KONECT Resilia Aortic Valved Conduit and 30 mm x 30 cm Hemashield Platinum straight graft.;  Surgeon: [facility], MD;  Location: [facility];  Service: Open Heart Surgery;  Laterality: N/A;   CARDIAC CATHETERIZATION  2006   [place]   CARDIOVASCULAR STRESS TEST  2006   [place]   CHOLECYSTECTOMY  2006   CORONARY ARTERY BYPASS GRAFT N/A 08/27/2019   Procedure: CORONARY ARTERY BYPASS GRAFTING (CABG) times 2 using right saphenous endoscopic vein harvesting to OM2 nad PDA.;  Surgeon: [facility], MD;  Location: [place];  Service: Open Heart Surgery;  Laterality: N/A;   CYSTOSCOPY W/ URETERAL STENT PLACEMENT  05/24/2017   Procedure: left;  Surgeon: Nickie Retort, MD;  Location: [facility];  Service: Urology;;   CYSTOSCOPY W/ URETERAL STENT PLACEMENT Left 06/11/2017   Procedure: CYSTOSCOPY WITH STENT REPLACEMENT;  Surgeon: Hollice Espy, MD;  Location: [facility];  Service: Urology;  Laterality: Left;   CYSTOSCOPY WITH BIOPSY Left 06/11/2017   Procedure: RENAL PELVIS TUMOR WITH BIOPSY WITH POSSIBLE LASER ABLATION;  Surgeon: Hollice Espy, MD;  Location: [facility];  Service: Urology;  Laterality: Left;   CYSTOSCOPY WITH STENT PLACEMENT Left 05/24/2017   Procedure: , cystoscopy,left ureteral stent placement and left ureteroscopy attempted, retrograde pylogram;  Surgeon: Nickie Retort, MD;  Location: [facility];  Service: Urology;  Laterality: Left;   EYE SURGERY Bilateral    Cataract Extraction with IOL   FALSE ANEURYSM REPAIR  10/29/2019   REPAIR FALSE ANEURYSM RIGHT FEMORAL    FALSE ANEURYSM REPAIR Right  10/29/2019   Procedure: REPAIR FALSE ANEURYSM RIGHT FEMORAL;  Surgeon: Marty Heck, MD;  Location: [place];  Service: Vascular;  Laterality: Right;   HEMATOMA EVACUATION N/A 09/25/2019   Procedure: Evacuation Hematoma - Mediastinal Ligation of saphenous vein graft x two with Cardiopulmonary Bypass;  Surgeon: Rexene [facility]ts, MD;  Location: [place];  Service: Thoracic;  Laterality: N/A;   IR THORACENTESIS ASP PLEURAL SPACE W/IMG GUIDE  09/02/2019   RIGHT/LEFT HEART CATH AND CORONARY ANGIOGRAPHY N/A 08/21/2019   Procedure: RIGHT/LEFT HEART CATH AND CORONARY ANGIOGRAPHY;  Surgeon: Minna Merritts, MD;  Location: [place] [facility];  Service: Cardiovascular;  Laterality: N/A;   STERNOTOMY N/A 09/25/2019   Procedure: REDO STERNOTOMY;  Surgeon: Rexene Alberts, MD;  Location: [place];  Service: Thoracic;  Laterality: N/A;   TEE WITHOUT CARDIOVERSION N/A 08/27/2019   Procedure: TRANSESOPHAGEAL ECHOCARDIOGRAM (TEE);  Surgeon: [facility], MD;  Location: [place];  Service: Open Heart Surgery;  Laterality: N/A;   TEE WITHOUT CARDIOVERSION N/A 09/25/2019   Procedure: Transesophageal Echocardiogram (Tee);  Surgeon: Rexene [facility]ts, MD;  Location: [place];  Service: Thoracic;  Laterality: N/A;   URETEROSCOPY WITH HOLMIUM LASER LITHOTRIPSY Left 06/11/2017   Procedure: URETEROSCOPY WITH HOLMIUM LASER LITHOTRIPSY;  Surgeon: Hollice Espy, MD;  Location: [facility];  Service: Urology;  Laterality: Left;   VEIN LIGATION AND STRIPPING Bilateral 1997    Family History  Problem Relation Age of Onset   Alcohol abuse Mother    Asthma Mother    Alcohol abuse Father    Prostate cancer Father    Liver cancer Paternal Grandmother    Colon cancer Neg Hx    Coronary artery disease Neg Hx    Hypertension Neg Hx    Diabetes Neg Hx    Bladder Cancer Neg Hx    Kidney cancer Neg Hx     Social History   Socioeconomic History   Marital status: Married    Spouse name: Manolo Bosket   Number of children: 2    Years of education: Not on file   Highest education level: Not on file  Occupational History   Occupation: Sales promotion account executive   Occupation:      Comment:     Occupation: Adult nurse estate    Comment: Retired  Tobacco Use   Smoking status: Former    Types: Cigarettes    Quit date: 11/26/1978    Years since quitting: 42.9   Smokeless tobacco: Never   Tobacco comments:    social smoked till 1980's  Vaping Use   Vaping Use: Never used  Substance and Sexual Activity   Alcohol use: No    Alcohol/week: 0.0 standard drinks    Comment: none since 1984   Drug use: No   Sexual activity: Not on file  Other Topics Concern   Not on  file  Social History Narrative   Has living will   Wife is health care POA---friends Joe and Thompson Grayer are alternates   Would accept resuscitation   Would accept feeding tube at first---but not if prolonged    Social Determinants of Health   Financial Resource Strain: Not on file  Food Insecurity: Not on file  Transportation Needs: Not on file  Physical Activity: Not on file  Stress: Not on file  Social Connections: Not on file  Intimate Partner Violence: Not on file   Review of Systems No history of skin cancer No excessive sun exposure     Objective:   Physical Exam Constitutional:      Appearance: Normal appearance.  Skin:    Comments: 4mm scaly lesion in hairline of right forehead  61mm seb keratosis on back  Neurological:     Mental Status: He is alert.           Assessment & Plan:

## 2021-11-14 NOTE — Assessment & Plan Note (Signed)
Vs early cancer Discussed pros/cons of cryotherapy--he gives verbal consent  Cryotherapy with Histofreezer-- 40 seconds x 2 Tolerated well Discussed home care  Asked him to establish with derm and be seen soon for recheck He will call Dr Alveria Apley practice

## 2021-11-14 NOTE — Assessment & Plan Note (Signed)
Reassured about this lesion No action needed

## 2021-11-16 ENCOUNTER — Encounter: Payer: Self-pay | Admitting: Internal Medicine

## 2021-11-21 ENCOUNTER — Encounter: Payer: Self-pay | Admitting: Internal Medicine

## 2021-11-28 ENCOUNTER — Encounter: Payer: Self-pay | Admitting: Internal Medicine

## 2021-11-29 MED ORDER — KETOCONAZOLE 2 % EX SHAM: 1.0000 "application " | MEDICATED_SHAMPOO | CUTANEOUS | 5 refills | Status: DC

## 2021-11-29 MED ORDER — BETAMETHASONE DIPROPIONATE AUG 0.05 % EX LOTN: TOPICAL_LOTION | Freq: Two times a day (BID) | CUTANEOUS | 2 refills | Status: DC | PRN

## 2021-12-21 ENCOUNTER — Encounter: Payer: Self-pay | Admitting: Internal Medicine

## 2022-02-01 ENCOUNTER — Ambulatory Visit (INDEPENDENT_AMBULATORY_CARE_PROVIDER_SITE_OTHER): Payer: Medicare Other | Admitting: Dermatology

## 2022-02-01 ENCOUNTER — Other Ambulatory Visit: Payer: Self-pay

## 2022-02-01 DIAGNOSIS — D229 Melanocytic nevi, unspecified: Secondary | ICD-10-CM

## 2022-02-01 DIAGNOSIS — D1800 Hemangioma unspecified site: Secondary | ICD-10-CM | POA: Diagnosis not present

## 2022-02-01 DIAGNOSIS — L814 Other melanin hyperpigmentation: Secondary | ICD-10-CM

## 2022-02-01 DIAGNOSIS — L821 Other seborrheic keratosis: Secondary | ICD-10-CM

## 2022-02-01 DIAGNOSIS — L578 Other skin changes due to chronic exposure to nonionizing radiation: Secondary | ICD-10-CM

## 2022-02-01 DIAGNOSIS — Z1283 Encounter for screening for malignant neoplasm of skin: Secondary | ICD-10-CM | POA: Diagnosis not present

## 2022-02-01 DIAGNOSIS — L820 Inflamed seborrheic keratosis: Secondary | ICD-10-CM

## 2022-02-01 NOTE — Patient Instructions (Addendum)

## 2022-02-01 NOTE — Progress Notes (Unsigned)
° °  New Patient Visit  Subjective  Jeffrey Tucker is a 66 y.o. male who presents for the following: Annual Exam (The patient has spots, moles and lesions to be evaluated, some may be new or changing and the patient has concerns that these could be cancer. ).    The following portions of the chart were reviewed this encounter and updated as appropriate:       Review of Systems:  No other skin or systemic complaints except as noted in HPI or Assessment and Plan.  Objective  Well appearing patient in no apparent distress; mood and affect are within normal limits.  All skin waist up examined.  right anterior scalp at anterior hairline x 1 Stuck-on, waxy, tan-brown papule    Assessment & Plan  Inflamed seborrheic keratosis right anterior scalp at anterior hairline x 1  Reassured benign age-related growth.  Recommend observation.  Discussed cryotherapy if spot(s) become irritated or inflamed.   Destruction of lesion - right anterior scalp at anterior hairline x 1 Complexity: simple   Destruction method: cryotherapy   Informed consent: discussed and consent obtained   Timeout:  patient name, date of birth, surgical site, and procedure verified Lesion destroyed using liquid nitrogen: Yes   Region frozen until ice ball extended beyond lesion: Yes   Outcome: patient tolerated procedure well with no complications   Post-procedure details: wound care instructions given     Return in about 3 months (around 05/04/2022) for ISK .   I, Marye Round, CMA, am acting as scribe for Sarina Ser, MD .

## 2022-02-07 ENCOUNTER — Encounter: Payer: Self-pay | Admitting: Dermatology

## 2022-03-12 ENCOUNTER — Telehealth: Payer: Self-pay | Admitting: Internal Medicine

## 2022-03-12 MED ORDER — ALBUTEROL SULFATE HFA 108 (90 BASE) MCG/ACT IN AERS: 2.0000 | INHALATION_SPRAY | Freq: Three times a day (TID) | RESPIRATORY_TRACT | 0 refills | Status: DC | PRN

## 2022-03-12 NOTE — Addendum Note (Signed)
Addended by: Viviana Simpler I on: 03/12/2022 05:15 PM ? ? Modules accepted: Orders ? ?

## 2022-03-12 NOTE — Telephone Encounter (Signed)
?  Encourage patient to contact the pharmacy for refills or they can request refills through [facility] ? ?LAST APPOINTMENT DATE:  Please schedule appointment if longer than 1 year ? ?NEXT APPOINTMENT DATE: ? ?MEDICATION:albuterol (VENTOLIN HFA) 108 (90 Base) MCG/ACT inhaler ? ?Is the patient out of medication?  ? ?PHARMACY:[facility] [id number] - [address], [place] ? ?Let patient know to contact pharmacy at the end of the day to make sure medication is ready. ? ?Please notify patient to allow 48-72 hours to process ? ?CLINICAL FILLS OUT ALL BELOW:  ? ?LAST REFILL: ? ?QTY: ? ?REFILL DATE: ? ? ? ?OTHER COMMENTS:  ? ? ?Okay for refill? ? ?Please advise ? ? ?  ?

## 2022-03-12 NOTE — Telephone Encounter (Signed)
Pt hasn't had this filled since 2020 is this okay to refill ,please advise  ?

## 2022-03-28 NOTE — Progress Notes (Deleted)
Cardiology Office Note  Date:  03/28/2022   ID:  Jeffrey Tucker, DOB Jan 22, 1956, MRN [id number]  PCP:  Jeffrey Carbon, MD   No chief complaint on file.   HPI:  Jeffrey Tucker is a very pleasant 66 year old gentleman with  moderate aortic valve regurgitation, Echo 2020:  dilatation of the aortic root 4.9 cm and of the ascending aorta 4.4 cm, aortic arch 3.7 cm. bicuspid aortic valve.   biological Bentall aortic root replacement and coronary artery bypass grafting x2 on August 27, 2019 He presents for follow-up after aortic valve replacement aortic root replacement, CABG, readmission for bleed  Last Tucker in clinic by myself March 2022   Lab work reviewed LDL 66, total cholesterol 132    In follow-up today reports he is doing very well Retired, Regular exercise program Denies chest pain concerning for angina  Chronic bradycardia, no sx  Doing upper body exercise Walking 10 miles a week  Lab work reviewed TSH 42,  Thyroid supplement increased 150 , TSH 0.5  Weight up, Trying to lose weight  Compliant with lipitor Writing a book on retirement  Prior stress test with no ischemia reviewed again  EKG personally reviewed by myself on todays visit Normal sinus rhythm nonspecific ST-T wave abnormality  Lab Results  Component Value Date   CHOL 132 05/22/2021   HDL 29.30 (L) 05/22/2021   LDLCALC 38 02/16/2021   TRIG 261.0 (H) 05/22/2021   Prior cardiac history reviewed   biological Bentall aortic root replacement and coronary artery bypass grafting x2 on August 27, 2019 by Jeffrey Tucker.   discharged from the hospital on the seventh postoperative day.  readmitted to the hospital September 16, 2019 with severe chest pain and tachycardia.  CT scan and transthoracic echocardiogram revealed slight increase in the amount of pericardial effusion and/or blood in the mediastinum.  brief episode of atrial fibrillation , treated with amiodarone. September 23, 2019 had a brief episode of  hypotension which resolved without significant intervention.   developed bloody drainage from his sternal wound.   wife found him unresponsive.  She began CPR.   EMS was called and brought him directly to the emergency room where he was unresponsive on arrival  intubated and placed on mechanical ventilation. He was Tucker urgently by Jeffrey Tucker and was taken urgently to the Operating Room for exploration and repair.ligation of grafts  CT scan to have a right femoral pseudoaneurysm and vascular surgical consultation was obtained.    pseudoaneurysm measures 2 x 1 cm with a short neck.  surgical repair    PMH:   has a past medical history of Allergy, Aortic root dilatation (Jeffrey Tucker), Asthma, Bicuspid aortic valve, BPH (benign prostatic hypertrophy), CAD (coronary artery disease), GERD (gastroesophageal reflux disease), History of kidney stones, Hypertension, Hypertriglyceridemia, Hypothyroidism, Pericardial tamponade (09/25/2019), Severe aortic regurgitation, and Wears glasses.  PSH:    Past Surgical History:  Procedure Laterality Date   BENTALL PROCEDURE N/A 08/27/2019   Procedure: BENTALL PROCEDURE with a 27 mm KONECT Resilia Aortic Valved Conduit and 30 mm x 30 cm Hemashield Platinum straight graft.;  Surgeon: Jeffrey Olds, MD;  Location: [facility];  Service: Open Heart Surgery;  Laterality: N/A;   CARDIAC CATHETERIZATION  2006   [place]   CARDIOVASCULAR STRESS TEST  2006   [place]   CHOLECYSTECTOMY  2006   CORONARY ARTERY BYPASS GRAFT N/A 08/27/2019   Procedure: CORONARY ARTERY BYPASS GRAFTING (CABG) times 2 using right saphenous endoscopic vein harvesting to OM2 nad PDA.;  Surgeon: Jeffrey Olds, MD;  Location: [place];  Service: Open Heart Surgery;  Laterality: N/A;   CYSTOSCOPY W/ URETERAL STENT PLACEMENT  05/24/2017   Procedure: left;  Surgeon: Jeffrey Retort, MD;  Location: [facility];  Service: Urology;;   CYSTOSCOPY W/ URETERAL STENT PLACEMENT Left 06/11/2017   Procedure: CYSTOSCOPY  WITH STENT REPLACEMENT;  Surgeon: Jeffrey Espy, MD;  Location: [facility];  Service: Urology;  Laterality: Left;   CYSTOSCOPY WITH BIOPSY Left 06/11/2017   Procedure: RENAL PELVIS TUMOR WITH BIOPSY WITH POSSIBLE LASER ABLATION;  Surgeon: Jeffrey Espy, MD;  Location: [facility];  Service: Urology;  Laterality: Left;   CYSTOSCOPY WITH STENT PLACEMENT Left 05/24/2017   Procedure: , cystoscopy,left ureteral stent placement and left ureteroscopy attempted, retrograde pylogram;  Surgeon: Jeffrey Retort, MD;  Location: [facility];  Service: Urology;  Laterality: Left;   EYE SURGERY Bilateral    Cataract Extraction with IOL   FALSE ANEURYSM REPAIR  10/29/2019   REPAIR FALSE ANEURYSM RIGHT FEMORAL    FALSE ANEURYSM REPAIR Right 10/29/2019   Procedure: REPAIR FALSE ANEURYSM RIGHT FEMORAL;  Surgeon: Jeffrey Heck, MD;  Location: [place];  Service: Vascular;  Laterality: Right;   HEMATOMA EVACUATION N/A 09/25/2019   Procedure: Evacuation Hematoma - Mediastinal Ligation of saphenous vein graft x two with Cardiopulmonary Bypass;  Surgeon: Jeffrey Alberts, MD;  Location: [place];  Service: Thoracic;  Laterality: N/A;   IR THORACENTESIS ASP PLEURAL SPACE W/IMG GUIDE  09/02/2019   RIGHT/LEFT HEART CATH AND CORONARY ANGIOGRAPHY N/A 08/21/2019   Procedure: RIGHT/LEFT HEART CATH AND CORONARY ANGIOGRAPHY;  Surgeon: Jeffrey Merritts, MD;  Location: [place] [facility];  Service: Cardiovascular;  Laterality: N/A;   STERNOTOMY N/A 09/25/2019   Procedure: REDO STERNOTOMY;  Surgeon: Jeffrey Alberts, MD;  Location: [place];  Service: Thoracic;  Laterality: N/A;   TEE WITHOUT CARDIOVERSION N/A 08/27/2019   Procedure: TRANSESOPHAGEAL ECHOCARDIOGRAM (TEE);  Surgeon: Jeffrey Olds, MD;  Location: [place];  Service: Open Heart Surgery;  Laterality: N/A;   TEE WITHOUT CARDIOVERSION N/A 09/25/2019   Procedure: Transesophageal Echocardiogram (Tee);  Surgeon: Jeffrey Alberts, MD;  Location: [place];  Service: Thoracic;   Laterality: N/A;   URETEROSCOPY WITH HOLMIUM LASER LITHOTRIPSY Left 06/11/2017   Procedure: URETEROSCOPY WITH HOLMIUM LASER LITHOTRIPSY;  Surgeon: Jeffrey Espy, MD;  Location: [facility];  Service: Urology;  Laterality: Left;   VEIN LIGATION AND STRIPPING Bilateral 1997    Current Outpatient Medications  Medication Sig Dispense Refill   albuterol (VENTOLIN HFA) 108 (90 Base) MCG/ACT inhaler Inhale 2 puffs into the lungs 3 (three) times daily as needed for wheezing or shortness of breath. 18 g 0   aspirin EC 81 MG EC tablet Take 1 tablet (81 mg total) by mouth daily. 30 tablet 0   atorvastatin (LIPITOR) 40 MG tablet TAKE ONE TABLET BY MOUTH DAILY 90 tablet 3   betamethasone, augmented, (DIPROLENE) 0.05 % lotion Apply topically 2 (two) times daily as needed. 60 mL 2   Fluticasone-Salmeterol (ADVAIR) 100-50 MCG/DOSE AEPB INHALE ONE PUFF BY MOUTH TWICE A DAY 60 each 11   ketoconazole (NIZORAL) 2 % shampoo Apply 1 application topically once a week. 120 mL 5   levocetirizine (XYZAL) 5 MG tablet Take 5 mg by mouth every evening.     levothyroxine (SYNTHROID) 125 MCG tablet Take 1 tablet (125 mcg total) by mouth daily. 90 tablet 3   No current facility-administered medications for this visit.     Allergies:  Tetracycline hcl, Beta adrenergic blockers, Metoprolol tartrate, and Amlodipine   Social History:  The patient  reports that he quit smoking about 43 years ago. His smoking use included cigarettes. He has never used smokeless tobacco. He reports that he does not drink alcohol and does not use drugs.   Family History:   family history includes Alcohol abuse in his father and mother; Asthma in his mother; Liver cancer in his paternal grandmother; Prostate cancer in his father.    Review of Systems: Review of Systems  Constitutional: Negative.   HENT: Negative.    Respiratory: Negative.    Cardiovascular: Negative.   Gastrointestinal: Negative.   Musculoskeletal: Negative.    Neurological: Negative.   Psychiatric/Behavioral: Negative.    All other systems reviewed and are negative.  PHYSICAL EXAM: VS:  There were no vitals taken for this visit. , BMI There is no height or weight on file to calculate BMI. Constitutional:  oriented to person, place, and time. No distress.  HENT:  Head: Grossly normal Eyes:  no discharge. No scleral icterus.  Neck: No JVD, no carotid bruits  Cardiovascular: Regular rate and rhythm, no murmurs appreciated Pulmonary/Chest: Clear to auscultation bilaterally, no wheezes or rails Abdominal: Soft.  no distension.  no tenderness.  Musculoskeletal: Normal range of motion Neurological:  normal muscle tone. Coordination normal. No atrophy Skin: Skin warm and dry Psychiatric: normal affect, pleasant   Recent Labs: 05/22/2021: ALT 22; BUN 20; Creatinine, Ser 0.99; Hemoglobin 13.5; Platelets 235.0; Potassium 4.1; Sodium 141; TSH 0.07    Lipid Panel Lab Results  Component Value Date   CHOL 132 05/22/2021   HDL 29.30 (L) 05/22/2021   LDLCALC 38 02/16/2021   TRIG 261.0 (H) 05/22/2021      Wt Readings from Last 3 Encounters:  11/14/21 165 lb (74.8 kg)  05/22/21 159 lb (72.1 kg)  03/21/21 163 lb (73.9 kg)     ASSESSMENT AND PLAN:  Bicuspid aortic valve -  Aortic valve insufficiency, Bioprosthetic valve  Echo pending  Cardiomyopathy In setting of cardiac arrest post surgery, low ejection fraction at that time Repeat echo ordered  Ascending aorta dilatation ([place]) - Plan: EKG 12- Bentall aortic root replacement Echo ordered  CAD with stable angina Currently with no symptoms of angina. No further workup at this time. Continue current medication regimen. Prior stress test low risk  Paroxysmal atrial fibrillation  Maintaining normal sinus rhythm   Total encounter time more than 25 minutes  Greater than 50% was spent in counseling and coordination of care with the patient   No orders of the defined types were placed  in this encounter.    Signed, Esmond Plants, M.D., Ph.D. 03/28/2022  [facility] [place], [place] [phone]

## 2022-03-30 ENCOUNTER — Ambulatory Visit: Payer: Medicare Other | Admitting: Cardiovascular Disease

## 2022-03-30 DIAGNOSIS — I10 Essential (primary) hypertension: Secondary | ICD-10-CM

## 2022-03-30 DIAGNOSIS — Z953 Presence of xenogenic heart valve: Secondary | ICD-10-CM

## 2022-03-30 DIAGNOSIS — Z951 Presence of aortocoronary bypass graft: Secondary | ICD-10-CM

## 2022-03-30 DIAGNOSIS — I7121 Aneurysm of the ascending aorta, without rupture: Secondary | ICD-10-CM

## 2022-03-30 DIAGNOSIS — I25118 Atherosclerotic heart disease of native coronary artery with other forms of angina pectoris: Secondary | ICD-10-CM

## 2022-03-30 DIAGNOSIS — I480 Paroxysmal atrial fibrillation: Secondary | ICD-10-CM

## 2022-03-30 DIAGNOSIS — I428 Other cardiomyopathies: Secondary | ICD-10-CM

## 2022-03-30 DIAGNOSIS — Q231 Congenital insufficiency of aortic valve: Secondary | ICD-10-CM

## 2022-05-01 ENCOUNTER — Other Ambulatory Visit: Payer: Self-pay | Admitting: Internal Medicine

## 2022-05-07 ENCOUNTER — Ambulatory Visit (INDEPENDENT_AMBULATORY_CARE_PROVIDER_SITE_OTHER): Payer: Medicare Other | Admitting: Dermatology

## 2022-05-07 ENCOUNTER — Encounter: Payer: Self-pay | Admitting: Dermatology

## 2022-05-07 DIAGNOSIS — L578 Other skin changes due to chronic exposure to nonionizing radiation: Secondary | ICD-10-CM

## 2022-05-07 DIAGNOSIS — L820 Inflamed seborrheic keratosis: Secondary | ICD-10-CM

## 2022-05-07 DIAGNOSIS — L219 Seborrheic dermatitis, unspecified: Secondary | ICD-10-CM

## 2022-05-07 MED ORDER — MOMETASONE FUROATE 0.1 % EX SOLN: CUTANEOUS | 6 refills | Status: DC

## 2022-05-07 NOTE — Progress Notes (Signed)
   Follow-Up Visit   Subjective  Jeffrey Tucker is a 66 y.o. male who presents for the following: Follow-up (3 month follow up - Inflamed SK of right ant scalp at ant hairline treated with LN2) and Other (Scale and itch of scalp - PCP gave him Ketoconazole shampoo that he uses once or twice a month and Betamethasone lotion that he uses PRN. Condition only started ~ a month and half ago).  The following portions of the chart were reviewed this encounter and updated as appropriate:   Tobacco  Allergies  Meds  Problems  Med Hx  Surg Hx  Fam Hx     Review of Systems:  No other skin or systemic complaints except as noted in HPI or Assessment and Plan.  Objective  Well appearing patient in no apparent distress; mood and affect are within normal limits.  A focused examination was performed including scalp. Relevant physical exam findings are noted in the Assessment and Plan.  Scalp Pinkness  Right ant scalp at ant hairline Clear today   Assessment & Plan  Seborrheic dermatitis Scalp  Seborrheic Dermatitis  -  is a chronic persistent rash characterized by pinkness and scaling most commonly of the mid face but also can occur on the scalp (dandruff), ears; mid chest, mid back and groin.  It tends to be exacerbated by stress and cooler weather.  People who have neurologic disease may experience new onset or exacerbation of existing seborrheic dermatitis.  The condition is not curable but treatable and can be controlled.  Increase Ketoconazole 2% shampoo to 2 times per week.  The more the patient shampoos the better his scalp will tend to be. D/C Betamethasone lotion.  Start Mometasone lotion 2 times per week to scalp at bedtime.  Washout in a.m.  mometasone (ELOCON) 0.1 % lotion - Scalp Apply topically 2 (two) times a week.  Inflamed seborrheic keratosis Right ant scalp at ant hairline  Resolved  Actinic Damage - chronic, secondary to cumulative UV radiation exposure/sun exposure  over time - diffuse scaly erythematous macules with underlying dyspigmentation - Recommend daily broad spectrum sunscreen SPF 30+ to sun-exposed areas, reapply every 2 hours as needed.  - Recommend staying in the shade or wearing long sleeves, sun glasses (UVA+UVB protection) and wide brim hats (4-inch brim around the entire circumference of the hat). - Call for new or changing lesions.  Return if symptoms worsen or fail to improve.  I, Ashok Cordia, CMA, am acting as scribe for Sarina Ser, MD . Documentation: I have reviewed the above documentation for accuracy and completeness, and I agree with the above.  Sarina Ser, MD

## 2022-05-07 NOTE — Patient Instructions (Signed)
Due to recent changes in healthcare laws, you may see results of your pathology and/or laboratory studies on MyChart before the doctors have had a chance to review them. We understand that in some cases there may be results that are confusing or concerning to you. Please understand that not all results are received at the same time and often the doctors may need to interpret multiple results in order to provide you with the best plan of care or course of treatment. Therefore, we ask that you please give us 2 business days to thoroughly review all your results before contacting the office for clarification. Should we see a critical lab result, you will be contacted sooner.   If You Need Anything After Your Visit  If you have any questions or concerns for your doctor, please call our main line at [phone] and press option 4 to reach your doctor's medical assistant. If no one answers, please leave a voicemail as directed and we will return your call as soon as possible. Messages left after 4 pm will be answered the following business day.   You may also send us a message via MyChart. We typically respond to MyChart messages within 1-2 business days.  For prescription refills, please ask your pharmacy to contact our office. Our fax number is [phone].  If you have an urgent issue when the clinic is closed that cannot wait until the next business day, you can page your doctor at the number below.    Please note that while we do our best to be available for urgent issues outside of office hours, we are not available 24/7.   If you have an urgent issue and are unable to reach us, you may choose to seek medical care at your doctor's office, retail clinic, urgent care center, or emergency room.  If you have a medical emergency, please immediately call 911 or go to the emergency department.  Pager Numbers  - Dr. Kowalski: [phone]  - Dr. Moye: [phone]  - Dr. Stewart:  [phone]  In the event of inclement weather, please call our main line at [phone] for an update on the status of any delays or closures.  Dermatology Medication Tips: Please keep the boxes that topical medications come in in order to help keep track of the instructions about where and how to use these. Pharmacies typically print the medication instructions only on the boxes and not directly on the medication tubes.   If your medication is too expensive, please contact our office at [phone] option 4 or send us a message through MyChart.   We are unable to tell what your co-pay for medications will be in advance as this is different depending on your insurance coverage. However, we may be able to find a substitute medication at lower cost or fill out paperwork to get insurance to cover a needed medication.   If a prior authorization is required to get your medication covered by your insurance company, please allow us 1-2 business days to complete this process.  Drug prices often vary depending on where the prescription is filled and some pharmacies may offer cheaper prices.  The website www.goodrx.com contains coupons for medications through different pharmacies. The prices here do not account for what the cost may be with help from insurance (it may be cheaper with your insurance), but the website can give you the price if you did not use any insurance.  - You can print the associated coupon and take it with   your prescription to the pharmacy.  - You may also stop by our office during regular business hours and pick up a GoodRx coupon card.  - If you need your prescription sent electronically to a different pharmacy, notify our office through [place] MyChart or by phone at [phone] option 4.     Si Usted Necesita Algo Despus de Su Visita  Tambin puede enviarnos un mensaje a travs de MyChart. Por lo general respondemos a los mensajes de MyChart en el transcurso de 1 a 2  das hbiles.  Para renovar recetas, por favor pida a su farmacia que se ponga en contacto con nuestra oficina. Nuestro nmero de fax es el [phone].  Si tiene un asunto urgente cuando la clnica est cerrada y que no puede esperar hasta el siguiente da hbil, puede llamar/localizar a su doctor(a) al nmero que aparece a continuacin.   Por favor, tenga en cuenta que aunque hacemos todo lo posible para estar disponibles para asuntos urgentes fuera del horario de oficina, no estamos disponibles las 24 horas del da, los 7 das de la semana.   Si tiene un problema urgente y no puede comunicarse con nosotros, puede optar por buscar atencin mdica  en el consultorio de su doctor(a), en una clnica privada, en un centro de atencin urgente o en una sala de emergencias.  Si tiene una emergencia mdica, por favor llame inmediatamente al 911 o vaya a la sala de emergencias.  Nmeros de bper  - Dr. Kowalski: [phone]  - Dra. Moye: [phone]  - Dra. Stewart: [phone]  En caso de inclemencias del tiempo, por favor llame a nuestra lnea principal al [phone] para una actualizacin sobre el estado de cualquier retraso o cierre.  Consejos para la medicacin en dermatologa: Por favor, guarde las cajas en las que vienen los medicamentos de uso tpico para ayudarle a seguir las instrucciones sobre dnde y cmo usarlos. Las farmacias generalmente imprimen las instrucciones del medicamento slo en las cajas y no directamente en los tubos del medicamento.   Si su medicamento es muy caro, por favor, pngase en contacto con nuestra oficina llamando al [phone] y presione la opcin 4 o envenos un mensaje a travs de MyChart.   No podemos decirle cul ser su copago por los medicamentos por adelantado ya que esto es diferente dependiendo de la cobertura de su seguro. Sin embargo, es posible que podamos encontrar un medicamento sustituto a menor costo o llenar un formulario para que el  seguro cubra el medicamento que se considera necesario.   Si se requiere una autorizacin previa para que su compaa de seguros cubra su medicamento, por favor permtanos de 1 a 2 das hbiles para completar este proceso.  Los precios de los medicamentos varan con frecuencia dependiendo del lugar de dnde se surte la receta y alguna farmacias pueden ofrecer precios ms baratos.  El sitio web www.goodrx.com tiene cupones para medicamentos de diferentes farmacias. Los precios aqu no tienen en cuenta lo que podra costar con la ayuda del seguro (puede ser ms barato con su seguro), pero el sitio web puede darle el precio si no utiliz ningn seguro.  - Puede imprimir el cupn correspondiente y llevarlo con su receta a la farmacia.  - Tambin puede pasar por nuestra oficina durante el horario de atencin regular y recoger una tarjeta de cupones de GoodRx.  - Si necesita que su receta se enve electrnicamente a una farmacia diferente, informe a nuestra oficina a travs de MyChart de [facility]   o por telfono llamando al [phone] y presione la opcin 4.  

## 2022-05-11 ENCOUNTER — Ambulatory Visit (INDEPENDENT_AMBULATORY_CARE_PROVIDER_SITE_OTHER): Payer: Medicare Other | Admitting: Cardiovascular Disease

## 2022-05-11 ENCOUNTER — Encounter: Payer: Self-pay | Admitting: Cardiovascular Disease

## 2022-05-11 VITALS — BP 128/80 | HR 57 | Ht 66.0 in | Wt 165.1 lb

## 2022-05-11 DIAGNOSIS — I25118 Atherosclerotic heart disease of native coronary artery with other forms of angina pectoris: Secondary | ICD-10-CM | POA: Diagnosis not present

## 2022-05-11 DIAGNOSIS — I351 Nonrheumatic aortic (valve) insufficiency: Secondary | ICD-10-CM | POA: Diagnosis not present

## 2022-05-11 DIAGNOSIS — Z951 Presence of aortocoronary bypass graft: Secondary | ICD-10-CM | POA: Diagnosis not present

## 2022-05-11 DIAGNOSIS — Z953 Presence of xenogenic heart valve: Secondary | ICD-10-CM

## 2022-05-11 DIAGNOSIS — I480 Paroxysmal atrial fibrillation: Secondary | ICD-10-CM | POA: Diagnosis not present

## 2022-05-11 DIAGNOSIS — I724 Aneurysm of artery of lower extremity: Secondary | ICD-10-CM

## 2022-05-11 DIAGNOSIS — I7121 Aneurysm of the ascending aorta, without rupture: Secondary | ICD-10-CM | POA: Diagnosis not present

## 2022-05-11 DIAGNOSIS — Q231 Congenital insufficiency of aortic valve: Secondary | ICD-10-CM

## 2022-05-11 NOTE — Patient Instructions (Addendum)
Read about repatha and or praluent Also read about zetia (drops cholesterol 20%)  Goal total chol <140, Goal LDL <60  Medication Instructions:  No changes  If you need a refill on your cardiac medications before your next appointment, please call your pharmacy.   Lab work: No new labs needed  Testing/Procedures: Echo 04/2023 for AVR  Follow-Up: At [facility], you and your health needs are our priority.  As part of our continuing mission to provide you with exceptional heart care, we have created designated Provider Care Teams.  These Care Teams include your primary Cardiologist (physician) and Advanced Practice Providers (APPs -  Physician Assistants and Nurse Practitioners) who all work together to provide you with the care you need, when you need it.  You will need a follow up appointment in 12 months after echo  Providers on your designated Care Team:   Murray Hodgkins, NP Christell Faith, PA-C Cadence Kathlen Mody, [place]  COVID-19 Vaccine Information can be found at: [email or web address] For questions related to vaccine distribution or appointments, please email [email or web address] or call [phone].

## 2022-05-11 NOTE — Progress Notes (Signed)
Cardiology Office Note  Date:  05/11/2022   ID:  Jeffrey Tucker, DOB Sep 09, 1956, MRN [id number]  PCP:  Venia Carbon, MD   Chief Complaint  Patient presents with   12 month follow up    "Doing well." Medications reviewed by the patient verbally.     HPI:  [name] is a very pleasant 66 year old gentleman with  moderate aortic valve regurgitation, Echo 2020:  dilatation of the aortic root 4.9 cm and of the ascending aorta 4.4 cm, aortic arch 3.7 cm. bicuspid aortic valve.   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  Exercise walks treadmill 3-4 times a week  Off lipitor, doing natural OTC Has not had repeat lipid panel, prefers to have this done with primary care next month Numbers discussed with him in detail, numbers 1 year ago were close to goal on Lipitor Reports that he read about potential statin side effects, got scared He is not having any particular side effects to talk around  Denies any chest pain concerning for angina, no shortness of breath on exertion  EKG personally reviewed by myself on todays visit Nsr rate 57 no ST or T wave changes   Numbers below on Lipitor Lab Results  Component Value Date   CHOL 132 05/22/2021   HDL 29.30 (L) 05/22/2021   [place] 38 02/16/2021   TRIG 261.0 (H) 05/22/2021   Prior cardiac history reviewed   biological Bentall aortic root replacement and coronary artery bypass grafting x2 on August 27, 2019 by Dr. Orvan Seen.   discharged from the hospital on the seventh postoperative day.  readmitted to the hospital September 16, 2019 with severe chest pain and tachycardia.  CT scan and transthoracic echocardiogram revealed slight increase in the amount  of pericardial effusion and/or blood in the mediastinum.  brief episode of atrial fibrillation , treated with amiodarone. September 23, 2019 had a brief episode of hypotension which resolved without significant intervention.   developed bloody drainage from his sternal wound.   wife found him unresponsive.  She began CPR.   EMS was called and brought him directly to the emergency room where he was unresponsive on arrival  intubated and placed on mechanical ventilation. He was seen urgently by Dr Roxy Manns and was taken urgently to the Operating Room for exploration and repair.ligation of grafts  CT scan to have a right femoral pseudoaneurysm and vascular surgical consultation was obtained.    pseudoaneurysm measures 2 x 1 cm with a short neck.  surgical repair    PMH:   has a past medical history of Allergy, Aortic root dilatation ([name]), Asthma, Bicuspid aortic valve, BPH (benign prostatic hypertrophy), CAD (coronary artery disease), GERD (gastroesophageal reflux disease), History of kidney stones, Hypertension, Hypertriglyceridemia, Hypothyroidism, Pericardial tamponade (09/25/2019), Severe aortic regurgitation, and Wears glasses.  PSH:    Past Surgical History:  Procedure Laterality Date   BENTALL PROCEDURE N/A 08/27/2019   Procedure: BENTALL PROCEDURE with a 27 mm KONECT Resilia Aortic Valved Conduit and 30 mm x 30 cm Hemashield Platinum straight graft.;  Surgeon: Wonda Olds, MD;  [facility];  Service: Open Heart Surgery;  Laterality: N/A;   CARDIAC CATHETERIZATION  2006   [place]   CARDIOVASCULAR STRESS TEST  2006  [place]   CHOLECYSTECTOMY  2006   CORONARY ARTERY BYPASS GRAFT N/A 08/27/2019   Procedure: CORONARY ARTERY BYPASS GRAFTING (CABG) times 2 using right saphenous endoscopic vein harvesting to OM2 nad PDA.;  Surgeon: Wonda Olds, MD;  Location: [place];  Service: Open Heart Surgery;  Laterality: N/A;   CYSTOSCOPY W/ URETERAL STENT PLACEMENT  05/24/2017   Procedure:  left;  Surgeon: Nickie Retort, MD;  Location: [facility];  Service: Urology;;   CYSTOSCOPY W/ URETERAL STENT PLACEMENT Left 06/11/2017   Procedure: CYSTOSCOPY WITH STENT REPLACEMENT;  Surgeon: Hollice Espy, MD;  Location: [facility];  Service: Urology;  Laterality: Left;   CYSTOSCOPY WITH BIOPSY Left 06/11/2017   Procedure: RENAL PELVIS TUMOR WITH BIOPSY WITH POSSIBLE LASER ABLATION;  Surgeon: Hollice Espy, MD;  Location: [facility];  Service: Urology;  Laterality: Left;   CYSTOSCOPY WITH STENT PLACEMENT Left 05/24/2017   Procedure: , cystoscopy,left ureteral stent placement and left ureteroscopy attempted, retrograde pylogram;  Surgeon: Nickie Retort, MD;  Location: [facility];  Service: Urology;  Laterality: Left;   EYE SURGERY Bilateral    Cataract Extraction with IOL   FALSE ANEURYSM REPAIR  10/29/2019   REPAIR FALSE ANEURYSM RIGHT FEMORAL    FALSE ANEURYSM REPAIR Right 10/29/2019   Procedure: REPAIR FALSE ANEURYSM RIGHT FEMORAL;  Surgeon: Marty Heck, MD;  Location: [place];  Service: Vascular;  Laterality: Right;   HEMATOMA EVACUATION N/A 09/25/2019   Procedure: Evacuation Hematoma - Mediastinal Ligation of saphenous vein graft x two with Cardiopulmonary Bypass;  Surgeon: Rexene Alberts, MD;  Location: [place];  Service: Thoracic;  Laterality: N/A;   IR THORACENTESIS ASP PLEURAL SPACE W/IMG GUIDE  09/02/2019   RIGHT/LEFT HEART CATH AND CORONARY ANGIOGRAPHY N/A 08/21/2019   Procedure: RIGHT/LEFT HEART CATH AND CORONARY ANGIOGRAPHY;  Surgeon: Minna Merritts, MD;  Location: [place] [facility];  Service: Cardiovascular;  Laterality: N/A;   STERNOTOMY N/A 09/25/2019   Procedure: REDO STERNOTOMY;  Surgeon: Rexene Alberts, MD;  Location: [place];  Service: Thoracic;  Laterality: N/A;   TEE WITHOUT CARDIOVERSION N/A 08/27/2019   Procedure: TRANSESOPHAGEAL ECHOCARDIOGRAM (TEE);  Surgeon: Wonda Olds, MD;  Location: [place];  Service: Open Heart Surgery;  Laterality: N/A;   TEE  WITHOUT CARDIOVERSION N/A 09/25/2019   Procedure: Transesophageal Echocardiogram (Tee);  Surgeon: Rexene Alberts, MD;  Location: [place];  Service: Thoracic;  Laterality: N/A;   URETEROSCOPY WITH HOLMIUM LASER LITHOTRIPSY Left 06/11/2017   Procedure: URETEROSCOPY WITH HOLMIUM LASER LITHOTRIPSY;  Surgeon: Hollice Espy, MD;  Location: [facility];  Service: Urology;  Laterality: Left;   VEIN LIGATION AND STRIPPING Bilateral 1997    Current Outpatient Medications  Medication Sig Dispense Refill   albuterol (VENTOLIN HFA) 108 (90 Base) MCG/ACT inhaler Inhale 2 puffs into the lungs 3 (three) times daily as needed for wheezing or shortness of breath. 18 g 0   aspirin EC 81 MG EC tablet Take 1 tablet (81 mg total) by mouth daily. 30 tablet 0   atorvastatin (LIPITOR) 40 MG tablet TAKE ONE TABLET BY MOUTH DAILY 90 tablet 3   betamethasone, augmented, (DIPROLENE) 0.05 % lotion Apply topically 2 (two) times daily as needed. 60 mL 2   fluticasone-salmeterol (ADVAIR) 100-50 MCG/ACT AEPB INHALE ONE PUFF BY MOUTH TWICE A DAY 60 each 11   ketoconazole (NIZORAL) 2 % shampoo Apply 1 application topically once a week. 120 mL 5   levocetirizine (XYZAL) 5 MG tablet Take 5 mg by mouth every evening.  levothyroxine (SYNTHROID) 125 MCG tablet Take 1 tablet (125 mcg total) by mouth daily. 90 tablet 3   mometasone (ELOCON) 0.1 % lotion Apply topically 2 (two) times a week. 60 mL 6   No current facility-administered medications for this visit.     Allergies:   Tetracycline hcl, Beta adrenergic blockers, Metoprolol tartrate, and Amlodipine   Social History:  The patient  reports that he quit smoking about 43 years ago. His smoking use included cigarettes. He has never used smokeless tobacco. He reports that he does not drink alcohol and does not use drugs.   Family History:   family history includes Alcohol abuse in his father and mother; Asthma in his mother; Liver cancer in his paternal grandmother; Prostate  cancer in his father.    Review of Systems: Review of Systems  Constitutional: Negative.   HENT: Negative.    Respiratory: Negative.    Cardiovascular: Negative.   Gastrointestinal: Negative.   Musculoskeletal: Negative.   Neurological: Negative.   Psychiatric/Behavioral: Negative.    All other systems reviewed and are negative.   PHYSICAL EXAM: VS:  BP 128/80 (BP Location: Left Arm, Patient Position: Sitting, Cuff Size: Normal)   Pulse (!) 57   Ht '5\' 6"'$  (1.676 m)   Wt 165 lb 2 oz (74.9 kg)   SpO2 98%   BMI 26.65 kg/m  , BMI Body mass index is 26.65 kg/m. Constitutional:  oriented to person, place, and time. No distress.  HENT:  Head: Grossly normal Eyes:  no discharge. No scleral icterus.  Neck: No JVD, no carotid bruits  Cardiovascular: Regular rate and rhythm, no murmurs appreciated Pulmonary/Chest: Clear to auscultation bilaterally, no wheezes or rails Abdominal: Soft.  no distension.  no tenderness.  Musculoskeletal: Normal range of motion Neurological:  normal muscle tone. Coordination normal. No atrophy Skin: Skin warm and dry Psychiatric: normal affect, pleasant  Recent Labs: 05/22/2021: ALT 22; BUN 20; Creatinine, Ser 0.99; Hemoglobin 13.5; Platelets 235.0; Potassium 4.1; Sodium 141; TSH 0.07    Lipid Panel Lab Results  Component Value Date   CHOL 132 05/22/2021   HDL 29.30 (L) 05/22/2021   LDLCALC 38 02/16/2021   TRIG 261.0 (H) 05/22/2021    Wt Readings from Last 3 Encounters:  05/11/22 165 lb 2 oz (74.9 kg)  11/14/21 165 lb (74.8 kg)  05/22/21 159 lb (72.1 kg)     ASSESSMENT AND PLAN:  Bicuspid aortic valve -  Aortic valve insufficiency, Bioprosthetic valve  Recommend repeat echocardiogram June 2024  Cardiomyopathy In setting of cardiac arrest post surgery, low ejection fraction at that time Ejection fraction normalized on study March 2022  Ascending aorta dilatation ([place]) - Plan: EKG 12- Bentall aortic root replacement Echo ordered for  2024  CAD with stable angina Denies anginal symptoms Severe native coronary artery disease Not a good candidate for repeat CABG Stressed importance of aggressive cholesterol control  Hyperlipidemia He prefers to have numbers rechecked next month with Dr. Worthy Keeler Cholesterol and LDL goal numbers discussed with him Suspect they will run high without his Lipitor He has stopped the medication himself  Paroxysmal atrial fibrillation  Maintaining normal sinus rhythm   Total encounter time more than 30 minutes  Greater than 50% was spent in counseling and coordination of care with the patient   Orders Placed This Encounter  Procedures   EKG 12-Lead   ECHOCARDIOGRAM COMPLETE     Signed, Esmond Plants, M.D., Ph.D. 05/11/2022  [place], [place]

## 2022-05-22 ENCOUNTER — Encounter: Payer: Self-pay | Admitting: Internal Medicine

## 2022-05-22 DIAGNOSIS — I2510 Atherosclerotic heart disease of native coronary artery without angina pectoris: Secondary | ICD-10-CM

## 2022-05-22 DIAGNOSIS — Z125 Encounter for screening for malignant neoplasm of prostate: Secondary | ICD-10-CM

## 2022-05-22 DIAGNOSIS — E034 Atrophy of thyroid (acquired): Secondary | ICD-10-CM

## 2022-05-28 ENCOUNTER — Encounter: Payer: Self-pay | Admitting: Internal Medicine

## 2022-05-28 ENCOUNTER — Ambulatory Visit (INDEPENDENT_AMBULATORY_CARE_PROVIDER_SITE_OTHER): Payer: Medicare Other | Admitting: Internal Medicine

## 2022-05-28 VITALS — BP 128/88 | HR 52 | Temp 97.1°F | Ht 66.0 in | Wt 167.0 lb

## 2022-05-28 DIAGNOSIS — Z0000 Encounter for general adult medical examination without abnormal findings: Secondary | ICD-10-CM | POA: Diagnosis not present

## 2022-05-28 DIAGNOSIS — I2510 Atherosclerotic heart disease of native coronary artery without angina pectoris: Secondary | ICD-10-CM | POA: Diagnosis not present

## 2022-05-28 DIAGNOSIS — I10 Essential (primary) hypertension: Secondary | ICD-10-CM

## 2022-05-28 DIAGNOSIS — Z125 Encounter for screening for malignant neoplasm of prostate: Secondary | ICD-10-CM

## 2022-05-28 DIAGNOSIS — I7121 Aneurysm of the ascending aorta, without rupture: Secondary | ICD-10-CM

## 2022-05-28 DIAGNOSIS — N401 Enlarged prostate with lower urinary tract symptoms: Secondary | ICD-10-CM

## 2022-05-28 DIAGNOSIS — E034 Atrophy of thyroid (acquired): Secondary | ICD-10-CM

## 2022-05-28 DIAGNOSIS — J3089 Other allergic rhinitis: Secondary | ICD-10-CM

## 2022-05-28 DIAGNOSIS — E039 Hypothyroidism, unspecified: Secondary | ICD-10-CM

## 2022-05-28 DIAGNOSIS — J4520 Mild intermittent asthma, uncomplicated: Secondary | ICD-10-CM

## 2022-05-28 NOTE — Addendum Note (Signed)
Addended by: Pilar Grammes on: 05/28/2022 03:32 PM   Modules accepted: Orders

## 2022-05-28 NOTE — Addendum Note (Signed)
Addended by: Pilar Grammes on: 05/28/2022 03:33 PM   Modules accepted: Orders

## 2022-05-28 NOTE — Assessment & Plan Note (Signed)
Discussed that his throat clearing is likely related to this Asked him to restart xyzal or other OTC antihistamine

## 2022-05-28 NOTE — Progress Notes (Signed)
Hearing Screening - Comments:: Having trouble hearing when there is a lot of background noise Vision Screening - Comments:: May 2023

## 2022-05-28 NOTE — Assessment & Plan Note (Signed)
I have personally reviewed the Medicare Annual Wellness questionnaire and have noted 1. The patient's medical and social history 2. Their use of alcohol, tobacco or illicit drugs 3. Their current medications and supplements 4. The patient's functional ability including ADL's, fall risks, home safety risks and hearing or visual             impairment. 5. Diet and physical activities 6. Evidence for depression or mood disorders  The patients weight, height, BMI and visual acuity have been recorded in the chart I have made referrals, counseling and provided education to the patient based review of the above and I have provided the pt with a written personalized care plan for preventive services.  I have provided you with a copy of your personalized plan for preventive services. Please take the time to review along with your updated medication list.  Colonoscopy due in October Will defer PSA to next year Prefers no shingrix, COVID or flu vaccines Does exercise regularly

## 2022-05-28 NOTE — Assessment & Plan Note (Signed)
Does well with advair 100/10 daily Has albuterol inhaler but rarely or doesn't use

## 2022-05-28 NOTE — Assessment & Plan Note (Signed)
Had aortic valve repair Will repeat echo next year

## 2022-05-28 NOTE — Assessment & Plan Note (Signed)
BP Readings from Last 3 Encounters:  05/28/22 128/88  05/11/22 128/80  11/14/21 120/78   Good control without meds

## 2022-05-28 NOTE — Assessment & Plan Note (Signed)
Seems euthyroid Will check labs on levothyroxine 125 mcg

## 2022-05-28 NOTE — Addendum Note (Signed)
Addended by: Ellamae Sia on: 05/28/2022 09:53 AM   Modules accepted: Orders

## 2022-05-28 NOTE — Assessment & Plan Note (Signed)
No symptoms since CABG---despite one artery having to be tied off for bleeding Discussed that he should be back on the atorvastatin regardless of inflammatory markers or cholesterol levels On ASA '81mg'$ 

## 2022-05-28 NOTE — Progress Notes (Signed)
Subjective:    Patient ID: Jeffrey Tucker, male    DOB: [birth year], 66 y.o.   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 Not related to meals Not taking regular antihistamine---uses nasal spray prn No acid reflux since heart surgery No dysphagia  Current Outpatient Medications on File Prior to Visit  Medication Sig Dispense Refill   albuterol (VENTOLIN HFA) 108 (90 Base) MCG/ACT inhaler Inhale 2 puffs into the lungs 3 (three) times daily as needed for wheezing or shortness of breath. 18 g 0   aspirin EC 81 MG EC tablet Take 1 tablet (81 mg total) by mouth daily. 30 tablet 0   atorvastatin (LIPITOR) 40 MG tablet TAKE ONE TABLET BY MOUTH DAILY 90 tablet 3   fluticasone-salmeterol (ADVAIR) 100-50 MCG/ACT AEPB INHALE ONE PUFF BY MOUTH TWICE A DAY 60 each 11   ketoconazole (NIZORAL) 2 % shampoo Apply 1 application topically once a week. 120 mL 5   levocetirizine (XYZAL) 5 MG tablet Take 5 mg by mouth every evening.     levothyroxine (SYNTHROID) 125 MCG tablet Take 1 tablet (125 mcg total) by mouth daily. 90 tablet 3    mometasone (ELOCON) 0.1 % lotion Apply topically 2 (two) times a week. 60 mL 6   No current facility-administered medications on file prior to visit.    Allergies  Allergen Reactions   Tetracycline Hcl Other (See Comments)    Joint pain (and stiffened them, also)   Beta Adrenergic Blockers     Patient experienced abdomen vasospasms with Metoprolol succinate/tartrate and carvedilol.   Metoprolol Tartrate     Asthma and cough   Amlodipine Other (See Comments)    '10mg'$  dose causes lower extremity swelling. Pt can tolerate '5mg'$  dose.    Past Medical History:  Diagnosis Date   Allergy    allergic rhinitis   Aortic root dilatation ([facility])    a. s/p surgical repair 08/27/2019   Asthma    mostly in childhood   Bicuspid aortic valve    a.  Status post bioprosthetic replacement 08/27/2019   BPH (benign prostatic hypertrophy)    CAD (coronary artery disease)    a. s/p 2-v CABG 08/2019 (SVG-OM, SVG--rPDA) *Grafts subsequently ligated due to bleeding   GERD (gastroesophageal reflux disease)    History of kidney stones    Hypertension    Hypertriglyceridemia    Hypothyroidism    Pericardial tamponade 09/25/2019   Severe aortic regurgitation    a. bicuspid aortic valve, b. s/p bio Bentall procedure 08/2019   Wears glasses     Past Surgical History:  Procedure Laterality Date   BENTALL PROCEDURE N/A 08/27/2019   Procedure: BENTALL PROCEDURE with a 27 mm KONECT Resilia Aortic Valved  Conduit and 30 mm x 30 cm Hemashield Platinum straight graft.;  Surgeon: Wonda Olds, MD;  [facility];  Service: Open Heart Surgery;  Laterality: N/A;   CARDIAC CATHETERIZATION  2006   [place]   CARDIOVASCULAR STRESS TEST  2006   [place]   CHOLECYSTECTOMY  2006   CORONARY ARTERY BYPASS GRAFT N/A 08/27/2019   Procedure: CORONARY ARTERY BYPASS GRAFTING (CABG) times 2 using right saphenous endoscopic vein harvesting to OM2 nad PDA.;  Surgeon: Wonda Olds, MD;  Location: [place];  Service: Open Heart  Surgery;  Laterality: N/A;   CYSTOSCOPY W/ URETERAL STENT PLACEMENT  05/24/2017   Procedure: left;  Surgeon: Nickie Retort, MD;  Location: [facility];  Service: Urology;;   CYSTOSCOPY W/ URETERAL STENT PLACEMENT Left 06/11/2017   Procedure: CYSTOSCOPY WITH STENT REPLACEMENT;  Surgeon: Hollice Espy, MD;  Location: [facility];  Service: Urology;  Laterality: Left;   CYSTOSCOPY WITH BIOPSY Left 06/11/2017   Procedure: RENAL PELVIS TUMOR WITH BIOPSY WITH POSSIBLE LASER ABLATION;  Surgeon: Hollice Espy, MD;  Location: [facility];  Service: Urology;  Laterality: Left;   CYSTOSCOPY WITH STENT PLACEMENT Left 05/24/2017   Procedure: , cystoscopy,left ureteral stent placement and left ureteroscopy attempted, retrograde pylogram;  Surgeon: Nickie Retort, MD;  Location: [facility];  Service: Urology;  Laterality: Left;   EYE SURGERY Bilateral    Cataract Extraction with IOL   FALSE ANEURYSM REPAIR  10/29/2019   REPAIR FALSE ANEURYSM RIGHT FEMORAL    FALSE ANEURYSM REPAIR Right 10/29/2019   Procedure: REPAIR FALSE ANEURYSM RIGHT FEMORAL;  Surgeon: Marty Heck, MD;  Location: [place];  Service: Vascular;  Laterality: Right;   HEMATOMA EVACUATION N/A 09/25/2019   Procedure: Evacuation Hematoma - Mediastinal Ligation of saphenous vein graft x two with Cardiopulmonary Bypass;  Surgeon: Rexene Alberts, MD;  Location: [place];  Service: Thoracic;  Laterality: N/A;   IR THORACENTESIS ASP PLEURAL SPACE W/IMG GUIDE  09/02/2019   RIGHT/LEFT HEART CATH AND CORONARY ANGIOGRAPHY N/A 08/21/2019   Procedure: RIGHT/LEFT HEART CATH AND CORONARY ANGIOGRAPHY;  Surgeon: Minna Merritts, MD;  Location: [place] [facility];  Service: Cardiovascular;  Laterality: N/A;   STERNOTOMY N/A 09/25/2019   Procedure: REDO STERNOTOMY;  Surgeon: Rexene Alberts, MD;  Location: [place];  Service: Thoracic;  Laterality: N/A;   TEE WITHOUT CARDIOVERSION N/A 08/27/2019   Procedure: TRANSESOPHAGEAL ECHOCARDIOGRAM (TEE);  Surgeon:  Wonda Olds, MD;  Location: [place];  Service: Open Heart Surgery;  Laterality: N/A;   TEE WITHOUT CARDIOVERSION N/A 09/25/2019   Procedure: Transesophageal Echocardiogram (Tee);  Surgeon: Rexene Alberts, MD;  Location: [place];  Service: Thoracic;  Laterality: N/A;   URETEROSCOPY WITH HOLMIUM LASER LITHOTRIPSY Left 06/11/2017   Procedure: URETEROSCOPY WITH HOLMIUM LASER LITHOTRIPSY;  Surgeon: Hollice Espy, MD;  Location: [facility];  Service: Urology;  Laterality: Left;   VEIN LIGATION AND STRIPPING Bilateral 1997    Family History  Problem Relation Age of Onset   Alcohol abuse Mother    Asthma Mother    Alcohol abuse Father    Prostate cancer Father    Liver cancer Paternal Grandmother    Colon cancer Neg Hx    Coronary artery disease Neg Hx    Hypertension Neg Hx    Diabetes Neg Hx    Bladder Cancer Neg Hx    Kidney cancer Neg Hx     Social History   Socioeconomic History   Marital status: Married  Spouse name: Sherrell Farish   Number of children: 2   Years of education: Not on file   Highest education level: Not on file  Occupational History   Occupation:      Comment:     Occupation: Adult nurse estate    Comment: Retired   Occupation: Tax work---staff accountant    Comment: Full time now  Tobacco Use   Smoking status: Former    Types: Cigarettes    Quit date: 11/26/1978    Years since quitting: 43.5    Passive exposure: Past (as a child)   Smokeless tobacco: Never   Tobacco comments:    social smoked till 1980's  Vaping Use   Vaping Use: Never used  Substance and Sexual Activity   Alcohol use: No    Alcohol/week: 0.0 standard drinks of alcohol    Comment: none since 1984   Drug use: No   Sexual activity: Not on file  Other Topics Concern   Not on file  Social History Narrative   Has living will   Wife is health care POA---son would be alternate   Would accept resuscitation   Would accept feeding tube at first---but not if prolonged    Social  Determinants of Health   Financial Resource Strain: Not on file  Food Insecurity: Not on file  Transportation Needs: Not on file  Physical Activity: Not on file  Stress: Not on file  Social Connections: Not on file  Intimate Partner Violence: Not on file   Review of Systems Appetite is good Weight is up slightly Sleeps well Wears seat belt Teeth are okay---keeps up with dentist Voids okay---slow at times. Empties fine No sig back or joint pains No suspicious skin lesions now     Objective:   Physical Exam Constitutional:      Appearance: Normal appearance.  HENT:     Mouth/Throat:     Comments: No lesions Eyes:     Conjunctiva/sclera: Conjunctivae normal.     Pupils: Pupils are equal, round, and reactive to light.  Cardiovascular:     Rate and Rhythm: Normal rate and regular rhythm.     Pulses: Normal pulses.     Heart sounds:     No gallop.     Comments: Gr 3/6 aortic systolic murmur Prominent S2 Pulmonary:     Effort: Pulmonary effort is normal.     Breath sounds: Normal breath sounds. No wheezing or rales.  Abdominal:     Palpations: Abdomen is soft.     Tenderness: There is no abdominal tenderness.  Musculoskeletal:     Cervical back: Neck supple.     Right lower leg: No edema.     Left lower leg: No edema.  Lymphadenopathy:     Cervical: No cervical adenopathy.  Skin:    Findings: No lesion or rash.  Neurological:     General: No focal deficit present.     Mental Status: He is alert and oriented to person, place, and time.  Psychiatric:        Mood and Affect: Mood normal.        Behavior: Behavior normal.            Assessment & Plan:

## 2022-05-30 ENCOUNTER — Encounter: Payer: Self-pay | Admitting: Internal Medicine

## 2022-05-30 LAB — TSH: TSH: 0.9 m[IU]/L (ref 0.40–4.50)

## 2022-05-30 LAB — COMPREHENSIVE METABOLIC PANEL
AG Ratio: 1.6 (calc) (ref 1.0–2.5)
ALT: 14 U/L (ref 9–46)
AST: 17 U/L (ref 10–35)
Albumin: 4.5 g/dL (ref 3.6–5.1)
Alkaline phosphatase (APISO): 78 U/L (ref 35–144)
BUN: 15 mg/dL (ref 7–25)
CO2: 27 mmol/L (ref 20–32)
Calcium: 9.3 mg/dL (ref 8.6–10.3)
Chloride: 105 mmol/L (ref 98–110)
Creat: 0.75 mg/dL (ref 0.70–1.35)
Globulin: 2.9 g/dL (calc) (ref 1.9–3.7)
Glucose, Bld: 87 mg/dL (ref 65–99)
Potassium: 3.9 mmol/L (ref 3.5–5.3)
Sodium: 141 mmol/L (ref 135–146)
Total Bilirubin: 0.4 mg/dL (ref 0.2–1.2)
Total Protein: 7.4 g/dL (ref 6.1–8.1)

## 2022-05-30 LAB — C-REACTIVE PROTEIN: CRP: 2.2 mg/L (ref <8.0)

## 2022-05-30 LAB — LIPID PANEL
Cholesterol: 195 mg/dL (ref <200)
HDL: 32 mg/dL — ABNORMAL LOW (ref >=40)
Non-HDL Cholesterol (Calc): 163 mg/dL (calc) — ABNORMAL HIGH (ref <130)
Total CHOL/HDL Ratio: 6.1 (calc) — ABNORMAL HIGH (ref <5.0)
Triglycerides: 519 mg/dL — ABNORMAL HIGH (ref <150)

## 2022-05-30 LAB — CBC
HCT: 40.3 % (ref 38.5–50.0)
Hemoglobin: 14 g/dL (ref 13.2–17.1)
MCH: 32.6 pg (ref 27.0–33.0)
MCHC: 34.7 g/dL (ref 32.0–36.0)
MCV: 93.9 fL (ref 80.0–100.0)
MPV: 10.9 fL (ref 7.5–12.5)
Platelets: 247 10*3/uL (ref 140–400)
RBC: 4.29 10*6/uL (ref 4.20–5.80)
RDW: 13.1 % (ref 11.0–15.0)
WBC: 5.2 10*3/uL (ref 3.8–10.8)

## 2022-05-30 LAB — MAGNESIUM: Magnesium: 2.1 mg/dL (ref 1.5–2.5)

## 2022-05-30 LAB — HOMOCYSTEINE: Homocysteine: 10.3 umol/L (ref <11.4)

## 2022-05-30 LAB — T4, FREE: Free T4: 1.4 ng/dL (ref 0.8–1.8)

## 2022-05-30 LAB — PSA: PSA: 0.67 ng/mL (ref <=4.00)

## 2022-05-30 MED ORDER — ATORVASTATIN CALCIUM 40 MG PO TABS: 40.0000 mg | ORAL_TABLET | Freq: Every day | ORAL | 3 refills | Status: DC

## 2022-07-06 ENCOUNTER — Other Ambulatory Visit: Payer: Self-pay | Admitting: Internal Medicine

## 2022-07-09 ENCOUNTER — Encounter: Payer: Self-pay | Admitting: *Deleted

## 2022-07-09 DIAGNOSIS — Z006 Encounter for examination for normal comparison and control in clinical research program: Secondary | ICD-10-CM

## 2022-07-09 NOTE — Research (Signed)
Message left for Mr Jeffrey Tucker about [employer]. Encouraged him to call back

## 2022-08-03 ENCOUNTER — Encounter: Payer: Self-pay | Admitting: *Deleted

## 2022-08-03 DIAGNOSIS — Z006 Encounter for examination for normal comparison and control in clinical research program: Secondary | ICD-10-CM

## 2022-08-03 NOTE — Research (Signed)
Message left for Jeffrey Tucker about [employer] research. Encouraged him to call back.

## 2022-08-27 ENCOUNTER — Encounter: Payer: Self-pay | Admitting: Internal Medicine

## 2022-08-31 ENCOUNTER — Ambulatory Visit (INDEPENDENT_AMBULATORY_CARE_PROVIDER_SITE_OTHER): Payer: Medicare Other | Admitting: Internal Medicine

## 2022-08-31 ENCOUNTER — Encounter: Payer: Self-pay | Admitting: Internal Medicine

## 2022-08-31 VITALS — BP 138/88 | HR 68 | Temp 97.0°F | Ht 66.0 in | Wt 169.0 lb

## 2022-08-31 DIAGNOSIS — J4521 Mild intermittent asthma with (acute) exacerbation: Secondary | ICD-10-CM | POA: Diagnosis not present

## 2022-08-31 DIAGNOSIS — R5383 Other fatigue: Secondary | ICD-10-CM | POA: Diagnosis not present

## 2022-08-31 DIAGNOSIS — I2510 Atherosclerotic heart disease of native coronary artery without angina pectoris: Secondary | ICD-10-CM

## 2022-08-31 LAB — CBC
HCT: 42.6 % (ref 39.0–52.0)
Hemoglobin: 14.2 g/dL (ref 13.0–17.0)
MCHC: 33.5 g/dL (ref 30.0–36.0)
MCV: 94.6 fl (ref 78.0–100.0)
Platelets: 216 10*3/uL (ref 150.0–400.0)
RBC: 4.5 Mil/uL (ref 4.22–5.81)
RDW: 14.3 % (ref 11.5–15.5)
WBC: 7.2 10*3/uL (ref 4.0–10.5)

## 2022-08-31 LAB — COMPREHENSIVE METABOLIC PANEL
ALT: 17 U/L (ref 0–53)
AST: 16 U/L (ref 0–37)
Albumin: 4.3 g/dL (ref 3.5–5.2)
Alkaline Phosphatase: 106 U/L (ref 39–117)
BUN: 11 mg/dL (ref 6–23)
CO2: 27 mEq/L (ref 19–32)
Calcium: 9.1 mg/dL (ref 8.4–10.5)
Chloride: 106 mEq/L (ref 96–112)
Creatinine, Ser: 0.88 mg/dL (ref 0.40–1.50)
GFR: 89.49 mL/min (ref >60.00)
Glucose, Bld: 85 mg/dL (ref 70–99)
Potassium: 4 mEq/L (ref 3.5–5.1)
Sodium: 140 mEq/L (ref 135–145)
Total Bilirubin: 0.7 mg/dL (ref 0.2–1.2)
Total Protein: 7.1 g/dL (ref 6.0–8.3)

## 2022-08-31 LAB — TSH: TSH: 2.29 u[IU]/mL (ref 0.35–5.50)

## 2022-08-31 MED ORDER — PREDNISONE 20 MG PO TABS: 40.0000 mg | ORAL_TABLET | Freq: Every day | ORAL | 0 refills | Status: DC

## 2022-08-31 MED ORDER — KETOCONAZOLE 2 % EX SHAM: 1.0000 | MEDICATED_SHAMPOO | CUTANEOUS | 5 refills | Status: DC

## 2022-08-31 MED ORDER — ALBUTEROL SULFATE HFA 108 (90 BASE) MCG/ACT IN AERS: 2.0000 | INHALATION_SPRAY | Freq: Three times a day (TID) | RESPIRATORY_TRACT | 1 refills | Status: DC | PRN

## 2022-08-31 MED ORDER — AMOXICILLIN 500 MG PO TABS: 1000.0000 mg | ORAL_TABLET | Freq: Two times a day (BID) | ORAL | 0 refills | Status: AC

## 2022-08-31 NOTE — Progress Notes (Signed)
Subjective:    Patient ID: Jeffrey Tucker, male    DOB: 11-18-1956, 66 y.o.   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) Working full time also Doesn't think that is the problem  Sleeps well-- 6.5-7 hours per night Wife notes snoring--but no apnea Some energy drinks--will help with concentrate (like for tests) Drinks coffee--2 cups per day with caffeine  Now sick for 2 days Sneezing, tickle in throat Home from work yesterday---stuffed up, trouble breathing through nose Last night--went down into chest Some SOB and wheezing--his typical asthma  No fever, chills or sweats Not much cough  Current Outpatient Medications on File Prior to Visit  Medication Sig Dispense Refill   albuterol (VENTOLIN HFA) 108 (90 Base) MCG/ACT inhaler Inhale 2 puffs into the lungs 3 (three) times daily as needed for wheezing or shortness of breath. 18 g 0   aspirin EC 81 MG EC tablet Take 1 tablet (81 mg total) by mouth daily. 30 tablet 0   atorvastatin (LIPITOR) 40 MG tablet Take 1 tablet (40 mg total) by mouth daily. 90 tablet 3   fluticasone-salmeterol (ADVAIR) 100-50 MCG/ACT AEPB INHALE ONE PUFF BY MOUTH TWICE A DAY 60 each 11   ketoconazole (NIZORAL) 2 % shampoo Apply 1 application topically once a week. 120 mL 5   levocetirizine (XYZAL) 5 MG tablet Take 5 mg by mouth every evening.     levothyroxine (SYNTHROID) 125 MCG tablet TAKE ONE TABLET BY MOUTH DAILY 90 tablet 3   mometasone (ELOCON) 0.1 % lotion Apply topically 2 (two) times a week. 60 mL 6   No current facility-administered medications on file prior to visit.    Allergies  Allergen Reactions   Tetracycline Hcl Other (See Comments)    Joint pain (and stiffened them, also)   Beta Adrenergic Blockers     Patient experienced abdomen vasospasms with  Metoprolol succinate/tartrate and carvedilol.   Metoprolol Tartrate     Asthma and cough   Amlodipine Other (See Comments)    '10mg'$  dose causes lower extremity swelling. Pt can tolerate '5mg'$  dose.    Past Medical History:  Diagnosis Date   Allergy    allergic rhinitis   Aortic root dilatation (HCC)    a. s/p surgical repair 08/27/2019   Asthma    mostly in childhood   Bicuspid aortic valve    a.  Status post bioprosthetic replacement 08/27/2019   BPH (benign prostatic hypertrophy)    CAD (coronary artery disease)    a. s/p 2-v CABG 08/2019 (SVG-OM, SVG--rPDA) *Grafts subsequently ligated due to bleeding   GERD (gastroesophageal reflux disease)    History of kidney stones    Hypertension    Hypertriglyceridemia    Hypothyroidism    Pericardial tamponade 09/25/2019   Severe aortic regurgitation    a. bicuspid aortic valve, b. s/p bio Bentall procedure 08/2019   Wears glasses     Past Surgical History:  Procedure Laterality Date   BENTALL PROCEDURE N/A 08/27/2019   Procedure: BENTALL PROCEDURE with a 27 mm KONECT Resilia Aortic Valved Conduit and 30 mm x 30 cm Hemashield Platinum straight graft.;  Surgeon: Wonda Olds, MD;  Location: [place];  Service: Open Heart Surgery;  Laterality: N/A;   CARDIAC CATHETERIZATION  2006   [place]   CARDIOVASCULAR STRESS TEST  2006   [place]   CHOLECYSTECTOMY  2006   CORONARY ARTERY BYPASS GRAFT N/A 08/27/2019   Procedure: CORONARY ARTERY BYPASS GRAFTING (CABG) times 2 using right saphenous endoscopic vein harvesting to OM2 nad PDA.;  Surgeon: Wonda Olds, MD;  Location: [place];  Service: Open Heart Surgery;  Laterality: N/A;   CYSTOSCOPY W/ URETERAL STENT PLACEMENT  05/24/2017   Procedure: left;  Surgeon: Nickie Retort, MD;  Location: [facility];  Service: Urology;;   CYSTOSCOPY W/ URETERAL STENT PLACEMENT Left 06/11/2017   Procedure: CYSTOSCOPY WITH STENT REPLACEMENT;  Surgeon: Hollice Espy, MD;  Location: [facility];  Service:  Urology;  Laterality: Left;   CYSTOSCOPY WITH BIOPSY Left 06/11/2017   Procedure: RENAL PELVIS TUMOR WITH BIOPSY WITH POSSIBLE LASER ABLATION;  Surgeon: Hollice Espy, MD;  Location: [facility];  Service: Urology;  Laterality: Left;   CYSTOSCOPY WITH STENT PLACEMENT Left 05/24/2017   Procedure: , cystoscopy,left ureteral stent placement and left ureteroscopy attempted, retrograde pylogram;  Surgeon: Nickie Retort, MD;  Location: [facility];  Service: Urology;  Laterality: Left;   EYE SURGERY Bilateral    Cataract Extraction with IOL   FALSE ANEURYSM REPAIR  10/29/2019   REPAIR FALSE ANEURYSM RIGHT FEMORAL    FALSE ANEURYSM REPAIR Right 10/29/2019   Procedure: REPAIR FALSE ANEURYSM RIGHT FEMORAL;  Surgeon: Marty Heck, MD;  Location: [place];  Service: Vascular;  Laterality: Right;   HEMATOMA EVACUATION N/A 09/25/2019   Procedure: Evacuation Hematoma - Mediastinal Ligation of saphenous vein graft x two with Cardiopulmonary Bypass;  Surgeon: Rexene Alberts, MD;  Location: [place];  Service: Thoracic;  Laterality: N/A;   IR THORACENTESIS ASP PLEURAL SPACE W/IMG GUIDE  09/02/2019   RIGHT/LEFT HEART CATH AND CORONARY ANGIOGRAPHY N/A 08/21/2019   Procedure: RIGHT/LEFT HEART CATH AND CORONARY ANGIOGRAPHY;  Surgeon: Minna Merritts, MD;  Location: [place] [facility];  Service: Cardiovascular;  Laterality: N/A;   STERNOTOMY N/A 09/25/2019   Procedure: REDO STERNOTOMY;  Surgeon: Rexene Alberts, MD;  Location: [place];  Service: Thoracic;  Laterality: N/A;   TEE WITHOUT CARDIOVERSION N/A 08/27/2019   Procedure: TRANSESOPHAGEAL ECHOCARDIOGRAM (TEE);  Surgeon: Wonda Olds, MD;  Location: [place];  Service: Open Heart Surgery;  Laterality: N/A;   TEE WITHOUT CARDIOVERSION N/A 09/25/2019   Procedure: Transesophageal Echocardiogram (Tee);  Surgeon: Rexene Alberts, MD;  Location: [place];  Service: Thoracic;  Laterality: N/A;   URETEROSCOPY WITH HOLMIUM LASER LITHOTRIPSY Left 06/11/2017    Procedure: URETEROSCOPY WITH HOLMIUM LASER LITHOTRIPSY;  Surgeon: Hollice Espy, MD;  Location: [facility];  Service: Urology;  Laterality: Left;   VEIN LIGATION AND STRIPPING Bilateral 1997    Family History  Problem Relation Age of Onset   Alcohol abuse Mother    Asthma Mother    Alcohol abuse Father    Prostate cancer Father    Liver cancer Paternal Grandmother    Colon cancer Neg Hx    Coronary artery disease Neg Hx    Hypertension Neg Hx    Diabetes Neg Hx    Bladder Cancer Neg Hx    Kidney cancer Neg Hx     Social History   Socioeconomic History   Marital status: Married    Spouse name: Andree Golphin   Number of children: 2   Years of education: Not on file   Highest education level: Not on file  Occupational History   Occupation:      Comment:     Occupation: Chiropractor  real estate    Comment: Retired   Occupation: Tax work---staff accountant    Comment: Full time now  Tobacco Use   Smoking status: Former    Types: Cigarettes    Quit date: 11/26/1978    Years since quitting: 43.7    Passive exposure: Past (as a child)   Smokeless tobacco: Never   Tobacco comments:    social smoked till 1980's  Vaping Use   Vaping Use: Never used  Substance and Sexual Activity   Alcohol use: No    Alcohol/week: 0.0 standard drinks of alcohol    Comment: none since 1984   Drug use: No   Sexual activity: Not on file  Other Topics Concern   Not on file  Social History Narrative   Has living will   Wife is health care POA---son would be alternate   Would accept resuscitation   Would accept feeding tube at first---but not if prolonged    Social Determinants of Health   Financial Resource Strain: Not on file  Food Insecurity: Not on file  Transportation Needs: Not on file  Physical Activity: Not on file  Stress: Not on file  Social Connections: Not on file  Intimate Partner Violence: Not on file   Review of Systems No N/V Eating okay No depression or mood  problems     Objective:   Physical Exam Constitutional:      Appearance: Normal appearance.  HENT:     Mouth/Throat:     Pharynx: No oropharyngeal exudate or posterior oropharyngeal erythema.  Cardiovascular:     Rate and Rhythm: Normal rate and regular rhythm.     Heart sounds:     No gallop.     Comments: Soft systolic murmur Pulmonary:     Effort: Pulmonary effort is normal.     Breath sounds: Normal breath sounds. No wheezing or rales.  Musculoskeletal:     Cervical back: Neck supple.  Lymphadenopathy:     Cervical: No cervical adenopathy.     Upper Body:     Right upper body: No supraclavicular or axillary adenopathy.     Left upper body: No supraclavicular or axillary adenopathy.     Lower Body: No right inguinal adenopathy. No left inguinal adenopathy.  Neurological:     Mental Status: He is alert.  Psychiatric:        Mood and Affect: Mood normal.        Behavior: Behavior normal.            Assessment & Plan:

## 2022-08-31 NOTE — Assessment & Plan Note (Signed)
No clear sleep apnea---distant sleep study was negative Has some increase work with certification process--but he feels this isn't bad Sleeps enough--he thinks----but may need to increase to 8 hours Not depressed Nothing on exam----no nodes, HSM, etc Will recheck labs

## 2022-08-31 NOTE — Assessment & Plan Note (Signed)
Mild exacerbation Is on the advair 100/50 but will add prednisone 40 x 3 then 20 x 3 Albuterol prn Empiric amoxicillin

## 2022-10-14 ENCOUNTER — Encounter (INDEPENDENT_AMBULATORY_CARE_PROVIDER_SITE_OTHER): Payer: Self-pay

## 2022-10-19 ENCOUNTER — Encounter: Payer: Self-pay | Admitting: Internal Medicine

## 2022-10-30 ENCOUNTER — Encounter: Payer: Self-pay | Admitting: Internal Medicine

## 2022-10-31 MED ORDER — BUDESONIDE-FORMOTEROL FUMARATE 80-4.5 MCG/ACT IN AERO: 2.0000 | INHALATION_SPRAY | Freq: Two times a day (BID) | RESPIRATORY_TRACT | 11 refills | Status: DC

## 2023-03-13 ENCOUNTER — Ambulatory Visit (INDEPENDENT_AMBULATORY_CARE_PROVIDER_SITE_OTHER): Payer: Medicare Other | Admitting: Dermatology

## 2023-03-13 VITALS — BP 127/77 | HR 67

## 2023-03-13 DIAGNOSIS — L814 Other melanin hyperpigmentation: Secondary | ICD-10-CM

## 2023-03-13 DIAGNOSIS — L821 Other seborrheic keratosis: Secondary | ICD-10-CM

## 2023-03-13 DIAGNOSIS — D1801 Hemangioma of skin and subcutaneous tissue: Secondary | ICD-10-CM

## 2023-03-13 DIAGNOSIS — L219 Seborrheic dermatitis, unspecified: Secondary | ICD-10-CM | POA: Diagnosis not present

## 2023-03-13 MED ORDER — KETOCONAZOLE 2 % EX SHAM: MEDICATED_SHAMPOO | CUTANEOUS | 11 refills | Status: DC

## 2023-03-13 NOTE — Progress Notes (Signed)
   Follow-Up Visit   Subjective  Jeffrey Tucker is a 67 y.o. male who presents for the following: Spot on the right temple that patient's wife noticed a couple of weeks ago. He also has a spot on the right axilla he would like checked and the scalp. He uses ketoconazole 2% shampoo and mometasone lotion for Seb Derm of the scalp.   The patient has spots, moles and lesions to be evaluated, some may be new or changing and the patient has concerns that these could be cancer.    The following portions of the chart were reviewed this encounter and updated as appropriate: medications, allergies, medical history  Review of Systems:  No other skin or systemic complaints except as noted in HPI or Assessment and Plan.  Objective  Well appearing patient in no apparent distress; mood and affect are within normal limits.  A focused examination was performed of the following areas: Face, scalp, trunk  Relevant physical exam findings are noted in the Assessment and Plan.    Assessment & Plan   SEBORRHEIC KERATOSIS - Stuck-on, waxy, tan-brown papules of the right flank;  light tan waxy papule of the R post parietal scalp, 5.0 mm speckled tan papule of the frontal hairline (SK vs Nevus) - Benign-appearing - Discussed benign etiology and prognosis. - Observe - Call for any changes   LENTIGO VS SEBORRHEIC KERATOSIS Exam: 0.6CM light tan macule of the right temple Due to sun exposure Treatment Plan: Benign-appearing, observe. Recommend daily broad spectrum sunscreen SPF 30+ to sun-exposed areas, reapply every 2 hours as needed.  Call for any changes Discussed cosmetic procedure, noncovered.  $60 for 1st lesion and $15 for each additional lesion if done on the same day.  Maximum charge $350.  One touch-up treatment included no charge. Discussed risks of treatment including dyspigmentation, small scar, and/or recurrence. Recommend daily broad spectrum sunscreen SPF 30+/photoprotection to treated areas  once healed.  SEBORRHEIC DERMATITIS Exam: Scalp clear  Chronic condition with duration or expected duration over one year. Currently well-controlled.   Seborrheic Dermatitis is a chronic persistent rash characterized by pinkness and scaling most commonly of the mid face but also can occur on the scalp (dandruff), ears; mid chest, mid back and groin.  It tends to be exacerbated by stress and cooler weather.  People who have neurologic disease may experience new onset or exacerbation of existing seborrheic dermatitis.  The condition is not curable but treatable and can be controlled.  Treatment Plan: Continue Ketoconazole 2% shampoo - massage into scalp 2 times per week, let sit several minutes before rinsing 1 yr Rf.   HEMANGIOMA Exam: red papules in the scalp Discussed benign nature. Recommend observation. Call for changes.  Return if symptoms worsen or fail to improve.  ICherlyn Labella, CMA, am acting as scribe for Willeen Niece, MD .   Documentation: I have reviewed the above documentation for accuracy and completeness, and I agree with the above.  Willeen Niece, MD

## 2023-03-13 NOTE — Patient Instructions (Addendum)
Seborrheic Keratosis  What causes seborrheic keratoses? Seborrheic keratoses are harmless, common skin growths that first appear during adult life.  As time goes by, more growths appear.  Some people may develop a large number of them.  Seborrheic keratoses appear on both covered and uncovered body parts.  They are not caused by sunlight.  The tendency to develop seborrheic keratoses can be inherited.  They vary in color from skin-colored to gray, brown, or even black.  They can be either smooth or have a rough, warty surface.   Seborrheic keratoses are superficial and look as if they were stuck on the skin.  Under the microscope this type of keratosis looks like layers upon layers of skin.  That is why at times the top layer may seem to fall off, but the rest of the growth remains and re-grows.    Treatment Seborrheic keratoses do not need to be treated, but can easily be removed in the office.  Seborrheic keratoses often cause symptoms when they rub on clothing or jewelry.  Lesions can be in the way of shaving.  If they become inflamed, they can cause itching, soreness, or burning.  Removal of a seborrheic keratosis can be accomplished by freezing, burning, or surgery. If any spot bleeds, scabs, or grows rapidly, please return to have it checked, as these can be an indication of a skin cancer.  Due to recent changes in healthcare laws, you may see results of your pathology and/or laboratory studies on MyChart before the doctors have had a chance to review them. We understand that in some cases there may be results that are confusing or concerning to you. Please understand that not all results are received at the same time and often the doctors may need to interpret multiple results in order to provide you with the best plan of care or course of treatment. Therefore, we ask that you please give us 2 business days to thoroughly review all your results before contacting the office for clarification. Should  we see a critical lab result, you will be contacted sooner.   If You Need Anything After Your Visit  If you have any questions or concerns for your doctor, please call our main line at [phone] and press option 4 to reach your doctor's medical assistant. If no one answers, please leave a voicemail as directed and we will return your call as soon as possible. Messages left after 4 pm will be answered the following business day.   You may also send us a message via MyChart. We typically respond to MyChart messages within 1-2 business days.  For prescription refills, please ask your pharmacy to contact our office. Our fax number is [phone].  If you have an urgent issue when the clinic is closed that cannot wait until the next business day, you can page your doctor at the number below.    Please note that while we do our best to be available for urgent issues outside of office hours, we are not available 24/7.   If you have an urgent issue and are unable to reach us, you may choose to seek medical care at your doctor's office, retail clinic, urgent care center, or emergency room.  If you have a medical emergency, please immediately call 911 or go to the emergency department.  Pager Numbers  - Dr. Kowalski: [phone]  - Dr. Moye: [phone]  - Dr. Stewart: [phone]  In the event of inclement weather, please call our main line at   [phone] for an update on the status of any delays or closures.  Dermatology Medication Tips: Please keep the boxes that topical medications come in in order to help keep track of the instructions about where and how to use these. Pharmacies typically print the medication instructions only on the boxes and not directly on the medication tubes.   If your medication is too expensive, please contact our office at [phone] option 4 or send us a message through MyChart.   We are unable to tell what your co-pay for medications will be in  advance as this is different depending on your insurance coverage. However, we may be able to find a substitute medication at lower cost or fill out paperwork to get insurance to cover a needed medication.   If a prior authorization is required to get your medication covered by your insurance company, please allow us 1-2 business days to complete this process.  Drug prices often vary depending on where the prescription is filled and some pharmacies may offer cheaper prices.  The website www.goodrx.com contains coupons for medications through different pharmacies. The prices here do not account for what the cost may be with help from insurance (it may be cheaper with your insurance), but the website can give you the price if you did not use any insurance.  - You can print the associated coupon and take it with your prescription to the pharmacy.  - You may also stop by our office during regular business hours and pick up a GoodRx coupon card.  - If you need your prescription sent electronically to a different pharmacy, notify our office through [place] MyChart or by phone at [phone] option 4.     Si Usted Necesita Algo Despus de Su Visita  Tambin puede enviarnos un mensaje a travs de MyChart. Por lo general respondemos a los mensajes de MyChart en el transcurso de 1 a 2 das hbiles.  Para renovar recetas, por favor pida a su farmacia que se ponga en contacto con nuestra oficina. Nuestro nmero de fax es el [phone].  Si tiene un asunto urgente cuando la clnica est cerrada y que no puede esperar hasta el siguiente da hbil, puede llamar/localizar a su doctor(a) al nmero que aparece a continuacin.   Por favor, tenga en cuenta que aunque hacemos todo lo posible para estar disponibles para asuntos urgentes fuera del horario de oficina, no estamos disponibles las 24 horas del da, los 7 das de la semana.   Si tiene un problema urgente y no puede comunicarse con nosotros, puede  optar por buscar atencin mdica  en el consultorio de su doctor(a), en una clnica privada, en un centro de atencin urgente o en una sala de emergencias.  Si tiene una emergencia mdica, por favor llame inmediatamente al 911 o vaya a la sala de emergencias.  Nmeros de bper  - Dr. Kowalski: [phone]  - Dra. Moye: [phone]  - Dra. Stewart: [phone]  En caso de inclemencias del tiempo, por favor llame a nuestra lnea principal al [phone] para una actualizacin sobre el estado de cualquier retraso o cierre.  Consejos para la medicacin en dermatologa: Por favor, guarde las cajas en las que vienen los medicamentos de uso tpico para ayudarle a seguir las instrucciones sobre dnde y cmo usarlos. Las farmacias generalmente imprimen las instrucciones del medicamento slo en las cajas y no directamente en los tubos del medicamento.   Si su medicamento es muy caro, por favor, pngase en contacto con   nuestra oficina llamando al [phone] y presione la opcin 4 o envenos un mensaje a travs de MyChart.   No podemos decirle cul ser su copago por los medicamentos por adelantado ya que esto es diferente dependiendo de la cobertura de su seguro. Sin embargo, es posible que podamos encontrar un medicamento sustituto a menor costo o llenar un formulario para que el seguro cubra el medicamento que se considera necesario.   Si se requiere una autorizacin previa para que su compaa de seguros cubra su medicamento, por favor permtanos de 1 a 2 das hbiles para completar este proceso.  Los precios de los medicamentos varan con frecuencia dependiendo del lugar de dnde se surte la receta y alguna farmacias pueden ofrecer precios ms baratos.  El sitio web www.goodrx.com tiene cupones para medicamentos de diferentes farmacias. Los precios aqu no tienen en cuenta lo que podra costar con la ayuda del seguro (puede ser ms barato con su seguro), pero el sitio web puede darle el  precio si no utiliz ningn seguro.  - Puede imprimir el cupn correspondiente y llevarlo con su receta a la farmacia.  - Tambin puede pasar por nuestra oficina durante el horario de atencin regular y recoger una tarjeta de cupones de GoodRx.  - Si necesita que su receta se enve electrnicamente a una farmacia diferente, informe a nuestra oficina a travs de MyChart de [place] o por telfono llamando al [phone] y presione la opcin 4.  

## 2023-05-13 NOTE — Progress Notes (Unsigned)
Cardiology Office Note  Date:  05/14/2023   ID:  Jeffrey Tucker, DOB [birth year], MRN [id number]  PCP:  Jeffrey Schwalbe, MD   Chief Complaint  Patient presents with   12 month follow up     "Doing well." Medications reviewed by the patient verbally.     HPI:  Jeffrey Tucker is a very pleasant 67 year-old gentleman with  moderate aortic valve regurgitation, Echo 2020:  dilatation of the aortic root 4.9 cm and of the ascending aorta 4.4 cm, aortic arch 3.7 cm. bicuspid aortic valve.   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 LDL cholesterol not calculated. Triglyceride levels greater than 400 mg/dL invalidate calculated LDL results. . Reference range: <100 . Desirable range <100 mg/dL for primary prevention;   <70 mg/dL for patients with CHD or diabetic patients  with > or = 2 CHD risk factors. Marland Kitchen LDL-C is now calculated using the Martin-Hopkins  calculation, which is a validated novel method providing   better accuracy than the Friedewald equation in the  estimation of LDL-C.  [name] 4098;119(14): 2061-2068  (http://education.[facility].com/faq/FAQ164)    TRIG 519 (H) 05/28/2022   Prior cardiac history reviewed   biological Bentall aortic root replacement and coronary artery bypass grafting x2 on August 27, 2019 by Dr. Vickey Sages.   discharged from the hospital on the seventh postoperative day.  readmitted to the hospital September 16, 2019 with severe chest pain and tachycardia.  CT scan and transthoracic echocardiogram revealed slight increase in the amount of pericardial effusion and/or blood in the mediastinum.  brief episode of atrial fibrillation , treated with amiodarone. September 23, 2019 had a brief episode of hypotension which resolved without significant intervention.   developed bloody drainage from his sternal wound.   wife found him unresponsive.  She began CPR.   EMS was called and brought him directly to the emergency room where he was unresponsive on arrival  intubated and placed on mechanical ventilation. He was seen urgently by Dr Cornelius Moras and was taken urgently to the Operating Room for exploration and repair.ligation of grafts  CT scan to have a right femoral pseudoaneurysm and vascular surgical consultation was obtained.    pseudoaneurysm measures 2 x 1 cm with a short neck.  surgical repair    PMH:   has a past medical history of Allergy, Aortic root dilatation (HCC), Asthma, Bicuspid aortic valve, BPH (benign  prostatic hypertrophy), CAD (coronary artery disease), GERD (gastroesophageal reflux disease), History of kidney stones, Hypertension, Hypertriglyceridemia, Hypothyroidism, Pericardial tamponade (09/25/2019), Severe aortic regurgitation, and Wears glasses.  PSH:    Past Surgical History:  Procedure Laterality Date   BENTALL PROCEDURE N/A 08/27/2019   Procedure: BENTALL PROCEDURE with a 27 mm KONECT Resilia Aortic Valved Conduit and 30 mm x 30  cm Hemashield Platinum straight graft.;  Surgeon: Linden Dolin, MD;  Location: [facility] OR;  Service: Open Heart Surgery;  Laterality: N/A;   CARDIAC CATHETERIZATION  2006   [place]   CARDIOVASCULAR STRESS TEST  2006   [place]   CHOLECYSTECTOMY  2006   CORONARY ARTERY BYPASS GRAFT N/A 08/27/2019   Procedure: CORONARY ARTERY BYPASS GRAFTING (CABG) times 2 using right saphenous endoscopic vein harvesting to OM2 nad PDA.;  Surgeon: Linden Dolin, MD;  Location: [facility] OR;  Service: Open Heart Surgery;  Laterality: N/A;   CYSTOSCOPY W/ URETERAL STENT PLACEMENT  05/24/2017   Procedure: left;  Surgeon: Hildred Laser, MD;  Location: [facility] ORS;  Service: Urology;;   CYSTOSCOPY W/ URETERAL STENT PLACEMENT Left 06/11/2017   Procedure: CYSTOSCOPY WITH STENT REPLACEMENT;  Surgeon: Vanna Scotland, MD;  Location: [facility] ORS;  Service: Urology;  Laterality: Left;   CYSTOSCOPY WITH BIOPSY Left 06/11/2017   Procedure: RENAL PELVIS TUMOR WITH BIOPSY WITH POSSIBLE LASER ABLATION;  Surgeon: Vanna Scotland, MD;  Location: [facility] ORS;  Service: Urology;  Laterality: Left;   CYSTOSCOPY WITH STENT PLACEMENT Left 05/24/2017   Procedure: , cystoscopy,left ureteral stent placement and left ureteroscopy attempted, retrograde pylogram;  Surgeon: Hildred Laser, MD;  Location: [facility] ORS;  Service: Urology;  Laterality: Left;   EYE SURGERY Bilateral    Cataract Extraction with IOL   FALSE ANEURYSM REPAIR  10/29/2019   REPAIR FALSE ANEURYSM RIGHT FEMORAL    FALSE ANEURYSM REPAIR Right 10/29/2019   Procedure: REPAIR FALSE ANEURYSM RIGHT FEMORAL;  Surgeon: Cephus Shelling, MD;  Location: [facility] OR;  Service: Vascular;  Laterality: Right;   HEMATOMA EVACUATION N/A 09/25/2019   Procedure: Evacuation Hematoma - Mediastinal Ligation of saphenous vein graft x two with Cardiopulmonary Bypass;  Surgeon: Purcell Nails, MD;  Location: [facility] OR;  Service: Thoracic;  Laterality: N/A;   IR THORACENTESIS ASP PLEURAL SPACE W/IMG GUIDE   09/02/2019   RIGHT/LEFT HEART CATH AND CORONARY ANGIOGRAPHY N/A 08/21/2019   Procedure: RIGHT/LEFT HEART CATH AND CORONARY ANGIOGRAPHY;  Surgeon: Antonieta Iba, MD;  Location: [facility] INVASIVE CV LAB;  Service: Cardiovascular;  Laterality: N/A;   STERNOTOMY N/A 09/25/2019   Procedure: REDO STERNOTOMY;  Surgeon: Purcell Nails, MD;  Location: [facility] OR;  Service: Thoracic;  Laterality: N/A;   TEE WITHOUT CARDIOVERSION N/A 08/27/2019   Procedure: TRANSESOPHAGEAL ECHOCARDIOGRAM (TEE);  Surgeon: Linden Dolin, MD;  Location[facility];  Service: Open Heart Surgery;  Laterality: N/A;   TEE WITHOUT CARDIOVERSION N/A 09/25/2019   Procedure: Transesophageal Echocardiogram (Tee);  Surgeon: Purcell Nails, MD;  Location: [facility] OR;  Service: Thoracic;  Laterality: N/A;   URETEROSCOPY WITH HOLMIUM LASER LITHOTRIPSY Left 06/11/2017   Procedure: URETEROSCOPY WITH HOLMIUM LASER LITHOTRIPSY;  Surgeon: Vanna Scotland, MD;  Location: [facility] ORS;  Service: Urology;  Laterality: Left;   VEIN LIGATION AND STRIPPING Bilateral 1997    Current Outpatient Medications  Medication Sig Dispense Refill   albuterol (VENTOLIN HFA) 108 (90 Base) MCG/ACT inhaler Inhale 2 puffs into the lungs 3 (three) times daily as needed for wheezing or shortness of breath. 18 g 1  aspirin EC 81 MG EC tablet Take 1 tablet (81 mg total) by mouth daily. 30 tablet 0   atorvastatin (LIPITOR) 40 MG tablet Take 1 tablet (40 mg total) by mouth daily. 90 tablet 3   budesonide-formoterol (SYMBICORT) 80-4.5 MCG/ACT inhaler Inhale 2 puffs into the lungs 2 (two) times daily. 1 each 11   ketoconazole (NIZORAL) 2 % shampoo Massage into scalp 1-2 times per week, let sit several minutes before rinsing. 120 mL 11   levocetirizine (XYZAL) 5 MG tablet Take 5 mg by mouth every evening.     levothyroxine (SYNTHROID) 125 MCG tablet TAKE ONE TABLET BY MOUTH DAILY 90 tablet 3   mometasone (ELOCON) 0.1 % lotion Apply topically 2 (two) times a week. 60 mL 6   No current  facility-administered medications for this visit.     Allergies:   Tetracycline hcl, Beta adrenergic blockers, Metoprolol tartrate, and Amlodipine   Social History:  The patient  reports that he quit smoking about 44 years ago. His smoking use included cigarettes. He has been exposed to tobacco smoke. He has never used smokeless tobacco. He reports that he does not drink alcohol and does not use drugs.   Family History:   family history includes Alcohol abuse in his father and mother; Asthma in his mother; Liver cancer in his paternal grandmother; Prostate cancer in his father.    Review of Systems: Review of Systems  Constitutional: Negative.   HENT: Negative.    Respiratory: Negative.    Cardiovascular: Negative.   Gastrointestinal: Negative.   Musculoskeletal: Negative.   Neurological: Negative.   Psychiatric/Behavioral: Negative.    All other systems reviewed and are negative.   PHYSICAL EXAM: VS:  BP 132/72 (BP Location: Left Arm, Patient Position: Sitting, Cuff Size: Normal)   Pulse (!) 53   Ht 5\' 6"  (1.676 m)   Wt 166 lb 8 oz (75.5 kg)   SpO2 98%   BMI 26.87 kg/m  , BMI Body mass index is 26.87 kg/m. Constitutional:  oriented to person, place, and time. No distress.  HENT:  Head: Grossly normal Eyes:  no discharge. No scleral icterus.  Neck: No JVD, no carotid bruits  Cardiovascular: Regular rate and rhythm, no murmurs appreciated Pulmonary/Chest: Clear to auscultation bilaterally, no wheezes or rails Abdominal: Soft.  no distension.  no tenderness.  Musculoskeletal: Normal range of motion Neurological:  normal muscle tone. Coordination normal. No atrophy Skin: Skin warm and dry Psychiatric: normal affect, pleasant   Recent Labs: 05/28/2022: Magnesium 2.1 08/31/2022: ALT 17; BUN 11; Creatinine, Ser 0.88; Hemoglobin 14.2; Platelets 216.0; Potassium 4.0; Sodium 140; TSH 2.29    Lipid Panel Lab Results  Component Value Date   CHOL 195 05/28/2022   HDL 32 (L)  05/28/2022   LDLCALC  05/28/2022     Comment:     . LDL cholesterol not calculated. Triglyceride levels greater than 400 mg/dL invalidate calculated LDL results. . Reference range: <100 . Desirable range <100 mg/dL for primary prevention;   <70 mg/dL for patients with CHD or diabetic patients  with > or = 2 CHD risk factors. Marland Kitchen LDL-C is now calculated using the Martin-Hopkins  calculation, which is a validated novel method providing  better accuracy than the Friedewald equation in the  estimation of LDL-C.  [name] 6578;469(62): 2061-2068  (http://education.[facility].com/faq/FAQ164)    TRIG 519 (H) 05/28/2022    Wt Readings from Last 3 Encounters:  05/14/23 166 lb 8 oz (75.5 kg)  08/31/22 169 lb (  76.7 kg)  05/28/22 167 lb (75.8 kg)     ASSESSMENT AND PLAN:  Bicuspid aortic valve -  Aortic valve insufficiency, Bioprosthetic valve placed surgically Recommend repeat echocardiogram June 2024  Cardiomyopathy In setting of cardiac arrest post surgery, low ejection fraction at that time Ejection fraction normalized on study March 2022 Repeat echocardiogram ordered for AVR  Ascending aorta dilatation (HCC) - Plan: EKG 12- Bentall aortic root replacement Echo ordered for June 2024  CAD with stable angina Currently with no symptoms of angina. No further workup at this time. Continue current medication regimen. Severe native coronary artery disease Not a good candidate for repeat CABG Stressed importance of aggressive cholesterol control  Hyperlipidemia He will call primary care to have lab work done before routine visit in early July  Paroxysmal atrial fibrillation  Maintaining normal sinus rhythm   Total encounter time more than 30 minutes  Greater than 50% was spent in counseling and coordination of care with the patient   No orders of the defined types were placed in this encounter.    Signed, Dossie Arbour, M.D., Ph.D. 05/14/2023  [facility] [place], [place] [phone]

## 2023-05-14 ENCOUNTER — Encounter: Payer: Self-pay | Admitting: Internal Medicine

## 2023-05-14 ENCOUNTER — Encounter: Payer: Self-pay | Admitting: Cardiovascular Disease

## 2023-05-14 ENCOUNTER — Ambulatory Visit: Payer: Medicare Other | Attending: Cardiovascular Disease | Admitting: Cardiovascular Disease

## 2023-05-14 VITALS — BP 132/72 | HR 53 | Ht 66.0 in | Wt 166.5 lb

## 2023-05-14 DIAGNOSIS — I351 Nonrheumatic aortic (valve) insufficiency: Secondary | ICD-10-CM | POA: Insufficient documentation

## 2023-05-14 DIAGNOSIS — E039 Hypothyroidism, unspecified: Secondary | ICD-10-CM

## 2023-05-14 DIAGNOSIS — Q231 Congenital insufficiency of aortic valve: Secondary | ICD-10-CM | POA: Insufficient documentation

## 2023-05-14 DIAGNOSIS — Z125 Encounter for screening for malignant neoplasm of prostate: Secondary | ICD-10-CM

## 2023-05-14 DIAGNOSIS — I2510 Atherosclerotic heart disease of native coronary artery without angina pectoris: Secondary | ICD-10-CM

## 2023-05-14 DIAGNOSIS — I480 Paroxysmal atrial fibrillation: Secondary | ICD-10-CM | POA: Diagnosis not present

## 2023-05-14 DIAGNOSIS — I25118 Atherosclerotic heart disease of native coronary artery with other forms of angina pectoris: Secondary | ICD-10-CM | POA: Diagnosis not present

## 2023-05-14 DIAGNOSIS — I724 Aneurysm of artery of lower extremity: Secondary | ICD-10-CM | POA: Diagnosis present

## 2023-05-14 DIAGNOSIS — Z953 Presence of xenogenic heart valve: Secondary | ICD-10-CM | POA: Insufficient documentation

## 2023-05-14 DIAGNOSIS — Z951 Presence of aortocoronary bypass graft: Secondary | ICD-10-CM | POA: Diagnosis present

## 2023-05-14 DIAGNOSIS — I7121 Aneurysm of the ascending aorta, without rupture: Secondary | ICD-10-CM | POA: Insufficient documentation

## 2023-05-14 MED ORDER — ATORVASTATIN CALCIUM 40 MG PO TABS: 40.0000 mg | ORAL_TABLET | Freq: Every day | ORAL | 3 refills | Status: DC

## 2023-05-14 NOTE — Patient Instructions (Addendum)
   Medication Instructions:  No changes  If you need a refill on your cardiac medications before your next appointment, please call your pharmacy.   Lab work: No new labs needed  Testing/Procedures:   Your physician has requested that you have an echocardiogram. Echocardiography is a painless test that uses sound waves to create images of your heart. It provides your doctor with information about the size and shape of your heart and how well your heart's chambers and valves are working.   You may receive an ultrasound enhancing agent through an IV if needed to better visualize your heart during the echo. This procedure takes approximately one hour.  There are no restrictions for this procedure.  This will take place at [address] ([facility] [address], [place] [address]  Follow-Up: At [facility], you and your health needs are our priority.  As part of our continuing mission to provide you with exceptional heart care, we have created designated Provider Care Teams.  These Care Teams include your primary Cardiologist (physician) and Advanced Practice Providers (APPs -  Physician Assistants and Nurse Practitioners) who all work together to provide you with the care you need, when you need it.  You will need a follow up appointment in 12 months  Providers on your designated Care Team:   Christopher Berge, NP Ryan Dunn, PA-C Cadence Furth, PA-C  COVID-19 Vaccine Information can be found at: [email or web address] For questions related to vaccine distribution or appointments, please email [email or web address] or call [phone].   

## 2023-05-31 ENCOUNTER — Encounter: Payer: Medicare Other | Admitting: Internal Medicine

## 2023-06-07 ENCOUNTER — Encounter: Payer: Self-pay | Admitting: *Deleted

## 2023-06-07 ENCOUNTER — Encounter: Payer: Self-pay | Admitting: Internal Medicine

## 2023-06-07 ENCOUNTER — Ambulatory Visit (INDEPENDENT_AMBULATORY_CARE_PROVIDER_SITE_OTHER): Payer: Medicare Other | Admitting: Internal Medicine

## 2023-06-07 VITALS — BP 120/70 | HR 52 | Temp 97.7°F | Ht 66.0 in | Wt 162.0 lb

## 2023-06-07 DIAGNOSIS — I7121 Aneurysm of the ascending aorta, without rupture: Secondary | ICD-10-CM

## 2023-06-07 DIAGNOSIS — I2510 Atherosclerotic heart disease of native coronary artery without angina pectoris: Secondary | ICD-10-CM | POA: Diagnosis not present

## 2023-06-07 DIAGNOSIS — Z125 Encounter for screening for malignant neoplasm of prostate: Secondary | ICD-10-CM

## 2023-06-07 DIAGNOSIS — Z0000 Encounter for general adult medical examination without abnormal findings: Secondary | ICD-10-CM | POA: Diagnosis not present

## 2023-06-07 DIAGNOSIS — J4520 Mild intermittent asthma, uncomplicated: Secondary | ICD-10-CM | POA: Diagnosis not present

## 2023-06-07 DIAGNOSIS — J3089 Other allergic rhinitis: Secondary | ICD-10-CM | POA: Diagnosis not present

## 2023-06-07 DIAGNOSIS — E039 Hypothyroidism, unspecified: Secondary | ICD-10-CM | POA: Diagnosis not present

## 2023-06-07 DIAGNOSIS — Z1211 Encounter for screening for malignant neoplasm of colon: Secondary | ICD-10-CM

## 2023-06-07 DIAGNOSIS — N401 Enlarged prostate with lower urinary tract symptoms: Secondary | ICD-10-CM

## 2023-06-07 LAB — LIPID PANEL
Cholesterol: 98 mg/dL (ref 0–200)
HDL: 27.8 mg/dL — ABNORMAL LOW (ref >39.00)
LDL Cholesterol: 43 mg/dL (ref 0–99)
NonHDL: 70.53
Total CHOL/HDL Ratio: 4
Triglycerides: 137 mg/dL (ref 0.0–149.0)
VLDL: 27.4 mg/dL (ref 0.0–40.0)

## 2023-06-07 LAB — COMPREHENSIVE METABOLIC PANEL
ALT: 11 U/L (ref 0–53)
AST: 13 U/L (ref 0–37)
Albumin: 4.2 g/dL (ref 3.5–5.2)
Alkaline Phosphatase: 101 U/L (ref 39–117)
BUN: 9 mg/dL (ref 6–23)
CO2: 31 mEq/L (ref 19–32)
Calcium: 9.4 mg/dL (ref 8.4–10.5)
Chloride: 103 mEq/L (ref 96–112)
Creatinine, Ser: 0.83 mg/dL (ref 0.40–1.50)
GFR: 90.59 mL/min (ref >60.00)
Glucose, Bld: 91 mg/dL (ref 70–99)
Potassium: 4.4 mEq/L (ref 3.5–5.1)
Sodium: 141 mEq/L (ref 135–145)
Total Bilirubin: 0.7 mg/dL (ref 0.2–1.2)
Total Protein: 6.9 g/dL (ref 6.0–8.3)

## 2023-06-07 LAB — CBC
HCT: 42.2 % (ref 39.0–52.0)
Hemoglobin: 13.9 g/dL (ref 13.0–17.0)
MCHC: 33.1 g/dL (ref 30.0–36.0)
MCV: 95.3 fl (ref 78.0–100.0)
Platelets: 235 10*3/uL (ref 150.0–400.0)
RBC: 4.43 Mil/uL (ref 4.22–5.81)
RDW: 14 % (ref 11.5–15.5)
WBC: 5.5 10*3/uL (ref 4.0–10.5)

## 2023-06-07 LAB — T4, FREE: Free T4: 1.05 ng/dL (ref 0.60–1.60)

## 2023-06-07 LAB — TSH: TSH: 0.63 u[IU]/mL (ref 0.35–5.50)

## 2023-06-07 LAB — PSA, MEDICARE: PSA: 0.65 ng/ml (ref 0.10–4.00)

## 2023-06-07 NOTE — Assessment & Plan Note (Signed)
I have personally reviewed the Medicare Annual Wellness questionnaire and have noted 1. The patient's medical and social history 2. Their use of alcohol, tobacco or illicit drugs 3. Their current medications and supplements 4. The patient's functional ability including ADL's, fall risks, home safety risks and hearing or visual             impairment. 5. Diet and physical activities 6. Evidence for depression or mood disorders  The patients weight, height, BMI and visual acuity have been recorded in the chart I have made referrals, counseling and provided education to the patient based review of the above and I have provided the pt with a written personalized care plan for preventive services.  I have provided you with a copy of your personalized plan for preventive services. Please take the time to review along with your updated medication list.  Overdue for colonoscopy--will set up referral Check PSA Is exercising Prefers no vaccines

## 2023-06-07 NOTE — Assessment & Plan Note (Signed)
No angina On ASA 81mg , atorvastatin 40 daily

## 2023-06-07 NOTE — Progress Notes (Signed)
   Subjective:    Patient ID: Jeffrey Tucker, male    DOB: 29-Nov-1955, 67 y.o.   MRN: [id number]  HPI Here for Medicare wellness visit and follow up of chronic health conditions Reviewed advanced directives Reviewed other doctors---    Review of Systems     Objective:   Physical Exam         Assessment & Plan:   Vision Screening   Right eye Left eye Both eyes  Without correction     With correction 20/25 20/20 20/20   Hearing Screening - Comments:: Passed whisper test

## 2023-06-07 NOTE — Assessment & Plan Note (Signed)
Mild symptoms If worsens, start tamsulosin

## 2023-06-07 NOTE — Progress Notes (Signed)
Subjective:    Patient ID: Jeffrey Tucker, male    DOB: [birth year], 67 y.o.   MRN: [id number]  HPI Here for Medicare wellness visit and follow up of chronic health conditions Reviewed advanced directives  Reviewed other doctors---Dr Gollan--cardiology, Dr Sully Sink, [facility], Dr Kristian Covey, Dr Juengel--ENT/allergy No hospitalizations or surgery in the past year Regular exercise Vision is fine Hearing is fair--wife thinks it isn't great Rare glass of wine No tobacco No falls No depression or anhedonia Independent with instrumental ADLs No memory problems  F/eels great Has started running again Lost 13# No chest pain or SOB No dizziness or syncope No edema  No wheezing or cough regularly Only using albuterol rarely Not using the symbicort over the past 2 months  Has started allergy immunotherapy under Dr Elenore Rota Started montelukast--helping asthma and allergies  Continues on thyroid med  Wife is concerned about back is crooked Left shoulder is down some No back pain  Current Outpatient Medications on File Prior to Visit  Medication Sig Dispense Refill   albuterol (VENTOLIN HFA) 108 (90 Base) MCG/ACT inhaler Inhale 2 puffs into the lungs 3 (three) times daily as needed for wheezing or shortness of breath. 18 g 1   aspirin EC 81 MG EC tablet Take 1 tablet (81 mg total) by mouth daily. 30 tablet 0   atorvastatin (LIPITOR) 40 MG tablet Take 1 tablet (40 mg total) by mouth daily. 90 tablet 3   ketoconazole (NIZORAL) 2 % shampoo Massage into scalp 1-2 times per week, let sit several minutes before rinsing. 120 mL 11   levocetirizine (XYZAL) 5 MG tablet Take 5 mg by mouth every evening.     levothyroxine (SYNTHROID) 125 MCG tablet TAKE ONE TABLET BY MOUTH DAILY 90 tablet 3   mometasone (ELOCON) 0.1 % lotion Apply topically 2 (two) times a week. 60 mL 6   montelukast (SINGULAIR) 10 MG tablet Take 10 mg by mouth at bedtime.     No current facility-administered  medications on file prior to visit.    Allergies  Allergen Reactions   Tetracycline Hcl Other (See Comments)    Joint pain (and stiffened them, also)   Beta Adrenergic Blockers     Patient experienced abdomen vasospasms with Metoprolol succinate/tartrate and carvedilol.   Metoprolol Tartrate     Asthma and cough   Amlodipine Other (See Comments)    10mg  dose causes lower extremity swelling. Pt can tolerate 5mg  dose.    Past Medical History:  Diagnosis Date   Allergy    allergic rhinitis   Aortic root dilatation (HCC)    a. s/p surgical repair 08/27/2019   Asthma    mostly in childhood   Bicuspid aortic valve    a.  Status post bioprosthetic replacement 08/27/2019   BPH (benign prostatic hypertrophy)    CAD (coronary artery disease)    a. s/p 2-v CABG 08/2019 (SVG-OM, SVG--rPDA) *Grafts subsequently ligated due to bleeding   GERD (gastroesophageal reflux disease)    History of kidney stones    Hypertension    Hypertriglyceridemia    Hypothyroidism    Pericardial tamponade 09/25/2019   Severe aortic regurgitation    a. bicuspid aortic valve, b. s/p bio Bentall procedure 08/2019   Wears glasses     Past Surgical History:  Procedure Laterality Date   BENTALL PROCEDURE N/A 08/27/2019   Procedure: BENTALL PROCEDURE with a 27 mm KONECT Resilia Aortic Valved Conduit and 30 mm x 30 cm Hemashield Platinum straight graft.;  Surgeon:  Linden Dolin, MD;  Location: [facility] OR;  Service: Open Heart Surgery;  Laterality: N/A;   CARDIAC CATHETERIZATION  2006   [place]   CARDIOVASCULAR STRESS TEST  2006   [place]   CHOLECYSTECTOMY  2006   CORONARY ARTERY BYPASS GRAFT N/A 08/27/2019   Procedure: CORONARY ARTERY BYPASS GRAFTING (CABG) times 2 using right saphenous endoscopic vein harvesting to OM2 nad PDA.;  Surgeon: Linden Dolin, MD;  Location: [facility] OR;  Service: Open Heart Surgery;  Laterality: N/A;   CYSTOSCOPY W/ URETERAL STENT PLACEMENT  05/24/2017   Procedure: left;  Surgeon:  Hildred Laser, MD;  Location: [facility] ORS;  Service: Urology;;   CYSTOSCOPY W/ URETERAL STENT PLACEMENT Left 06/11/2017   Procedure: CYSTOSCOPY WITH STENT REPLACEMENT;  Surgeon: Vanna Scotland, MD;  Location: [facility] ORS;  Service: Urology;  Laterality: Left;   CYSTOSCOPY WITH BIOPSY Left 06/11/2017   Procedure: RENAL PELVIS TUMOR WITH BIOPSY WITH POSSIBLE LASER ABLATION;  Surgeon: Vanna Scotland, MD;  Location: [facility] ORS;  Service: Urology;  Laterality: Left;   CYSTOSCOPY WITH STENT PLACEMENT Left 05/24/2017   Procedure: , cystoscopy,left ureteral stent placement and left ureteroscopy attempted, retrograde pylogram;  Surgeon: Hildred Laser, MD;  Location: [facility] ORS;  Service: Urology;  Laterality: Left;   EYE SURGERY Bilateral    Cataract Extraction with IOL   FALSE ANEURYSM REPAIR  10/29/2019   REPAIR FALSE ANEURYSM RIGHT FEMORAL    FALSE ANEURYSM REPAIR Right 10/29/2019   Procedure: REPAIR FALSE ANEURYSM RIGHT FEMORAL;  Surgeon: Cephus Shelling, MD;  Location: [facility] OR;  Service: Vascular;  Laterality: Right;   HEMATOMA EVACUATION N/A 09/25/2019   Procedure: Evacuation Hematoma - Mediastinal Ligation of saphenous vein graft x two with Cardiopulmonary Bypass;  Surgeon: Purcell Nails, MD;  Location: [facility] OR;  Service: Thoracic;  Laterality: N/A;   IR THORACENTESIS ASP PLEURAL SPACE W/IMG GUIDE  09/02/2019   RIGHT/LEFT HEART CATH AND CORONARY ANGIOGRAPHY N/A 08/21/2019   Procedure: RIGHT/LEFT HEART CATH AND CORONARY ANGIOGRAPHY;  Surgeon: Antonieta Iba, MD;  Location: [facility] INV[facility]IVE [facility];  Service: Cardiovascular;  Laterality: N/A;   STERNOTOMY N/A 09/25/2019   Procedure: REDO STERNOTOMY;  Surgeon: Purcell Nails, MD;  Location: [facility] OR;  Service: Thoracic;  Laterality: N/A;   TEE WITHOUT CARDIOVERSION N/A 08/27/2019   Procedure: TRANSESOPHAGEAL ECHOCARDIOGRAM (TEE);  Surgeon: Linden Dolin, MD;  Location[facility];  Service: Open Heart Surgery;  Laterality: N/A;   TEE WITHOUT  CARDIOVERSION N/A 09/25/2019   Procedure: Transesophageal Echocardiogram (Tee);  Surgeon: Purcell Nails, MD;  Location: [name] OR;  Service: Thoracic;  Laterality: N/A;   URETEROSCOPY WITH HOLMIUM LASER LITHOTRIPSY Left 06/11/2017   Procedure: URETEROSCOPY WITH HOLMIUM LASER LITHOTRIPSY;  Surgeon: Vanna Scotland, MD;  Location: [facility] ORS;  Service: Urology;  Laterality: Left;   VEIN LIGATION AND STRIPPING Bilateral 1997    Family History  Problem Relation Age of Onset   Alcohol abuse Mother    Asthma Mother    Alcohol abuse Father    Prostate cancer Father    Liver cancer Paternal Grandmother    Colon cancer Neg Hx    Coronary artery disease Neg Hx    Hypertension Neg Hx    Diabetes Neg Hx    Bladder Cancer Neg Hx    Kidney cancer Neg Hx     Social History   Socioeconomic History   Marital status: Married    Spouse name: Domynic Tindal   Number of children: 2   Years  of education: Not on file   Highest education level: Not on file  Occupational History   Occupation:      Comment:     Occupation: Multimedia programmer estate    Comment: Retired   Occupation: Tax work---staff accountant    Comment: Full time now  Tobacco Use   Smoking status: Former    Current packs/day: 0.00    Types: Cigarettes    Quit date: 11/26/1978    Years since quitting: 44.5    Passive exposure: Past (as a child)   Smokeless tobacco: Never   Tobacco comments:    social smoked till 1980's  Vaping Use   Vaping status: Never Used  Substance and Sexual Activity   Alcohol use: No    Alcohol/week: 0.0 standard drinks of alcohol    Comment: none since 1984   Drug use: No   Sexual activity: Not on file  Other Topics Concern   Not on file  Social History Narrative   Has living will   Wife is health care POA---son would be alternate   Would accept resuscitation   Would accept feeding tube at first---but not if prolonged    Social Determinants of Health   Financial Resource Strain: Not on file  Food  Insecurity: Not on file  Transportation Needs: Not on file  Physical Activity: Not on file  Stress: Not on file  Social Connections: Not on file  Intimate Partner Violence: Not on file   Review of Systems Appetite is good Sleeps well Wears seat belt Teeth are good--sees dentist No suspicious skin lesions today No heartburn or dysphagia Bowels are fine--no blood Voids fair---slow stream. Does empty okay    Objective:   Physical Exam Constitutional:      Appearance: Normal appearance.  HENT:     Mouth/Throat:     Pharynx: No oropharyngeal exudate or posterior oropharyngeal erythema.  Eyes:     Conjunctiva/sclera: Conjunctivae normal.     Pupils: Pupils are equal, round, and reactive to light.  Cardiovascular:     Rate and Rhythm: Normal rate and regular rhythm.     Pulses: Normal pulses.     Heart sounds:     No gallop.     Comments: Soft aortic systolic murmur Pulmonary:     Effort: Pulmonary effort is normal.     Breath sounds: Normal breath sounds. No wheezing or rales.  Abdominal:     Palpations: Abdomen is soft.     Tenderness: There is no abdominal tenderness.  Musculoskeletal:     Cervical back: Neck supple.     Right lower leg: No edema.     Left lower leg: No edema.     Comments: Slight asymetry--right shoulder lower  Lymphadenopathy:     Cervical: No cervical adenopathy.  Skin:    Findings: No lesion or rash.  Neurological:     General: No focal deficit present.     Mental Status: He is alert and oriented to person, place, and time.     Comments: Word naming 10/1 minute Recall 2/3  Psychiatric:        Mood and Affect: Mood normal.        Behavior: Behavior normal.            Assessment & Plan:

## 2023-06-07 NOTE — Assessment & Plan Note (Signed)
Doing well off budesonide Now on montelukast Has albuterol for prn

## 2023-06-07 NOTE — Assessment & Plan Note (Signed)
Now on immunotherapy

## 2023-06-07 NOTE — Assessment & Plan Note (Signed)
Had repair No action

## 2023-06-07 NOTE — Assessment & Plan Note (Signed)
Will check labs on levothyroxine 125mg 

## 2023-07-08 ENCOUNTER — Other Ambulatory Visit: Payer: Self-pay | Admitting: Internal Medicine

## 2023-08-09 ENCOUNTER — Encounter: Payer: Self-pay | Admitting: Internal Medicine

## 2023-08-09 ENCOUNTER — Ambulatory Visit: Payer: Medicare Other | Attending: Cardiovascular Disease

## 2023-08-09 DIAGNOSIS — Z953 Presence of xenogenic heart valve: Secondary | ICD-10-CM | POA: Diagnosis present

## 2023-08-09 LAB — ECHOCARDIOGRAM COMPLETE
AR max vel: 3.06 cm2
AV Area VTI: 2.94 cm2
AV Area mean vel: 3.08 cm2
AV Mean grad: 4.8 mmHg
AV Peak grad: 9.8 mmHg
Ao pk vel: 1.56 m/s
Area-P 1/2: 3.77 cm2
Calc EF: 49.1 %
P 1/2 time: 298 ms
S' Lateral: 3.1 cm
Single Plane A2C EF: 46.2 %
Single Plane A4C EF: 50.6 %

## 2023-08-11 MED ORDER — VALACYCLOVIR HCL 1 G PO TABS: 1000.0000 mg | ORAL_TABLET | Freq: Every day | ORAL | 0 refills | Status: DC

## 2023-08-11 MED ORDER — VALACYCLOVIR HCL 1 G PO TABS: 2000.0000 mg | ORAL_TABLET | Freq: Every day | ORAL | 0 refills | Status: DC

## 2023-08-30 ENCOUNTER — Other Ambulatory Visit: Payer: Self-pay | Admitting: Internal Medicine

## 2023-08-30 DIAGNOSIS — Z1212 Encounter for screening for malignant neoplasm of rectum: Secondary | ICD-10-CM

## 2023-08-30 DIAGNOSIS — Z1211 Encounter for screening for malignant neoplasm of colon: Secondary | ICD-10-CM

## 2023-09-06 ENCOUNTER — Encounter: Payer: Self-pay | Admitting: Internal Medicine

## 2023-09-13 ENCOUNTER — Other Ambulatory Visit: Payer: Self-pay | Admitting: Dermatology

## 2023-09-13 DIAGNOSIS — L219 Seborrheic dermatitis, unspecified: Secondary | ICD-10-CM

## 2023-09-20 ENCOUNTER — Encounter: Payer: Self-pay | Admitting: Internal Medicine

## 2023-09-23 NOTE — Telephone Encounter (Signed)
Spoke to pt, pt states he'd give [place] a call back to schedule

## 2023-10-01 LAB — COLOGUARD: COLOGUARD: NEGATIVE

## 2023-10-08 NOTE — Progress Notes (Unsigned)
    Koraline Phillipson T. Jevante Hollibaugh, MD, CAQ Sports Medicine [facility] [address] [place], [address]  Phone: [phone]  FAX: [phone]  Jeffrey Tucker - 67 y.o. male  MRN [id number]  Date of Birth: 1956-07-20  Date: 10/09/2023  PCP: Karie Schwalbe, MD  Referral: Karie Schwalbe, MD  No chief complaint on file.  Subjective:   Jeffrey Tucker is a 67 y.o. very pleasant male patient with There is no height or weight on file to calculate BMI. who presents with the following:  He is a very pleasant patient and I saw him roughly 8 years ago for some shoulder pain.  He presents today with some ongoing Achilles tendinopathy and pain.    Review of Systems is noted in the HPI, as appropriate  Objective:   There were no vitals taken for this visit.  GEN: No acute distress; alert,appropriate. PULM: Breathing comfortably in no respiratory distress PSYCH: Normally interactive.   Laboratory and Imaging Data:  Assessment and Plan:   ***

## 2023-10-09 ENCOUNTER — Ambulatory Visit (INDEPENDENT_AMBULATORY_CARE_PROVIDER_SITE_OTHER): Payer: Medicare Other | Admitting: Family Medicine

## 2023-10-09 ENCOUNTER — Encounter: Payer: Self-pay | Admitting: Family Medicine

## 2023-10-09 ENCOUNTER — Ambulatory Visit: Payer: Medicare Other | Admitting: Family Medicine

## 2023-10-09 VITALS — BP 130/80 | HR 63 | Temp 98.0°F | Ht 66.0 in | Wt 162.0 lb

## 2023-10-09 DIAGNOSIS — M7662 Achilles tendinitis, left leg: Secondary | ICD-10-CM | POA: Diagnosis not present

## 2023-10-09 MED ORDER — NITROGLYCERIN 0.2 MG/HR TD PT24: MEDICATED_PATCH | TRANSDERMAL | 2 refills | Status: DC

## 2023-10-09 NOTE — Patient Instructions (Addendum)
Achilles Tendon Rehab  Start easy.  It is ok if there is mild discomfort, but if there is pain, then back off how much rehab you are doing.  For achilles rehab, you basically just need to concentrate on the ankle going up and down.  Start: Calf raises while seated First lower and then raise on both feet Assist lifting with hands and then slowly lower  Begin with 3 sets of 10 repetitions Increase by 5 repetitions every 3 days  Goal is 3 sets of 30 repetitions  If feels good at 3 sets of 30 - add backpack with 5 lbs  Increase by 5 lbs per week to max of 30 lbs   Alternative: If you have weights at home, you can put a dumbbell upright on the knee of the effected heel.  Go up with assistance from your hands, and then lower slowly.  If this is easy, then change to calf lowers standing on a step.  A heel cup of any brand will elevate the heel and take stress off of the achilles tendon. I particularly like the brand called Tuli's heel cups.  They cup the back of the heel, too.  They can be hard to find unless you can order off of the computer like on [place]. Any heel cup is ok, though, including ones you can find at any pharmacy    Nitroglycerin Protocol  Apply 1/4 nitroglycerin patch to affected area daily. Change position of patch within the affected area every 24 hours. You may experience a headache during the first 1-2 weeks of using the patch, these should subside. If you experience headaches after beginning nitroglycerin patch treatment, you may take your preferred over the counter pain reliever. Another side effect of the nitroglycerin patch is skin irritation or rash related to patch adhesive. Please notify our office if you develop more severe headaches or rash, and stop the patch. Tendon healing with nitroglycerin patch may require 12 to 24 weeks depending on the extent of injury. Men should not use if taking Viagra, Cialis, or Levitra.  Do not use if you have migraines or  rosacea.

## 2023-12-17 ENCOUNTER — Encounter: Payer: Self-pay | Admitting: Radiology

## 2023-12-17 ENCOUNTER — Emergency Department: Payer: Medicare Other

## 2023-12-17 ENCOUNTER — Ambulatory Visit: Payer: Self-pay | Admitting: Internal Medicine

## 2023-12-17 ENCOUNTER — Ambulatory Visit: Payer: Medicare Other | Admitting: Internal Medicine

## 2023-12-17 ENCOUNTER — Emergency Department
Admission: EM | Admit: 2023-12-17 | Discharge: 2023-12-17 | Disposition: A | Discharge status: Home / Self Care | Payer: Medicare Other | Attending: Emergency Medicine | Admitting: Emergency Medicine

## 2023-12-17 ENCOUNTER — Other Ambulatory Visit: Payer: Self-pay

## 2023-12-17 DIAGNOSIS — R202 Paresthesia of skin: Secondary | ICD-10-CM | POA: Insufficient documentation

## 2023-12-17 DIAGNOSIS — G8191 Hemiplegia, unspecified affecting right dominant side: Secondary | ICD-10-CM | POA: Diagnosis present

## 2023-12-17 DIAGNOSIS — Z951 Presence of aortocoronary bypass graft: Secondary | ICD-10-CM | POA: Insufficient documentation

## 2023-12-17 DIAGNOSIS — I2510 Atherosclerotic heart disease of native coronary artery without angina pectoris: Secondary | ICD-10-CM | POA: Insufficient documentation

## 2023-12-17 DIAGNOSIS — J45909 Unspecified asthma, uncomplicated: Secondary | ICD-10-CM | POA: Insufficient documentation

## 2023-12-17 DIAGNOSIS — R42 Dizziness and giddiness: Secondary | ICD-10-CM | POA: Diagnosis present

## 2023-12-17 DIAGNOSIS — I10 Essential (primary) hypertension: Secondary | ICD-10-CM | POA: Diagnosis not present

## 2023-12-17 DIAGNOSIS — I639 Cerebral infarction, unspecified: Secondary | ICD-10-CM | POA: Diagnosis present

## 2023-12-17 LAB — CBC
HCT: 43.1 % (ref 39.0–52.0)
Hemoglobin: 14.3 g/dL (ref 13.0–17.0)
MCH: 31.6 pg (ref 26.0–34.0)
MCHC: 33.2 g/dL (ref 30.0–36.0)
MCV: 95.4 fL (ref 80.0–100.0)
Platelets: 241 10*3/uL (ref 150–400)
RBC: 4.52 MIL/uL (ref 4.22–5.81)
RDW: 13.3 % (ref 11.5–15.5)
WBC: 5.8 10*3/uL (ref 4.0–10.5)
nRBC: 0 % (ref 0.0–0.2)

## 2023-12-17 LAB — BASIC METABOLIC PANEL
Anion gap: 9 (ref 5–15)
BUN: 14 mg/dL (ref 8–23)
CO2: 25 mmol/L (ref 22–32)
Calcium: 8.7 mg/dL — ABNORMAL LOW (ref 8.9–10.3)
Chloride: 103 mmol/L (ref 98–111)
Creatinine, Ser: 0.76 mg/dL (ref 0.61–1.24)
GFR, Estimated: >60 mL/min (ref >60)
Glucose, Bld: 88 mg/dL (ref 70–99)
Potassium: 4.4 mmol/L (ref 3.5–5.1)
Sodium: 137 mmol/L (ref 135–145)

## 2023-12-17 NOTE — ED Triage Notes (Signed)
Pt here with dizziness that started a few weeks ago. Pt has a hx of open heart surgery 4 years ago. Pt states he has been having right sided weakness that has been intermittent. Pt states at the moment he is not having the weakness but is here for an evaluation. Pt has hx of a TIA in 2022.

## 2023-12-17 NOTE — Telephone Encounter (Signed)
  Chief Complaint: One sided weakness (R side) on and off "dizziness" in head Symptoms: dizziness, right sided weakness that comes and goes over past several weeks "like the sensation of sleeping on them wrong" Frequency: intermittent Pertinent Negatives: Patient denies other symptom Disposition: [x] ED /[] Urgent Care (no appt availability in office) / [] Appointment(In office/virtual)/ []  [facility]/ [] Home Care/ [] Refused Recommended Disposition /[] [facility]/ []  Follow-up with PCP Additional Notes: Patient endorses cardiac history, recent start of nitroglycerin patch in November for tendinopathy, states "I just feel something is wrong, something is off and not right." Given patient hx and recent symptoms, advised patient of ED for assessment. Patient agreed, and verbalized understanding.      Copied from CRM [id number]. Topic: Clinical - Red Word Triage >> Dec 17, 2023  8:08 AM Gaetano Hawthorne wrote: [facility] that prompted transfer to Nurse Triage: Lately, Patient has felt a slight dizziness in my head every day and occasionally he feels a slight weakness in his right arm and once in his right leg. Something feels off. At first, he did not think much of it but it has been persistent. Reason for Disposition  [1] Weakness (i.e., paralysis, loss of muscle strength) of the face, arm / hand, or leg / foot on one side of the body AND [2] sudden onset AND [3] present now  (Exception: Bell's palsy suspected [i.e., weakness only on one side of the face, developing over hours to days, no other symptoms].)  Answer Assessment - Initial Assessment Questions 1. DESCRIPTION: "Describe how you are feeling."     "More weak like you sleep on your arm sensation" 2. SEVERITY: "How bad is it?"  "Can you stand and walk?"   - MILD (0-3): Feels weak or tired, but does not interfere with work, school or normal activities.   - MODERATE (4-7): Able to stand and walk; weakness interferes with work, school,  or normal activities.   - SEVERE (8-10): Unable to stand or walk; unable to do usual activities.     No interference, I just know something is not normal 3. ONSET: "When did these symptoms begin?" (e.g., hours, days, weeks, months)     Couple of weeks ago-at first "wasn't thinking much of it" 4. CAUSE: "What do you think is causing the weakness or fatigue?" (e.g., not drinking enough fluids, medical problem, trouble sleeping)     Not sure 5. NEW MEDICINES:  "Have you started on any new medicines recently?" (e.g., opioid pain medicines, benzodiazepines, muscle relaxants, antidepressants, antihistamines, neuroleptics, beta blockers)     No 6. OTHER SYMPTOMS: "Do you have any other symptoms?" (e.g., chest pain, fever, cough, SOB, vomiting, diarrhea, bleeding, other areas of pain)     "Can't explain-just something there"  Answer Assessment - Initial Assessment Questions See previous assessment questions  Protocols used: Weakness (Generalized) and Fatigue-A-AH, Neurologic Deficit-A-AH

## 2023-12-17 NOTE — Telephone Encounter (Signed)
Agree with sending him to ER as symptoms suspicious for stroke He has been triaged and will await the evaluation

## 2023-12-17 NOTE — Discharge Instructions (Addendum)
paresthesias please call the number provided for neurology to arrange a follow-up appointment as soon as possible.  Return to the emergency department for any return of/worsening symptoms especially for any weakness or numbness of any arm or leg confusion or slurred speech.  Otherwise please follow-up with your doctor for recheck/reevaluation as well.

## 2023-12-17 NOTE — ED Provider Notes (Signed)
[facility] Provider Note    Event Date/Time   First MD Initiated Contact with Patient 12/17/23 0935     (approximate)  History   Chief Complaint: Dizziness  HPI  Jeffrey Tucker is a 68 y.o. male with a past medical history of asthma, CAD status post CABG and valve replacement, hypertension and hyperlipidemia who presents to the emergency department intermittent dizziness and right-sided symptoms.  According to the patient for the last 2 months he has been intermittently experiencing some dizziness episodes as well as at times weakness in his right arm and right leg.  Patient states when the symptoms occur they are brief lasting less than a minute or so.  However given the patient's intermittent symptoms his doctor referred him to the emergency department for further workup.  Patient denies any symptoms currently.  Denies any headache denies any weakness or numbness confusion or slurred speech.  Physical Exam   Triage Vital Signs: ED Triage Vitals  Encounter Vitals Group     BP 12/17/23 0906 (!) 158/90     Systolic BP Percentile --      Diastolic BP Percentile --      Pulse Rate 12/17/23 0906 (!) 53     Resp 12/17/23 0906 18     Temp 12/17/23 0906 98.3 F (36.8 C)     Temp Source 12/17/23 0906 Oral     SpO2 12/17/23 0906 96 %     Weight 12/17/23 0903 162 lb 0.6 oz (73.5 kg)     Height 12/17/23 0903 5\' 6"  (1.676 m)     Head Circumference --      Peak Flow --      Pain Score 12/17/23 0903 0     Pain Loc --      Pain Education --      Exclude from Growth Chart --     Most recent vital signs: Vitals:   12/17/23 0906  BP: (!) 158/90  Pulse: (!) 53  Resp: 18  Temp: 98.3 F (36.8 C)  SpO2: 96%    General: Awake, no distress.  CV:  Good peripheral perfusion.  Regular rate and rhythm  Resp:  Normal effort.  Equal breath sounds bilaterally.  Abd:  No distention.  Soft, nontender.  No rebound or guarding. Other:  Equal grip strength bilaterally.   No pronator drift.  No cranial nerve deficits.  Ambulates without issue.   ED Results / Procedures / Treatments   EKG  EKG viewed and interpreted by myself shows sinus bradycardia 57 bpm with a narrow QRS, normal axis, normal intervals, no concerning ST changes.  RADIOLOGY  I have reviewed interpreted CT head images.  No obvious bleed seen on my evaluation. Radiology is read the CT scan is negative for acute finding.  There is an unchanged 9 mm likely meningioma.   MEDICATIONS ORDERED IN ED: Medications - No data to display   IMPRESSION / MDM / ASSESSMENT AND PLAN / ED COURSE  I reviewed the triage vital signs and the nursing notes.  Patient's presentation is most consistent with acute presentation with potential threat to life or bodily function.  Patient presents to the emergency department for intermittent dizziness and right-sided symptoms ongoing over the last 2 to 3 months.  Patient states his symptoms are quite intermittent and very brief less than a minute or so when they occur but always on the right side.  Currently patient denies any symptoms.  Physical exam and neurological exam reassuring.  Vital signs reassuring besides slight hypertension.  CT scan shows no significant finding.  Patient's lab work shows a normal CBC with a reassuring chemistry.  However given the patient's intermittent right-sided symptoms and intermittent dizziness over the last 2 to 3 months we will proceed with an MRI of the brain as well as MRA of the head and neck to further evaluate.  Patient is agreeable to this plan.  Patient's workup is reassuring normal CBC, reassuring chemistry, reassuring CT scan of the head.  Patient's MRI as well as MRA of the head and neck are normal.  Given the patient's reassuring workup I believe the patient safe for discharge home, as he is asymptomatic currently however I do believe the patient needs to follow-up with neurology for further workup and evaluation.  Patient  agreeable to plan of care.  FINAL CLINICAL IMPRESSION(S) / ED DIAGNOSES   Dizziness Paresthesias   Note:  This document was prepared using Dragon voice recognition software and may include unintentional dictation errors.   Minna Antis, MD 12/17/23 1247

## 2023-12-18 ENCOUNTER — Telehealth: Payer: Self-pay

## 2023-12-18 NOTE — Transitions of Care (Post Inpatient/ED Visit) (Signed)
   12/18/2023  Name: Jeffrey Tucker MRN: [id number] DOB: Jun 02, 1956  Today's TOC FU Call Status: Today's TOC FU Call Status:: Successful TOC FU Call Completed TOC FU Call Complete Date: 12/18/23 Patient's Name and Date of Birth confirmed.  Transition Care Management Follow-up Telephone Call Date of Discharge: 12/17/23 Discharge Facility: [facility]) Type of Discharge: Emergency Department Reason for ED Visit: Other: (dizziness) How have you been since you were released from the hospital?: Better Any questions or concerns?: No  Items Reviewed: Did you receive and understand the discharge instructions provided?: Yes Medications obtained,verified, and reconciled?: Yes (Medications Reviewed) Any new allergies since your discharge?: No Dietary orders reviewed?: Yes Do you have support at home?: No  Medications Reviewed Today: Medications Reviewed Today     Reviewed by Karena Addison, LPN (Licensed Practical Nurse) on 12/18/23 at 1135  Med List Status: <None>   Medication Order Taking? Sig Documenting Provider Last Dose Status Informant  albuterol (VENTOLIN HFA) 108 (90 Base) MCG/ACT inhaler [id number] No Inhale 2 puffs into the lungs 3 (three) times daily as needed for wheezing or shortness of breath. Karie Schwalbe, MD Taking Active   aspirin EC 81 MG EC tablet [id number] No Take 1 tablet (81 mg total) by mouth daily. Swayze, Ava, DO Taking Active Self  atorvastatin (LIPITOR) 40 MG tablet [id number] No Take 1 tablet (40 mg total) by mouth daily. Antonieta Iba, MD Taking Active   ketoconazole (NIZORAL) 2 % shampoo [id number] No Massage into scalp 1-2 times per week, let sit several minutes before rinsing. Willeen Niece, MD Taking Active   levocetirizine (XYZAL) 5 MG tablet [id number] No Take 5 mg by mouth every evening. [provider] Taking Active Self  levothyroxine (SYNTHROID) 125 MCG tablet [id number] No TAKE 1 TABLET BY MOUTH DAILY Karie Schwalbe, MD Taking Active   mometasone (ELOCON) 0.1 % lotion [id number] No APPLY TOPICALLY 2 TIMES A Margaretann Loveless, MD Taking Active   montelukast (SINGULAIR) 10 MG tablet [id number] No Take 10 mg by mouth at bedtime. [provider] Taking Active   nitroGLYCERIN (NITRODUR - DOSED IN MG/24 HR) 0.2 mg/hr patch [id number]  Apply 1/4 patch to affected area as directed by MD and change every 24 hours. Copland, Karleen Hampshire, MD  Active   valACYclovir (VALTREX) 1000 MG tablet [id number] No Take 2 tablets (2,000 mg total) by mouth daily. For cold sore--then repeat in 12 hours Karie Schwalbe, MD Taking Active             Home Care and Equipment/Supplies: Were Home Health Services Ordered?: NA Any new equipment or medical supplies ordered?: NA  Functional Questionnaire: Do you need assistance with bathing/showering or dressing?: No Do you need assistance with meal preparation?: No Do you need assistance with eating?: No Do you have difficulty maintaining continence: No Do you need assistance with getting out of bed/getting out of a chair/moving?: No Do you have difficulty managing or taking your medications?: No  Follow up appointments reviewed: PCP Follow-up appointment confirmed?: Yes Date of PCP follow-up appointment?: 12/23/23 Follow-up Provider: [facility] Follow-up appointment confirmed?: No Reason Specialist Follow-Up Not Confirmed: Patient has Specialist Provider Number and will Call for Appointment Do you need transportation to your follow-up appointment?: No Do you understand care options if your condition(s) worsen?: Yes-patient verbalized understanding    SIGNATURE Karena Addison, LPN [facility] Nurse Health Advisor Direct Dial [phone]

## 2023-12-23 ENCOUNTER — Ambulatory Visit (INDEPENDENT_AMBULATORY_CARE_PROVIDER_SITE_OTHER): Payer: Medicare Other | Admitting: Internal Medicine

## 2023-12-23 ENCOUNTER — Encounter: Payer: Self-pay | Admitting: Internal Medicine

## 2023-12-23 ENCOUNTER — Other Ambulatory Visit: Payer: Self-pay | Admitting: Internal Medicine

## 2023-12-23 VITALS — BP 138/86 | HR 64 | Temp 97.7°F | Ht 66.0 in | Wt 170.0 lb

## 2023-12-23 DIAGNOSIS — R202 Paresthesia of skin: Secondary | ICD-10-CM | POA: Insufficient documentation

## 2023-12-23 DIAGNOSIS — E538 Deficiency of other specified B group vitamins: Secondary | ICD-10-CM

## 2023-12-23 LAB — VITAMIN B12: Vitamin B-12: 199 pg/mL — ABNORMAL LOW (ref 211–911)

## 2023-12-23 NOTE — Progress Notes (Signed)
Subjective:    Patient ID: Jeffrey Tucker, male    DOB: 10-26-1956, 68 y.o.   MRN: [id number]  HPI Here for ER follow up  He had first spell a couple of years ago---numbness in upper right lip for a minute Did see neurologist then Then in the past month or so---some dizziness ("like something not normal") 3-4 episodes of numbness in right arm----he will just shake it out and it improves Worst spell was at work --couldn't use right fingers on computer. Did resolve in a few minutes Then a month ago--right leg did not feel right-----"lost some power"-----also resolved briefly All on right side of body  He called in after wife heard about these No new symptoms at that point Had benign MRI and MR angiogram (without contrast) Labs were unremarkable  Only new treatment is allergy injections weekly with Dr Hardie Lora last summer Also on montelukast--which has helped No other new medications  Current Outpatient Medications on File Prior to Visit  Medication Sig Dispense Refill   albuterol (VENTOLIN HFA) 108 (90 Base) MCG/ACT inhaler Inhale 2 puffs into the lungs 3 (three) times daily as needed for wheezing or shortness of breath. 18 g 1   aspirin EC 81 MG EC tablet Take 1 tablet (81 mg total) by mouth daily. 30 tablet 0   atorvastatin (LIPITOR) 40 MG tablet Take 1 tablet (40 mg total) by mouth daily. 90 tablet 3   ketoconazole (NIZORAL) 2 % shampoo Massage into scalp 1-2 times per week, let sit several minutes before rinsing. 120 mL 11   levocetirizine (XYZAL) 5 MG tablet Take 5 mg by mouth every evening.     levothyroxine (SYNTHROID) 125 MCG tablet TAKE 1 TABLET BY MOUTH DAILY 90 tablet 3   mometasone (ELOCON) 0.1 % lotion APPLY TOPICALLY 2 TIMES A WEEK 60 mL 6   montelukast (SINGULAIR) 10 MG tablet Take 10 mg by mouth at bedtime.     nitroGLYCERIN (NITRODUR - DOSED IN MG/24 HR) 0.2 mg/hr patch Apply 1/4 patch to affected area as directed by MD and change every 24 hours. 30 patch 2    UNABLE TO FIND Med Name: Allergy Shots weekly     valACYclovir (VALTREX) 1000 MG tablet Take 2 tablets (2,000 mg total) by mouth daily. For cold sore--then repeat in 12 hours 20 tablet 0   No current facility-administered medications on file prior to visit.    Allergies  Allergen Reactions   Tetracycline Hcl Other (See Comments)    Joint pain (and stiffened them, also)   Beta Adrenergic Blockers     Patient experienced abdomen vasospasms with Metoprolol succinate/tartrate and carvedilol.   Metoprolol Tartrate     Asthma and cough   Amlodipine Other (See Comments)    10mg  dose causes lower extremity swelling. Pt can tolerate 5mg  dose.    Past Medical History:  Diagnosis Date   Allergy    allergic rhinitis   Aortic root dilatation (HCC)    a. s/p surgical repair 08/27/2019   Asthma    mostly in childhood   Bicuspid aortic valve    a.  Status post bioprosthetic replacement 08/27/2019   BPH (benign prostatic hypertrophy)    CAD (coronary artery disease)    a. s/p 2-v CABG 08/2019 (SVG-OM, SVG--rPDA) *Grafts subsequently ligated due to bleeding   GERD (gastroesophageal reflux disease)    History of kidney stones    Hypertension    Hypertriglyceridemia    Hypothyroidism    Pericardial tamponade 09/25/2019  Severe aortic regurgitation    a. bicuspid aortic valve, b. s/p bio Bentall procedure 08/2019   Wears glasses     Past Surgical History:  Procedure Laterality Date   BENTALL PROCEDURE N/A 08/27/2019   Procedure: BENTALL PROCEDURE with a 27 mm KONECT Resilia Aortic Valved Conduit and 30 mm x 30 cm Hemashield Platinum straight graft.;  Surgeon: Linden Dolin, MD;  Location: [facility] OR;  Service: Open Heart Surgery;  Laterality: N/A;   CARDIAC CATHETERIZATION  2006   [place]   CARDIOVASCULAR STRESS TEST  2006   [place]   CHOLECYSTECTOMY  2006   CORONARY ARTERY BYPASS GRAFT N/A 08/27/2019   Procedure: CORONARY ARTERY BYPASS GRAFTING (CABG) times 2 using right saphenous  endoscopic vein harvesting to OM2 nad PDA.;  Surgeon: Linden Dolin, MD;  Location: [facility] OR;  Service: Open Heart Surgery;  Laterality: N/A;   CYSTOSCOPY W/ URETERAL STENT PLACEMENT  05/24/2017   Procedure: left;  Surgeon: Hildred Laser, MD;  Location: [facility] ORS;  Service: Urology;;   CYSTOSCOPY W/ URETERAL STENT PLACEMENT Left 06/11/2017   Procedure: CYSTOSCOPY WITH STENT REPLACEMENT;  Surgeon: Vanna Scotland, MD;  Location: [facility] ORS;  Service: Urology;  Laterality: Left;   CYSTOSCOPY WITH BIOPSY Left 06/11/2017   Procedure: RENAL PELVIS TUMOR WITH BIOPSY WITH POSSIBLE LASER ABLATION;  Surgeon: Vanna Scotland, MD;  Location: [facility] ORS;  Service: Urology;  Laterality: Left;   CYSTOSCOPY WITH STENT PLACEMENT Left 05/24/2017   Procedure: , cystoscopy,left ureteral stent placement and left ureteroscopy attempted, retrograde pylogram;  Surgeon: Hildred Laser, MD;  Location: [facility] ORS;  Service: Urology;  Laterality: Left;   EYE SURGERY Bilateral    Cataract Extraction with IOL   FALSE ANEURYSM REPAIR  10/29/2019   REPAIR FALSE ANEURYSM RIGHT FEMORAL    FALSE ANEURYSM REPAIR Right 10/29/2019   Procedure: REPAIR FALSE ANEURYSM RIGHT FEMORAL;  Surgeon: Cephus Shelling, MD;  Location: [facility] OR;  Service: Vascular;  Laterality: Right;   HEMATOMA EVACUATION N/A 09/25/2019   Procedure: Evacuation Hematoma - Mediastinal Ligation of saphenous vein graft x two with Cardiopulmonary Bypass;  Surgeon: Purcell Nails, MD;  Location: [facility] OR;  Service: Thoracic;  Laterality: N/A;   IR THORACENTESIS ASP PLEURAL SPACE W/IMG GUIDE  09/02/2019   RIGHT/LEFT HEART CATH AND CORONARY ANGIOGRAPHY N/A 08/21/2019   Procedure: RIGHT/LEFT HEART CATH AND CORONARY ANGIOGRAPHY;  Surgeon: Antonieta Iba, MD;  Location: [facility] INVASIVE CV LAB;  Service: Cardiovascular;  Laterality: N/A;   STERNOTOMY N/A 09/25/2019   Procedure: REDO STERNOTOMY;  Surgeon: Purcell Nails, MD;  Location[facility] OR;  Service: Thoracic;   Laterality: N/A;   TEE WITHOUT CARDIOVERSION N/A 08/27/2019   Procedure: TRANSESOPHAGEAL ECHOCARDIOGRAM (TEE);  Surgeon: Linden Dolin, MD;  Location: [facility] OR;  Service: Open Heart Surgery;  Laterality: N/A;   TEE WITHOUT CARDIOVERSION N/A 09/25/2019   Procedure: Transesophageal Echocardiogram (Tee);  Surgeon: Purcell Nails, MD;  Location: [facility] OR;  Service: Thoracic;  Laterality: N/A;   URETEROSCOPY WITH HOLMIUM LASER LITHOTRIPSY Left 06/11/2017   Procedure: URETEROSCOPY WITH HOLMIUM LASER LITHOTRIPSY;  Surgeon: Vanna Scotland, MD;  Location: [facility] ORS;  Service: Urology;  Laterality: Left;   VEIN LIGATION AND STRIPPING Bilateral 1997    Family History  Problem Relation Age of Onset   Alcohol abuse Mother    Asthma Mother    Alcohol abuse Father    Prostate cancer Father    Liver cancer Paternal Grandmother    Colon cancer Neg Hx  Coronary artery disease Neg Hx    Hypertension Neg Hx    Diabetes Neg Hx    Bladder Cancer Neg Hx    Kidney cancer Neg Hx     Social History   Socioeconomic History   Marital status: Married    Spouse name: Bain Whichard   Number of children: 2   Years of education: Not on file   Highest education level: Associate degree: academic program  Occupational History   Occupation:      Comment:     Occupation: Multimedia programmer estate    Comment: Retired   Occupation: Tax work---staff accountant    Comment: Full time now  Tobacco Use   Smoking status: Former    Current packs/day: 0.00    Types: Cigarettes    Quit date: 11/26/1978    Years since quitting: 45.1    Passive exposure: Past (as a child)   Smokeless tobacco: Never   Tobacco comments:    social smoked till 1980's  Vaping Use   Vaping status: Never Used  Substance and Sexual Activity   Alcohol use: No    Alcohol/week: 0.0 standard drinks of alcohol    Comment: none since 1984   Drug use: No   Sexual activity: Not on file  Other Topics Concern   Not on file  Social History  Narrative   Has living will   Wife is health care POA---son would be alternate   Would accept resuscitation   Would accept feeding tube at first---but not if prolonged    Social Drivers of Health   Financial Resource Strain: Low Risk  (12/14/2023)   Overall Financial Resource Strain (CARDIA)    Difficulty of Paying Living Expenses: Not hard at all  Food Insecurity: No Food Insecurity (12/14/2023)   Hunger Vital Sign    Worried About Running Out of Food in the Last Year: Never true    Ran Out of Food in the Last Year: Never true  Transportation Needs: No Transportation Needs (12/14/2023)   PRAPARE - Administrator, Civil Service (Medical): No    Lack of Transportation (Non-Medical): No  Physical Activity: Insufficiently Active (12/14/2023)   Exercise Vital Sign    Days of Exercise per Week: 1 day    Minutes of Exercise per Session: 40 min  Stress: No Stress Concern Present (12/14/2023)   [employer] of Occupational Health - Occupational Stress Questionnaire    Feeling of Stress : Not at all  Social Connections: Socially Integrated (12/14/2023)   Social Connection and Isolation Panel [NHANES]    Frequency of Communication with Friends and Family: More than three times a week    Frequency of Social Gatherings with Friends and Family: Twice a week    Attends Religious Services: More than 4 times per year    Active Member of [employer] or Organizations: Yes    Attends Engineer, structural: More than 4 times per year    Marital Status: Married  Catering manager Violence: Not on file   Review of Systems Sleeps okay Appetite is fine    Objective:   Physical Exam Constitutional:      Appearance: Normal appearance.  Neurological:     General: No focal deficit present.     Mental Status: He is alert.  Psychiatric:        Mood and Affect: Mood normal.        Behavior: Behavior normal.  Assessment & Plan:

## 2023-12-23 NOTE — Assessment & Plan Note (Signed)
Also in lip Happened first about 3 years ago--saw Ihor Austin at [facility] neuro Now new symptoms---no clear etiology (though could be related to montelukast) Reassured by benign MRI and MR angiogram---ER records are reviewed Discussed options---observe, try off montelukast, go back to neurology  He would like to observe for now He will send me a message if he has any more symptoms and we can decide what to do next Will check vitamin B12--just in case

## 2024-01-30 ENCOUNTER — Encounter: Payer: Self-pay | Admitting: Internal Medicine

## 2024-01-30 ENCOUNTER — Other Ambulatory Visit (INDEPENDENT_AMBULATORY_CARE_PROVIDER_SITE_OTHER): Payer: Medicare Other

## 2024-01-30 DIAGNOSIS — E538 Deficiency of other specified B group vitamins: Secondary | ICD-10-CM

## 2024-01-30 LAB — VITAMIN B12: Vitamin B-12: 640 pg/mL (ref 211–911)

## 2024-02-25 ENCOUNTER — Other Ambulatory Visit: Payer: Self-pay | Admitting: Internal Medicine

## 2024-03-02 ENCOUNTER — Other Ambulatory Visit: Payer: Self-pay | Admitting: Internal Medicine

## 2024-03-02 NOTE — Telephone Encounter (Signed)
 Copied from CRM [id number]. Topic: Clinical - Medication Refill >> Mar 02, 2024  3:23 PM Shelah Lewandowsky wrote: Most Recent Primary Care Visit:  Provider: [facility]  Department: [facility]  Visit Type: LAB  Date: 01/30/2024  Medication: albuterol (VENTOLIN HFA) 108 (90 Base) MCG/ACT inhaler  Has the patient contacted their pharmacy? Yes (Agent: If no, request that the patient contact the pharmacy for the refill. If patient does not wish to contact the pharmacy document the reason why and proceed with request.) (Agent: If yes, when and what did the pharmacy advise?)  Is this the correct pharmacy for this prescription? Yes If no, delete pharmacy and type the correct one.  This is the patient's preferred pharmacy:  [facility] [id number] [place], [place] - [address] [place] [address] Phone: [phone] Fax: [phone]     Has the prescription been filled recently? No  Is the patient out of the medication? Yes  Has the patient been seen for an appointment in the last year OR does the patient have an upcoming appointment? Yes  Can we respond through MyChart? Yes  Agent: Please be advised that Rx refills may take up to 3 business days. We ask that you follow-up with your pharmacy.

## 2024-03-03 MED ORDER — ALBUTEROL SULFATE HFA 108 (90 BASE) MCG/ACT IN AERS: 2.0000 | INHALATION_SPRAY | Freq: Three times a day (TID) | RESPIRATORY_TRACT | 1 refills | Status: AC | PRN

## 2024-03-09 ENCOUNTER — Encounter: Payer: Self-pay | Admitting: Internal Medicine

## 2024-03-18 ENCOUNTER — Encounter: Payer: Self-pay | Admitting: Internal Medicine

## 2024-03-19 MED ORDER — FLUTICASONE-SALMETEROL 100-50 MCG/ACT IN AEPB: 1.0000 | INHALATION_SPRAY | Freq: Two times a day (BID) | RESPIRATORY_TRACT | 1 refills | Status: AC

## 2024-04-17 ENCOUNTER — Encounter: Payer: Self-pay | Admitting: Internal Medicine

## 2024-04-29 ENCOUNTER — Other Ambulatory Visit: Payer: Self-pay | Admitting: Dermatology

## 2024-04-30 ENCOUNTER — Encounter: Payer: Self-pay | Admitting: Internal Medicine

## 2024-04-30 DIAGNOSIS — Z1211 Encounter for screening for malignant neoplasm of colon: Secondary | ICD-10-CM

## 2024-05-01 NOTE — Telephone Encounter (Signed)
 See other mychart message. Patient requesting referral to location mentioned in message for Colonoscopy.

## 2024-05-02 ENCOUNTER — Other Ambulatory Visit: Payer: Self-pay | Admitting: Dermatology

## 2024-05-08 ENCOUNTER — Ambulatory Visit

## 2024-05-24 ENCOUNTER — Encounter: Payer: Self-pay | Admitting: Internal Medicine

## 2024-06-08 ENCOUNTER — Ambulatory Visit

## 2024-06-12 ENCOUNTER — Encounter: Payer: Medicare Other | Admitting: Internal Medicine

## 2024-06-15 ENCOUNTER — Telehealth: Payer: Self-pay | Admitting: Internal Medicine

## 2024-06-15 ENCOUNTER — Encounter: Payer: Self-pay | Admitting: Internal Medicine

## 2024-06-15 ENCOUNTER — Ambulatory Visit: Payer: Self-pay | Admitting: Internal Medicine

## 2024-06-15 ENCOUNTER — Ambulatory Visit (INDEPENDENT_AMBULATORY_CARE_PROVIDER_SITE_OTHER): Payer: Medicare Other | Admitting: Internal Medicine

## 2024-06-15 VITALS — BP 120/84 | HR 61 | Temp 97.8°F | Ht 65.75 in | Wt 169.0 lb

## 2024-06-15 DIAGNOSIS — Z0000 Encounter for general adult medical examination without abnormal findings: Secondary | ICD-10-CM

## 2024-06-15 DIAGNOSIS — Z125 Encounter for screening for malignant neoplasm of prostate: Secondary | ICD-10-CM | POA: Diagnosis not present

## 2024-06-15 DIAGNOSIS — J4520 Mild intermittent asthma, uncomplicated: Secondary | ICD-10-CM | POA: Diagnosis not present

## 2024-06-15 DIAGNOSIS — E039 Hypothyroidism, unspecified: Secondary | ICD-10-CM

## 2024-06-15 DIAGNOSIS — I2510 Atherosclerotic heart disease of native coronary artery without angina pectoris: Secondary | ICD-10-CM

## 2024-06-15 DIAGNOSIS — I712 Thoracic aortic aneurysm, without rupture, unspecified: Secondary | ICD-10-CM

## 2024-06-15 DIAGNOSIS — E538 Deficiency of other specified B group vitamins: Secondary | ICD-10-CM

## 2024-06-15 LAB — COMPREHENSIVE METABOLIC PANEL WITH GFR
ALT: 20 U/L (ref 0–53)
AST: 18 U/L (ref 0–37)
Albumin: 4.4 g/dL (ref 3.5–5.2)
Alkaline Phosphatase: 87 U/L (ref 39–117)
BUN: 16 mg/dL (ref 6–23)
CO2: 27 meq/L (ref 19–32)
Calcium: 9.3 mg/dL (ref 8.4–10.5)
Chloride: 104 meq/L (ref 96–112)
Creatinine, Ser: 0.88 mg/dL (ref 0.40–1.50)
GFR: 88.37 mL/min (ref >60.00)
Glucose, Bld: 99 mg/dL (ref 70–99)
Potassium: 4 meq/L (ref 3.5–5.1)
Sodium: 140 meq/L (ref 135–145)
Total Bilirubin: 0.6 mg/dL (ref 0.2–1.2)
Total Protein: 6.9 g/dL (ref 6.0–8.3)

## 2024-06-15 LAB — LIPID PANEL
Cholesterol: 152 mg/dL (ref 0–200)
HDL: 28.4 mg/dL — ABNORMAL LOW (ref >39.00)
LDL Cholesterol: 64 mg/dL (ref 0–99)
NonHDL: 123.36
Total CHOL/HDL Ratio: 5
Triglycerides: 299 mg/dL — ABNORMAL HIGH (ref 0.0–149.0)
VLDL: 59.8 mg/dL — ABNORMAL HIGH (ref 0.0–40.0)

## 2024-06-15 LAB — CBC
HCT: 42.3 % (ref 39.0–52.0)
Hemoglobin: 14.3 g/dL (ref 13.0–17.0)
MCHC: 33.8 g/dL (ref 30.0–36.0)
MCV: 95 fl (ref 78.0–100.0)
Platelets: 235 K/uL (ref 150.0–400.0)
RBC: 4.45 Mil/uL (ref 4.22–5.81)
RDW: 14.3 % (ref 11.5–15.5)
WBC: 4.8 K/uL (ref 4.0–10.5)

## 2024-06-15 LAB — PSA, MEDICARE: PSA: 0.88 ng/mL (ref 0.10–4.00)

## 2024-06-15 LAB — VITAMIN B12: Vitamin B-12: 1297 pg/mL — ABNORMAL HIGH (ref 211–911)

## 2024-06-15 LAB — TSH: TSH: 2.73 u[IU]/mL (ref 0.35–5.50)

## 2024-06-15 LAB — T4, FREE: Free T4: 0.87 ng/dL (ref 0.60–1.60)

## 2024-06-15 NOTE — Assessment & Plan Note (Signed)
 I have personally reviewed the Medicare Annual Wellness questionnaire and have noted 1. The patient's medical and social history 2. Their use of alcohol, tobacco or illicit drugs 3. Their current medications and supplements 4. The patient's functional ability including ADL's, fall risks, home safety risks and hearing or visual             impairment. 5. Diet and physical activities 6. Evidence for depression or mood disorders  The patients weight, height, BMI and visual acuity have been recorded in the chart I have made referrals, counseling and provided education to the patient based review of the above and I have provided the pt with a written personalized care plan for preventive services.  I have provided you with a copy of your personalized plan for preventive services. Please take the time to review along with your updated medication list.  Colonoscopy coming up Will check PSA Still prefers no immunizations Exercises regularly

## 2024-06-15 NOTE — Telephone Encounter (Signed)
 During checkout, pt stated Dr. Jimmy suggested transferring his care to Dr. KANDICE, since he's retiring. Per Dr. Letvak, pt should return in 1 yr for cpe. Is this TOC okay? Call back # [phone]

## 2024-06-15 NOTE — Assessment & Plan Note (Signed)
 Seems euthyroid on the levothyroxine  125 mcg daily

## 2024-06-15 NOTE — Assessment & Plan Note (Signed)
 Takes OTC daily.

## 2024-06-15 NOTE — Assessment & Plan Note (Signed)
 No problems since repair

## 2024-06-15 NOTE — Assessment & Plan Note (Signed)
No symptoms On statin and ASA

## 2024-06-15 NOTE — Telephone Encounter (Signed)
Ok to do this. Thanks. 

## 2024-06-15 NOTE — Progress Notes (Signed)
 Hearing Screening - Comments:: Saw audiology in Fall 2024. Was advised of some hearing loss.  Vision Screening - Comments:: July 2025

## 2024-06-15 NOTE — Assessment & Plan Note (Signed)
 Does well with montelukast  and occasional albuterol 

## 2024-06-15 NOTE — Progress Notes (Signed)
 Subjective:    Patient ID: Jeffrey Tucker, male    DOB: 01-27-1956, 68 y.o.   MRN: [id number]  HPI Here for Medicare wellness visit and follow up of chronic health conditions Reviewed advanced directives Reviewed other doctors---Dr Stewart--derm, Dr Deirdre, Dr Juengel--ENT/allergy, Dr Gollan--cardiology, Dr Dorenda, Dr Carmelita Leopard regularly and some resistance work No hospitalizations or surgery in the past year Did have the one ER visit--no recurrence of the arm numbness stuff No tobacco or alcohol No falls No depression or anhedonia Vision is fine--recent exam. Did have laser Rx for some scarring post cataracts--by Dr Lavonia Hearing is mildly decreased. Not ready for aides yet Independent with instrumental ADLs No sig memory issues  Doing well Still working  No heart problems No chest pain or SOB No change in exercise tolerance No palpitations No dizziness or syncope  Energy levels are good Weight is stable  Asthma is quiet Usually needs albuterol  only in the fall No regular cough---no wheezing  Current Outpatient Medications on File Prior to Visit  Medication Sig Dispense Refill   albuterol  (VENTOLIN  HFA) 108 (90 Base) MCG/ACT inhaler Inhale 2 puffs into the lungs 3 (three) times daily as needed for wheezing or shortness of breath. 18 g 1   aspirin  EC 81 MG EC tablet Take 1 tablet (81 mg total) by mouth daily. 30 tablet 0   atorvastatin  (LIPITOR ) 40 MG tablet Take 1 tablet (40 mg total) by mouth daily. 90 tablet 3   fluticasone -salmeterol (ADVAIR) 100-50 MCG/ACT AEPB Inhale 1 puff into the lungs 2 (two) times daily. 60 each 1   ketoconazole  (NIZORAL ) 2 % shampoo Massage into scalp 1-2 times per week, let sit several minutes before rinsing. 120 mL 11   levocetirizine (XYZAL) 5 MG tablet Take 5 mg by mouth every evening.     levothyroxine  (SYNTHROID ) 125 MCG tablet TAKE 1 TABLET BY MOUTH DAILY 90 tablet 3   mometasone  (ELOCON ) 0.1 % lotion APPLY  TOPICALLY 2 TIMES A WEEK 60 mL 6   montelukast  (SINGULAIR ) 10 MG tablet Take 10 mg by mouth at bedtime.     UNABLE TO FIND Med Name: Allergy Shots weekly     valACYclovir  (VALTREX ) 1000 MG tablet TAKE 2 TABLETS FOR COLD SORE THEN REPEAT 2 TABLETS IN 12 HOURS 20 tablet 0   No current facility-administered medications on file prior to visit.    Allergies  Allergen Reactions   Tetracycline Hcl Other (See Comments)    Joint pain (and stiffened them, also)   Beta Adrenergic Blockers     Patient experienced abdomen vasospasms with Metoprolol  succinate/tartrate and carvedilol .   Metoprolol  Tartrate     Asthma and cough   Amlodipine  Other (See Comments)    10mg  dose causes lower extremity swelling. Pt can tolerate 5mg  dose.    Past Medical History:  Diagnosis Date   Allergy    allergic rhinitis   Aortic root dilatation (HCC)    a. s/p surgical repair 08/27/2019   Asthma    mostly in childhood   Bicuspid aortic valve    a.  Status post bioprosthetic replacement 08/27/2019   BPH (benign prostatic hypertrophy)    CAD (coronary artery disease)    a. s/p 2-v CABG 08/2019 (SVG-OM, SVG--rPDA) *Grafts subsequently ligated due to bleeding   GERD (gastroesophageal reflux disease)    History of kidney stones    Hypertension    Hypertriglyceridemia    Hypothyroidism    Pericardial tamponade 09/25/2019   Severe aortic regurgitation  a. bicuspid aortic valve, b. s/p bio Bentall procedure 08/2019   Wears glasses     Past Surgical History:  Procedure Laterality Date   BENTALL PROCEDURE N/A 08/27/2019   Procedure: BENTALL PROCEDURE with a 27 mm KONECT Resilia Aortic Valved Conduit and 30 mm x 30 cm Hemashield Platinum straight graft.;  Surgeon: German Bartlett PEDLAR, MD;  Location: [facility] OR;  Service: Open Heart Surgery;  Laterality: N/A;   CARDIAC CATHETERIZATION  2006   [place]   CARDIOVASCULAR STRESS TEST  2006   [place]   CHOLECYSTECTOMY  2006   CORONARY ARTERY BYPASS GRAFT N/A 08/27/2019    Procedure: CORONARY ARTERY BYPASS GRAFTING (CABG) times 2 using right saphenous endoscopic vein harvesting to OM2 nad PDA.;  Surgeon: German Bartlett PEDLAR, MD;  Location: [facility] OR;  Service: Open Heart Surgery;  Laterality: N/A;   CYSTOSCOPY W/ URETERAL STENT PLACEMENT  05/24/2017   Procedure: left;  Surgeon: Chauncey Redell Agent, MD;  Location: [facility] ORS;  Service: Urology;;   CYSTOSCOPY W/ URETERAL STENT PLACEMENT Left 06/11/2017   Procedure: CYSTOSCOPY WITH STENT REPLACEMENT;  Surgeon: Penne Knee, MD;  Location: [facility] ORS;  Service: Urology;  Laterality: Left;   CYSTOSCOPY WITH BIOPSY Left 06/11/2017   Procedure: RENAL PELVIS TUMOR WITH BIOPSY WITH POSSIBLE LASER ABLATION;  Surgeon: Penne Knee, MD;  Location: [facility] ORS;  Service: Urology;  Laterality: Left;   CYSTOSCOPY WITH STENT PLACEMENT Left 05/24/2017   Procedure: , cystoscopy,left ureteral stent placement and left ureteroscopy attempted, retrograde pylogram;  Surgeon: Chauncey Redell Agent, MD;  Location: [facility] ORS;  Service: Urology;  Laterality: Left;   EYE SURGERY Bilateral    Cataract Extraction with IOL   FALSE ANEURYSM REPAIR  10/29/2019   REPAIR FALSE ANEURYSM RIGHT FEMORAL    FALSE ANEURYSM REPAIR Right 10/29/2019   Procedure: REPAIR FALSE ANEURYSM RIGHT FEMORAL;  Surgeon: Gretta Lonni PARAS, MD;  Location: [facility] OR;  Service: Vascular;  Laterality: Right;   HEMATOMA EVACUATION N/A 09/25/2019   Procedure: Evacuation Hematoma - Mediastinal Ligation of saphenous vein graft x two with Cardiopulmonary Bypass;  Surgeon: Dusty Sudie DEL, MD;  Location: [facility] OR;  Service: Thoracic;  Laterality: N/A;   IR THORACENTESIS ASP PLEURAL SPACE W/IMG GUIDE  09/02/2019   RIGHT/LEFT HEART CATH AND CORONARY ANGIOGRAPHY N/A 08/21/2019   Procedure: RIGHT/LEFT HEART CATH AND CORONARY ANGIOGRAPHY;  Surgeon: Perla Evalene PARAS, MD;  Location: [facility] INV[facility];  Service: Cardiovascular;  Laterality: N/A;   STERNOTOMY N/A 09/25/2019   Procedure: REDO  STERNOTOMY;  Surgeon: Dusty Sudie DEL, MD;  Location: [facility] OR;  Service: Thoracic;  Laterality: N/A;   TEE WITHOUT CARDIOVERSION N/A 08/27/2019   Procedure: TRANSESOPHAGEAL ECHOCARDIOGRAM (TEE);  Surgeon: German Bartlett PEDLAR, MD;  Location: [facility] OR;  Service: Open Heart Surgery;  Laterality: N/A;   TEE WITHOUT CARDIOVERSION N/A 09/25/2019   Procedure: Transesophageal Echocardiogram (Tee);  Surgeon: Dusty Sudie DEL, MD;  Location: [facility] OR;  Service: Thoracic;  Laterality: N/A;   URETEROSCOPY WITH HOLMIUM LASER LITHOTRIPSY Left 06/11/2017   Procedure: URETEROSCOPY WITH HOLMIUM LASER LITHOTRIPSY;  Surgeon: Penne Knee, MD;  Location: [facility] ORS;  Service: Urology;  Laterality: Left;   VEIN LIGATION AND STRIPPING Bilateral 1997    Family History  Problem Relation Age of Onset   Alcohol abuse Mother    Asthma Mother    Alcohol abuse Father    Prostate cancer Father    Liver cancer Paternal Grandmother    Colon cancer Neg Hx    Coronary artery disease Neg Hx  Hypertension Neg Hx    Diabetes Neg Hx    Bladder Cancer Neg Hx    Kidney cancer Neg Hx     Social History   Socioeconomic History   Marital status: Married    Spouse name: Dsean Vantol   Number of children: 2   Years of education: Not on file   Highest education level: Associate degree: academic program  Occupational History   Occupation:      Comment:     Occupation: Multimedia programmer estate    Comment: Retired   Occupation: Tax work---staff accountant    Comment: Full time now  Tobacco Use   Smoking status: Former    Current packs/day: 0.00    Types: Cigarettes    Quit date: 11/26/1978    Years since quitting: 45.5    Passive exposure: Past (as a child)   Smokeless tobacco: Never   Tobacco comments:    social smoked till 1980's  Vaping Use   Vaping status: Never Used  Substance and Sexual Activity   Alcohol use: No    Alcohol/week: 0.0 standard drinks of alcohol    Comment: none since 1984   Drug use: No   Sexual  activity: Not on file  Other Topics Concern   Not on file  Social History Narrative   Has living will   Wife is health care POA---son would be alternate   Would accept resuscitation   Would accept feeding tube at first---but not if prolonged    Social Drivers of Health   Financial Resource Strain: Low Risk  (12/14/2023)   Overall Financial Resource Strain (CARDIA)    Difficulty of Paying Living Expenses: Not hard at all  Food Insecurity: No Food Insecurity (12/14/2023)   Hunger Vital Sign    Worried About Running Out of Food in the Last Year: Never true    Ran Out of Food in the Last Year: Never true  Transportation Needs: No Transportation Needs (12/14/2023)   PRAPARE - Administrator, Civil Service (Medical): No    Lack of Transportation (Non-Medical): No  Physical Activity: Insufficiently Active (12/14/2023)   Exercise Vital Sign    Days of Exercise per Week: 1 day    Minutes of Exercise per Session: 40 min  Stress: No Stress Concern Present (12/14/2023)   [employer] of Occupational Health - Occupational Stress Questionnaire    Feeling of Stress : Not at all  Social Connections: Socially Integrated (12/14/2023)   Social Connection and Isolation Panel    Frequency of Communication with Friends and Family: More than three times a week    Frequency of Social Gatherings with Friends and Family: Twice a week    Attends Religious Services: More than 4 times per year    Active Member of [employer] or Organizations: Yes    Attends Engineer, structural: More than 4 times per year    Marital Status: Married  Catering manager Violence: Not on file   Review of Systems Appetite is fine Sleeps great Wears seat belt---keeps up with dentist No suspicious skin lesions--no recent checks No sig back or joint pains Bowels move fine--no blood Voids okay--slow at times but not a problem No heartburn since the aorta repair     Objective:   Physical Exam Constitutional:       Appearance: Normal appearance.  HENT:     Mouth/Throat:     Pharynx: No oropharyngeal exudate or posterior oropharyngeal erythema.  Eyes:     Conjunctiva/sclera:  Conjunctivae normal.     Pupils: Pupils are equal, round, and reactive to light.  Cardiovascular:     Rate and Rhythm: Normal rate and regular rhythm.     Pulses: Normal pulses.     Heart sounds:     No gallop.     Comments: Gr 2-3/6 aortic systolic murmur with single S2 Pulmonary:     Effort: Pulmonary effort is normal.     Breath sounds: Normal breath sounds. No wheezing or rales.  Abdominal:     Palpations: Abdomen is soft.     Tenderness: There is no abdominal tenderness.  Musculoskeletal:     Cervical back: Neck supple.     Right lower leg: No edema.     Left lower leg: No edema.  Lymphadenopathy:     Cervical: No cervical adenopathy.  Skin:    Findings: No lesion or rash.  Neurological:     General: No focal deficit present.     Mental Status: He is alert and oriented to person, place, and time.     Comments: Mini-cog---normal  Psychiatric:        Mood and Affect: Mood normal.        Behavior: Behavior normal.            Assessment & Plan:

## 2024-06-18 NOTE — Telephone Encounter (Signed)
 Spoke to pt, sch toc visit for 06/21/25

## 2024-06-29 ENCOUNTER — Other Ambulatory Visit: Payer: Self-pay | Admitting: Internal Medicine

## 2024-07-03 ENCOUNTER — Telehealth: Payer: Self-pay | Admitting: *Deleted

## 2024-07-03 NOTE — Telephone Encounter (Signed)
 Dr. Nandigam,  This pt is scheduled for PV on 07/10/24 and then with you for a colonoscopy on 08/21/24.  His chart is flagged for difficult intubation; after reviewing his EMR I have determined he is not a difficult airway.  I am clearing him for [facility] and ask that you remove the difficult airway dx.  Thanks much,  Norleen EMERSON Schillings

## 2024-07-06 NOTE — Telephone Encounter (Signed)
 Pt is scheduled  in room 50 for 8/15

## 2024-07-10 ENCOUNTER — Ambulatory Visit (AMBULATORY_SURGERY_CENTER): Admitting: *Deleted

## 2024-07-10 ENCOUNTER — Telehealth: Payer: Self-pay | Admitting: *Deleted

## 2024-07-10 VITALS — Ht 66.0 in | Wt 170.0 lb

## 2024-07-10 DIAGNOSIS — Z1211 Encounter for screening for malignant neoplasm of colon: Secondary | ICD-10-CM

## 2024-07-10 MED ORDER — NA SULFATE-K SULFATE-MG SULF 17.5-3.13-1.6 GM/177ML PO SOLN: 1.0000 | Freq: Once | ORAL | 0 refills | Status: AC

## 2024-07-10 NOTE — Progress Notes (Signed)
 Pre visit completed over telephone. Instructions sent through MyChart and email.    No egg or soy allergy known to patient  No issues known to pt with past sedation with any surgeries or procedures Patient denies ever being told they had issues or difficulty with intubation  No FH of Malignant Hyperthermia Pt is not on diet pills Pt is not on  home 02  Pt is not on blood thinners  Pt denies issues with constipation  No A fib or A flutter Have any cardiac testing pending-- NO Pt instructed to use Singlecare.com or GoodRx for a price reduction on prep     Chart reviewed by DOROTHA Schillings, CRNA AND CLEARED FOR ANESTHETIC CARE AT [facility]

## 2024-07-10 NOTE — Telephone Encounter (Signed)
 Hi Jeffrey Tucker , this paitent was flagged as difficult airway and will need to be done at the hospital.  Please advise.

## 2024-07-10 NOTE — Telephone Encounter (Signed)
 error

## 2024-07-10 NOTE — Telephone Encounter (Signed)
 DISREGARD!!!  hE WAS DEEMED AS ACCEPTABLE TO BE DONE AT  [facility]-- SORRY

## 2024-07-21 ENCOUNTER — Ambulatory Visit

## 2024-07-21 VITALS — BP 120/84 | Ht 65.75 in | Wt 166.8 lb

## 2024-07-21 DIAGNOSIS — Z0000 Encounter for general adult medical examination without abnormal findings: Secondary | ICD-10-CM

## 2024-07-21 NOTE — Patient Instructions (Signed)
 [name] , Thank you for taking time out of your busy schedule to complete your Annual Wellness Visit with me. I enjoyed our conversation and look forward to speaking with you again next year. I, as well as your care team,  appreciate your ongoing commitment to your health goals. Please review the following plan we discussed and let me know if I can assist you in the future. Your Game plan/ To Do List    Referrals: If you haven't heard from the office you've been referred to, please reach out to them at the phone provided.   Follow up Visits: We will see or speak with you next year for your Next Medicare AWV with our clinical staff Have you seen your provider in the last 6 months (3 months if uncontrolled diabetes)? Yes  Clinician Recommendations:  Aim for 30 minutes of exercise or brisk walking, 6-8 glasses of water, and 5 servings of fruits and vegetables each day.       This is a list of the screenings recommended for you:  Health Maintenance  Topic Date Due   Flu Shot  06/26/2024   Zoster (Shingles) Vaccine (1 of 2) 09/15/2024*   Pneumococcal Vaccine for age over 75 (1 of 2 - PCV) 06/15/2025*   Medicare Annual Wellness Visit  07/21/2025   Cologuard (Stool DNA test)  09/20/2026   DTaP/Tdap/Td vaccine (3 - Tdap) 12/27/2027   Hepatitis C Screening  Completed   HPV Vaccine  Aged Out   Meningitis B Vaccine  Aged Out   Colon Cancer Screening  Discontinued   COVID-19 Vaccine  Discontinued  *Topic was postponed. The date shown is not the original due date.    Advanced directives: (Copy Requested) Please bring a copy of your health care power of attorney and living will to the office to be added to your chart at your convenience. You can mail to [facility] [address] [place], [place] [address] or email to [email or web address] Advance Care Planning is important because it:  [x]  Makes sure you receive the medical care that is consistent with your values, goals,  and preferences  [x]  It provides guidance to your family and loved ones and reduces their decisional burden about whether or not they are making the right decisions based on your wishes.  Follow the link provided in your after visit summary or read over the paperwork we have mailed to you to help you started getting your Advance Directives in place. If you need assistance in completing these, please reach out to us  so that we can help you!  See attachments for Preventive Care and Fall Prevention Tips.

## 2024-07-21 NOTE — Progress Notes (Signed)
 Because this visit was a virtual/telehealth visit,  certain criteria was not obtained, such a blood pressure, CBG if applicable, and timed get up and go. Any medications not marked as taking were not mentioned during the medication reconciliation part of the visit. Any vitals not documented were not able to be obtained due to this being a telehealth visit or patient was unable to self-report a recent blood pressure reading due to a lack of equipment at home via telehealth. Vitals that have been documented are verbally provided by the patient.  This visit was performed by a medical professional under my direct supervision. I was immediately available for consultation/collaboration. I have reviewed and agree with the Annual Wellness Visit documentation.  Subjective:   Jeffrey Tucker is a 68 y.o. who presents for a Medicare Wellness preventive visit.  As a reminder, Annual Wellness Visits don't include a physical exam, and some assessments may be limited, especially if this visit is performed virtually. We may recommend an in-person follow-up visit with your provider if needed.  Visit Complete: Virtual I connected with  Jeffrey Tucker on 07/21/24 by a audio enabled telemedicine application and verified that I am speaking with the correct person using two identifiers.  Patient Location: Home  Provider Location: Home Office  I discussed the limitations of evaluation and management by telemedicine. The patient expressed understanding and agreed to proceed.  Vital Signs: Because this visit was a virtual/telehealth visit, some criteria may be missing or patient reported. Any vitals not documented were not able to be obtained and vitals that have been documented are patient reported.  VideoDeclined- This patient declined Librarian, academic. Therefore the visit was completed with audio only.  Persons Participating in Visit: Patient.  AWV Questionnaire: No: Patient Medicare  AWV questionnaire was not completed prior to this visit.  Cardiac Risk Factors include: advanced age (>5men, >77 women);male gender;hypertension     Objective:    Today's Vitals   07/21/24 0820  BP: 120/84  Weight: 166 lb 12.8 oz (75.7 kg)  Height: 5' 5.75 (1.67 m)   Body mass index is 27.13 kg/m.     07/21/2024    8:26 AM 12/17/2023    9:04 AM 02/15/2021    6:00 AM 02/14/2021    9:48 PM 10/29/2019    4:15 PM 10/29/2019    8:36 AM 10/13/2019    8:33 AM  Advanced Directives  Does Patient Have a Medical Advance Directive? Yes No Yes No Yes Yes Yes  Type of Estate agent of [place];Living will  Living will  Living will Healthcare Power of Jeffrey Tucker;Living will Healthcare Power of Jeffrey Tucker;Living will  Does patient want to make changes to medical advance directive? No - Patient declined  No - Patient declined  No - Patient declined  No - Patient declined  Copy of Healthcare Power of Attorney in Chart? No - copy requested        Would patient like information on creating a medical advance directive?  No - Patient declined No - Patient declined No - Patient declined No - Patient declined No - Patient declined     Current Medications (verified) Outpatient Encounter Medications as of 07/21/2024  Medication Sig   albuterol  (VENTOLIN  HFA) 108 (90 Base) MCG/ACT inhaler Inhale 2 puffs into the lungs 3 (three) times daily as needed for wheezing or shortness of breath.   aspirin  EC 81 MG EC tablet Take 1 tablet (81 mg total) by mouth daily.   atorvastatin  (  LIPITOR ) 40 MG tablet Take 1 tablet (40 mg total) by mouth daily.   fluticasone -salmeterol (ADVAIR) 100-50 MCG/ACT AEPB Inhale 1 puff into the lungs 2 (two) times daily.   ketoconazole  (NIZORAL ) 2 % shampoo Massage into scalp 1-2 times per week, let sit several minutes before rinsing.   levocetirizine (XYZAL) 5 MG tablet Take 5 mg by mouth every evening.   levothyroxine  (SYNTHROID ) 125 MCG tablet TAKE 1 TABLET BY MOUTH  DAILY   mometasone  (ELOCON ) 0.1 % lotion APPLY TOPICALLY 2 TIMES A WEEK   montelukast  (SINGULAIR ) 10 MG tablet Take 10 mg by mouth at bedtime.   nitroGLYCERIN  (NITRODUR - DOSED IN MG/24 HR) 0.2 mg/hr patch Place 1 patch onto the skin.   UNABLE TO FIND Med Name: Allergy Shots weekly   valACYclovir  (VALTREX ) 1000 MG tablet TAKE 2 TABLETS FOR COLD SORE THEN REPEAT 2 TABLETS IN 12 HOURS   No facility-administered encounter medications on file as of 07/21/2024.    Allergies (verified) Tetracycline hcl, Beta adrenergic blockers, Metoprolol  tartrate, and Amlodipine    History: Past Medical History:  Diagnosis Date   Allergy    allergic rhinitis   Aortic root dilatation (HCC)    a. s/p surgical repair 08/27/2019   Asthma    mostly in childhood   Bicuspid aortic valve    a.  Status post bioprosthetic replacement 08/27/2019   BPH (benign prostatic hypertrophy)    CAD (coronary artery disease)    a. s/p 2-v CABG 08/2019 (SVG-OM, SVG--rPDA) *Grafts subsequently ligated due to bleeding   GERD (gastroesophageal reflux disease)    History of kidney stones    Hypertension    Hypertriglyceridemia    Hypothyroidism    Pericardial tamponade 09/25/2019   Severe aortic regurgitation    a. bicuspid aortic valve, b. s/p bio Bentall procedure 08/2019   Wears glasses    Past Surgical History:  Procedure Laterality Date   BENTALL PROCEDURE N/A 08/27/2019   Procedure: BENTALL PROCEDURE with a 27 mm KONECT Resilia Aortic Valved Conduit and 30 mm x 30 cm Hemashield Platinum straight graft.;  Surgeon: German Bartlett PEDLAR, MD;  Location: [facility] OR;  Service: Open Heart Surgery;  Laterality: N/A;   CARDIAC CATHETERIZATION  2006   [place]   CARDIOVASCULAR STRESS TEST  2006   [place]   CHOLECYSTECTOMY  2006   CORONARY ARTERY BYPASS GRAFT N/A 08/27/2019   Procedure: CORONARY ARTERY BYPASS GRAFTING (CABG) times 2 using right saphenous endoscopic vein harvesting to OM2 nad PDA.;  Surgeon: German Bartlett PEDLAR, MD;   Location: [facility] OR;  Service: Open Heart Surgery;  Laterality: N/A;   CYSTOSCOPY W/ URETERAL STENT PLACEMENT  05/24/2017   Procedure: left;  Surgeon: Chauncey Redell Agent, MD;  Location: [facility] ORS;  Service: Urology;;   CYSTOSCOPY W/ URETERAL STENT PLACEMENT Left 06/11/2017   Procedure: CYSTOSCOPY WITH STENT REPLACEMENT;  Surgeon: Penne Knee, MD;  Location: [facility] ORS;  Service: Urology;  Laterality: Left;   CYSTOSCOPY WITH BIOPSY Left 06/11/2017   Procedure: RENAL PELVIS TUMOR WITH BIOPSY WITH POSSIBLE LASER ABLATION;  Surgeon: Penne Knee, MD;  Location: [facility] ORS;  Service: Urology;  Laterality: Left;   CYSTOSCOPY WITH STENT PLACEMENT Left 05/24/2017   Procedure: , cystoscopy,left ureteral stent placement and left ureteroscopy attempted, retrograde pylogram;  Surgeon: Chauncey Redell Agent, MD;  Location: [facility] ORS;  Service: Urology;  Laterality: Left;   EYE SURGERY Bilateral    Cataract Extraction with IOL   FALSE ANEURYSM REPAIR  10/29/2019   REPAIR FALSE ANEURYSM RIGHT FEMORAL  FALSE ANEURYSM REPAIR Right 10/29/2019   Procedure: REPAIR FALSE ANEURYSM RIGHT FEMORAL;  Surgeon: Gretta Lonni PARAS, MD;  Location: [facility] OR;  Service: Vascular;  Laterality: Right;   HEMATOMA EVACUATION N/A 09/25/2019   Procedure: Evacuation Hematoma - Mediastinal Ligation of saphenous vein graft x two with Cardiopulmonary Bypass;  Surgeon: Dusty Sudie DEL, MD;  Location: [facility] OR;  Service: Thoracic;  Laterality: N/A;   IR THORACENTESIS ASP PLEURAL SPACE W/IMG GUIDE  09/02/2019   RIGHT/LEFT HEART CATH AND CORONARY ANGIOGRAPHY N/A 08/21/2019   Procedure: RIGHT/LEFT HEART CATH AND CORONARY ANGIOGRAPHY;  Surgeon: Perla Evalene PARAS, MD;  Location: [facility] INVASIVE CV LAB;  Service: Cardiovascular;  Laterality: N/A;   STERNOTOMY N/A 09/25/2019   Procedure: REDO STERNOTOMY;  Surgeon: Dusty Sudie DEL, MD;  Location: [facility] OR;  Service: Thoracic;  Laterality: N/A;   TEE WITHOUT CARDIOVERSION N/A 08/27/2019   Procedure:  TRANSESOPHAGEAL ECHOCARDIOGRAM (TEE);  Surgeon: German Bartlett PEDLAR, MD;  Location: [facility] OR;  Service: Open Heart Surgery;  Laterality: N/A;   TEE WITHOUT CARDIOVERSION N/A 09/25/2019   Procedure: Transesophageal Echocardiogram (Tee);  Surgeon: Dusty Sudie DEL, MD;  Location: [facility] OR;  Service: Thoracic;  Laterality: N/A;   URETEROSCOPY WITH HOLMIUM LASER LITHOTRIPSY Left 06/11/2017   Procedure: URETEROSCOPY WITH HOLMIUM LASER LITHOTRIPSY;  Surgeon: Penne Knee, MD;  Location: [facility] ORS;  Service: Urology;  Laterality: Left;   VEIN LIGATION AND STRIPPING Bilateral 1997   Family History  Problem Relation Age of Onset   Alcohol abuse Mother    Asthma Mother    Alcohol abuse Father    Prostate cancer Father    Liver cancer Paternal Grandmother    Colon cancer Neg Hx    Coronary artery disease Neg Hx    Hypertension Neg Hx    Diabetes Neg Hx    Bladder Cancer Neg Hx    Kidney cancer Neg Hx    Esophageal cancer Neg Hx    Stomach cancer Neg Hx    Rectal cancer Neg Hx    Social History   Socioeconomic History   Marital status: Married    Spouse name: Brantly Kalman   Number of children: 2   Years of education: Not on file   Highest education level: Associate degree: academic program  Occupational History   Occupation:      Comment:     Occupation: Multimedia programmer estate    Comment: Retired   Occupation: Tax work---staff accountant    Comment: Full time now  Tobacco Use   Smoking status: Former    Current packs/day: 0.00    Types: Cigarettes    Quit date: 11/26/1978    Years since quitting: 45.6    Passive exposure: Past (as a child)   Smokeless tobacco: Never   Tobacco comments:    social smoked till 1980's  Vaping Use   Vaping status: Never Used  Substance and Sexual Activity   Alcohol use: No    Alcohol/week: 0.0 standard drinks of alcohol    Comment: none since 1984   Drug use: No   Sexual activity: Not on file  Other Topics Concern   Not on file  Social History  Narrative   Has living will   Wife is health care POA---son would be alternate   Would accept resuscitation   Would accept feeding tube at first---but not if prolonged    Social Drivers of Health   Financial Resource Strain: Low Risk  (07/21/2024)   Overall Financial Resource Strain (CARDIA)  Difficulty of Paying Living Expenses: Not hard at all  Food Insecurity: No Food Insecurity (07/21/2024)   Hunger Vital Sign    Worried About Running Out of Food in the Last Year: Never true    Ran Out of Food in the Last Year: Never true  Transportation Needs: No Transportation Needs (07/21/2024)   PRAPARE - Administrator, Civil Service (Medical): No    Lack of Transportation (Non-Medical): No  Physical Activity: Sufficiently Active (07/21/2024)   Exercise Vital Sign    Days of Exercise per Week: 7 days    Minutes of Exercise per Session: 30 min  Stress: No Stress Concern Present (07/21/2024)   [employer] of Occupational Health - Occupational Stress Questionnaire    Feeling of Stress: Not at all  Social Connections: Socially Integrated (07/21/2024)   Social Connection and Isolation Panel    Frequency of Communication with Friends and Family: More than three times a week    Frequency of Social Gatherings with Friends and Family: Twice a week    Attends Religious Services: More than 4 times per year    Active Member of [employer] or Organizations: Yes    Attends Engineer, structural: More than 4 times per year    Marital Status: Married    Tobacco Counseling Counseling given: Not Answered Tobacco comments: social smoked till 1980's    Clinical Intake:  Pre-visit preparation completed: Yes  Pain : No/denies pain     BMI - recorded: 27.13 Nutritional Status: BMI 25 -29 Overweight Nutritional Risks: None Diabetes: No  Lab Results  Component Value Date   HGBA1C 5.7 (H) 02/15/2021   HGBA1C 5.5 08/25/2019     How often do you need to have someone help you  when you read instructions, pamphlets, or other written materials from your doctor or pharmacy?: 1 - Never  Interpreter Needed?: No  Information entered by :: Dolly Harbach<CMA   Activities of Daily Living     07/21/2024    8:25 AM  In your present state of health, do you have any difficulty performing the following activities:  Hearing? 0  Vision? 0  Difficulty concentrating or making decisions? 0  Walking or climbing stairs? 0  Dressing or bathing? 0  Doing errands, shopping? 0  Preparing Food and eating ? N  Using the Toilet? N  In the past six months, have you accidently leaked urine? N  Do you have problems with loss of bowel control? N  Managing your Medications? N  Managing your Finances? N  Housekeeping or managing your Housekeeping? N    Patient Care Team: Jimmy Charlie FERNS, MD as PCP - General (Internal Medicine) Perla Evalene PARAS, MD as PCP - Cardiology (Cardiology) Jimmy Charlie FERNS, MD Perla Evalene PARAS, MD (Cardiology)  I have updated your Care Teams any recent Medical Services you may have received from other providers in the past year.     Assessment:   This is a routine wellness examination for Rydan.  Hearing/Vision screen Hearing Screening - Comments:: No hearing aids  Vision Screening - Comments:: Patient wears glasses    Goals Addressed               This Visit's Progress     Patient Stated (pt-stated)        To lose 10lbs       Depression Screen     07/21/2024    8:28 AM 06/15/2024    8:48 AM 06/07/2023   10:48 AM  06/07/2023   10:27 AM 05/28/2022    3:54 PM 05/22/2021    2:55 PM 03/21/2021    4:16 PM  PHQ 2/9 Scores  PHQ - 2 Score 0 0 0 0 0 0 0  PHQ- 9 Score 0          Fall Risk     07/21/2024    8:26 AM 06/15/2024    8:48 AM 06/07/2023   10:48 AM 06/07/2023   10:27 AM 05/28/2022    3:54 PM  Fall Risk   Falls in the past year? 0 0 0 0 0  Number falls in past yr: 0   0   Injury with Fall? 0   0   Risk for fall due to : No  Fall Risks   No Fall Risks   Follow up Falls evaluation completed   Falls evaluation completed     MEDICARE RISK AT HOME:  Medicare Risk at Home Any stairs in or around the home?: Yes If so, are there any without handrails?: No Home free of loose throw rugs in walkways, pet beds, electrical cords, etc?: Yes Adequate lighting in your home to reduce risk of falls?: Yes Life alert?: No Use of a cane, walker or w/c?: No Grab bars in the bathroom?: Yes Shower chair or bench in shower?: Yes Elevated toilet seat or a handicapped toilet?: Yes  TIMED UP AND GO:  Was the test performed?  No  Cognitive Function: 6CIT completed        07/21/2024    8:24 AM  6CIT Screen  What Year? 0 points  What month? 0 points  What time? 0 points  Count back from 20 0 points  Months in reverse 0 points  Repeat phrase 0 points  Total Score 0 points    Immunizations Immunization History  Administered Date(s) Administered   Influenza,inj,Quad PF,6+ Mos 09/09/2013, 09/10/2014, 12/21/2016, 09/09/2017, 09/05/2018   Td 04/14/2008, 12/26/2017    Screening Tests Health Maintenance  Topic Date Due   INFLUENZA VACCINE  06/26/2024   Zoster Vaccines- Shingrix (1 of 2) 09/15/2024 (Originally 12/20/1974)   Pneumococcal Vaccine: 50+ Years (1 of 2 - PCV) 06/15/2025 (Originally [birth year]20/1974)   Medicare Annual Wellness (AWV)  07/21/2025   Fecal DNA (Cologuard)  09/20/2026   DTaP/Tdap/Td (3 - Tdap) 12/27/2027   Hepatitis C Screening  Completed   HPV VACCINES  Aged Out   Meningococcal B Vaccine  Aged Out   Colonoscopy  Discontinued   COVID-19 Vaccine  Discontinued    Health Maintenance  Health Maintenance Due  Topic Date Due   INFLUENZA VACCINE  06/26/2024   Health Maintenance Items Addressed:patient declined   Additional Screening:  Vision Screening: Recommended annual ophthalmology exams for early detection of glaucoma and other disorders of the eye. Would you like a referral to an eye doctor? No     Dental Screening: Recommended annual dental exams for proper oral hygiene  Community Resource Referral / Chronic Care Management: CRR required this visit?  No   CCM required this visit?  No   Plan:    I have personally reviewed and noted the following in the patient's chart:   Medical and social history Use of alcohol, tobacco or illicit drugs  Current medications and supplements including opioid prescriptions. Patient is not currently taking opioid prescriptions. Functional ability and status Nutritional status Physical activity Advanced directives List of other physicians Hospitalizations, surgeries, and ER visits in previous 12 months Vitals Screenings to include cognitive, depression, and falls  Referrals and appointments  In addition, I have reviewed and discussed with patient certain preventive protocols, quality metrics, and best practice recommendations. A written personalized care plan for preventive services as well as general preventive health recommendations were provided to patient.   Lyle MARLA Right, [place]   07/21/2024   After Visit Summary: (MyChart) Due to this being a telephonic visit, the after visit summary with patients personalized plan was offered to patient via MyChart   Notes: Nothing significant to report at this time.

## 2024-07-24 ENCOUNTER — Other Ambulatory Visit: Payer: Self-pay | Admitting: Internal Medicine

## 2024-07-24 ENCOUNTER — Other Ambulatory Visit: Payer: Self-pay | Admitting: Cardiovascular Disease

## 2024-07-24 DIAGNOSIS — I25118 Atherosclerotic heart disease of native coronary artery with other forms of angina pectoris: Secondary | ICD-10-CM

## 2024-07-24 DIAGNOSIS — I7121 Aneurysm of the ascending aorta, without rupture: Secondary | ICD-10-CM

## 2024-07-24 DIAGNOSIS — Z953 Presence of xenogenic heart valve: Secondary | ICD-10-CM

## 2024-07-24 DIAGNOSIS — I480 Paroxysmal atrial fibrillation: Secondary | ICD-10-CM

## 2024-07-24 DIAGNOSIS — I724 Aneurysm of artery of lower extremity: Secondary | ICD-10-CM

## 2024-07-24 DIAGNOSIS — I351 Nonrheumatic aortic (valve) insufficiency: Secondary | ICD-10-CM

## 2024-08-21 ENCOUNTER — Encounter: Payer: Self-pay | Admitting: Gastroenterology

## 2024-08-21 ENCOUNTER — Ambulatory Visit: Admitting: Gastroenterology

## 2024-08-21 VITALS — BP 145/86 | HR 48 | Temp 97.7°F | Resp 11 | Ht 66.0 in | Wt 170.0 lb

## 2024-08-21 DIAGNOSIS — K6289 Other specified diseases of anus and rectum: Secondary | ICD-10-CM

## 2024-08-21 DIAGNOSIS — K648 Other hemorrhoids: Secondary | ICD-10-CM | POA: Diagnosis not present

## 2024-08-21 DIAGNOSIS — K644 Residual hemorrhoidal skin tags: Secondary | ICD-10-CM

## 2024-08-21 DIAGNOSIS — Z1211 Encounter for screening for malignant neoplasm of colon: Secondary | ICD-10-CM | POA: Diagnosis not present

## 2024-08-21 MED ORDER — SODIUM CHLORIDE 0.9 % IV SOLN: 500.0000 mL | INTRAVENOUS | Status: DC

## 2024-08-21 NOTE — Progress Notes (Signed)
 Report to PACU, RN, vss, BBS= Clear.

## 2024-08-21 NOTE — Progress Notes (Signed)
Pt states no changes to health hx since previsit

## 2024-08-21 NOTE — Op Note (Signed)
 [facility] Patient Name: Jeffrey Tucker Procedure Date: 08/21/2024 8:52 AM MRN: [id number] Endoscopist: Gustav ALONSO Mcgee , MD, [id number] Age: 68 Referring MD:  Date of Birth: Oct 10, 1956 Gender: Male Account #: [id number] Procedure:                Colonoscopy Indications:              Screening for colorectal malignant neoplasm Medicines:                Monitored Anesthesia Care Procedure:                Pre-Anesthesia Assessment:                           - Prior to the procedure, a History and Physical                            was performed, and patient medications and                            allergies were reviewed. The patient's tolerance of                            previous anesthesia was also reviewed. The risks                            and benefits of the procedure and the sedation                            options and risks were discussed with the patient.                            All questions were answered, and informed consent                            was obtained. Prior Anticoagulants: The patient has                            taken no anticoagulant or antiplatelet agents. ASA                            Grade Assessment: II - A patient with mild systemic                            disease. After reviewing the risks and benefits,                            the patient was deemed in satisfactory condition to                            undergo the procedure.                           After obtaining informed consent, the colonoscope  was passed under direct vision. Throughout the                            procedure, the patient's blood pressure, pulse, and                            oxygen  saturations were monitored continuously. The                            Olympus Scope SN [id number] was introduced through the                            anus and advanced to the the cecum, identified by                             appendiceal orifice and ileocecal valve. The                            colonoscopy was performed without difficulty. The                            patient tolerated the procedure well. The quality                            of the bowel preparation was good. The ileocecal                            valve, appendiceal orifice, and rectum were                            photographed. Scope In: 9:03:47 AM Scope Out: 9:21:18 AM Scope Withdrawal Time: 0 hours 10 minutes 11 seconds  Total Procedure Duration: 0 hours 17 minutes 31 seconds  Findings:                 The perianal and digital rectal examinations were                            normal.                           Non-bleeding external and internal hemorrhoids were                            found during retroflexion. The hemorrhoids were                            medium-sized.                           Anal papilla(e) were hypertrophied. Complications:            No immediate complications. Estimated Blood Loss:     Estimated blood loss was minimal. Impression:               - Non-bleeding external and internal hemorrhoids.                           -  Anal papilla(e) were hypertrophied.                           - No specimens collected. Recommendation:           - Resume previous diet.                           - Continue present medications.                           - Repeat colonoscopy in 10 years for surveillance. Giara Mcgaughey V. Elisabeth Strom, MD 08/21/2024 9:29:28 AM This report has been signed electronically.

## 2024-08-21 NOTE — Progress Notes (Signed)
 [facility] History and Physical   Primary Care Physician:  Jimmy Charlie FERNS, MD   Reason for Procedure:  Colorectal cancer screening  Plan:    Screening colonoscopy with possible interventions as needed     HPI: Jeffrey Tucker is a very pleasant 68 y.o. male here for screening colonoscopy. Denies any nausea, vomiting, abdominal pain, melena or bright red blood per rectum  The risks and benefits as well as alternatives of endoscopic procedure(s) have been discussed and reviewed. All questions answered. The patient agrees to proceed.    Past Medical History:  Diagnosis Date   Allergy    allergic rhinitis   Aortic root dilatation    a. s/p surgical repair 08/27/2019   Asthma    mostly in childhood   Bicuspid aortic valve    a.  Status post bioprosthetic replacement 08/27/2019   BPH (benign prostatic hypertrophy)    CAD (coronary artery disease)    a. s/p 2-v CABG 08/2019 (SVG-OM, SVG--rPDA) *Grafts subsequently ligated due to bleeding   GERD (gastroesophageal reflux disease)    History of kidney stones    Hypertension    Hypertriglyceridemia    Hypothyroidism    Pericardial tamponade 09/25/2019   Severe aortic regurgitation    a. bicuspid aortic valve, b. s/p bio Bentall procedure 08/2019   Wears glasses     Past Surgical History:  Procedure Laterality Date   BENTALL PROCEDURE N/A 08/27/2019   Procedure: BENTALL PROCEDURE with a 27 mm KONECT Resilia Aortic Valved Conduit and 30 mm x 30 cm Hemashield Platinum straight graft.;  Surgeon: German Bartlett PEDLAR, MD;  Location: [facility] OR;  Service: Open Heart Surgery;  Laterality: N/A;   CARDIAC CATHETERIZATION  2006   [place]   CARDIOVASCULAR STRESS TEST  2006   [place]   CHOLECYSTECTOMY  2006   CORONARY ARTERY BYPASS GRAFT N/A 08/27/2019   Procedure: CORONARY ARTERY BYPASS GRAFTING (CABG) times 2 using right saphenous endoscopic vein harvesting to OM2 nad PDA.;  Surgeon: German Bartlett PEDLAR, MD;  Location: [facility] OR;   Service: Open Heart Surgery;  Laterality: N/A;   CYSTOSCOPY W/ URETERAL STENT PLACEMENT  05/24/2017   Procedure: left;  Surgeon: Chauncey Redell Agent, MD;  Location: [facility] ORS;  Service: Urology;;   CYSTOSCOPY W/ URETERAL STENT PLACEMENT Left 06/11/2017   Procedure: CYSTOSCOPY WITH STENT REPLACEMENT;  Surgeon: Penne Knee, MD;  Location: [facility] ORS;  Service: Urology;  Laterality: Left;   CYSTOSCOPY WITH BIOPSY Left 06/11/2017   Procedure: RENAL PELVIS TUMOR WITH BIOPSY WITH POSSIBLE LASER ABLATION;  Surgeon: Penne Knee, MD;  Location: [facility] ORS;  Service: Urology;  Laterality: Left;   CYSTOSCOPY WITH STENT PLACEMENT Left 05/24/2017   Procedure: , cystoscopy,left ureteral stent placement and left ureteroscopy attempted, retrograde pylogram;  Surgeon: Chauncey Redell Agent, MD;  Location: [facility] ORS;  Service: Urology;  Laterality: Left;   EYE SURGERY Bilateral    Cataract Extraction with IOL   FALSE ANEURYSM REPAIR  10/29/2019   REPAIR FALSE ANEURYSM RIGHT FEMORAL    FALSE ANEURYSM REPAIR Right 10/29/2019   Procedure: REPAIR FALSE ANEURYSM RIGHT FEMORAL;  Surgeon: Gretta Lonni PARAS, MD;  Location: [facility] OR;  Service: Vascular;  Laterality: Right;   HEMATOMA EVACUATION N/A 09/25/2019   Procedure: Evacuation Hematoma - Mediastinal Ligation of saphenous vein graft x two with Cardiopulmonary Bypass;  Surgeon: Dusty Sudie DEL, MD;  Location: [facility] OR;  Service: Thoracic;  Laterality: N/A;   IR THORACENTESIS ASP PLEURAL SPACE W/IMG GUIDE  09/02/2019   RIGHT/LEFT HEART  CATH AND CORONARY ANGIOGRAPHY N/A 08/21/2019   Procedure: RIGHT/LEFT HEART CATH AND CORONARY ANGIOGRAPHY;  Surgeon: Perla Evalene PARAS, MD;  Location: [facility];  Service: Cardiovascular;  Laterality: N/A;   STERNOTOMY N/A 09/25/2019   Procedure: REDO STERNOTOMY;  Surgeon: Dusty Sudie DEL, MD;  Location: [facility] OR;  Service: Thoracic;  Laterality: N/A;   TEE WITHOUT CARDIOVERSION N/A 08/27/2019   Procedure: TRANSESOPHAGEAL  ECHOCARDIOGRAM (TEE);  Surgeon: German Bartlett PEDLAR, MD;  Location: [facility] OR;  Service: Open Heart Surgery;  Laterality: N/A;   TEE WITHOUT CARDIOVERSION N/A 09/25/2019   Procedure: Transesophageal Echocardiogram (Tee);  Surgeon: Dusty Sudie DEL, MD;  Location: [facility] OR;  Service: Thoracic;  Laterality: N/A;   URETEROSCOPY WITH HOLMIUM LASER LITHOTRIPSY Left 06/11/2017   Procedure: URETEROSCOPY WITH HOLMIUM LASER LITHOTRIPSY;  Surgeon: Penne Knee, MD;  Location: [facility] ORS;  Service: Urology;  Laterality: Left;   VEIN LIGATION AND STRIPPING Bilateral 1997    Prior to Admission medications   Medication Sig Start Date End Date Taking? Authorizing Provider  aspirin  EC 81 MG EC tablet Take 1 tablet (81 mg total) by mouth daily. 09/25/19  Yes Swayze, Ava, DO  atorvastatin  (LIPITOR ) 40 MG tablet TAKE 1 TABLET BY MOUTH DAILY 07/24/24  Yes Gollan, Timothy J, MD  levothyroxine  (SYNTHROID ) 125 MCG tablet TAKE 1 TABLET BY MOUTH DAILY 06/29/24  Yes Letvak, Richard I, MD  montelukast  (SINGULAIR ) 10 MG tablet Take 10 mg by mouth at bedtime.   Yes [provider]  albuterol  (VENTOLIN  HFA) 108 (90 Base) MCG/ACT inhaler Inhale 2 puffs into the lungs 3 (three) times daily as needed for wheezing or shortness of breath. 03/03/24   Letvak, Richard I, MD  fluticasone -salmeterol (ADVAIR) 100-50 MCG/ACT AEPB Inhale 1 puff into the lungs 2 (two) times daily. 03/19/24   Jimmy Charlie FERNS, MD  ketoconazole  (NIZORAL ) 2 % shampoo Massage into scalp 1-2 times per week, let sit several minutes before rinsing. 03/13/23   Stewart, Tara, MD  levocetirizine (XYZAL) 5 MG tablet Take 5 mg by mouth every evening.    [provider]  mometasone  (ELOCON ) 0.1 % lotion APPLY TOPICALLY 2 TIMES A WEEK 09/16/23   Jackquline Sawyer, MD  nitroGLYCERIN  (NITRODUR - DOSED IN MG/24 HR) 0.2 mg/hr patch Place 1 patch onto the skin. 10/09/23   [provider]  UNABLE TO FIND Med Name: Allergy Shots weekly    [provider]   valACYclovir  (VALTREX ) 1000 MG tablet TAKE 2 TABLETS BY MOUTH FOR COLD SORE, THEN REPEAT 2 TABLETS BY MOUTH IN 12 HOURS 07/24/24   Webb, Padonda B, FNP    Current Outpatient Medications  Medication Sig Dispense Refill   aspirin  EC 81 MG EC tablet Take 1 tablet (81 mg total) by mouth daily. 30 tablet 0   atorvastatin  (LIPITOR ) 40 MG tablet TAKE 1 TABLET BY MOUTH DAILY 30 tablet 0   levothyroxine  (SYNTHROID ) 125 MCG tablet TAKE 1 TABLET BY MOUTH DAILY 90 tablet 3   montelukast  (SINGULAIR ) 10 MG tablet Take 10 mg by mouth at bedtime.     albuterol  (VENTOLIN  HFA) 108 (90 Base) MCG/ACT inhaler Inhale 2 puffs into the lungs 3 (three) times daily as needed for wheezing or shortness of breath. 18 g 1   fluticasone -salmeterol (ADVAIR) 100-50 MCG/ACT AEPB Inhale 1 puff into the lungs 2 (two) times daily. 60 each 1   ketoconazole  (NIZORAL ) 2 % shampoo Massage into scalp 1-2 times per week, let sit several minutes before rinsing. 120 mL 11  levocetirizine (XYZAL) 5 MG tablet Take 5 mg by mouth every evening.     mometasone  (ELOCON ) 0.1 % lotion APPLY TOPICALLY 2 TIMES A WEEK 60 mL 6   nitroGLYCERIN  (NITRODUR - DOSED IN MG/24 HR) 0.2 mg/hr patch Place 1 patch onto the skin.     UNABLE TO FIND Med Name: Allergy Shots weekly     valACYclovir  (VALTREX ) 1000 MG tablet TAKE 2 TABLETS BY MOUTH FOR COLD SORE, THEN REPEAT 2 TABLETS BY MOUTH IN 12 HOURS 20 tablet 0   Current Facility-Administered Medications  Medication Dose Route Frequency Provider Last Rate Last Admin   0.9 %  sodium chloride  infusion  500 mL Intravenous Continuous Emine Lopata V, MD        Allergies as of 08/21/2024 - Review Complete 08/21/2024  Allergen Reaction Noted   Tetracycline hcl Other (See Comments) 04/27/2009   Beta adrenergic blockers  10/02/2019   Metoprolol  tartrate  09/27/2019   Amlodipine  Other (See Comments) 09/25/2019    Family History  Problem Relation Age of Onset   Alcohol abuse Mother    Asthma Mother     Alcohol abuse Father    Prostate cancer Father    Liver cancer Paternal Grandmother    Colon cancer Neg Hx    Coronary artery disease Neg Hx    Hypertension Neg Hx    Diabetes Neg Hx    Bladder Cancer Neg Hx    Kidney cancer Neg Hx    Esophageal cancer Neg Hx    Stomach cancer Neg Hx    Rectal cancer Neg Hx     Social History   Socioeconomic History   Marital status: Married    Spouse name: Hermenegildo Clausen   Number of children: 2   Years of education: Not on file   Highest education level: Associate degree: academic program  Occupational History   Occupation:      Comment:     Occupation: Multimedia programmer estate    Comment: Retired   Occupation: Tax work---staff accountant    Comment: Full time now  Tobacco Use   Smoking status: Former    Current packs/day: 0.00    Types: Cigarettes    Quit date: 11/26/1978    Years since quitting: 45.7    Passive exposure: Past (as a child)   Smokeless tobacco: Never   Tobacco comments:    social smoked till 1980's  Vaping Use   Vaping status: Never Used  Substance and Sexual Activity   Alcohol use: No    Alcohol/week: 0.0 standard drinks of alcohol    Comment: none since 1984   Drug use: No   Sexual activity: Not on file  Other Topics Concern   Not on file  Social History Narrative   Has living will   Wife is health care POA---son would be alternate   Would accept resuscitation   Would accept feeding tube at first---but not if prolonged    Social Drivers of Corporate investment banker Strain: Low Risk  (07/21/2024)   Overall Financial Resource Strain (CARDIA)    Difficulty of Paying Living Expenses: Not hard at all  Food Insecurity: No Food Insecurity (07/21/2024)   Hunger Vital Sign    Worried About Running Out of Food in the Last Year: Never true    Ran Out of Food in the Last Year: Never true  Transportation Needs: No Transportation Needs (07/21/2024)   PRAPARE - Administrator, Civil Service (Medical): No  Lack of Transportation (Non-Medical): No  Physical Activity: Sufficiently Active (07/21/2024)   Exercise Vital Sign    Days of Exercise per Week: 7 days    Minutes of Exercise per Session: 30 min  Stress: No Stress Concern Present (07/21/2024)   [employer] of Occupational Health - Occupational Stress Questionnaire    Feeling of Stress: Not at all  Social Connections: Socially Integrated (07/21/2024)   Social Connection and Isolation Panel    Frequency of Communication with Friends and Family: More than three times a week    Frequency of Social Gatherings with Friends and Family: Twice a week    Attends Religious Services: More than 4 times per year    Active Member of [employer] or Organizations: Yes    Attends Engineer, structural: More than 4 times per year    Marital Status: Married  Catering manager Violence: Not At Risk (07/21/2024)   Humiliation, Afraid, Rape, and Kick questionnaire    Fear of Current or Ex-Partner: No    Emotionally Abused: No    Physically Abused: No    Sexually Abused: No    Review of Systems:  All other review of systems negative except as mentioned in the HPI.  Physical Exam: Vital signs in last 24 hours: BP (!) 143/89   Pulse (!) 54   Temp 97.7 F (36.5 C)   Ht 5' 6 (1.676 m)   Wt 170 lb (77.1 kg)   SpO2 94%   BMI 27.44 kg/m  General:   Alert, NAD Lungs:  Clear .   Heart:  Regular rate and rhythm Abdomen:  Soft, nontender and nondistended. Neuro/Psych:  Alert and cooperative. Normal mood and affect. A and O x 3  Reviewed labs, radiology imaging, old records and pertinent past GI work up  Patient is appropriate for planned procedure(s) and anesthesia in an ambulatory setting   K. Veena Rj Pedrosa , MD [phone]

## 2024-08-21 NOTE — Patient Instructions (Signed)
Thank you for letting us take care of your healthcare needs today. Please see handouts given to you on Hemorrhoids.    YOU HAD AN ENDOSCOPIC PROCEDURE TODAY AT THE [facility]:   Refer to the procedure report that was given to you for any specific questions about what was found during the examination.  If the procedure report does not answer your questions, please call your gastroenterologist to clarify.  If you requested that your care partner not be given the details of your procedure findings, then the procedure report has been included in a sealed envelope for you to review at your convenience later.  YOU SHOULD EXPECT: Some feelings of bloating in the abdomen. Passage of more gas than usual.  Walking can help get rid of the air that was put into your GI tract during the procedure and reduce the bloating. If you had a lower endoscopy (such as a colonoscopy or flexible sigmoidoscopy) you may notice spotting of blood in your stool or on the toilet paper. If you underwent a bowel prep for your procedure, you may not have a normal bowel movement for a few days.  Please Note:  You might notice some irritation and congestion in your nose or some drainage.  This is from the oxygen used during your procedure.  There is no need for concern and it should clear up in a day or so.  SYMPTOMS TO REPORT IMMEDIATELY:  Following lower endoscopy (colonoscopy or flexible sigmoidoscopy):  Excessive amounts of blood in the stool  Significant tenderness or worsening of abdominal pains  Swelling of the abdomen that is new, acute  Fever of 100F or higher   For urgent or emergent issues, a gastroenterologist can be reached at any hour by calling [phone]. Do not use MyChart messaging for urgent concerns.    DIET:  We do recommend a small meal at first, but then you may proceed to your regular diet.  Drink plenty of fluids but you should avoid alcoholic beverages for 24 hours.  ACTIVITY:  You  should plan to take it easy for the rest of today and you should NOT DRIVE or use heavy machinery until tomorrow (because of the sedation medicines used during the test).    FOLLOW UP: Our staff will call the number listed on your records the next business day following your procedure.  We will call around 7:15- 8:00 am to check on you and address any questions or concerns that you may have regarding the information given to you following your procedure. If we do not reach you, we will leave a message.     If any biopsies were taken you will be contacted by phone or by letter within the next 1-3 weeks.  Please call us at [phone] if you have not heard about the biopsies in 3 weeks.    SIGNATURES/CONFIDENTIALITY: You and/or your care partner have signed paperwork which will be entered into your electronic medical record.  These signatures attest to the fact that that the information above on your After Visit Summary has been reviewed and is understood.  Full responsibility of the confidentiality of this discharge information lies with you and/or your care-partner. 

## 2024-08-24 ENCOUNTER — Telehealth: Payer: Self-pay

## 2024-08-24 ENCOUNTER — Other Ambulatory Visit: Payer: Self-pay | Admitting: Cardiovascular Disease

## 2024-08-24 DIAGNOSIS — I351 Nonrheumatic aortic (valve) insufficiency: Secondary | ICD-10-CM

## 2024-08-24 DIAGNOSIS — Z953 Presence of xenogenic heart valve: Secondary | ICD-10-CM

## 2024-08-24 DIAGNOSIS — I25118 Atherosclerotic heart disease of native coronary artery with other forms of angina pectoris: Secondary | ICD-10-CM

## 2024-08-24 DIAGNOSIS — I724 Aneurysm of artery of lower extremity: Secondary | ICD-10-CM

## 2024-08-24 DIAGNOSIS — I480 Paroxysmal atrial fibrillation: Secondary | ICD-10-CM

## 2024-08-24 DIAGNOSIS — I7121 Aneurysm of the ascending aorta, without rupture: Secondary | ICD-10-CM

## 2024-08-24 NOTE — Telephone Encounter (Signed)
  Follow up Call-     08/21/2024    8:19 AM  Call back number  Post procedure Call Back phone  # [phone]  Permission to leave phone message Yes     Patient questions:  Do you have a fever, pain , or abdominal swelling? No. Pain Score  0 *  Have you tolerated food without any problems? Yes.    Have you been able to return to your normal activities? Yes.    Do you have any questions about your discharge instructions: Diet   No. Medications  No. Follow up visit  No.  Do you have questions or concerns about your Care? No.  Actions: * If pain score is 4 or above: No action needed, pain <4.

## 2024-09-24 ENCOUNTER — Other Ambulatory Visit: Payer: Self-pay | Admitting: Cardiovascular Disease

## 2024-09-24 DIAGNOSIS — I724 Aneurysm of artery of lower extremity: Secondary | ICD-10-CM

## 2024-09-24 DIAGNOSIS — Z953 Presence of xenogenic heart valve: Secondary | ICD-10-CM

## 2024-09-24 DIAGNOSIS — I7121 Aneurysm of the ascending aorta, without rupture: Secondary | ICD-10-CM

## 2024-09-24 DIAGNOSIS — I480 Paroxysmal atrial fibrillation: Secondary | ICD-10-CM

## 2024-09-24 DIAGNOSIS — I25118 Atherosclerotic heart disease of native coronary artery with other forms of angina pectoris: Secondary | ICD-10-CM

## 2024-09-24 DIAGNOSIS — I351 Nonrheumatic aortic (valve) insufficiency: Secondary | ICD-10-CM

## 2024-09-29 ENCOUNTER — Ambulatory Visit: Admitting: Dermatology

## 2024-09-29 DIAGNOSIS — Z7189 Other specified counseling: Secondary | ICD-10-CM

## 2024-09-29 DIAGNOSIS — D229 Melanocytic nevi, unspecified: Secondary | ICD-10-CM

## 2024-09-29 DIAGNOSIS — W908XXA Exposure to other nonionizing radiation, initial encounter: Secondary | ICD-10-CM | POA: Diagnosis not present

## 2024-09-29 DIAGNOSIS — L821 Other seborrheic keratosis: Secondary | ICD-10-CM

## 2024-09-29 DIAGNOSIS — L578 Other skin changes due to chronic exposure to nonionizing radiation: Secondary | ICD-10-CM | POA: Diagnosis not present

## 2024-09-29 DIAGNOSIS — L219 Seborrheic dermatitis, unspecified: Secondary | ICD-10-CM | POA: Diagnosis not present

## 2024-09-29 DIAGNOSIS — L814 Other melanin hyperpigmentation: Secondary | ICD-10-CM | POA: Diagnosis not present

## 2024-09-29 DIAGNOSIS — Z1283 Encounter for screening for malignant neoplasm of skin: Secondary | ICD-10-CM

## 2024-09-29 DIAGNOSIS — D1801 Hemangioma of skin and subcutaneous tissue: Secondary | ICD-10-CM

## 2024-09-29 DIAGNOSIS — Z79899 Other long term (current) drug therapy: Secondary | ICD-10-CM

## 2024-09-29 DIAGNOSIS — L820 Inflamed seborrheic keratosis: Secondary | ICD-10-CM | POA: Diagnosis not present

## 2024-09-29 MED ORDER — KETOCONAZOLE 2 % EX SHAM: MEDICATED_SHAMPOO | CUTANEOUS | 11 refills | Status: DC

## 2024-09-29 MED ORDER — KETOCONAZOLE 2 % EX SHAM: MEDICATED_SHAMPOO | CUTANEOUS | 11 refills | Status: AC

## 2024-09-29 MED ORDER — MOMETASONE FUROATE 0.1 % EX SOLN: CUTANEOUS | 8 refills | Status: AC

## 2024-09-29 NOTE — Patient Instructions (Addendum)
 Seborrheic Keratosis  What causes seborrheic keratoses? Seborrheic keratoses are harmless, common skin growths that first appear during adult life.  As time goes by, more growths appear.  Some people may develop a large number of them.  Seborrheic keratoses appear on both covered and uncovered body parts.  They are not caused by sunlight.  The tendency to develop seborrheic keratoses can be inherited.  They vary in color from skin-colored to gray, brown, or even black.  They can be either smooth or have a rough, warty surface.   Seborrheic keratoses are superficial and look as if they were stuck on the skin.  Under the microscope this type of keratosis looks like layers upon layers of skin.  That is why at times the top layer may seem to fall off, but the rest of the growth remains and re-grows.    Treatment Seborrheic keratoses do not need to be treated, but can easily be removed in the office.  Seborrheic keratoses often cause symptoms when they rub on clothing or jewelry.  Lesions can be in the way of shaving.  If they become inflamed, they can cause itching, soreness, or burning.  Removal of a seborrheic keratosis can be accomplished by freezing, burning, or surgery. If any spot bleeds, scabs, or grows rapidly, please return to have it checked, as these can be an indication of a skin cancer.   Cryotherapy Aftercare  Wash gently with soap and water everyday.   Apply Vaseline and Band-Aid daily until healed.    Actinic keratoses are precancerous spots that appear secondary to cumulative UV radiation exposure/sun exposure over time. They are chronic with expected duration over 1 year. A portion of actinic keratoses will progress to squamous cell carcinoma of the skin. It is not possible to reliably predict which spots will progress to skin cancer and so treatment is recommended to prevent development of skin cancer.  Recommend daily broad spectrum sunscreen SPF 30+ to sun-exposed areas, reapply  every 2 hours as needed.  Recommend staying in the shade or wearing long sleeves, sun glasses (UVA+UVB protection) and wide brim hats (4-inch brim around the entire circumference of the hat). Call for new or changing lesions.     Melanoma ABCDEs  Melanoma is the most dangerous type of skin cancer, and is the leading cause of death from skin disease.  You are more likely to develop melanoma if you: Have light-colored skin, light-colored eyes, or red or blond hair Spend a lot of time in the sun Tan regularly, either outdoors or in a tanning bed Have had blistering sunburns, especially during childhood Have a close family member who has had a melanoma Have atypical moles or large birthmarks  Early detection of melanoma is key since treatment is typically straightforward and cure rates are extremely high if we catch it early.   The first sign of melanoma is often a change in a mole or a new dark spot.  The ABCDE system is a way of remembering the signs of melanoma.  A for asymmetry:  The two halves do not match. B for border:  The edges of the growth are irregular. C for color:  A mixture of colors are present instead of an even brown color. D for diameter:  Melanomas are usually (but not always) greater than 6mm - the size of a pencil eraser. E for evolution:  The spot keeps changing in size, shape, and color.  Please check your skin once per month between visits. You can  use a small mirror in front and a large mirror behind you to keep an eye on the back side or your body.   If you see any new or changing lesions before your next follow-up, please call to schedule a visit.  Please continue daily skin protection including broad spectrum sunscreen SPF 30+ to sun-exposed areas, reapplying every 2 hours as needed when you're outdoors.   Staying in the shade or wearing long sleeves, sun glasses (UVA+UVB protection) and wide brim hats (4-inch brim around the entire circumference of the hat)  are also recommended for sun protection.    Due to recent changes in healthcare laws, you may see results of your pathology and/or laboratory studies on MyChart before the doctors have had a chance to review them. We understand that in some cases there may be results that are confusing or concerning to you. Please understand that not all results are received at the same time and often the doctors may need to interpret multiple results in order to provide you with the best plan of care or course of treatment. Therefore, we ask that you please give us  2 business days to thoroughly review all your results before contacting the office for clarification. Should we see a critical lab result, you will be contacted sooner.   If You Need Anything After Your Visit  If you have any questions or concerns for your doctor, please call our main line at [phone] and press option 4 to reach your doctor's medical assistant. If no one answers, please leave a voicemail as directed and we will return your call as soon as possible. Messages left after 4 pm will be answered the following business day.   You may also send us  a message via MyChart. We typically respond to MyChart messages within 1-2 business days.  For prescription refills, please ask your pharmacy to contact our office. Our fax number is [phone].  If you have an urgent issue when the clinic is closed that cannot wait until the next business day, you can page your doctor at the number below.    Please note that while we do our best to be available for urgent issues outside of office hours, we are not available 24/7.   If you have an urgent issue and are unable to reach us , you may choose to seek medical care at your doctor's office, retail clinic, urgent care center, or emergency room.  If you have a medical emergency, please immediately call 911 or go to the emergency department.  Pager Numbers  - Dr. Hester: [phone]  - Dr. Jackquline:  [phone]  - Dr. Claudene: [phone]   - Dr. Raymund: [phone]  In the event of inclement weather, please call our main line at [phone] for an update on the status of any delays or closures.  Dermatology Medication Tips: Please keep the boxes that topical medications come in in order to help keep track of the instructions about where and how to use these. Pharmacies typically print the medication instructions only on the boxes and not directly on the medication tubes.   If your medication is too expensive, please contact our office at [phone] option 4 or send us  a message through MyChart.   We are unable to tell what your co-pay for medications will be in advance as this is different depending on your insurance coverage. However, we may be able to find a substitute medication at lower cost or fill out paperwork to get insurance to cover  a needed medication.   If a prior authorization is required to get your medication covered by your insurance company, please allow us  1-2 business days to complete this process.  Drug prices often vary depending on where the prescription is filled and some pharmacies may offer cheaper prices.  The website www.goodrx.com contains coupons for medications through different pharmacies. The prices here do not account for what the cost may be with help from insurance (it may be cheaper with your insurance), but the website can give you the price if you did not use any insurance.  - You can print the associated coupon and take it with your prescription to the pharmacy.  - You may also stop by our office during regular business hours and pick up a GoodRx coupon card.  - If you need your prescription sent electronically to a different pharmacy, notify our office through [facility] or by phone at [phone] option 4.     Si Usted Necesita Algo Despus de Su Visita  Tambin puede enviarnos un mensaje a travs de Clinical cytogeneticist. Por lo general  respondemos a los mensajes de MyChart en el transcurso de 1 a 2 das hbiles.  Para renovar recetas, por favor pida a su farmacia que se ponga en contacto con nuestra oficina. Randi lakes de fax es [place] [phone].  Si tiene un asunto urgente cuando la clnica est cerrada y que no puede esperar hasta el siguiente da hbil, puede llamar/localizar a su doctor(a) al nmero que aparece a continuacin.   Por favor, tenga en cuenta que aunque hacemos todo lo posible para estar disponibles para asuntos urgentes fuera del horario de [place], no estamos disponibles las 24 horas del da, los 7 [address]  [address] de la [place].   Si tiene un problema urgente y no puede comunicarse con nosotros, puede optar por buscar atencin mdica  en el consultorio de su doctor(a), en una clnica privada, en un centro de atencin urgente o en una sala de emergencias.  Si tiene Engineer, drilling, por favor llame inmediatamente al 911 o vaya a la sala de emergencias.  Nmeros de bper  - Dr. Hester: [phone]  - Dra. Jackquline: [phone]  - Dr. Claudene: [phone]  - Dra. Kitts: [phone]  En caso de inclemencias del [place], por favor llame a nuestra lnea principal al [phone] para una actualizacin sobre el estado de cualquier retraso o cierre.  Consejos para la medicacin en dermatologa: Por favor, guarde las cajas en las que vienen los medicamentos de uso tpico para ayudarle a seguir las instrucciones sobre dnde y cmo usarlos. Las farmacias generalmente imprimen las instrucciones del medicamento slo en las cajas y no directamente en los tubos del [place].   Si su medicamento es muy caro, por favor, pngase en contacto con landry rieger llamando al [phone] y presione la opcin 4 o envenos un mensaje a travs de Clinical cytogeneticist.   No podemos decirle cul ser su copago por los medicamentos por adelantado ya que esto es diferente dependiendo de la cobertura de su seguro. Sin embargo, es posible  que podamos encontrar un medicamento sustituto a Audiological scientist un formulario para que el seguro cubra el medicamento que se considera necesario.   Si se requiere una autorizacin previa para que su compaa de seguros malta su medicamento, por favor permtanos de 1 a 2 das hbiles para completar este proceso.  Los precios de los medicamentos varan con frecuencia dependiendo del Environmental consultant de dnde se surte la receta y iraq  pueden ofrecer precios ms baratos.  El sitio web www.goodrx.com tiene cupones para medicamentos de Health and safety inspector. Los precios aqu no tienen en cuenta lo que podra costar con la ayuda del seguro (puede ser ms barato con su seguro), pero el sitio web puede darle el precio si no utiliz Tourist information centre manager.  - Puede imprimir el cupn correspondiente y llevarlo con su receta a la farmacia.  - Tambin puede pasar por nuestra oficina durante el horario de atencin regular y Education officer, museum una tarjeta de cupones de GoodRx.  - Si necesita que su receta se enve electrnicamente a una farmacia diferente, informe a nuestra oficina a travs de MyChart de [place] o por telfono llamando al [phone] y presione la opcin 4.

## 2024-09-29 NOTE — Progress Notes (Unsigned)
 Follow-Up Visit   Subjective  Jeffrey Tucker is a 68 y.o. male who presents for the following: Skin Cancer Screening and Full Body Skin Exam Hx of isk  Hx of  Issues scalp  Spot under right axilla   The patient presents for Total-Body Skin Exam (TBSE) for skin cancer screening and mole check. The patient has spots, moles and lesions to be evaluated, some may be new or changing and the patient may have concern these could be cancer.  The following portions of the chart were reviewed this encounter and updated as appropriate: medications, allergies, medical history  Review of Systems:  No other skin or systemic complaints except as noted in HPI or Assessment and Plan.  Objective  Well appearing patient in no apparent distress; mood and affect are within normal limits.  A full examination was performed including scalp, head, eyes, ears, nose, lips, neck, chest, axillae, abdomen, back, buttocks, bilateral upper extremities, bilateral lower extremities, hands, feet, fingers, toes, fingernails, and toenails. All findings within normal limits unless otherwise noted below.   Relevant physical exam findings are noted in the Assessment and Plan.  back x 1, b/l axilla x 4 (5) Erythematous stuck-on, waxy papule or plaque  Assessment & Plan   SKIN CANCER SCREENING PERFORMED TODAY.  ACTINIC DAMAGE - Chronic condition, secondary to cumulative UV/sun exposure - diffuse scaly erythematous macules with underlying dyspigmentation - Recommend daily broad spectrum sunscreen SPF 30+ to sun-exposed areas, reapply every 2 hours as needed.  - Staying in the shade or wearing long sleeves, sun glasses (UVA+UVB protection) and wide brim hats (4-inch brim around the entire circumference of the hat) are also recommended for sun protection.  - Call for new or changing lesions.  LENTIGINES, SEBORRHEIC KERATOSES, HEMANGIOMAS - Benign normal skin lesions - Benign-appearing - Call for any changes  MELANOCYTIC  NEVI - Tan-brown and/or pink-flesh-colored symmetric macules and papules - Benign appearing on exam today - Observation - Call clinic for new or changing moles - Recommend daily use of broad spectrum spf 30+ sunscreen to sun-exposed areas.   SEBORRHEIC DERMATITIS Exam: Scalp clear Chronic condition with duration or expected duration over one year. Currently well-controlled. Seborrheic Dermatitis is a chronic persistent rash characterized by pinkness and scaling most commonly of the mid face but also can occur on the scalp (dandruff), ears; mid chest, mid back and groin.  It tends to be exacerbated by stress and cooler weather.  People who have neurologic disease may experience new onset or exacerbation of existing seborrheic dermatitis.  The condition is not curable but treatable and can be controlled. Treatment Plan: Continue Ketoconazole  2% shampoo - massage into scalp 2 times per week, let sit several minutes before rinsing 1 yr Rf.  Continue Mometasone  lotion twice a week prn scalp for itch or scale.  SEBORRHEIC DERMATITIS   Related Medications mometasone  (ELOCON ) 0.1 % lotion APPLY TOPICALLY 2 TIMES A WEEK for seborrheic dermatitis ketoconazole  (NIZORAL ) 2 % shampoo Massage into scalp 1-2 times per week, let sit several minutes before rinsing For seborrheic dermatitis INFLAMED SEBORRHEIC KERATOSIS (5) back x 1, b/l axilla x 4 (5) Symptomatic, irritating, patient would like treated. Destruction of lesion - back x 1, b/l axilla x 4 (5) Complexity: simple   Destruction method: cryotherapy   Informed consent: discussed and consent obtained   Timeout:  patient name, date of birth, surgical site, and procedure verified Lesion destroyed using liquid nitrogen: Yes   Region frozen until ice ball extended beyond lesion:  Yes   Outcome: patient tolerated procedure well with no complications   Post-procedure details: wound care instructions given    Return in about 1 year (around  09/29/2025) for TBSE.  IEleanor Blush, CMA, am acting as scribe for Alm Rhyme, MD.   Documentation: I have reviewed the above documentation for accuracy and completeness, and I agree with the above.  Alm Rhyme, MD

## 2024-09-30 ENCOUNTER — Encounter: Payer: Self-pay | Admitting: Dermatology

## 2024-10-08 ENCOUNTER — Telehealth: Payer: Self-pay

## 2024-10-08 ENCOUNTER — Encounter: Payer: Self-pay | Admitting: Family Medicine

## 2024-10-08 NOTE — Telephone Encounter (Signed)
 Jeffrey Tucker was trying to get a message to Dr. KANDICE who is his new PCP since Dr. Jimmy retired.  When he went into my chart to enter the message, it would only bring up Dr. Jimmy. I told the patient to send the message anyway, that we would get the message and assist based on his needs  Patient understood

## 2024-10-08 NOTE — Telephone Encounter (Signed)
 Copied from CRM [id number]. Topic: General - Other >> Oct 08, 2024  9:40 AM Revonda D wrote: Reason for CRM: Pt is requesting to speak with Dr.Gutierrez's nurse. Pt didn't provide the reason for the request.

## 2024-10-08 NOTE — Telephone Encounter (Signed)
 Called pt but was having reception issues. Left message to call office.

## 2024-10-16 ENCOUNTER — Encounter: Payer: Self-pay | Admitting: Family Medicine

## 2024-10-16 ENCOUNTER — Ambulatory Visit (INDEPENDENT_AMBULATORY_CARE_PROVIDER_SITE_OTHER): Admitting: Family Medicine

## 2024-10-16 VITALS — BP 130/80 | HR 62 | Temp 97.9°F | Ht 66.0 in | Wt 177.0 lb

## 2024-10-16 DIAGNOSIS — E538 Deficiency of other specified B group vitamins: Secondary | ICD-10-CM | POA: Diagnosis not present

## 2024-10-16 DIAGNOSIS — R8290 Unspecified abnormal findings in urine: Secondary | ICD-10-CM | POA: Diagnosis not present

## 2024-10-16 LAB — COMPREHENSIVE METABOLIC PANEL WITH GFR
ALT: 25 U/L (ref 0–53)
AST: 21 U/L (ref 0–37)
Albumin: 4.3 g/dL (ref 3.5–5.2)
Alkaline Phosphatase: 84 U/L (ref 39–117)
BUN: 11 mg/dL (ref 6–23)
CO2: 27 meq/L (ref 19–32)
Calcium: 9 mg/dL (ref 8.4–10.5)
Chloride: 105 meq/L (ref 96–112)
Creatinine, Ser: 0.8 mg/dL (ref 0.40–1.50)
GFR: 90.74 mL/min (ref >60.00)
Glucose, Bld: 119 mg/dL — ABNORMAL HIGH (ref 70–99)
Potassium: 3.6 meq/L (ref 3.5–5.1)
Sodium: 141 meq/L (ref 135–145)
Total Bilirubin: 0.7 mg/dL (ref 0.2–1.2)
Total Protein: 7.1 g/dL (ref 6.0–8.3)

## 2024-10-16 LAB — POC URINALSYSI DIPSTICK (AUTOMATED)
Bilirubin, UA: NEGATIVE
Blood, UA: NEGATIVE
Glucose, UA: NEGATIVE
Ketones, UA: NEGATIVE
Leukocytes, UA: NEGATIVE
Nitrite, UA: NEGATIVE
Protein, UA: NEGATIVE
Spec Grav, UA: 1.015 (ref 1.010–1.025)
Urobilinogen, UA: 0.2 U/dL
pH, UA: 6 (ref 5.0–8.0)

## 2024-10-16 LAB — VITAMIN B12: Vitamin B-12: 1143 pg/mL — ABNORMAL HIGH (ref 211–911)

## 2024-10-16 MED ORDER — VITAMIN B-12 1000 MCG PO TABS: 1000.0000 ug | ORAL_TABLET | ORAL | Status: DC

## 2024-10-16 MED ORDER — VITAMIN B-12 1000 MCG PO TABS: 1000.0000 ug | ORAL_TABLET | Freq: Every day | ORAL | Status: DC

## 2024-10-16 NOTE — Patient Instructions (Addendum)
 Urine test looking ok.  Blood test today.   VISIT SUMMARY: Today, you came in because you noticed a change in your urine odor over the past three to four weeks. You do not have any pain or other symptoms like fever or nausea. We discussed your diet, hydration, and current medications.  YOUR PLAN: -ABNORMAL URINE ODOR: A change in urine odor can sometimes be caused by dietary factors or excess vitamins. We do not suspect a urinary tract infection. We have ordered blood tests to check your kidney and liver function. Please increase your water intake and monitor for any new symptoms.  -VITAMIN B12 EXCESS: High levels of Vitamin B12 can sometimes occur from taking too much of the supplement. You do not have any symptoms like jitteriness. Please reduce your B12 dose to 500 mcg daily or take it every other day. We have also ordered a blood test to check your B12 levels.  INSTRUCTIONS: Please follow up with the blood tests for kidney and liver function, as well as for B12 levels. Monitor for any new symptoms and increase your water intake. If you notice any changes or have concerns, please contact our office.

## 2024-10-16 NOTE — Assessment & Plan Note (Signed)
 See above

## 2024-10-16 NOTE — Assessment & Plan Note (Signed)
 Latest level high - update B12, recommended hold x1 wk then change to MWF. He thinks he's taking tablets.

## 2024-10-16 NOTE — Progress Notes (Signed)
 Ph: [phone] Fax: [phone]   Patient ID: Jeffrey Tucker, male    DOB: 04/03/1956, 68 y.o.   MRN: [id number]  This visit was conducted in person.  BP 130/80   Pulse 62   Temp 97.9 F (36.6 C) (Oral)   Ht 5' 6 (1.676 m)   Wt 177 lb (80.3 kg)   SpO2 96%   BMI 28.57 kg/m   BP Readings from Last 3 Encounters:  10/16/24 130/80  08/21/24 (!) 145/86  07/21/24 120/84    Chief Complaint  Patient presents with   Medical Management of Chronic Issues    Pt here for odor in urine x 1 mth.     Subjective:   Discussed the use of AI scribe software for clinical note transcription with the patient, who gave verbal consent to proceed.  History of Present Illness   Jeffrey Tucker is a 68 year old male who presents with a change in urine odor over the past three weeks.  He experiences an unusual change in urine odor for the past three weeks. There is no dysuria, urgency, frequency, or nocturia beyond his usual pattern of waking once or twice at night. He denies fever, chills, nausea, abdominal pain, or flank pain. Occasionally, he has mild sensations in his lower back that are neither consistent nor painful.  He maintains a consistent diet and hydration routine, consuming approximately sixteen ounces of water every four hours starting in the morning. His diet includes water, yogurt, granola, cream of wheat, oatmeal, bananas, and other fruits, with no recent changes.  Current medications include aspirin , allergy medicine, cholesterol medicine, a breathing medicine, and a thyroid  medicine. He takes a B12 supplement at a dose of 1000 mcg daily.          Relevant past medical, surgical, family and social history reviewed and updated as indicated. Interim medical history since our last visit reviewed. Allergies and medications reviewed and updated. Outpatient Medications Prior to Visit  Medication Sig Dispense Refill   albuterol  (VENTOLIN  HFA) 108 (90 Base) MCG/ACT inhaler  Inhale 2 puffs into the lungs 3 (three) times daily as needed for wheezing or shortness of breath. 18 g 1   aspirin  EC 81 MG EC tablet Take 1 tablet (81 mg total) by mouth daily. 30 tablet 0   atorvastatin  (LIPITOR ) 40 MG tablet TAKE 1 TABLET BY MOUTH DAILY 30 tablet 2   fluticasone -salmeterol (ADVAIR) 100-50 MCG/ACT AEPB Inhale 1 puff into the lungs 2 (two) times daily. 60 each 1   ketoconazole  (NIZORAL ) 2 % shampoo Massage into scalp 1-2 times per week, let sit several minutes before rinsing For seborrheic dermatitis 120 mL 11   levocetirizine (XYZAL) 5 MG tablet Take 5 mg by mouth every evening.     levothyroxine  (SYNTHROID ) 125 MCG tablet TAKE 1 TABLET BY MOUTH DAILY 90 tablet 3   mometasone  (ELOCON ) 0.1 % lotion APPLY TOPICALLY 2 TIMES A WEEK for seborrheic dermatitis 60 mL 8   montelukast  (SINGULAIR ) 10 MG tablet Take 10 mg by mouth at bedtime.     UNABLE TO FIND Med Name: Allergy Shots weekly     valACYclovir  (VALTREX ) 1000 MG tablet TAKE 2 TABLETS BY MOUTH FOR COLD SORE, THEN REPEAT 2 TABLETS BY MOUTH IN 12 HOURS 20 tablet 0   nitroGLYCERIN  (NITRODUR - DOSED IN MG/24 HR) 0.2 mg/hr patch Place 1 patch onto the skin. (Patient not taking: Reported on 10/16/2024)     No facility-administered medications prior to visit.  Per HPI unless specifically indicated in ROS section below Review of Systems  Objective:  BP 130/80   Pulse 62   Temp 97.9 F (36.6 C) (Oral)   Ht 5' 6 (1.676 m)   Wt 177 lb (80.3 kg)   SpO2 96%   BMI 28.57 kg/m   Wt Readings from Last 3 Encounters:  10/16/24 177 lb (80.3 kg)  08/21/24 170 lb (77.1 kg)  07/21/24 166 lb 12.8 oz (75.7 kg)      Physical Exam Vitals and nursing note reviewed.  Constitutional:      Appearance: Normal appearance. He is not ill-appearing.  HENT:     Head: Normocephalic and atraumatic.     Mouth/Throat:     Mouth: Mucous membranes are moist.     Pharynx: Oropharynx is clear. No oropharyngeal exudate or posterior  oropharyngeal erythema.  Eyes:     Extraocular Movements: Extraocular movements intact.     Pupils: Pupils are equal, round, and reactive to light.  Neck:     Thyroid : No thyroid  mass or thyromegaly.  Abdominal:     General: Bowel sounds are normal. There is no distension.     Palpations: Abdomen is soft. There is no mass.     Tenderness: There is no abdominal tenderness. There is no right CVA tenderness, left CVA tenderness, guarding or rebound.     Hernia: No hernia is present.  Musculoskeletal:     Cervical back: Normal range of motion and neck supple.     Right lower leg: No edema.     Left lower leg: No edema.  Skin:    General: Skin is warm and dry.     Findings: No rash.  Psychiatric:        Mood and Affect: Mood normal.        Behavior: Behavior normal.           Results for orders placed or performed in visit on 10/16/24  POCT Urinalysis Dipstick (Automated)   Collection Time: 10/16/24  7:52 AM  Result Value Ref Range   Color, UA yellow    Clarity, UA clear    Glucose, UA Negative Negative   Bilirubin, UA Negative    Ketones, UA Negative    Spec Grav, UA 1.015 1.010 - 1.025   Blood, UA Negative    pH, UA 6.0 5.0 - 8.0   Protein, UA Negative Negative   Urobilinogen, UA 0.2 0.2 or 1.0 E.U./dL   Nitrite, UA Negative    Leukocytes, UA Negative Negative  ( Assessment & Plan:       Abnormal urine odor No UTI. Possible dietary factors or vitamin B12 excess. - Ordered blood tests for kidney and liver function. - Advised increased water intake. - Instructed to monitor for new symptoms.  Vitamin B12 excess High B12 levels likely from excessive supplementation. No jitteriness reported. - Reduce B12 dose to 500 mcg daily or 1000mcg every other day. - Ordered blood test for B12 levels.  Recording duration: 15 minutes        Problem List Items Addressed This Visit     B12 deficiency   Latest level high - update B12, recommended hold x1 wk then change to  MWF. He thinks he's taking tablets.       Relevant Orders   Vitamin B12   Foul smelling urine - Primary   See above      Relevant Orders   POCT Urinalysis Dipstick (Automated) (Completed)   Comprehensive metabolic panel  with GFR   Vitamin B12     Meds ordered this encounter  Medications   DISCONTD: cyanocobalamin  (VITAMIN B12) 1000 MCG tablet    Sig: Take 1 tablet (1,000 mcg total) by mouth daily.   cyanocobalamin  (VITAMIN B12) 1000 MCG tablet    Sig: Take 1 tablet (1,000 mcg total) by mouth every Monday, Wednesday, and Friday.    Orders Placed This Encounter  Procedures   Comprehensive metabolic panel with GFR   Vitamin B12   POCT Urinalysis Dipstick (Automated)    Patient Instructions  Urine test looking ok.  Blood test today.   VISIT SUMMARY: Today, you came in because you noticed a change in your urine odor over the past three to four weeks. You do not have any pain or other symptoms like fever or nausea. We discussed your diet, hydration, and current medications.  YOUR PLAN: -ABNORMAL URINE ODOR: A change in urine odor can sometimes be caused by dietary factors or excess vitamins. We do not suspect a urinary tract infection. We have ordered blood tests to check your kidney and liver function. Please increase your water intake and monitor for any new symptoms.  -VITAMIN B12 EXCESS: High levels of Vitamin B12 can sometimes occur from taking too much of the supplement. You do not have any symptoms like jitteriness. Please reduce your B12 dose to 500 mcg daily or take it every other day. We have also ordered a blood test to check your B12 levels.  INSTRUCTIONS: Please follow up with the blood tests for kidney and liver function, as well as for B12 levels. Monitor for any new symptoms and increase your water intake. If you notice any changes or have concerns, please contact our office.  Follow up plan: Return if symptoms worsen or fail to improve.  Anton Blas, MD

## 2024-10-18 ENCOUNTER — Ambulatory Visit: Payer: Self-pay | Admitting: Family Medicine

## 2024-10-19 NOTE — Telephone Encounter (Signed)
 Last read by Ozell JAYSON Debbie Garrel at 9:14AM on 10/19/2024.

## 2024-10-26 NOTE — Progress Notes (Unsigned)
 Cardiology Office Note  Date:  10/27/2024   ID:  AVANT PRINTY, DOB August 23, 1956, MRN [id number]  PCP:  Rilla Baller, MD   Chief Complaint  Patient presents with   12 month follow up     Patient c/o chest discomfort and shortness of breath when walking a fast pace. He has noticed the symptoms for the past couple of months.     HPI:  Mr. Towell is a very pleasant 68 year-old gentleman with  moderate aortic valve regurgitation, Echo 2020:  dilatation of the aortic root 4.9 cm and of the ascending aorta 4.4 cm, aortic arch 3.7 cm. bicuspid aortic valve.  biological Bentall aortic root replacement and coronary artery bypass grafting x2 on August 27, 2019 He presents for follow-up after aortic valve replacement aortic root replacement, CABG, readmission for bleed  Last seen by myself in clinic  June 2024 On discussion today reports that he remains active, tries to walk 15 minutes daily, 1 hour on the weekends around [facility]  For the past 2-3 months  slow onset of symptoms Appreciated less energy, more shortness of breath, some discomfort in the chest Two weeks ago Sunday, took longer to walk, walked slower secondary to symptoms Feels this is a change from prior activity level  No PND orthopnea, no leg swelling  Remains on Lipitor  40 daily Total cholesterol 150  Remains at work, airline pilot, taxes and bookkeeping   echo 9/24, EF 55% bioprosthetic valve present in the aortic position. Procedure Date: 08/27/19. Aortic valve mean gradient measures 4.8 mmHg.   EKG personally reviewed by myself on todays visit EKG Interpretation Date/Time:  Tuesday October 27 2024 14:50:35 EST Ventricular Rate:  47 PR Interval:  208 QRS Duration:  110 QT Interval:  462 QTC Calculation: 408 R Axis:   -26  Text Interpretation: Sinus bradycardia When compared with ECG of 17-Dec-2023 09:04, QRS axis Shifted left Confirmed by Perla Lye [phone]) on 10/27/2024 3:01:55 PM   Numbers below on  Lipitor  Lab Results  Component Value Date   CHOL 152 06/15/2024   HDL 28.40 (L) 06/15/2024   LDLCALC 64 06/15/2024   TRIG 299.0 (H) 06/15/2024   Prior cardiac history reviewed   biological Bentall aortic root replacement and coronary artery bypass grafting x2 on August 27, 2019 by Dr. German.   discharged from the hospital on the seventh postoperative day.  readmitted to the hospital September 16, 2019 with severe chest pain and tachycardia.  CT scan and transthoracic echocardiogram revealed slight increase in the amount of pericardial effusion and/or blood in the mediastinum.  brief episode of atrial fibrillation , treated with amiodarone . September 23, 2019 had a brief episode of hypotension which resolved without significant intervention.   developed bloody drainage from his sternal wound.   wife found him unresponsive.  She began CPR.   EMS was called and brought him directly to the emergency room where he was unresponsive on arrival  intubated and placed on mechanical ventilation. He was seen urgently by Dr Dusty and was taken urgently to the Operating Room for exploration and repair.ligation of grafts  CT scan to have a right femoral pseudoaneurysm and vascular surgical consultation was obtained.    pseudoaneurysm measures 2 x 1 cm with a short neck.  surgical repair    PMH:   has a past medical history of Allergy, Aortic root dilatation, Asthma, Bicuspid aortic valve, BPH (benign prostatic hypertrophy), CAD (coronary artery disease), GERD (gastroesophageal reflux disease), History of kidney stones, Hypertension, Hypertriglyceridemia,  Hypothyroidism, Pericardial tamponade (09/25/2019), Severe aortic regurgitation, and Wears glasses.  PSH:    Past Surgical History:  Procedure Laterality Date   BENTALL PROCEDURE N/A 08/27/2019   Procedure: BENTALL PROCEDURE with a 27 mm KONECT Resilia Aortic Valved Conduit and 30 mm x 30 cm Hemashield Platinum straight graft.;  Surgeon: German Bartlett PEDLAR, MD;  Location: [facility] OR;  Service: Open Heart Surgery;  Laterality: N/A;   CARDIAC CATHETERIZATION  2006   [place]   CARDIOVASCULAR STRESS TEST  2006   [place]   CHOLECYSTECTOMY  2006   CORONARY ARTERY BYPASS GRAFT N/A 08/27/2019   Procedure: CORONARY ARTERY BYPASS GRAFTING (CABG) times 2 using right saphenous endoscopic vein harvesting to OM2 nad PDA.;  Surgeon: German Bartlett PEDLAR, MD;  Location: [facility] OR;  Service: Open Heart Surgery;  Laterality: N/A;   CYSTOSCOPY W/ URETERAL STENT PLACEMENT  05/24/2017   Procedure: left;  Surgeon: Chauncey Redell Agent, MD;  Location: [facility] ORS;  Service: Urology;;   CYSTOSCOPY W/ URETERAL STENT PLACEMENT Left 06/11/2017   Procedure: CYSTOSCOPY WITH STENT REPLACEMENT;  Surgeon: Penne Knee, MD;  Location: [facility] ORS;  Service: Urology;  Laterality: Left;   CYSTOSCOPY WITH BIOPSY Left 06/11/2017   Procedure: RENAL PELVIS TUMOR WITH BIOPSY WITH POSSIBLE LASER ABLATION;  Surgeon: Penne Knee, MD;  Location: [facility] ORS;  Service: Urology;  Laterality: Left;   CYSTOSCOPY WITH STENT PLACEMENT Left 05/24/2017   Procedure: , cystoscopy,left ureteral stent placement and left ureteroscopy attempted, retrograde pylogram;  Surgeon: Chauncey Redell Agent, MD;  Location: [facility] ORS;  Service: Urology;  Laterality: Left;   EYE SURGERY Bilateral    Cataract Extraction with IOL   FALSE ANEURYSM REPAIR  10/29/2019   REPAIR FALSE ANEURYSM RIGHT FEMORAL    FALSE ANEURYSM REPAIR Right 10/29/2019   Procedure: REPAIR FALSE ANEURYSM RIGHT FEMORAL;  Surgeon: Gretta Lonni PARAS, MD;  Location: [facility] OR;  Service: Vascular;  Laterality: Right;   HEMATOMA EVACUATION N/A 09/25/2019   Procedure: Evacuation Hematoma - Mediastinal Ligation of saphenous vein graft x two with Cardiopulmonary Bypass;  Surgeon: Dusty Sudie DEL, MD;  Location: [facility] OR;  Service: Thoracic;  Laterality: N/A;   IR THORACENTESIS ASP PLEURAL SPACE W/IMG GUIDE  09/02/2019   RIGHT/LEFT HEART CATH AND CORONARY  ANGIOGRAPHY N/A 08/21/2019   Procedure: RIGHT/LEFT HEART CATH AND CORONARY ANGIOGRAPHY;  Surgeon: Perla Evalene PARAS, MD;  Location: [facility] INVASIVE CV LAB;  Service: Cardiovascular;  Laterality: N/A;   STERNOTOMY N/A 09/25/2019   Procedure: REDO STERNOTOMY;  Surgeon: Dusty Sudie DEL, MD;  Location: [facility];  Service: Thoracic;  Laterality: N/A;   TEE WITHOUT CARDIOVERSION N/A 08/27/2019   Procedure: TRANSESOPHAGEAL ECHOCARDIOGRAM (TEE);  Surgeon: German Bartlett PEDLAR, MD;  Location: [facility] OR;  Service: Open Heart Surgery;  Laterality: N/A;   TEE WITHOUT CARDIOVERSION N/A 09/25/2019   Procedure: Transesophageal Echocardiogram (Tee);  Surgeon: Dusty Sudie DEL, MD;  Location: [facility] OR;  Service: Thoracic;  Laterality: N/A;   URETEROSCOPY WITH HOLMIUM LASER LITHOTRIPSY Left 06/11/2017   Procedure: URETEROSCOPY WITH HOLMIUM LASER LITHOTRIPSY;  Surgeon: Penne Knee, MD;  Location: [facility] ORS;  Service: Urology;  Laterality: Left;   VEIN LIGATION AND STRIPPING Bilateral 1997    Current Outpatient Medications  Medication Sig Dispense Refill   albuterol  (VENTOLIN  HFA) 108 (90 Base) MCG/ACT inhaler Inhale 2 puffs into the lungs 3 (three) times daily as needed for wheezing or shortness of breath. 18 g 1   aspirin  EC 81 MG EC tablet Take 1 tablet (81 mg total) by mouth  daily. 30 tablet 0   atorvastatin  (LIPITOR ) 40 MG tablet TAKE 1 TABLET BY MOUTH DAILY 30 tablet 2   cyanocobalamin  (VITAMIN B12) 1000 MCG tablet Take 1 tablet (1,000 mcg total) by mouth every Monday, Wednesday, and Friday.     fluticasone -salmeterol (ADVAIR) 100-50 MCG/ACT AEPB Inhale 1 puff into the lungs 2 (two) times daily. 60 each 1   ketoconazole  (NIZORAL ) 2 % shampoo Massage into scalp 1-2 times per week, let sit several minutes before rinsing For seborrheic dermatitis 120 mL 11   levocetirizine (XYZAL) 5 MG tablet Take 5 mg by mouth every evening.     levothyroxine  (SYNTHROID ) 125 MCG tablet TAKE 1 TABLET BY MOUTH DAILY 90 tablet 3    mometasone  (ELOCON ) 0.1 % lotion APPLY TOPICALLY 2 TIMES A WEEK for seborrheic dermatitis 60 mL 8   montelukast  (SINGULAIR ) 10 MG tablet Take 10 mg by mouth at bedtime.     UNABLE TO FIND Med Name: Allergy Shots weekly     valACYclovir  (VALTREX ) 1000 MG tablet TAKE 2 TABLETS BY MOUTH FOR COLD SORE, THEN REPEAT 2 TABLETS BY MOUTH IN 12 HOURS 20 tablet 0   No current facility-administered medications for this visit.     Allergies:   Tetracycline hcl, Beta adrenergic blockers, Metoprolol  tartrate, and Amlodipine    Social History:  The patient  reports that he quit smoking about 45 years ago. His smoking use included cigarettes. He has been exposed to tobacco smoke. He has never used smokeless tobacco. He reports that he does not drink alcohol and does not use drugs.   Family History:   family history includes Alcohol abuse in his father and mother; Asthma in his mother; Liver cancer in his paternal grandmother; Prostate cancer in his father.    Review of Systems: Review of Systems  Constitutional: Negative.   HENT: Negative.    Respiratory:  Positive for shortness of breath.   Cardiovascular:  Positive for chest pain.  Gastrointestinal: Negative.   Musculoskeletal: Negative.   Neurological: Negative.   Psychiatric/Behavioral: Negative.    All other systems reviewed and are negative.   PHYSICAL EXAM: VS:  BP 130/70 (BP Location: Left Arm, Patient Position: Sitting, Cuff Size: Normal)   Pulse (!) 47   Ht 5' 5.75 (1.67 m)   Wt 175 lb 8 oz (79.6 kg)   SpO2 97%   BMI 28.54 kg/m  , BMI Body mass index is 28.54 kg/m. Constitutional:  oriented to person, place, and time. No distress.  HENT:  Head: Grossly normal Eyes:  no discharge. No scleral icterus.  Neck: No JVD, no carotid bruits  Cardiovascular: Regular rate and rhythm, no murmurs appreciated Pulmonary/Chest: Clear to auscultation bilaterally, no wheezes or rails Abdominal: Soft.  no distension.  no tenderness.   Musculoskeletal: Normal range of motion Neurological:  normal muscle tone. Coordination normal. No atrophy Skin: Skin warm and dry Psychiatric: normal affect, pleasant  Recent Labs: 06/15/2024: Hemoglobin 14.3; Platelets 235.0; TSH 2.73 10/16/2024: ALT 25; BUN 11; Creatinine, Ser 0.80; Potassium 3.6; Sodium 141    Lipid Panel Lab Results  Component Value Date   CHOL 152 06/15/2024   HDL 28.40 (L) 06/15/2024   LDLCALC 64 06/15/2024   TRIG 299.0 (H) 06/15/2024    Wt Readings from Last 3 Encounters:  10/27/24 175 lb 8 oz (79.6 kg)  10/16/24 177 lb (80.3 kg)  08/21/24 170 lb (77.1 kg)     ASSESSMENT AND PLAN:  Bicuspid aortic valve -  Aortic valve insufficiency, Bioprosthetic valve placed  surgically We have ordered echocardiogram to reevaluate ejection fraction and aortic valve given new symptoms of worsening shortness of breath and chest tightness  Cardiomyopathy Prior history of cardiac arrest post surgery, low ejection fraction at that time Ejection fraction normalized on study March 2022 Repeat echocardiogram ordered given symptoms of shortness of breath, chest tightness, history of AVR  Ascending aorta dilatation (HCC)  Bentall aortic root replacement Repeat echo ordered  CAD with stable angina History of Severe native coronary artery disease Myoview  ordered given some chest tightness and shortness of breath on exertion  Hyperlipidemia Total cholesterol 150, could consider adding Zetia 10 mg daily to achieve goal LDL less than 55  Paroxysmal atrial fibrillation  Maintaining normal sinus rhythm  Orders Placed This Encounter  Procedures   EKG 12-Lead     Signed, Velinda Lunger, M.D., Ph.D. 10/27/2024  [facility] [place], [place] [phone]

## 2024-10-27 ENCOUNTER — Ambulatory Visit: Attending: Cardiovascular Disease | Admitting: Cardiovascular Disease

## 2024-10-27 ENCOUNTER — Encounter: Payer: Self-pay | Admitting: Cardiovascular Disease

## 2024-10-27 VITALS — BP 130/70 | HR 47 | Ht 65.75 in | Wt 175.5 lb

## 2024-10-27 DIAGNOSIS — Z951 Presence of aortocoronary bypass graft: Secondary | ICD-10-CM | POA: Diagnosis present

## 2024-10-27 DIAGNOSIS — Z953 Presence of xenogenic heart valve: Secondary | ICD-10-CM | POA: Insufficient documentation

## 2024-10-27 DIAGNOSIS — I351 Nonrheumatic aortic (valve) insufficiency: Secondary | ICD-10-CM | POA: Insufficient documentation

## 2024-10-27 DIAGNOSIS — I25118 Atherosclerotic heart disease of native coronary artery with other forms of angina pectoris: Secondary | ICD-10-CM | POA: Insufficient documentation

## 2024-10-27 DIAGNOSIS — I480 Paroxysmal atrial fibrillation: Secondary | ICD-10-CM | POA: Diagnosis not present

## 2024-10-27 DIAGNOSIS — Q2381 Bicuspid aortic valve: Secondary | ICD-10-CM | POA: Insufficient documentation

## 2024-10-27 DIAGNOSIS — I209 Angina pectoris, unspecified: Secondary | ICD-10-CM | POA: Diagnosis present

## 2024-10-27 DIAGNOSIS — I724 Aneurysm of artery of lower extremity: Secondary | ICD-10-CM | POA: Diagnosis present

## 2024-10-27 DIAGNOSIS — I7121 Aneurysm of the ascending aorta, without rupture: Secondary | ICD-10-CM | POA: Diagnosis not present

## 2024-10-27 NOTE — Patient Instructions (Addendum)
 Medication Instructions:   No changes  If you need a refill on your cardiac medications before your next appointment, please call your pharmacy.   Lab work: No new labs needed  Testing/Procedures:  Your physician has requested that you have an echocardiogram. Echocardiography is a painless test that uses sound waves to create images of your heart. It provides your doctor with information about the size and shape of your heart and how well your heart's chambers and valves are working.   You may receive an ultrasound enhancing agent through an IV if needed to better visualize your heart during the echo. This procedure takes approximately one hour.  There are no restrictions for this procedure.  This will take place at [address] [facility]) [address], [place] [address]  Please note: We ask at that you not bring children with you during ultrasound (echo/ vascular) testing. Due to room size and safety concerns, children are not allowed in the ultrasound rooms during exams. Our front office staff cannot provide observation of children in our lobby area while testing is being conducted. An adult accompanying a patient to their appointment will only be allowed in the ultrasound room at the discretion of the ultrasound technician under special circumstances. We apologize for any inconvenience.   Your provider has ordered a Lexiscan / Exercise Myoview  Stress test. This will take place at [facility]. Please report to the [facility] medical mall entrance. The volunteers at the first desk will direct you where to go.  [facility] MYOVIEW   Your provider has ordered a Stress Test with nuclear imaging. The purpose of this test is to evaluate the blood supply to your heart muscle. This procedure is referred to as a Non-Invasive Stress Test. This is because other than having an IV started in your vein, nothing is inserted or invades your body. Cardiac stress tests are done to find areas of poor blood flow to  the heart by determining the extent of coronary artery disease (CAD). Some patients exercise on a treadmill, which naturally increases the blood flow to your heart, while others who are unable to walk on a treadmill due to physical limitations will have a pharmacologic/chemical stress agent called Lexiscan  . This medicine will mimic walking on a treadmill by temporarily increasing your coronary blood flow.   Please note: these test may take anywhere between 2-4 hours to complete  How to prepare for your Myoview  test:  Nothing to eat for 6 hours prior to the test No caffeine for 24 hours prior to test No smoking 24 hours prior to test. Your medication may be taken with water.  If your doctor stopped a medication because of this test, do not take that medication. Ladies, please do not wear dresses.  Skirts or pants are appropriate. Please wear a short sleeve shirt. No perfume, cologne or lotion. Wear comfortable walking shoes. No heels!   PLEASE NOTIFY THE OFFICE AT LEAST 24 HOURS IN ADVANCE IF YOU ARE UNABLE TO KEEP YOUR APPOINTMENT.  [phone] AND  PLEASE NOTIFY NUCLEAR MEDICINE AT [facility] AT LEAST 24 HOURS IN ADVANCE IF YOU ARE UNABLE TO KEEP YOUR APPOINTMENT. [phone]   Follow-Up: At [facility], you and your health needs are our priority.  As part of our continuing mission to provide you with exceptional heart care, we have created designated Provider Care Teams.  These Care Teams include your primary Cardiologist (physician) and Advanced Practice Providers (APPs -  Physician Assistants and Nurse Practitioners) who all work together to provide  you with the care you need, when you need it.  You will need a follow up appointment in 12 months  Providers on your designated Care Team:   Lonni Meager, NP Bernardino Bring, PA-C Cadence [place], [place]  COVID-19 Vaccine Information can be found at: [email or web address] For questions  related to vaccine distribution or appointments, please email [email or web address] or call [phone].

## 2024-11-03 ENCOUNTER — Other Ambulatory Visit: Payer: Self-pay | Admitting: Cardiology

## 2024-11-03 ENCOUNTER — Inpatient Hospital Stay
Admission: RE | Admit: 2024-11-03 | Discharge: 2024-11-03 | Attending: Cardiovascular Disease | Admitting: Cardiovascular Disease

## 2024-11-03 DIAGNOSIS — I209 Angina pectoris, unspecified: Secondary | ICD-10-CM

## 2024-11-03 LAB — NM MYOCAR MULTI W/SPECT W/WALL MOTION / EF
LV dias vol: 109 mL (ref 62–150)
LV sys vol: 61 mL (ref 4.2–5.8)
MPHR: 152 {beats}/min
Nuc Stress EF: 44 %
Peak HR: 68 {beats}/min
Percent HR: 44 %
Rest HR: 43 {beats}/min
Rest Nuclear Isotope Dose: 11 mCi
SDS: 3
SRS: 8
SSS: 9
Stress Nuclear Isotope Dose: 32.6 mCi
TID: 1.06

## 2024-11-03 MED ORDER — REGADENOSON 0.4 MG/5ML IV SOLN
0.4000 mg | Freq: Once | INTRAVENOUS | Status: AC
  Administered 2024-11-03: 0.4 mg via INTRAVENOUS

## 2024-11-03 MED ORDER — TECHNETIUM TC 99M TETROFOSMIN IV KIT
32.6400 | PACK | Freq: Once | INTRAVENOUS | Status: AC | PRN
  Administered 2024-11-03: 32.64 via INTRAVENOUS

## 2024-11-03 MED ORDER — TECHNETIUM TC 99M TETROFOSMIN IV KIT
11.0000 | PACK | Freq: Once | INTRAVENOUS | Status: AC | PRN
  Administered 2024-11-03: 11 via INTRAVENOUS

## 2024-11-03 NOTE — Progress Notes (Unsigned)
     Previn C Fjeld presented for a Lexiscan  nuclear stress test today.  I Tara Rud, NP, provided direct supervision and was present during the stress portion of the study today, which was completed without significant symptoms, immediate complications, or acute ST/T changes on ECG.  Stress imaging is pending at this time.  Preliminary ECG findings may be listed in the chart, but the stress test result will not be finalized until perfusion imaging is complete.  Soumya Colson, NP  11/03/2024, 9:54 AM

## 2024-11-04 ENCOUNTER — Ambulatory Visit: Payer: Self-pay | Admitting: Cardiovascular Disease

## 2024-11-09 NOTE — Progress Notes (Unsigned)
 Cardiology Office Note  Date:  11/10/2024   ID:  Jeffrey Tucker, Jeffrey Tucker [birth year], MRN [id number]  PCP:  Rilla Baller, MD   Chief Complaint  Patient presents with   Follow-up    Discuss stress test results.  Patient c/o chest pain & shortness of breath with over exertion.     HPI:  Jeffrey Tucker is a very pleasant 68 year-old gentleman with  Bicuspid aortic valve, ascending aorta dilation Severe aortic valve regurgitation, -Coronary artery disease with moderate to severe distal RCA and left circumflex disease -Aorta dilation 5 cm Echo 2020:  dilatation of the aortic root 4.9 cm and of the ascending aorta 4.4 cm, aortic arch 3.7 cm. bicuspid aortic valve.  biological Bentall aortic root replacement and coronary artery bypass grafting x2 on August 27, 2019 -September 16, 2019 cardiac arrest, intubated, taken to the OR, grafts were ligated in an emergency procedure for internal bleeding History of right femoral pseudoaneurysm s/p repair He presents for follow-up after aortic valve replacement aortic root replacement, CABG, readmission for graft ligation  Last seen by myself in clinic December 2025 Stress test performed for shortness of breath concerning for angina -Presents today to review stress test results showing ischemia Inferior wall ischemia noted Symptoms of shortness of breath on his daily walks concerning for angina have persisted  No PND orthopnea, no leg swelling  Remains on Lipitor  40 daily Total cholesterol 150  Getting ready for his busy season, he is an airline pilot, does his taxes and bookkeeping   echo 9/24, EF 55% bioprosthetic valve present in the aortic position. Procedure Date: 08/27/19. Aortic valve mean gradient measures 4.8 mmHg.   EKG personally reviewed by myself on todays visit EKG Interpretation Date/Time:  Tuesday November 10 2024 11:23:35 EST Ventricular Rate:  55 PR Interval:  206 QRS Duration:  96 QT Interval:  432 QTC Calculation: 413 R  Axis:   -30  Text Interpretation: Sinus bradycardia Left axis deviation Moderate voltage criteria for LVH, may be normal variant ( R in aVL , Cornell product ) When compared with ECG of 27-Oct-2024 14:50, No significant change was found Confirmed by Perla Lye [phone]) on 11/10/2024 11:29:23 AM   Numbers below on Lipitor  Lab Results  Component Value Date   CHOL 152 06/15/2024   HDL 28.40 (L) 06/15/2024   LDLCALC 64 06/15/2024   TRIG 299.0 (H) 06/15/2024   Prior cardiac history reviewed   biological Bentall aortic root replacement and coronary artery bypass grafting x2 on August 27, 2019 by Dr. German.   discharged from the hospital on the seventh postoperative day.  readmitted to the hospital September 16, 2019 with severe chest pain and tachycardia.  CT scan and transthoracic echocardiogram revealed slight increase in the amount of pericardial effusion and/or blood in the mediastinum.  brief episode of atrial fibrillation , treated with amiodarone . September 23, 2019 had a brief episode of hypotension which resolved without significant intervention.   developed bloody drainage from his sternal wound.   wife found him unresponsive.  She began CPR.   EMS was called and brought him directly to the emergency room where he was unresponsive on arrival  intubated and placed on mechanical ventilation. He was seen urgently by Dr Dusty and was taken urgently to the Operating Room for exploration and repair.ligation of grafts  CT scan to have a right femoral pseudoaneurysm and vascular surgical consultation was obtained.    pseudoaneurysm measures 2 x 1 cm with a short neck.  surgical repair  PMH:   has a past medical history of Allergy, Aortic root dilatation, Asthma, Bicuspid aortic valve, BPH (benign prostatic hypertrophy), CAD (coronary artery disease), GERD (gastroesophageal reflux disease), History of kidney stones, Hypertension, Hypertriglyceridemia, Hypothyroidism, Pericardial  tamponade (09/25/2019), Severe aortic regurgitation, and Wears glasses.  PSH:    Past Surgical History:  Procedure Laterality Date   BENTALL PROCEDURE N/A 08/27/2019   Procedure: BENTALL PROCEDURE with a 27 mm KONECT Resilia Aortic Valved Conduit and 30 mm x 30 cm Hemashield Platinum straight graft.;  Surgeon: German Bartlett PEDLAR, MD;  Location: [facility];  Service: Open Heart Surgery;  Laterality: N/A;   CARDIAC CATHETERIZATION  2006   [place]   CARDIOVASCULAR STRESS TEST  2006   [place]   CHOLECYSTECTOMY  2006   CORONARY ARTERY BYPASS GRAFT N/A 08/27/2019   Procedure: CORONARY ARTERY BYPASS GRAFTING (CABG) times 2 using right saphenous endoscopic vein harvesting to OM2 nad PDA.;  Surgeon: German Bartlett PEDLAR, MD;  Location: [facility] OR;  Service: Open Heart Surgery;  Laterality: N/A;   CYSTOSCOPY W/ URETERAL STENT PLACEMENT  05/24/2017   Procedure: left;  Surgeon: Chauncey Redell Agent, MD;  Location: [facility] ORS;  Service: Urology;;   CYSTOSCOPY W/ URETERAL STENT PLACEMENT Left 06/11/2017   Procedure: CYSTOSCOPY WITH STENT REPLACEMENT;  Surgeon: Penne Knee, MD;  Location: [facility] ORS;  Service: Urology;  Laterality: Left;   CYSTOSCOPY WITH BIOPSY Left 06/11/2017   Procedure: RENAL PELVIS TUMOR WITH BIOPSY WITH POSSIBLE LASER ABLATION;  Surgeon: Penne Knee, MD;  Location: [facility] ORS;  Service: Urology;  Laterality: Left;   CYSTOSCOPY WITH STENT PLACEMENT Left 05/24/2017   Procedure: , cystoscopy,left ureteral stent placement and left ureteroscopy attempted, retrograde pylogram;  Surgeon: Chauncey Redell Agent, MD;  Location: [facility] ORS;  Service: Urology;  Laterality: Left;   EYE SURGERY Bilateral    Cataract Extraction with IOL   FALSE ANEURYSM REPAIR  10/29/2019   REPAIR FALSE ANEURYSM RIGHT FEMORAL    FALSE ANEURYSM REPAIR Right 10/29/2019   Procedure: REPAIR FALSE ANEURYSM RIGHT FEMORAL;  Surgeon: Gretta Lonni PARAS, MD;  Location: [facility] OR;  Service: Vascular;  Laterality: Right;   HEMATOMA  EVACUATION N/A 09/25/2019   Procedure: Evacuation Hematoma - Mediastinal Ligation of saphenous vein graft x two with Cardiopulmonary Bypass;  Surgeon: Dusty Sudie DEL, MD;  Location: [facility] OR;  Service: Thoracic;  Laterality: N/A;   IR THORACENTESIS ASP PLEURAL SPACE W/IMG GUIDE  09/02/2019   RIGHT/LEFT HEART CATH AND CORONARY ANGIOGRAPHY N/A 08/21/2019   Procedure: RIGHT/LEFT HEART CATH AND CORONARY ANGIOGRAPHY;  Surgeon: Perla Evalene PARAS, MD;  Location: [facility] INVASIVE CV LAB;  Service: Cardiovascular;  Laterality: N/A;   STERNOTOMY N/A 09/25/2019   Procedure: REDO STERNOTOMY;  Surgeon: Dusty Sudie DEL, MD;  Location: [facility] OR;  Service: Thoracic;  Laterality: N/A;   TEE WITHOUT CARDIOVERSION N/A 08/27/2019   Procedure: TRANSESOPHAGEAL ECHOCARDIOGRAM (TEE);  Surgeon: German Bartlett PEDLAR, MD;  Location[facility] OR;  Service: Open Heart Surgery;  Laterality: N/A;   TEE WITHOUT CARDIOVERSION N/A 09/25/2019   Procedure: Transesophageal Echocardiogram (Tee);  Surgeon: Dusty Sudie DEL, MD;  Location: [facility] OR;  Service: Thoracic;  Laterality: N/A;   URETEROSCOPY WITH HOLMIUM LASER LITHOTRIPSY Left 06/11/2017   Procedure: URETEROSCOPY WITH HOLMIUM LASER LITHOTRIPSY;  Surgeon: Penne Knee, MD;  Location: [facility] ORS;  Service: Urology;  Laterality: Left;   VEIN LIGATION AND STRIPPING Bilateral 1997    Current Outpatient Medications  Medication Sig Dispense Refill   albuterol  (VENTOLIN  HFA) 108 (90 Base) MCG/ACT inhaler Inhale 2  puffs into the lungs 3 (three) times daily as needed for wheezing or shortness of breath. 18 g 1   aspirin  EC 81 MG EC tablet Take 1 tablet (81 mg total) by mouth daily. 30 tablet 0   atorvastatin  (LIPITOR ) 40 MG tablet TAKE 1 TABLET BY MOUTH DAILY 30 tablet 2   cyanocobalamin  (VITAMIN B12) 1000 MCG tablet Take 1 tablet (1,000 mcg total) by mouth every Monday, Wednesday, and Friday.     ezetimibe  (ZETIA ) 10 MG tablet Take 1 tablet (10 mg total) by mouth daily. 90 tablet 3    fluticasone -salmeterol (ADVAIR) 100-50 MCG/ACT AEPB Inhale 1 puff into the lungs 2 (two) times daily. 60 each 1   ketoconazole  (NIZORAL ) 2 % shampoo Massage into scalp 1-2 times per week, let sit several minutes before rinsing For seborrheic dermatitis 120 mL 11   levocetirizine (XYZAL) 5 MG tablet Take 5 mg by mouth every evening.     levothyroxine  (SYNTHROID ) 125 MCG tablet TAKE 1 TABLET BY MOUTH DAILY 90 tablet 3   mometasone  (ELOCON ) 0.1 % lotion APPLY TOPICALLY 2 TIMES A WEEK for seborrheic dermatitis 60 mL 8   montelukast  (SINGULAIR ) 10 MG tablet Take 10 mg by mouth at bedtime.     UNABLE TO FIND Med Name: Allergy Shots weekly     valACYclovir  (VALTREX ) 1000 MG tablet TAKE 2 TABLETS BY MOUTH FOR COLD SORE, THEN REPEAT 2 TABLETS BY MOUTH IN 12 HOURS 20 tablet 0   No current facility-administered medications for this visit.     Allergies:   Tetracycline hcl, Beta adrenergic blockers, Metoprolol  tartrate, and Amlodipine    Social History:  The patient  reports that he quit smoking about 45 years ago. His smoking use included cigarettes. He has been exposed to tobacco smoke. He has never used smokeless tobacco. He reports that he does not drink alcohol and does not use drugs.   Family History:   family history includes Alcohol abuse in his father and mother; Asthma in his mother; Liver cancer in his paternal grandmother; Prostate cancer in his father.    Review of Systems: Review of Systems  Constitutional: Negative.   HENT: Negative.    Respiratory:  Positive for shortness of breath.   Cardiovascular:  Positive for chest pain.  Gastrointestinal: Negative.   Musculoskeletal: Negative.   Neurological: Negative.   Psychiatric/Behavioral: Negative.    All other systems reviewed and are negative.   PHYSICAL EXAM: VS:  BP (!) 140/72 (BP Location: Left Arm, Patient Position: Sitting, Cuff Size: Normal)   Pulse (!) 55   Ht 5' 5.75 (1.67 m)   Wt 176 lb (79.8 kg)   SpO2 97%   BMI  28.62 kg/m  , BMI Body mass index is 28.62 kg/m. Constitutional:  oriented to person, place, and time. No distress.  HENT:  Head: Normocephalic and atraumatic.  Eyes:  no discharge. No scleral icterus.  Neck: Normal range of motion. Neck supple. No JVD present.  Cardiovascular: Normal rate, regular rhythm, normal heart sounds and intact distal pulses. Exam reveals no gallop and no friction rub. No edema 2/6 systolic ejection murmur appreciated right sternal border Pulmonary/Chest: Effort normal and breath sounds normal. No stridor. No respiratory distress.  no wheezes.  no rales.  no tenderness.  Abdominal: Soft.  no distension.  no tenderness.  Musculoskeletal: Normal range of motion.  no  tenderness or deformity.  Neurological:  normal muscle tone. Coordination normal. No atrophy Skin: Skin is warm and dry. No rash noted. not diaphoretic.  Psychiatric:  normal mood and affect. behavior is normal. Thought content normal.    Recent Labs: 06/15/2024: Hemoglobin 14.3; Platelets 235.0; TSH 2.73 10/16/2024: ALT 25; BUN 11; Creatinine, Ser 0.80; Potassium 3.6; Sodium 141    Lipid Panel Lab Results  Component Value Date   CHOL 152 06/15/2024   HDL 28.40 (L) 06/15/2024   LDLCALC 64 06/15/2024   TRIG 299.0 (H) 06/15/2024    Wt Readings from Last 3 Encounters:  11/10/24 176 lb (79.8 kg)  10/27/24 175 lb 8 oz (79.6 kg)  10/16/24 177 lb (80.3 kg)     ASSESSMENT AND PLAN:  Bicuspid aortic valve -  Aortic valve insufficiency, Bioprosthetic valve placed  Repeat echo pending November 30, 2024  Cardiomyopathy Prior history of cardiac arrest post surgery, low ejection fraction at that time Ejection fraction normalized on study March 2022 Repeat echo pending given worsening shortness of breath, chest tightness, AVR, positive stress test  Ascending aorta dilatation (HCC)  Bentall aortic root replacement Repeat echo pending  CAD with stable angina History of Severe native coronary  artery disease Myoview  discussed with him today showing inferior wall ischemia Given symptoms we have recommended cardiac catheterization with possible percutaneous intervention - He prefers to have this done in [place], he will call us  after he has done his research and let us  know when and with who he would like to have procedure done  Hyperlipidemia Total cholesterol 150,  New prescription for Zetia  10 mg daily to achieve goal LDL less than 55  Paroxysmal atrial fibrillation  Maintaining normal sinus rhythm  Orders Placed This Encounter  Procedures   Basic metabolic panel with GFR   CBC   EKG 12-Lead     Signed, Velinda Lunger, M.D., Ph.D. 11/10/2024  [facility] [place], [place] [phone]

## 2024-11-10 ENCOUNTER — Ambulatory Visit: Attending: Cardiovascular Disease | Admitting: Cardiovascular Disease

## 2024-11-10 ENCOUNTER — Encounter: Payer: Self-pay | Admitting: Cardiovascular Disease

## 2024-11-10 VITALS — BP 140/72 | HR 55 | Ht 65.75 in | Wt 176.0 lb

## 2024-11-10 DIAGNOSIS — I351 Nonrheumatic aortic (valve) insufficiency: Secondary | ICD-10-CM | POA: Insufficient documentation

## 2024-11-10 DIAGNOSIS — I25118 Atherosclerotic heart disease of native coronary artery with other forms of angina pectoris: Secondary | ICD-10-CM

## 2024-11-10 DIAGNOSIS — I724 Aneurysm of artery of lower extremity: Secondary | ICD-10-CM

## 2024-11-10 DIAGNOSIS — Z951 Presence of aortocoronary bypass graft: Secondary | ICD-10-CM | POA: Diagnosis present

## 2024-11-10 DIAGNOSIS — Z953 Presence of xenogenic heart valve: Secondary | ICD-10-CM | POA: Diagnosis present

## 2024-11-10 DIAGNOSIS — I7121 Aneurysm of the ascending aorta, without rupture: Secondary | ICD-10-CM

## 2024-11-10 DIAGNOSIS — I480 Paroxysmal atrial fibrillation: Secondary | ICD-10-CM

## 2024-11-10 DIAGNOSIS — Q2381 Bicuspid aortic valve: Secondary | ICD-10-CM | POA: Diagnosis present

## 2024-11-10 DIAGNOSIS — Z79899 Other long term (current) drug therapy: Secondary | ICD-10-CM

## 2024-11-10 MED ORDER — EZETIMIBE 10 MG PO TABS: 10.0000 mg | ORAL_TABLET | Freq: Every day | ORAL | 3 refills | Status: AC

## 2024-11-10 NOTE — Patient Instructions (Addendum)
 Call when you are ready to schedule cardiac cath  Medication Instructions:   Please start zetia  10 mg daily  If you need a refill on your cardiac medications before your next appointment, please call your pharmacy.   Lab work:  Your provider would like for you to return to have the following labs drawn: BMP and CBC.   Please go to [facility] [address] [address], [place] [address] You do not need an appointment.  They are open from 8 am- 4:30 pm.  Lunch from 1:00 pm- 2:00 pm You will not need to be fasting.   Testing/Procedures:  Jeffrey Tucker. Jeffrey Tucker [address] [place], [address] [place] [address] Dept: [phone] Loc: [phone]  Jeffrey Tucker  11/10/2024    1. Please arrive at the [facility] (Main Entrance A) at [facility]: [address] [place], [place] [address].  Free valet parking service is available. You will check in at ADMITTING. The support person will be asked to wait in the waiting room.  It is OK to have someone drop you off and come back when you are ready to be discharged.    Special note: Every effort is made to have your procedure done on time. Please understand that emergencies sometimes delay scheduled procedures.  2. Diet: NPO: Nothing to eat OR drink after midnight. (For TEE and Cath the same day)   3. Hydration: You need to be well hydrated before your procedure.  You may drink approved liquids (see below) until 2 hours before the procedure, with 16 oz of water as your last intake.   List of approved liquids water, clear juice, clear tea, black coffee, fruit juices, non-citric and without pulp, carbonated beverages, Gatorade, Kool -Aid, plain Jello-O and plain ice popsicles.   5. Medication instructions in preparation for your procedure:   Contrast Allergy: No  On the morning of your procedure, take  your Aspirin  81 mg and any morning medicines NOT listed above.  You may use sips of water.  6. Plan to go home the same day, you will only stay overnight if medically necessary. 7. Bring a current list of your medications and current insurance cards. 8. You MUST have a responsible person to drive you home. 9. Someone MUST be with you the first 24 hours after you arrive home or your discharge will be delayed. 10. Please wear clothes that are easy to get on and off and wear slip-on shoes.  Thank you for allowing us  to care for you!   -- [facility]   Follow-Up: At [facility], you and your health needs are our priority.  As part of our continuing mission to provide you with exceptional heart care, we have created designated Provider Care Teams.  These Care Teams include your primary Cardiologist (physician) and Advanced Practice Providers (APPs -  Physician Assistants and Nurse Practitioners) who all work together to provide you with the care you need, when you need it.  You will need a follow up appointment in 3 months  Providers on your designated Care Team:   Lonni Meager, NP Bernardino Bring, PA-C Jeffrey Tucker, [place]  COVID-19 Vaccine Information can be found at: [email or web address] For questions related to vaccine distribution or appointments, please email vaccine@ [email or web address] or call [phone].

## 2024-11-20 ENCOUNTER — Encounter: Payer: Self-pay | Admitting: Family Medicine

## 2024-11-23 ENCOUNTER — Encounter: Payer: Self-pay | Admitting: Cardiovascular Disease

## 2024-11-24 ENCOUNTER — Telehealth: Payer: Self-pay | Admitting: Family Medicine

## 2024-11-24 ENCOUNTER — Ambulatory Visit: Admitting: Cardiovascular Disease

## 2024-11-24 MED ORDER — VALACYCLOVIR HCL 1 G PO TABS: 2000.0000 mg | ORAL_TABLET | Freq: Two times a day (BID) | ORAL | 1 refills | Status: AC

## 2024-11-24 NOTE — Telephone Encounter (Signed)
Plz notify this was refilled.  

## 2024-11-24 NOTE — Telephone Encounter (Signed)
 Copied from CRM [id number]. Topic: Clinical - Medication Refill >> Nov 24, 2024  2:54 PM China J wrote: Medication:  valACYclovir  (VALTREX ) 1000 MG tablet  Has the patient contacted their pharmacy? Yes (Agent: If no, request that the patient contact the pharmacy for the refill. If patient does not wish to contact the pharmacy document the reason why and proceed with request.) (Agent: If yes, when and what did the pharmacy advise?)  This is the patient's preferred pharmacy:  [facility] [id number] [place], [place] - [address] [place] [address] Phone: [phone] Fax: [phone]  Is this the correct pharmacy for this prescription? Yes If no, delete pharmacy and type the correct one.   Has the prescription been filled recently? No  Is the patient out of the medication? Yes  Has the patient been seen for an appointment in the last year OR does the patient have an upcoming appointment? Yes  Can we respond through MyChart? Yes  Agent: Please be advised that Rx refills may take up to 3 business days. We ask that you follow-up with your pharmacy.

## 2024-11-24 NOTE — Telephone Encounter (Signed)
 Copied from CRM #[id number]. Topic: General - Other >> Nov 24, 2024  2:56 PM China J wrote: Reason for CRM: The patient would like to let Clotilda know that he will be holding off on booking an appointment to go over weight loss. He will schedule around April.

## 2024-11-25 NOTE — Telephone Encounter (Signed)
 Called patient reviewed all information and repeated back to me. Will call if any questions.  ? ?

## 2024-11-30 ENCOUNTER — Ambulatory Visit: Attending: Cardiovascular Disease

## 2024-11-30 ENCOUNTER — Telehealth: Payer: Self-pay | Admitting: Cardiovascular Disease

## 2024-11-30 DIAGNOSIS — Z953 Presence of xenogenic heart valve: Secondary | ICD-10-CM | POA: Insufficient documentation

## 2024-11-30 DIAGNOSIS — Q2381 Bicuspid aortic valve: Secondary | ICD-10-CM | POA: Insufficient documentation

## 2024-11-30 LAB — ECHOCARDIOGRAM COMPLETE
AR max vel: 2.82 cm2
AV Area VTI: 2.54 cm2
AV Area mean vel: 2.82 cm2
AV Mean grad: 6 mmHg
AV Peak grad: 11.2 mmHg
Ao pk vel: 1.67 m/s
Area-P 1/2: 3.12 cm2
S' Lateral: 3.15 cm

## 2024-11-30 NOTE — Telephone Encounter (Signed)
 Patient returned RN's call regarding catheterization. Jeffrey Tucker

## 2024-11-30 NOTE — Telephone Encounter (Signed)
 Called patient and left message for call back.

## 2024-11-30 NOTE — Telephone Encounter (Signed)
 Patient scheduled for left heart catheterization on 12/09/24. Patient given verbal instructions. Patient verbalizes understanding.

## 2024-12-01 ENCOUNTER — Encounter: Payer: Self-pay | Admitting: Emergency Medicine

## 2024-12-01 ENCOUNTER — Ambulatory Visit: Payer: Self-pay | Admitting: Cardiovascular Disease

## 2024-12-01 LAB — BASIC METABOLIC PANEL WITH GFR
BUN/Creatinine Ratio: 18 (ref 10–24)
BUN: 19 mg/dL (ref 8–27)
CO2: 20 mmol/L (ref 20–29)
Calcium: 9.4 mg/dL (ref 8.6–10.2)
Chloride: 104 mmol/L (ref 96–106)
Creatinine, Ser: 1.04 mg/dL (ref 0.76–1.27)
Glucose: 102 mg/dL — ABNORMAL HIGH (ref 70–99)
Potassium: 4.2 mmol/L (ref 3.5–5.2)
Sodium: 142 mmol/L (ref 134–144)
eGFR: 78 mL/min/1.73

## 2024-12-01 LAB — CBC
Hematocrit: 45.9 % (ref 37.5–51.0)
Hemoglobin: 14.6 g/dL (ref 13.0–17.7)
MCH: 30.9 pg (ref 26.6–33.0)
MCHC: 31.8 g/dL (ref 31.5–35.7)
MCV: 97 fL (ref 79–97)
Platelets: 265 x10E3/uL (ref 150–450)
RBC: 4.73 x10E6/uL (ref 4.14–5.80)
RDW: 13.3 % (ref 11.6–15.4)
WBC: 5.9 x10E3/uL (ref 3.4–10.8)

## 2024-12-03 ENCOUNTER — Telehealth: Payer: Self-pay | Admitting: Emergency Medicine

## 2024-12-03 NOTE — Telephone Encounter (Signed)
 Spoke with patient. Patient heart catheterization rescheduled for 12/18/23 with Dr. Wonda. Patient given updated instructions. Patient verbalizes understanding.

## 2024-12-03 NOTE — Telephone Encounter (Signed)
 Called patient and left message for call back.  Patient is currently scheduled for a left heart catheterization for 12/09/24 with Dr. Ladona. Patient had requested his heart catheterization to be with Dr. Wonda. Calling to clarify with patient if he would like to keep his current heart catheterization schedule or to reschedule for a different date with Dr. Wonda. If patient reschedules for after 12/10/24 he will need to be seen in clinic prior to heart catheterization.

## 2024-12-04 ENCOUNTER — Other Ambulatory Visit: Payer: Self-pay | Admitting: Cardiovascular Disease

## 2024-12-04 DIAGNOSIS — I200 Unstable angina: Secondary | ICD-10-CM

## 2024-12-04 NOTE — H&P (View-Only) (Signed)
 "  Cardiology Clinic Note   Date: 12/07/2024 ID: Jeffrey Tucker, DOB 23-May-1956, MRN [id number]  Primary Cardiologist:  Evalene Lunger, MD  Chief Complaint   Jeffrey Tucker is a 69 y.o. male who presents to the clinic today for follow-up of heart catheterization.  Patient Profile   Jeffrey Tucker is followed by Dr. Gollan for the history outlined below.      Past medical history significant for: CAD. R/LHC 08/21/2019: Proximal mid LCx 80%.  Mid LCx 80%.  Distal RCA 70%.  RPDA 70%. CABG x 2 08/27/2019: SVG to PDA, SVG to OM branch of LCx. Mediastinal hematoma evacuation, ligation of SVG grafts x 2 09/25/2019. Nuclear stress test 11/03/2024: Intermediate risk/abnormal pharmacological myocardial perfusion stress test.  There is a moderate-sized, mild in severity nearly completely reversible inferior wall defect consistent with ischemia.  Small area of scar.  Ischemia cannot be excluded.  LV systolic function mildly reduced 44%.  There is evidence of prior CABG as well as AVR aortic root repair.  Compared to prior study January 2021 inferior defect is new. Pulmonary hypertension. R/LHC 08/21/2019: Hemodynamic findings consistent with moderate pulmonary hypertension. Bicuspid aortic valve/Thoracic aortic aneurysm without rupture. AVR/aortic root replacement 08/27/2019: 27 AS connect Resilia bioprosthetic valve.  Distal hemiarch reconstruction with 30 mm Dacron graft. Echo 11/30/2024: EF 60 to 65%.  No RWMA.  Moderate LVH.  Grade I DD.  Normal global strain.  Normal RV size/function.  Increased right ventricular wall thickness.  Normal PA pressure, RVSP 70.1 mmHg.  No AI/AS mean gradient 6 mmHg. Hyperlipidemia. Lipid panel 06/15/2024: LDL 64, HDL 28, TG 299, total 152. Asthma. Hypothyroidism. TIA.  In summary, patient was noted to have a murmur on exam 2012 with subsequent echo showing EF 50 to 55%, mild LVH, normal diastolic function, images concerning for bicuspid aortic valve with severe  regurgitation, mild to moderately dilated aortic root, mild to moderately dilated ascending aorta, mild MR.  Over the years he was monitored with periodic echoes.  Echo in July 2020 demonstrated progression of his structural cardiovascular disease with EF of 50 to 55% (prior echo in 2018 demonstrated EF > 55%), moderate to severe aortic valve regurgitation, dilated aortic root 4.9 cm, ascending aortic measuring 4.4 cm, aortic arch measuring 3.7 cm.  Given these results, he underwent CTA of the aorta in August 2020 which demonstrated aneurysmal disease of the thoracic aorta with aortic root measuring up to 5 cm at the level of the sinuses of Valsalva, ascending thoracic aorta measuring 4.3 cm in its greatest diameter.  CTA also demonstrated coronary artery calcification in LAD, LCx, and RCA distributions.    R/LHC was performed in September 2020 and demonstrated two-vessel CAD hemodynamic findings consistent with moderate pulmonary hypertension.  He underwent consultation with CTS with subsequent CABG x 2 along with AVR/aortic root replacement on 08/27/2019.  Postop course was notable for expected acute blood loss anemia requiring 2 units PRBC, volume overload requiring diuresis, subsequent desaturation with follow-up chest x-ray showed bilateral pleural effusions L>R requiring left thoracentesis with 6 mL of dark fluid removed.  Following this, patient reported left back and shoulder pain with follow-up chest x-ray showing no pneumothorax and basilar density on the left which was felt to possibly be related to reexpansion or edema.  Pain persisted despite morphine  for pain control leading to CT of the chest which showed bilateral pleural effusions, atelectasis, stable cardiomegaly, complex fluid throughout proximal large felt to likely reflect a small amount of hemorrhage which is  felt to be postsurgical.  He was seen by CT surgery from a surgical perspective.  Postop x-ray demonstrated stable cardiac without  pulmonary improved bibasilar atelectasis decreased right pleural effusion slightly increased small left pleural effusion.    Patient was seen by cardiology on 09/15/2019 for a several day history of involuntary abdominal spasms followed by cyclic dry cough pain with associated significant chest discomfort.  Symptoms worse when attempting to lay supine and improved with sitting up with both feet flat on the ground with episodes lasting approximately 60 seconds.  Patient denied issues from surgical site.  He noted mild lower extremity edema that was improving.  Patient shared he had developed a fever with the prior 24 hours with temperature of 102 on the day prior to 100.2 the morning of this visit post Tylenol .  He reported nasal congestion he attributed to that has been allergies.  It was felt spasmatic cough/could be exacerbated by recent starting of metoprolol .  After discussion with CT surgery it was agreed to discontinue Toprol , start trial of Tessalon  Perles, and swab patient for COVID.    Patient presented to the ED on 09/16/2019 for shortness of breath and central chest pain.  He also reported right lower leg increased edema.  Patient was admitted.  Inflammatory markers were elevated questionably related to surgery versus post cardiotomy syndrome.  Patient was started on colchicine .  Patient had 2 episodes of asymptomatic bradycardia with heart rate in the upper 40s.  Patient also complained of loose stool.  He was negative for C. difficile.  Patient was discharged on 09/24/2019.  Patient presented to the ED on 09/25/2019 via EMS unresponsive with STEMI on EKG.  Patient's wife reported she found unresponsive and began CPR.  EMS reported patient obtunded with episodes of apnea.  CT surgery requested emergent CTA.  Patient was intubated and placed on mechanical ventilation.  CTA demonstrated changes consistent with active aortic extravasation with enlarging pericardial effusion, extravasation occurs  adjacent to the origin of the reimplanted coronary artery which is somewhat diminutive on current exam, it was previously coursed along the posterior interventricular septum and may represent breakdown of the reimplantation site, right upper lobe pulmonary arterial emboli new from prior study, left lower lobe consolidation with smaller areas of atelectasis in the right lower lobe and left upper lobe, pseudoaneurysm arising from the right common femoral artery that is new from prior exam from March 2020.  Patient was taken to the OR emergently for evacuation of mediastinal hematoma, ligation of SVG grafts x 2 and emergent redo median sternotomy.  Patient complained of right groin pain and underwent vascular ultrasound of the lower extremity and was found to have 2.4 x 1.7 cm area off of the right common femoral artery with ultrasound characteristics of a pseudoaneurysm.  It was felt aneurysm was most likely present since catheterization 6 weeks prior reducing likelihood of spontaneous closure.  Patient subsequently underwent open repair of right common femoral artery pseudoaneurysm with vascular surgery on 10/29/2019.  Patient was evaluated in the clinic on 10/27/2024 for chest discomfort and shortness of breath when walking at a fast pace x 2 months.  He reported a slow onset of less energy, more dyspnea, and chest discomfort.  He underwent nuclear stress test which was an abnormal, intermediate risk study showing a new inferior defect nearly completely reversible.  Patient was last seen in the office by Dr. Gollan on 11/10/2024 for follow-up after testing.  Patient reported dyspnea on his daily walks have continued.  Patient stated he preferred having LHC performed at [facility] and wanted to research who he would like to do the procedure.     History of Present Illness    Today, patient is here alone.  He continues to experience when walking fast.  He has also had decreased energy and activity tolerance.   He states he does not necessarily have any chest pain or discomfort but more of an awareness across his chest that he felt was an indicator something was wrong. He denies lower extremity edema or palpitations. He is scheduled for LHC on 1/22 with Dr. Wonda in [place].     ROS: All other systems reviewed and are otherwise negative except as noted in History of Present Illness.  EKGs/Labs Reviewed    EKG Interpretation Date/Time:  Monday December 07 2024 08:21:30 EST Ventricular Rate:  62 PR Interval:  182 QRS Duration:  98 QT Interval:  420 QTC Calculation: 426 R Axis:   -9  Text Interpretation: Normal sinus rhythm Possible Left atrial enlargement Minimal voltage criteria for LVH, may be normal variant ( Cornell product ) When compared with ECG of 10-Nov-2024 11:23, No significant change was found Confirmed by Loistine Sober [phone]) on 12/07/2024 8:41:05 AM   10/16/2024: ALT 25; AST 21 11/30/2024: BUN 19; Creatinine, Ser 1.04; Potassium 4.2; Sodium 142   11/30/2024: Hemoglobin 14.6; WBC 5.9   06/15/2024: TSH 2.73    Physical Exam    VS:  BP 130/82 (BP Location: Left Arm, Patient Position: Sitting, Cuff Size: Normal)   Pulse 62   Ht 5' 5.75 (1.67 m)   Wt 177 lb 9.6 oz (80.6 kg)   SpO2 98%   BMI 28.88 kg/m  , BMI Body mass index is 28.88 kg/m.  GEN: Well nourished, well developed, in no acute distress. Neck: No JVD or carotid bruits. Cardiac:  RRR. 2/6 systolic murmur. No rubs or gallops.   Respiratory:  Respirations regular and unlabored. Clear to auscultation without rales, wheezing or rhonchi. GI: Soft, nontender, nondistended. Extremities: Radials/DP/PT 2+ and equal bilaterally. No clubbing or cyanosis. No edema   Skin: Warm and dry, no rash. Neuro: Strength intact.  Assessment & Plan   CAD S/p CABG x 28 August 2019 with subsequent evacuation of mediastinal hematoma, ligation of SVG grafts x 2.  Patient with recent symptoms of dyspnea on daily walks with mild  chest discomfort and decreased energy.  Nuclear stress test 11/03/2024 was an intermittent risk/abnormal study demonstrating a moderate-sized, mild in severity nearly completely reversible inferior wall defect consistent with ischemia.  Patient requested heart catheterization to be performed by Dr. Wonda at [facility]. He reports continued symptoms of dyspnea with walking and decreased energy and activity tolerance. He does not have frank chest pain or discomfort but more of an awareness that seems different than previous. EKG without acute changes. Labs are up to date.  - Continue aspirin , atorvastatin , ezetimibe . - LHC scheduled for 12/17/2024 with Dr. Wonda at [facility].  Bicuspid aortic valve/thoracic aortic aneurysm S/p AVR and aortic root replacement 08/27/2019.  Echo December 2025 demonstrated normal LV/RV function, no AI/AS, mean gradient 6 mmHg.  Patient denies lightheadedness, dizziness, presyncope or syncope. 2/6 systolic murmur on exam today.  - Repeat echo December 2026. - SBE prophylaxis required.  Hyperlipidemia LDL 64 July 2025, at goal. - Continue atorvastatin  and ezetimibe .  Disposition: LHC scheduled with Dr. Wonda at [facility] 12/17/2024. Return in 3 weeks after cath or sooner as needed.   Informed Consent  Shared Decision Making/Informed Consent The risks [stroke (1 in 1000), death (1 in 1000), kidney failure [usually temporary] (1 in 500), bleeding (1 in 200), allergic reaction [possibly serious] (1 in 200)], benefits (diagnostic support and management of coronary artery disease) and alternatives of a cardiac catheterization were discussed in detail with Mr. Saravia and he is willing to proceed.             Signed, Barnie HERO. Payton Moder, DNP, NP-C  "

## 2024-12-04 NOTE — Progress Notes (Unsigned)
 "  Cardiology Clinic Note   Date: 12/04/2024 ID: Jeffrey Tucker, DOB 1955-12-16, MRN [id number]  Primary Cardiologist:  Evalene Lunger, MD  Chief Complaint   Jeffrey Tucker is a 69 y.o. male who presents to the clinic today for ***  Patient Profile   Jeffrey Tucker is followed by Dr. Gollan for the history outlined below.      Past medical history significant for: CAD. R/LHC 08/21/2019: Proximal mid LCx 80%.  Mid LCx 80%.  Distal RCA 70%.  RPDA 70%. CABG x 2 08/27/2019: SVG to PDA, SVG to OM branch of LCx. Mediastinal hematoma evacuation, ligation of SVG grafts x 2 09/25/2019. Nuclear stress test 11/03/2024: Intermediate risk/abnormal pharmacological myocardial perfusion stress test.  There is a moderate-sized, mild in severity nearly completely reversible inferior wall defect consistent with ischemia.  Small area of scar.  Ischemia cannot be excluded.  LV systolic function mildly reduced 44%.  There is evidence of prior CABG as well as AVR aortic root repair.  Compared to prior study January 2021 inferior defect is new. Pulmonary hypertension. R/LHC 08/21/2019: Hemodynamic findings consistent with moderate pulmonary hypertension. Bicuspid aortic valve/Thoracic aortic aneurysm without rupture. AVR/aortic root replacement 08/27/2019: 27 AS connect Resilia bioprosthetic valve.  Distal hemiarch reconstruction with 30 mm Dacron graft. Echo 11/30/2024: EF 60 to 65%.  No RWMA.  Moderate LVH.  Grade I DD.  Normal global strain.  Normal RV size/function.  Increased right ventricular wall thickness.  Normal PA pressure, RVSP 70.1 mmHg.  No AI/AS mean gradient 6 mmHg. Hyperlipidemia. Lipid panel 06/15/2024: LDL 64, HDL 28, TG 299, total 152. Asthma. Hypothyroidism. TIA.  In summary, patient was noted to have a purpura on exam 2012 with subsequent echo showing EF 50 to 55%, mild LVH, normal diastolic function, images concerning for bicuspid aortic valve with severe regurgitation, mild to moderately  dilated aortic root, mild to moderately dilated ascending aorta, mild MR.  Over the years he was monitored with periodic echoes.  Echo in July 2020 demonstrated progression of his structural cardiovascular disease with EF of 50 to 55% (prior echo in 2018 demonstrated EF > 55%), moderate to severe aortic valve regurgitation, dilated aortic root 4.9 cm, ascending aortic measuring 4.4 cm, aortic arch measuring 3.7 cm.  Given these results, he underwent CTA of the aorta in August 2020 which demonstrated aneurysmal disease of the thoracic aorta with aortic root measuring up to 5 cm at the level of the sinuses of Valsalva, ascending thoracic aorta measuring 4.3 cm in its greatest diameter.  CTA also demonstrated coronary artery calcification in LAD, LCx, and RCA distributions.    R/LHC was performed in September 2020 and demonstrated two-vessel CAD hemodynamic findings consistent with moderate pulmonary hypertension.  He underwent consultation with CTS with subsequent CABG x 2 along with AVR/aortic root replacement on 08/27/2019.  Postop course was notable for expected acute blood loss anemia requiring 2 units PRBC, volume overload requiring diuresis, subsequent desaturation with follow-up chest x-ray showed bilateral pleural effusions L>R requiring left thoracentesis with 6 mL of dark fluid removed.  Following this, patient reported left back and shoulder pain with follow-up chest x-ray showing no pneumothorax and basilar density on the left which was felt to possibly be related to reexpansion or edema.  Pain persisted despite morphine  for pain control leading to CT of the chest which showed bilateral pleural effusions, atelectasis, stable cardiomegaly, complex fluid throughout proximal large felt to likely reflect a small amount of hemorrhage which is felt to be  postsurgical.  He was seen by CT surgery from a surgical perspective.  Postop x-ray demonstrated stable cardiac without pulmonary improved bibasilar  atelectasis decreased right pleural effusion slightly increased small left pleural effusion.    Patient was seen by cardiology on 09/15/2019 for a several day history of involuntary abdominal spasms followed by cyclic dry cough pain with associated significant chest discomfort.  Symptoms worse when attempting to lay supine and improved with sitting up with both feet flat on the ground with episodes lasting approximately 60 seconds.  Patient denied issues from surgical site.  He noted mild lower extremity edema that was improving.  Patient shared he had developed a fever with the prior 24 hours with temperature of 102 on the day prior to 100.2 the morning of this visit post Tylenol .  He reported nasal congestion he attributed to that has been allergies.  It was felt spasmatic cough/could be exacerbated by recent starting of metoprolol .  After discussion with CT surgery it was agreed to discontinue Toprol , start trial of Tessalon  Perles, and swab patient for COVID.    Patient presented to the ED on 09/16/2019 for shortness of breath and central chest pain.  He also reported right lower leg increased edema.  Patient was admitted.  Inflammatory markers were elevated questionably related to surgery versus post cardiotomy syndrome.  Patient was started on colchicine .  Patient had 2 episodes of asymptomatic bradycardia with heart rate in the upper 40s.  Patient also complained of loose stool.  He was negative for C. difficile.  Patient was discharged on 09/24/2019.  Patient presented to the ED on 09/25/2019 via EMS unresponsive with STEMI on EKG.  Patient's wife reported she found unresponsive and began CPR.  EMS reported patient obtunded with episodes of apnea.  CT surgery requested emergent CTA.  Patient was intubated and placed on mechanical ventilation.  CTA demonstrated changes consistent with active aortic extravasation with enlarging pericardial effusion, extravasation occurs adjacent to the origin of the  reimplanted coronary artery which is somewhat diminutive on current exam, it was previously coursed along the posterior interventricular septum and may represent breakdown of the reimplantation site, right upper lobe pulmonary arterial emboli new from prior study, left lower lobe consolidation with smaller areas of atelectasis in the right lower lobe and left upper lobe, pseudoaneurysm arising from the right common femoral artery that is new from prior exam from March 2020.  Patient was taken to the OR emergently for evacuation of mediastinal hematoma, ligation of SVG grafts x 2 and emergent redo median sternotomy.  Patient complained of right groin pain and underwent vascular ultrasound of the lower extremity and was found to have 2.4 x 1.7 cm area off of the right common femoral artery with ultrasound characteristics of a pseudoaneurysm.  It was felt aneurysm was most likely present since catheterization 6 weeks prior reducing likelihood of spontaneous closure.  Patient subsequently underwent open repair of right common femoral artery pseudoaneurysm with vascular surgery on 10/29/2019.  Patient was evaluated in the clinic on 10/27/2024 for chest discomfort and shortness of breath when walking at a fast pace x 2 months.  He reported a slow onset of less energy, more dyspnea, and chest discomfort.  He underwent nuclear stress test which was an abnormal, intermediate risk study showing a new inferior defect nearly completely reversible.  Patient was last seen in the office by Dr. Gollan on 11/10/2024 for follow-up after testing.  Patient reported dyspnea on his daily walks have continued.  Patient stated  he preferred having LHC performed at [facility] and wanted to research who he would like to do the procedure.     History of Present Illness    Today, patient ***  CAD S/p CABG x 28 August 2019 with subsequent evacuation of mediastinal hematoma, ligation of SVG grafts x 2.  Patient with recent symptoms of  dyspnea on daily walks with mild chest discomfort and decreased energy.  Nuclear stress test 11/03/2024 was an intermittent risk/abnormal study demonstrating a moderate-sized, mild in severity nearly completely reversible inferior wall defect consistent with ischemia.  Patient requested heart catheterization to be performed by Dr. Wonda at [facility].  He reports***EKG*** - Continue aspirin , atorvastatin , ezetimibe . - LHC scheduled for 12/17/2024 with Dr. Wonda at [facility].  Bicuspid aortic valve/thoracic aortic aneurysm S/p AVR and aortic root replacement 08/27/2019.  Echo December 2025 demonstrated normal LV/RV function, no AI/AS, mean gradient 6 mmHg.  Patient*** - Repeat echo December 2026. - SBE prophylaxis required.  Hyperlipidemia LDL 64 July 2025, at goal. - Continue atorvastatin  and ezetimibe .  ROS: All other systems reviewed and are otherwise negative except as noted in History of Present Illness.  EKGs/Labs Reviewed        10/16/2024: ALT 25; AST 21 11/30/2024: BUN 19; Creatinine, Ser 1.04; Potassium 4.2; Sodium 142   11/30/2024: Hemoglobin 14.6; WBC 5.9   06/15/2024: TSH 2.73   No results found for requested labs within last 365 days.  ***  Risk Assessment/Calculations    {Does this patient have ATRIAL FIBRILLATION?:[phone]} No BP recorded.  {Refresh Note OR Click here to enter BP  :1}***        Physical Exam    VS:  There were no vitals taken for this visit. , BMI There is no height or weight on file to calculate BMI.  GEN: Well nourished, well developed, in no acute distress. Neck: No JVD or carotid bruits. Cardiac: *** RRR. *** No murmur. No rubs or gallops.   Respiratory:  Respirations regular and unlabored. Clear to auscultation without rales, wheezing or rhonchi. GI: Soft, nontender, nondistended. Extremities: Radials/DP/PT 2+ and equal bilaterally. No clubbing or cyanosis. No edema ***  Skin: Warm and dry, no rash. Neuro: Strength intact.  Assessment  & Plan   ***  Disposition: ***     {Are you ordering a CV Procedure (e.g. stress test, cath, DCCV, TEE, etc)?   Press F2        :[id number]}   Signed, Barnie HERO. Abigail Teall, DNP, NP-C  "

## 2024-12-07 ENCOUNTER — Encounter: Payer: Self-pay | Admitting: Student

## 2024-12-07 ENCOUNTER — Ambulatory Visit: Attending: Student | Admitting: Student

## 2024-12-07 VITALS — BP 130/82 | HR 62 | Ht 65.75 in | Wt 177.6 lb

## 2024-12-07 DIAGNOSIS — E785 Hyperlipidemia, unspecified: Secondary | ICD-10-CM | POA: Diagnosis not present

## 2024-12-07 DIAGNOSIS — Z9889 Other specified postprocedural states: Secondary | ICD-10-CM | POA: Diagnosis not present

## 2024-12-07 DIAGNOSIS — Q2381 Bicuspid aortic valve: Secondary | ICD-10-CM | POA: Insufficient documentation

## 2024-12-07 DIAGNOSIS — I7121 Aneurysm of the ascending aorta, without rupture: Secondary | ICD-10-CM | POA: Insufficient documentation

## 2024-12-07 DIAGNOSIS — Z953 Presence of xenogenic heart valve: Secondary | ICD-10-CM | POA: Diagnosis not present

## 2024-12-07 DIAGNOSIS — I25110 Atherosclerotic heart disease of native coronary artery with unstable angina pectoris: Secondary | ICD-10-CM | POA: Insufficient documentation

## 2024-12-07 DIAGNOSIS — Z8679 Personal history of other diseases of the circulatory system: Secondary | ICD-10-CM | POA: Insufficient documentation

## 2024-12-07 NOTE — Patient Instructions (Signed)
 Medication Instructions:   Your physician recommends that you continue on your current medications as directed. Please refer to the Current Medication list given to you today.    *If you need a refill on your cardiac medications before your next appointment, please call your pharmacy*  Lab Work:  None ordered at this time   If you have labs (blood work) drawn today and your tests are completely normal, you will receive your results only by:  MyChart Message (if you have MyChart) OR  A paper copy in the mail If you have any lab test that is abnormal or we need to change your treatment, we will call you to review the results.  Testing/Procedures:  None ordered at this time   Referrals:  None ordered at this time   Follow-Up:  At [facility], you and your health needs are our priority.  As part of our continuing mission to provide you with exceptional heart care, our providers are all part of one team.  This team includes your primary Cardiologist (physician) and Advanced Practice Providers or APPs (Physician Assistants and Nurse Practitioners) who all work together to provide you with the care you need, when you need it.  Your next appointment:   4 week(s)  Provider:    Evalene Lunger, MD or [name], NP    We recommend signing up for the patient portal called MyChart.  Sign up information is provided on this After Visit Summary.  MyChart is used to connect with patients for Virtual Visits (Telemedicine).  Patients are able to view lab/test results, encounter notes, upcoming appointments, etc.  Non-urgent messages can be sent to your provider as well.   To learn more about what you can do with MyChart, go to [email or web address].

## 2024-12-09 ENCOUNTER — Ambulatory Visit (HOSPITAL_COMMUNITY): Admission: RE | Admit: 2024-12-09 | Source: Home / Self Care | Admitting: Cardiovascular Disease

## 2024-12-09 ENCOUNTER — Encounter (HOSPITAL_COMMUNITY): Admission: RE | Payer: Self-pay | Source: Home / Self Care

## 2024-12-09 SURGERY — LEFT HEART CATH AND CORS/GRAFTS ANGIOGRAPHY
Anesthesia: LOCAL

## 2024-12-15 ENCOUNTER — Telehealth: Payer: Self-pay | Admitting: *Deleted

## 2024-12-15 NOTE — Telephone Encounter (Signed)
 Cardiac Catheterization scheduled at [facility] for: Thursday December 17, 2024 10:30 AM Arrival time [facility] Main Entrance A at: 8:30 AM  Diet: -Nothing to eat after midnight.  Hydration: -May drink clear liquids until 2 hours before the procedure.  Approved liquids: Water , clear tea, black coffee, fruit juices-non-citric and without pulp,Gatorade, plain Jello/popsicles.   -Please drink 16 oz of water  2 hours before procedure.  Medication instructions: -Usual morning medications can be taken including aspirin  81 mg.  Plan to go home the same day, you will only stay overnight if medically necessary.  You must have responsible adult to drive you home.  Someone must be with you the first 24 hours after you arrive home.  Reviewed procedure instructions with patient.

## 2024-12-17 ENCOUNTER — Other Ambulatory Visit: Payer: Self-pay

## 2024-12-17 ENCOUNTER — Ambulatory Visit (HOSPITAL_COMMUNITY)
Admission: RE | Disposition: A | Discharge status: Home / Self Care | Payer: Self-pay | Source: Home / Self Care | Attending: Cardiovascular Disease

## 2024-12-17 ENCOUNTER — Other Ambulatory Visit (HOSPITAL_COMMUNITY): Payer: Self-pay

## 2024-12-17 ENCOUNTER — Ambulatory Visit (HOSPITAL_COMMUNITY)
Admission: RE | Admit: 2024-12-17 | Discharge: 2024-12-17 | Disposition: A | Discharge status: Home / Self Care | Attending: Cardiovascular Disease | Admitting: Cardiovascular Disease

## 2024-12-17 DIAGNOSIS — Z952 Presence of prosthetic heart valve: Secondary | ICD-10-CM | POA: Insufficient documentation

## 2024-12-17 DIAGNOSIS — I200 Unstable angina: Secondary | ICD-10-CM

## 2024-12-17 DIAGNOSIS — J45909 Unspecified asthma, uncomplicated: Secondary | ICD-10-CM | POA: Diagnosis not present

## 2024-12-17 DIAGNOSIS — Z79899 Other long term (current) drug therapy: Secondary | ICD-10-CM | POA: Insufficient documentation

## 2024-12-17 DIAGNOSIS — Z7902 Long term (current) use of antithrombotics/antiplatelets: Secondary | ICD-10-CM | POA: Diagnosis not present

## 2024-12-17 DIAGNOSIS — I2510 Atherosclerotic heart disease of native coronary artery without angina pectoris: Secondary | ICD-10-CM | POA: Diagnosis present

## 2024-12-17 DIAGNOSIS — Z8673 Personal history of transient ischemic attack (TIA), and cerebral infarction without residual deficits: Secondary | ICD-10-CM | POA: Insufficient documentation

## 2024-12-17 DIAGNOSIS — E039 Hypothyroidism, unspecified: Secondary | ICD-10-CM | POA: Diagnosis not present

## 2024-12-17 DIAGNOSIS — I252 Old myocardial infarction: Secondary | ICD-10-CM | POA: Insufficient documentation

## 2024-12-17 DIAGNOSIS — I2582 Chronic total occlusion of coronary artery: Secondary | ICD-10-CM | POA: Insufficient documentation

## 2024-12-17 DIAGNOSIS — R9439 Abnormal result of other cardiovascular function study: Secondary | ICD-10-CM | POA: Diagnosis present

## 2024-12-17 DIAGNOSIS — E785 Hyperlipidemia, unspecified: Secondary | ICD-10-CM | POA: Diagnosis not present

## 2024-12-17 DIAGNOSIS — Z955 Presence of coronary angioplasty implant and graft: Secondary | ICD-10-CM

## 2024-12-17 DIAGNOSIS — I272 Pulmonary hypertension, unspecified: Secondary | ICD-10-CM | POA: Diagnosis not present

## 2024-12-17 DIAGNOSIS — I25110 Atherosclerotic heart disease of native coronary artery with unstable angina pectoris: Secondary | ICD-10-CM | POA: Diagnosis not present

## 2024-12-17 DIAGNOSIS — Z7982 Long term (current) use of aspirin: Secondary | ICD-10-CM | POA: Diagnosis not present

## 2024-12-17 DIAGNOSIS — Z951 Presence of aortocoronary bypass graft: Secondary | ICD-10-CM | POA: Insufficient documentation

## 2024-12-17 LAB — POCT ACTIVATED CLOTTING TIME
Activated Clotting Time: 250 s
Activated Clotting Time: 261 s

## 2024-12-17 MED ORDER — FENTANYL CITRATE (PF) 100 MCG/2ML IJ SOLN
INTRAMUSCULAR | Status: AC
  Filled 2024-12-17: qty 2

## 2024-12-17 MED ORDER — SODIUM CHLORIDE 0.9% FLUSH: 3.0000 mL | Freq: Two times a day (BID) | INTRAVENOUS | Status: DC

## 2024-12-17 MED ORDER — CLOPIDOGREL BISULFATE 75 MG PO TABS: 75.0000 mg | ORAL_TABLET | Freq: Every day | ORAL | 1 refills | Status: DC

## 2024-12-17 MED ORDER — NITROGLYCERIN 1 MG/10 ML FOR IR/CATH LAB
INTRA_ARTERIAL | Status: AC
  Filled 2024-12-17: qty 10

## 2024-12-17 MED ORDER — NITROGLYCERIN 1 MG/10 ML FOR IR/CATH LAB
INTRA_ARTERIAL | Status: DC | PRN
  Administered 2024-12-17: 150 ug via INTRACORONARY
  Administered 2024-12-17: 200 ug via INTRACORONARY

## 2024-12-17 MED ORDER — IOHEXOL 350 MG/ML SOLN
INTRAVENOUS | Status: DC | PRN
  Administered 2024-12-17: 175 mL

## 2024-12-17 MED ORDER — CLOPIDOGREL BISULFATE 300 MG PO TABS
ORAL_TABLET | ORAL | Status: DC | PRN
  Administered 2024-12-17: 600 mg via ORAL

## 2024-12-17 MED ORDER — CLOPIDOGREL BISULFATE 75 MG PO TABS
75.0000 mg | ORAL_TABLET | Freq: Every day | ORAL | 1 refills | Status: AC
  Filled 2024-12-17: qty 90, 90d supply, fill #0

## 2024-12-17 MED ORDER — SODIUM CHLORIDE 0.9 % IV SOLN: 250.0000 mL | INTRAVENOUS | Status: DC | PRN

## 2024-12-17 MED ORDER — HEPARIN SODIUM (PORCINE) 1000 UNIT/ML IJ SOLN
INTRAMUSCULAR | Status: DC | PRN
  Administered 2024-12-17: 5000 [IU] via INTRAVENOUS
  Administered 2024-12-17 (×2): 4000 [IU] via INTRAVENOUS

## 2024-12-17 MED ORDER — LIDOCAINE HCL (PF) 1 % IJ SOLN
INTRAMUSCULAR | Status: AC
  Filled 2024-12-17: qty 30

## 2024-12-17 MED ORDER — FREE WATER: 500.0000 mL | Freq: Once | Status: DC

## 2024-12-17 MED ORDER — FENTANYL CITRATE (PF) 100 MCG/2ML IJ SOLN
INTRAMUSCULAR | Status: DC | PRN
  Administered 2024-12-17: 25 ug via INTRAVENOUS

## 2024-12-17 MED ORDER — ASPIRIN 81 MG PO CHEW: 81.0000 mg | CHEWABLE_TABLET | ORAL | Status: DC

## 2024-12-17 MED ORDER — MIDAZOLAM HCL 2 MG/2ML IJ SOLN
INTRAMUSCULAR | Status: AC
  Filled 2024-12-17: qty 2

## 2024-12-17 MED ORDER — CLOPIDOGREL BISULFATE 300 MG PO TABS
ORAL_TABLET | ORAL | Status: AC
  Filled 2024-12-17: qty 2

## 2024-12-17 MED ORDER — SODIUM CHLORIDE 0.9% FLUSH: 3.0000 mL | INTRAVENOUS | Status: DC | PRN

## 2024-12-17 MED ORDER — VERAPAMIL HCL 2.5 MG/ML IV SOLN
INTRAVENOUS | Status: AC
  Filled 2024-12-17: qty 2

## 2024-12-17 MED ORDER — VERAPAMIL HCL 2.5 MG/ML IV SOLN
INTRAVENOUS | Status: DC | PRN
  Administered 2024-12-17: 10 mL via INTRA_ARTERIAL

## 2024-12-17 MED ORDER — NITROGLYCERIN 0.4 MG SL SUBL: 0.4000 mg | SUBLINGUAL_TABLET | SUBLINGUAL | 2 refills | Status: DC | PRN

## 2024-12-17 MED ORDER — CLOPIDOGREL BISULFATE 75 MG PO TABS: 75.0000 mg | ORAL_TABLET | Freq: Every day | ORAL | 0 refills | Status: DC

## 2024-12-17 MED ORDER — NITROGLYCERIN 0.4 MG SL SUBL
0.4000 mg | SUBLINGUAL_TABLET | SUBLINGUAL | 2 refills | Status: AC | PRN
  Filled 2024-12-17: qty 25, 5d supply, fill #0

## 2024-12-17 MED ORDER — ACETAMINOPHEN 325 MG PO TABS: 650.0000 mg | ORAL_TABLET | ORAL | Status: DC | PRN

## 2024-12-17 MED ORDER — HEPARIN SODIUM (PORCINE) 1000 UNIT/ML IJ SOLN
INTRAMUSCULAR | Status: AC
  Filled 2024-12-17: qty 10

## 2024-12-17 MED ORDER — CLOPIDOGREL BISULFATE 75 MG PO TABS: 75.0000 mg | ORAL_TABLET | Freq: Every day | ORAL | Status: DC

## 2024-12-17 MED ORDER — MIDAZOLAM HCL (PF) 2 MG/2ML IJ SOLN
INTRAMUSCULAR | Status: DC | PRN
  Administered 2024-12-17: 2 mg via INTRAVENOUS

## 2024-12-17 MED ORDER — HYDRALAZINE HCL 20 MG/ML IJ SOLN: 10.0000 mg | INTRAMUSCULAR | Status: DC | PRN

## 2024-12-17 MED ORDER — ONDANSETRON HCL 4 MG/2ML IJ SOLN: 4.0000 mg | Freq: Four times a day (QID) | INTRAMUSCULAR | Status: DC | PRN

## 2024-12-17 MED ORDER — HEPARIN (PORCINE) IN NACL 1000-0.9 UT/500ML-% IV SOLN
INTRAVENOUS | Status: DC | PRN
  Administered 2024-12-17 (×2): 500 mL

## 2024-12-17 NOTE — Interval H&P Note (Signed)
 History and Physical Interval Note:  12/17/2024 10:22 AM  Jeffrey Tucker  has presented today for surgery, with the diagnosis of Abnormal stress test.  The various methods of treatment have been discussed with the patient and family. After consideration of risks, benefits and other options for treatment, the patient has consented to  Procedures: LEFT HEART CATH AND CORS/GRAFTS ANGIOGRAPHY (N/A) as a surgical intervention.  The patient's history has been reviewed, patient examined, no change in status, stable for surgery.  I have reviewed the patient's chart and labs.  Questions were answered to the patient's satisfaction.     [name]

## 2024-12-17 NOTE — Discharge Summary (Signed)
 "   Discharge Summary for Same Day PCI   Patient ID: Jeffrey Tucker MRN: [id number]; DOB: [birth year]  Admit date: 12/17/2024 Discharge date: 12/17/2024  Primary Care Provider: Rilla Baller, MD  Primary Cardiologist: Evalene Lunger, MD  Primary Electrophysiologist:  None   Discharge Diagnoses    Active Problems:   Coronary atherosclerosis of native coronary artery   Diagnostic Studies/Procedures    Cardiac Catheterization 12/17/2024:    Prox LAD to Mid LAD lesion is 40% stenosed.   1.  Patent left main with no stenosis 2.  Patent LAD with mild nonobstructive plaquing and no significant stenosis 3.  Severe mid circumflex stenosis of 90%, treated with a 2.75 x 28 mm Synergy DES, reducing 90% stenosis to 0% post PCI 4.  Chronic occlusion of the distal RCA with left-to-right collaterals supplying the PDA and PLA branches 5.  Normal LVEF by echo assessment   Recommendations: Same-day PCI protocol.  Clopidogrel  75 mg daily x 6 months (stable ischemic heart disease recommendation).  Aspirin  81 mg daily lifelong.  Consider CTO intervention of the RCA if persistent ischemic symptoms.  Diagnostic Dominance: Right  Intervention   _____________   History of Present Illness     Jeffrey Tucker is a 69 y.o. male with PMH of CAD s/p 2v CABG (SVG to PDA, SVG to OM branch of LCx) and Bentall c/b hemapericardium and ligation of both SVG shortly afterwards, pulmonary HTN, HLD, asthma, hypothyroidism, TIA who is followed by Dr. Gollan as an outpatient.   Echo in July 2020 demonstrated progression of his structural cardiovascular disease with EF of 50 to 55% (prior echo in 2018 demonstrated EF > 55%), moderate to severe aortic valve regurgitation, dilated aortic root 4.9 cm, ascending aortic measuring 4.4 cm, aortic arch measuring 3.7 cm.  Given these results, he underwent CTA of the aorta in August 2020 which demonstrated aneurysmal disease of the thoracic aorta with aortic root measuring up  to 5 cm at the level of the sinuses of Valsalva, ascending thoracic aorta measuring 4.3 cm in its greatest diameter.  CTA also demonstrated coronary artery calcification in LAD, LCx, and RCA distributions. R/LHC was performed in September 2020 and demonstrated two-vessel CAD hemodynamic findings consistent with moderate pulmonary hypertension.  He underwent consultation with CTS with subsequent CABG x 2 along with AVR/aortic root replacement on 08/27/2019.  Postop course was notable for expected acute blood loss anemia requiring 2 units PRBC, volume overload requiring diuresis, subsequent desaturation with follow-up chest x-ray showed bilateral pleural effusions L>R requiring left thoracentesis with dark fluid removed.   Presented back 09/25/2019 via EMS unresponsive with STEMI on EKG. Patient's wife reported she found unresponsive and began CPR. EMS reported patient obtunded with episodes of apnea. CT surgery requested emergent CTA. CTA demonstrated changes consistent with active aortic extravasation with enlarging pericardial effusion, extravasation occurs adjacent to the origin of the reimplanted coronary artery which is somewhat diminutive on current exam, it was previously coursed along the posterior interventricular septum and may represent breakdown of the reimplantation site, right upper lobe pulmonary arterial emboli new from prior study, left lower lobe consolidation with smaller areas of atelectasis in the right lower lobe and left upper lobe, pseudoaneurysm arising from the right common femoral artery that is new from prior exam from March 2020. Patient was taken to the OR emergently for evacuation of mediastinal hematoma, ligation of SVG grafts x 2 and emergent redo median sternotomy.   Patient complained of right groin pain and underwent vascular  ultrasound of the lower extremity and was found to have 2.4 x 1.7 cm area off of the right common femoral artery with ultrasound characteristics of a  pseudoaneurysm. It was felt aneurysm was most likely present since catheterization 6 weeks prior reducing likelihood of spontaneous closure. Patient subsequently underwent open repair of right common femoral artery pseudoaneurysm with vascular surgery on 10/29/2019.   He has been seen back in the office intermittently since 10/2024 with complaints of chest pain with increased activity. He underwent nuclear stress test which was an abnormal, intermediate risk study showing a new inferior defect nearly completely reversible. Seen back in the office on 12/07/2024 with [name] and scheduled for outpatient cardiac cath.   Hospital Course     The patient underwent cardiac cath as noted above with severe Lcx stenosis of 90% treated with PCI/DES x1, CTO of dRCA. Plan for DAPT with ASA/plavix  for at least 6 months.  Could consider CTO intervention of the RCA if he continues to have symptoms. The patient was seen by cardiac rehab while in short stay. There were no observed complications post cath. Radial cath site was re-evaluated prior to discharge and found to be stable without any complications. Instructions/precautions regarding cath site care were given prior to discharge.  Jeffrey Tucker was seen by Dr. Wonda and determined stable for discharge home. Follow up with our office has been arranged. Medications are listed below. Pertinent changes include addition of plavix  and SL NTG.  _____________  Cath/PCI Registry Performance & Quality Measures: Aspirin  prescribed? - Yes ADP Receptor Inhibitor (Plavix /Clopidogrel , Brilinta/Ticagrelor or Effient/Prasugrel) prescribed (includes medically managed patients)? - Yes High Intensity Statin (Lipitor  40-80mg  or Crestor  20-40mg ) prescribed? - Yes For EF <40%, was ACEI/ARB prescribed? - Not Applicable (EF >/= 40%) For EF <40%, Aldosterone Antagonist (Spironolactone or Eplerenone) prescribed? - Not Applicable (EF >/= 40%) Cardiac Rehab Phase II ordered  (Included Medically managed Patients)? - Yes  _____________   Discharge Vitals Blood pressure 132/73, pulse (!) 59, temperature (!) 97.5 F (36.4 C), temperature source Oral, resp. rate 12, height 5' 5 (1.651 m), weight 76.2 kg, SpO2 98%.  Filed Weights   12/17/24 0857  Weight: 76.2 kg    Last Labs & Radiologic Studies    CBC No results for input(s): WBC, NEUTROABS, HGB, HCT, MCV, PLT in the last 72 hours. Basic Metabolic Panel No results for input(s): NA, K, CL, CO2, GLUCOSE, BUN, CREATININE, CALCIUM , MG, PHOS in the last 72 hours. Liver Function Tests No results for input(s): AST, ALT, ALKPHOS, BILITOT, PROT, ALBUMIN  in the last 72 hours. No results for input(s): LIPASE, AMYLASE in the last 72 hours. High Sensitivity Troponin:   No results for input(s): TROPONINIHS in the last 720 hours.  BNP Invalid input(s): POCBNP D-Dimer No results for input(s): DDIMER in the last 72 hours. Hemoglobin A1C No results for input(s): HGBA1C in the last 72 hours. Fasting Lipid Panel No results for input(s): CHOL, HDL, LDLCALC, TRIG, CHOLHDL, LDLDIRECT in the last 72 hours. Thyroid  Function Tests No results for input(s): TSH, T4TOTAL, T3FREE, THYROIDAB in the last 72 hours.  Invalid input(s): FREET3 _____________  CARDIAC CATHETERIZATION Result Date: 12/17/2024   Prox LAD to Mid LAD lesion is 40% stenosed. 1.  Patent left main with no stenosis 2.  Patent LAD with mild nonobstructive plaquing and no significant stenosis 3.  Severe mid circumflex stenosis of 90%, treated with a 2.75 x 28 mm Synergy DES, reducing 90% stenosis to 0% post PCI 4.  Chronic occlusion  of the distal RCA with left-to-right collaterals supplying the PDA and PLA branches 5.  Normal LVEF by echo assessment Recommendations: Same-day PCI protocol.  Clopidogrel  75 mg daily x 6 months (stable ischemic heart disease recommendation).  Aspirin  81 mg daily  lifelong.  Consider CTO intervention of the RCA if persistent ischemic symptoms.   ECHOCARDIOGRAM COMPLETE Result Date: 11/30/2024    ECHOCARDIOGRAM REPORT   Patient Name:   Jeffrey Tucker Date of Exam: 11/30/2024 Medical Rec #:  [id number]      Height:       65.7 in Accession #:    [id number]     Weight:       176.0 lb Date of Birth:  [birth year]      BSA:          1.889 m Patient Age:    68 years       BP:           140/72 mmHg Patient Gender: M              HR:           51 bpm. Exam Location:  [facility] Procedure: 2D Echo, 3D Echo, Cardiac Doppler, Color Doppler and Strain Analysis            (Both Spectral and Color Flow Doppler were utilized during            procedure). Indications:    Q23.1 Bicuspid aortic valve  History:        Patient has prior history of Echocardiogram examinations. CAD,                 Prior CABG, TIA, Aortic Valve Disease; Risk Factors:Hypertension                 and Dyslipidemia. 27mm Konect Resilia valve in the aortic                 position.  Sonographer:    Marshall Benders Referring Phys: [phone] EVALENE JINNY LUNGER IMPRESSIONS  1. Left ventricular ejection fraction, by estimation, is 60 to 65%. Left ventricular ejection fraction by 3D volume is 63 %. The left ventricle has normal function. The left ventricle has no regional wall motion abnormalities. There is moderate left ventricular hypertrophy. Left ventricular diastolic parameters are consistent with Grade I diastolic dysfunction (impaired relaxation). The average left ventricular global longitudinal strain is -17.8 %. The global longitudinal strain is normal.  2. Right ventricular systolic function is normal. The right ventricular size is normal. Mildly increased right ventricular wall thickness. There is normal pulmonary artery systolic pressure. The estimated right ventricular systolic pressure is 17.1 mmHg.  3. The mitral valve is normal in structure. Mild mitral valve regurgitation. No evidence of mitral stenosis.  4. The aortic  valve has been repaired/replaced. Aortic valve regurgitation is not visualized. No aortic stenosis is present. Procedure Date: 08/27/19. Aortic valve mean gradient measures 6.0 mmHg.  5. The inferior vena cava is normal in size with greater than 50% respiratory variability, suggesting right atrial pressure of 3 mmHg. Comparison(s): 08/09/2023 EF 55-60%. FINDINGS  Left Ventricle: Left ventricular ejection fraction, by estimation, is 60 to 65%. Left ventricular ejection fraction by 3D volume is 63 %. The left ventricle has normal function. The left ventricle has no regional wall motion abnormalities. The average left ventricular global longitudinal strain is -17.8 %. Strain was performed and the global longitudinal strain is normal. The left ventricular internal cavity size was normal in size.  There is moderate left ventricular hypertrophy. Left ventricular diastolic parameters are consistent with Grade I diastolic dysfunction (impaired relaxation). Right Ventricle: The right ventricular size is normal. Mildly increased right ventricular wall thickness. Right ventricular systolic function is normal. There is normal pulmonary artery systolic pressure. The tricuspid regurgitant velocity is 1.74 m/s, and with an assumed right atrial pressure of 5 mmHg, the estimated right ventricular systolic pressure is 17.1 mmHg. Left Atrium: Left atrial size was normal in size. Right Atrium: Right atrial size was normal in size. Pericardium: There is no evidence of pericardial effusion. Mitral Valve: The mitral valve is normal in structure. Mild mitral valve regurgitation. No evidence of mitral valve stenosis. Tricuspid Valve: The tricuspid valve is normal in structure. Tricuspid valve regurgitation is not demonstrated. No evidence of tricuspid stenosis. Aortic Valve: The aortic valve has been repaired/replaced. Aortic valve regurgitation is not visualized. No aortic stenosis is present. Aortic valve mean gradient measures 6.0 mmHg.  Aortic valve peak gradient measures 11.2 mmHg. Aortic valve area, by VTI  measures 2.54 cm. There is a bioprosthetic valve present in the aortic position. Pulmonic Valve: The pulmonic valve was normal in structure. Pulmonic valve regurgitation is not visualized. No evidence of pulmonic stenosis. Aorta: The aortic root is normal in size and structure. Venous: The inferior vena cava is normal in size with greater than 50% respiratory variability, suggesting right atrial pressure of 3 mmHg. IAS/Shunts: No atrial level shunt detected by color flow Doppler. Additional Comments: 3D was performed not requiring image post processing on an independent workstation and was normal.  LEFT VENTRICLE PLAX 2D LVIDd:         4.20 cm         Diastology LVIDs:         3.15 cm         LV e' medial:    3.92 cm/s LV PW:         1.30 cm         LV E/e' medial:  26.0 LV IVS:        1.15 cm         LV e' lateral:   11.40 cm/s LVOT diam:     2.00 cm         LV E/e' lateral: 8.9 LV SV:         96 LV SV Index:   51              2D Longitudinal LVOT Area:     3.14 cm        Strain                                2D Strain GLS   -17.8 %                                Avg:                                 3D Volume EF                                LV 3D EF:    Left  ventricul                                             ar                                             ejection                                             fraction                                             by 3D                                             volume is                                             63 %.                                 3D Volume EF:                                3D EF:        63 %                                LV EDV:       128 ml                                LV ESV:       47 ml                                LV SV:        81 ml RIGHT VENTRICLE            IVC RV Basal diam:  3.30 cm    IVC diam: 1.50 cm RV Mid  diam:    3.00 cm RV S prime:     6.20 cm/s TAPSE (M-mode): 1.6 cm LEFT ATRIUM             Index        RIGHT ATRIUM           Index LA diam:        4.55 cm 2.41 cm/m   RA Area:     16.70 cm LA Vol (A2C):   51.4 ml 27.21 ml/m  RA Volume:   43.60 ml  23.08  ml/m LA Vol (A4C):   49.3 ml 26.10 ml/m LA Biplane Vol: 51.0 ml 27.00 ml/m  AORTIC VALVE AV Area (Vmax):    2.82 cm AV Area (Vmean):   2.82 cm AV Area (VTI):     2.54 cm AV Vmax:           167.00 cm/s AV Vmean:          107.000 cm/s AV VTI:            0.379 m AV Peak Grad:      11.2 mmHg AV Mean Grad:      6.0 mmHg LVOT Vmax:         150.00 cm/s LVOT Vmean:        95.900 cm/s LVOT VTI:          0.306 m LVOT/AV VTI ratio: 0.81  AORTA Ao Root diam: 3.30 cm Ao Asc diam:  3.70 cm MITRAL VALVE                TRICUSPID VALVE MV Area (PHT): 3.12 cm     TR Peak grad:   12.1 mmHg MV Decel Time: 243 msec     TR Vmax:        174.00 cm/s MV E velocity: 102.00 cm/s MV A velocity: 103.00 cm/s  SHUNTS MV E/A ratio:  0.99         Systemic VTI:  0.31 m                             Systemic Diam: 2.00 cm Evalene Lunger MD Electronically signed by Evalene Lunger MD Signature Date/Time: 11/30/2024/1:47:47 PM    Final     Disposition   Pt is being discharged home today in good condition.  Follow-up Plans & Appointments     Discharge Instructions     AMB Referral to Cardiac Rehabilitation - Phase II   Complete by: As directed    Diagnosis: Coronary Stents   After initial evaluation and assessments completed: Virtual Based Care may be provided alone or in conjunction with Phase 2 Cardiac Rehab based on patient barriers.: Yes   Intensive Cardiac Rehabilitation (ICR) [facility] location only OR Traditional Cardiac Rehabilitation (TCR) *If criteria for ICR are not met will enroll in TCR [facility] only): Yes        Discharge Medications   Allergies as of 12/17/2024       Reactions   Tetracycline Hcl Other (See Comments)   Joint pain (and stiffened them, also)   Beta  Adrenergic Blockers Other (See Comments)   Patient experienced abdomen vasospasms with Metoprolol  succinate/tartrate and carvedilol .   Metoprolol  Tartrate    Asthma and cough   Amlodipine  Other (See Comments)   10mg  dose causes lower extremity swelling. Pt can tolerate 5mg  dose.        Medication List     TAKE these medications    albuterol  108 (90 Base) MCG/ACT inhaler Commonly known as: VENTOLIN  HFA Inhale 2 puffs into the lungs 3 (three) times daily as needed for wheezing or shortness of breath.   aspirin  EC 81 MG tablet Daily in the evening   atorvastatin  40 MG tablet Commonly known as: LIPITOR  TAKE 1 TABLET BY MOUTH DAILY   clopidogrel  75 MG tablet Commonly known as: Plavix  Take 1 tablet (75 mg total) by mouth daily.   cyanocobalamin  1000 MCG tablet Commonly known as: VITAMIN B12 Take 0.5 tablets (500 mcg total) by mouth daily.  ezetimibe  10 MG tablet Commonly known as: ZETIA  Take 1 tablet (10 mg total) by mouth daily.   fluticasone -salmeterol 100-50 MCG/ACT Aepb Commonly known as: ADVAIR Inhale 1 puff into the lungs 2 (two) times daily.   ketoconazole  2 % shampoo Commonly known as: NIZORAL  Massage into scalp 1-2 times per week, let sit several minutes before rinsing For seborrheic dermatitis   levocetirizine 5 MG tablet Commonly known as: XYZAL Take 5 mg by mouth every evening. What changed:  when to take this additional instructions   levothyroxine  125 MCG tablet Commonly known as: SYNTHROID  TAKE 1 TABLET BY MOUTH DAILY   mometasone  0.1 % lotion Commonly known as: ELOCON  APPLY TOPICALLY 2 TIMES A WEEK for seborrheic dermatitis   montelukast  10 MG tablet Commonly known as: SINGULAIR  Take 10 mg by mouth at bedtime.   nitroGLYCERIN  0.4 MG SL tablet Commonly known as: Nitrostat  Place 1 tablet (0.4 mg total) under the tongue every 5 (five) minutes as needed.   UNABLE TO FIND Med Name: Allergy Shots weekly   valACYclovir  1000 MG  tablet Commonly known as: VALTREX  Take 2 tablets (2,000 mg total) by mouth 2 (two) times daily. For 1 day as needed for cold sores         Allergies Allergies[1]  Outstanding Labs/Studies   N/a   Duration of Discharge Encounter   Greater than 30 minutes including physician time.  Signed, Manuelita Rummer, NP 12/17/2024, 1:24 PM     [1]  Allergies Allergen Reactions   Tetracycline Hcl Other (See Comments)    Joint pain (and stiffened them, also)   Beta Adrenergic Blockers Other (See Comments)    Patient experienced abdomen vasospasms with Metoprolol  succinate/tartrate and carvedilol .   Metoprolol  Tartrate     Asthma and cough   Amlodipine  Other (See Comments)    10mg  dose causes lower extremity swelling. Pt can tolerate 5mg  dose.   "

## 2024-12-17 NOTE — Discharge Instructions (Signed)

## 2024-12-17 NOTE — Progress Notes (Signed)
 Tr band removed, gauze dressing applied. Right radial level 0, clean, dry, and intact.

## 2024-12-17 NOTE — Progress Notes (Signed)
 CARDIAC REHAB PHASE I     Post stent education including site care, restrictions, risk factors, exercise guidelines, NTG use, antiplatelet therapy importance, heart healthy diet, and CRP2 reviewed. All questions and concerns addressed. Will refer to  for CRP2. Plan for home later today.    1:20-1:41 Isaiah JAYSON Liverpool, RN BSN 12/17/2024 1:41 PM

## 2024-12-18 ENCOUNTER — Encounter (HOSPITAL_COMMUNITY): Payer: Self-pay | Admitting: Cardiovascular Disease

## 2024-12-18 MED FILL — Lidocaine HCl Local Preservative Free (PF) Inj 1%: INTRAMUSCULAR | Qty: 30 | Status: AC

## 2024-12-19 ENCOUNTER — Other Ambulatory Visit: Payer: Self-pay | Admitting: Cardiovascular Disease

## 2024-12-19 DIAGNOSIS — I351 Nonrheumatic aortic (valve) insufficiency: Secondary | ICD-10-CM

## 2024-12-19 DIAGNOSIS — Z953 Presence of xenogenic heart valve: Secondary | ICD-10-CM

## 2024-12-19 DIAGNOSIS — I7121 Aneurysm of the ascending aorta, without rupture: Secondary | ICD-10-CM

## 2024-12-19 DIAGNOSIS — I25118 Atherosclerotic heart disease of native coronary artery with other forms of angina pectoris: Secondary | ICD-10-CM

## 2024-12-19 DIAGNOSIS — I480 Paroxysmal atrial fibrillation: Secondary | ICD-10-CM

## 2024-12-19 DIAGNOSIS — I724 Aneurysm of artery of lower extremity: Secondary | ICD-10-CM

## 2024-12-22 ENCOUNTER — Encounter: Payer: Self-pay | Admitting: Cardiovascular Disease

## 2024-12-31 NOTE — Progress Notes (Unsigned)
 "  Cardiology Clinic Note   Date: 12/31/2024 ID: CARLOUS Tucker, DOB [birth year], MRN [id number]  Primary Cardiologist:  Evalene Lunger, MD  Chief Complaint   Jeffrey Tucker is a 69 y.o. male who presents to the clinic today for ***  Patient Profile   Jeffrey Tucker is followed by *** for the history outlined below.       Past medical history significant for: CAD. R/LHC 08/21/2019: Proximal mid LCx 80%.  Mid LCx 80%.  Distal RCA 70%.  RPDA 70%. CABG x 2 08/27/2019: SVG to PDA, SVG to OM branch of LCx. Mediastinal hematoma evacuation, ligation of SVG grafts x 2 09/25/2019. Nuclear stress test 11/03/2024: Intermediate risk/abnormal pharmacological myocardial perfusion stress test.  There is a moderate-sized, mild in severity nearly completely reversible inferior wall defect consistent with ischemia.  Small area of scar.  Ischemia cannot be excluded.  LV systolic function mildly reduced 44%.  There is evidence of prior CABG as well as AVR aortic root repair.  Compared to prior study January 2021 inferior defect is new. LHC 12/17/2024: Patent LM with no stenosis.  Patent LAD with mild nonobstructive plaquing and no significant stenosis.  Severe mid LCx stenosis 90%.  CTO distal RCA with left-to-right collaterals supplying the PDA and PLA branches.  PCI with DES 2.75 x 28 mm to mid LCx. Pulmonary hypertension. R/LHC 08/21/2019: Hemodynamic findings consistent with moderate pulmonary hypertension. Bicuspid aortic valve/Thoracic aortic aneurysm without rupture. AVR/aortic root replacement 08/27/2019: 27 AS connect Resilia bioprosthetic valve.  Distal hemiarch reconstruction with 30 mm Dacron graft. Echo 11/30/2024: EF 60 to 65%.  No RWMA.  Moderate LVH.  Grade I DD.  Normal global strain.  Normal RV size/function.  Increased right ventricular wall thickness.  Normal PA pressure, RVSP 70.1 mmHg.  No AI/AS mean gradient 6 mmHg. Hyperlipidemia. Lipid panel 06/15/2024: LDL 64, HDL 28, TG 299, total  152. Asthma. Hypothyroidism. TIA.  In summary, patient was noted to have a murmur on exam 2012 with subsequent echo showing EF 50 to 55%, mild LVH, normal diastolic function, images concerning for bicuspid aortic valve with severe regurgitation, mild to moderately dilated aortic root, mild to moderately dilated ascending aorta, mild MR.  Over the years he was monitored with periodic echoes.  Echo in July 2020 demonstrated progression of his structural cardiovascular disease with EF of 50 to 55% (prior echo in 2018 demonstrated EF > 55%), moderate to severe aortic valve regurgitation, dilated aortic root 4.9 cm, ascending aortic measuring 4.4 cm, aortic arch measuring 3.7 cm.  Given these results, he underwent CTA of the aorta in August 2020 which demonstrated aneurysmal disease of the thoracic aorta with aortic root measuring up to 5 cm at the level of the sinuses of Valsalva, ascending thoracic aorta measuring 4.3 cm in its greatest diameter.  CTA also demonstrated coronary artery calcification in LAD, LCx, and RCA distributions.    R/LHC was performed in September 2020 and demonstrated two-vessel CAD hemodynamic findings consistent with moderate pulmonary hypertension.  He underwent consultation with CTS with subsequent CABG x 2 along with AVR/aortic root replacement on 08/27/2019.  Postop course was notable for expected acute blood loss anemia requiring 2 units PRBC, volume overload requiring diuresis, subsequent desaturation with follow-up chest x-ray showed bilateral pleural effusions L>R requiring left thoracentesis with 6 mL of dark fluid removed.  Following this, patient reported left back and shoulder pain with follow-up chest x-ray showing no pneumothorax and basilar density on the left which was felt to  possibly be related to reexpansion or edema.  Pain persisted despite morphine  for pain control leading to CT of the chest which showed bilateral pleural effusions, atelectasis, stable cardiomegaly,  complex fluid throughout proximal large felt to likely reflect a small amount of hemorrhage which is felt to be postsurgical.  He was seen by CT surgery from a surgical perspective.  Postop x-ray demonstrated stable cardiac without pulmonary improved bibasilar atelectasis decreased right pleural effusion slightly increased small left pleural effusion.    Patient was seen by cardiology on 09/15/2019 for a several day history of involuntary abdominal spasms followed by cyclic dry cough pain with associated significant chest discomfort.  Symptoms worse when attempting to lay supine and improved with sitting up with both feet flat on the ground with episodes lasting approximately 60 seconds.  Patient denied issues from surgical site.  He noted mild lower extremity edema that was improving.  Patient shared he had developed a fever with the prior 24 hours with temperature of 102 on the day prior to 100.2 the morning of this visit post Tylenol .  He reported nasal congestion he attributed to that has been allergies.  It was felt spasmatic cough/could be exacerbated by recent starting of metoprolol .  After discussion with CT surgery it was agreed to discontinue Toprol , start trial of Tessalon  Perles, and swab patient for COVID.    Patient presented to the ED on 09/16/2019 for shortness of breath and central chest pain.  He also reported right lower leg increased edema.  Patient was admitted.  Inflammatory markers were elevated questionably related to surgery versus post cardiotomy syndrome.  Patient was started on colchicine .  Patient had 2 episodes of asymptomatic bradycardia with heart rate in the upper 40s.  Patient also complained of loose stool.  He was negative for C. difficile.  Patient was discharged on 09/24/2019.  Patient presented to the ED on 09/25/2019 via EMS unresponsive with STEMI on EKG.  Patient's wife reported she found unresponsive and began CPR.  EMS reported patient obtunded with episodes of  apnea.  CT surgery requested emergent CTA.  Patient was intubated and placed on mechanical ventilation.  CTA demonstrated changes consistent with active aortic extravasation with enlarging pericardial effusion, extravasation occurs adjacent to the origin of the reimplanted coronary artery which is somewhat diminutive on current exam, it was previously coursed along the posterior interventricular septum and may represent breakdown of the reimplantation site, right upper lobe pulmonary arterial emboli new from prior study, left lower lobe consolidation with smaller areas of atelectasis in the right lower lobe and left upper lobe, pseudoaneurysm arising from the right common femoral artery that is new from prior exam from March 2020.  Patient was taken to the OR emergently for evacuation of mediastinal hematoma, ligation of SVG grafts x 2 and emergent redo median sternotomy.  Patient complained of right groin pain and underwent vascular ultrasound of the lower extremity and was found to have 2.4 x 1.7 cm area off of the right common femoral artery with ultrasound characteristics of a pseudoaneurysm.  It was felt aneurysm was most likely present since catheterization 6 weeks prior reducing likelihood of spontaneous closure.  Patient subsequently underwent open repair of right common femoral artery pseudoaneurysm with vascular surgery on 10/29/2019.  Patient was evaluated in the clinic on 10/27/2024 for chest discomfort and shortness of breath when walking at a fast pace x 2 months.  He reported a slow onset of less energy, more dyspnea, and chest discomfort.  He underwent  nuclear stress test which was an abnormal, intermediate risk study showing a new inferior defect nearly completely reversible.  Upon follow-up on 11/10/2024 patient reported continued dyspnea on his daily walks.  He preferred having LHC performed in [place].  He wanted to research interventionalists before scheduling the procedure.  Patient was  last seen in the office by me on 12/07/2024 to set up heart catheterization.  He reported continued dyspnea when walking fast.  He was also experiencing decreased energy and activity tolerance.  He denied chest pain but felt he had an awareness across his chest that made him feel something was wrong.  He agreed to proceed with LHC on 1/22 with Dr. Wonda in [place].  Procedure was performed and he underwent PCI with DES to mid LCx.     History of Present Illness    Today, patient ***  CAD S/p CABG x 28 August 2019 with subsequent evacuation of mediastinal hematoma, ligation of SVG grafts x 2.  PCI with DES to mid LCx 12/17/2024.  Patient*** - Continue aspirin , atorvastatin , ezetimibe .   Bicuspid aortic valve/thoracic aortic aneurysm S/p AVR and aortic root replacement 08/27/2019.  Echo December 2025 demonstrated normal LV/RV function, no AI/AS, mean gradient 6 mmHg.  Patient denies lightheadedness, dizziness, presyncope or syncope. 2/6 systolic murmur on exam today.*** - Repeat echo December 2026. - SBE prophylaxis required.   Hyperlipidemia LDL 64 July 2025, at goal. - Continue atorvastatin  and ezetimibe .  ROS: All other systems reviewed and are otherwise negative except as noted in History of Present Illness.  EKGs/Labs Reviewed        10/16/2024: ALT 25; AST 21 11/30/2024: BUN 19; Creatinine, Ser 1.04; Potassium 4.2; Sodium 142   11/30/2024: Hemoglobin 14.6; WBC 5.9   06/15/2024: TSH 2.73   No results found for requested labs within last 365 days.  ***  Risk Assessment/Calculations    {Does this patient have ATRIAL FIBRILLATION?:[id number]} No BP recorded.  {Refresh Note OR Click here to enter BP  :1}***        Physical Exam    VS:  There were no vitals taken for this visit. , BMI There is no height or weight on file to calculate BMI.  GEN: Well nourished, well developed, in no acute distress. Neck: No JVD or carotid bruits. Cardiac: *** RRR. *** No murmur. No rubs or  gallops.   Respiratory:  Respirations regular and unlabored. Clear to auscultation without rales, wheezing or rhonchi. GI: Soft, nontender, nondistended. Extremities: Radials/DP/PT 2+ and equal bilaterally. No clubbing or cyanosis. No edema ***  Skin: Warm and dry, no rash. Neuro: Strength intact.  Assessment & Plan   ***  Disposition: ***  {The patient has an active order for outpatient cardiac rehabilitation.   Please indicate if the patient is ready to start. Do NOT delete this.  It will auto delete.  Refresh note, then sign.              Click here to document readiness and see contraindications.  :1}  Cardiac Rehabilitation Eligibility Assessment      {Are you ordering a CV Procedure (e.g. stress test, cath, DCCV, TEE, etc)?   Press F2        :[id number]}   Signed, Barnie HERO. Worthington Cruzan, DNP, NP-C  "

## 2025-01-05 ENCOUNTER — Ambulatory Visit: Admitting: Student

## 2025-02-08 ENCOUNTER — Ambulatory Visit: Admitting: Medical

## 2025-06-21 ENCOUNTER — Encounter: Admitting: Family Medicine

## 2025-07-22 ENCOUNTER — Ambulatory Visit

## 2025-07-26 ENCOUNTER — Ambulatory Visit

## 2025-10-06 ENCOUNTER — Ambulatory Visit: Admitting: Dermatology
